# Patient Record
Sex: Female | Born: 1940 | Race: White | Hispanic: No | Smoking: Former smoker
Health system: Southern US, Community
[De-identification: ages and names within clinical notes are randomized; demographics above are authoritative.]

## PROBLEM LIST (undated history)

## (undated) DIAGNOSIS — T7840XA Allergy, unspecified, initial encounter: Secondary | ICD-10-CM

## (undated) DIAGNOSIS — C801 Malignant (primary) neoplasm, unspecified: Secondary | ICD-10-CM

## (undated) DIAGNOSIS — IMO0001 Reserved for inherently not codable concepts without codable children: Secondary | ICD-10-CM

## (undated) DIAGNOSIS — R51 Headache: Secondary | ICD-10-CM

## (undated) DIAGNOSIS — T8859XA Other complications of anesthesia, initial encounter: Secondary | ICD-10-CM

## (undated) DIAGNOSIS — D369 Benign neoplasm, unspecified site: Secondary | ICD-10-CM

## (undated) DIAGNOSIS — D649 Anemia, unspecified: Secondary | ICD-10-CM

## (undated) DIAGNOSIS — F329 Major depressive disorder, single episode, unspecified: Secondary | ICD-10-CM

## (undated) DIAGNOSIS — K219 Gastro-esophageal reflux disease without esophagitis: Secondary | ICD-10-CM

## (undated) DIAGNOSIS — F419 Anxiety disorder, unspecified: Secondary | ICD-10-CM

## (undated) DIAGNOSIS — M48061 Spinal stenosis, lumbar region without neurogenic claudication: Secondary | ICD-10-CM

## (undated) DIAGNOSIS — I209 Angina pectoris, unspecified: Secondary | ICD-10-CM

## (undated) DIAGNOSIS — K279 Peptic ulcer, site unspecified, unspecified as acute or chronic, without hemorrhage or perforation: Secondary | ICD-10-CM

## (undated) DIAGNOSIS — E669 Obesity, unspecified: Secondary | ICD-10-CM

## (undated) DIAGNOSIS — B019 Varicella without complication: Secondary | ICD-10-CM

## (undated) DIAGNOSIS — M1990 Unspecified osteoarthritis, unspecified site: Secondary | ICD-10-CM

## (undated) DIAGNOSIS — I2510 Atherosclerotic heart disease of native coronary artery without angina pectoris: Secondary | ICD-10-CM

## (undated) DIAGNOSIS — E785 Hyperlipidemia, unspecified: Secondary | ICD-10-CM

## (undated) DIAGNOSIS — M797 Fibromyalgia: Secondary | ICD-10-CM

## (undated) DIAGNOSIS — G4730 Sleep apnea, unspecified: Secondary | ICD-10-CM

## (undated) DIAGNOSIS — R519 Headache, unspecified: Secondary | ICD-10-CM

## (undated) DIAGNOSIS — I10 Essential (primary) hypertension: Secondary | ICD-10-CM

## (undated) DIAGNOSIS — S2239XA Fracture of one rib, unspecified side, initial encounter for closed fracture: Secondary | ICD-10-CM

## (undated) DIAGNOSIS — K649 Unspecified hemorrhoids: Secondary | ICD-10-CM

## (undated) DIAGNOSIS — T4145XA Adverse effect of unspecified anesthetic, initial encounter: Secondary | ICD-10-CM

## (undated) DIAGNOSIS — F32A Depression, unspecified: Secondary | ICD-10-CM

## (undated) DIAGNOSIS — I509 Heart failure, unspecified: Secondary | ICD-10-CM

## (undated) DIAGNOSIS — L309 Dermatitis, unspecified: Secondary | ICD-10-CM

## (undated) DIAGNOSIS — R42 Dizziness and giddiness: Secondary | ICD-10-CM

## (undated) DIAGNOSIS — E559 Vitamin D deficiency, unspecified: Secondary | ICD-10-CM

## (undated) DIAGNOSIS — E039 Hypothyroidism, unspecified: Secondary | ICD-10-CM

## (undated) DIAGNOSIS — Z8719 Personal history of other diseases of the digestive system: Secondary | ICD-10-CM

## (undated) HISTORY — PX: CHOLECYSTECTOMY: SHX55

## (undated) HISTORY — PX: EYE SURGERY: SHX253

## (undated) HISTORY — PX: ABDOMINAL HYSTERECTOMY: SHX81

## (undated) HISTORY — DX: Fracture of one rib, unspecified side, initial encounter for closed fracture: S22.39XA

## (undated) HISTORY — PX: RECTOCELE REPAIR: SHX761

## (undated) NOTE — *Deleted (*Deleted)
[place] MEDICAID FL2 LEVEL OF CARE SCREENING TOOL     IDENTIFICATION  Patient Name: Stacy Allen Birthdate: Dec 24, 1940 Sex: female Admission Date (Current Location): 05/06/2020  [place] and [place] Number:  Chiropodist and Address:  [facility], [address], [place], [place] [address]      Provider Number: [id number]  Attending Physician Name and Address:  Lynn Ito, MD  Relative Name and Phone Number:  Victorino Sparrow (Niece) [phone]    Current Level of Care: Hospital Recommended Level of Care: Skilled Nursing Facility Prior Approval Number:    Date Approved/Denied:   PASRR Number:    Discharge Plan: SNF    Current Diagnoses: Patient Active Problem List   Diagnosis Date Noted  . Closed right hip fracture (HCC) 05/06/2020  . Essential hypertension 05/06/2020  . Hypothyroidism 05/06/2020  . Lumbar spondylosis 06/01/2017  . Lumbar degenerative disc disease 06/01/2017  . History of lumbar fusion 06/01/2017  . 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 Chronic pain syndrome 06/01/2017  . SI joint arthritis 06/01/2017    Orientation RESPIRATION BLADDER Height & Weight     Self, Time, Situation, Place  Normal Continent, External catheter Weight: 104.3 kg Height:  5\' 3"  (160 cm)  BEHAVIORAL SYMPTOMS/MOOD NEUROLOGICAL BOWEL NUTRITION STATUS      Continent Diet  AMBULATORY STATUS COMMUNICATION OF NEEDS Skin   Extensive Assist Verbally Normal, Surgical wounds                       Personal Care Assistance Level of Assistance  Bathing, Dressing, Feeding Bathing Assistance: Maximum assistance Feeding assistance: Limited assistance (set up while in bed.) Dressing Assistance: Maximum assistance     Functional Limitations Info  Sight (Glasses) Sight Info: Impaired        SPECIAL CARE FACTORS FREQUENCY  PT (By licensed PT), OT (By licensed OT)     PT Frequency: 5x/week OT Frequency: 5x/week             Contractures Contractures Info: Not present    Additional Factors Info  Code Status, Allergies Code Status Info: Full Code Allergies Info: LATEX and Multiple See specifics: Zithromax, Adhesive tape, Codiene, Lactose intol, morphine related products, red dye, shellfish, Latex, Lodine, Penicillins, Pravastatin           Current Medications (05/09/2020):  This is the current hospital active medication list Current Facility-Administered Medications  Medication Dose Route Frequency Provider Last Rate Last Admin  . 0.9 %  sodium chloride infusion   Intravenous Continuous Lynn Ito, MD 50 mL/hr at 05/09/20 1351 New Bag at 05/09/20 1351  . acetaminophen (TYLENOL) tablet 325-650 mg  325-650 mg Oral Q6H PRN Juanell Fairly, MD      . albuterol (VENTOLIN HFA) 108 (90 Base) MCG/ACT inhaler 2 puff  2 puff Inhalation Q6H PRN Juanell Fairly, MD      . alum & mag hydroxide-simeth (MAALOX/MYLANTA) 200-200-20 MG/5ML suspension 30 mL  30 mL Oral Q4H PRN Juanell Fairly, MD      . bisacodyl (DULCOLAX) suppository 10 mg  10 mg Rectal Daily PRN Juanell Fairly, MD      . Chlorhexidine Gluconate Cloth 2 % PADS 6 each  6 each Topical Daily Juanell Fairly, MD   6 each at 05/09/20 [phone]  . citalopram (CELEXA) tablet 40 mg  40 mg Oral Daily Juanell Fairly, MD   40 mg at 05/09/20 0817  . clonazePAM (KLONOPIN) tablet 1 mg  1 mg Oral BID PRN  Lynn Ito, MD      . docusate sodium (COLACE) capsule 100 mg  100 mg Oral BID Juanell Fairly, MD   100 mg at 05/09/20 0817  . [START ON 05/10/2020] enalapril (VASOTEC) tablet 20 mg  20 mg Oral Daily Amery, Sahar, MD      . enoxaparin (LOVENOX) injection 52.5 mg  0.5 mg/kg Subcutaneous Q24H Juanell Fairly, MD   52.5 mg at 05/09/20 0819  . ferrous sulfate tablet 325 mg  325 mg Oral BID Juanell Fairly, MD   325 mg at 05/09/20 0816  . fluticasone (FLONASE) 50 MCG/ACT nasal spray 2 spray  2 spray Each Nare Daily Juanell Fairly, MD   2 spray at 05/09/20 0818  .  gabapentin (NEURONTIN) capsule 600 mg  600 mg Oral TID Juanell Fairly, MD   600 mg at 05/09/20 1524  . HYDROmorphone (DILAUDID) injection 0.5-1 mg  0.5-1 mg Intravenous Q4H PRN Juanell Fairly, MD      . hydrOXYzine (ATARAX/VISTARIL) tablet 10 mg  10 mg Oral TID PRN Juanell Fairly, MD      . lactated ringers infusion   Intravenous Continuous Juanell Fairly, MD 10 mL/hr at 05/07/20 1338 Restarted at 05/07/20 1638  . levothyroxine (SYNTHROID) tablet 137 mcg  137 mcg Oral QAC breakfast Juanell Fairly, MD   137 mcg at 05/09/20 0556  . lidocaine (LIDODERM) 5 % 1 patch  1 patch Transdermal Q24H Lynn Ito, MD   1 patch at 05/09/20 1200  . loratadine (CLARITIN) tablet 10 mg  10 mg Oral Daily Juanell Fairly, MD   10 mg at 05/09/20 0817  . meclizine (ANTIVERT) tablet 25 mg  25 mg Oral TID PRN Juanell Fairly, MD      . metoprolol tartrate (LOPRESSOR) tablet 12.5 mg  12.5 mg Oral BID Juanell Fairly, MD   12.5 mg at 05/09/20 0817  . mirtazapine (REMERON) tablet 15 mg  15 mg Oral QHS Juanell Fairly, MD   15 mg at 05/08/20 2147  . nitroGLYCERIN (NITROSTAT) SL tablet 0.4 mg  0.4 mg Sublingual Q5 min PRN Juanell Fairly, MD      . ondansetron [facility]) tablet 4 mg  4 mg Oral Q6H PRN Juanell Fairly, MD       Or  . ondansetron [facility]) injection 4 mg  4 mg Intravenous Q6H PRN Juanell Fairly, MD      . oxyCODONE (Oxy IR/ROXICODONE) immediate release tablet 10-15 mg  10-15 mg Oral Q4H PRN Juanell Fairly, MD   10 mg at 05/09/20 0129  . oxyCODONE (Oxy IR/ROXICODONE) immediate release tablet 5-10 mg  5-10 mg Oral Q4H PRN Juanell Fairly, MD   10 mg at 05/09/20 1350  . pantoprazole (PROTONIX) EC tablet 40 mg  40 mg Oral Daily Juanell Fairly, MD   40 mg at 05/09/20 0816  . polyethylene glycol (MIRALAX / GLYCOLAX) packet 17 g  17 g Oral Daily Lynn Ito, MD   17 g at 05/09/20 0818  . polyvinyl alcohol (LIQUIFILM TEARS) 1.4 % ophthalmic solution   Both Eyes Daily Juanell Fairly, MD   Given at  05/08/20 1228  . protein supplement (ENSURE MAX) liquid  11 oz Oral Daily Juanell Fairly, MD   11 oz at 05/09/20 0819  . senna (SENOKOT) tablet 8.6 mg  1 tablet Oral BID Juanell Fairly, MD   8.6 mg at 05/09/20 0817  . sodium phosphate (FLEET) 7-19 GM/118ML enema 1 enema  1 enema Rectal Daily PRN Lynn Ito, MD      . tiZANidine (ZANAFLEX)  tablet 4 mg  4 mg Oral Q6H PRN Juanell Fairly, MD      . Melene Muller ON 05/26/2020] Vitamin D (Ergocalciferol) (DRISDOL) capsule 50,000 Units  50,000 Units Oral Q30 days Juanell Fairly, MD      . zolpidem Remus Loffler) tablet 5 mg  5 mg Oral QHS PRN Juanell Fairly, MD         Discharge Medications: Please see discharge summary for a list of discharge medications.  Relevant Imaging Results:  Relevant Lab Results:   Additional Information    Bing Quarry, RN

---
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
## 1989-06-27 HISTORY — PX: FRACTURE SURGERY: SHX138

## 2004-04-08 ENCOUNTER — Encounter: Payer: Self-pay | Admitting: Pain Medicine

## 2004-04-13 ENCOUNTER — Ambulatory Visit: Payer: Self-pay | Admitting: Pain Medicine

## 2004-04-23 ENCOUNTER — Ambulatory Visit: Payer: Self-pay | Admitting: Internal Medicine

## 2004-04-27 ENCOUNTER — Encounter: Payer: Self-pay | Admitting: Pain Medicine

## 2004-04-27 ENCOUNTER — Ambulatory Visit: Payer: Self-pay | Admitting: Pain Medicine

## 2004-05-12 ENCOUNTER — Ambulatory Visit: Payer: Self-pay | Admitting: Physician Assistant

## 2004-05-27 ENCOUNTER — Ambulatory Visit: Payer: Self-pay | Admitting: Pain Medicine

## 2004-06-03 ENCOUNTER — Ambulatory Visit: Payer: Self-pay | Admitting: Family Medicine

## 2004-06-09 ENCOUNTER — Ambulatory Visit: Payer: Self-pay | Admitting: Physician Assistant

## 2004-06-16 ENCOUNTER — Encounter: Payer: Self-pay | Admitting: Unknown Physician Specialty

## 2004-06-22 ENCOUNTER — Ambulatory Visit: Payer: Self-pay | Admitting: Pain Medicine

## 2004-06-27 ENCOUNTER — Encounter: Payer: Self-pay | Admitting: Unknown Physician Specialty

## 2004-07-06 ENCOUNTER — Ambulatory Visit: Payer: Self-pay | Admitting: Physician Assistant

## 2004-07-28 ENCOUNTER — Encounter: Payer: Self-pay | Admitting: Unknown Physician Specialty

## 2004-08-05 ENCOUNTER — Ambulatory Visit: Payer: Self-pay | Admitting: Physician Assistant

## 2004-08-25 ENCOUNTER — Encounter: Payer: Self-pay | Admitting: Unknown Physician Specialty

## 2004-08-27 ENCOUNTER — Other Ambulatory Visit: Payer: Self-pay

## 2004-08-27 ENCOUNTER — Emergency Department: Payer: Self-pay | Admitting: Emergency Medicine

## 2004-10-11 ENCOUNTER — Ambulatory Visit: Payer: Self-pay | Admitting: Pain Medicine

## 2004-10-14 ENCOUNTER — Ambulatory Visit: Payer: Self-pay | Admitting: Pain Medicine

## 2004-10-28 ENCOUNTER — Ambulatory Visit: Payer: Self-pay | Admitting: Pain Medicine

## 2004-11-11 ENCOUNTER — Ambulatory Visit: Payer: Self-pay | Admitting: Physician Assistant

## 2004-12-13 ENCOUNTER — Ambulatory Visit: Payer: Self-pay | Admitting: Physician Assistant

## 2004-12-16 ENCOUNTER — Ambulatory Visit: Payer: Self-pay | Admitting: Pain Medicine

## 2004-12-29 ENCOUNTER — Ambulatory Visit: Payer: Self-pay | Admitting: Physician Assistant

## 2005-01-26 ENCOUNTER — Ambulatory Visit: Payer: Self-pay | Admitting: Physician Assistant

## 2005-02-24 ENCOUNTER — Ambulatory Visit: Payer: Self-pay | Admitting: Physician Assistant

## 2005-03-01 ENCOUNTER — Ambulatory Visit: Payer: Self-pay | Admitting: Pain Medicine

## 2005-03-03 ENCOUNTER — Ambulatory Visit: Payer: Self-pay | Admitting: Pain Medicine

## 2005-03-09 ENCOUNTER — Ambulatory Visit: Payer: Self-pay | Admitting: Pain Medicine

## 2005-03-21 ENCOUNTER — Ambulatory Visit: Payer: Self-pay | Admitting: Pain Medicine

## 2005-05-06 ENCOUNTER — Emergency Department: Payer: Self-pay | Admitting: Emergency Medicine

## 2005-05-07 ENCOUNTER — Ambulatory Visit: Payer: Self-pay | Admitting: Emergency Medicine

## 2005-05-11 ENCOUNTER — Ambulatory Visit: Payer: Self-pay | Admitting: Pain Medicine

## 2005-05-17 ENCOUNTER — Ambulatory Visit: Payer: Self-pay | Admitting: *Deleted

## 2005-06-06 ENCOUNTER — Ambulatory Visit: Payer: Self-pay | Admitting: Pain Medicine

## 2005-06-06 ENCOUNTER — Inpatient Hospital Stay: Payer: Self-pay | Admitting: Pain Medicine

## 2005-06-20 ENCOUNTER — Emergency Department: Payer: Self-pay | Admitting: Emergency Medicine

## 2005-06-27 HISTORY — PX: BACK SURGERY: SHX140

## 2005-06-28 ENCOUNTER — Ambulatory Visit: Payer: Self-pay | Admitting: Physician Assistant

## 2005-06-28 ENCOUNTER — Ambulatory Visit: Payer: Self-pay | Admitting: *Deleted

## 2005-07-20 ENCOUNTER — Ambulatory Visit: Payer: Self-pay | Admitting: Unknown Physician Specialty

## 2005-07-26 ENCOUNTER — Ambulatory Visit: Payer: Self-pay | Admitting: Surgery

## 2005-08-02 ENCOUNTER — Ambulatory Visit: Payer: Self-pay | Admitting: Surgery

## 2005-08-07 ENCOUNTER — Ambulatory Visit: Payer: Self-pay | Admitting: Psychiatry

## 2005-09-05 ENCOUNTER — Other Ambulatory Visit: Payer: Self-pay

## 2005-09-07 ENCOUNTER — Ambulatory Visit: Payer: Self-pay | Admitting: Psychiatry

## 2005-09-12 ENCOUNTER — Inpatient Hospital Stay: Payer: Self-pay | Admitting: Unknown Physician Specialty

## 2005-10-04 ENCOUNTER — Encounter: Payer: Self-pay | Admitting: Unknown Physician Specialty

## 2005-10-25 ENCOUNTER — Encounter: Payer: Self-pay | Admitting: Unknown Physician Specialty

## 2005-11-25 ENCOUNTER — Encounter: Payer: Self-pay | Admitting: Unknown Physician Specialty

## 2005-12-19 ENCOUNTER — Emergency Department: Payer: Self-pay | Admitting: Emergency Medicine

## 2005-12-19 ENCOUNTER — Other Ambulatory Visit: Payer: Self-pay

## 2005-12-25 ENCOUNTER — Encounter: Payer: Self-pay | Admitting: Unknown Physician Specialty

## 2006-01-25 ENCOUNTER — Encounter: Payer: Self-pay | Admitting: Unknown Physician Specialty

## 2006-01-31 ENCOUNTER — Emergency Department: Payer: Self-pay | Admitting: Emergency Medicine

## 2006-02-25 ENCOUNTER — Encounter: Payer: Self-pay | Admitting: Unknown Physician Specialty

## 2006-09-07 ENCOUNTER — Ambulatory Visit: Payer: Self-pay | Admitting: *Deleted

## 2006-09-19 ENCOUNTER — Emergency Department: Payer: Self-pay | Admitting: Emergency Medicine

## 2006-10-25 ENCOUNTER — Ambulatory Visit: Payer: Self-pay | Admitting: Unknown Physician Specialty

## 2006-12-19 ENCOUNTER — Encounter: Payer: Self-pay | Admitting: Unknown Physician Specialty

## 2006-12-26 ENCOUNTER — Encounter: Payer: Self-pay | Admitting: Unknown Physician Specialty

## 2007-01-09 ENCOUNTER — Ambulatory Visit: Payer: Self-pay | Admitting: Obstetrics and Gynecology

## 2007-01-09 ENCOUNTER — Other Ambulatory Visit: Payer: Self-pay

## 2007-01-15 ENCOUNTER — Other Ambulatory Visit: Payer: Self-pay

## 2007-01-15 ENCOUNTER — Ambulatory Visit: Payer: Self-pay | Admitting: Obstetrics and Gynecology

## 2007-01-26 ENCOUNTER — Encounter: Payer: Self-pay | Admitting: Unknown Physician Specialty

## 2007-03-05 ENCOUNTER — Encounter: Payer: Self-pay | Admitting: Unknown Physician Specialty

## 2007-03-28 ENCOUNTER — Encounter: Payer: Self-pay | Admitting: Unknown Physician Specialty

## 2007-04-07 ENCOUNTER — Ambulatory Visit: Payer: Self-pay | Admitting: Emergency Medicine

## 2007-04-28 ENCOUNTER — Encounter: Payer: Self-pay | Admitting: Unknown Physician Specialty

## 2007-08-14 ENCOUNTER — Ambulatory Visit: Payer: Self-pay | Admitting: Internal Medicine

## 2007-08-14 ENCOUNTER — Other Ambulatory Visit: Payer: Self-pay

## 2007-10-16 ENCOUNTER — Ambulatory Visit: Payer: Self-pay | Admitting: *Deleted

## 2007-11-01 ENCOUNTER — Emergency Department: Payer: Self-pay | Admitting: Emergency Medicine

## 2008-01-18 ENCOUNTER — Ambulatory Visit: Payer: Self-pay | Admitting: Family Medicine

## 2008-01-24 ENCOUNTER — Ambulatory Visit: Payer: Self-pay | Admitting: Unknown Physician Specialty

## 2008-03-04 ENCOUNTER — Ambulatory Visit: Payer: Self-pay | Admitting: Family Medicine

## 2008-08-02 ENCOUNTER — Ambulatory Visit: Payer: Self-pay | Admitting: Family Medicine

## 2008-08-21 ENCOUNTER — Ambulatory Visit: Payer: Self-pay | Admitting: Family Medicine

## 2008-10-16 ENCOUNTER — Ambulatory Visit: Payer: Self-pay | Admitting: Family Medicine

## 2009-01-08 ENCOUNTER — Ambulatory Visit: Payer: Self-pay | Admitting: Unknown Physician Specialty

## 2009-01-15 ENCOUNTER — Emergency Department: Payer: Self-pay | Admitting: Emergency Medicine

## 2009-04-03 ENCOUNTER — Ambulatory Visit: Payer: Self-pay | Admitting: Unknown Physician Specialty

## 2009-07-03 ENCOUNTER — Inpatient Hospital Stay: Payer: Self-pay | Admitting: Internal Medicine

## 2009-08-13 ENCOUNTER — Encounter: Payer: Self-pay | Admitting: Cardiology

## 2009-08-25 ENCOUNTER — Encounter: Payer: Self-pay | Admitting: Cardiology

## 2009-09-24 ENCOUNTER — Inpatient Hospital Stay: Payer: Self-pay | Admitting: Internal Medicine

## 2009-10-28 ENCOUNTER — Ambulatory Visit: Payer: Self-pay | Admitting: Internal Medicine

## 2009-10-28 ENCOUNTER — Ambulatory Visit: Payer: Self-pay | Admitting: Family Medicine

## 2010-01-11 ENCOUNTER — Ambulatory Visit: Payer: Self-pay

## 2010-05-04 ENCOUNTER — Ambulatory Visit: Payer: Self-pay | Admitting: Cardiology

## 2010-09-24 ENCOUNTER — Ambulatory Visit: Payer: Self-pay | Admitting: Unknown Physician Specialty

## 2010-09-29 LAB — PATHOLOGY REPORT

## 2010-11-02 ENCOUNTER — Ambulatory Visit: Payer: Self-pay | Admitting: Family Medicine

## 2011-01-26 ENCOUNTER — Ambulatory Visit: Payer: Self-pay | Admitting: Family Medicine

## 2011-06-07 ENCOUNTER — Ambulatory Visit: Payer: Self-pay | Admitting: Internal Medicine

## 2011-07-20 ENCOUNTER — Encounter: Payer: Self-pay | Admitting: Rheumatology

## 2011-07-29 ENCOUNTER — Encounter: Payer: Self-pay | Admitting: Rheumatology

## 2011-08-13 ENCOUNTER — Ambulatory Visit: Payer: Self-pay | Admitting: Family Medicine

## 2011-11-20 ENCOUNTER — Observation Stay: Payer: Self-pay | Admitting: Specialist

## 2011-11-20 LAB — CK-MB: CK-MB: 0.9 ng/mL (ref 0.5–3.6)

## 2011-11-20 LAB — BASIC METABOLIC PANEL
Calcium, Total: 8.1 mg/dL — ABNORMAL LOW (ref 8.5–10.1)
Chloride: 107 mmol/L (ref 98–107)
Co2: 24 mmol/L (ref 21–32)
Creatinine: 0.62 mg/dL (ref 0.60–1.30)
EGFR (Non-African Amer.): >60
Potassium: 4 mmol/L (ref 3.5–5.1)
Sodium: 140 mmol/L (ref 136–145)

## 2011-11-20 LAB — CBC
HCT: 37 % (ref 35.0–47.0)
MCH: 30.7 pg (ref 26.0–34.0)
MCV: 92 fL (ref 80–100)
Platelet: 222 10*3/uL (ref 150–440)
RDW: 12.9 % (ref 11.5–14.5)
WBC: 6.5 10*3/uL (ref 3.6–11.0)

## 2011-11-20 LAB — CK TOTAL AND CKMB (NOT AT ARMC)
CK, Total: 54 U/L (ref 21–215)
CK-MB: 0.9 ng/mL (ref 0.5–3.6)

## 2011-11-20 LAB — TROPONIN I: Troponin-I: <0.02 ng/mL

## 2011-11-21 LAB — TROPONIN I: Troponin-I: 0.03 ng/mL

## 2011-11-27 ENCOUNTER — Ambulatory Visit: Payer: Self-pay

## 2012-01-11 ENCOUNTER — Ambulatory Visit: Payer: Self-pay | Admitting: Family Medicine

## 2012-04-06 ENCOUNTER — Emergency Department: Payer: Self-pay | Admitting: Emergency Medicine

## 2012-04-11 ENCOUNTER — Ambulatory Visit: Payer: Self-pay | Admitting: Family Medicine

## 2012-05-01 ENCOUNTER — Encounter: Payer: Self-pay | Admitting: Family Medicine

## 2012-05-27 ENCOUNTER — Encounter: Payer: Self-pay | Admitting: Family Medicine

## 2012-05-29 ENCOUNTER — Ambulatory Visit: Payer: Self-pay | Admitting: Family Medicine

## 2012-06-27 ENCOUNTER — Encounter: Payer: Self-pay | Admitting: Family Medicine

## 2012-09-13 ENCOUNTER — Emergency Department: Payer: Self-pay | Admitting: Emergency Medicine

## 2012-09-13 ENCOUNTER — Ambulatory Visit: Payer: Self-pay | Admitting: Nurse Practitioner

## 2012-09-13 LAB — COMPREHENSIVE METABOLIC PANEL
Anion Gap: 9 (ref 7–16)
Bilirubin,Total: 0.6 mg/dL (ref 0.2–1.0)
Calcium, Total: 8.6 mg/dL (ref 8.5–10.1)
Co2: 24 mmol/L (ref 21–32)
Glucose: 106 mg/dL — ABNORMAL HIGH (ref 65–99)
Osmolality: 271 (ref 275–301)
Potassium: 3.3 mmol/L — ABNORMAL LOW (ref 3.5–5.1)
SGOT(AST): 15 U/L (ref 15–37)
SGPT (ALT): 19 U/L (ref 12–78)
Sodium: 136 mmol/L (ref 136–145)

## 2012-09-13 LAB — URINALYSIS, COMPLETE
Glucose,UR: NEGATIVE mg/dL (ref 0–75)
Ketone: NEGATIVE
Leukocyte Esterase: NEGATIVE
Ph: 6 (ref 4.5–8.0)
RBC,UR: 1 /HPF (ref 0–5)
Specific Gravity: 1.003 (ref 1.003–1.030)
Squamous Epithelial: 1

## 2012-09-13 LAB — CK TOTAL AND CKMB (NOT AT ARMC): CK, Total: 54 U/L (ref 21–215)

## 2012-09-13 LAB — CBC
MCV: 90 fL (ref 80–100)
RBC: 4.16 10*6/uL (ref 3.80–5.20)
RDW: 14 % (ref 11.5–14.5)

## 2012-11-19 ENCOUNTER — Ambulatory Visit: Payer: Self-pay | Admitting: Family Medicine

## 2012-12-04 ENCOUNTER — Ambulatory Visit: Payer: Self-pay | Admitting: Neurology

## 2013-04-26 ENCOUNTER — Ambulatory Visit: Payer: Self-pay | Admitting: Physician Assistant

## 2013-06-27 HISTORY — PX: CORONARY ANGIOPLASTY: SHX604

## 2013-07-09 ENCOUNTER — Ambulatory Visit: Payer: Self-pay | Admitting: Family Medicine

## 2013-07-15 ENCOUNTER — Ambulatory Visit: Payer: Self-pay | Admitting: Unknown Physician Specialty

## 2013-08-01 ENCOUNTER — Ambulatory Visit: Payer: Self-pay

## 2014-07-10 ENCOUNTER — Ambulatory Visit: Payer: Self-pay | Admitting: Nurse Practitioner

## 2014-07-23 ENCOUNTER — Ambulatory Visit: Payer: Self-pay

## 2014-07-23 LAB — URINALYSIS, COMPLETE
BACTERIA: NEGATIVE
BILIRUBIN, UR: NEGATIVE
Blood: NEGATIVE
Glucose,UR: NEGATIVE
Ketone: NEGATIVE
Leukocyte Esterase: NEGATIVE
Nitrite: NEGATIVE
PROTEIN: NEGATIVE
Ph: 6.5 (ref 5.0–8.0)
Specific Gravity: 1.005 (ref 1.000–1.030)
WBC UR: NONE SEEN /HPF (ref 0–5)

## 2014-08-25 ENCOUNTER — Ambulatory Visit: Payer: Self-pay | Admitting: Family Medicine

## 2014-10-19 NOTE — Discharge Summary (Signed)
PATIENT NAME:  Stacy Allen, CLONINGER MR#:  [id number] DATE OF BIRTH:  11-Jun-1941  DATE OF ADMISSION:  11/20/2011 DATE OF DISCHARGE:  11/21/2011  HISTORY: For a detailed note, please see the history and physical done by Dr. Fulton Reek on admission.   DISCHARGE DIAGNOSES:  1. Chest pain, likely related to anxiety and also musculoskeletal in nature.  2. History of coronary artery disease, status post stent placement. 3. Hypertension. 4. Hyperlipidemia. 5. Hypothyroidism. 6. Fibromyalgia. 7. Gastroesophageal reflux disease.   DIET: The patient is being discharged to home on a low-sodium, low-fat diet.   ACTIVITY: As tolerated.   FOLLOW-UP: Follow-up in the next one to two weeks with Dr. Bartholome Bill.   DISCHARGE MEDICATIONS:  1. Amlodipine 5 mg daily.  2. Aspirin 325 milligrams daily.  3. Celexa 60 mg daily.  4. Klonopin 1/2 tab to 1 tab 1 mg tab b.i.d.  5. Enalapril 20 mg daily.  6. Gabapentin 300 mg t.i.d.  7. Synthroid 125 mcg daily.  8. Metoprolol succinate 12.5 mg b.i.d.  9. Sublingual nitroglycerin as needed.  10. Omeprazole 20 mg b.i.d.  11. Pravachol 40 mg daily.   CONSULTANTS DURING THE HOSPITAL COURSE: Dr. Bartholome Bill from cardiology.   PERTINENT STUDIES DONE DURING THE HOSPITAL COURSE: Chest x-ray done on admission showing no acute cardiopulmonary disease. Nuclear medicine stress test done on 05/27 showing negative exercise treadmill stress test. LV function normal. No evidence of infarction or ischemia.   HOSPITAL COURSE: This is a 74 year old female with medical problems as mentioned above who presented to the hospital with chest pain.  1. Chest pain: Given the patient's history of previous coronary disease and stent placement, she was observed overnight on telemetry, had three sets of cardiac markers checked which were negative. The patient underwent a stress test which was essentially normal. She currently is chest pain free and hemodynamically stable, therefore, is  being discharged home. She was seen by cardiology who agrees with this management and will follow her up as an outpatient.  2. Hypertension: The patient will resume her enalapril and metoprolol upon discharge. She was maintained on that.  3. Hyperlipidemia: The patient was maintained on Pravachol. She will resume that.  4. Hypothyroidism. The patient was maintained on her Synthroid. She will resume that upon discharge.  5. Gastroesophageal reflux disease: The patient was maintained on her omeprazole. She will resume that upon discharge too.  6. Fibromyalgia. The patient will resume her gabapentin upon discharge.  7. Depression/anxiety: The patient was maintained on Celexa and Klonopin. She will resume that upon discharge.  8. The patient was also complaining of some sinus congestion and headache. The headache was likely related to nitroglycerin. I did recommend that she needs to follow up with her ENT physician for her chronic sinus congestion.   CODE STATUS: The patient is a FULL CODE.   TIME SPENT: 35 minutes.   ____________________________ Belia Heman. Verdell Carmine, MD vjs:ap D: 11/21/2011 14:12:11 ET T: 11/22/2011 10:16:22 ET JOB#: [id number] cc: Belia Heman. Verdell Carmine, MD, <Dictator> Javier Docker. Ubaldo Glassing, MD Henreitta Leber MD ELECTRONICALLY SIGNED 11/22/2011 15:29

## 2014-10-19 NOTE — H&P (Signed)
PATIENT NAME:  Stacy Allen, Stacy Allen MR#:  [id number] DATE OF BIRTH:  Dec 19, 1940  DATE OF ADMISSION:  11/20/2011  REFERRING PHYSICIAN: Dr. Ulice Brilliant    FAMILY PHYSICIAN: [place]: Chest pain worrisome for unstable angina.   HISTORY OF PRESENT ILLNESS: The patient is a 74 year old female with a history of chronic pain, fibromyalgia, as well as anxiety and depression. She does have a history of coronary artery disease status post PTCA with stent placement of the right coronary artery in January of 2011. Two subsequent cardiac catheterizations have shown patency of the stent with no further progression of her underlying coronary artery disease. She presents to the Emergency Room today with chest pain radiating through to the upper left back with some aching in her left arm associated with shortness of breath. The pain was improved with sublingual nitroglycerin. In the Emergency Room, the patient was noted to be mildly bradycardic but in no acute distress. EKG showed no acute ischemic changes. Initial enzymes were negative. She is now admitted for further evaluation.   PAST MEDICAL HISTORY:  1. Atherosclerotic cardiovascular disease status post PTCA with stent placement of the right coronary artery in January of 2011.  2. Fibromyalgia.  3. Anxiety/depression.  4. Chronic pain syndrome.  5. Osteoarthritis.  6. Gastroesophageal reflux disease.  7. Benign hypertension.  8. Hyperlipidemia.  9. Irritable bowel syndrome.  10. Hypothyroidism.  11. Interstitial cystitis.  12. Status post hysterectomy.  13. Status post appendectomy.  14. Status post cholecystectomy.  15. Status post lumbar diskectomy.   MEDICATIONS:  1. Pravastatin 40 mg p.o. at bedtime.  2. Prilosec 20 mg p.o. b.i.d.  3. Nitrostat 0.4 mg sublingually p.r.n. chest pain.  4. Toprol-XL 12.5 mg p.o. b.i.d.  5. Synthroid 0.137 mg p.o. daily.  6. Neurontin 300 mg p.o. t.i.d.  7. Vasotec 20 mg p.o. daily.   8. Klonopin 0.5 mg p.o. b.i.d.  9. Celexa 60 mg p.o. at bedtime.  10. Norvasc 5 mg p.o. daily.  11. Aspirin 325 mg p.o. daily.  12. Ambien 10 mg p.o. at bedtime.   ALLERGIES: Zithromax, red food dye, codeine, morphine, iodine, tape, latex, and tramadol.   SOCIAL HISTORY: The patient has a remote history of tobacco abuse. No history of alcohol abuse.   FAMILY HISTORY: Positive for diabetes, coronary artery disease, congestive heart failure, and muscular dystrophy. Also positive for anal cancer.   REVIEW OF SYSTEMS: CONSTITUTIONAL: No fever or change in weight. EYES: No blurred or double vision. No glaucoma. ENT: No tinnitus or hearing loss. No nasal discharge or bleeding. No difficulty swallowing. RESPIRATORY: No cough or wheezing. Denies hemoptysis. No painful respiration. CARDIOVASCULAR: No orthopnea or palpitations. No syncope. GI: No nausea, vomiting, or diarrhea. No abdominal pain. No change in bowel habits. GU: No dysuria or hematuria. No incontinence. ENDOCRINE: No polyuria or polydipsia. No heat or cold intolerance. HEMATOLOGIC: The patient denies anemia, easy bruising, or bleeding. LYMPHATIC: No swollen glands. MUSCULOSKELETAL: The patient denies pain in her neck, knees, or hips. Does have back and shoulder pain. No gout. NEUROLOGIC: No numbness. Has generalized weakness. Denies migraines, stroke, or seizures. PSYCH: The patient denies anxiety, insomnia, or depression.     PHYSICAL EXAMINATION:   GENERAL: The patient is in no acute distress.   VITAL SIGNS: Vital signs are currently remarkable for a blood pressure of 138/78 with a heart rate of 59 and a respiratory rate of 18. She is afebrile.   HEENT: Normocephalic, atraumatic. Pupils equally round and  reactive to light and accommodation. Extraocular movements are intact. Sclerae are nonicteric. Conjunctivae are clear. Oropharynx is clear.   NECK: Supple without JVD or bruits. No adenopathy or thyromegaly is noted.   LUNGS: Clear  to auscultation and percussion without wheezes, rales, or rhonchi. No dullness.   CARDIAC: Regular rate and rhythm. Normal S1, S2. No significant rubs, murmurs, or gallops. PMI is nondisplaced. Chest wall is nontender.   ABDOMEN: Soft, nontender with normoactive bowel sounds. No organomegaly or mass were appreciated. No hernias or bruits were noted.   EXTREMITIES: Without clubbing, cyanosis, edema. Pulses were 2+ bilaterally.   SKIN: Warm and dry without rash or lesions.   NEUROLOGIC: Cranial nerves II through XII grossly intact. Deep tendon reflexes were symmetric. Motor and sensory exams nonfocal.   PSYCH: The patient was alert and oriented to person, place, and time. She was cooperative and used good judgment.   LABORATORY, DIAGNOSTIC, AND RADIOLOGICAL DATA: CBC was within normal limits. Glucose was 93 with a BUN of 15 and a creatinine of 0.62 with a sodium of 140 and a potassium of 4.0. Total CK 54 with an MB of 0.9 and a troponin of less than 0.02.   Chest x-ray was unremarkable.   EKG revealed sinus bradycardia at 57 beats per minute with no acute ischemic changes.   ASSESSMENT:  1. Chest pain worrisome for unstable angina.  2. Bradycardia.  3. Atherosclerotic cardiovascular disease status post previous PTCA and stent placement of the right coronary artery.  4. Hyperlipidemia.  5. Hypothyroidism.  6. Fibromyalgia.  7. Chronic pain.   PLAN:  1. The patient will be observed on telemetry with Lovenox and aspirin.  2. Will begin topical nitrates and continue her outpatient regimen.  3. Will follow serial cardiac enzymes.  4. Will obtain a Cardiology consult for further evaluation of her chest pain syndrome.  5. Further treatment and evaluation will depend upon the patient's progress.   TOTAL TIME SPENT ON THIS PATIENT: 45 minutes.   ____________________________ Leonie Douglas Doy Hutching, MD jds:drc D: 11/20/2011 14:37:32 ET T: 11/20/2011 14:55:35 ET JOB#: [id number]  cc: Leonie Douglas.  Doy Hutching, MD, <Dictator> [facility] Anglia Blakley Lennice Sites MD ELECTRONICALLY SIGNED 11/20/2011 16:50

## 2014-10-23 ENCOUNTER — Ambulatory Visit
Admit: 2014-10-23 | Disposition: A | Discharge status: Home / Self Care | Payer: Self-pay | Attending: Nurse Practitioner | Admitting: Nurse Practitioner

## 2015-03-30 ENCOUNTER — Other Ambulatory Visit: Payer: Self-pay | Admitting: Cardiology

## 2015-04-02 ENCOUNTER — Encounter: Payer: Self-pay | Admitting: *Deleted

## 2015-04-03 ENCOUNTER — Encounter: Payer: Self-pay | Admitting: *Deleted

## 2015-04-03 ENCOUNTER — Ambulatory Visit
Admission: RE | Admit: 2015-04-03 | Discharge: 2015-04-03 | Disposition: A | Discharge status: Home / Self Care | Payer: Medicare HMO | Source: Ambulatory Visit | Attending: Cardiology | Admitting: Cardiology

## 2015-04-03 ENCOUNTER — Encounter
Admission: RE | Disposition: A | Discharge status: Home / Self Care | Payer: Self-pay | Source: Ambulatory Visit | Attending: Cardiology

## 2015-04-03 DIAGNOSIS — Z955 Presence of coronary angioplasty implant and graft: Secondary | ICD-10-CM | POA: Insufficient documentation

## 2015-04-03 DIAGNOSIS — I119 Hypertensive heart disease without heart failure: Secondary | ICD-10-CM | POA: Insufficient documentation

## 2015-04-03 DIAGNOSIS — Z91048 Other nonmedicinal substance allergy status: Secondary | ICD-10-CM | POA: Insufficient documentation

## 2015-04-03 DIAGNOSIS — G4730 Sleep apnea, unspecified: Secondary | ICD-10-CM | POA: Diagnosis not present

## 2015-04-03 DIAGNOSIS — Z9049 Acquired absence of other specified parts of digestive tract: Secondary | ICD-10-CM | POA: Diagnosis not present

## 2015-04-03 DIAGNOSIS — Z8261 Family history of arthritis: Secondary | ICD-10-CM | POA: Insufficient documentation

## 2015-04-03 DIAGNOSIS — Z87891 Personal history of nicotine dependence: Secondary | ICD-10-CM | POA: Insufficient documentation

## 2015-04-03 DIAGNOSIS — K219 Gastro-esophageal reflux disease without esophagitis: Secondary | ICD-10-CM | POA: Diagnosis not present

## 2015-04-03 DIAGNOSIS — Z881 Allergy status to other antibiotic agents status: Secondary | ICD-10-CM | POA: Insufficient documentation

## 2015-04-03 DIAGNOSIS — Z79891 Long term (current) use of opiate analgesic: Secondary | ICD-10-CM | POA: Diagnosis not present

## 2015-04-03 DIAGNOSIS — K449 Diaphragmatic hernia without obstruction or gangrene: Secondary | ICD-10-CM | POA: Diagnosis not present

## 2015-04-03 DIAGNOSIS — R0602 Shortness of breath: Secondary | ICD-10-CM | POA: Diagnosis present

## 2015-04-03 DIAGNOSIS — M797 Fibromyalgia: Secondary | ICD-10-CM | POA: Diagnosis not present

## 2015-04-03 DIAGNOSIS — Z6836 Body mass index (BMI) 36.0-36.9, adult: Secondary | ICD-10-CM | POA: Insufficient documentation

## 2015-04-03 DIAGNOSIS — E669 Obesity, unspecified: Secondary | ICD-10-CM | POA: Insufficient documentation

## 2015-04-03 DIAGNOSIS — F419 Anxiety disorder, unspecified: Secondary | ICD-10-CM | POA: Insufficient documentation

## 2015-04-03 DIAGNOSIS — R42 Dizziness and giddiness: Secondary | ICD-10-CM | POA: Insufficient documentation

## 2015-04-03 DIAGNOSIS — Z91013 Allergy to seafood: Secondary | ICD-10-CM | POA: Insufficient documentation

## 2015-04-03 DIAGNOSIS — Z885 Allergy status to narcotic agent status: Secondary | ICD-10-CM | POA: Insufficient documentation

## 2015-04-03 DIAGNOSIS — Z90710 Acquired absence of both cervix and uterus: Secondary | ICD-10-CM | POA: Diagnosis not present

## 2015-04-03 DIAGNOSIS — F329 Major depressive disorder, single episode, unspecified: Secondary | ICD-10-CM | POA: Insufficient documentation

## 2015-04-03 DIAGNOSIS — Z9889 Other specified postprocedural states: Secondary | ICD-10-CM | POA: Diagnosis not present

## 2015-04-03 DIAGNOSIS — Z8489 Family history of other specified conditions: Secondary | ICD-10-CM | POA: Diagnosis not present

## 2015-04-03 DIAGNOSIS — R079 Chest pain, unspecified: Secondary | ICD-10-CM | POA: Insufficient documentation

## 2015-04-03 DIAGNOSIS — E785 Hyperlipidemia, unspecified: Secondary | ICD-10-CM | POA: Diagnosis not present

## 2015-04-03 DIAGNOSIS — Z833 Family history of diabetes mellitus: Secondary | ICD-10-CM | POA: Insufficient documentation

## 2015-04-03 DIAGNOSIS — Z85828 Personal history of other malignant neoplasm of skin: Secondary | ICD-10-CM | POA: Insufficient documentation

## 2015-04-03 DIAGNOSIS — I2510 Atherosclerotic heart disease of native coronary artery without angina pectoris: Secondary | ICD-10-CM | POA: Insufficient documentation

## 2015-04-03 DIAGNOSIS — Z91011 Allergy to milk products: Secondary | ICD-10-CM | POA: Diagnosis not present

## 2015-04-03 DIAGNOSIS — Z800 Family history of malignant neoplasm of digestive organs: Secondary | ICD-10-CM | POA: Insufficient documentation

## 2015-04-03 DIAGNOSIS — Z7951 Long term (current) use of inhaled steroids: Secondary | ICD-10-CM | POA: Insufficient documentation

## 2015-04-03 DIAGNOSIS — Z808 Family history of malignant neoplasm of other organs or systems: Secondary | ICD-10-CM | POA: Insufficient documentation

## 2015-04-03 DIAGNOSIS — E039 Hypothyroidism, unspecified: Secondary | ICD-10-CM | POA: Insufficient documentation

## 2015-04-03 DIAGNOSIS — Z8249 Family history of ischemic heart disease and other diseases of the circulatory system: Secondary | ICD-10-CM | POA: Diagnosis not present

## 2015-04-03 DIAGNOSIS — M1990 Unspecified osteoarthritis, unspecified site: Secondary | ICD-10-CM | POA: Insufficient documentation

## 2015-04-03 HISTORY — DX: Depression, unspecified: F32.A

## 2015-04-03 HISTORY — DX: Dizziness and giddiness: R42

## 2015-04-03 HISTORY — DX: Varicella without complication: B01.9

## 2015-04-03 HISTORY — DX: Headache, unspecified: R51.9

## 2015-04-03 HISTORY — DX: Headache: R51

## 2015-04-03 HISTORY — DX: Dermatitis, unspecified: L30.9

## 2015-04-03 HISTORY — DX: Reserved for inherently not codable concepts without codable children: IMO0001

## 2015-04-03 HISTORY — DX: Hypercalcemia: E83.52

## 2015-04-03 HISTORY — DX: Personal history of other diseases of the digestive system: Z87.19

## 2015-04-03 HISTORY — DX: Spinal stenosis, lumbar region without neurogenic claudication: M48.061

## 2015-04-03 HISTORY — DX: Benign neoplasm, unspecified site: D36.9

## 2015-04-03 HISTORY — PX: CARDIAC CATHETERIZATION: SHX172

## 2015-04-03 HISTORY — DX: Allergy, unspecified, initial encounter: T78.40XA

## 2015-04-03 HISTORY — DX: Angina pectoris, unspecified: I20.9

## 2015-04-03 HISTORY — DX: Major depressive disorder, single episode, unspecified: F32.9

## 2015-04-03 HISTORY — DX: Essential (primary) hypertension: I10

## 2015-04-03 HISTORY — DX: Fibromyalgia: M79.7

## 2015-04-03 HISTORY — DX: Atherosclerotic heart disease of native coronary artery without angina pectoris: I25.10

## 2015-04-03 HISTORY — DX: Hypothyroidism, unspecified: E03.9

## 2015-04-03 HISTORY — DX: Anxiety disorder, unspecified: F41.9

## 2015-04-03 HISTORY — DX: Obesity, unspecified: E66.9

## 2015-04-03 HISTORY — DX: Unspecified hemorrhoids: K64.9

## 2015-04-03 HISTORY — DX: Hyperlipidemia, unspecified: E78.5

## 2015-04-03 HISTORY — DX: Malignant (primary) neoplasm, unspecified: C80.1

## 2015-04-03 HISTORY — DX: Gastro-esophageal reflux disease without esophagitis: K21.9

## 2015-04-03 HISTORY — DX: Peptic ulcer, site unspecified, unspecified as acute or chronic, without hemorrhage or perforation: K27.9

## 2015-04-03 HISTORY — DX: Unspecified osteoarthritis, unspecified site: M19.90

## 2015-04-03 HISTORY — DX: Sleep apnea, unspecified: G47.30

## 2015-04-03 SURGERY — LEFT HEART CATH AND CORONARY ANGIOGRAPHY
Anesthesia: Moderate Sedation

## 2015-04-03 SURGERY — LEFT HEART CATH
Anesthesia: Moderate Sedation

## 2015-04-03 MED ORDER — FENTANYL CITRATE (PF) 100 MCG/2ML IJ SOLN
INTRAMUSCULAR | Status: AC
  Filled 2015-04-03: qty 2

## 2015-04-03 MED ORDER — SODIUM CHLORIDE 0.9 % IJ SOLN: 3.0000 mL | INTRAMUSCULAR | Status: DC | PRN

## 2015-04-03 MED ORDER — ASPIRIN 81 MG PO CHEW: 81.0000 mg | CHEWABLE_TABLET | ORAL | Status: DC

## 2015-04-03 MED ORDER — MIDAZOLAM HCL 2 MG/2ML IJ SOLN
INTRAMUSCULAR | Status: AC
Start: 2015-04-03 — End: 2015-04-03
  Filled 2015-04-03: qty 2

## 2015-04-03 MED ORDER — ACETAMINOPHEN 325 MG PO TABS
ORAL_TABLET | ORAL | Status: AC
  Filled 2015-04-03: qty 2

## 2015-04-03 MED ORDER — HEPARIN (PORCINE) IN NACL 2-0.9 UNIT/ML-% IJ SOLN
INTRAMUSCULAR | Status: AC
  Filled 2015-04-03: qty 1000

## 2015-04-03 MED ORDER — SODIUM CHLORIDE 0.9 % IJ SOLN: 3.0000 mL | Freq: Two times a day (BID) | INTRAMUSCULAR | Status: DC

## 2015-04-03 MED ORDER — SODIUM CHLORIDE 0.9 % IV SOLN: 250.0000 mL | INTRAVENOUS | Status: DC | PRN

## 2015-04-03 MED ORDER — SODIUM CHLORIDE 0.9 % WEIGHT BASED INFUSION: 3.0000 mL/kg/h | INTRAVENOUS | Status: DC

## 2015-04-03 MED ORDER — SODIUM CHLORIDE 0.9 % WEIGHT BASED INFUSION: 1.0000 mL/kg/h | INTRAVENOUS | Status: DC

## 2015-04-03 MED ORDER — SODIUM CHLORIDE 0.9 % IV SOLN: INTRAVENOUS | Status: DC

## 2015-04-03 MED ORDER — MIDAZOLAM HCL 2 MG/2ML IJ SOLN
INTRAMUSCULAR | Status: DC | PRN
  Administered 2015-04-03: 0.5 mg via INTRAVENOUS

## 2015-04-03 MED ORDER — SODIUM CHLORIDE 0.9 % IV SOLN
INTRAVENOUS | Status: DC
  Administered 2015-04-03: 10:00:00 via INTRAVENOUS

## 2015-04-03 MED ORDER — ACETAMINOPHEN 325 MG PO TABS
650.0000 mg | ORAL_TABLET | Freq: Once | ORAL | Status: AC
  Administered 2015-04-03: 650 mg via ORAL

## 2015-04-03 MED ORDER — FENTANYL CITRATE (PF) 100 MCG/2ML IJ SOLN
INTRAMUSCULAR | Status: DC | PRN
  Administered 2015-04-03: 25 ug via INTRAVENOUS

## 2015-04-03 SURGICAL SUPPLY — 9 items
CATH INFINITI 5FR ANG PIGTAIL (CATHETERS) ×2 IMPLANT
CATH INFINITI 5FR JL4 (CATHETERS) ×2 IMPLANT
CATH INFINITI JR4 5F (CATHETERS) ×2 IMPLANT
DEVICE CLOSURE MYNXGRIP 5F (Vascular Products) ×2 IMPLANT
KIT MANI 3VAL PERCEP (MISCELLANEOUS) ×2 IMPLANT
NEEDLE PERC 18GX7CM (NEEDLE) ×2 IMPLANT
PACK CARDIAC CATH (CUSTOM PROCEDURE TRAY) ×2 IMPLANT
SHEATH AVANTI 5FR X 11CM (SHEATH) ×2 IMPLANT
WIRE EMERALD 3MM-J .035X150CM (WIRE) ×2 IMPLANT

## 2015-04-03 NOTE — H&P (Signed)
Chief Complaint: Chief Complaint  Patient presents with  . Follow-up  testing(x ray and carotid)  Date of Service: 03/30/2015 Date of Birth: Jun 24, 1941 PCP: Thyra Breed, MD (Inactive)  History of Present Illness: Stacy Allen is a 74 y.o.female patient who returns for follow-up visit. Has a history of shortness of breath and chest discomfort. Cardiac catheterization done in 2011 revealed patent stent in the RCA with no significant progression of LAD disease. Patient has multiple complaints of pain weakness fatigue shortness of breath, diaphoresis lately dizziness. EKG reveals sinus rhythm at a rate of 84. PR interval 172 milliseconds with a QRS duration of 96 milliseconds QTC of 463 milliseconds with a QRS axis of 27. There is no ischemia. Patient continues to have exertional chest pain with radiation to her neck and jaw with some diaphoresis. She underwent carotid evaluation which revealed insignificant carotid disease. She continues to have symptoms which are limiting her activity. Her functional study was unremarkable. She does have a history of coronary artery disease with a stent in her RCA had a stent in her LAD. Past Medical and Surgical History  Past Medical History Past Medical History  Diagnosis Date  . Adenomatous polyps  Colonoscopy 09/24/2010  . Allergic state  . Anxiety  . Arthritis  . Chest pain  a) EKG 12/29/2005 shows normal sinus rhythm, normal EKG. b) ETT myoview 12/20/2005 shows LV function normal, EF 65%, low probability for ischemia, good exercise tolerance for age. c) Cardiac catheterization on January mid RCA stenosis, with insignificant disease in the LAD. d) PCI of RCA. e) Carodiac catheterization on 09/26/2009 revealed stent with no progression of LAD disease  . Chicken pox  . Depression  . Eczema  . Fibromyalgia  . GERD (gastroesophageal reflux disease)  . Hemorrhoids  . Hiatal hernia  . Hypercalcemia  . Hyperlipidemia  . Hypertension  . Hypothyroid  . 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 Migraines  . Obesity  . Osteoarthritis  a) lumbar spine. b) rotator cuff tear  . Osteoporosis, post-menopausal  . Peptic ulcer disease  . Skin cancer  . Sleep apnea  . Vertigo   Past Surgical History She has a past surgical history that includes Hysterectomy; Femur fracture surgery; Cholecystectomy; Back surgery (2007); Repair rectocele; Coronary angioplasty; Stent placed ; Knee arthroscopy; Right oophorectomy; Colonoscopy x2, the last 09/24/2010; Upper endoscopy x2, the last 09/24/2010; and Cardiac cath.   Medications and Allergies  Current Medications  Current Outpatient Prescriptions  Medication Sig Dispense Refill  . acetaminophen (TYLENOL) 500 MG tablet Take 500 mg by mouth as needed.  [name] albuterol 90 mcg/actuation inhaler Inhale 2 inhalations into the lungs every 6 (six) hours as needed for Wheezing.  [name] amLODIPine (NORVASC) 5 MG tablet Take 5 mg by mouth 2 (two) times daily.  . celecoxib (CELEBREX) 200 MG capsule Take 200 mg by mouth as needed.  . clonazePAM (KLONOPIN) 1 MG tablet Take 1 mg by mouth 2 (two) times daily as needed for Anxiety.  . enalapril (VASOTEC) 20 MG tablet Take 20 mg by mouth daily.  . ergocalciferol, vitamin D2, 50,000 unit capsule Take 50,000 Units by mouth monthly.  . fexofenadine (ALLEGRA) 180 MG tablet Take 180 mg by mouth once daily.  . fluticasone (FLONASE) 50 mcg/actuation nasal spray Place 2 sprays into both nostrils once daily.  [name] gabapentin (NEURONTIN) 300 MG capsule Take 600 mg by mouth 3 (three) times daily.  [name] HYDROcodone-acetaminophen (NORCO) 5-325 mg tablet Take 1 tablet by mouth every 4 (  four) hours as needed for Pain for up to 40 doses. May take 1-2 tablets every 4-6 hours, if more pain control is needed. 40 tablet 0  . levothyroxine (SYNTHROID, LEVOTHROID) 125 MCG tablet Take 125 mcg by mouth once daily. Take on an empty stomach with a glass of water at least 30-60 minutes before breakfast.   . meclizine (ANTIVERT) 25 mg tablet Take 25 mg by mouth 3 (three) times daily as needed for Dizziness.  . metoprolol tartrate (LOPRESSOR) 25 MG tablet 25 mg.  . nitroglycerin (NITROSTAT) 0.4 MG SL tablet Place 0.4 mg under the tongue as needed. May take up to 3 doses.  [name] omeprazole (PRILOSEC) 20 MG DR capsule Take 20 mg by mouth 2 (two) times daily.  . phenazopyridine (AZO) 95 MG tablet Take 95 mg by mouth 3 (three) times daily as needed for Pain.  . pravastatin (PRAVACHOL) 40 MG tablet Take 40 mg by mouth nightly.  . citalopram (CELEXA) 40 MG tablet Take 1 tablet (40 mg total) by mouth daily. 90 tablet 3  . metoprolol tartrate (LOPRESSOR) 25 MG tablet Take 0.5 tablets (12.5 mg total) by mouth 2 (two) times daily. 90 tablet 3   No current facility-administered medications for this visit.   Allergies: Codeine; Lactose; Lodine [etodolac]; Morphine; Red food color (bulk); Shellfish containing products; Tape 1"x5yd Dorma Russell tape]; and Zithromax [azithromycin]  Social and Family History  Social History reports that she quit smoking about 13 years ago. She has a 22.50 pack-year smoking history. She has quit using smokeless tobacco. She reports that she drinks alcohol. She reports that she does not use illicit drugs.  Family History Family History  Problem Relation Age of Onset  . Hypertension Mother  . Osteoarthritis Mother  . Osteoporosis Mother  . Colon cancer Mother  . Depression Mother  . Skin cancer Mother  . Obesity Mother  . Allergies Mother  . Diabetes type II Father  . Hypertension Father  . Obesity Father  . Muscular dystrophy Father  . Heart failure Father  . Allergies Father  . Heart attack Father  . Coronary artery disease Sister  . Depression Sister  . Hypertension Sister  . Heart attack Sister  . Coronary artery disease Brother  . Prostate cancer Brother  . Depression Brother  . Anxiety disorder Brother  . Hypertension Daughter  . Allergies Other  siblings  .  Heart attack Other  sibling  . Mental illness Other  sibling   Review of Systems  Review of Systems  Constitutional: Positive for diaphoresis and malaise/fatigue. Negative for chills, fever and weight loss.  HENT: Negative for congestion, ear discharge, hearing loss and tinnitus.  Eyes: Negative for blurred vision.  Respiratory: Positive for shortness of breath. Negative for hemoptysis and sputum production.  Cardiovascular: Positive for chest pain. Negative for palpitations, orthopnea, claudication, leg swelling and PND.  Gastrointestinal: Negative for abdominal pain, blood in stool, constipation, diarrhea, heartburn, melena, nausea and vomiting.  Genitourinary: Negative for dysuria, frequency, hematuria and urgency.  Musculoskeletal: Negative for back pain, falls, joint pain and myalgias.  Skin: Negative for itching and rash.  Neurological: Positive for weakness. Negative for dizziness, tingling, focal weakness, loss of consciousness and headaches.  Endo/Heme/Allergies: Negative for polydipsia. Does not bruise/bleed easily.  Psychiatric/Behavioral: Negative for depression, memory loss and substance abuse. The patient is not nervous/anxious.    Physical Examination   Vitals: Visit Vitals  . BP 128/84 (BP Location: Left upper arm, Patient Position: Sitting, BP Cuff Size: Adult)  .  Pulse 76  . Resp 12  . Ht 162.6 cm (5\' 4" )  . Wt 98.4 kg (217 lb)  . BMI 37.25 kg/m2   Ht:162.6 cm (5\' 4" ) Wt:98.4 kg (217 lb) KGY:JEHU surface area is 2.11 meters squared. Body mass index is 37.25 kg/(m^2).  Wt Readings from Last 3 Encounters:  03/30/15 98.4 kg (217 lb)  03/13/15 (!) 101.2 kg (223 lb 3.2 oz)  12/24/14 100.2 kg (221 lb)   BP Readings from Last 3 Encounters:  03/30/15 128/84  03/13/15 124/72  12/24/14 (!) 142/98   General: Caucasian female complaining of chest pain weakness and fatigue LUNGS Breath Sounds: expiratory rhonchi diffuse Percussion: hyperresonance  diffuse  CARDIOVASCULAR JVP CV wave: no HJR: no Elevation at 90 degrees: None Carotid Pulse: normal pulsation bilaterally Bruit: Soft bilateral carotid bruits Apex: apical impulse normal  Auscultation Rhythm: sinus bradycardia, rate=57 S1: normal S2: normal Clicks: no Rub: no Murmurs: no murmurs  Gallop: None ABDOMEN Liver enlargement: no Pulsatile aorta: no Ascites: no Bruits: no  EXTREMITIES Clubbing: no Edema: trace to 1+ bilateral pedal edema Pulses: peripheral pulses symmetrical Femoral Bruits: no Amputation: no SKIN Rash: no Cyanosis: no Embolic phemonenon: no Bruising: no NEURO Alert and Oriented to person, place and time: yes Non focal: yes LABS Last 3 CBC results: Lab Results  Component Value Date  WBC 4.2 05/16/2014  WBC 6.6 05/03/2011   Lab Results  Component Value Date  HGB 12.2 (L) 05/16/2014  HGB 12.9 05/03/2011   Lab Results  Component Value Date  HCT 37.1 05/16/2014  HCT 0.39 05/03/2011   Lab Results  Component Value Date  PLT 243 05/16/2014  PLT 281 05/03/2011   Lab Results  Component Value Date  CREATININE 0.8 06/30/2011  BUN 11 06/30/2011  NA 132 (L) 06/30/2011  K 4.2 06/30/2011  CL 96 (L) 06/30/2011  CO2 27 06/30/2011   Lab Results  Component Value Date  HGBA1C 5.6 05/03/2011   Lab Results  Component Value Date  HDL 63 05/03/2011   Lab Results  Component Value Date  LDLCALC 82 05/03/2011   Lab Results  Component Value Date  TRIG 63 05/03/2011   Lab Results  Component Value Date  ALT 15 05/03/2011  AST 19 05/03/2011  ALKPHOS 72 05/03/2011   Lab Results  Component Value Date  TSH 0.80 05/03/2011   EKG: normal sinus rhythm, nonspecific ST and T waves changes   Assessment and Plan   74 y.o. female with  ICD-10-CM ICD-9-CM  1. Acute chest pain-patient has chest pain. Has a history of PCI in the past with mild disease in her LAD with a stent and RCA. Patient had negative functional study and  echocardiogram in February. Patient continues to have exertional chest pain with radiation to her neck and jaw. Will need to proceed with left cardiac catheterization evaluate coronary anatomy to determine whether not there is progression of her Coronary disease. Risks and benefits a left cardiac catheterization were explained to the patient she agrees to proceed. R07.9 786.50    2. SOB (shortness of breath)-shortness of breath is unclear etiology. No significant structural abnormalities noted. Etiology is unclear. Concern over possible ischemia. Will evaluate with a cardiac catheterization to determine. R06.02 786.05  3. Cardiovascular disease-as per above. I25.10 429.2  4. Essential hypertension-blood pressure is controlled with amlodipine, enalapril and metoprolol. Will continue with this and follow including a dash diet I10 401.9  5. Anxiety F41.9 300.00   Return in about 6 months (around 09/28/2015).  These notes  generated with voice recognition software. I apologize for typographical errors.  Sydnee Levans, MD

## 2015-04-03 NOTE — Discharge Instructions (Signed)

## 2015-06-04 ENCOUNTER — Other Ambulatory Visit: Payer: Self-pay | Admitting: Nurse Practitioner

## 2015-06-04 DIAGNOSIS — Z1231 Encounter for screening mammogram for malignant neoplasm of breast: Secondary | ICD-10-CM

## 2015-09-18 ENCOUNTER — Other Ambulatory Visit: Payer: Medicare HMO

## 2015-09-22 ENCOUNTER — Encounter
Admission: RE | Admit: 2015-09-22 | Discharge: 2015-09-22 | Disposition: A | Discharge status: Home / Self Care | Payer: Medicare HMO | Source: Ambulatory Visit | Attending: Otolaryngology | Admitting: Otolaryngology

## 2015-09-22 DIAGNOSIS — J342 Deviated nasal septum: Secondary | ICD-10-CM | POA: Insufficient documentation

## 2015-09-22 DIAGNOSIS — Z01812 Encounter for preprocedural laboratory examination: Secondary | ICD-10-CM | POA: Diagnosis present

## 2015-09-22 HISTORY — DX: Anemia, unspecified: D64.9

## 2015-09-22 LAB — DIFFERENTIAL
BASOS PCT: 1 %
Basophils Absolute: 0.1 10*3/uL (ref 0–0.1)
EOS ABS: 0.1 10*3/uL (ref 0–0.7)
EOS PCT: 3 %
LYMPHS PCT: 24 %
Lymphs Abs: 1.3 10*3/uL (ref 1.0–3.6)
MONO ABS: 0.7 10*3/uL (ref 0.2–0.9)
Monocytes Relative: 12 %
NEUTROS PCT: 60 %
Neutro Abs: 3.4 10*3/uL (ref 1.4–6.5)

## 2015-09-22 LAB — CBC
HCT: 35.4 % (ref 35.0–47.0)
Hemoglobin: 11.6 g/dL — ABNORMAL LOW (ref 12.0–16.0)
MCH: 27.7 pg (ref 26.0–34.0)
MCHC: 32.7 g/dL (ref 32.0–36.0)
MCV: 84.5 fL (ref 80.0–100.0)
PLATELETS: 248 10*3/uL (ref 150–440)
RBC: 4.19 MIL/uL (ref 3.80–5.20)
RDW: 16 % — AB (ref 11.5–14.5)
WBC: 5.5 10*3/uL (ref 3.6–11.0)

## 2015-09-22 NOTE — Patient Instructions (Addendum)
  Your procedure is scheduled on: September 28, 2015 (Monday) Report to [facility]  FLOOR To find out your arrival time please call [phone] between 1PM - 3PM on September 25, 2015 (Friday).  Remember: Instructions that are not followed completely may result in serious medical risk, up to and including death, or upon the discretion of your surgeon and anesthesiologist your surgery may need to be rescheduled.    __x__ 1. Do not eat food or drink liquids after midnight. No gum chewing or hard candies.     __x_ 2. No Alcohol for 24 hours before or after surgery.   ____ 3. Bring all medications with you on the day of surgery if instructed.    __x__ 4. Notify your doctor if there is any change in your medical condition     (cold, fever, infections).     Do not wear jewelry, make-up, hairpins, clips or nail polish.  Do not wear lotions, powders, or perfumes. You may wear deodorant.  Do not shave 48 hours prior to surgery. Men may shave face and neck.  Do not bring valuables to the hospital.    [facility] is not responsible for any belongings or valuables.               Contacts, dentures or bridgework may not be worn into surgery.  Leave your suitcase in the car. After surgery it may be brought to your room.  For patients admitted to the hospital, discharge time is determined by your                treatment team.   Patients discharged the day of surgery will not be allowed to drive home.   Please read over the following fact sheets that you were given:   Surgical Site Infection Prevention   __X_ Take these medicines the morning of surgery with A SIP OF WATER:    1. Omeprazole (Omeprazole at bedtime on April 2)  2. Enalapril  3. Amlodipine  4.Metoprolol  5.Gabapentin  6.  ____ Fleet Enema (as directed)   ____ Use CHG Soap as directed  __x_ Use inhalers on the day of surgery (Use Albuterol inhaler the morning of surgery and bring to hospital)  ____ Stop  metformin 2 days prior to surgery    ____ Take 1/2 of usual insulin dose the night before surgery and none on the morning of surgery.   __x__ Stop Coumadin/Plavix/aspirin on (N/A)  __x__ Stop Anti-inflammatories on (NO NSAIDS)   __x__ Stop supplements until after surgery.  (STOP ALL SUPPLEMENTS NOW)  ____ Bring C-Pap to the hospital.

## 2015-09-22 NOTE — Pre-Procedure Instructions (Signed)
Cardiac clearance received from Dr. Ubaldo Glassing, patient at low risk per clearance.

## 2015-09-23 NOTE — Pre-Procedure Instructions (Signed)
Medical clearance received from Dr. Benny Lennert.

## 2015-09-28 ENCOUNTER — Ambulatory Visit: Payer: Medicare HMO | Admitting: Anesthesiology

## 2015-09-28 ENCOUNTER — Encounter
Admission: RE | Disposition: A | Discharge status: Home / Self Care | Payer: Self-pay | Source: Ambulatory Visit | Attending: Otolaryngology

## 2015-09-28 ENCOUNTER — Encounter: Payer: Self-pay | Admitting: *Deleted

## 2015-09-28 ENCOUNTER — Ambulatory Visit
Admission: RE | Admit: 2015-09-28 | Discharge: 2015-09-28 | Disposition: A | Discharge status: Home / Self Care | Payer: Medicare HMO | Source: Ambulatory Visit | Attending: Otolaryngology | Admitting: Otolaryngology

## 2015-09-28 DIAGNOSIS — Z85828 Personal history of other malignant neoplasm of skin: Secondary | ICD-10-CM | POA: Insufficient documentation

## 2015-09-28 DIAGNOSIS — Z955 Presence of coronary angioplasty implant and graft: Secondary | ICD-10-CM | POA: Insufficient documentation

## 2015-09-28 DIAGNOSIS — Z79899 Other long term (current) drug therapy: Secondary | ICD-10-CM | POA: Diagnosis not present

## 2015-09-28 DIAGNOSIS — I10 Essential (primary) hypertension: Secondary | ICD-10-CM | POA: Insufficient documentation

## 2015-09-28 DIAGNOSIS — G4730 Sleep apnea, unspecified: Secondary | ICD-10-CM | POA: Diagnosis not present

## 2015-09-28 DIAGNOSIS — M1990 Unspecified osteoarthritis, unspecified site: Secondary | ICD-10-CM | POA: Diagnosis not present

## 2015-09-28 DIAGNOSIS — J449 Chronic obstructive pulmonary disease, unspecified: Secondary | ICD-10-CM | POA: Diagnosis not present

## 2015-09-28 DIAGNOSIS — Z7951 Long term (current) use of inhaled steroids: Secondary | ICD-10-CM | POA: Diagnosis not present

## 2015-09-28 DIAGNOSIS — M797 Fibromyalgia: Secondary | ICD-10-CM | POA: Insufficient documentation

## 2015-09-28 DIAGNOSIS — I2510 Atherosclerotic heart disease of native coronary artery without angina pectoris: Secondary | ICD-10-CM | POA: Diagnosis not present

## 2015-09-28 DIAGNOSIS — J324 Chronic pansinusitis: Secondary | ICD-10-CM | POA: Diagnosis not present

## 2015-09-28 DIAGNOSIS — J342 Deviated nasal septum: Secondary | ICD-10-CM | POA: Insufficient documentation

## 2015-09-28 DIAGNOSIS — J4520 Mild intermittent asthma, uncomplicated: Secondary | ICD-10-CM | POA: Insufficient documentation

## 2015-09-28 DIAGNOSIS — I252 Old myocardial infarction: Secondary | ICD-10-CM | POA: Insufficient documentation

## 2015-09-28 DIAGNOSIS — J343 Hypertrophy of nasal turbinates: Secondary | ICD-10-CM | POA: Diagnosis not present

## 2015-09-28 HISTORY — PX: IMAGE GUIDED SINUS SURGERY: SHX6570

## 2015-09-28 SURGERY — SINUS SURGERY, WITH IMAGING GUIDANCE
Anesthesia: General | Laterality: Bilateral | Wound class: Clean Contaminated

## 2015-09-28 MED ORDER — ONDANSETRON HCL 4 MG/2ML IJ SOLN: 4.0000 mg | Freq: Once | INTRAMUSCULAR | Status: DC | PRN

## 2015-09-28 MED ORDER — ONDANSETRON HCL 4 MG/2ML IJ SOLN
INTRAMUSCULAR | Status: DC | PRN
  Administered 2015-09-28: 4 mg via INTRAVENOUS

## 2015-09-28 MED ORDER — LIDOCAINE-EPINEPHRINE (PF) 1 %-1:200000 IJ SOLN
INTRAMUSCULAR | Status: DC | PRN
Start: 2015-09-28 — End: 2015-09-28
  Administered 2015-09-28: 6.5 mL

## 2015-09-28 MED ORDER — PROPOFOL 10 MG/ML IV BOLUS
INTRAVENOUS | Status: DC | PRN
  Administered 2015-09-28: 150 mg via INTRAVENOUS

## 2015-09-28 MED ORDER — FENTANYL CITRATE (PF) 100 MCG/2ML IJ SOLN
25.0000 ug | INTRAMUSCULAR | Status: DC | PRN
  Administered 2015-09-28: 25 ug via INTRAVENOUS

## 2015-09-28 MED ORDER — LIDOCAINE HCL (CARDIAC) 20 MG/ML IV SOLN
INTRAVENOUS | Status: DC | PRN
  Administered 2015-09-28: 80 mg via INTRAVENOUS

## 2015-09-28 MED ORDER — LACTATED RINGERS IV SOLN
INTRAVENOUS | Status: DC
Start: 2015-09-28 — End: 2015-09-28
  Administered 2015-09-28 (×2): via INTRAVENOUS

## 2015-09-28 MED ORDER — PHENYLEPHRINE HCL 10 % OP SOLN
OPHTHALMIC | Status: DC | PRN
  Administered 2015-09-28: 20 mL via NASAL

## 2015-09-28 MED ORDER — MIDAZOLAM HCL 2 MG/2ML IJ SOLN
INTRAMUSCULAR | Status: DC | PRN
  Administered 2015-09-28 (×2): 1 mg via INTRAVENOUS

## 2015-09-28 MED ORDER — PHENYLEPHRINE HCL 10 % OP SOLN
Freq: Once | OPHTHALMIC | Status: DC
  Filled 2015-09-28: qty 10

## 2015-09-28 MED ORDER — BACITRACIN ZINC 500 UNIT/GM EX OINT
TOPICAL_OINTMENT | CUTANEOUS | Status: AC
  Filled 2015-09-28: qty 28.35

## 2015-09-28 MED ORDER — FENTANYL CITRATE (PF) 100 MCG/2ML IJ SOLN
INTRAMUSCULAR | Status: DC | PRN
  Administered 2015-09-28 (×4): 50 ug via INTRAVENOUS

## 2015-09-28 MED ORDER — PHENYLEPHRINE HCL 10 MG/ML IJ SOLN
INTRAMUSCULAR | Status: DC | PRN
  Administered 2015-09-28 (×3): 100 ug via INTRAVENOUS

## 2015-09-28 MED ORDER — ROCURONIUM BROMIDE 100 MG/10ML IV SOLN
INTRAVENOUS | Status: DC | PRN
  Administered 2015-09-28 (×2): 10 mg via INTRAVENOUS
  Administered 2015-09-28: 40 mg via INTRAVENOUS
  Administered 2015-09-28: 10 mg via INTRAVENOUS
  Administered 2015-09-28: 5 mg via INTRAVENOUS

## 2015-09-28 MED ORDER — CEFAZOLIN SODIUM 1-5 GM-% IV SOLN
1.0000 g | Freq: Once | INTRAVENOUS | Status: AC
  Administered 2015-09-28: 1 g via INTRAVENOUS

## 2015-09-28 MED ORDER — OXYMETAZOLINE HCL 0.05 % NA SOLN
NASAL | Status: AC
  Filled 2015-09-28: qty 15

## 2015-09-28 MED ORDER — DEXAMETHASONE SODIUM PHOSPHATE 10 MG/ML IJ SOLN
INTRAMUSCULAR | Status: AC
Start: 2015-09-28 — End: 2015-09-28
  Administered 2015-09-28: 10 mg via INTRAVENOUS
  Filled 2015-09-28: qty 1

## 2015-09-28 MED ORDER — CEFAZOLIN SODIUM 1-5 GM-% IV SOLN
INTRAVENOUS | Status: AC
  Filled 2015-09-28: qty 50

## 2015-09-28 MED ORDER — FENTANYL CITRATE (PF) 100 MCG/2ML IJ SOLN
INTRAMUSCULAR | Status: AC
  Filled 2015-09-28: qty 2

## 2015-09-28 MED ORDER — SUGAMMADEX SODIUM 200 MG/2ML IV SOLN
INTRAVENOUS | Status: DC | PRN
  Administered 2015-09-28: 196.8 mg via INTRAVENOUS

## 2015-09-28 MED ORDER — LIDOCAINE-EPINEPHRINE (PF) 1 %-1:200000 IJ SOLN
INTRAMUSCULAR | Status: AC
  Filled 2015-09-28: qty 30

## 2015-09-28 MED ORDER — ONDANSETRON HCL 4 MG/2ML IJ SOLN: INTRAMUSCULAR | Status: DC | PRN

## 2015-09-28 MED ORDER — OXYMETAZOLINE HCL 0.05 % NA SOLN
2.0000 | Freq: Once | NASAL | Status: AC
  Administered 2015-09-28: 2 via NASAL

## 2015-09-28 MED ORDER — GLYCOPYRROLATE 0.2 MG/ML IJ SOLN
INTRAMUSCULAR | Status: DC | PRN
  Administered 2015-09-28: 0.2 mg via INTRAVENOUS

## 2015-09-28 MED ORDER — DEXAMETHASONE SODIUM PHOSPHATE 10 MG/ML IJ SOLN
10.0000 mg | Freq: Once | INTRAMUSCULAR | Status: AC
  Administered 2015-09-28: 10 mg via INTRAVENOUS

## 2015-09-28 SURGICAL SUPPLY — 42 items
BATTERY INSTRU NAVIGATION (MISCELLANEOUS) ×8 IMPLANT
BLADE SURG 15 STRL LF DISP TIS (BLADE) ×1 IMPLANT
BLADE SURG 15 STRL SS (BLADE) ×1
CANISTER SUCT 1200ML W/VALVE (MISCELLANEOUS) ×2 IMPLANT
CANISTER SUCT 3000ML (MISCELLANEOUS) ×2 IMPLANT
COAG SUCT 10F 3.5MM HAND CTRL (MISCELLANEOUS) ×2 IMPLANT
CUP MEDICINE 2OZ PLAST GRAD ST (MISCELLANEOUS) ×2 IMPLANT
DEVICE INFLATION 20/61 (MISCELLANEOUS) ×2 IMPLANT
DRESSING NASL FOAM PST OP SINU (MISCELLANEOUS) IMPLANT
DRSG NASAL 4CM NASOPORE (MISCELLANEOUS) ×2 IMPLANT
DRSG NASAL FOAM POST OP SINU (MISCELLANEOUS)
ELECT REM PT RETURN 9FT ADLT (ELECTROSURGICAL) ×2
ELECTRODE REM PT RTRN 9FT ADLT (ELECTROSURGICAL) ×1 IMPLANT
GLOVE EXAM LX STRL 7.5 (GLOVE) ×4 IMPLANT
GOWN STRL REUS W/ TWL LRG LVL3 (GOWN DISPOSABLE) ×2 IMPLANT
GOWN STRL REUS W/TWL LRG LVL3 (GOWN DISPOSABLE) ×2
IRRIGATOR 4MM STR (IRRIGATION / IRRIGATOR) ×2 IMPLANT
IV NS 500ML (IV SOLUTION) ×1
IV NS 500ML BAXH (IV SOLUTION) ×1 IMPLANT
LABEL OR SOLS (LABEL) IMPLANT
NAVIGATION MASK REG  ST (MISCELLANEOUS) ×2 IMPLANT
NEEDLE SPNL 25GX3.5 QUINCKE BL (NEEDLE) ×2 IMPLANT
NS IRRIG 500ML POUR BTL (IV SOLUTION) ×2 IMPLANT
PACK HEAD/NECK (MISCELLANEOUS) ×2 IMPLANT
PACKING NASAL EPIS 4X2.4 XEROG (MISCELLANEOUS) ×4 IMPLANT
PATTIES SURGICAL .5 X3 (DISPOSABLE) ×2 IMPLANT
SET HANDPIECE IRR DIEGO (MISCELLANEOUS) ×2 IMPLANT
SOL ANTI-FOG 6CC FOG-OUT (MISCELLANEOUS) ×1 IMPLANT
SOL FOG-OUT ANTI-FOG 6CC (MISCELLANEOUS) ×1
SPLINT NASAL REUTER (MISCELLANEOUS) ×2 IMPLANT
SPOGE SURGIFLO 8M (HEMOSTASIS)
SPONGE SURGIFLO 8M (HEMOSTASIS) IMPLANT
SUT CHROMIC 3-0 (SUTURE) ×1
SUT CHROMIC 3-0 KS 27XMFL CR (SUTURE) ×1
SUT ETHILON 3-0 KS 30 BLK (SUTURE) ×2 IMPLANT
SUT PLAIN GUT 4-0 (SUTURE) ×2 IMPLANT
SUTURE CHRMC 3-0 KS 27XMFL CR (SUTURE) ×1 IMPLANT
SWAB CULTURE AMIES ANAERIB BLU (MISCELLANEOUS) IMPLANT
SYR 20CC LL (SYRINGE) ×2 IMPLANT
SYR 3ML LL SCALE MARK (SYRINGE) ×2 IMPLANT
SYSTEM BALLN SINUPLASTY 6X16 (BALLOONS) ×2 IMPLANT
WATER STERILE IRR 1000ML POUR (IV SOLUTION) IMPLANT

## 2015-09-28 NOTE — H&P (Signed)
  H&P has been reviewed and no changes necessary. To be downloaded later. 

## 2015-09-28 NOTE — Discharge Instructions (Signed)

## 2015-09-28 NOTE — Anesthesia Procedure Notes (Signed)
Procedure Name: Intubation Performed by: Demetrius Charity Pre-anesthesia Checklist: Patient identified, Patient being monitored, Timeout performed, Emergency Drugs available and Suction available Patient Re-evaluated:Patient Re-evaluated prior to inductionOxygen Delivery Method: Circle system utilized Preoxygenation: Pre-oxygenation with 100% oxygen Intubation Type: IV induction Ventilation: Mask ventilation without difficulty Laryngoscope Size: Mac and 3 Grade View: Grade II Tube type: Oral Rae Tube size: 7.0 mm Number of attempts: 1 Airway Equipment and Method: Stylet Placement Confirmation: ETT inserted through vocal cords under direct vision,  positive ETCO2 and breath sounds checked- equal and bilateral Secured at: 21 cm Tube secured with: Tape Dental Injury: Teeth and Oropharynx as per pre-operative assessment

## 2015-09-28 NOTE — Progress Notes (Signed)
Fake cocaine sent to OR with patient

## 2015-09-28 NOTE — Op Note (Signed)
09/28/2015  10:03 AM    Stacy Allen  [id number]   Pre-Op Dx:  Chronic pansinusitis, septal deviation, turbinate hypertrophy  Post-op Dx: Same  Proc: Bilateral endoscopic frontal sinusotomy, bilateral endoscopic total ethmoidectomy, bilateral endoscopic sphenoid sinusotomy, bilateral endoscopic maxillary antrostomy, septoplasty, outfracture inferior turbinates, use of image guided system   Surg:  Arjun Hard H  Anes:  GOT  EBL:  100 mL  Comp:  None  Findings:  Very deviated septum to the left side, thickened mucous membranes in most of the sinuses with clear mucus draining from the frontal sinuses  Procedure: The patient was brought to the operating room placed in a supine position. She was given general anesthesia by oral endotracheal intubation. The nose was prepped using 6-1/2 mL of 1%: 200,000 for infiltration of the septum and lateral nasal walls. Cottonoid pledgets soaked in phenylephrine and Xylocaine were then placed into the nasal passages on both sides. The image guided system was then brought in and the CT scan was downloaded from the disc. The template was then applied to the face and registered to the system. The suction isthmus were then registered and checked to the CT scan. There was 0.8 mm of variance but the system was off. It had to be recalibrated 2 more times until the system aligned properly. Patient was prepped and draped in a sterile fashion.  The 0 scope was used to visualize the nose and nasal passages on both sides. There was a very large bony septal spur posteriorly on the left side. A left Killian incision was created with elevation of mucoperichondrium on the left side of the quadrangular plate. The bony cartilaginous junction was split and the mucoperiosteum was elevated on both sides the vomer and ethmoid plate. The very crooked bone and vomer was removed. There was small tear on the left side posteriorly and inferiorly where the spur was sticking out. The  mucosal flaps were placed back into their anatomic position. A small portion of the quadrangular plate was trimmed inferiorly to allow swing back towards the midline. The mucosal flaps were then sutured to the septum in the midline using a 4 plain gut suture in a through and through whip stitch fashion. This was used to close the Windsor incision as well.  The 0 scope was then used to visualize the nasal passages. The left side was much more open. The left middle turbinate had been lateralized was sitting against the lateral wall. It was fractured medially to visualize the middle meatus better and using a side biting forceps the uncinate process was incised and removed. The Diego microdebrider was used to widen the opening to make sure the natural ostium was included in the opening. The 30 scope was used to visualize this area. The 0 scope was then used again for opening up the middle and posterior ethmoid air cells using the [place]. The 30 scope was then used to open up the anterior ethmoid air cells as well. The Acclarent balloon system was then used to cannulize the frontal sinus duct and dilate this. The frontal sinus opening could be seen in the party wall between the frontal sinus auger nasi cell was taken down with the frontal sinus through biting forceps. This left a wider opening into the frontal sinus. The 0 scope was then used to visualize medial to the middle turbinate near the frontal sinus duct. I could find a small opening here and this was widened using a small sphenoid punch. This was opened inferiorly  and medially. The 0 scope could then see the sinus and this was cleaned of debris and old blood. The image guided system was used to evaluate the depth of dissection for all of the sinuses make sure all the air cells were opened. A cottonoid pledget was then placed the left side once all the sinuses were opened to help vasoconstrict while the right side was addressed.  The  procedure was repeated on the right side using the 0 scope for infracturing middle turbinate area and this turbinate was significantly larger. The uncinate process was incised and the middle meatus was widened using the backbiters and Diego microdebrider. There is a very large Haller cell here that was opened and partially removed using 90 forceps. The most medial portion of this could not be reached through the maxillary antrum. The 0 scope was was then used for opening up the posterior middle ethmoid air cells with the Dixon. Image guided system was used to evaluate the depth of dissection. Once these air cells were completely opened the 30 scope was used for opening up the anterior ethmoid air cells. The Acclarent balloon system was then used to cannulize the frontal sinus duct and dilate the opening. The through biting frontal sinus instruments were then used to remove the party wall between the auger nasi cell and frontal sinus duct to widen this opening. With the 30 and 70 scope mucus easily up into the frontal sinus. The 0 scope was then used to find the sphenoid sinus opening medial to the middle turbinate. This was widened again inferiorly and medially using the sphenoid punch to make sure the sinus was open and clear. Suction of all debris. The image guided system was used to verify the depth of dissection of this side.  The left side was visualized again with the 0 and 30 scopes in all sinuses were clear. The inferior turbinates were outfractured. Xerogel was then placed into the anterior ethmoid air cell and then the posterior ethmoid air cells to help hold the turbinate medialized and lubricate the sinuses. This was then repeated on the right side. All the air cells were opened and the inferior turbinate was outfractured. Xerogel was placed in the anterior and posterior ethmoid air cells to hold the middle turbinate medialized. 0.5 mm regular sized splints were then trimmed and  placed on both sides of the septum and held in place with a 3-0 nylon suture. The patient tolerated procedure well. She was awakened taken to the recovery room in satisfactory condition. There were no operative complications.  Dispo:   To PACU to be discharged home  Plan:  To follow-up in the office in 1 week for splint removal and cleaning of sinuses. To call the office if there are any challenges. She will continue to use hydrocodone for pain since she has been on that and tolerates it well. She'll be on a small prednisone taper and antibiotics postoperatively. She is to start nasal flushes tomorrow  Traci Gafford H  09/28/2015 10:03 AM

## 2015-09-28 NOTE — Transfer of Care (Signed)
Immediate Anesthesia Transfer of Care Note  Patient: Stacy Allen  Procedure(s) Performed: Procedure(s): IMAGE GUIDED SINUS SURGERY, SEPTOPLASTY, BILATERAL FRONTAL SINUSOTOMIES, BILATERAL MAXILLARY ANTROSTOMIES, BILATERAL TOTAL ETHMOIDECTOMY, BILATERAL SPHENOIDECTOMY, BILATERAL INFERIOR TURBINATE REDUCTION (Bilateral)  Patient Location: PACU  Anesthesia Type:General  Level of Consciousness: awake, alert  and oriented  Airway & Oxygen Therapy: Patient Spontanous Breathing and Patient connected to face mask oxygen  Post-op Assessment: Report given to RN and Post -op Vital signs reviewed and stable  Post vital signs: Reviewed and stable  Last Vitals:  Filed Vitals:   09/28/15 0624  BP: 140/82  Pulse: 67  Temp: 36.9 C  Resp: 12    Complications: No apparent anesthesia complications

## 2015-09-28 NOTE — Anesthesia Postprocedure Evaluation (Signed)
Anesthesia Post Note  Patient: Stacy Allen  Procedure(s) Performed: Procedure(s) (LRB): IMAGE GUIDED SINUS SURGERY, SEPTOPLASTY, BILATERAL FRONTAL SINUSOTOMIES, BILATERAL MAXILLARY ANTROSTOMIES, BILATERAL TOTAL ETHMOIDECTOMY, BILATERAL SPHENOIDECTOMY, BILATERAL INFERIOR TURBINATE REDUCTION (Bilateral)  Patient location during evaluation: PACU Anesthesia Type: General Level of consciousness: awake and alert Pain management: pain level controlled Vital Signs Assessment: post-procedure vital signs reviewed and stable Respiratory status: spontaneous breathing and respiratory function stable Cardiovascular status: blood pressure returned to baseline and stable Anesthetic complications: no    Last Vitals:  Filed Vitals:   09/28/15 1128 09/28/15 1158  BP:  156/89  Pulse: 73 75  Temp: 35.8 C   Resp: 16     Last Pain:  Filed Vitals:   09/28/15 1200  PainSc: 1                  Lakaya Tolen K

## 2015-09-28 NOTE — Anesthesia Preprocedure Evaluation (Signed)
Anesthesia Evaluation  Patient identified by MRN, date of birth, ID band Patient awake    Reviewed: Allergy & Precautions, NPO status , [name] Chart, lab work & pertinent test results  History of Anesthesia Complications Negative for: history of anesthetic complications  Airway Mallampati: III       Dental   Pulmonary shortness of breath, sleep apnea , COPD,  COPD inhaler,           Cardiovascular hypertension, Pt. on medications and Pt. on home beta blockers + angina + CAD, + Past MI and + Cardiac Stents       Neuro/Psych Anxiety Depression negative neurological ROS     GI/Hepatic Neg liver ROS, hiatal hernia, PUD, GERD  Medicated and Controlled,  Endo/Other  Hypothyroidism   Renal/GU negative Renal ROS     Musculoskeletal  (+) Arthritis , Osteoarthritis,  Fibromyalgia -  Abdominal   Peds  Hematology  (+) anemia ,   Anesthesia Other Findings   Reproductive/Obstetrics                             Anesthesia Physical Anesthesia Plan  ASA: III  Anesthesia Plan:    Post-op Pain Management:    Induction: Intravenous  Airway Management Planned: Oral ETT  Additional Equipment:   Intra-op Plan:   Post-operative Plan:   Informed Consent: I have reviewed the patients History and Physical, chart, labs and discussed the procedure including the risks, benefits and alternatives for the proposed anesthesia with the patient or authorized representative who has indicated his/her understanding and acceptance.     Plan Discussed with:   Anesthesia Plan Comments:         Anesthesia Quick Evaluation

## 2015-09-29 LAB — SURGICAL PATHOLOGY

## 2015-10-21 ENCOUNTER — Ambulatory Visit
Admission: RE | Admit: 2015-10-21 | Discharge: 2015-10-21 | Disposition: A | Discharge status: Home / Self Care | Payer: Medicare HMO | Source: Ambulatory Visit | Attending: Nurse Practitioner | Admitting: Nurse Practitioner

## 2015-10-21 DIAGNOSIS — Z1231 Encounter for screening mammogram for malignant neoplasm of breast: Secondary | ICD-10-CM | POA: Insufficient documentation

## 2016-01-22 ENCOUNTER — Ambulatory Visit
Admission: EM | Admit: 2016-01-22 | Discharge: 2016-01-22 | Disposition: A | Discharge status: Home / Self Care | Payer: Medicare HMO | Attending: Family Medicine | Admitting: Family Medicine

## 2016-01-22 DIAGNOSIS — L299 Pruritus, unspecified: Secondary | ICD-10-CM | POA: Diagnosis not present

## 2016-01-22 NOTE — ED Provider Notes (Signed)
[facility]    CSN: [id number] Arrival date & time: 01/22/16  [id number]  First Provider Contact:  None       History   Chief Complaint Chief Complaint  Patient presents with  . Pruritis    HPI Stacy Allen is a 75 y.o. female.   The history is provided by the patient.  Patient complains of itching and saw her PCP and they stated they don't have any injection strong enough to help. She has been itching for about a week, denies any new soaps. She is taking prednisone from her PCP and it isn't helping. She says her mother use to itch all the time also, and that there are small blisters that will pop up. Itching is all over her body. Currently does not have a rash. Has been taking prednisone and benadryl. States her PCP (NP at [facility]) instructed her to come here to get a "shot for allergy". Patient denies any chest pains, shortness of breath, fevers, chills, wheezing.      Past Medical History:  Diagnosis Date  . Adenomatous polyps   . Allergic state   . Anemia   . Anginal pain ([place])   . Anxiety   . Arthritis    osteoarthritis  . Cancer ([place])    skin  . Chicken pox   . Coronary artery disease   . Depression   . Eczema   . Fibromyalgia   . GERD (gastroesophageal reflux disease)   . Headache    migraines  . Hemorrhoids   . History of hiatal hernia   . Hypercalcemia   . Hyperlipidemia   . Hypertension   . Hypothyroidism   . Lumbar stenosis   . Obesity   . Peptic ulcer disease   . Shortness of breath dyspnea   . Sleep apnea   . Vertigo     There are no active problems to display for this patient.   Past Surgical History:  Procedure Laterality Date  . ABDOMINAL HYSTERECTOMY    . BACK SURGERY  2007   Dr. Mauri Pole, [facility], Spinal Fusion  . CARDIAC CATHETERIZATION N/A 04/03/2015   Procedure: Left Heart Cath and Coronary Angiography;  Surgeon: Teodoro Spray, MD;  Location: [place] [facility];  Service: Cardiovascular;  Laterality: N/A;  .  CHOLECYSTECTOMY    . CORONARY ANGIOPLASTY  2015   Dr. Ubaldo Glassing, [facility] Lab  . FRACTURE SURGERY Left 1991   Fractures Femur, [place] SINUS SURGERY Bilateral 09/28/2015   Procedure: IMAGE GUIDED SINUS SURGERY, SEPTOPLASTY, BILATERAL FRONTAL SINUSOTOMIES, BILATERAL MAXILLARY ANTROSTOMIES, BILATERAL TOTAL ETHMOIDECTOMY, BILATERAL SPHENOIDECTOMY, BILATERAL INFERIOR TURBINATE REDUCTION;  Surgeon: Margaretha Sheffield, MD;  Location: [facility] ORS;  Service: ENT;  Laterality: Bilateral;    OB History    No data available       Home Medications    Prior to Admission medications   Medication Sig Start Date End Date Taking? Authorizing Provider  acetaminophen (TYLENOL) 500 MG tablet Take 500 mg by mouth every 6 (six) hours as needed.   Yes Historical Provider, MD  albuterol (PROVENTIL HFA;VENTOLIN HFA) 108 (90 BASE) MCG/ACT inhaler Inhale 2 puffs into the lungs.   Yes Historical Provider, MD  amLODipine (NORVASC) 5 MG tablet Take 5 mg by mouth 2 (two) times daily.   Yes Historical Provider, MD  celecoxib (CELEBREX) 200 MG capsule Take 200 mg by mouth 3 (three) times a week. prn   Yes Historical Provider, MD  citalopram (CELEXA)  40 MG tablet Take 40 mg by mouth daily.   Yes Historical Provider, MD  clonazePAM (KLONOPIN) 1 MG tablet Take 1 mg by mouth 2 (two) times daily. As needed for anxiety   Yes Historical Provider, MD  enalapril (VASOTEC) 20 MG tablet Take 20 mg by mouth daily.   Yes Historical Provider, MD  ergocalciferol (VITAMIN D2) 50000 UNITS capsule Take 50,000 Units by mouth every 30 (thirty) days.   Yes Historical Provider, MD  fluticasone (VERAMYST) 27.5 MCG/SPRAY nasal spray Place 2 sprays into the nose daily.   Yes Historical Provider, MD  gabapentin (NEURONTIN) 300 MG capsule Take 600 mg by mouth 3 (three) times daily. Two tabs to total 600 mgm by mouth 3 times daily   Yes Historical Provider, MD  HYDROcodone-acetaminophen (NORCO/VICODIN) 5-325 MG tablet Take 1 tablet by  mouth every 4 (four) hours as needed for moderate pain.   Yes Historical Provider, MD  levothyroxine (SYNTHROID, LEVOTHROID) 112 MCG tablet Take 112 mcg by mouth daily before breakfast.   Yes Historical Provider, MD  meclizine (ANTIVERT) 25 MG tablet Take 25 mg by mouth 3 (three) times daily as needed for dizziness.   Yes Historical Provider, MD  metoprolol tartrate (LOPRESSOR) 25 MG tablet Take 12.5 mg by mouth 2 (two) times daily.    Yes Historical Provider, MD  nitroGLYCERIN (NITROSTAT) 0.4 MG SL tablet Place 0.4 mg under the tongue every 5 (five) minutes as needed for chest pain.   Yes Historical Provider, MD  omeprazole (PRILOSEC) 20 MG capsule Take 40 mg by mouth 2 (two) times daily before a meal.    Yes Historical Provider, MD  phenazopyridine (PYRIDIUM) 95 MG tablet Take 95 mg by mouth 3 (three) times daily as needed for pain.   Yes Historical Provider, MD  pravastatin (PRAVACHOL) 40 MG tablet Take 40 mg by mouth at bedtime.   Yes Historical Provider, MD  predniSONE (DELTASONE) 10 MG tablet Take 10 mg by mouth daily with breakfast.   Yes Historical Provider, MD  risperiDONE (RISPERDAL) 0.25 MG tablet Take 0.25 mg by mouth at bedtime.   Yes Historical Provider, MD    Family History History reviewed. No pertinent family history.  Social History Social History  Substance Use Topics  . Smoking status: Never Smoker  . Smokeless tobacco: Never Used  . Alcohol use No     Allergies   Zithromax [azithromycin]; Codeine; Lactose intolerance (gi); Lodine [etodolac]; Morphine and related; Red dye; Shellfish-derived products; and Latex   Review of Systems Review of Systems   Physical Exam Triage Vital Signs ED Triage Vitals [01/22/16 0910]  Enc Vitals Group     BP (!) 145/90     Pulse Rate 66     Resp 18     Temp 97.7 F (36.5 C)     Temp Source Oral     SpO2 97 %     Weight 220 lb (99.8 kg)     Height 5\' 5"  (1.651 m)     Head Circumference      Peak Flow      Pain Score 0      Pain Loc      Pain Edu?      Excl. in [place]?    No data found.   Updated Vital Signs BP (!) 145/90 (BP Location: Right Arm)   Pulse 66   Temp 97.7 F (36.5 C) (Oral)   Resp 18   Ht 5\' 5"  (1.651 m)   Wt 220 lb (99.8  kg)   SpO2 97%   BMI 36.61 kg/m   Visual Acuity Right Eye Distance:   Left Eye Distance:   Bilateral Distance:    Right Eye Near:   Left Eye Near:    Bilateral Near:     Physical Exam  Constitutional: She appears well-developed and well-nourished. No distress.  Cardiovascular: Normal rate and regular rhythm.   Pulmonary/Chest: Effort normal and breath sounds normal. No respiratory distress.  Skin: No rash noted. She is not diaphoretic. No erythema.  Nursing note and vitals reviewed.    UC Treatments / Results  Labs (all labs ordered are listed, but only abnormal results are displayed) Labs Reviewed - No data to display  EKG  EKG Interpretation None       Radiology No results found.  Procedures Procedures (including critical care time)  Medications Ordered in UC Medications - No data to display   Initial Impression / Assessment and Plan / UC Course  I have reviewed the triage vital signs and the nursing notes.  Pertinent labs & imaging results that were available during my care of the patient were reviewed by me and considered in my medical decision making (see chart for details).  Clinical Course      Final Clinical Impressions(s) / UC Diagnoses   Final diagnoses:  Pruritus  (unknown etiology)   New Prescriptions Discharge Medication List as of 01/22/2016 10:33 AM     Discharge Medication List as of 01/22/2016 10:33 AM     1. diagnosis reviewed with patient (possible etiologies reviewed) 2. Recommend continue current medications and adding H2 blocker; recommend patient request referral to dermatologist and/or allergist from PCP 3. Patient upset because she thought she was going to be getting an "allergy shot" 4. F/u prn  Norval Gable, MD 02/09/16 1141

## 2016-01-22 NOTE — ED Triage Notes (Signed)
Patient complains of itching and saw her PCP and they stated they don't have any injection strong enough to help. She has been itching for about a week, denies any new soaps. She is taking prednisone from her PCP and it isn't helping. She says her mother use to itch all the time also, and that there are small blisters that will pop up. Itching is all over her body.

## 2016-07-02 ENCOUNTER — Ambulatory Visit
Admission: EM | Admit: 2016-07-02 | Discharge: 2016-07-02 | Disposition: A | Discharge status: Home / Self Care | Payer: Medicare HMO | Attending: Emergency Medicine | Admitting: Emergency Medicine

## 2016-07-02 DIAGNOSIS — R300 Dysuria: Secondary | ICD-10-CM | POA: Diagnosis not present

## 2016-07-02 LAB — URINALYSIS, COMPLETE (UACMP) WITH MICROSCOPIC

## 2016-07-02 MED ORDER — CEPHALEXIN 500 MG PO CAPS: 500.0000 mg | ORAL_CAPSULE | Freq: Two times a day (BID) | ORAL | 0 refills | Status: DC

## 2016-07-02 MED ORDER — CEPHALEXIN 500 MG PO TABS: 500.0000 mg | ORAL_TABLET | Freq: Two times a day (BID) | ORAL | 0 refills | Status: AC

## 2016-07-02 NOTE — ED Triage Notes (Signed)
Patient complains of UTI symptoms that started this morning. Patient states that she has had frequency and urgency and painful urination. Patient states that she took AZO before she came today to help with the pain.

## 2016-07-02 NOTE — ED Provider Notes (Signed)
[facility] ____________________________________________  Time seen: Approximately 12:09 PM  I have reviewed the triage vital signs and the nursing notes.   HISTORY  Chief Complaint Dysuria   HPI Stacy Allen is a 76 y.o. female presenting for the complaints of urinary frequency and burning with urination for the last 2-3 days. Patient reports increased burning with urination this morning and she took an over-the-counter Azo tablet which helped. Patient reports that she does have a history of interstitial cystitis and does sometimes have burning with urination, patient reports urinary frequency is never present for her interstitial cystitis but present as the first symptom for her urinary tract infections. Patient reports last UTI was approximately 1 year ago. Denies recent antibiotic use. Reports continues to eat and drink well. Denies fevers. Denies changes in chronic low back pain. Denies side pain or abdominal pain. Denies vaginal complaints. Patient reports feels well otherwise. Denies any history of renal insufficiency. Denies history of antibiotic resistant UTIs.  Frazier Richards, MD: PCP   Past Medical History:  Diagnosis Date  . Adenomatous polyps   . Allergic state   . Anemia   . Anginal pain ([place])   . Anxiety   . Arthritis    osteoarthritis  . Cancer ([place])    skin  . Chicken pox   . Coronary artery disease   . Depression   . Eczema   . Fibromyalgia   . GERD (gastroesophageal reflux disease)   . Headache    migraines  . Hemorrhoids   . History of hiatal hernia   . Hypercalcemia   . Hyperlipidemia   . Hypertension   . Hypothyroidism   . Lumbar stenosis   . Obesity   . Peptic ulcer disease   . Shortness of breath dyspnea   . Sleep apnea   . Vertigo     There are no active problems to display for this patient.   Past Surgical History:  Procedure Laterality Date  . ABDOMINAL HYSTERECTOMY    . BACK SURGERY  2007   Dr. Mauri Pole, [facility], Spinal  Fusion  . CARDIAC CATHETERIZATION N/A 04/03/2015   Procedure: Left Heart Cath and Coronary Angiography;  Surgeon: Teodoro Spray, MD;  Location: [place] [facility];  Service: Cardiovascular;  Laterality: N/A;  . CHOLECYSTECTOMY    . CORONARY ANGIOPLASTY  2015   Dr. Ubaldo Glassing, [facility] Cath Lab  . FRACTURE SURGERY Left 1991   Fractures Femur, [place] SINUS SURGERY Bilateral 09/28/2015   Procedure: IMAGE GUIDED SINUS SURGERY, SEPTOPLASTY, BILATERAL FRONTAL SINUSOTOMIES, BILATERAL MAXILLARY ANTROSTOMIES, BILATERAL TOTAL ETHMOIDECTOMY, BILATERAL SPHENOIDECTOMY, BILATERAL INFERIOR TURBINATE REDUCTION;  Surgeon: Margaretha Sheffield, MD;  Location: [facility] ORS;  Service: ENT;  Laterality: Bilateral;    Current Outpatient Rx  . Order #: [id number] Class: Historical Med  . Order #: [id number] Class: Historical Med  . Order #: [id number] Class: Historical Med  . Order #: [id number] Class: Historical Med  . Order #: [id number] Class: Historical Med  . Order #: [id number] Class: Historical Med  . Order #: [id number] Class: Historical Med  . Order #: [id number] Class: Historical Med  . Order #: [id number] Class: Historical Med  . Order #: [id number] Class: Historical Med  . Order #: [id number] Class: Historical Med  . Order #: [id number] Class: Historical Med  . Order #: [id number] Class: Historical Med  . Order #: [id number] Class: Historical Med  . Order #: [id number] Class: Historical Med  . Order #: [id number] Class: Historical Med  . Order #: [id number] Class: Historical Med  . Order #: [id number] Class: Normal  .  Order #: [id number] Class: Historical Med  . Order #: [id number] Class: Historical Med  . Order #: [id number] Class: Historical Med    No current facility-administered medications for this encounter.   Current Outpatient Prescriptions:  .  acetaminophen (TYLENOL) 500 MG tablet, Take 500 mg by mouth every 6 (six) hours as needed., Disp: , Rfl:  .  albuterol (PROVENTIL HFA;VENTOLIN HFA) 108 (90 BASE) MCG/ACT  inhaler, Inhale 2 puffs into the lungs., Disp: , Rfl:  .  amLODipine (NORVASC) 5 MG tablet, Take 5 mg by mouth 2 (two) times daily., Disp: , Rfl:  .  celecoxib (CELEBREX) 200 MG capsule, Take 200 mg by mouth 3 (three) times a week. prn, Disp: , Rfl:  .  citalopram (CELEXA) 40 MG tablet, Take 40 mg by mouth daily., Disp: , Rfl:  .  clonazePAM (KLONOPIN) 1 MG tablet, Take 1 mg by mouth 2 (two) times daily. As needed for anxiety, Disp: , Rfl:  .  enalapril (VASOTEC) 20 MG tablet, Take 20 mg by mouth daily., Disp: , Rfl:  .  ergocalciferol (VITAMIN D2) 50000 UNITS capsule, Take 50,000 Units by mouth every 30 (thirty) days., Disp: , Rfl:  .  fluticasone (VERAMYST) 27.5 MCG/SPRAY nasal spray, Place 2 sprays into the nose daily., Disp: , Rfl:  .  gabapentin (NEURONTIN) 300 MG capsule, Take 600 mg by mouth 3 (three) times daily. Two tabs to total 600 mgm by mouth 3 times daily, Disp: , Rfl:  .  HYDROcodone-acetaminophen (NORCO/VICODIN) 5-325 MG tablet, Take 1 tablet by mouth every 4 (four) hours as needed for moderate pain., Disp: , Rfl:  .  levothyroxine (SYNTHROID, LEVOTHROID) 112 MCG tablet, Take 112 mcg by mouth daily before breakfast., Disp: , Rfl:  .  meclizine (ANTIVERT) 25 MG tablet, Take 25 mg by mouth 3 (three) times daily as needed for dizziness., Disp: , Rfl:  .  metoprolol tartrate (LOPRESSOR) 25 MG tablet, Take 12.5 mg by mouth 2 (two) times daily. , Disp: , Rfl:  .  nitroGLYCERIN (NITROSTAT) 0.4 MG SL tablet, Place 0.4 mg under the tongue every 5 (five) minutes as needed for chest pain., Disp: , Rfl:  .  omeprazole (PRILOSEC) 20 MG capsule, Take 40 mg by mouth 2 (two) times daily before a meal. , Disp: , Rfl:  .  phenazopyridine (PYRIDIUM) 95 MG tablet, Take 95 mg by mouth 3 (three) times daily as needed for pain., Disp: , Rfl:  .  Cephalexin 500 MG tablet, Take 1 tablet (500 mg total) by mouth 2 (two) times daily., Disp: 14 tablet, Rfl: 0 .  pravastatin (PRAVACHOL) 40 MG tablet, Take 40 mg  by mouth at bedtime., Disp: , Rfl:  .  predniSONE (DELTASONE) 10 MG tablet, Take 10 mg by mouth daily with breakfast., Disp: , Rfl:  .  risperiDONE (RISPERDAL) 0.25 MG tablet, Take 0.25 mg by mouth at bedtime., Disp: , Rfl:   Allergies Zithromax [azithromycin]; Codeine; Lactose intolerance (gi); Lodine [etodolac]; Morphine and related; Red dye; Shellfish-derived products; and Latex  History reviewed. No pertinent family history.  Social History Social History  Substance Use Topics  . Smoking status: Never Smoker  . Smokeless tobacco: Never Used  . Alcohol use No    Review of Systems Constitutional: No fever/chills Eyes: No visual changes. ENT: No sore throat. Cardiovascular: Denies Current chest pain. Reports currently following with cardiology due to intermittent chest pain. Denies any current chest pain or recent. Respiratory: Denies shortness of breath. Gastrointestinal:   No nausea, no vomiting.  No  diarrhea.  No constipation. Genitourinary: Positive for dysuria. Musculoskeletal: Denies changes of chronic back pain. Skin: Negative for rash. Neurological: Negative for focal weakness or numbness.  10-point ROS otherwise negative.  ____________________________________________   PHYSICAL EXAM:  VITAL SIGNS: ED Triage Vitals  Enc Vitals Group     BP 07/02/16 1105 (!) 156/90     Pulse Rate 07/02/16 1105 65     Resp 07/02/16 1105 17     Temp 07/02/16 1105 97.7 F (36.5 C)     Temp Source 07/02/16 1105 Oral     SpO2 07/02/16 1105 98 %     Weight 07/02/16 1102 221 lb (100.2 kg)     Height 07/02/16 1102 5\' 4"  (1.626 m)     Head Circumference --      Peak Flow --      Pain Score 07/02/16 1105 9     Pain Loc --      Pain Edu? --      Excl. in [place]? --     Constitutional: Alert and oriented. Well appearing and in no acute distress. Eyes: Conjunctivae are normal. PERRL. EOMI. ENT      Head: Normocephalic and atraumatic.      Nose: No congestion/rhinnorhea.       Mouth/Throat: Mucous membranes are moist.Oropharynx non-erythematous. Neck: No stridor. Supple without meningismus.  Hematological/Lymphatic/Immunilogical: No cervical lymphadenopathy. Cardiovascular: Normal rate, regular rhythm. Grossly normal heart sounds.  Good peripheral circulation. Respiratory: Normal respiratory effort without tachypnea nor retractions. Breath sounds are clear and equal bilaterally. No wheezes/rales/rhonchi.. Gastrointestinal: Soft and nontender. No distention. Normal Bowel sounds. No CVA tenderness. Musculoskeletal:  Nontender with normal range of motion in all extremities. No midline cervical, thoracic or lumbar tenderness to palpation. Bilateral pedal pulses equal and easily palpated.      Right lower leg:  No tenderness or edema.      Left lower leg:  No tenderness or edema.  Neurologic:  Normal speech and language. No gross focal neurologic deficits are appreciated. Speech is normal. No gait instability.  Skin:  Skin is warm, dry and intact. No rash noted. Psychiatric: Mood and affect are normal. Speech and behavior are normal. Patient exhibits appropriate insight and judgment   ___________________________________________   LABS (all labs ordered are listed, but only abnormal results are displayed)  Labs Reviewed  URINALYSIS, COMPLETE (UACMP) WITH MICROSCOPIC - Abnormal; Notable for the following:       Result Value   Color, Urine ORANGE (*)    APPearance CLOUDY (*)    Glucose, UA   (*)    Value: TEST NOT REPORTED DUE TO COLOR INTERFERENCE OF URINE PIGMENT   Hgb urine dipstick   (*)    Value: TEST NOT REPORTED DUE TO COLOR INTERFERENCE OF URINE PIGMENT   Bilirubin Urine   (*)    Value: TEST NOT REPORTED DUE TO COLOR INTERFERENCE OF URINE PIGMENT   Ketones, ur   (*)    Value: TEST NOT REPORTED DUE TO COLOR INTERFERENCE OF URINE PIGMENT   Protein, ur   (*)    Value: TEST NOT REPORTED DUE TO COLOR INTERFERENCE OF URINE PIGMENT   Nitrite   (*)    Value: TEST  NOT REPORTED DUE TO COLOR INTERFERENCE OF URINE PIGMENT   Leukocytes, UA   (*)    Value: TEST NOT REPORTED DUE TO COLOR INTERFERENCE OF URINE PIGMENT   Squamous Epithelial / LPF 0-5 (*)    Bacteria, UA FEW (*)    All other  components within normal limits  URINE CULTURE     PROCEDURES Procedures    INITIAL IMPRESSION / ASSESSMENT AND PLAN / ED COURSE  Pertinent labs & imaging results that were available during my care of the patient were reviewed by me and considered in my medical decision making (see chart for details).  Well-appearing patient. No acute distress. Urinary urgency and burning with urination for the last 2 days. Patient does report history of interstitial cystitis, but reports symptoms feel more consistent with urinary tract infection. Patient had taken Azo prior to arrival interfering with urinalysis examination. Urine is noted to be cloudy with few bacteria. Concern for urinary tract infection. Will culture urine and await culture results. Will empirically start patient on oral cephalexin. Encourage rest, fluids and close PCP follow-up.  Discussed follow up with Primary care physician this week. Discussed follow up and return parameters including no resolution or any worsening concerns. Patient verbalized understanding and agreed to plan.   ____________________________________________   FINAL CLINICAL IMPRESSION(S) / ED DIAGNOSES  Final diagnoses:  Dysuria     New Prescriptions   CEPHALEXIN 500 MG TABLET    Take 1 tablet (500 mg total) by mouth 2 (two) times daily.    Note: This dictation was prepared with Dragon dictation along with smaller phrase technology. Any transcriptional errors that result from this process are unintentional.    Clinical Course      Marylene Land, NP 07/02/16 1216

## 2016-07-02 NOTE — Discharge Instructions (Signed)
Take medication as prescribed. Rest. Drink plenty of fluids.  ° °Follow up with your primary care physician this week. Return to Urgent care for new or worsening concerns.  ° °

## 2016-07-05 LAB — URINE CULTURE: Culture: 80000 — AB

## 2016-07-08 ENCOUNTER — Telehealth: Payer: Self-pay | Admitting: *Deleted

## 2016-07-08 NOTE — Telephone Encounter (Signed)
Patient returning earlier phone call. Reported positive urine culture. Patient stated that she was feeling better, but still having burning on urination. Advised patient to follow up with PCP or [place] for further treatment of symptoms.

## 2016-09-21 ENCOUNTER — Other Ambulatory Visit: Payer: Self-pay | Admitting: Family Medicine

## 2016-09-21 DIAGNOSIS — Z1231 Encounter for screening mammogram for malignant neoplasm of breast: Secondary | ICD-10-CM

## 2016-10-24 ENCOUNTER — Ambulatory Visit
Admission: RE | Admit: 2016-10-24 | Discharge: 2016-10-24 | Disposition: A | Discharge status: Home / Self Care | Payer: Medicare HMO | Source: Ambulatory Visit | Attending: Family Medicine | Admitting: Family Medicine

## 2016-10-24 DIAGNOSIS — Z1231 Encounter for screening mammogram for malignant neoplasm of breast: Secondary | ICD-10-CM | POA: Diagnosis not present

## 2016-11-01 ENCOUNTER — Other Ambulatory Visit: Payer: Self-pay | Admitting: Orthopedic Surgery

## 2016-11-01 DIAGNOSIS — M545 Low back pain, unspecified: Secondary | ICD-10-CM

## 2016-11-01 DIAGNOSIS — M48062 Spinal stenosis, lumbar region with neurogenic claudication: Secondary | ICD-10-CM

## 2016-11-01 DIAGNOSIS — M5440 Lumbago with sciatica, unspecified side: Secondary | ICD-10-CM

## 2016-11-01 DIAGNOSIS — M5416 Radiculopathy, lumbar region: Secondary | ICD-10-CM

## 2016-11-23 ENCOUNTER — Ambulatory Visit
Admission: RE | Admit: 2016-11-23 | Discharge: 2016-11-23 | Disposition: A | Discharge status: Home / Self Care | Payer: Medicare HMO | Source: Ambulatory Visit | Attending: Orthopedic Surgery | Admitting: Orthopedic Surgery

## 2016-11-23 DIAGNOSIS — M4316 Spondylolisthesis, lumbar region: Secondary | ICD-10-CM | POA: Diagnosis not present

## 2016-11-23 DIAGNOSIS — M5440 Lumbago with sciatica, unspecified side: Secondary | ICD-10-CM | POA: Insufficient documentation

## 2016-11-23 DIAGNOSIS — M48062 Spinal stenosis, lumbar region with neurogenic claudication: Secondary | ICD-10-CM | POA: Insufficient documentation

## 2016-11-23 DIAGNOSIS — M5416 Radiculopathy, lumbar region: Secondary | ICD-10-CM | POA: Insufficient documentation

## 2016-11-23 DIAGNOSIS — M48061 Spinal stenosis, lumbar region without neurogenic claudication: Secondary | ICD-10-CM | POA: Insufficient documentation

## 2016-11-23 DIAGNOSIS — M4804 Spinal stenosis, thoracic region: Secondary | ICD-10-CM | POA: Diagnosis not present

## 2016-11-23 DIAGNOSIS — M5136 Other intervertebral disc degeneration, lumbar region: Secondary | ICD-10-CM | POA: Insufficient documentation

## 2016-11-23 DIAGNOSIS — M545 Low back pain, unspecified: Secondary | ICD-10-CM

## 2016-12-21 ENCOUNTER — Ambulatory Visit
Admission: RE | Admit: 2016-12-21 | Discharge: 2016-12-21 | Disposition: A | Discharge status: Home / Self Care | Payer: Medicare HMO | Source: Ambulatory Visit | Attending: Unknown Physician Specialty | Admitting: Unknown Physician Specialty

## 2016-12-21 ENCOUNTER — Ambulatory Visit: Payer: Medicare HMO | Admitting: Anesthesiology

## 2016-12-21 ENCOUNTER — Encounter: Payer: Self-pay | Admitting: Anesthesiology

## 2016-12-21 ENCOUNTER — Encounter
Admission: RE | Disposition: A | Discharge status: Home / Self Care | Payer: Self-pay | Source: Ambulatory Visit | Attending: Unknown Physician Specialty

## 2016-12-21 DIAGNOSIS — I10 Essential (primary) hypertension: Secondary | ICD-10-CM | POA: Insufficient documentation

## 2016-12-21 DIAGNOSIS — Z91040 Latex allergy status: Secondary | ICD-10-CM | POA: Insufficient documentation

## 2016-12-21 DIAGNOSIS — Z885 Allergy status to narcotic agent status: Secondary | ICD-10-CM | POA: Diagnosis not present

## 2016-12-21 DIAGNOSIS — F329 Major depressive disorder, single episode, unspecified: Secondary | ICD-10-CM | POA: Diagnosis not present

## 2016-12-21 DIAGNOSIS — E785 Hyperlipidemia, unspecified: Secondary | ICD-10-CM | POA: Insufficient documentation

## 2016-12-21 DIAGNOSIS — M1990 Unspecified osteoarthritis, unspecified site: Secondary | ICD-10-CM | POA: Insufficient documentation

## 2016-12-21 DIAGNOSIS — E039 Hypothyroidism, unspecified: Secondary | ICD-10-CM | POA: Diagnosis not present

## 2016-12-21 DIAGNOSIS — K2950 Unspecified chronic gastritis without bleeding: Secondary | ICD-10-CM | POA: Insufficient documentation

## 2016-12-21 DIAGNOSIS — Z85828 Personal history of other malignant neoplasm of skin: Secondary | ICD-10-CM | POA: Insufficient documentation

## 2016-12-21 DIAGNOSIS — K219 Gastro-esophageal reflux disease without esophagitis: Secondary | ICD-10-CM | POA: Insufficient documentation

## 2016-12-21 DIAGNOSIS — I2510 Atherosclerotic heart disease of native coronary artery without angina pectoris: Secondary | ICD-10-CM | POA: Insufficient documentation

## 2016-12-21 DIAGNOSIS — K449 Diaphragmatic hernia without obstruction or gangrene: Secondary | ICD-10-CM | POA: Insufficient documentation

## 2016-12-21 DIAGNOSIS — M797 Fibromyalgia: Secondary | ICD-10-CM | POA: Diagnosis not present

## 2016-12-21 DIAGNOSIS — R1013 Epigastric pain: Secondary | ICD-10-CM | POA: Diagnosis not present

## 2016-12-21 DIAGNOSIS — Z91013 Allergy to seafood: Secondary | ICD-10-CM | POA: Insufficient documentation

## 2016-12-21 DIAGNOSIS — F419 Anxiety disorder, unspecified: Secondary | ICD-10-CM | POA: Insufficient documentation

## 2016-12-21 DIAGNOSIS — Z9102 Food additives allergy status: Secondary | ICD-10-CM | POA: Diagnosis not present

## 2016-12-21 HISTORY — DX: Vitamin D deficiency, unspecified: E55.9

## 2016-12-21 HISTORY — PX: ESOPHAGOGASTRODUODENOSCOPY (EGD) WITH PROPOFOL: SHX5813

## 2016-12-21 SURGERY — ESOPHAGOGASTRODUODENOSCOPY (EGD) WITH PROPOFOL
Anesthesia: General

## 2016-12-21 MED ORDER — SODIUM CHLORIDE 0.9 % IV SOLN: INTRAVENOUS | Status: DC

## 2016-12-21 MED ORDER — MIDAZOLAM HCL 2 MG/2ML IJ SOLN
INTRAMUSCULAR | Status: DC | PRN
  Administered 2016-12-21: 2 mg via INTRAVENOUS

## 2016-12-21 MED ORDER — LIDOCAINE HCL (CARDIAC) 20 MG/ML IV SOLN: INTRAVENOUS | Status: DC | PRN

## 2016-12-21 MED ORDER — FENTANYL CITRATE (PF) 100 MCG/2ML IJ SOLN
INTRAMUSCULAR | Status: AC
  Filled 2016-12-21: qty 2

## 2016-12-21 MED ORDER — SODIUM CHLORIDE 0.9 % IV SOLN
INTRAVENOUS | Status: DC
  Administered 2016-12-21: 1000 mL via INTRAVENOUS

## 2016-12-21 MED ORDER — MIDAZOLAM HCL 2 MG/2ML IJ SOLN
INTRAMUSCULAR | Status: AC
  Filled 2016-12-21: qty 2

## 2016-12-21 MED ORDER — PROPOFOL 500 MG/50ML IV EMUL
INTRAVENOUS | Status: AC
  Filled 2016-12-21: qty 50

## 2016-12-21 MED ORDER — GLYCOPYRROLATE 0.2 MG/ML IJ SOLN
INTRAMUSCULAR | Status: DC | PRN
  Administered 2016-12-21: 0.1 mg via INTRAVENOUS

## 2016-12-21 MED ORDER — LIDOCAINE HCL (CARDIAC) 20 MG/ML IV SOLN
INTRAVENOUS | Status: DC | PRN
  Administered 2016-12-21: 30 mg via INTRAVENOUS

## 2016-12-21 MED ORDER — MIDAZOLAM HCL 2 MG/2ML IJ SOLN: INTRAMUSCULAR | Status: DC | PRN

## 2016-12-21 MED ORDER — PROPOFOL 500 MG/50ML IV EMUL
INTRAVENOUS | Status: DC | PRN
  Administered 2016-12-21: 100 ug/kg/min via INTRAVENOUS

## 2016-12-21 MED ORDER — FENTANYL CITRATE (PF) 100 MCG/2ML IJ SOLN
INTRAMUSCULAR | Status: DC | PRN
  Administered 2016-12-21: 50 ug via INTRAVENOUS

## 2016-12-21 NOTE — Anesthesia Post-op Follow-up Note (Cosign Needed)
Anesthesia QCDR form completed.        

## 2016-12-21 NOTE — Transfer of Care (Signed)
Immediate Anesthesia Transfer of Care Note  Patient: Stacy Allen  Procedure(s) Performed: Procedure(s): ESOPHAGOGASTRODUODENOSCOPY (EGD) WITH PROPOFOL (N/A)  Patient Location: PACU  Anesthesia Type:General  Level of Consciousness: awake and sedated  Airway & Oxygen Therapy: Patient Spontanous Breathing and Patient connected to nasal cannula oxygen  Post-op Assessment: Report given to RN and Post -op Vital signs reviewed and stable  Post vital signs: Reviewed and stable  Last Vitals:  Vitals:   12/21/16 1252  BP: (!) 181/93  Pulse: 63  Resp: 18  Temp: 36.3 C    Last Pain:  Vitals:   12/21/16 1252  TempSrc: Tympanic  PainSc: 5       Patients Stated Pain Goal: 0 (17/71/16 5790)  Complications: No apparent anesthesia complications

## 2016-12-21 NOTE — Anesthesia Preprocedure Evaluation (Signed)
Anesthesia Evaluation  Patient identified by MRN, date of birth, ID band Patient awake    Reviewed: Allergy & Precautions, NPO status , [name] Chart, lab work & pertinent test results  History of Anesthesia Complications Negative for: history of anesthetic complications  Airway Mallampati: II  TM Distance: >3 FB Neck ROM: Full    Dental no notable dental hx.    Pulmonary sleep apnea and Continuous Positive Airway Pressure Ventilation , neg COPD,    breath sounds clear to auscultation- rhonchi (-) wheezing      Cardiovascular hypertension, Pt. on medications (-) angina+ CAD and + Cardiac Stents   Rhythm:Regular Rate:Normal - Systolic murmurs and - Diastolic murmurs    Neuro/Psych  Headaches, PSYCHIATRIC DISORDERS Anxiety Depression    GI/Hepatic Neg liver ROS, hiatal hernia, PUD, GERD  Medicated,  Endo/Other  neg diabetesHypothyroidism   Renal/GU negative Renal ROS     Musculoskeletal  (+) Arthritis , Fibromyalgia -  Abdominal (+) + obese,   Peds  Hematology  (+) anemia ,   Anesthesia Other Findings Past Medical History: No date: Adenomatous polyps No date: Allergic state No date: Anemia No date: Anginal pain ([place]) No date: Anxiety No date: Arthritis     Comment: osteoarthritis No date: Cancer ([place])     Comment: skin No date: Chicken pox No date: Coronary artery disease No date: Depression No date: Eczema No date: Fibromyalgia No date: GERD (gastroesophageal reflux disease) No date: Headache     Comment: migraines No date: Hemorrhoids No date: History of hiatal hernia No date: Hypercalcemia No date: Hyperlipidemia No date: Hypertension No date: Hypothyroidism No date: Lumbar stenosis No date: Obesity No date: Peptic ulcer disease No date: Shortness of breath dyspnea No date: Sleep apnea No date: Vertigo No date: Vitamin D deficiency   Reproductive/Obstetrics                             Anesthesia Physical Anesthesia Plan  ASA: III  Anesthesia Plan: General   Post-op Pain Management:    Induction: Intravenous  PONV Risk Score and Plan: 2 and Propofol  Airway Management Planned: Natural Airway  Additional Equipment:   Intra-op Plan:   Post-operative Plan:   Informed Consent: I have reviewed the patients History and Physical, chart, labs and discussed the procedure including the risks, benefits and alternatives for the proposed anesthesia with the patient or authorized representative who has indicated his/her understanding and acceptance.   Dental advisory given  Plan Discussed with: CRNA and Anesthesiologist  Anesthesia Plan Comments:         Anesthesia Quick Evaluation

## 2016-12-21 NOTE — Op Note (Signed)
[facility] Gastroenterology Patient Name: Stacy Allen Procedure Date: 12/21/2016 12:55 PM MRN: [id number] Account #: [id number] Date of Birth: 07-21-1940 Admit Type: Outpatient Age: 76 Room: [facility] ENDO ROOM 3 Gender: Female Note Status: Finalized Procedure:            Upper GI endoscopy Indications:          Epigastric abdominal pain Providers:            Manya Silvas, MD Referring MD:         Hattie Perch. [place]o (Referring MD) Medicines:            Propofol per Anesthesia Complications:        No immediate complications. Procedure:            Pre-Anesthesia Assessment:                       - After reviewing the risks and benefits, the patient                        was deemed in satisfactory condition to undergo the                        procedure.                       After obtaining informed consent, the endoscope was                        passed under direct vision. Throughout the procedure,                        the patient's blood pressure, pulse, and oxygen                        saturations were monitored continuously. The Endoscope                        was introduced through the mouth, and advanced to the                        second part of duodenum. The upper GI endoscopy was                        accomplished without difficulty. The patient tolerated                        the procedure well. Findings:      The examined esophagus was normal. GEJ 34cm.      Patchy moderate inflammation characterized by erythema and granularity       was found in the gastric body. Biopsies were taken with a cold forceps       for histology. Biopsies were taken with a cold forceps for Helicobacter       pylori testing.      The examined duodenum was normal.      A large hiatal hernia was present. Most of the erythema was in the       proximal hiatal hernia. Impression:           - Normal esophagus.                       -  Gastritis. Biopsied.          - Normal examined duodenum.                       - Large hiatal hernia. Recommendation:       - Await pathology results. Manya Silvas, MD 12/21/2016 1:30:43 PM This report has been signed electronically. Number of Addenda: 0 Note Initiated On: 12/21/2016 12:55 PM      [facility]

## 2016-12-21 NOTE — H&P (Signed)
Primary Care Physician:  Frazier Richards, MD Primary Gastroenterologist:  Dr. Vira Agar  Pre-Procedure History & Physical: HPI:  Stacy Allen is a 76 y.o. female is here for an endoscopy.   Past Medical History:  Diagnosis Date  . Adenomatous polyps   . Allergic state   . Anemia   . Anginal pain ([place])   . Anxiety   . Arthritis    osteoarthritis  . Cancer ([place])    skin  . Chicken pox   . Coronary artery disease   . Depression   . Eczema   . Fibromyalgia   . GERD (gastroesophageal reflux disease)   . Headache    migraines  . Hemorrhoids   . History of hiatal hernia   . Hypercalcemia   . Hyperlipidemia   . Hypertension   . Hypothyroidism   . Lumbar stenosis   . Obesity   . Peptic ulcer disease   . Shortness of breath dyspnea   . Sleep apnea   . Vertigo   . Vitamin D deficiency     Past Surgical History:  Procedure Laterality Date  . ABDOMINAL HYSTERECTOMY    . BACK SURGERY  2007   Dr. Mauri Pole, [facility], Spinal Fusion  . CARDIAC CATHETERIZATION N/A 04/03/2015   Procedure: Left Heart Cath and Coronary Angiography;  Surgeon: Teodoro Spray, MD;  Location: [place] [facility];  Service: Cardiovascular;  Laterality: N/A;  . CHOLECYSTECTOMY    . CORONARY ANGIOPLASTY  2015   Dr. Ubaldo Glassing, [facility] Cath Lab  . FRACTURE SURGERY Left 1991   Fractures Femur, [place] SINUS SURGERY Bilateral 09/28/2015   Procedure: IMAGE GUIDED SINUS SURGERY, SEPTOPLASTY, BILATERAL FRONTAL SINUSOTOMIES, BILATERAL MAXILLARY ANTROSTOMIES, BILATERAL TOTAL ETHMOIDECTOMY, BILATERAL SPHENOIDECTOMY, BILATERAL INFERIOR TURBINATE REDUCTION;  Surgeon: Margaretha Sheffield, MD;  Location: [facility] ORS;  Service: ENT;  Laterality: Bilateral;    Prior to Admission medications   Medication Sig Start Date End Date Taking? Authorizing Provider  acetaminophen (TYLENOL) 500 MG tablet Take 500 mg by mouth every 6 (six) hours as needed.   Yes [provider]  albuterol (PROVENTIL HFA;VENTOLIN HFA) 108  (90 BASE) MCG/ACT inhaler Inhale 2 puffs into the lungs.   Yes [provider]  amLODipine (NORVASC) 5 MG tablet Take 5 mg by mouth 2 (two) times daily.   Yes [provider]  clonazePAM (KLONOPIN) 1 MG tablet Take 1 mg by mouth 2 (two) times daily. As needed for anxiety   Yes [provider]  enalapril (VASOTEC) 20 MG tablet Take 20 mg by mouth daily.   Yes [provider]  ergocalciferol (VITAMIN D2) 50000 UNITS capsule Take 50,000 Units by mouth every 30 (thirty) days.   Yes [provider]  fluticasone (VERAMYST) 27.5 MCG/SPRAY nasal spray Place 2 sprays into the nose daily.   Yes [provider]  gabapentin (NEURONTIN) 300 MG capsule Take 600 mg by mouth 3 (three) times daily. Two tabs to total 600 mgm by mouth 3 times daily   Yes [provider]  HYDROcodone-acetaminophen (NORCO/VICODIN) 5-325 MG tablet Take 1 tablet by mouth every 4 (four) hours as needed for moderate pain.   Yes [provider]  levothyroxine (SYNTHROID, LEVOTHROID) 112 MCG tablet Take 112 mcg by mouth daily before breakfast.   Yes [provider]  meclizine (ANTIVERT) 25 MG tablet Take 25 mg by mouth 3 (three) times daily as needed for dizziness.   Yes [provider]  metoprolol tartrate (LOPRESSOR) 25 MG  tablet Take 12.5 mg by mouth 2 (two) times daily.    Yes [provider]  omeprazole (PRILOSEC) 20 MG capsule Take 40 mg by mouth 2 (two) times daily before a meal.    Yes [provider]  phenazopyridine (PYRIDIUM) 95 MG tablet Take 95 mg by mouth 3 (three) times daily as needed for pain.   Yes [provider]  predniSONE (DELTASONE) 10 MG tablet Take 10 mg by mouth daily with breakfast.   Yes [provider]  celecoxib (CELEBREX) 200 MG capsule Take 200 mg by mouth 3 (three) times a week. prn    [provider]  citalopram (CELEXA) 40 MG tablet Take 40 mg by mouth daily.    [provider]  nitroGLYCERIN (NITROSTAT) 0.4 MG SL tablet Place 0.4 mg under the tongue every 5 (five) minutes as needed for chest pain.    [provider]  pravastatin (PRAVACHOL) 40 MG tablet Take 40 mg by mouth at bedtime.    [provider]  risperiDONE (RISPERDAL) 0.25 MG tablet Take 0.25 mg by mouth at bedtime.    [provider]    Allergies as of 10/10/2016 - Review Complete 07/02/2016  Allergen Reaction Noted  . Zithromax [azithromycin] Diarrhea 04/02/2015  . Codeine Nausea And Vomiting 04/02/2015  . Lactose intolerance (gi) Other (See Comments) 04/02/2015  . Lodine [etodolac]  04/02/2015  . Morphine and related Hives 04/02/2015  . Red dye Itching 09/22/2015  . Shellfish-derived products Itching and Swelling 04/02/2015  . Latex Rash 09/23/2015    Family History  Problem Relation Age of Onset  . Breast cancer Neg Hx     Social History   Social History  . Marital status: Widowed    Spouse name: N/A  . Number of children: N/A  . Years of education: N/A   Occupational History  . Not on file.   Social History Main Topics  . Smoking status: Never Smoker  . Smokeless tobacco: Never Used  . Alcohol use No  . Drug use: No  . Sexual activity: Not on file   Other Topics Concern  . Not on file   Social History Narrative  . No narrative on file    Review of Systems: See HPI, otherwise negative ROS  Physical Exam: BP (!) 181/93   Pulse 63   Temp 97.3 F (36.3 C) (Tympanic)   Resp 18   Ht 5\' 4"  (1.626 m)   Wt 98.4 kg (217 lb)   SpO2 98%   BMI 37.25 kg/m  General:   Alert,  pleasant and cooperative in NAD Head:  Normocephalic and atraumatic. Neck:  Supple; no masses or thyromegaly. Lungs:  Clear throughout to auscultation.    Heart:  Regular rate and rhythm. Abdomen:  Soft, nontender and nondistended. Normal bowel sounds, without guarding, and without rebound.   Neurologic:  Alert and  oriented x4;  grossly normal  neurologically.  Impression/Plan: Stacy Allen is here for an endoscopy to be performed for epigastric abdominal pain.  Risks, benefits, limitations, and alternatives regarding  endoscopy have been reviewed with the patient.  Questions have been answered.  All parties agreeable.   Gaylyn Cheers, MD  12/21/2016, 1:11 PM

## 2016-12-21 NOTE — Anesthesia Procedure Notes (Signed)
Performed by: COOK-MARTIN, Kylian Loh Pre-anesthesia Checklist: Patient identified, Emergency Drugs available, Suction available, Patient being monitored and Timeout performed Patient Re-evaluated:Patient Re-evaluated prior to inductionOxygen Delivery Method: Nasal cannula Preoxygenation: Pre-oxygenation with 100% oxygen Intubation Type: IV induction Airway Equipment and Method: Bite block Placement Confirmation: CO2 detector and positive ETCO2     

## 2016-12-21 NOTE — Anesthesia Postprocedure Evaluation (Signed)
Anesthesia Post Note  Patient: Stacy Allen  Procedure(s) Performed: Procedure(s) (LRB): ESOPHAGOGASTRODUODENOSCOPY (EGD) WITH PROPOFOL (N/A)  Patient location during evaluation: Endoscopy Anesthesia Type: General Level of consciousness: awake and alert and oriented Pain management: pain level controlled Vital Signs Assessment: post-procedure vital signs reviewed and stable Respiratory status: spontaneous breathing, nonlabored ventilation and respiratory function stable Cardiovascular status: blood pressure returned to baseline and stable Postop Assessment: no signs of nausea or vomiting Anesthetic complications: no     Last Vitals:  Vitals:   12/21/16 1354 12/21/16 1404  BP: (!) 141/90 (!) 157/99  Pulse: 62 (!) 57  Resp: 12 17  Temp:      Last Pain:  Vitals:   12/21/16 1334  TempSrc: Tympanic  PainSc:                  Ikeisha Blumberg

## 2016-12-22 ENCOUNTER — Encounter: Payer: Self-pay | Admitting: Unknown Physician Specialty

## 2016-12-22 LAB — SURGICAL PATHOLOGY

## 2016-12-24 ENCOUNTER — Encounter: Payer: Self-pay | Admitting: Emergency Medicine

## 2016-12-24 ENCOUNTER — Ambulatory Visit
Admission: EM | Admit: 2016-12-24 | Discharge: 2016-12-24 | Disposition: A | Discharge status: Home / Self Care | Payer: Medicare HMO | Attending: Family Medicine | Admitting: Family Medicine

## 2016-12-24 DIAGNOSIS — R300 Dysuria: Secondary | ICD-10-CM | POA: Diagnosis not present

## 2016-12-24 DIAGNOSIS — N3001 Acute cystitis with hematuria: Secondary | ICD-10-CM | POA: Diagnosis not present

## 2016-12-24 LAB — URINALYSIS, COMPLETE (UACMP) WITH MICROSCOPIC
Bilirubin Urine: NEGATIVE
Glucose, UA: NEGATIVE mg/dL
Ketones, ur: NEGATIVE mg/dL
Nitrite: POSITIVE — AB
pH: 6 (ref 5.0–8.0)

## 2016-12-24 MED ORDER — CIPROFLOXACIN HCL 500 MG PO TABS: 500.0000 mg | ORAL_TABLET | Freq: Two times a day (BID) | ORAL | 0 refills | Status: DC

## 2016-12-24 NOTE — ED Triage Notes (Signed)
Patient c/o burning when urinating and increase in urinary urgency since Thursday night.

## 2016-12-24 NOTE — ED Provider Notes (Signed)
CSN: [id number]     Arrival date & time 12/24/16  1017 History   First MD Initiated Contact with Patient 12/24/16 1130     Chief Complaint  Patient presents with  . Dysuria   (Consider location/radiation/quality/duration/timing/severity/associated sxs/prior Treatment) HPI  76 yo female with dysuria. Patient complains of painful urination for 2 days. Patient states she is going more frequently. She denies any back pain or fevers. Has a history of urinary tract infection back in January 2018 that responded well to cephalexin. Patient denies any nausea or vomiting. No difficulty with urination.   Past Medical History:  Diagnosis Date  . Adenomatous polyps   . Allergic state   . Anemia   . Anginal pain ([place])   . Anxiety   . Arthritis    osteoarthritis  . Cancer ([place])    skin  . Chicken pox   . Coronary artery disease   . Depression   . Eczema   . Fibromyalgia   . GERD (gastroesophageal reflux disease)   . Headache    migraines  . Hemorrhoids   . History of hiatal hernia   . Hypercalcemia   . Hyperlipidemia   . Hypertension   . Hypothyroidism   . Lumbar stenosis   . Obesity   . Peptic ulcer disease   . Shortness of breath dyspnea   . Sleep apnea   . Vertigo   . Vitamin D deficiency    Past Surgical History:  Procedure Laterality Date  . ABDOMINAL HYSTERECTOMY    . BACK SURGERY  2007   Dr. Mauri Pole, [facility] Spinal Fusion  . CARDIAC CATHETERIZATION N/A 04/03/2015   Procedure: Left Heart Cath and Coronary Angiography;  Surgeon: Teodoro Spray, MD;  Location: [place] [facility];  Service: Cardiovascular;  Laterality: N/A;  . CHOLECYSTECTOMY    . CORONARY ANGIOPLASTY  2015   Dr. Ubaldo Glassing, [facility] Cath Lab  . ESOPHAGOGASTRODUODENOSCOPY (EGD) WITH PROPOFOL N/A 12/21/2016   Procedure: ESOPHAGOGASTRODUODENOSCOPY (EGD) WITH PROPOFOL;  Surgeon: Manya Silvas, MD;  Location: [facility] ENDOSCOPY;  Service: Endoscopy;  Laterality: N/A;  . FRACTURE SURGERY Left 1991   Fractures Femur,  [place] GUIDED SINUS SURGERY Bilateral 09/28/2015   Procedure: IMAGE GUIDED SINUS SURGERY, SEPTOPLASTY, BILATERAL FRONTAL SINUSOTOMIES, BILATERAL MAXILLARY ANTROSTOMIES, BILATERAL TOTAL ETHMOIDECTOMY, BILATERAL SPHENOIDECTOMY, BILATERAL INFERIOR TURBINATE REDUCTION;  Surgeon: Margaretha Sheffield, MD;  Location: [facility] ORS;  Service: ENT;  Laterality: Bilateral;   Family History  Problem Relation Age of Onset  . Breast cancer Neg Hx    Social History  Substance Use Topics  . Smoking status: Never Smoker  . Smokeless tobacco: Never Used  . Alcohol use No   OB History    No data available     Review of Systems  Constitutional: Negative for activity change, chills, fatigue and fever.  HENT: Negative for congestion, sinus pressure and sore throat.   Eyes: Negative for visual disturbance.  Respiratory: Negative for cough, chest tightness and shortness of breath.   Cardiovascular: Negative for chest pain and leg swelling.  Gastrointestinal: Negative for abdominal pain, diarrhea, nausea and vomiting.  Genitourinary: Positive for dysuria. Negative for difficulty urinating, flank pain and hematuria.  Musculoskeletal: Positive for back pain. Negative for arthralgias and gait problem.  Skin: Negative for rash.  Neurological: Negative for weakness, numbness and headaches.  Hematological: Negative for adenopathy.  Psychiatric/Behavioral: Negative for agitation, behavioral problems and confusion.    Allergies  Zithromax [azithromycin]; Adhesive [tape]; Codeine; Lactose intolerance (gi); Lodine [etodolac]; Morphine  and related; Red dye; Shellfish-derived products; and Latex  Home Medications   Prior to Admission medications   Medication Sig Start Date End Date Taking? Authorizing Provider  acetaminophen (TYLENOL) 500 MG tablet Take 500 mg by mouth every 6 (six) hours as needed.    [provider]  albuterol (PROVENTIL HFA;VENTOLIN HFA) 108 (90 BASE) MCG/ACT inhaler Inhale 2  puffs into the lungs.    [provider]  amLODipine (NORVASC) 5 MG tablet Take 5 mg by mouth 2 (two) times daily.    [provider]  celecoxib (CELEBREX) 200 MG capsule Take 200 mg by mouth 3 (three) times a week. prn    [provider]  ciprofloxacin (CIPRO) 500 MG tablet Take 1 tablet (500 mg total) by mouth every 12 (twelve) hours. 12/24/16   Duanne Guess, PA-C  citalopram (CELEXA) 40 MG tablet Take 40 mg by mouth daily.    [provider]  clonazePAM (KLONOPIN) 1 MG tablet Take 1 mg by mouth 2 (two) times daily. As needed for anxiety    [provider]  enalapril (VASOTEC) 20 MG tablet Take 20 mg by mouth daily.    [provider]  ergocalciferol (VITAMIN D2) 50000 UNITS capsule Take 50,000 Units by mouth every 30 (thirty) days.    [provider]  fluticasone (VERAMYST) 27.5 MCG/SPRAY nasal spray Place 2 sprays into the nose daily.    [provider]  gabapentin (NEURONTIN) 300 MG capsule Take 600 mg by mouth 3 (three) times daily. Two tabs to total 600 mgm by mouth 3 times daily    [provider]  HYDROcodone-acetaminophen (NORCO/VICODIN) 5-325 MG tablet Take 1 tablet by mouth every 4 (four) hours as needed for moderate pain.    [provider]  levothyroxine (SYNTHROID, LEVOTHROID) 112 MCG tablet Take 112 mcg by mouth daily before breakfast.    [provider]  meclizine (ANTIVERT) 25 MG tablet Take 25 mg by mouth 3 (three) times daily as needed for dizziness.    [provider]  metoprolol tartrate (LOPRESSOR) 25 MG tablet Take 12.5 mg by mouth 2 (two) times daily.     [provider]  nitroGLYCERIN (NITROSTAT) 0.4 MG SL tablet Place 0.4 mg under the tongue every 5 (five) minutes as needed for chest pain.    [provider]  omeprazole (PRILOSEC) 20 MG capsule Take 40 mg by mouth 2 (two) times daily before a meal.     [provider]  phenazopyridine  (PYRIDIUM) 95 MG tablet Take 95 mg by mouth 3 (three) times daily as needed for pain.    [provider]  pravastatin (PRAVACHOL) 40 MG tablet Take 40 mg by mouth at bedtime.    [provider]  predniSONE (DELTASONE) 10 MG tablet Take 10 mg by mouth daily with breakfast.    [provider]  risperiDONE (RISPERDAL) 0.25 MG tablet Take 0.25 mg by mouth at bedtime.    [provider]   Meds Ordered and Administered this Visit  Medications - No data to display  BP (!) 144/73 (BP Location: Left Arm)   Pulse 69   Temp 97.8 F (36.6 C) (Oral)   Resp 16   Ht 5\' 3"  (1.6 m)   Wt 217 lb (98.4 kg)   SpO2 100%   BMI 38.44 kg/m  No data found.   Physical Exam  Constitutional: She appears well-developed and well-nourished.  HENT:  Head: Normocephalic and atraumatic.  Eyes: Pupils are equal, round,  and reactive to light.  Neck: Normal range of motion. Neck supple.  Cardiovascular: Normal rate.   Pulmonary/Chest: Effort normal. No respiratory distress.  Abdominal: There is no tenderness.  Musculoskeletal:  No CVA tenderness.  Skin: No erythema.  Psychiatric: She has a normal mood and affect. Her behavior is normal.    Urgent Care Course     Procedures (including critical care time)  Labs Review Labs Reviewed  URINALYSIS, COMPLETE (UACMP) WITH MICROSCOPIC - Abnormal; Notable for the following:       Result Value   APPearance CLOUDY (*)    Specific Gravity, Urine >1.030 (*)    Hgb urine dipstick LARGE (*)    Protein, ur >300 (*)    Nitrite POSITIVE (*)    Leukocytes, UA SMALL (*)    Squamous Epithelial / LPF 0-5 (*)    Non Squamous Epithelial PRESENT (*)    Bacteria, UA MANY (*)    All other components within normal limits  URINE CULTURE    Imaging Review No results found.   Visual Acuity Review  Right Eye Distance:   Left Eye Distance:   Bilateral Distance:    Right Eye Near:   Left Eye Near:    Bilateral Near:         MDM    1. Acute cystitis with hematuria    76 year old female urinary tract infection. She is placed on Cipro. Having some mild lower back pain but no fevers nausea or vomiting. Tolerating by mouth well. We'll place on 7 day course of Cipro.    Duanne Guess, PA-C 12/24/16 1144

## 2016-12-27 LAB — URINE CULTURE: Culture: >=100000 — AB

## 2017-02-07 ENCOUNTER — Ambulatory Visit
Admission: EM | Admit: 2017-02-07 | Discharge: 2017-02-07 | Disposition: A | Discharge status: Home / Self Care | Payer: Medicare HMO | Attending: Family Medicine | Admitting: Family Medicine

## 2017-02-07 DIAGNOSIS — M5432 Sciatica, left side: Secondary | ICD-10-CM | POA: Diagnosis not present

## 2017-02-07 MED ORDER — METHYLPREDNISOLONE 4 MG PO TBPK: ORAL_TABLET | ORAL | 0 refills | Status: DC

## 2017-02-07 NOTE — ED Provider Notes (Signed)
[facility]    CSN: [id number] Arrival date & time: 02/07/17  0858     History   Chief Complaint Chief Complaint  Patient presents with  . Hip Pain    HPI Stacy Allen is a 76 y.o. female.   HPI  76 year old female who presents with sciatica on the left that radiates from the sciatic notch into her groin and also posterior thigh to her ankle. Most of the pain and burning that she feels today is on the lateral calf near the knee. She has been using Voltaren gel and ice. Currently her pain is 7 out of 10. She's had this pain for months and has been waxing and waning. Recently it has seemed to reach a apex not allowing her to sleep well and unremitting. He said no fever or chills. She has a previous history of lumbar stenosis has had lumbar surgery fusion as well as a history of fibromyalgia. She states that she is unable to take the nonsteroidal anti-inflammatory drugs but is taking Voltaren gel. In the past prednisone appears to be the only medication that gives her any relief. She states she has an appointment with her primary tomorrow.        Past Medical History:  Diagnosis Date  . Adenomatous polyps   . Allergic state   . Anemia   . Anginal pain ([place])   . Anxiety   . Arthritis    osteoarthritis  . Cancer ([place])    skin  . Chicken pox   . Coronary artery disease   . Depression   . Eczema   . Fibromyalgia   . GERD (gastroesophageal reflux disease)   . Headache    migraines  . Hemorrhoids   . History of hiatal hernia   . Hypercalcemia   . Hyperlipidemia   . Hypertension   . Hypothyroidism   . Lumbar stenosis   . Obesity   . Peptic ulcer disease   . Shortness of breath dyspnea   . Sleep apnea   . Vertigo   . Vitamin D deficiency     There are no active problems to display for this patient.   Past Surgical History:  Procedure Laterality Date  . ABDOMINAL HYSTERECTOMY    . BACK SURGERY  2007   Dr. Mauri Pole, [facility], Spinal Fusion  . CARDIAC  CATHETERIZATION N/A 04/03/2015   Procedure: Left Heart Cath and Coronary Angiography;  Surgeon: Teodoro Spray, MD;  Location: [facility];  Service: Cardiovascular;  Laterality: N/A;  . CHOLECYSTECTOMY    . CORONARY ANGIOPLASTY  2015   Dr. Ubaldo Glassing, [facility] Cath Lab  . ESOPHAGOGASTRODUODENOSCOPY (EGD) WITH PROPOFOL N/A 12/21/2016   Procedure: ESOPHAGOGASTRODUODENOSCOPY (EGD) WITH PROPOFOL;  Surgeon: Manya Silvas, MD;  Location: [facility] ENDOSCOPY;  Service: Endoscopy;  Laterality: N/A;  . FRACTURE SURGERY Left 1991   Fractures Femur, [place] GUIDED SINUS SURGERY Bilateral 09/28/2015   Procedure: IMAGE GUIDED SINUS SURGERY, SEPTOPLASTY, BILATERAL FRONTAL SINUSOTOMIES, BILATERAL MAXILLARY ANTROSTOMIES, BILATERAL TOTAL ETHMOIDECTOMY, BILATERAL SPHENOIDECTOMY, BILATERAL INFERIOR TURBINATE REDUCTION;  Surgeon: Margaretha Sheffield, MD;  Location: [facility] ORS;  Service: ENT;  Laterality: Bilateral;    OB History    No data available       Home Medications    Prior to Admission medications   Medication Sig Start Date End Date Taking? Authorizing Provider  acetaminophen (TYLENOL) 500 MG tablet Take 500 mg by mouth every 6 (six) hours as needed.    [provider]  albuterol (PROVENTIL HFA;VENTOLIN HFA) 108 (90 BASE) MCG/ACT inhaler Inhale 2 puffs into the lungs.    [provider]  amLODipine (NORVASC) 5 MG tablet Take 5 mg by mouth 2 (two) times daily.    [provider]  celecoxib (CELEBREX) 200 MG capsule Take 200 mg by mouth 3 (three) times a week. prn    [provider]  ciprofloxacin (CIPRO) 500 MG tablet Take 1 tablet (500 mg total) by mouth every 12 (twelve) hours. 12/24/16   Duanne Guess, PA-C  citalopram (CELEXA) 40 MG tablet Take 40 mg by mouth daily.    [provider]  clonazePAM (KLONOPIN) 1 MG tablet Take 1 mg by mouth 2 (two) times daily. As needed for anxiety    [provider]  enalapril (VASOTEC) 20 MG tablet Take  20 mg by mouth daily.    [provider]  ergocalciferol (VITAMIN D2) 50000 UNITS capsule Take 50,000 Units by mouth every 30 (thirty) days.    [provider]  fluticasone (VERAMYST) 27.5 MCG/SPRAY nasal spray Place 2 sprays into the nose daily.    [provider]  gabapentin (NEURONTIN) 300 MG capsule Take 600 mg by mouth 3 (three) times daily. Two tabs to total 600 mgm by mouth 3 times daily    [provider]  HYDROcodone-acetaminophen (NORCO/VICODIN) 5-325 MG tablet Take 1 tablet by mouth every 4 (four) hours as needed for moderate pain.    [provider]  levothyroxine (SYNTHROID, LEVOTHROID) 112 MCG tablet Take 137 mcg by mouth daily before breakfast.     [provider]  meclizine (ANTIVERT) 25 MG tablet Take 25 mg by mouth 3 (three) times daily as needed for dizziness.    [provider]  methylPREDNISolone (MEDROL DOSEPAK) 4 MG TBPK tablet Take per package instructions 02/07/17   Crecencio Mc P, PA-C  metoprolol tartrate (LOPRESSOR) 25 MG tablet Take 12.5 mg by mouth 2 (two) times daily.     [provider]  nitroGLYCERIN (NITROSTAT) 0.4 MG SL tablet Place 0.4 mg under the tongue every 5 (five) minutes as needed for chest pain.    [provider]  omeprazole (PRILOSEC) 20 MG capsule Take 40 mg by mouth 2 (two) times daily before a meal.     [provider]  phenazopyridine (PYRIDIUM) 95 MG tablet Take 95 mg by mouth 3 (three) times daily as needed for pain.    [provider]  pravastatin (PRAVACHOL) 40 MG tablet Take 40 mg by mouth at bedtime.    [provider]  predniSONE (DELTASONE) 10 MG tablet Take 10 mg by mouth daily with breakfast.    [provider]  risperiDONE (RISPERDAL) 0.25 MG tablet Take 0.25 mg by mouth at bedtime.    [provider]    Family History Family History  Problem Relation Age of Onset  . Breast cancer Neg Hx     Social  History Social History  Substance Use Topics  . Smoking status: Former Research scientist (life sciences)  . Smokeless tobacco: Never Used  . Alcohol use No     Allergies   Zithromax [azithromycin]; Adhesive [tape]; Codeine; Lactose intolerance (gi); Lodine [etodolac]; Morphine and related; Red dye; Shellfish-derived products; and Latex   Review of Systems Review of Systems  Constitutional: Positive for activity change. Negative for chills, fatigue and fever.  Musculoskeletal: Positive for back pain.  All other systems reviewed and are negative.    Physical Exam Triage Vital Signs ED Triage Vitals  Enc Vitals  Group     BP 02/07/17 0921 (!) 143/73     Pulse Rate 02/07/17 0921 60     Resp 02/07/17 0921 18     Temp 02/07/17 0921 97.7 F (36.5 C)     Temp Source 02/07/17 0921 Oral     SpO2 02/07/17 0921 99 %     Weight 02/07/17 0916 214 lb (97.1 kg)     Height 02/07/17 0916 5\' 3"  (1.6 m)     Head Circumference --      Peak Flow --      Pain Score 02/07/17 0916 7     Pain Loc --      Pain Edu? --      Excl. in [place]? --    No data found.   Updated Vital Signs BP (!) 143/73 (BP Location: Right Arm)   Pulse 60   Temp 97.7 F (36.5 C) (Oral)   Resp 18   Ht 5\' 3"  (1.6 m)   Wt 214 lb (97.1 kg)   SpO2 99%   BMI 37.91 kg/m   Visual Acuity Right Eye Distance:   Left Eye Distance:   Bilateral Distance:    Right Eye Near:   Left Eye Near:    Bilateral Near:     Physical Exam  Constitutional: She is oriented to person, place, and time. She appears well-developed and well-nourished. No distress.  HENT:  Head: Normocephalic.  Eyes: Pupils are equal, round, and reactive to light. Right eye exhibits no discharge. Left eye exhibits no discharge.  Neck: Normal range of motion.  Musculoskeletal: She exhibits tenderness.  Examination of the patient shows her to ambulate with a cane. She has very limited range of motion due to pain. Tenderness is maximal at the left sciatic notch. Patient does not  have any hip range of motion tenderness or loss.  Neurological: She is alert and oriented to person, place, and time.  Skin: Skin is warm and dry. She is not diaphoretic.  Psychiatric: She has a normal mood and affect. Her behavior is normal. Thought content normal.  Nursing note and vitals reviewed.    UC Treatments / Results  Labs (all labs ordered are listed, but only abnormal results are displayed) Labs Reviewed - No data to display  EKG  EKG Interpretation None       Radiology No results found.  Procedures Procedures (including critical care time)  Medications Ordered in UC Medications - No data to display   Initial Impression / Assessment and Plan / UC Course  I have reviewed the triage vital signs and the nursing notes.  Pertinent labs & imaging results that were available during my care of the patient were reviewed by me and considered in my medical decision making (see chart for details). Plan: 1. Test/x-ray results and diagnosis reviewed with patient 2. rx as per orders; risks, benefits, potential side effects reviewed with patient 3. Recommend supportive treatment with ice and avoidance of symptoms as much as feasible. She can continue using her Voltaren gel but I have discussed with her that she has contraindication to nonsteroidals and the Voltaren is a first generation Cox 1. She'll need to discuss this with her primary. I must recommend she follow-up with her primary tomorrow for further evaluation and decision on how to help long-term with her pain. 4. F/u prn if symptoms worsen or don't improve       Final Clinical Impressions(s) / UC Diagnoses   Final diagnoses:  Sciatica of left  side    New Prescriptions Discharge Medication List as of 02/07/2017  9:53 AM    START taking these medications   Details  methylPREDNISolone (MEDROL DOSEPAK) 4 MG TBPK tablet Take per package instructions, Normal         Controlled Substance Prescriptions [place]  Controlled Substance Registry consulted? Not Applicable   Lorin Picket, PA-C 02/07/17 1125

## 2017-02-07 NOTE — ED Triage Notes (Signed)
Pt reports "sciatica" from left hip all the way down her left leg to ankle. Pain has been x months. Sees chiropractor for same. Has been using Voltaren gel and ice. Current pain 7/10

## 2017-02-14 ENCOUNTER — Ambulatory Visit
Admission: EM | Admit: 2017-02-14 | Discharge: 2017-02-14 | Disposition: A | Discharge status: Home / Self Care | Payer: Medicare HMO | Attending: Family Medicine | Admitting: Family Medicine

## 2017-02-14 DIAGNOSIS — T63481A Toxic effect of venom of other arthropod, accidental (unintentional), initial encounter: Secondary | ICD-10-CM | POA: Diagnosis not present

## 2017-02-14 DIAGNOSIS — W57XXXA Bitten or stung by nonvenomous insect and other nonvenomous arthropods, initial encounter: Secondary | ICD-10-CM

## 2017-02-14 DIAGNOSIS — S90861A Insect bite (nonvenomous), right foot, initial encounter: Secondary | ICD-10-CM

## 2017-02-14 MED ORDER — LORATADINE 10 MG PO TABS: 10.0000 mg | ORAL_TABLET | Freq: Every day | ORAL | 0 refills | Status: DC

## 2017-02-14 MED ORDER — CETIRIZINE HCL 10 MG PO TABS: 10.0000 mg | ORAL_TABLET | Freq: Every day | ORAL | 0 refills | Status: DC

## 2017-02-14 MED ORDER — FAMOTIDINE 20 MG PO TABS
20.0000 mg | ORAL_TABLET | Freq: Once | ORAL | Status: AC
  Administered 2017-02-14: 20 mg via ORAL

## 2017-02-14 MED ORDER — RANITIDINE HCL 150 MG PO CAPS: 150.0000 mg | ORAL_CAPSULE | Freq: Two times a day (BID) | ORAL | 0 refills | Status: DC

## 2017-02-14 MED ORDER — EPINEPHRINE 0.3 MG/0.3ML IJ SOAJ: 0.3000 mg | Freq: Once | INTRAMUSCULAR | 0 refills | Status: AC

## 2017-02-14 MED ORDER — SULFAMETHOXAZOLE-TRIMETHOPRIM 800-160 MG PO TABS: 1.0000 | ORAL_TABLET | Freq: Two times a day (BID) | ORAL | 0 refills | Status: DC

## 2017-02-14 MED ORDER — METHYLPREDNISOLONE SODIUM SUCC 125 MG IJ SOLR
125.0000 mg | Freq: Once | INTRAMUSCULAR | Status: AC
  Administered 2017-02-14: 125 mg via INTRAMUSCULAR

## 2017-02-14 MED ORDER — EPINEPHRINE PF 1 MG/ML IJ SOLN
0.3000 mg | Freq: Once | INTRAMUSCULAR | Status: AC
  Administered 2017-02-14: 0.3 mg via SUBCUTANEOUS

## 2017-02-14 NOTE — ED Triage Notes (Signed)
Pt said she was stung on her right foot today by a wasp and now has welps on her stomach and Now has a swollen foot and hurts to walk. Said she did does have a sore throat now, does have cat bite on her right arm which is now aggravated and swollen. Said she did take benadryl and paste on her foot. Still having itching on her back and stomach. Unsure of her throat is closing up but said she did use throat spray before she left home. Michela Pitcher this happened about 1:30 today. Pt is talking and breathing normally. NO SOB or wheezing.

## 2017-02-14 NOTE — ED Provider Notes (Signed)
[facility]    CSN: [id number] Arrival date & time: 02/14/17  1734  Patient is a 76 year old white female who's had a history of allergic reactions to wasp stings. She reports coming from [employer] this morning when a wasp stung her on the bottom of her right foot on the outer portion of the right foot. She reports she did not have anymore for EpiPen left or another EpiPen she went home with some medicinal over-the-counter medication on it and try to treat it herself. This afternoon she woke up the foot was swollen red she also feels a sore throat she feels swollen all over her arms and in her chest and neck. She has had a cat bite on her right arm which is now she states since the while staying is gotten swollen as well. She feels miserable and she is itching all over. She is definitely allergic to Zithromax and she does tolerate penicillin well she is a former smoker family history is positive for breast cancer she's had multiple medical problems such as colonic polyps anemia arthritis skin cancer fibromyalgia hypertension hypothyroidism obesity peptic ulcer disease dyspnea and sleep apnea. She's had abdominal hysterectomy cardiac catheterization fracture surgery and sinus surgery before in the past    History   Chief Complaint Chief Complaint  Patient presents with  . Insect Bite    HPI Stacy Allen is a 76 y.o. female.   The history is provided by the patient. No language interpreter was used.    Past Medical History:  Diagnosis Date  . Adenomatous polyps   . Allergic state   . Anemia   . Anginal pain ([place])   . Anxiety   . Arthritis    osteoarthritis  . Cancer ([place])    skin  . Chicken pox   . Coronary artery disease   . Depression   . Eczema   . Fibromyalgia   . GERD (gastroesophageal reflux disease)   . Headache    migraines  . Hemorrhoids   . History of hiatal hernia   . Hypercalcemia   . Hyperlipidemia   . Hypertension   . Hypothyroidism   .  Lumbar stenosis   . Obesity   . Peptic ulcer disease   . Shortness of breath dyspnea   . Sleep apnea   . Vertigo   . Vitamin D deficiency     There are no active problems to display for this patient.   Past Surgical History:  Procedure Laterality Date  . ABDOMINAL HYSTERECTOMY    . BACK SURGERY  2007   Dr. Mauri Pole, [facility], Spinal Fusion  . CARDIAC CATHETERIZATION N/A 04/03/2015   Procedure: Left Heart Cath and Coronary Angiography;  Surgeon: Teodoro Spray, MD;  Location: [facility];  Service: Cardiovascular;  Laterality: N/A;  . CHOLECYSTECTOMY    . CORONARY ANGIOPLASTY  2015   Dr. Ubaldo Glassing, [facility] Cath Lab  . ESOPHAGOGASTRODUODENOSCOPY (EGD) WITH PROPOFOL N/A 12/21/2016   Procedure: ESOPHAGOGASTRODUODENOSCOPY (EGD) WITH PROPOFOL;  Surgeon: Manya Silvas, MD;  Location: [facility] ENDOSCOPY;  Service: Endoscopy;  Laterality: N/A;  . FRACTURE SURGERY Left 1991   Fractures Femur, [place] GUIDED SINUS SURGERY Bilateral 09/28/2015   Procedure: IMAGE GUIDED SINUS SURGERY, SEPTOPLASTY, BILATERAL FRONTAL SINUSOTOMIES, BILATERAL MAXILLARY ANTROSTOMIES, BILATERAL TOTAL ETHMOIDECTOMY, BILATERAL SPHENOIDECTOMY, BILATERAL INFERIOR TURBINATE REDUCTION;  Surgeon: Margaretha Sheffield, MD;  Location: [facility] ORS;  Service: ENT;  Laterality: Bilateral;    OB History    No data available  Home Medications    Prior to Admission medications   Medication Sig Start Date End Date Taking? Authorizing Provider  acetaminophen (TYLENOL) 500 MG tablet Take 500 mg by mouth every 6 (six) hours as needed.   Yes [provider]  albuterol (PROVENTIL HFA;VENTOLIN HFA) 108 (90 BASE) MCG/ACT inhaler Inhale 2 puffs into the lungs.   Yes [provider]  amLODipine (NORVASC) 5 MG tablet Take 5 mg by mouth 2 (two) times daily.   Yes [provider]  celecoxib (CELEBREX) 200 MG capsule Take 200 mg by mouth 3 (three) times a week. prn   Yes [provider]    ciprofloxacin (CIPRO) 500 MG tablet Take 1 tablet (500 mg total) by mouth every 12 (twelve) hours. 12/24/16  Yes Duanne Guess, PA-C  citalopram (CELEXA) 40 MG tablet Take 40 mg by mouth daily.   Yes [provider]  clonazePAM (KLONOPIN) 1 MG tablet Take 1 mg by mouth 2 (two) times daily. As needed for anxiety   Yes [provider]  enalapril (VASOTEC) 20 MG tablet Take 20 mg by mouth daily.   Yes [provider]  ergocalciferol (VITAMIN D2) 50000 UNITS capsule Take 50,000 Units by mouth every 30 (thirty) days.   Yes [provider]  fluticasone (VERAMYST) 27.5 MCG/SPRAY nasal spray Place 2 sprays into the nose daily.   Yes [provider]  gabapentin (NEURONTIN) 300 MG capsule Take 600 mg by mouth 3 (three) times daily. Two tabs to total 600 mgm by mouth 3 times daily   Yes [provider]  HYDROcodone-acetaminophen (NORCO/VICODIN) 5-325 MG tablet Take 1 tablet by mouth every 4 (four) hours as needed for moderate pain.   Yes [provider]  levothyroxine (SYNTHROID, LEVOTHROID) 112 MCG tablet Take 137 mcg by mouth daily before breakfast.    Yes [provider]  meclizine (ANTIVERT) 25 MG tablet Take 25 mg by mouth 3 (three) times daily as needed for dizziness.   Yes [provider]  methylPREDNISolone (MEDROL DOSEPAK) 4 MG TBPK tablet Take per package instructions 02/07/17  Yes Crecencio Mc P, PA-C  metoprolol tartrate (LOPRESSOR) 25 MG tablet Take 12.5 mg by mouth 2 (two) times daily.    Yes [provider]  nitroGLYCERIN (NITROSTAT) 0.4 MG SL tablet Place 0.4 mg under the tongue every 5 (five) minutes as needed for chest pain.   Yes [provider]  omeprazole (PRILOSEC) 20 MG capsule Take 40 mg by mouth 2 (two) times daily before a meal.    Yes [provider]  phenazopyridine (PYRIDIUM) 95 MG tablet Take 95 mg by mouth 3 (three) times daily as needed for pain.   Yes [provider]  pravastatin (PRAVACHOL) 40 MG tablet Take 40 mg by mouth at bedtime.   Yes [provider]  predniSONE (DELTASONE) 10 MG tablet Take 10 mg by mouth daily with breakfast.   Yes [provider]  risperiDONE (RISPERDAL) 0.25 MG tablet Take 0.25 mg by mouth at bedtime.   Yes [provider]  cetirizine (ZYRTEC) 10 MG tablet Take 1 tablet (10 mg total) by mouth daily. If needed at night for itching not relieved by Claritin in the morning. 02/14/17   Frederich Cha, MD  EPINEPHrine 0.3 mg/0.3 mL IJ SOAJ injection Inject 0.3 mLs (0.3 mg total) into the muscle once. 02/14/17 02/14/17  Frederich Cha, MD  loratadine (CLARITIN) 10 MG tablet Take 1 tablet (10 mg total) by mouth daily. Take 1 tablet in  the morning. As needed for itching. 02/14/17   Frederich Cha, MD  ranitidine (ZANTAC) 150 MG capsule Take 1 capsule (150 mg total) by mouth 2 (two) times daily. 02/14/17   Frederich Cha, MD  sulfamethoxazole-trimethoprim (BACTRIM DS,SEPTRA DS) 800-160 MG tablet Take 1 tablet by mouth 2 (two) times daily. 02/14/17   Frederich Cha, MD    Family History Family History  Problem Relation Age of Onset  . Breast cancer Neg Hx     Social History Social History  Substance Use Topics  . Smoking status: Former Research scientist (life sciences)  . Smokeless tobacco: Never Used  . Alcohol use No     Allergies   Zithromax [azithromycin]; Adhesive [tape]; Codeine; Lactose intolerance (gi); Lodine [etodolac]; Morphine and related; Red dye; Shellfish-derived products; and Latex   Review of Systems Review of Systems  Constitutional: Positive for activity change.  HENT: Positive for facial swelling, sore throat and trouble swallowing.   Respiratory: Positive for choking and stridor.   Skin: Positive for color change.     Physical Exam Triage Vital Signs ED Triage Vitals  Enc Vitals Group     BP 02/14/17 1815 (!) 150/95     Pulse Rate 02/14/17 1814 87     Resp 02/14/17 1814 18     Temp 02/14/17 1814  98.8 F (37.1 C)     Temp Source 02/14/17 1814 Oral     SpO2 02/14/17 1814 98 %     Weight --      Height --      Head Circumference --      Peak Flow --      Pain Score 02/14/17 1818 6     Pain Loc --      Pain Edu? --      Excl. in [place]? --    No data found.   Updated Vital Signs BP (!) 150/95   Pulse 87   Temp 98.8 F (37.1 C) (Oral)   Resp 18   SpO2 98%   Visual Acuity Right Eye Distance:   Left Eye Distance:   Bilateral Distance:    Right Eye Near:   Left Eye Near:    Bilateral Near:     Physical Exam  Constitutional: She is oriented to person, place, and time. She appears well-developed and well-nourished. She appears distressed.  HENT:  Head: Normocephalic and atraumatic.  Right Ear: Hearing, tympanic membrane, external ear and ear canal normal.  Left Ear: Hearing, tympanic membrane, external ear and ear canal normal.  Nose: Mucosal edema present.  Mouth/Throat: Uvula is midline, oropharynx is clear and moist and mucous membranes are normal. No oral lesions. No uvula swelling.  Eyes: Pupils are equal, round, and reactive to light. Conjunctivae are normal.  Neck: Normal range of motion. Neck supple.  Cardiovascular: Normal rate, regular rhythm and normal heart sounds.   Pulmonary/Chest: Effort normal and breath sounds normal.  Musculoskeletal: Normal range of motion. She exhibits edema. She exhibits no deformity.       Right ankle: She exhibits swelling and ecchymosis.       Right foot: There is tenderness and swelling.       Feet:  Neurological: She is alert and oriented to person, place, and time.  Skin: Rash noted. There is erythema.  Psychiatric: Her mood appears anxious.  Vitals reviewed.    UC Treatments / Results  Labs (all labs ordered are listed, but only abnormal results are displayed) Labs Reviewed - No data to display  EKG  EKG  Interpretation None       Radiology No results found.  Procedures Procedures (including critical care  time)  Medications Ordered in UC Medications  EPINEPHrine (ADRENALIN) 0.3 mg (not administered)  methylPREDNISolone sodium succinate (SOLU-MEDROL) 125 mg/2 mL injection 125 mg (not administered)  famotidine (PEPCID) tablet 20 mg (not administered)     Initial Impression / Assessment and Plan / UC Course  I have reviewed the triage vital signs and the nursing notes.  Pertinent labs & imaging results that were available during my care of the patient were reviewed by me and considered in my medical decision making (see chart for details).     Patient will be given a new prescription for EpiPen Claritin 10 mg in a.m. instead of Benadryl Zyrtec at night as needed and Zantac twice a day for at least 1-2 weeks. While she's here she'll be given injection of epinephrine Solu-Medrol for the itching. And for the reaction to wasp and. Since she's also had wrist weeks upper right lower leg as well as swelling this could be early cellulitis from the wasp sting also place on Septra DS as well. Since she's really taken Benadryl were also place on Pepcid 1 dose while she's here for follow-up PCP as needed.  Final Clinical Impressions(s) / UC Diagnoses   Final diagnoses:  Bug bite with infection, initial encounter  Allergic reaction to insect sting, accidental or unintentional, initial encounter    New Prescriptions New Prescriptions   CETIRIZINE (ZYRTEC) 10 MG TABLET    Take 1 tablet (10 mg total) by mouth daily. If needed at night for itching not relieved by Claritin in the morning.   EPINEPHRINE 0.3 MG/0.3 ML IJ SOAJ INJECTION    Inject 0.3 mLs (0.3 mg total) into the muscle once.   LORATADINE (CLARITIN) 10 MG TABLET    Take 1 tablet (10 mg total) by mouth daily. Take 1 tablet in the morning. As needed for itching.   RANITIDINE (ZANTAC) 150 MG CAPSULE    Take 1 capsule (150 mg total) by mouth 2 (two) times daily.   SULFAMETHOXAZOLE-TRIMETHOPRIM (BACTRIM DS,SEPTRA DS) 800-160 MG TABLET    Take 1  tablet by mouth 2 (two) times daily.     Controlled Substance Prescriptions [place] Controlled Substance Registry consulted? Not Applicable   Frederich Cha, MD 02/14/17 Lurena Nida

## 2017-03-24 ENCOUNTER — Other Ambulatory Visit: Payer: Self-pay | Admitting: Family Medicine

## 2017-03-24 DIAGNOSIS — Z13820 Encounter for screening for osteoporosis: Secondary | ICD-10-CM

## 2017-04-03 ENCOUNTER — Other Ambulatory Visit: Payer: Self-pay | Admitting: Neurosurgery

## 2017-04-03 DIAGNOSIS — Z981 Arthrodesis status: Secondary | ICD-10-CM

## 2017-04-06 ENCOUNTER — Ambulatory Visit: Payer: Medicare HMO

## 2017-04-07 ENCOUNTER — Ambulatory Visit
Admission: RE | Admit: 2017-04-07 | Discharge: 2017-04-07 | Disposition: A | Discharge status: Home / Self Care | Payer: Medicare HMO | Source: Ambulatory Visit | Attending: Neurosurgery | Admitting: Neurosurgery

## 2017-04-07 DIAGNOSIS — M5136 Other intervertebral disc degeneration, lumbar region: Secondary | ICD-10-CM | POA: Insufficient documentation

## 2017-04-07 DIAGNOSIS — M5416 Radiculopathy, lumbar region: Secondary | ICD-10-CM | POA: Diagnosis present

## 2017-04-07 DIAGNOSIS — M4316 Spondylolisthesis, lumbar region: Secondary | ICD-10-CM | POA: Insufficient documentation

## 2017-04-07 DIAGNOSIS — Z981 Arthrodesis status: Secondary | ICD-10-CM

## 2017-04-07 DIAGNOSIS — M48061 Spinal stenosis, lumbar region without neurogenic claudication: Secondary | ICD-10-CM | POA: Insufficient documentation

## 2017-04-25 ENCOUNTER — Ambulatory Visit
Admission: RE | Admit: 2017-04-25 | Discharge: 2017-04-25 | Disposition: A | Discharge status: Home / Self Care | Payer: Medicare HMO | Source: Ambulatory Visit | Attending: Family Medicine | Admitting: Family Medicine

## 2017-04-25 DIAGNOSIS — Z780 Asymptomatic menopausal state: Secondary | ICD-10-CM | POA: Insufficient documentation

## 2017-04-25 DIAGNOSIS — Z13820 Encounter for screening for osteoporosis: Secondary | ICD-10-CM

## 2017-04-25 DIAGNOSIS — M810 Age-related osteoporosis without current pathological fracture: Secondary | ICD-10-CM | POA: Diagnosis not present

## 2017-06-01 ENCOUNTER — Other Ambulatory Visit: Payer: Self-pay

## 2017-06-01 ENCOUNTER — Encounter: Payer: Self-pay | Admitting: Student in an Organized Health Care Education/Training Program

## 2017-06-01 ENCOUNTER — Ambulatory Visit
Payer: Medicare HMO | Attending: Student in an Organized Health Care Education/Training Program | Admitting: Student in an Organized Health Care Education/Training Program

## 2017-06-01 VITALS — BP 154/85 | HR 72 | Temp 97.7°F | Resp 18 | Ht 63.0 in | Wt 225.0 lb

## 2017-06-01 DIAGNOSIS — Z859 Personal history of malignant neoplasm, unspecified: Secondary | ICD-10-CM | POA: Diagnosis not present

## 2017-06-01 DIAGNOSIS — I10 Essential (primary) hypertension: Secondary | ICD-10-CM | POA: Diagnosis not present

## 2017-06-01 DIAGNOSIS — M5136 Other intervertebral disc degeneration, lumbar region: Secondary | ICD-10-CM | POA: Insufficient documentation

## 2017-06-01 DIAGNOSIS — Z8711 Personal history of peptic ulcer disease: Secondary | ICD-10-CM | POA: Insufficient documentation

## 2017-06-01 DIAGNOSIS — Z6839 Body mass index (BMI) 39.0-39.9, adult: Secondary | ICD-10-CM | POA: Insufficient documentation

## 2017-06-01 DIAGNOSIS — I2510 Atherosclerotic heart disease of native coronary artery without angina pectoris: Secondary | ICD-10-CM | POA: Insufficient documentation

## 2017-06-01 DIAGNOSIS — F419 Anxiety disorder, unspecified: Secondary | ICD-10-CM | POA: Diagnosis not present

## 2017-06-01 DIAGNOSIS — M47816 Spondylosis without myelopathy or radiculopathy, lumbar region: Secondary | ICD-10-CM

## 2017-06-01 DIAGNOSIS — M51369 Other intervertebral disc degeneration, lumbar region without mention of lumbar back pain or lower extremity pain: Secondary | ICD-10-CM | POA: Insufficient documentation

## 2017-06-01 DIAGNOSIS — M461 Sacroiliitis, not elsewhere classified: Secondary | ICD-10-CM | POA: Insufficient documentation

## 2017-06-01 DIAGNOSIS — Z87891 Personal history of nicotine dependence: Secondary | ICD-10-CM | POA: Diagnosis not present

## 2017-06-01 DIAGNOSIS — E039 Hypothyroidism, unspecified: Secondary | ICD-10-CM | POA: Insufficient documentation

## 2017-06-01 DIAGNOSIS — Z91048 Other nonmedicinal substance allergy status: Secondary | ICD-10-CM | POA: Insufficient documentation

## 2017-06-01 DIAGNOSIS — G4730 Sleep apnea, unspecified: Secondary | ICD-10-CM | POA: Insufficient documentation

## 2017-06-01 DIAGNOSIS — Z91040 Latex allergy status: Secondary | ICD-10-CM | POA: Insufficient documentation

## 2017-06-01 DIAGNOSIS — Z888 Allergy status to other drugs, medicaments and biological substances status: Secondary | ICD-10-CM | POA: Diagnosis not present

## 2017-06-01 DIAGNOSIS — M47818 Spondylosis without myelopathy or radiculopathy, sacral and sacrococcygeal region: Secondary | ICD-10-CM | POA: Insufficient documentation

## 2017-06-01 DIAGNOSIS — M810 Age-related osteoporosis without current pathological fracture: Secondary | ICD-10-CM | POA: Insufficient documentation

## 2017-06-01 DIAGNOSIS — Z955 Presence of coronary angioplasty implant and graft: Secondary | ICD-10-CM | POA: Insufficient documentation

## 2017-06-01 DIAGNOSIS — Z90710 Acquired absence of both cervix and uterus: Secondary | ICD-10-CM | POA: Insufficient documentation

## 2017-06-01 DIAGNOSIS — Z881 Allergy status to other antibiotic agents status: Secondary | ICD-10-CM | POA: Diagnosis not present

## 2017-06-01 DIAGNOSIS — E559 Vitamin D deficiency, unspecified: Secondary | ICD-10-CM | POA: Diagnosis not present

## 2017-06-01 DIAGNOSIS — M1990 Unspecified osteoarthritis, unspecified site: Secondary | ICD-10-CM | POA: Diagnosis not present

## 2017-06-01 DIAGNOSIS — Z9049 Acquired absence of other specified parts of digestive tract: Secondary | ICD-10-CM | POA: Insufficient documentation

## 2017-06-01 DIAGNOSIS — Z885 Allergy status to narcotic agent status: Secondary | ICD-10-CM | POA: Diagnosis not present

## 2017-06-01 DIAGNOSIS — M4698 Unspecified inflammatory spondylopathy, sacral and sacrococcygeal region: Secondary | ICD-10-CM

## 2017-06-01 DIAGNOSIS — G894 Chronic pain syndrome: Secondary | ICD-10-CM | POA: Insufficient documentation

## 2017-06-01 DIAGNOSIS — E669 Obesity, unspecified: Secondary | ICD-10-CM | POA: Diagnosis not present

## 2017-06-01 DIAGNOSIS — Z981 Arthrodesis status: Secondary | ICD-10-CM | POA: Insufficient documentation

## 2017-06-01 DIAGNOSIS — Z91013 Allergy to seafood: Secondary | ICD-10-CM | POA: Diagnosis not present

## 2017-06-01 DIAGNOSIS — Z9889 Other specified postprocedural states: Secondary | ICD-10-CM | POA: Insufficient documentation

## 2017-06-01 DIAGNOSIS — E785 Hyperlipidemia, unspecified: Secondary | ICD-10-CM | POA: Insufficient documentation

## 2017-06-01 DIAGNOSIS — M797 Fibromyalgia: Secondary | ICD-10-CM | POA: Insufficient documentation

## 2017-06-01 DIAGNOSIS — Z8619 Personal history of other infectious and parasitic diseases: Secondary | ICD-10-CM | POA: Insufficient documentation

## 2017-06-01 DIAGNOSIS — F329 Major depressive disorder, single episode, unspecified: Secondary | ICD-10-CM | POA: Insufficient documentation

## 2017-06-01 HISTORY — DX: Sacroiliitis, not elsewhere classified: M46.1

## 2017-06-01 HISTORY — DX: Spondylosis without myelopathy or radiculopathy, lumbar region: M47.816

## 2017-06-01 NOTE — Progress Notes (Signed)
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 IMAGE GUIDED SINUS SURGERY, SEPTOPLASTY, BILATERAL FRONTAL SINUSOTOMIES, BILATERAL MAXILLARY ANTROSTOMIES, BILATERAL TOTAL ETHMOIDECTOMY, BILATERAL SPHENOIDECTOMY, BILATERAL INFERIOR TURBINATE REDUCTION;  Surgeon: Margaretha Sheffield, MD;  Location: [facility] OR[facility];  Service: ENT;  Laterality: Bilateral;   Active Ambulatory Problems    Diagnosis Date Noted  . Lumbar spondylosis 06/01/2017  . Lumbar degenerative disc disease 06/01/2017  . History of lumbar fusion 06/01/2017  . 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 Chronic pain syndrome 06/01/2017  . 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 No Resolved Ambulatory Problems   Past Medical History:  Diagnosis Date  . Adenomatous polyps   . Allergic state   . Anemia   . Anginal pain ([place])   . Anxiety   . Arthritis   . Cancer ([place])   . Chicken pox   . Coronary artery disease   . Depression   . Eczema   .  Fibromyalgia   . GERD (gastroesophageal reflux disease)   . Headache   . Hemorrhoids     . History of hiatal hernia   . Hypercalcemia   . Hyperlipidemia   . Hypertension   . Hypothyroidism   . Lumbar stenosis   . Obesity   . Peptic ulcer disease   . Shortness of breath dyspnea   . Sleep apnea   . Vertigo   . Vitamin D deficiency    Constitutional Exam  General appearance: morbidly obese and Well nourished, well developed, and well hydrated. In no apparent acute distress Vitals:   06/01/17 1120  BP: (!) 154/85  Pulse: 72  Resp: 18  Temp: 97.7 F (36.5 C)  TempSrc: Oral  SpO2: 99%  Weight: 225 lb (102.1 kg)  Height: 5' 3"  (1.6 m)   BMI Assessment: Estimated body mass index is 39.86 kg/m as calculated from the following:   Height as of this encounter: 5' 3"  (1.6 m).   Weight as of this encounter: 225 lb (102.1 kg).  BMI interpretation table: BMI level Category Range association with higher incidence of chronic pain  <18 kg/m2 Underweight   18.5-24.9 kg/m2 Ideal body weight   25-29.9 kg/m2 Overweight Increased incidence by 20%  30-34.9 kg/m2 Obese (Class I) Increased incidence by 68%  35-39.9 kg/m2 Severe obesity (Class II) Increased incidence by 136%  >40 kg/m2 Extreme obesity (Class III) Increased incidence by 254%   BMI Readings from Last 4 Encounters:  06/01/17 39.86 kg/m  02/07/17 37.91 kg/m  12/24/16 38.44 kg/m  12/21/16 37.25 kg/m   Wt Readings from Last 4 Encounters:  06/01/17 225 lb (102.1 kg)  02/07/17 214 lb (97.1 kg)  12/24/16 217 lb (98.4 kg)  12/21/16 217 lb (98.4 kg)  Psych/Mental status: Alert, oriented x 3 (person, place, & time)       Eyes: PERLA Respiratory: No evidence of acute respiratory distress  Cervical Spine Area Exam  Skin & Axial Inspection: No masses, redness, edema, swelling, or associated skin lesions Alignment: Symmetrical Functional ROM: Unrestricted ROM      Stability: No instability detected Muscle Tone/Strength: Functionally intact. No obvious neuro-muscular anomalies detected. Sensory (Neurological):  Unimpaired Palpation: No palpable anomalies              Upper Extremity (UE) Exam    Side: Right upper extremity  Side: Left upper extremity  Skin & Extremity Inspection: Skin color, temperature, and hair growth are WNL. No peripheral edema or cyanosis. No masses, redness, swelling, asymmetry, or associated skin lesions. No contractures.  Skin & Extremity Inspection: Skin color, temperature, and hair growth are WNL. No peripheral edema or cyanosis. No masses, redness, swelling, asymmetry, or associated skin lesions. No contractures.  Functional ROM: Unrestricted ROM          Functional ROM: Unrestricted ROM          Muscle Tone/Strength: Functionally intact. No obvious neuro-muscular anomalies detected.  Muscle Tone/Strength: Functionally intact. No obvious neuro-muscular anomalies detected.  Sensory (Neurological): Unimpaired          Sensory (Neurological): Unimpaired          Palpation: No palpable anomalies              Palpation: No palpable anomalies              Specialized Test(s): Deferred         Specialized Test(s): Deferred          Thoracic Spine Area Exam  Skin &  Axial Inspection: No masses, redness, or swelling Alignment: Symmetrical Functional ROM: Unrestricted ROM Stability: No instability detected Muscle Tone/Strength: Functionally intact. No obvious neuro-muscular anomalies detected. Sensory (Neurological): Unimpaired Muscle strength & Tone: No palpable anomalies  Lumbar Spine Area Exam  Skin & Axial Inspection: Well healed scar from previous spine surgery detected Alignment: Symmetrical Functional ROM: Decreased ROM, bilaterally with lumbar extension Stability: No instability detected Muscle Tone/Strength: Functionally intact. No obvious neuro-muscular anomalies detected. Sensory (Neurological): Articular pain pattern Palpation: Complains of area being tender to palpation       Provocative Tests: Lumbar Hyperextension and rotation test: evaluation deferred today  bilaterally for facet joint pain. Lumbar Lateral bending test: Positive due to fusion restriction. Patrick's Maneuver: Positive for bilateral S-I arthralgia              Gait & Posture Assessment  Ambulation: Limited Gait: Antalgic Posture: WNL   Lower Extremity Exam    Side: Right lower extremity  Side: Left lower extremity  Skin & Extremity Inspection: Skin color, temperature, and hair growth are WNL. No peripheral edema or cyanosis. No masses, redness, swelling, asymmetry, or associated skin lesions. No contractures.  Skin & Extremity Inspection: Skin color, temperature, and hair growth are WNL. No peripheral edema or cyanosis. No masses, redness, swelling, asymmetry, or associated skin lesions. No contractures.  Functional ROM: Unrestricted ROM          Functional ROM: Unrestricted ROM          Muscle Tone/Strength: Functionally intact. No obvious neuro-muscular anomalies detected.  Muscle Tone/Strength: Functionally intact. No obvious neuro-muscular anomalies detected.  Sensory (Neurological): Unimpaired  Sensory (Neurological): Unimpaired  Palpation: No palpable anomalies  Palpation: No palpable anomalies   Assessment  Primary Diagnosis & Pertinent Problem List: The primary encounter diagnosis was Lumbar spondylosis. 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  Visit Diagnosis (New problems to examiner): 1. Lumbar spondylosis   2. Lumbar degenerative disc disease   3. History of lumbar fusion   4. Age related osteoporosis, unspecified pathological fracture presence   5. Chronic pain syndrome   6. 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  Patient has a history of a transforaminal lumbar  interbody fusion from L2-L4 in 2007.  Patient also has moderate stenosis and degeneration at L1-L2 along with L5-S1.  This is to be expected given that these are the adjacent levels next to her fusion.  Patient does have severe limitation with lumbar extension and lateral rotation.  Patient also has pain overlying her PSIS and intermittent groin pain which could be secondary to SI joint arthralgia.  Patient does have positive Patrick's bilaterally.  We discussed performing facet blocks with steroid at L1, L5 along with bilateral SI joint injections for her low back and buttock/hip pain.  In regards to medication management, I will complete a urine drug screen.  Pending UDS results, I will take the patient on for medication management and have her complete an opioid contract with our clinic.  Patient was honest and informed me that her urine drug screen may be positive for marijuana as she is smoked earlier this week.  She does not use marijuana regularly.  She states that she has used it on occasion when her pain is very severe.  I have instructed her that she cannot utilize any additional THC while she is on opioid therapy.  If future urine drug screens show THC, this could be grounds to null her opioid contract with [place].  Patient endorses understanding.    Patient takes Percocet 5 mg 3 times daily as needed.  She states that 1 tablet is not effective.  I think it is reasonable to increase her dose to 7.5 mg 3 times daily as needed to assist with her arthritic and radicular symptoms.  Patient has been on Klonopin for over 20 years and this is managed by her psychiatrist.  They have explored alternatives for her anxiety management but this is the only medication that seems to have benefit.  She has tried weaning herself off this medication in the past which resulted in significant withdrawal symptoms.  Although I informed the patient that concomitant opioid and benzodiazepine intake can result in respiratory  depression, because she is seeing a psychiatrist and has been on this medication for such a long time, it is reasonable to continue.  I did inform her that her risk of respiratory depression and cardiovascular events is increased with concomitant opioid and benzodiazepine intake.  Plan: -Urine drug screen today -Pending UDS, will have patient complete opiate agreement and take patient on for management.  Okay to increase hydrocodone to 7.5 mg 3 times daily as needed since patient takes 1.5 tablets for symptomatic relief. -Patient instructed to continue gabapentin 600 mg 3 times a day, Voltaren gel as needed.  Okay to continue Celebrex 200 mg daily as needed.  Agree with not taking this medication regularly.  Okay to take for 5-7 days span when pain flare. -Scheduled for bilateral L1, L5 facet blocks with steroid, (above and below the level of her hardware) -Scheduled for bilateral SI joint injections for SI joint arthralgia  Ordered Lab-work, Procedure(s), Referral(s), & Consult(s): Orders Placed This Encounter  Procedures  . LUMBAR FACET(MEDIAL BRANCH NERVE BLOCK) MBNB  . SACROILIAC JOINT INJECTION  . Compliance Drug Analysis, Ur    Pharmacological management options:  Opioid Analgesics: The patient was informed that there is no guarantee that she would be a candidate for opioid analgesics. The decision will be made following CDC guidelines. This decision will be based on the results of diagnostic studies, as well as Ms. Walkup risk profile.   Membrane stabilizer: Currently on gabapentin 600 mg 3 times daily.  Can consider Lyrica, Cymbalta  Muscle relaxant: Can consider tizanidine, Flexeril.  NSAID: Currently utilizes Celebrex as needed.  Can consider diclofenac, Mobic.  Other analgesic(s): To be determined at a later time   Interventional management options: Ms. Dileo was informed that there is no guarantee that she would be a candidate for interventional therapies. The decision will be  based on the results of diagnostic studies, as well as Ms. [place] risk profile.  Procedure(s) under consideration:  -Bilateral L1, L5 facets with steroid  -bilateral SI joint injection   Provider-requested follow-up: Return in about 6 days (around 06/07/2017) for Procedure.  Future Appointments  Date Time Provider [place]  06/07/2017  9:30 AM Gillis Santa, MD [facility] None    Primary Care Physician: Frazier Richards, MD Location: [facility] Note by: Gillis Santa, M.D, Date: 06/01/2017; Time: 2:47 PM  Patient Instructions   1. Urine drug screen today 2. Schedule for L1, L5 facets and SI joint injectionPain Management Discharge Instructions  General Discharge Instructions :  If you need to reach your doctor  call: Monday-Friday 8:00 am - 4:00 pm at [phone] or toll free [phone].  After clinic hours [phone] to have operator reach doctor.  Bring all of your medication bottles to all your appointments in the pain clinic.  To cancel or reschedule your appointment with Pain Management please remember to call 24 hours in advance to avoid a fee.  Refer to the educational materials which you have been given on: General Risks, I had my Procedure. Discharge Instructions, Post Sedation.  Post Procedure Instructions:  The drugs you were given will stay in your system until tomorrow, so for the next 24 hours you should not drive, make any legal decisions or drink any alcoholic beverages.  You may eat anything you prefer, but it is better to start with liquids then soups and crackers, and gradually work up to solid foods.  Please notify your doctor immediately if you have any unusual bleeding, trouble breathing or pain that is not related to your normal pain.  Depending on the type of procedure that was done, some parts of your body may feel week and/or numb.  This usually clears up by tonight or the next day.  Walk with the use of an  assistive device or accompanied by an adult for the 24 hours.  You may use ice on the affected area for the first 24 hours.  Put ice in a Ziploc bag and cover with a towel and place against area 15 minutes on 15 minutes off.  You may switch to heat after 24 hours.GENERAL RISKS AND COMPLICATIONS  What are the risk, side effects and possible complications? Generally speaking, most procedures are safe.  However, with any procedure there are risks, side effects, and the possibility of complications.  The risks and complications are dependent upon the sites that are lesioned, or the type of nerve block to be performed.  The closer the procedure is to the spine, the more serious the risks are.  Great care is taken when placing the radio frequency needles, block needles or lesioning probes, but sometimes complications can occur. 1. Infection: Any time there is an injection through the skin, there is a risk of infection.  This is why sterile conditions are used for these blocks.  There are four possible types of infection. 1. Localized skin infection. 2. Central Nervous System Infection-This can be in the form of Meningitis, which can be deadly. 3. Epidural Infections-This can be in the form of an epidural abscess, which can cause pressure inside of the spine, causing compression of the spinal cord with subsequent paralysis. This would require an emergency surgery to decompress, and there are no guarantees that the patient would recover from the paralysis. 4. Discitis-This is an infection of the intervertebral discs.  It occurs in about 1% of discography procedures.  It is difficult to treat and it may lead to surgery.        2. Pain: the needles have to go through skin and soft tissues, will cause soreness.       3. Damage to internal structures:  The nerves to be lesioned may be near blood vessels or    other nerves which can be potentially damaged.       4. Bleeding: Bleeding is more common if the patient  is taking blood thinners such as  aspirin, Coumadin, Ticiid, Plavix, etc., or if he/she have some genetic predisposition  such as hemophilia. Bleeding into the spinal canal can cause compression of the spinal  cord with subsequent  paralysis.  This would require an emergency surgery to  decompress and there are no guarantees that the patient would recover from the  paralysis.       5. Pneumothorax:  Puncturing of a lung is a possibility, every time a needle is introduced in  the area of the chest or upper back.  Pneumothorax refers to free air around the  collapsed lung(s), inside of the thoracic cavity (chest cavity).  Another two possible  complications related to a similar event would include: Hemothorax and Chylothorax.   These are variations of the Pneumothorax, where instead of air around the collapsed  lung(s), you may have blood or chyle, respectively.       6. Spinal headaches: They may occur with any procedures in the area of the spine.       7. Persistent CSF (Cerebro-Spinal Fluid) leakage: This is a rare problem, but may occur  with prolonged intrathecal or epidural catheters either due to the formation of a fistulous  track or a dural tear.       8. Nerve damage: By working so close to the spinal cord, there is always a possibility of  nerve damage, which could be as serious as a permanent spinal cord injury with  paralysis.       9. Death:  Although rare, severe deadly allergic reactions known as "Anaphylactic  reaction" can occur to any of the medications used.      10. Worsening of the symptoms:  We can always make thing worse.  What are the chances of something like this happening? Chances of any of this occuring are extremely low.  By statistics, you have more of a chance of getting killed in a motor vehicle accident: while driving to the hospital than any of the above occurring .  Nevertheless, you should be aware that they are possibilities.  In general, it is similar to taking a shower.   Everybody knows that you can slip, hit your head and get killed.  Does that mean that you should not shower again?  Nevertheless always keep in mind that statistics do not mean anything if you happen to be on the wrong side of them.  Even if a procedure has a 1 (one) in a 1,000,000 (million) chance of going wrong, it you happen to be that one..Also, keep in mind that by statistics, you have more of a chance of having something go wrong when taking medications.  Who should not have this procedure? If you are on a blood thinning medication (e.g. Coumadin, Plavix, see list of "Blood Thinners"), or if you have an active infection going on, you should not have the procedure.  If you are taking any blood thinners, please inform your physician.  How should I prepare for this procedure?  Do not eat or drink anything at least six hours prior to the procedure.  Bring a driver with you .  It cannot be a taxi.  Come accompanied by an adult that can drive you back, and that is strong enough to help you if your legs get weak or numb from the local anesthetic.  Take all of your medicines the morning of the procedure with just enough water to swallow them.  If you have diabetes, make sure that you are scheduled to have your procedure done first thing in the morning, whenever possible.  If you have diabetes, take only half of your insulin dose and notify our nurse that you have done so as  soon as you arrive at the clinic.  If you are diabetic, but only take blood sugar pills (oral hypoglycemic), then do not take them on the morning of your procedure.  You may take them after you have had the procedure.  Do not take aspirin or any aspirin-containing medications, at least eleven (11) days prior to the procedure.  They may prolong bleeding.  Wear loose fitting clothing that may be easy to take off and that you would not mind if it got stained with Betadine or blood.  Do not wear any jewelry or perfume  Remove  any nail coloring.  It will interfere with some of our monitoring equipment.  NOTE: Remember that this is not meant to be interpreted as a complete list of all possible complications.  Unforeseen problems may occur.  BLOOD THINNERS The following drugs contain aspirin or other products, which can cause increased bleeding during surgery and should not be taken for 2 weeks prior to and 1 week after surgery.  If you should need take something for relief of minor pain, you may take acetaminophen which is found in Tylenol,m Datril, Anacin-3 and Panadol. It is not blood thinner. The products listed below are.  Do not take any of the products listed below in addition to any listed on your instruction sheet.  A.P.C or A.P.C with Codeine Codeine Phosphate Capsules #3 Ibuprofen Ridaura  ABC compound Congesprin Imuran rimadil  Advil Cope Indocin Robaxisal  Alka-Seltzer Effervescent Pain Reliever and Antacid Coricidin or Coricidin-D  Indomethacin Rufen  Alka-Seltzer plus Cold Medicine Cosprin Ketoprofen S-A-C Tablets  Anacin Analgesic Tablets or Capsules Coumadin Korlgesic Salflex  Anacin Extra Strength Analgesic tablets or capsules CP-2 Tablets Lanoril Salicylate  Anaprox Cuprimine Capsules Levenox Salocol  Anexsia-D Dalteparin Magan Salsalate  Anodynos Darvon compound Magnesium Salicylate Sine-off  Ansaid Dasin Capsules Magsal Sodium Salicylate  Anturane Depen Capsules Marnal Soma  APF Arthritis pain formula Dewitt's Pills Measurin Stanback  Argesic Dia-Gesic Meclofenamic Sulfinpyrazone  Arthritis Bayer Timed Release Aspirin Diclofenac Meclomen Sulindac  Arthritis pain formula Anacin Dicumarol Medipren Supac  Analgesic (Safety coated) Arthralgen Diffunasal Mefanamic Suprofen  Arthritis Strength Bufferin Dihydrocodeine Mepro Compound Suprol  Arthropan liquid Dopirydamole Methcarbomol with Aspirin Synalgos  ASA tablets/Enseals Disalcid Micrainin Tagament  Ascriptin Doan's Midol Talwin  Ascriptin A/D  Dolene Mobidin Tanderil  Ascriptin Extra Strength Dolobid Moblgesic Ticlid  Ascriptin with Codeine Doloprin or Doloprin with Codeine Momentum Tolectin  Asperbuf Duoprin Mono-gesic Trendar  Aspergum Duradyne Motrin or Motrin IB Triminicin  Aspirin plain, buffered or enteric coated Durasal Myochrisine Trigesic  Aspirin Suppositories Easprin Nalfon Trillsate  Aspirin with Codeine Ecotrin Regular or Extra Strength Naprosyn Uracel  Atromid-S Efficin Naproxen Ursinus  Auranofin Capsules Elmiron Neocylate Vanquish  Axotal Emagrin Norgesic Verin  Azathioprine Empirin or Empirin with Codeine Normiflo Vitamin E  Azolid Emprazil Nuprin Voltaren  Bayer Aspirin plain, buffered or children's or timed BC Tablets or powders Encaprin Orgaran Warfarin Sodium  Buff-a-Comp Enoxaparin Orudis Zorpin  Buff-a-Comp with Codeine Equegesic Os-Cal-Gesic   Buffaprin Excedrin plain, buffered or Extra Strength Oxalid   Bufferin Arthritis Strength Feldene Oxphenbutazone   Bufferin plain or Extra Strength Feldene Capsules Oxycodone with Aspirin   Bufferin with Codeine Fenoprofen Fenoprofen Pabalate or Pabalate-SF   Buffets II Flogesic Panagesic   Buffinol plain or Extra Strength Florinal or Florinal with Codeine Panwarfarin   Buf-Tabs Flurbiprofen Penicillamine   Butalbital Compound Four-way cold tablets Penicillin   Butazolidin Fragmin Pepto-Bismol   Carbenicillin Geminisyn Percodan   Carna Arthritis Reliever [place]  Persantine   Carprofen Gold's salt Persistin   Chloramphenicol Goody's Phenylbutazone   Chloromycetin Haltrain Piroxlcam   Clmetidine heparin Plaquenil   Cllnoril Hyco-pap Ponstel   Clofibrate Hydroxy chloroquine Propoxyphen         Before stopping any of these medications, be sure to consult the physician who ordered them.  Some, such as Coumadin (Warfarin) are ordered to prevent or treat serious conditions such as "deep thrombosis", "pumonary embolisms", and other heart problems.  The amount of time  that you may need off of the medication may also vary with the medication and the reason for which you were taking it.  If you are taking any of these medications, please make sure you notify your pain physician before you undergo any procedures.         Sacroiliac (SI) Joint Injection Patient Information  Description: The sacroiliac joint connects the scrum (very low back and tailbone) to the ilium (a pelvic bone which also forms half of the hip joint).  Normally this joint experiences very little motion.  When this joint becomes inflamed or unstable low back and or hip and pelvis pain may result.  Injection of this joint with local anesthetics (numbing medicines) and steroids can provide diagnostic information and reduce pain.  This injection is performed with the aid of x-ray guidance into the tailbone area while you are lying on your stomach.   You may experience an electrical sensation down the leg while this is being done.  You may also experience numbness.  We also may ask if we are reproducing your normal pain during the injection.  Conditions which may be treated SI injection:   Low back, buttock, hip or leg pain  Preparation for the Injection:  1. Do not eat any solid food or dairy products within 8 hours of your appointment.  2. You may drink clear liquids up to 3 hours before appointment.  Clear liquids include water, black coffee, juice or soda.  No milk or cream please. 3. You may take your regular medications, including pain medications with a sip of water before your appointment.  Diabetics should hold regular insulin (if take separately) and take 1/2 normal NPH dose the morning of the procedure.  Carry some sugar containing items with you to your appointment. 4. A driver must accompany you and be prepared to drive you home after your procedure. 5. Bring all of your current medications with you. 6. An IV may be inserted and sedation may be given at the discretion of the  physician. 7. A blood pressure cuff, EKG and other monitors will often be applied during the procedure.  Some patients may need to have extra oxygen administered for a short period.  8. You will be asked to provide medical information, including your allergies, prior to the procedure.  We must know immediately if you are taking blood thinners (like Coumadin/Warfarin) or if you are allergic to IV iodine contrast (dye).  We must know if you could possible be pregnant.  Possible side effects:   Bleeding from needle site  Infection (rare, may require surgery)  Nerve injury (rare)  Numbness & tingling (temporary)  A brief convulsion or seizure  Light-headedness (temporary)  Pain at injection site (several days)  Decreased blood pressure (temporary)  Weakness in the leg (temporary)   Call if you experience:   New onset weakness or numbness of an extremity below the injection site that last more than 8 hours.  Hives or difficulty breathing (  go to the emergency room)  Inflammation or drainage at the injection site  Any new symptoms which are concerning to you  Please note:  Although the local anesthetic injected can often make your back/ hip/ buttock/ leg feel good for several hours after the injections, the pain will likely return.  It takes 3-7 days for steroids to work in the sacroiliac area.  You may not notice any pain relief for at least that one week.  If effective, we will often do a series of three injections spaced 3-6 weeks apart to maximally decrease your pain.  After the initial series, we generally will wait some months before a repeat injection of the same type.  If you have any questions, please call [phone] [facility]   Facet Joint Block The facet joints connect the bones of the spine (vertebrae). They make it possible for you to bend, twist, and make other movements with your spine. They also keep you from bending too  far, twisting too far, and making other excessive movements. A facet joint block is a procedure where a numbing medicine (anesthetic) is injected into a facet joint. Often, a type of anti-inflammatory medicine called a steroid is also injected. A facet joint block may be done to diagnose neck or back pain. If the pain gets better after a facet joint block, it means the pain is probably coming from the facet joint. If the pain does not get better, it means the pain is probably not coming from the facet joint. A facet joint block may also be done to relieve neck or back pain caused by an inflamed facet joint. A facet joint block is only done to relieve pain if the pain does not improve with other methods, such as medicine, exercise programs, and physical therapy. Tell a health care provider about:  Any allergies you have.  All medicines you are taking, including vitamins, herbs, eye drops, creams, and over-the-counter medicines.  Any problems you or family members have had with anesthetic medicines.  Any blood disorders you have.  Any surgeries you have had.  Any medical conditions you have.  Whether you are pregnant or may be pregnant. What are the risks? Generally, this is a safe procedure. However, problems may occur, including:  Bleeding.  Injury to a nerve near the injection site.  Pain at the injection site.  Weakness or numbness in areas controlled by nerves near the injection site.  Infection.  Temporary fluid retention.  Allergic reactions to medicines or dyes.  Injury to other structures or organs near the injection site.  What happens before the procedure?  Follow instructions from your health care provider about eating or drinking restrictions.  Ask your health care provider about: ? Changing or stopping your regular medicines. This is especially important if you are taking diabetes medicines or blood thinners. ? Taking medicines such as aspirin and ibuprofen.  These medicines can thin your blood. Do not take these medicines before your procedure if your health care provider instructs you not to.  Do not take any new dietary supplements or medicines without asking your health care provider first.  Plan to have someone take you home after the procedure. What happens during the procedure?  You may need to remove your clothing and dress in an open-back gown.  The procedure will be done while you are lying on an X-ray table. You will most likely be asked to lie on your stomach, but you may  be asked to lie in a different position if an injection will be made in your neck.  Machines will be used to monitor your oxygen levels, heart rate, and blood pressure.  If an injection will be made in your neck, an IV tube will be inserted into one of your veins. Fluids and medicine will flow directly into your body through the IV tube.  The area over the facet joint where the injection will be made will be cleaned with soap. The surrounding skin will be covered with clean drapes.  A numbing medicine (local anesthetic) will be applied to your skin. Your skin may sting or burn for a moment.  A video X-ray machine (fluoroscopy) will be used to locate the joint. In some cases, a CT scan may be used.  A contrast dye may be injected into the facet joint area to help locate the joint.  When the joint is located, an anesthetic will be injected into the joint through the needle.  Your health care provider will ask you whether you feel pain relief. If you do feel relief, a steroid may be injected to provide pain relief for a longer period of time. If you do not feel relief or feel only partial relief, additional injections of an anesthetic may be made in other facet joints.  The needle will be removed.  Your skin will be cleaned.  A bandage (dressing) will be applied over each injection site. The procedure may vary among health care providers and hospitals. What  happens after the procedure?  You will be observed for 15-30 minutes before being allowed to go home. This information is not intended to replace advice given to you by your health care provider. Make sure you discuss any questions you have with your health care provider. Document Released: 11/02/2006 Document Revised: 07/15/2015 Document Reviewed: 03/09/2015 [employer] Interactive Patient Education  [employer].

## 2017-06-01 NOTE — Patient Instructions (Addendum)
1. Urine drug screen today 2. Schedule for L1, L5 facets and SI joint injectionPain Management Discharge Instructions  General Discharge Instructions :  If you need to reach your doctor call: Monday-Friday 8:00 am - 4:00 pm at [phone] or toll free [phone].  After clinic hours [phone] to have operator reach doctor.  Bring all of your medication bottles to all your appointments in the pain clinic.  To cancel or reschedule your appointment with Pain Management please remember to call 24 hours in advance to avoid a fee.  Refer to the educational materials which you have been given on: General Risks, I had my Procedure. Discharge Instructions, Post Sedation.  Post Procedure Instructions:  The drugs you were given will stay in your system until tomorrow, so for the next 24 hours you should not drive, make any legal decisions or drink any alcoholic beverages.  You may eat anything you prefer, but it is better to start with liquids then soups and crackers, and gradually work up to solid foods.  Please notify your doctor immediately if you have any unusual bleeding, trouble breathing or pain that is not related to your normal pain.  Depending on the type of procedure that was done, some parts of your body may feel week and/or numb.  This usually clears up by tonight or the next day.  Walk with the use of an assistive device or accompanied by an adult for the 24 hours.  You may use ice on the affected area for the first 24 hours.  Put ice in a Ziploc bag and cover with a towel and place against area 15 minutes on 15 minutes off.  You may switch to heat after 24 hours.GENERAL RISKS AND COMPLICATIONS  What are the risk, side effects and possible complications? Generally speaking, most procedures are safe.  However, with any procedure there are risks, side effects, and the possibility of complications.  The risks and complications are dependent upon the sites that are lesioned, or the  type of nerve block to be performed.  The closer the procedure is to the spine, the more serious the risks are.  Great care is taken when placing the radio frequency needles, block needles or lesioning probes, but sometimes complications can occur. 1. Infection: Any time there is an injection through the skin, there is a risk of infection.  This is why sterile conditions are used for these blocks.  There are four possible types of infection. 1. Localized skin infection. 2. Central Nervous System Infection-This can be in the form of Meningitis, which can be deadly. 3. Epidural Infections-This can be in the form of an epidural abscess, which can cause pressure inside of the spine, causing compression of the spinal cord with subsequent paralysis. This would require an emergency surgery to decompress, and there are no guarantees that the patient would recover from the paralysis. 4. Discitis-This is an infection of the intervertebral discs.  It occurs in about 1% of discography procedures.  It is difficult to treat and it may lead to surgery.        2. Pain: the needles have to go through skin and soft tissues, will cause soreness.       3. Damage to internal structures:  The nerves to be lesioned may be near blood vessels or    other nerves which can be potentially damaged.       4. Bleeding: Bleeding is more common if the patient is taking blood thinners such as  aspirin,  Coumadin, Ticiid, Plavix, etc., or if he/she have some genetic predisposition  such as hemophilia. Bleeding into the spinal canal can cause compression of the spinal  cord with subsequent paralysis.  This would require an emergency surgery to  decompress and there are no guarantees that the patient would recover from the  paralysis.       5. Pneumothorax:  Puncturing of a lung is a possibility, every time a needle is introduced in  the area of the chest or upper back.  Pneumothorax refers to free air around the  collapsed lung(s), inside of  the thoracic cavity (chest cavity).  Another two possible  complications related to a similar event would include: Hemothorax and Chylothorax.   These are variations of the Pneumothorax, where instead of air around the collapsed  lung(s), you may have blood or chyle, respectively.       6. Spinal headaches: They may occur with any procedures in the area of the spine.       7. Persistent CSF (Cerebro-Spinal Fluid) leakage: This is a rare problem, but may occur  with prolonged intrathecal or epidural catheters either due to the formation of a fistulous  track or a dural tear.       8. Nerve damage: By working so close to the spinal cord, there is always a possibility of  nerve damage, which could be as serious as a permanent spinal cord injury with  paralysis.       9. Death:  Although rare, severe deadly allergic reactions known as "Anaphylactic  reaction" can occur to any of the medications used.      10. Worsening of the symptoms:  We can always make thing worse.  What are the chances of something like this happening? Chances of any of this occuring are extremely low.  By statistics, you have more of a chance of getting killed in a motor vehicle accident: while driving to the hospital than any of the above occurring .  Nevertheless, you should be aware that they are possibilities.  In general, it is similar to taking a shower.  Everybody knows that you can slip, hit your head and get killed.  Does that mean that you should not shower again?  Nevertheless always keep in mind that statistics do not mean anything if you happen to be on the wrong side of them.  Even if a procedure has a 1 (one) in a 1,000,000 (million) chance of going wrong, it you happen to be that one..Also, keep in mind that by statistics, you have more of a chance of having something go wrong when taking medications.  Who should not have this procedure? If you are on a blood thinning medication (e.g. Coumadin, Plavix, see list of "Blood  Thinners"), or if you have an active infection going on, you should not have the procedure.  If you are taking any blood thinners, please inform your physician.  How should I prepare for this procedure?  Do not eat or drink anything at least six hours prior to the procedure.  Bring a driver with you .  It cannot be a taxi.  Come accompanied by an adult that can drive you back, and that is strong enough to help you if your legs get weak or numb from the local anesthetic.  Take all of your medicines the morning of the procedure with just enough water to swallow them.  If you have diabetes, make sure that you are scheduled to have your  procedure done first thing in the morning, whenever possible.  If you have diabetes, take only half of your insulin dose and notify our nurse that you have done so as soon as you arrive at the clinic.  If you are diabetic, but only take blood sugar pills (oral hypoglycemic), then do not take them on the morning of your procedure.  You may take them after you have had the procedure.  Do not take aspirin or any aspirin-containing medications, at least eleven (11) days prior to the procedure.  They may prolong bleeding.  Wear loose fitting clothing that may be easy to take off and that you would not mind if it got stained with Betadine or blood.  Do not wear any jewelry or perfume  Remove any nail coloring.  It will interfere with some of our monitoring equipment.  NOTE: Remember that this is not meant to be interpreted as a complete list of all possible complications.  Unforeseen problems may occur.  BLOOD THINNERS The following drugs contain aspirin or other products, which can cause increased bleeding during surgery and should not be taken for 2 weeks prior to and 1 week after surgery.  If you should need take something for relief of minor pain, you may take acetaminophen which is found in Tylenol,m Datril, Anacin-3 and Panadol. It is not blood thinner. The  products listed below are.  Do not take any of the products listed below in addition to any listed on your instruction sheet.  A.P.C or A.P.C with Codeine Codeine Phosphate Capsules #3 Ibuprofen Ridaura  ABC compound Congesprin Imuran rimadil  Advil Cope Indocin Robaxisal  Alka-Seltzer Effervescent Pain Reliever and Antacid Coricidin or Coricidin-D  Indomethacin Rufen  Alka-Seltzer plus Cold Medicine Cosprin Ketoprofen S-A-C Tablets  Anacin Analgesic Tablets or Capsules Coumadin Korlgesic Salflex  Anacin Extra Strength Analgesic tablets or capsules CP-2 Tablets Lanoril Salicylate  Anaprox Cuprimine Capsules Levenox Salocol  Anexsia-D Dalteparin Magan Salsalate  Anodynos Darvon compound Magnesium Salicylate Sine-off  Ansaid Dasin Capsules Magsal Sodium Salicylate  Anturane Depen Capsules Marnal Soma  APF Arthritis pain formula Dewitt's Pills Measurin Stanback  Argesic Dia-Gesic Meclofenamic Sulfinpyrazone  Arthritis Bayer Timed Release Aspirin Diclofenac Meclomen Sulindac  Arthritis pain formula Anacin Dicumarol Medipren Supac  Analgesic (Safety coated) Arthralgen Diffunasal Mefanamic Suprofen  Arthritis Strength Bufferin Dihydrocodeine Mepro Compound Suprol  Arthropan liquid Dopirydamole Methcarbomol with Aspirin Synalgos  ASA tablets/Enseals Disalcid Micrainin Tagament  Ascriptin Doan's Midol Talwin  Ascriptin A/D Dolene Mobidin Tanderil  Ascriptin Extra Strength Dolobid Moblgesic Ticlid  Ascriptin with Codeine Doloprin or Doloprin with Codeine Momentum Tolectin  Asperbuf Duoprin Mono-gesic Trendar  Aspergum Duradyne Motrin or Motrin IB Triminicin  Aspirin plain, buffered or enteric coated Durasal Myochrisine Trigesic  Aspirin Suppositories Easprin Nalfon Trillsate  Aspirin with Codeine Ecotrin Regular or Extra Strength Naprosyn Uracel  Atromid-S Efficin Naproxen Ursinus  Auranofin Capsules Elmiron Neocylate Vanquish  Axotal Emagrin Norgesic Verin  Azathioprine Empirin or Empirin  with Codeine Normiflo Vitamin E  Azolid Emprazil Nuprin Voltaren  Bayer Aspirin plain, buffered or children's or timed BC Tablets or powders Encaprin Orgaran Warfarin Sodium  Buff-a-Comp Enoxaparin Orudis Zorpin  Buff-a-Comp with Codeine Equegesic Os-Cal-Gesic   Buffaprin Excedrin plain, buffered or Extra Strength Oxalid   Bufferin Arthritis Strength Feldene Oxphenbutazone   Bufferin plain or Extra Strength Feldene Capsules Oxycodone with Aspirin   Bufferin with Codeine Fenoprofen Fenoprofen Pabalate or Pabalate-SF   Buffets II Flogesic Panagesic   Buffinol plain or Extra Strength Florinal or Florinal with  Codeine Panwarfarin   Buf-Tabs Flurbiprofen Penicillamine   Butalbital Compound Four-way cold tablets Penicillin   Butazolidin Fragmin Pepto-Bismol   Carbenicillin Geminisyn Percodan   Carna Arthritis Reliever Geopen Persantine   Carprofen Gold's salt Persistin   Chloramphenicol Goody's Phenylbutazone   Chloromycetin Haltrain Piroxlcam   Clmetidine heparin Plaquenil   Cllnoril Hyco-pap Ponstel   Clofibrate Hydroxy chloroquine Propoxyphen         Before stopping any of these medications, be sure to consult the physician who ordered them.  Some, such as Coumadin (Warfarin) are ordered to prevent or treat serious conditions such as "deep thrombosis", "pumonary embolisms", and other heart problems.  The amount of time that you may need off of the medication may also vary with the medication and the reason for which you were taking it.  If you are taking any of these medications, please make sure you notify your pain physician before you undergo any procedures.         Sacroiliac (SI) Joint Injection Patient Information  Description: The sacroiliac joint connects the scrum (very low back and tailbone) to the ilium (a pelvic bone which also forms half of the hip joint).  Normally this joint experiences very little motion.  When this joint becomes inflamed or unstable low back and or  hip and pelvis pain may result.  Injection of this joint with local anesthetics (numbing medicines) and steroids can provide diagnostic information and reduce pain.  This injection is performed with the aid of x-ray guidance into the tailbone area while you are lying on your stomach.   You may experience an electrical sensation down the leg while this is being done.  You may also experience numbness.  We also may ask if we are reproducing your normal pain during the injection.  Conditions which may be treated SI injection:   Low back, buttock, hip or leg pain  Preparation for the Injection:  1. Do not eat any solid food or dairy products within 8 hours of your appointment.  2. You may drink clear liquids up to 3 hours before appointment.  Clear liquids include water, black coffee, juice or soda.  No milk or cream please. 3. You may take your regular medications, including pain medications with a sip of water before your appointment.  Diabetics should hold regular insulin (if take separately) and take 1/2 normal NPH dose the morning of the procedure.  Carry some sugar containing items with you to your appointment. 4. A driver must accompany you and be prepared to drive you home after your procedure. 5. Bring all of your current medications with you. 6. An IV may be inserted and sedation may be given at the discretion of the physician. 7. A blood pressure cuff, EKG and other monitors will often be applied during the procedure.  Some patients may need to have extra oxygen administered for a short period.  8. You will be asked to provide medical information, including your allergies, prior to the procedure.  We must know immediately if you are taking blood thinners (like Coumadin/Warfarin) or if you are allergic to IV iodine contrast (dye).  We must know if you could possible be pregnant.  Possible side effects:   Bleeding from needle site  Infection (rare, may require surgery)  Nerve injury  (rare)  Numbness & tingling (temporary)  A brief convulsion or seizure  Light-headedness (temporary)  Pain at injection site (several days)  Decreased blood pressure (temporary)  Weakness in the leg (temporary)  Call if you experience:   New onset weakness or numbness of an extremity below the injection site that last more than 8 hours.  Hives or difficulty breathing ( go to the emergency room)  Inflammation or drainage at the injection site  Any new symptoms which are concerning to you  Please note:  Although the local anesthetic injected can often make your back/ hip/ buttock/ leg feel good for several hours after the injections, the pain will likely return.  It takes 3-7 days for steroids to work in the sacroiliac area.  You may not notice any pain relief for at least that one week.  If effective, we will often do a series of three injections spaced 3-6 weeks apart to maximally decrease your pain.  After the initial series, we generally will wait some months before a repeat injection of the same type.  If you have any questions, please call [phone] [facility]   Facet Joint Block The facet joints connect the bones of the spine (vertebrae). They make it possible for you to bend, twist, and make other movements with your spine. They also keep you from bending too far, twisting too far, and making other excessive movements. A facet joint block is a procedure where a numbing medicine (anesthetic) is injected into a facet joint. Often, a type of anti-inflammatory medicine called a steroid is also injected. A facet joint block may be done to diagnose neck or back pain. If the pain gets better after a facet joint block, it means the pain is probably coming from the facet joint. If the pain does not get better, it means the pain is probably not coming from the facet joint. A facet joint block may also be done to relieve neck or back pain  caused by an inflamed facet joint. A facet joint block is only done to relieve pain if the pain does not improve with other methods, such as medicine, exercise programs, and physical therapy. Tell a health care provider about:  Any allergies you have.  All medicines you are taking, including vitamins, herbs, eye drops, creams, and over-the-counter medicines.  Any problems you or family members have had with anesthetic medicines.  Any blood disorders you have.  Any surgeries you have had.  Any medical conditions you have.  Whether you are pregnant or may be pregnant. What are the risks? Generally, this is a safe procedure. However, problems may occur, including:  Bleeding.  Injury to a nerve near the injection site.  Pain at the injection site.  Weakness or numbness in areas controlled by nerves near the injection site.  Infection.  Temporary fluid retention.  Allergic reactions to medicines or dyes.  Injury to other structures or organs near the injection site.  What happens before the procedure?  Follow instructions from your health care provider about eating or drinking restrictions.  Ask your health care provider about: ? Changing or stopping your regular medicines. This is especially important if you are taking diabetes medicines or blood thinners. ? Taking medicines such as aspirin and ibuprofen. These medicines can thin your blood. Do not take these medicines before your procedure if your health care provider instructs you not to.  Do not take any new dietary supplements or medicines without asking your health care provider first.  Plan to have someone take you home after the procedure. What happens during the procedure?  You may need to remove your clothing and dress in an open-back  gown.  The procedure will be done while you are lying on an X-ray table. You will most likely be asked to lie on your stomach, but you may be asked to lie in a different position  if an injection will be made in your neck.  Machines will be used to monitor your oxygen levels, heart rate, and blood pressure.  If an injection will be made in your neck, an IV tube will be inserted into one of your veins. Fluids and medicine will flow directly into your body through the IV tube.  The area over the facet joint where the injection will be made will be cleaned with soap. The surrounding skin will be covered with clean drapes.  A numbing medicine (local anesthetic) will be applied to your skin. Your skin may sting or burn for a moment.  A video X-ray machine (fluoroscopy) will be used to locate the joint. In some cases, a CT scan may be used.  A contrast dye may be injected into the facet joint area to help locate the joint.  When the joint is located, an anesthetic will be injected into the joint through the needle.  Your health care provider will ask you whether you feel pain relief. If you do feel relief, a steroid may be injected to provide pain relief for a longer period of time. If you do not feel relief or feel only partial relief, additional injections of an anesthetic may be made in other facet joints.  The needle will be removed.  Your skin will be cleaned.  A bandage (dressing) will be applied over each injection site. The procedure may vary among health care providers and hospitals. What happens after the procedure?  You will be observed for 15-30 minutes before being allowed to go home. This information is not intended to replace advice given to you by your health care provider. Make sure you discuss any questions you have with your health care provider. Document Released: 11/02/2006 Document Revised: 07/15/2015 Document Reviewed: 03/09/2015 Elsevier Interactive Patient Education  [employer].

## 2017-06-01 NOTE — Progress Notes (Signed)
Safety precautions to be maintained throughout the outpatient stay will include: orient to surroundings, keep bed in low position, maintain call bell within reach at all times, provide assistance with transfer out of bed and ambulation.  

## 2017-06-07 ENCOUNTER — Ambulatory Visit: Payer: Medicare HMO | Admitting: Student in an Organized Health Care Education/Training Program

## 2017-06-07 LAB — COMPLIANCE DRUG ANALYSIS, UR

## 2017-06-21 ENCOUNTER — Ambulatory Visit (HOSPITAL_BASED_OUTPATIENT_CLINIC_OR_DEPARTMENT_OTHER): Payer: Medicare HMO | Admitting: Student in an Organized Health Care Education/Training Program

## 2017-06-21 ENCOUNTER — Ambulatory Visit
Admission: RE | Admit: 2017-06-21 | Discharge: 2017-06-21 | Disposition: A | Discharge status: Home / Self Care | Payer: Medicare HMO | Source: Ambulatory Visit | Attending: Student in an Organized Health Care Education/Training Program | Admitting: Student in an Organized Health Care Education/Training Program

## 2017-06-21 ENCOUNTER — Encounter: Payer: Self-pay | Admitting: Student in an Organized Health Care Education/Training Program

## 2017-06-21 ENCOUNTER — Other Ambulatory Visit: Payer: Self-pay

## 2017-06-21 VITALS — BP 173/100 | HR 55 | Temp 97.5°F | Resp 17 | Ht 63.0 in | Wt 223.0 lb

## 2017-06-21 DIAGNOSIS — M461 Sacroiliitis, not elsewhere classified: Secondary | ICD-10-CM

## 2017-06-21 DIAGNOSIS — Z9861 Coronary angioplasty status: Secondary | ICD-10-CM | POA: Insufficient documentation

## 2017-06-21 DIAGNOSIS — M25552 Pain in left hip: Secondary | ICD-10-CM | POA: Insufficient documentation

## 2017-06-21 DIAGNOSIS — Z79890 Hormone replacement therapy (postmenopausal): Secondary | ICD-10-CM | POA: Insufficient documentation

## 2017-06-21 DIAGNOSIS — Z79891 Long term (current) use of opiate analgesic: Secondary | ICD-10-CM | POA: Insufficient documentation

## 2017-06-21 DIAGNOSIS — Z885 Allergy status to narcotic agent status: Secondary | ICD-10-CM | POA: Diagnosis not present

## 2017-06-21 DIAGNOSIS — Z90710 Acquired absence of both cervix and uterus: Secondary | ICD-10-CM | POA: Diagnosis not present

## 2017-06-21 DIAGNOSIS — M47816 Spondylosis without myelopathy or radiculopathy, lumbar region: Secondary | ICD-10-CM

## 2017-06-21 DIAGNOSIS — Z79899 Other long term (current) drug therapy: Secondary | ICD-10-CM | POA: Diagnosis not present

## 2017-06-21 DIAGNOSIS — E739 Lactose intolerance, unspecified: Secondary | ICD-10-CM | POA: Insufficient documentation

## 2017-06-21 DIAGNOSIS — Z881 Allergy status to other antibiotic agents status: Secondary | ICD-10-CM | POA: Insufficient documentation

## 2017-06-21 DIAGNOSIS — Z7951 Long term (current) use of inhaled steroids: Secondary | ICD-10-CM | POA: Insufficient documentation

## 2017-06-21 DIAGNOSIS — M47818 Spondylosis without myelopathy or radiculopathy, sacral and sacrococcygeal region: Secondary | ICD-10-CM | POA: Diagnosis not present

## 2017-06-21 DIAGNOSIS — F419 Anxiety disorder, unspecified: Secondary | ICD-10-CM | POA: Diagnosis not present

## 2017-06-21 DIAGNOSIS — Z9049 Acquired absence of other specified parts of digestive tract: Secondary | ICD-10-CM | POA: Insufficient documentation

## 2017-06-21 DIAGNOSIS — Z91013 Allergy to seafood: Secondary | ICD-10-CM | POA: Insufficient documentation

## 2017-06-21 DIAGNOSIS — M4698 Unspecified inflammatory spondylopathy, sacral and sacrococcygeal region: Secondary | ICD-10-CM

## 2017-06-21 DIAGNOSIS — Z91040 Latex allergy status: Secondary | ICD-10-CM | POA: Diagnosis not present

## 2017-06-21 MED ORDER — DEXAMETHASONE SODIUM PHOSPHATE 10 MG/ML IJ SOLN
10.0000 mg | Freq: Once | INTRAMUSCULAR | Status: AC
  Administered 2017-06-21: 10 mg
  Filled 2017-06-21: qty 1

## 2017-06-21 MED ORDER — FENTANYL CITRATE (PF) 100 MCG/2ML IJ SOLN: 25.0000 ug | INTRAMUSCULAR | Status: DC | PRN

## 2017-06-21 MED ORDER — ROPIVACAINE HCL 2 MG/ML IJ SOLN
10.0000 mL | Freq: Once | INTRAMUSCULAR | Status: AC
  Administered 2017-06-21: 10 mL
  Filled 2017-06-21: qty 10

## 2017-06-21 MED ORDER — LIDOCAINE HCL 1 % IJ SOLN
10.0000 mL | Freq: Once | INTRAMUSCULAR | Status: AC
  Administered 2017-06-21: 5 mL
  Filled 2017-06-21: qty 10

## 2017-06-21 MED ORDER — ROPIVACAINE HCL 2 MG/ML IJ SOLN
INTRAMUSCULAR | Status: AC
  Filled 2017-06-21: qty 10

## 2017-06-21 MED ORDER — DEXAMETHASONE SODIUM PHOSPHATE 10 MG/ML IJ SOLN
INTRAMUSCULAR | Status: AC
  Filled 2017-06-21: qty 1

## 2017-06-21 NOTE — Progress Notes (Signed)
[name] Name: Stacy Allen  MRN: [id number]  Referring Provider: Gillis Santa, MD  DOB: 1940-09-16  PCP: Frazier Richards, MD  DOS: 06/21/2017  Note by: Gillis Santa, MD  Service setting: Ambulatory outpatient  Specialty: Interventional Pain Management  Patient type: Established  Location: [facility] (AMB) Pain Management Facility  Visit type: Interventional Procedure   Primary Reason for Visit: Interventional Pain Management Treatment. CC: Hip Pain (left) And back pain Procedure:  Anesthesia, Analgesia, Anxiolysis:  Type: Diagnostic Medial Branch Facet Block Region: Lumbar Level: bilateral L1, L5 facet blocks with steroid Laterality: Bilateral  Type: Local Anesthesia Local Anesthetic: Lidocaine 1% Route: Infiltration ([place]/IM) IV Access: Declined Sedation: Meaningful verbal contact was maintained at all times during the procedure  Indication(s): Analgesia and Anxiety   Indications: Lumbar spondylosis  Pain Score: Pre-procedure: 9 /10 Post-procedure: 9 /10  Pre-op Assessment:  Stacy Allen is a 76 y.o. (year old), female patient, seen today for interventional treatment. She  has a past surgical history that includes Abdominal hysterectomy; Cardiac catheterization (N/A, 04/03/2015); Back surgery (2007); Cholecystectomy; Fracture surgery (Left, 1991); Coronary angioplasty (2015); Image guided sinus surgery (Bilateral, 09/28/2015); and Esophagogastroduodenoscopy (egd) with propofol (N/A, 12/21/2016). Stacy Allen has a current medication list which includes the following prescription(s): acetaminophen, albuterol, amlodipine, celecoxib, cetirizine, citalopram, clonazepam, diclofenac sodium, enalapril, fluticasone, gabapentin, hydrocodone-acetaminophen, levothyroxine, loratadine, meclizine, metoprolol tartrate, nitroglycerin, omeprazole, ranitidine, ciprofloxacin, ergocalciferol, methylprednisolone, phenazopyridine, pravastatin, prednisone, risperidone, and sulfamethoxazole-trimethoprim, and the following  Facility-Administered Medications: fentanyl. Her primarily concern today is the Hip Pain (left)  Initial Vital Signs: There were no vitals taken for this visit. BMI: Estimated body mass index is 39.5 kg/m as calculated from the following:   Height as of this encounter: 5\' 3"  (1.6 m).   Weight as of this encounter: 223 lb (101.2 kg).  Risk Assessment: Allergies: Reviewed. She is allergic to zithromax [azithromycin]; adhesive [tape]; codeine; lactose intolerance (gi); lodine [etodolac]; morphine and related; red dye; shellfish-derived products; and latex.  Allergy Precautions: None required Coagulopathies: Reviewed. None identified.  Blood-thinner therapy: None at this time Active Infection(s): Reviewed. None identified. Stacy Allen is afebrile  Site Confirmation: Stacy Allen was asked to confirm the procedure and laterality before marking the site Procedure checklist: Completed Consent: Before the procedure and under the influence of no sedative(s), amnesic(s), or anxiolytics, the patient was informed of the treatment options, risks and possible complications. To fulfill our ethical and legal obligations, as recommended by the American Medical Association's Code of Ethics, I have informed the patient of my clinical impression; the nature and purpose of the treatment or procedure; the risks, benefits, and possible complications of the intervention; the alternatives, including doing nothing; the risk(s) and benefit(s) of the alternative treatment(s) or procedure(s); and the risk(s) and benefit(s) of doing nothing. The patient was provided information about the general risks and possible complications associated with the procedure. These may include, but are not limited to: failure to achieve desired goals, infection, bleeding, organ or nerve damage, allergic reactions, paralysis, and death. In addition, the patient was informed of those risks and complications associated to Spine-related procedures,  such as failure to decrease pain; infection (i.e.: Meningitis, epidural or intraspinal abscess); bleeding (i.e.: epidural hematoma, subarachnoid hemorrhage, or any other type of intraspinal or peri-dural bleeding); organ or nerve damage (i.e.: Any type of peripheral nerve, nerve root, or spinal cord injury) with subsequent damage to sensory, motor, and/or autonomic systems, resulting in permanent pain, numbness, and/or weakness of one or several areas of the body; allergic reactions; (i.e.:  anaphylactic reaction); and/or death. Furthermore, the patient was informed of those risks and complications associated with the medications. These include, but are not limited to: allergic reactions (i.e.: anaphylactic or anaphylactoid reaction(s)); adrenal axis suppression; blood sugar elevation that in diabetics may result in ketoacidosis or comma; water retention that in patients with history of congestive heart failure may result in shortness of breath, pulmonary edema, and decompensation with resultant heart failure; weight gain; swelling or edema; medication-induced neural toxicity; particulate matter embolism and blood vessel occlusion with resultant organ, and/or nervous system infarction; and/or aseptic necrosis of one or more joints. Finally, the patient was informed that Medicine is not an exact science; therefore, there is also the possibility of unforeseen or unpredictable risks and/or possible complications that may result in a catastrophic outcome. The patient indicated having understood very clearly. We have given the patient no guarantees and we have made no promises. Enough time was given to the patient to ask questions, all of which were answered to the patient's satisfaction. [name] has indicated that she wanted to continue with the procedure. Attestation: I, the ordering provider, attest that I have discussed with the patient the benefits, risks, side-effects, alternatives, likelihood of achieving  goals, and potential problems during recovery for the procedure that I have provided informed consent. Date: 06/21/2017; Time: 10:20 AM  Pre-Procedure Preparation:  Monitoring: As per clinic protocol. Respiration, ETCO2, SpO2, BP, heart rate and rhythm monitor placed and checked for adequate function Safety Precautions: Patient was assessed for positional comfort and pressure points before starting the procedure. Time-out: I initiated and conducted the "Time-out" before starting the procedure, as per protocol. The patient was asked to participate by confirming the accuracy of the "Time Out" information. Verification of the correct person, site, and procedure were performed and confirmed by me, the nursing staff, and the patient. "Time-out" conducted as per Joint Commission's Universal Protocol (UP.01.01.01). "Time-out" Date & Time: 06/21/2017; 1120 hrs.  Description of Procedure Process:   Position: Prone Target Area: For Lumbar Facet blocks, the target is the groove formed by the junction of the transverse process and superior articular process. For the L5 dorsal ramus, the target is the notch between superior articular process and sacral ala.  Approach: Paramedial approach. Area Prepped: Entire Posterior Lumbosacral Region Prepping solution: ChloraPrep (2% chlorhexidine gluconate and 70% isopropyl alcohol) Safety Precautions: Aspiration looking for blood return was conducted prior to all injections. At no point did we inject any substances, as a needle was being advanced. No attempts were made at seeking any paresthesias. Safe injection practices and needle disposal techniques used. Medications properly checked for expiration dates. SDV (single dose vial) medications used. Description of the Procedure: Protocol guidelines were followed. The patient was placed in position over the fluoroscopy table. The target area was identified and the area prepped in the usual manner. Skin desensitized using  vapocoolant spray. Skin & deeper tissues infiltrated with local anesthetic. Appropriate amount of time allowed to pass for local anesthetics to take effect. The procedure needle was introduced through the skin, ipsilateral to the reported pain, and advanced to the target area. Employing the "Medial Branch Technique", the needles were advanced to the angle made by the superior and medial portion of the transverse process, and the lateral and inferior portion of the superior articulating process of the targeted vertebral bodies. This area is known as "Burton's Eye" or the "Eye of the [place] Dog". A procedure needle was introduced through the skin, and this time advanced to the  angle made by the superior and medial border of the sacral ala. Negative aspiration confirmed. Solution injected in intermittent fashion, asking for systemic symptoms every 0.5cc of injectate. The needles were then removed and the area cleansed, making sure to leave some of the prepping solution back to take advantage of its long term bactericidal properties.   Illustration of the posterior view of the lumbar spine and the posterior neural structures. Laminae of L2 through S1 are labeled. DPRL5, dorsal primary ramus of L5; DPRS1, dorsal primary ramus of S1; DPR3, dorsal primary ramus of L3; FJ, facet (zygapophyseal) joint L3-L4; I, inferior articular process of L4; LB1, lateral branch of dorsal primary ramus of L1; IAB, inferior articular branches from L3 medial branch (supplies L4-L5 facet joint); IBP, intermediate branch plexus; MB3, medial branch of dorsal primary ramus of L3; NR3, third lumbar nerve root; S, superior articular process of L5; SAB, superior articular branches from L4 (supplies L4-5 facet joint also); TP3, transverse process of L3.  Vitals:   06/21/17 1133 06/21/17 1138 06/21/17 1141 06/21/17 1146  BP: (!) 175/102 (!) 176/98 (!) 169/102 (!) 173/100  Pulse:      Resp: 15 12 17    Temp:      SpO2: 98% 98% 98%     Weight:      Height:        Start Time: 1120 hrs. End Time: 1139 hrs. Materials:  Needle(s) Type: Regular needle Gauge: 22G Length: 3.5-in Medication(s): We administered lidocaine, ropivacaine (PF) 2 mg/mL (0.2%), and dexamethasone. Please see chart orders for dosing details. 5 cc solution made of 4 cc of 0.2% ropivacaine, 1 cc of Decadron 10 mg/cc.  1-1.5 cc injected at each level bilaterally.  Imaging Guidance (Spinal):  Type of Imaging Technique: Fluoroscopy Guidance (Spinal) Indication(s): Assistance in needle guidance and placement for procedures requiring needle placement in or near specific anatomical locations not easily accessible without such assistance. Exposure Time: Please see nurses notes. Contrast: None used. Fluoroscopic Guidance: I was personally present during the use of fluoroscopy. "Tunnel Vision Technique" used to obtain the best possible view of the target area. Parallax error corrected before commencing the procedure. "Direction-depth-direction" technique used to introduce the needle under continuous pulsed fluoroscopy. Once target was reached, antero-posterior, oblique, and lateral fluoroscopic projection used confirm needle placement in all planes. Images permanently stored in EMR. Interpretation: No contrast injected. I personally interpreted the imaging intraoperatively. Adequate needle placement confirmed in multiple planes. Permanent images saved into the patient's record.  Antibiotic Prophylaxis:  Indication(s): None identified Antibiotic given: None  Post-operative Assessment:  EBL: None Complications: No immediate post-treatment complications observed by team, or reported by patient. Note: The patient tolerated the entire procedure well. A repeat set of vitals were taken after the procedure and the patient was kept under observation following institutional policy, for this type of procedure. Post-procedural neurological assessment was performed, showing  return to baseline, prior to discharge. The patient was provided with post-procedure discharge instructions, including a section on how to identify potential problems. Should any problems arise concerning this procedure, the patient was given instructions to immediately contact us, at any time, without hesitation. In any case, we plan to contact the patient by telephone for a follow-up status report regarding this interventional procedure. Comments:  No additional relevant information. 5 out of 5 strength bilateral lower extremity: Plantar flexion, dorsiflexion, knee flexion, knee extension.  Plan of Care  Follow-up in 3-4 weeks for postprocedural evaluation and medication management.  Okay to take over opioid  medication at that time.  Imaging Orders     DG C-Arm 1-60 Min-No Report Procedure Orders    No procedure(s) ordered today    Medications ordered for procedure: Meds ordered this encounter  Medications  . fentaNYL (SUBLIMAZE) injection 25-100 mcg    Make sure Narcan is available in the pyxis when using this medication. In the event of respiratory depression (RR< 8/min): Titrate NARCAN (naloxone) in increments of 0.1 to 0.2 mg IV at 2-3 minute intervals, until desired degree of reversal.  . lidocaine (XYLOCAINE) 1 % (with pres) injection 10 mL  . ropivacaine (PF) 2 mg/mL (0.2%) (NAROPIN) injection 10 mL  . dexamethasone (DECADRON) injection 10 mg   Medications administered: We administered lidocaine, ropivacaine (PF) 2 mg/mL (0.2%), and dexamethasone.  See the medical record for exact dosing, route, and time of administration.  This SmartLink is deprecated. Use AVSMEDLIST instead to display the medication list for a patient. Disposition: Discharge home  Discharge Date & Time: 06/21/2017; (Dr Holley Raring notified of BP. ) hrs.   Physician-requested Follow-up: Return in about 4 weeks (around 07/19/2017) for Medication Management, Post Procedure Evaluation. Future Appointments  Date Time  Provider [place]  07/18/2017 10:00 AM Gillis Santa, MD [facility] None   Primary Care Physician: Frazier Richards, MD Location: [facility] Note by: Gillis Santa, MD Date: 06/21/2017; Time: 2:10 PM  Disclaimer:  Medicine is not an exact science. The only guarantee in medicine is that nothing is guaranteed. It is important to note that the decision to proceed with this intervention was based on the information collected from the patient. The Data and conclusions were drawn from the patient's questionnaire, the interview, and the physical examination. Because the information was provided in large part by the patient, it cannot be guaranteed that it has not been purposely or unconsciously manipulated. Every effort has been made to obtain as much relevant data as possible for this evaluation. It is important to note that the conclusions that lead to this procedure are derived in large part from the available data. Always take into account that the treatment will also be dependent on availability of resources and existing treatment guidelines, considered by other Pain Management Practitioners as being common knowledge and practice, at the time of the intervention. For Medico-Legal purposes, it is also important to point out that variation in procedural techniques and pharmacological choices are the acceptable norm. The indications, contraindications, technique, and results of the above procedure should only be interpreted and judged by a Board-Certified Interventional Pain Specialist with extensive familiarity and expertise in the same exact procedure and technique.

## 2017-06-21 NOTE — Patient Instructions (Addendum)
Post-procedure Information What to expect: Most procedures involve the use of a local anesthetic (numbing medicine), and a steroid (anti-inflammatory medicine).  The local anesthetics may cause temporary numbness and weakness of the legs or arms, depending on the location of the block. This numbness/weakness may last 4-6 hours, depending on the local anesthetic used. In rare instances, it can last up to 24 hours. While numb, you must be very careful not to injure the extremity.  After any procedure, you could expect the pain to get better within 15-20 minutes. This relief is temporary and may last 4-6 hours. Once the local anesthetics wears off, you could experience discomfort, possibly more than usual, for up to 10 (ten) days. In the case of radiofrequencies, it may last up to 6 weeks. Surgeries may take up to 8 weeks for the healing process. The discomfort is due to the irritation caused by needles going through skin and muscle. To minimize the discomfort, we recommend using ice the first day, and heat from then on. The ice should be applied for 15 minutes on, and 15 minutes off. Keep repeating this cycle until bedtime. Avoid applying the ice directly to the skin, to prevent frostbite. Heat should be used daily, until the pain improves (4-10 days). Be careful not to burn yourself.  Occasionally you may experience muscle spasms or cramps. These occur as a consequence of the irritation caused by the needle sticks to the muscle and the blood that will inevitably be lost into the surrounding muscle tissue. Blood tends to be very irritating to tissues, which tend to react by going into spasm. These spasms may start the same day of your procedure, but they may also take days to develop. This late onset type of spasm or cramp is usually caused by electrolyte imbalances triggered by the steroids, at the level of the kidney. Cramps and spasms tend to respond well to muscle relaxants, multivitamins (some are  triggered by the procedure, but may have their origins in vitamin deficiencies), and "Gatorade", or any sports drinks that can replenish any electrolyte imbalances. (If you are a diabetic, ask your pharmacist to get you a sugar-free brand.) Warm showers or baths may also be helpful. Stretching exercises are highly recommended. General Instructions:  Be alert for signs of possible infection: redness, swelling, heat, red streaks, elevated temperature, and/or fever. These typically appear 4 to 6 days after the procedure. Immediately notify your doctor if you experience unusual bleeding, difficulty breathing, or loss of bowel or bladder control. If you experience increased pain, do not increase your pain medicine intake, unless instructed by your pain physician. Post-Procedure Care:  Be careful in moving about. Muscle spasms in the area of the injection may occur. Applying ice or heat to the area is often helpful. The incidence of spinal headaches after epidural injections ranges between 1.4% and 6%. If you develop a headache that does not seem to respond to conservative therapy, please let your physician know. This can be treated with an epidural blood patch.   Post-procedure numbness or redness is to be expected, however it should average 4 to 6 hours. If numbness and weakness of your extremities begins to develop 4 to 6 hours after your procedure, and is felt to be progressing and worsening, immediately contact your physician.   Diet:  If you experience nausea, do not eat until this sensation goes away. If you had a "Stellate Ganglion Block" for upper extremity "Reflex Sympathetic Dystrophy", do not eat or drink until your   hoarseness goes away. In any case, always start with liquids first and if you tolerate them well, then slowly progress to more solid foods. Activity:  For the first 4 to 6 hours after the procedure, use caution in moving about as you may experience numbness and/or weakness. Use caution in  cooking, using household electrical appliances, and climbing steps. If you need to reach your Doctor call our office: [phone] Monday-Thursday 8:00 am - 4:00 PM    Fridays: Closed     In case of an emergency: In case of emergency, call 911 or go to the nearest emergency room and have the physician there call us.  Interpretation of Procedure Every nerve block has two components: a diagnostic component, and a treatment component. Unrealistic expectations are the most common causes of "perceived failure".  In a perfect world, a single nerve block should be able to completely and permanently eliminate the pain. Sadly, the world is not perfect.  Most pain management nerve blocks are performed using local anesthetics and steroids. Steroids are responsible for any long-term benefit that you may experience. Their purpose is to decrease any chronic swelling that may exist in the area. Steroids begin to work immediately after being injected. However, most patients will not experience any benefits until 5 to 10 days after the injection, when the swelling has come down to the point where they can tell a difference. Steroids will only help if there is swelling to be treated. As such, they can assist with the diagnosis. If effective, they suggest an inflammatory component to the pain, and if ineffective, they rule out inflammation as the main cause or component of the problem. If the problem is one of mechanical compression, you will get no benefit from those steroids.   In the case of local anesthetics, they have a crucial role in the diagnosis of your condition. Most will begin to work within15 to 20 minutes after injection. The duration will depend on the type used (short- vs. Long-acting). It is of outmost importance that patients keep tract of their pain, after the procedure. To assist with this matter, a "Post-procedure Pain Diary" is provided. Make sure to complete it and to bring it back to your  follow-up appointment.  As long as the patient keeps accurate, detailed records of their symptoms after every procedure, and returns to have those interpreted, every procedure will provide us with invaluable information. Even a block that does not provide the patient with any relief, will always provide us with information about the mechanism and the origin of the pain. The only time a nerve block can be considered a waste of time is when patients do not keep track of the results, or do not keep their post-procedure appointment.  Reporting the results back to your physician The Pain Score  Pain is a subjective complaint. It cannot be seen, touched, or measured. We depend entirely on the patient's report of the pain in order to assess your condition and treatment. To evaluate the pain, we use a pain scale, where "0" means "No Pain", and a "10" is "the worst possible pain that you can even imagine" (i.e. something like been eaten alive by a shark or being torn apart by a lion).   You will frequently be asked to rate your pain. Please be as accurate, remember that medical decisions will be based on your responses. Please do not rate your pain above a 10. Doing so is actually interpreted as "symptom magnification" (exaggeration), as   well as lack of understanding with regards to the scale. To put this into perspective, when you tell us that your pain is at a 10 (ten), what you are saying is that there is nothing we can do to make this pain any worse. (Carefully think about that.)Lysis of Epidural Adhesions Utilizing the Epidural Approach (RACZ Epidural Adhesiolysis)  Introduction Bleeding into the epidural space following surgery or leakage of disc material following breakage or a tear of a disc most commonly causes epidural scarring.  Presumably, inflammation and compression of nerve roots by epidural scar (Adhesions) are the mechanism of persistent pain following back surgery, ruptured or herniated discs,  or vertebral body fracture.  Epidural scar may also contribute to the pain of spinal column metastatic carcinoma, failed facet joint syndrome, and unexplained neck or low back pain.   Diagnosis Conventional studies such as myelograms, computerized tomography (CT Scans), and magnetic resonance imaging (MRI), are usually inadequate to make the diagnosis.  Injection of contrast material (dye) into the epidural space yields an "epidurogram", which is diagnostic for the presence or absence of epidural scar tissue.   Procedure Since further surgery would only produce more scar tissue, a different approach to the problem needs to be taken.  "Lysis of Epidural Adhesions" (Breakage of scar tissue) is an alternative to this problem.  The procedure consists of introducing an epidural catheter (thin plastic tube) into the epidural space (space between your spinal cord and the walls of the spinal canal, within your backbone).  Once in place, medications are injected through this tubing in order to break the scar tissue.  Although the catheter is placed within fifteen to twenty minutes, it is kept in the epidural space for a couple of hours.  During this time, the injection of medications through the tubing is performed.  At the end of the procedure the catheter is removed and the patient discharged to home.   Conditions Usually Treated with This Modality Conditions include, but are not limited to: [name] Failed Back Surgery Syndrome . Post-Laminectomy Syndrome . Epidural Adhesions . Back and leg pain . Sciatica . Radiculopathy . Neck and arm pain . Leg pain . Disc disruption . Traumatic vertebral body compression fracture . Degenerative arthritis of the spine . Facet pain . Epidural scarring following infection . Occipital neuralgia . Others  Side Effects and Possible Complications Everything in medicine is subject to side effects and possible complications.  No two patients are alike.  Side effects and  complications are not the same or equivalent to malpractice.  Malpractice refers to an injury sustained by a patient, which occurs as a consequence of negligence in the practice of medicine.  Side effects and complications, on the other hand, are untoward events, which can occur and may injure a patient, in certain percentages of the population.  These events can, and do occur even if everything goes according to plan, and in the absence of negligence or malpractice.  Possible side effects and complications of this procedure include, but are not limited to. . Pain or worsening of symptoms . Infection (local = abscess; or generalized = sepsis), including meningitis and death. Infection due to a steroid-induced immune system suppression. . Bleeding, including hematomas, which might compress the spinal cord, therefore causing paralysis. . Nerve damage, including sensory or motor weakness, and/or paralysis. Nerve damage, ranging from minor nerve irritation with pain, to major nerve damage with paralysis, impotence, urinary incontinence, and/or fecal incontinence. . Allergic reactions ranging from a minor rush  to an anaphylactic reaction and death . Failure to relieve pain . Spinal cord compression leading to paralysis . Breakage of catheter (tubing) . Unforeseen events:   Guarantees That It Will Help None. There are no guarantees in medicine.   Results of Treatment 93.9% of patients will experience some pain relief, which will be variable.  6.1% will experience no relief.     Duration of Results Only 12.3% will experience persistent pain relief beyond 12 months.  57.9% of female patients and 64.4% of female patients will experience between 0-3 months of variable degrees of pain relief.   Frequently Asked Questions  What is the incidence of complications? It is unknown.  Most complications occur sporadically and are reported as isolated case reports.  Can the procedure be repeated when and if my  pain returns? Yes.  The effectiveness of subsequent repeat procedures is also variable.  Some patients obtain longer duration of pain relief while others obtain shorter duration.  Does this require that I be hospitalized? No.  On the average the procedure takes anywhere from 1 to 3 hours to complete.  What should I expect to feel after the procedure? If the procedure is successful, you should experience pain relief of variable degrees.  Will I obtain complete (100%) pain relief? Although possible, it is our experience that patients suffering from chronic pain, the cause of the pain is multifactorial, and therefore, highly unlikely to completely address the problem with only one type of therapy.  What is an Epidurolysis (RACZ) Procedure? Epidurolysis (RACZ) Procedure is used to dissolve some of the scar tissue from around entrapped nerves in the Epidural space of spine, so that medications such as cortisone can reach the affected areas. Dr. Kari Baars pioneered this procedure.   What causes scarring (adhesions)? Scarring is most commonly caused from bleeding into the Epidural space following back surgery and the subsequent healing process. It is a natural occurrence following surgical intervention. Sometimes scarring can also occur when a disk ruptures and its contents leak out.   What is the purpose of it? To allow medications to reach affected nerves so that pain and other symptoms may be diminished.   How long does the procedure take?  First, a small catheter (surgical tube) is inserted in the Epidural space up to the area where the scar is located. This is done under sterile conditions using x-ray guidance. This epidural catheter is secured to the skin using tape and the patient is transported to the recovery room. This part may take 45 minutes. While in the recovery area, series of medications will be injected via the catheter. This part may take 1.0 to 2.0 hours. The patient is kept in the  recovery room for the duration of the procedure. Once all medications are injected, then the catheter is removed. The actual injections only take a few minutes, however, the procedure protocol calls for certain periods of time to elapse between the administration of medications.   What is actually injected? The injection consists of a mixture of a local anesthetic (numbing medicine), a steroid (anti-inflammatory medication), radiological contrast dye (to visualize the scar tissue), hyaluronidase (medicine to soften the scar), and a hyper-concentrated solution sterile salt water (to remove fluid from the swollen nerves via osmosis).   Will the injection hurt? Patients receive intravenous sedation and local infiltration of numbing medicine to minimize the discomfort from the procedure. Because the procedure involves the insertion of a needle, some discomfort is to be expected. In addition,  because there is scar tissue and swelling inside of the epidural space, as the medications are injected, the sensation of pressure should be expected.   Will I be "put out" for this procedure? No. This procedure is done under local anesthesia and sedation, which makes the procedure easy to tolerate. The amount of sedation given generally depends upon the patient's tolerance. Communication with the patients during the procedure is essential to avoid complications.   How is the procedure performed? Catheter placement is done with the patient lying on their stomach. During the procedure, the patient is monitored with a heart monitor, blood pressure monitor, and a blood oxygen monitoring device. The skin in the back is cleaned with antiseptic solution and then the procedure is carried out. After the procedure, you are placed on your back or on your side. X-rays are used to assist in the placement of the catheter and to perform the epidurogram (injection of radiological contrast to confirm adequate placement).   What should  I expect after the injection? Immediately after the injection, you may feel your legs slightly heavy and numb. Also, you may notice that your pain may improve or disappear. This may only last for a few hours and it is due to the local anesthetics (numbing medicine).    When can I return to work? Unless there are any complications, you should be able to return to work the next day. However, it may take a couple of days for you to get the full benefit of the procedure.   How long the effects of the medication last? The immediate effect is usually from the local anesthetic injected. This wears off in a few hours. The cortisone starts working in about 5 to 10 days and its effect can last for several days to a few months. The benefits from the procedure may last as long as 6 month.   How many times do I need to have this procedure performed? If the first procedure does not relieve your symptoms within one to two weeks, you may be recommended to have one more procedure. After this, we recommend 6 months between [facility] procedures.   Will the Epidurolysis (RACZ) Procedure help me? It is very difficult to predict if the procedure will indeed help you or not. Generally speaking, the patients who have recent scarring (e.g. following back surgery) respond better.    What are the risks and side effects? Generally speaking, this procedure is relatively safe. However, with any procedure there are risks and possible complications. These include, but are not limited to: spinal headaches; central nervous system infections such as meningitis, or epidural abscesses; epidural hematomas with possible spinal cord compression and subsequent permanent paralysis; worsening of symptoms; failure to provide relief; nerve damage; urinary or fecal incontinence; blood vessel damage, etc. Side effects related to the steroids, such as: weight gain; increase in blood sugar (mainly in diabetics); water retention; suppression of body's  immune system; mood disturbances, etc.. Some of patients may have anaphylactic allergic reactions to hyaluronidase. Fortunately, the serious side effects and complications are uncommon.   Who should not have this procedure? If you are allergic to any of the medications to be injected, if you are on a blood thinning medication (e.i. Coumadin), or if you have an active infection going on, you should not have the injection. The following are absolute contraindications for performing epidural adhesiolysis: 1. Sepsis 2. Chronic infection 3. Coagulopathy (Bleeding disorder) 4. Local infection at the procedure site 5. Patient refusal 6.  Syrinx formation 7. A relative contraindication is the presence of arachnoiditis.  How do I prepare for the procedure? 1. Do not eat or drink for 6 hours before the procedure. 2. If you take blood pressure medication in the morning, take it as usual, with just a sip of water. 3. Take a shower the morning of the procedure using an antibacterial soap such as "Right Guard". 4. Take a 1000 mg Vitamin C every day, before and after the procedure to build up your immune system and decrease the risk of infections. 5. You may drink water up to 2 hours before the procedure. Empty your bladder before getting your IV started.Facet Blocks Patient Information  Description: The facets are joints in the spine between the vertebrae.  Like any joints in the body, facets can become irritated and painful.  Arthritis can also effect the facets.  By injecting steroids and local anesthetic in and around these joints, we can temporarily block the nerve supply to them.  Steroids act directly on irritated nerves and tissues to reduce selling and inflammation which often leads to decreased pain.  Facet blocks may be done anywhere along the spine from the neck to the low back depending upon the location of your pain.   After numbing the skin with local anesthetic (like Novocaine), a small needle  is passed onto the facet joints under x-ray guidance.  You may experience a sensation of pressure while this is being done.  The entire block usually lasts about 15-25 minutes.   Conditions which may be treated by facet blocks:   Low back/buttock pain  Neck/shoulder pain  Certain types of headaches  Preparation for the injection:  1. Do not eat any solid food or dairy products within 8 hours of your appointment. 2. You may drink clear liquid up to 3 hours before appointment.  Clear liquids include water, black coffee, juice or soda.  No milk or cream please. 3. You may take your regular medication, including pain medications, with a sip of water before your appointment.  Diabetics should hold regular insulin (if taken separately) and take 1/2 normal NPH dose the morning of the procedure.  Carry some sugar containing items with you to your appointment. 4. A driver must accompany you and be prepared to drive you home after your procedure. 5. Bring all your current medications with you. 6. An IV may be inserted and sedation may be given at the discretion of the physician. 7. A blood pressure cuff, EKG and other monitors will often be applied during the procedure.  Some patients may need to have extra oxygen administered for a short period. 8. You will be asked to provide medical information, including your allergies and medications, prior to the procedure.  We must know immediately if you are taking blood thinners (like Coumadin/Warfarin) or if you are allergic to IV iodine contrast (dye).  We must know if you could possible be pregnant.  Possible side-effects:   Bleeding from needle site  Infection (rare, may require surgery)  Nerve injury (rare)  Numbness & tingling (temporary)  Difficulty urinating (rare, temporary)  Spinal headache (a headache worse with upright posture)  Light-headedness (temporary)  Pain at injection site (serveral days)  Decreased blood pressure (rare,  temporary)  Weakness in arm/leg (temporary)  Pressure sensation in back/neck (temporary)   Call if you experience:   Fever/chills associated with headache or increased back/neck pain  Headache worsened by an upright position  New onset, weakness or numbness of  an extremity below the injection site  Hives or difficulty breathing (go to the emergency room)  Inflammation or drainage at the injection site(s)  Severe back/neck pain greater than usual  New symptoms which are concerning to you  Please note:  Although the local anesthetic injected can often make your back or neck feel good for several hours after the injection, the pain will likely return. It takes 3-7 days for steroids to work.  You may not notice any pain relief for at least one week.  If effective, we will often do a series of 2-3 injections spaced 3-6 weeks apart to maximally decrease your pain.  After the initial series, you may be a candidate for a more permanent nerve block of the facets.  If you have any questions, please call #[phone] [facility]

## 2017-06-21 NOTE — Progress Notes (Signed)
[name] Name: Stacy Allen  MRN: [id number]  Referring Provider: Gillis Santa, MD  DOB: [birth year]  PCP: Frazier Richards, MD  DOS: 06/21/2017  Note by: Gillis Santa, MD  Service setting: Ambulatory outpatient  Specialty: Interventional Pain Management  Patient type: Established  Location: [facility] (AMB) Pain Management Facility  Visit type: Interventional Procedure   Primary Reason for Visit: Interventional Pain Management Treatment. 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 Lumbar spondylosis    Pain Score: Pre-procedure: 9 /10 Post-procedure: 9 /10  Pre-op Assessment:  Stacy Allen is a 76 y.o. (year old), female patient, seen today for interventional treatment. She  has a past surgical history that includes Abdominal hysterectomy; Cardiac catheterization (N/A, 04/03/2015); Back surgery (2007); Cholecystectomy; Fracture surgery (Left, 1991); Coronary angioplasty (2015); Image guided sinus surgery (Bilateral, 09/28/2015); and Esophagogastroduodenoscopy (egd) with propofol (N/A, 12/21/2016). Stacy Allen has a current medication list which includes the following prescription(s): acetaminophen, albuterol, amlodipine, celecoxib, cetirizine, citalopram, clonazepam, diclofenac sodium, enalapril, fluticasone, gabapentin, hydrocodone-acetaminophen, levothyroxine, loratadine, meclizine, metoprolol tartrate, nitroglycerin, omeprazole, ranitidine, ciprofloxacin, ergocalciferol, methylprednisolone, phenazopyridine, pravastatin, prednisone, risperidone, and  sulfamethoxazole-trimethoprim, and the following Facility-Administered Medications: fentanyl. Her primarily concern today is the Hip Pain (left)  Initial Vital Signs: Blood pressure (!) 175/93, pulse (!) 55, temperature (!) 97.5 F (36.4 C), height 5\' 3"  (1.6 m), weight 223 lb (101.2 kg), SpO2 100 %. BMI: Estimated body mass index is 39.5 kg/m as calculated from the following:   Height as of this encounter: 5\' 3"  (1.6 m).   Weight as of this encounter: 223 lb (101.2 kg).  Risk Assessment: Allergies: Reviewed. She is allergic to zithromax [azithromycin]; adhesive [tape]; codeine; lactose intolerance (gi); lodine [etodolac]; morphine and related; red dye; shellfish-derived products; and latex.  Allergy Precautions: None required Coagulopathies: Reviewed. None identified.  Blood-thinner therapy: None at this time Active Infection(s): Reviewed. None identified. Stacy Allen is afebrile  Site Confirmation: Ms. Roswell was asked to confirm the procedure and laterality before marking the site Procedure checklist: Completed Consent: Before the procedure and under the influence of no sedative(s), amnesic(s), or anxiolytics, the patient was informed of the treatment options, risks and possible complications. To fulfill our ethical and legal obligations, as recommended by the American Medical Association's Code of Ethics, I have informed the patient of my clinical impression; the nature and purpose of the treatment or procedure; the risks, benefits, and possible complications of the intervention; the alternatives, including doing nothing; the risk(s) and benefit(s) of the alternative treatment(s) or procedure(s); and the risk(s) and benefit(s) of doing nothing. The patient was provided information about the general risks and possible complications associated with the procedure. These may include, but are not limited to: failure to achieve desired goals, infection, bleeding, organ or nerve damage, allergic  reactions, paralysis, and death. In addition, the patient was informed of those risks and complications associated to the procedure, such as failure to decrease pain; infection; bleeding; organ or nerve damage with subsequent damage to sensory, motor, and/or autonomic systems, resulting in permanent pain, numbness, and/or weakness of one or several areas of the body; allergic reactions; (i.e.: anaphylactic reaction); and/or death. Furthermore, the  patient was informed of those risks and complications associated with the medications. These include, but are not limited to: allergic reactions (i.e.: anaphylactic or anaphylactoid reaction(s)); adrenal axis suppression; blood sugar elevation that in diabetics may result in ketoacidosis or comma; water retention that in patients with history of congestive heart failure may result in shortness of breath, pulmonary edema, and decompensation with resultant heart failure; weight gain; swelling or edema; medication-induced neural toxicity; particulate matter embolism and blood vessel occlusion with resultant organ, and/or nervous system infarction; and/or aseptic necrosis of one or more joints. Finally, the patient was informed that Medicine is not an exact science; therefore, there is also the possibility of unforeseen or unpredictable risks and/or possible complications that may result in a catastrophic outcome. The patient indicated having understood very clearly. We have given the patient no guarantees and we have made no promises. Enough time was given to the patient to ask questions, all of which were answered to the patient's satisfaction. Ms. Houchin has indicated that she wanted to continue with the procedure. Attestation: I, the ordering provider, attest that I have discussed with the patient the benefits, risks, side-effects, alternatives, likelihood of achieving goals, and potential problems during recovery for the procedure that I have provided informed  consent. Date: 06/21/2017; Time: 10:24 AM  Pre-Procedure Preparation:  Monitoring: As per clinic protocol. Respiration, ETCO2, SpO2, BP, heart rate and rhythm monitor placed and checked for adequate function Safety Precautions: Patient was assessed for positional comfort and pressure points before starting the procedure. Time-out: I initiated and conducted the "Time-out" before starting the procedure, as per protocol. The patient was asked to participate by confirming the accuracy of the "Time Out" information. Verification of the correct person, site, and procedure were performed and confirmed by me, the nursing staff, and the patient. "Time-out" conducted as per Joint Commission's Universal Protocol (UP.01.01.01). "Time-out" Date & Time: 06/21/2017; 1120 hrs.  Description of Procedure Process:  Position: Prone Target Area: Inferior, posterior, aspect of the sacroiliac fissure Approach: Posterior, paraspinal, ipsilateral approach. Area Prepped: Entire Lower Lumbosacral Region Prepping solution: ChloraPrep (2% chlorhexidine gluconate and 70% isopropyl alcohol) Safety Precautions: Aspiration looking for blood return was conducted prior to all injections. At no point did we inject any substances, as a needle was being advanced. No attempts were made at seeking any paresthesias. Safe injection practices and needle disposal techniques used. Medications properly checked for expiration dates. SDV (single dose vial) medications used. Description of the Procedure: Protocol guidelines were followed. The patient was placed in position over the procedure table. The target area was identified and the area prepped in the usual manner. Skin & deeper tissues infiltrated with local anesthetic. Appropriate amount of time allowed to pass for local anesthetics to take effect. The procedure needle was advanced under fluoroscopic guidance into the sacroiliac joint until a firm endpoint was obtained. Proper needle  placement secured. Negative aspiration confirmed. Solution injected in intermittent fashion, asking for systemic symptoms every 0.5cc of injectate. The needles were then removed and the area cleansed, making sure to leave some of the prepping solution back to take advantage of its long term bactericidal properties. Vitals:   06/21/17 1133 06/21/17 1138 06/21/17 1141 06/21/17 1146  BP: (!) 175/102 (!) 176/98 (!) 169/102 (!) 173/100  Pulse:      Resp: 15 12 17    Temp:      SpO2: 98% 98% 98%   Weight:      Height:        Start Time:  1120 hrs. End Time: 1139 hrs. Materials:  Needle(s) Type: Regular needle Gauge: 22G Length: 3.5-in Medication(s): We administered lidocaine, ropivacaine (PF) 2 mg/mL (0.2%), and dexamethasone. Please see chart orders for dosing details. 5 cc solution made of 4 cc of 0.2% ropivacaine, 1 cc of Decadron 10 mg/cc.  2.5 cc injected on each side, 1.5 cc intra-articular, 1 cc periarticular Imaging Guidance (Non-Spinal):  Type of Imaging Technique: Fluoroscopy Guidance (Non-Spinal) Indication(s): Assistance in needle guidance and placement for procedures requiring needle placement in or near specific anatomical locations not easily accessible without such assistance. Exposure Time: Please see nurses notes. Contrast: Before injecting any contrast, we confirmed that the patient did not have an allergy to iodine, shellfish, or radiological contrast. Once satisfactory needle placement was completed at the desired level, radiological contrast was injected. Contrast injected under live fluoroscopy. No contrast complications. See chart for type and volume of contrast used. Fluoroscopic Guidance: I was personally present during the use of fluoroscopy. "Tunnel Vision Technique" used to obtain the best possible view of the target area. Parallax error corrected before commencing the procedure. "Direction-depth-direction" technique used to introduce the needle under continuous pulsed  fluoroscopy. Once target was reached, antero-posterior, oblique, and lateral fluoroscopic projection used confirm needle placement in all planes. Images permanently stored in EMR. Interpretation: I personally interpreted the imaging intraoperatively. Adequate needle placement confirmed in multiple planes. Appropriate spread of contrast into desired area was observed. No evidence of afferent or efferent intravascular uptake. Permanent images saved into the patient's record.  Antibiotic Prophylaxis:  Indication(s): None identified Antibiotic given: None  Post-operative Assessment:  EBL: None Complications: No immediate post-treatment complications observed by team, or reported by patient. Note: The patient tolerated the entire procedure well. A repeat set of vitals were taken after the procedure and the patient was kept under observation following institutional policy, for this type of procedure. Post-procedural neurological assessment was performed, showing return to baseline, prior to discharge. The patient was provided with post-procedure discharge instructions, including a section on how to identify potential problems. Should any problems arise concerning this procedure, the patient was given instructions to immediately contact us, at any time, without hesitation. In any case, we plan to contact the patient by telephone for a follow-up status report regarding this interventional procedure. Comments:  No additional relevant information. 5 out of 5 strength bilateral lower extremity: Plantar flexion, dorsiflexion, knee flexion, knee extension.  Plan of Care    Imaging Orders     DG C-Arm 1-60 Min-No Report Procedure Orders    No procedure(s) ordered today    Medications ordered for procedure: Meds ordered this encounter  Medications  . fentaNYL (SUBLIMAZE) injection 25-100 mcg    Make sure Narcan is available in the pyxis when using this medication. In the event of respiratory depression  (RR< 8/min): Titrate NARCAN (naloxone) in increments of 0.1 to 0.2 mg IV at 2-3 minute intervals, until desired degree of reversal.  . lidocaine (XYLOCAINE) 1 % (with pres) injection 10 mL  . ropivacaine (PF) 2 mg/mL (0.2%) (NAROPIN) injection 10 mL  . dexamethasone (DECADRON) injection 10 mg   Medications administered: We administered lidocaine, ropivacaine (PF) 2 mg/mL (0.2%), and dexamethasone.  See the medical record for exact dosing, route, and time of administration.  This SmartLink is deprecated. Use AVSMEDLIST instead to display the medication list for a patient. Disposition: Discharge home  Discharge Date & Time: 06/21/2017; (Dr Holley Raring notified of BP. ) hrs.   Physician-requested Follow-up: Return in about 4 weeks (  around 07/19/2017) for Medication Management, Post Procedure Evaluation. Future Appointments  Date Time Provider Reserve  07/18/2017 10:00 AM Gillis Santa, MD [facility] None   Primary Care Physician: Frazier Richards, MD Location: [facility] Facility Note by: Gillis Santa, MD Date: 06/21/2017; Time: 2:08 PM  Disclaimer:  Medicine is not an exact science. The only guarantee in medicine is that nothing is guaranteed. It is important to note that the decision to proceed with this intervention was based on the information collected from the patient. The Data and conclusions were drawn from the patient's questionnaire, the interview, and the physical examination. Because the information was provided in large part by the patient, it cannot be guaranteed that it has not been purposely or unconsciously manipulated. Every effort has been made to obtain as much relevant data as possible for this evaluation. It is important to note that the conclusions that lead to this procedure are derived in large part from the available data. Always take into account that the treatment will also be dependent on availability of resources and existing treatment  guidelines, considered by other Pain Management Practitioners as being common knowledge and practice, at the time of the intervention. For Medico-Legal purposes, it is also important to point out that variation in procedural techniques and pharmacological choices are the acceptable norm. The indications, contraindications, technique, and results of the above procedure should only be interpreted and judged by a Board-Certified Interventional Pain Specialist with extensive familiarity and expertise in the same exact procedure and technique.

## 2017-06-22 ENCOUNTER — Telehealth: Payer: Self-pay

## 2017-06-23 ENCOUNTER — Ambulatory Visit
Admission: RE | Admit: 2017-06-23 | Discharge: 2017-06-23 | Disposition: A | Discharge status: Home / Self Care | Payer: Medicare HMO | Source: Ambulatory Visit | Attending: Family Medicine | Admitting: Family Medicine

## 2017-06-23 ENCOUNTER — Other Ambulatory Visit: Payer: Self-pay | Admitting: Family Medicine

## 2017-06-23 DIAGNOSIS — S299XXA Unspecified injury of thorax, initial encounter: Secondary | ICD-10-CM

## 2017-06-23 DIAGNOSIS — R937 Abnormal findings on diagnostic imaging of other parts of musculoskeletal system: Secondary | ICD-10-CM | POA: Diagnosis not present

## 2017-07-18 ENCOUNTER — Ambulatory Visit
Payer: Medicare HMO | Attending: Student in an Organized Health Care Education/Training Program | Admitting: Student in an Organized Health Care Education/Training Program

## 2017-07-18 ENCOUNTER — Other Ambulatory Visit: Payer: Self-pay

## 2017-07-18 ENCOUNTER — Encounter: Payer: Self-pay | Admitting: Student in an Organized Health Care Education/Training Program

## 2017-07-18 VITALS — BP 160/85 | HR 56 | Temp 97.6°F | Resp 16 | Ht 63.0 in | Wt 225.0 lb

## 2017-07-18 DIAGNOSIS — E785 Hyperlipidemia, unspecified: Secondary | ICD-10-CM | POA: Insufficient documentation

## 2017-07-18 DIAGNOSIS — M48061 Spinal stenosis, lumbar region without neurogenic claudication: Secondary | ICD-10-CM | POA: Insufficient documentation

## 2017-07-18 DIAGNOSIS — E039 Hypothyroidism, unspecified: Secondary | ICD-10-CM | POA: Diagnosis not present

## 2017-07-18 DIAGNOSIS — I2510 Atherosclerotic heart disease of native coronary artery without angina pectoris: Secondary | ICD-10-CM | POA: Diagnosis not present

## 2017-07-18 DIAGNOSIS — I10 Essential (primary) hypertension: Secondary | ICD-10-CM | POA: Diagnosis not present

## 2017-07-18 DIAGNOSIS — Z79899 Other long term (current) drug therapy: Secondary | ICD-10-CM | POA: Insufficient documentation

## 2017-07-18 DIAGNOSIS — Z6839 Body mass index (BMI) 39.0-39.9, adult: Secondary | ICD-10-CM | POA: Insufficient documentation

## 2017-07-18 DIAGNOSIS — D649 Anemia, unspecified: Secondary | ICD-10-CM | POA: Insufficient documentation

## 2017-07-18 DIAGNOSIS — G894 Chronic pain syndrome: Secondary | ICD-10-CM | POA: Diagnosis not present

## 2017-07-18 DIAGNOSIS — M810 Age-related osteoporosis without current pathological fracture: Secondary | ICD-10-CM | POA: Insufficient documentation

## 2017-07-18 DIAGNOSIS — K449 Diaphragmatic hernia without obstruction or gangrene: Secondary | ICD-10-CM | POA: Diagnosis not present

## 2017-07-18 DIAGNOSIS — F419 Anxiety disorder, unspecified: Secondary | ICD-10-CM | POA: Diagnosis not present

## 2017-07-18 DIAGNOSIS — G4730 Sleep apnea, unspecified: Secondary | ICD-10-CM | POA: Diagnosis not present

## 2017-07-18 DIAGNOSIS — M5136 Other intervertebral disc degeneration, lumbar region: Secondary | ICD-10-CM | POA: Insufficient documentation

## 2017-07-18 DIAGNOSIS — M47816 Spondylosis without myelopathy or radiculopathy, lumbar region: Secondary | ICD-10-CM | POA: Insufficient documentation

## 2017-07-18 DIAGNOSIS — E559 Vitamin D deficiency, unspecified: Secondary | ICD-10-CM | POA: Insufficient documentation

## 2017-07-18 DIAGNOSIS — M47818 Spondylosis without myelopathy or radiculopathy, sacral and sacrococcygeal region: Secondary | ICD-10-CM

## 2017-07-18 DIAGNOSIS — Z885 Allergy status to narcotic agent status: Secondary | ICD-10-CM | POA: Diagnosis not present

## 2017-07-18 DIAGNOSIS — K219 Gastro-esophageal reflux disease without esophagitis: Secondary | ICD-10-CM | POA: Diagnosis not present

## 2017-07-18 DIAGNOSIS — E669 Obesity, unspecified: Secondary | ICD-10-CM | POA: Diagnosis not present

## 2017-07-18 DIAGNOSIS — M4698 Unspecified inflammatory spondylopathy, sacral and sacrococcygeal region: Secondary | ICD-10-CM

## 2017-07-18 DIAGNOSIS — Z87891 Personal history of nicotine dependence: Secondary | ICD-10-CM | POA: Diagnosis not present

## 2017-07-18 DIAGNOSIS — F329 Major depressive disorder, single episode, unspecified: Secondary | ICD-10-CM | POA: Insufficient documentation

## 2017-07-18 NOTE — Progress Notes (Signed)
Safety precautions to be maintained throughout the outpatient stay will include: orient to surroundings, keep bed in low position, maintain call bell within reach at all times, provide assistance with transfer out of bed and ambulation.  

## 2017-07-18 NOTE — Progress Notes (Signed)
[name] Name: Stacy Allen  MRN: [id number]  Referring Provider: Frazier Richards, MD  DOB: 1940-09-01  PCP: Frazier Richards, MD  DOS: 07/18/2017  Note by: Gillis Santa, MD  Service setting: Ambulatory outpatient  Specialty: Interventional Pain Management  Location: [facility] (AMB) Pain Management Facility    Patient type: Established   Primary Reason(s) for Visit: Encounter for post-procedure evaluation of chronic illness with mild to moderate exacerbation CC: Back Pain (lower)  HPI  Stacy Allen is a 77 y.o. year old, female patient, who comes today for a post-procedure evaluation. She has Lumbar spondylosis; Lumbar degenerative disc disease; History of lumbar fusion; Age related osteoporosis; Chronic pain syndrome; and SI joint arthritis (HCC) on their problem list. Her primarily concern today is the Back Pain (lower)  Pain Assessment: Location: Left, Right, Lower Back Radiating: radiates to left buttocks/hip down to mid thigh, right side radiates around to groin Onset: More than a month ago Duration: Chronic pain Quality: Constant, Aching, Dull, Discomfort, Grimacing, Burning, Numbness Severity: 4 /10 (self-reported pain score)  Note: Reported level is compatible with observation.                         When using our objective Pain Scale, levels between 6 and 10/10 are said to belong in an emergency room, as it progressively worsens from a 6/10, described as severely limiting, requiring emergency care not usually available at an outpatient pain management facility. At a 6/10 level, communication becomes difficult and requires great effort. Assistance to reach the emergency department may be required. Facial flushing and profuse sweating along with potentially dangerous increases in heart rate and blood pressure will be evident. Effect on ADL: prolonged shopping, prolonged sitting, standing Timing: Constant Modifying factors: medications,bio freeze, heat, ice, rest  Stacy Allen comes in today  for post-procedure evaluation after the treatment done on 06/21/2017.  Patient returns today for follow-up status post bilateral L1/2, L5/S1 bilateral facet steroid injections along with bilateral SI joint injections.  Patient notes approximately 70% pain relief after the procedure but states that in the last week she has had a return of her right groin pain which is very severe.  She is still endorsing benefit in regards to her axial low back pain.  The day after the procedure, patient did have a mechanical fall which resulted in a rib fracture as she tripped and hit her side mirror.  Otherwise the patient has had any injections since her last visit.  She is pleased with the relief that she got from her injections and is interested in repeating.  Further details on both, my assessment(s), as well as the proposed treatment plan, please see below.  Post-Procedure Assessment  06/21/2017 Procedure: Bilateral SI joint injection, bilateral L1/2, L5/S1 facet steroid injection Pre-procedure pain score:  9/10 Post-procedure pain score: 9/10         Influential Factors: BMI: 39.86 kg/m Intra-procedural challenges: None observed.         Assessment challenges: None detected.              Reported side-effects: None.        Post-procedural adverse reactions or complications: None reported         Sedation: Please see nurses note. When no sedatives are used, the analgesic levels obtained are directly associated to the effectiveness of the local anesthetics. However, when sedation is provided, the level of analgesia obtained during the initial 1 hour following the intervention, is  believed to be the result of a combination of factors. These factors may include, but are not limited to: 1. The effectiveness of the local anesthetics used. 2. The effects of the analgesic(s) and/or anxiolytic(s) used. 3. The degree of discomfort experienced by the patient at the time of the procedure. 4. The patients ability and  reliability in recalling and recording the events. 5. The presence and influence of possible secondary gains and/or psychosocial factors. Reported result: Relief experienced during the 1st hour after the procedure: 100 % (Ultra-Short Term Relief)            Interpretative annotation: Clinically appropriate result. Analgesia during this period is likely to be Local Anesthetic and/or IV Sedative (Analgesic/Anxiolytic) related.          Effects of local anesthetic: The analgesic effects attained during this period are directly associated to the localized infiltration of local anesthetics and therefore cary significant diagnostic value as to the etiological location, or anatomical origin, of the pain. Expected duration of relief is directly dependent on the pharmacodynamics of the local anesthetic used. Long-acting (4-6 hours) anesthetics used.  Reported result: Relief during the next 4 to 6 hour after the procedure: 90 % (Short-Term Relief)            Interpretative annotation: Clinically appropriate result. Analgesia during this period is likely to be Local Anesthetic-related.          Long-term benefit: Defined as the period of time past the expected duration of local anesthetics (1 hour for short-acting and 4-6 hours for long-acting). With the possible exception of prolonged sympathetic blockade from the local anesthetics, benefits during this period are typically attributed to, or associated with, other factors such as analgesic sensory neuropraxia, antiinflammatory effects, or beneficial biochemical changes provided by agents other than the local anesthetics.  Reported result: Extended relief following procedure: 50 % (Long-Term Relief)            Interpretative annotation: Clinically appropriate result. Good relief. No permanent benefit expected. Inflammation plays a part in the etiology to the pain.          Current benefits: Defined as reported results that persistent at this point in time.    Analgesia: 50 %            Function: Somewhat improved ROM: Somewhat improved Interpretative annotation: Partial relief. Limited therapeutic benefit. Effective diagnostic intervention.          Interpretation: Results would suggest a successful diagnostic intervention.                  Plan:  Repeat treatment or therapy and compare extent and duration of benefits.        Laboratory Chemistry  Inflammation Markers (CRP: Acute Phase) (ESR: Chronic Phase) No results found for: CRP, ESRSEDRATE, LATICACIDVEN               Rheumatology Markers No results found for: RF, ANA, Therisa D[place], [facility]              Renal Function Markers Lab Results  Component Value Date   BUN 10 09/13/2012   CREATININE 0.85 09/13/2012   GFRAA >60 11/20/2011   GFRNONAA >60 11/20/2011                 Hepatic Function Markers Lab Results  Component Value Date   AST 15 09/13/2012   ALT 19 09/13/2012   ALBUMIN 3.5 09/13/2012   ALKPHOS 100 09/13/2012  Electrolytes Lab Results  Component Value Date   NA 136 09/13/2012   K 3.3 (L) 09/13/2012   CL 103 09/13/2012   CALCIUM 8.6 09/13/2012                 Neuropathy Markers No results found for: VITAMINB12, FOLATE, HGBA1C, HIV               Bone Pathology Markers No results found for: VD25OH, [id number], EG3151VO1, YW7371GG2, 25OHVITD1, 25OHVITD2, 25OHVITD3, TESTOFREE, TESTOSTERONE               Coagulation Parameters Lab Results  Component Value Date   PLT 248 09/22/2015                 Cardiovascular Markers Lab Results  Component Value Date   CKTOTAL 54 09/13/2012   CKMB 1.0 09/13/2012   TROPONINI < 0.02 09/13/2012   HGB 11.6 (L) 09/22/2015   HCT 35.4 09/22/2015                 CA Markers No results found for: CEA, CA125, LABCA2               Note: Lab results reviewed.  Recent Diagnostic Imaging Results  DG Ribs Unilateral Left CLINICAL DATA:  Injury to left anterior lower ribs,  painful inspiration  EXAM: LEFT RIBS - 2 VIEW  COMPARISON:  04/11/2012  FINDINGS: Sub acute to possible old fracture left fourth rib. Probable acute left tenth rib fracture. Negative for pneumothorax or pleural effusion. Scoliosis of the spine with postsurgical changes of the lumbar spine  IMPRESSION: 1. Probable acute left tenth rib fracture 2. Possible old left fourth rib fracture  Electronically Signed   By: Donavan Foil M.D.   On: 06/23/2017 14:17  Complexity Note: Imaging results reviewed. Results shared with Stacy Allen, using Layman's terms.                         Meds   Current Outpatient Medications:  .  acetaminophen (TYLENOL) 500 MG tablet, Take 500 mg by mouth every 6 (six) hours as needed., Disp: , Rfl:  .  albuterol (PROVENTIL HFA;VENTOLIN HFA) 108 (90 BASE) MCG/ACT inhaler, Inhale 2 puffs into the lungs., Disp: , Rfl:  .  amLODipine (NORVASC) 5 MG tablet, Take 5 mg by mouth 2 (two) times daily., Disp: , Rfl:  .  celecoxib (CELEBREX) 200 MG capsule, Take 200 mg by mouth 3 (three) times a week. prn, Disp: , Rfl:  .  cetirizine (ZYRTEC) 10 MG tablet, Take 1 tablet (10 mg total) by mouth daily. If needed at night for itching not relieved by Claritin in the morning., Disp: 30 tablet, Rfl: 0 .  citalopram (CELEXA) 40 MG tablet, Take 40 mg by mouth daily., Disp: , Rfl:  .  clonazePAM (KLONOPIN) 1 MG tablet, Take 1 mg by mouth 2 (two) times daily. As needed for anxiety, Disp: , Rfl:  .  diclofenac sodium (VOLTAREN) 1 % GEL, Apply 2 g topically 4 (four) times daily., Disp: , Rfl:  .  enalapril (VASOTEC) 20 MG tablet, Take 20 mg by mouth daily., Disp: , Rfl:  .  ergocalciferol (VITAMIN D2) 50000 UNITS capsule, Take 50,000 Units by mouth every 30 (thirty) days., Disp: , Rfl:  .  fluticasone (VERAMYST) 27.5 MCG/SPRAY nasal spray, Place 2 sprays into the nose daily., Disp: , Rfl:  .  gabapentin (NEURONTIN) 300 MG capsule, Take 600 mg by mouth 3 (three) times  daily. Two tabs  to total 600 mgm by mouth 3 times daily, Disp: , Rfl:  .  HYDROcodone-acetaminophen (NORCO/VICODIN) 5-325 MG tablet, Take 1 tablet by mouth every 4 (four) hours as needed for moderate pain., Disp: , Rfl:  .  levothyroxine (SYNTHROID, LEVOTHROID) 112 MCG tablet, Take 137 mcg by mouth daily before breakfast. , Disp: , Rfl:  .  loratadine (CLARITIN) 10 MG tablet, Take 1 tablet (10 mg total) by mouth daily. Take 1 tablet in the morning. As needed for itching., Disp: 30 tablet, Rfl: 0 .  meclizine (ANTIVERT) 25 MG tablet, Take 25 mg by mouth 3 (three) times daily as needed for dizziness., Disp: , Rfl:  .  metoprolol tartrate (LOPRESSOR) 25 MG tablet, Take 12.5 mg by mouth 2 (two) times daily. , Disp: , Rfl:  .  nitroGLYCERIN (NITROSTAT) 0.4 MG SL tablet, Place 0.4 mg under the tongue every 5 (five) minutes as needed for chest pain., Disp: , Rfl:  .  omeprazole (PRILOSEC) 20 MG capsule, Take 40 mg by mouth 2 (two) times daily before a meal. , Disp: , Rfl:  .  phenazopyridine (PYRIDIUM) 95 MG tablet, Take 95 mg by mouth 3 (three) times daily as needed for pain., Disp: , Rfl:  .  pravastatin (PRAVACHOL) 40 MG tablet, Take 40 mg by mouth at bedtime., Disp: , Rfl:  .  predniSONE (DELTASONE) 10 MG tablet, Take 10 mg by mouth daily with breakfast., Disp: , Rfl:  .  ranitidine (ZANTAC) 150 MG capsule, Take 1 capsule (150 mg total) by mouth 2 (two) times daily., Disp: 30 capsule, Rfl: 0 .  risperiDONE (RISPERDAL) 0.25 MG tablet, Take 0.25 mg by mouth at bedtime., Disp: , Rfl:  .  sulfamethoxazole-trimethoprim (BACTRIM DS,SEPTRA DS) 800-160 MG tablet, Take 1 tablet by mouth 2 (two) times daily. (Patient not taking: Reported on 06/01/2017), Disp: 20 tablet, Rfl: 0  ROS  Constitutional: Denies any fever or chills Gastrointestinal: No reported hemesis, hematochezia, vomiting, or acute GI distress Musculoskeletal: Denies any acute onset joint swelling, redness, loss of ROM, or weakness Neurological: No reported  episodes of acute onset apraxia, aphasia, dysarthria, agnosia, amnesia, paralysis, loss of coordination, or loss of consciousness  Allergies  Stacy Allen is allergic to zithromax [azithromycin]; adhesive [tape]; codeine; lactose intolerance (gi); lodine [etodolac]; morphine and related; red dye; shellfish-derived products; and latex.  [place]  Drug: Stacy Allen  reports that she does not use drugs. Alcohol:  reports that she does not drink alcohol. Tobacco:  reports that she has quit smoking. she has never used smokeless tobacco. Medical:  has a past medical history of Adenomatous polyps, Allergic state, Anemia, Anginal pain ([place]), Anxiety, Arthritis, Broken rib, Cancer ([place]), Chicken pox, Coronary artery disease, Depression, Eczema, Fibromyalgia, GERD (gastroesophageal reflux disease), Headache, Hemorrhoids, History of hiatal hernia, Hypercalcemia, Hyperlipidemia, Hypertension, Hypothyroidism, Lumbar stenosis, Obesity, Peptic ulcer disease, Shortness of breath dyspnea, Sleep apnea, Vertigo, and Vitamin D deficiency. Surgical: Stacy Allen  has a past surgical history that includes Abdominal hysterectomy; Cardiac catheterization (N/A, 04/03/2015); Back surgery (2007); Cholecystectomy; Fracture surgery (Left, 1991); Coronary angioplasty (2015); Image guided sinus surgery (Bilateral, 09/28/2015); and Esophagogastroduodenoscopy (egd) with propofol (N/A, 12/21/2016). Family: family history is not on file.  Constitutional Exam  General appearance: Well nourished, well developed, and well hydrated. In no apparent acute distress Vitals:   07/18/17 1034  BP: (!) 160/85  Pulse: (!) 56  Resp: 16  Temp: 97.6 F (36.4 C)  SpO2: 97%  Weight: 225 lb (102.1 kg)  Height:  _0  (1.6 m)   BMI Assessment: Estimated body mass index is 39.86 kg/m as calculated from the following:   Height as of this encounter: _1  (1.6 m).   Weight as of this encounter: 225 lb (102.1 kg).  BMI interpretation table: BMI level  Category Range association with higher incidence of chronic pain  <18 kg/m2 Underweight   18.5-24.9 kg/m2 Ideal body weight   25-29.9 kg/m2 Overweight Increased incidence by 20%  30-34.9 kg/m2 Obese (Class I) Increased incidence by 68%  35-39.9 kg/m2 Severe obesity (Class II) Increased incidence by 136%  >40 kg/m2 Extreme obesity (Class III) Increased incidence by 254%   BMI Readings from Last 4 Encounters:  07/18/17 39.86 kg/m  06/21/17 39.50 kg/m  06/01/17 39.86 kg/m  02/07/17 37.91 kg/m   Wt Readings from Last 4 Encounters:  07/18/17 225 lb (102.1 kg)  06/21/17 223 lb (101.2 kg)  06/01/17 225 lb (102.1 kg)  02/07/17 214 lb (97.1 kg)  Psych/Mental status: Alert, oriented x 3 (person, place, & time)       Eyes: PERLA Respiratory: No evidence of acute respiratory distress  Cervical Spine Area Exam  Skin & Axial Inspection: No masses, redness, edema, swelling, or associated skin lesions Alignment: Symmetrical Functional ROM: Unrestricted ROM      Stability: No instability detected Muscle Tone/Strength: Functionally intact. No obvious neuro-muscular anomalies detected. Sensory (Neurological): Unimpaired Palpation: No palpable anomalies              Upper Extremity (UE) Exam    Side: Right upper extremity  Side: Left upper extremity  Skin & Extremity Inspection: Skin color, temperature, and hair growth are WNL. No peripheral edema or cyanosis. No masses, redness, swelling, asymmetry, or associated skin lesions. No contractures.  Skin & Extremity Inspection: Skin color, temperature, and hair growth are WNL. No peripheral edema or cyanosis. No masses, redness, swelling, asymmetry, or associated skin lesions. No contractures.  Functional ROM: Unrestricted ROM          Functional ROM: Unrestricted ROM          Muscle Tone/Strength: Functionally intact. No obvious neuro-muscular anomalies detected.  Muscle Tone/Strength: Functionally intact. No obvious neuro-muscular anomalies  detected.  Sensory (Neurological): Unimpaired          Sensory (Neurological): Unimpaired          Palpation: No palpable anomalies              Palpation: No palpable anomalies              Specialized Test(s): Deferred         Specialized Test(s): Deferred          Thoracic Spine Area Exam  Skin & Axial Inspection: No masses, redness, or swelling Alignment: Symmetrical Functional ROM: Unrestricted ROM Stability: No instability detected Muscle Tone/Strength: Functionally intact. No obvious neuro-muscular anomalies detected. Sensory (Neurological): Unimpaired Muscle strength & Tone: No palpable anomalies  Lumbar Spine Area Exam  Skin & Axial Inspection: No masses, redness, or swelling Alignment: Symmetrical Functional ROM: Unrestricted ROM      Stability: No instability detected Muscle Tone/Strength: Functionally intact. No obvious neuro-muscular anomalies detected. Sensory (Neurological): Unimpaired Palpation: No palpable anomalies       Provocative Tests: Lumbar Hyperextension and rotation test: Positive bilaterally for facet joint pain.however significantly improved after treatment Lumbar Lateral bending test: evaluation deferred today       Patrick's Maneuver: Positive for bilateral S-I arthralgia improved after treatment  Gait & Posture Assessment  Ambulation: Unassisted Gait: Relatively normal for age and body habitus Posture: WNL   Lower Extremity Exam    Side: Right lower extremity  Side: Left lower extremity  Skin & Extremity Inspection: Skin color, temperature, and hair growth are WNL. No peripheral edema or cyanosis. No masses, redness, swelling, asymmetry, or associated skin lesions. No contractures.  Skin & Extremity Inspection: Skin color, temperature, and hair growth are WNL. No peripheral edema or cyanosis. No masses, redness, swelling, asymmetry, or associated skin lesions. No contractures.  Functional ROM: Unrestricted ROM          Functional ROM:  Unrestricted ROM          Muscle Tone/Strength: Functionally intact. No obvious neuro-muscular anomalies detected.  Muscle Tone/Strength: Functionally intact. No obvious neuro-muscular anomalies detected.  Sensory (Neurological): Unimpaired  Sensory (Neurological): Unimpaired  Palpation: No palpable anomalies  Palpation: No palpable anomalies   Assessment  Primary Diagnosis & Pertinent Problem List: The primary encounter diagnosis was SI joint arthritis ([place]). Diagnoses of Lumbar spondylosis, Lumbar degenerative disc disease, and Chronic pain syndrome were also pertinent to this visit.  Status Diagnosis  Responding Responding Responding 1. SI joint arthritis ([place])   2. Lumbar spondylosis   3. Lumbar degenerative disc disease   4. Chronic pain syndrome      77 year old female who presents with axial low back pain that radiates into bilateral buttocks and occasionally bilateral feet.  Patient has a history of a transforaminal lumbar interbody fusion from L2-L4 in 2007.  Patient also has moderate stenosis and degeneration at L1-L2 along with L5-S1.  This is to be expected given that these are the adjacent levels next to her fusion.  Patient does have severe limitation with lumbar extension and lateral rotation.  Patient also has pain overlying her PSIS and intermittent groin pain which could be secondary to SI joint arthralgia.  Patient does have positive Patrick's bilaterally.  Patient returns for follow-up status post bilateral L1/2, L5/S1 facet steroid injection along with bilateral SI joint injections.   Patient notes approximately 70% pain relief after the procedure but states that in the last week she has had a return of her right groin pain which is very severe.  She is still endorsing benefit in regards to her axial low back pain.  The day after the procedure, patient did have a mechanical fall which resulted in a rib fracture as she tripped and hit her side mirror.  Otherwise the patient  has had any injections since her last visit.  She is pleased with the relief that she got from her injections and is interested in repeating.  We also discussed potential radiofrequency ablation at these levels if she continues to get significant albeit short-term relief.   Plan: -Repeat bilateral L5-S1 facet steroid injection for lumbar spondylosis, below level of hardware and fusion -Repeat bilateral SI joint injection, for SI joint arthritis  Lab-work, procedure(s), and/or referral(s): Orders Placed This Encounter  Procedures  . SACROILIAC JOINT INJECTION  . LUMBAR FACET(MEDIAL BRANCH NERVE BLOCK) MBNB   Time Note: Greater than 50% of the 25 minute(s) of face-to-face time spent with Stacy Allen, was spent in counseling/coordination of care regarding: the treatment plan, treatment alternatives, the risks and possible complications of proposed treatment, the results, interpretation and significance of  her recent diagnostic interventional treatment(s) and the need to bring and keep the BMI below 30.  Provider-requested follow-up: Return in about 2 weeks (around 07/31/2017) for Procedure.  No future appointments.  Primary Care Physician: Frazier Richards, MD Location: [facility] Note by: Gillis Santa, M.D Date: 07/18/2017; Time: 11:10 AM  There are no Patient Instructions on file for this visit.

## 2017-07-18 NOTE — Patient Instructions (Signed)
Sacroiliac (SI) Joint Injection Patient Information  Description: The sacroiliac joint connects the scrum (very low back and tailbone) to the ilium (a pelvic bone which also forms half of the hip joint).  Normally this joint experiences very little motion.  When this joint becomes inflamed or unstable low back and or hip and pelvis pain may result.  Injection of this joint with local anesthetics (numbing medicines) and steroids can provide diagnostic information and reduce pain.  This injection is performed with the aid of x-ray guidance into the tailbone area while you are lying on your stomach.   You may experience an electrical sensation down the leg while this is being done.  You may also experience numbness.  We also may ask if we are reproducing your normal pain during the injection.  Conditions which may be treated SI injection:   Low back, buttock, hip or leg pain  Preparation for the Injection:  1. Do not eat any solid food or dairy products within 8 hours of your appointment.  2. You may drink clear liquids up to 3 hours before appointment.  Clear liquids include water, black coffee, juice or soda.  No milk or cream please. 3. You may take your regular medications, including pain medications with a sip of water before your appointment.  Diabetics should hold regular insulin (if take separately) and take 1/2 normal NPH dose the morning of the procedure.  Carry some sugar containing items with you to your appointment. 4. A driver must accompany you and be prepared to drive you home after your procedure. 5. Bring all of your current medications with you. 6. An IV may be inserted and sedation may be given at the discretion of the physician. 7. A blood pressure cuff, EKG and other monitors will often be applied during the procedure.  Some patients may need to have extra oxygen administered for a short period.  8. You will be asked to provide medical information, including your allergies,  prior to the procedure.  We must know immediately if you are taking blood thinners (like Coumadin/Warfarin) or if you are allergic to IV iodine contrast (dye).  We must know if you could possible be pregnant.  Possible side effects:   Bleeding from needle site  Infection (rare, may require surgery)  Nerve injury (rare)  Numbness & tingling (temporary)  A brief convulsion or seizure  Light-headedness (temporary)  Pain at injection site (several days)  Decreased blood pressure (temporary)  Weakness in the leg (temporary)   Call if you experience:   New onset weakness or numbness of an extremity below the injection site that last more than 8 hours.  Hives or difficulty breathing ( go to the emergency room)  Inflammation or drainage at the injection site  Any new symptoms which are concerning to you  Please note:  Although the local anesthetic injected can often make your back/ hip/ buttock/ leg feel good for several hours after the injections, the pain will likely return.  It takes 3-7 days for steroids to work in the sacroiliac area.  You may not notice any pain relief for at least that one week.  If effective, we will often do a series of three injections spaced 3-6 weeks apart to maximally decrease your pain.  After the initial series, we generally will wait some months before a repeat injection of the same type.  If you have any questions, please call [phone] [place]  The facet joints connect the bones of the spine (vertebrae). They make it possible for you to bend, twist, and make other movements with your spine. They also keep you from bending too far, twisting too far, and making other excessive movements. A facet joint block is a procedure where a numbing medicine (anesthetic) is injected into a facet joint. Often, a type of anti-inflammatory medicine called a steroid is also injected. A facet joint block  may be done to diagnose neck or back pain. If the pain gets better after a facet joint block, it means the pain is probably coming from the facet joint. If the pain does not get better, it means the pain is probably not coming from the facet joint. A facet joint block may also be done to relieve neck or back pain caused by an inflamed facet joint. A facet joint block is only done to relieve pain if the pain does not improve with other methods, such as medicine, exercise programs, and physical therapy. Tell a health care provider about:  Any allergies you have.  All medicines you are taking, including vitamins, herbs, eye drops, creams, and over-the-counter medicines.  Any problems you or family members have had with anesthetic medicines.  Any blood disorders you have.  Any surgeries you have had.  Any medical conditions you have.  Whether you are pregnant or may be pregnant. What are the risks? Generally, this is a safe procedure. However, problems may occur, including:  Bleeding.  Injury to a nerve near the injection site.  Pain at the injection site.  Weakness or numbness in areas controlled by nerves near the injection site.  Infection.  Temporary fluid retention.  Allergic reactions to medicines or dyes.  Injury to other structures or organs near the injection site.  What happens before the procedure?  Follow instructions from your health care provider about eating or drinking restrictions.  Ask your health care provider about: ? Changing or stopping your regular medicines. This is especially important if you are taking diabetes medicines or blood thinners. ? Taking medicines such as aspirin and ibuprofen. These medicines can thin your blood. Do not take these medicines before your procedure if your health care provider instructs you not to.  Do not take any new dietary supplements or medicines without asking your health care provider first.  Plan to have someone take  you home after the procedure. What happens during the procedure?  You may need to remove your clothing and dress in an open-back gown.  The procedure will be done while you are lying on an X-ray table. You will most likely be asked to lie on your stomach, but you may be asked to lie in a different position if an injection will be made in your neck.  Machines will be used to monitor your oxygen levels, heart rate, and blood pressure.  If an injection will be made in your neck, an IV tube will be inserted into one of your veins. Fluids and medicine will flow directly into your body through the IV tube.  The area over the facet joint where the injection will be made will be cleaned with soap. The surrounding skin will be covered with clean drapes.  A numbing medicine (local anesthetic) will be applied to your skin. Your skin may sting or burn for a moment.  A video X-ray machine (fluoroscopy) will be used to locate the joint. In some cases, a CT scan may be used.  A contrast dye  may be injected into the facet joint area to help locate the joint.  When the joint is located, an anesthetic will be injected into the joint through the needle.  Your health care provider will ask you whether you feel pain relief. If you do feel relief, a steroid may be injected to provide pain relief for a longer period of time. If you do not feel relief or feel only partial relief, additional injections of an anesthetic may be made in other facet joints.  The needle will be removed.  Your skin will be cleaned.  A bandage (dressing) will be applied over each injection site. The procedure may vary among health care providers and hospitals. What happens after the procedure?  You will be observed for 15-30 minutes before being allowed to go home. This information is not intended to replace advice given to you by your health care provider. Make sure you discuss any questions you have with your health care  provider. Document Released: 11/02/2006 Document Revised: 07/15/2015 Document Reviewed: 03/09/2015 [employer]vier Interactive Patient Education  [employer].

## 2017-08-02 ENCOUNTER — Ambulatory Visit (HOSPITAL_BASED_OUTPATIENT_CLINIC_OR_DEPARTMENT_OTHER): Payer: Medicare HMO | Admitting: Student in an Organized Health Care Education/Training Program

## 2017-08-02 ENCOUNTER — Ambulatory Visit
Admission: RE | Admit: 2017-08-02 | Discharge: 2017-08-02 | Disposition: A | Discharge status: Home / Self Care | Payer: Medicare HMO | Source: Ambulatory Visit | Attending: Student in an Organized Health Care Education/Training Program | Admitting: Student in an Organized Health Care Education/Training Program

## 2017-08-02 ENCOUNTER — Encounter: Payer: Self-pay | Admitting: Student in an Organized Health Care Education/Training Program

## 2017-08-02 VITALS — BP 153/101 | HR 62 | Temp 98.3°F | Resp 20 | Ht 63.0 in | Wt 225.0 lb

## 2017-08-02 DIAGNOSIS — Z885 Allergy status to narcotic agent status: Secondary | ICD-10-CM | POA: Insufficient documentation

## 2017-08-02 DIAGNOSIS — M545 Low back pain: Secondary | ICD-10-CM | POA: Diagnosis present

## 2017-08-02 DIAGNOSIS — G894 Chronic pain syndrome: Secondary | ICD-10-CM | POA: Diagnosis not present

## 2017-08-02 DIAGNOSIS — M47816 Spondylosis without myelopathy or radiculopathy, lumbar region: Secondary | ICD-10-CM | POA: Insufficient documentation

## 2017-08-02 DIAGNOSIS — Z9049 Acquired absence of other specified parts of digestive tract: Secondary | ICD-10-CM | POA: Insufficient documentation

## 2017-08-02 DIAGNOSIS — M533 Sacrococcygeal disorders, not elsewhere classified: Secondary | ICD-10-CM | POA: Diagnosis not present

## 2017-08-02 DIAGNOSIS — Z888 Allergy status to other drugs, medicaments and biological substances status: Secondary | ICD-10-CM | POA: Insufficient documentation

## 2017-08-02 DIAGNOSIS — Z881 Allergy status to other antibiotic agents status: Secondary | ICD-10-CM | POA: Diagnosis not present

## 2017-08-02 DIAGNOSIS — M461 Sacroiliitis, not elsewhere classified: Secondary | ICD-10-CM

## 2017-08-02 DIAGNOSIS — M4698 Unspecified inflammatory spondylopathy, sacral and sacrococcygeal region: Secondary | ICD-10-CM | POA: Diagnosis not present

## 2017-08-02 DIAGNOSIS — R1030 Lower abdominal pain, unspecified: Secondary | ICD-10-CM | POA: Insufficient documentation

## 2017-08-02 DIAGNOSIS — M47818 Spondylosis without myelopathy or radiculopathy, sacral and sacrococcygeal region: Secondary | ICD-10-CM

## 2017-08-02 DIAGNOSIS — M25551 Pain in right hip: Secondary | ICD-10-CM | POA: Diagnosis present

## 2017-08-02 MED ORDER — ROPIVACAINE HCL 2 MG/ML IJ SOLN
10.0000 mL | Freq: Once | INTRAMUSCULAR | Status: AC
  Administered 2017-08-02: 10 mL
  Filled 2017-08-02: qty 10

## 2017-08-02 MED ORDER — DEXAMETHASONE SODIUM PHOSPHATE 10 MG/ML IJ SOLN
INTRAMUSCULAR | Status: AC
  Filled 2017-08-02: qty 1

## 2017-08-02 MED ORDER — LIDOCAINE HCL (PF) 1 % IJ SOLN
10.0000 mL | Freq: Once | INTRAMUSCULAR | Status: AC
  Administered 2017-08-02: 5 mL
  Filled 2017-08-02: qty 10

## 2017-08-02 MED ORDER — DEXAMETHASONE SODIUM PHOSPHATE 10 MG/ML IJ SOLN
10.0000 mg | Freq: Once | INTRAMUSCULAR | Status: AC
  Administered 2017-08-02: 10 mg

## 2017-08-02 NOTE — Patient Instructions (Signed)
____________________________________________________________________________________________  Post-Procedure instructions Instructions:  Apply ice: Fill a plastic sandwich bag with crushed ice. Cover it with a small towel and apply to injection site. Apply for 15 minutes then remove x 15 minutes. Repeat sequence on day of procedure, until you go to bed. The purpose is to minimize swelling and discomfort after procedure.  Apply heat: Apply heat to procedure site starting the day following the procedure. The purpose is to treat any soreness and discomfort from the procedure.  Food intake: Start with clear liquids (like water) and advance to regular food, as tolerated.   Physical activities: Keep activities to a minimum for the first 8 hours after the procedure.   Driving: If you have received any sedation, you are not allowed to drive for 24 hours after your procedure.  Blood thinner: Restart your blood thinner 6 hours after your procedure. (Only for those taking blood thinners)  Insulin: As soon as you can eat, you may resume your normal dosing schedule. (Only for those taking insulin)  Infection prevention: Keep procedure site clean and dry.  Post-procedure Pain Diary: Extremely important that this be done correctly and accurately. Recorded information will be used to determine the next step in treatment.  Pain evaluated is that of treated area only. Do not include pain from an untreated area.  Complete every hour, on the hour, for the initial 8 hours. Set an alarm to help you do this part accurately.  Do not go to sleep and have it completed later. It will not be accurate.  Follow-up appointment: Keep your follow-up appointment after the procedure. Usually 2 weeks for most procedures. (6 weeks in the case of radiofrequency.) Bring you pain diary.  Expect:  From numbing medicine (AKA: Local Anesthetics): Numbness or decrease in pain.  Onset: Full effect within 15 minutes of  injected.  Duration: It will depend on the type of local anesthetic used. On the average, 1 to 8 hours.   From steroids: Decrease in swelling or inflammation. Once inflammation is improved, relief of the pain will follow.  Onset of benefits: Depends on the amount of swelling present. The more swelling, the longer it will take for the benefits to be seen. In some cases, up to 10 days.  Duration: Steroids will stay in the system x 2 weeks. Duration of benefits will depend on multiple posibilities including persistent irritating factors.  From procedure: Some discomfort is to be expected once the numbing medicine wears off. This should be minimal if ice and heat are applied as instructed. Call if:  You experience numbness and weakness that gets worse with time, as opposed to wearing off.  New onset bowel or bladder incontinence. (Spinal procedures only)  Emergency Numbers:  Durning business hours (Monday - Thursday, 8:00 AM - 4:00 PM) (Friday, 9:00 AM - 12:00 Noon): [phone]  After hours: [phone] ____________________________________________________________________________________________   Selective Nerve Root Block Patient Information  Description: Specific nerve roots exit the spinal canal and these nerves can be compressed and inflamed by a bulging disc and bone spurs.  By injecting steroids on the nerve root, we can potentially decrease the inflammation surrounding these nerves, which often leads to decreased pain.  Also, by injecting local anesthesia on the nerve root, this can provide us helpful information to give to your referring doctor if it decreases your pain.  Selective nerve root blocks can be done along the spine from the neck to the low back depending on the location of your pain.     After numbing the skin with local anesthesia, a small needle is passed to the nerve root and the position of the needle is verified using x-ray pictures.  After the needle is in  correct position, we then deposit the medication.  You may experience a pressure sensation while this is being done.  The entire block usually lasts less than 15 minutes.  Conditions that may be treated with selective nerve root blocks:  Low back and leg pain  Spinal stenosis  Diagnostic block prior to potential surgery  Neck and arm pain  Post laminectomy syndrome  Preparation for the injection:  1. Do not eat any solid food or dairy products within 8 hours of your appointment. 2. You may drink clear liquids up to 3 hours before an appointment.  Clear liquids include water, black coffee, juice or soda.  No milk or cream please. 3. You may take your regular medications, including pain medications, with a sip of water before your appointment.  Diabetics should hold regular insulin (if taken separately) and take 1/2 normal NPH dose the morning of the procedure.  Carry some sugar containing items with you to your appointment. 4. A driver must accompany you and be prepared to drive you home after your procedure. 5. Bring all your current medications with you. 6. An IV may be inserted and sedation may be given at the discretion of the physician. 7. A blood pressure cuff, EKG, and other monitors will often be applied during the procedure.  Some patients may need to have extra oxygen administered for a short period. 8. You will be asked to provide medical information, including allergies, prior to the procedure.  We must know immediately if you are taking blood  Thinners (like Coumadin) or if you are allergic to IV iodine contrast (dye).  Possible side-effects: All are usually temporary  Bleeding from needle site  Light headedness  Numbness and tingling  Decreased blood pressure  Weakness in arms/legs  Pressure sensation in back/neck  Pain at injection site (several days)  Possible complications: All are extremely rare  Infection  Nerve injury  Spinal headache (a headache  wore with upright position)  Call if you experience:  Fever/chills associated with headache or increased back/neck pain  Headache worsened by an upright position  New onset weakness or numbness of an extremity below the injection site  Hives or difficulty breathing (go to the emergency room)  Inflammation or drainage at the injection site(s)  Severe back/neck pain greater than usual  New symptoms which are concerning to you  Please note:  Although the local anesthetic injected can often make your back or neck feel good for several hours after the injection the pain will likely return.  It takes 3-5 days for steroids to work on the nerve root. You may not notice any pain relief for at least one week.  If effective, we will often do a series of 3 injections spaced 3-6 weeks apart to maximally decrease your pain.    If you have any questions, please call [phone] [facility] 

## 2017-08-02 NOTE — Progress Notes (Signed)
Safety precautions to be maintained throughout the outpatient stay will include: orient to surroundings, keep bed in low position, maintain call bell within reach at all times, provide assistance with transfer out of bed and ambulation.  

## 2017-08-02 NOTE — Progress Notes (Signed)
[name] Name: Stacy Allen  MRN: [id number]  Referring Provider: Gillis Santa, MD  DOB: 1941-02-06  PCP: Frazier Richards, MD  DOS: 08/02/2017  Note by: Gillis Santa, MD  Service setting: Ambulatory outpatient  Specialty: Interventional Pain Management  Patient type: Established  Location: [facility] (AMB) Pain Management Facility  Visit type: Interventional Procedure   Primary Reason for Visit: Interventional Pain Management Treatment. CC: Back Pain (lower bilateral); Hip Pain (right outer aspect); and Groin Pain (right)  Procedure:  Anesthesia, Analgesia, Anxiolysis:  Procedure #1: Type: Therapeutic Medial Branch Facet Block #2  Region: Lumbar Level:  L5 Medial Branch Level Laterality: Bilateral  Procedure #2: Type: Therapeutic Sacroiliac Joint Block #2  Region: Posterior Lumbosacral Level: PIIS (Posterior Inferior Iliac Spine) Sacroiliac Joint Laterality: Bilateral  Type: Local Anesthesia Local Anesthetic: Lidocaine 1% Route: Infiltration ([place]/IM) IV Access: Declined Sedation: Meaningful verbal contact was maintained at all times during the procedure  Indication(s): Analgesia and Anxiety   Indications: 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 Lumbar spondylosis   3. Chronic pain syndrome    Pain Score: Pre-procedure: 2 /10 Post-procedure: 0-No pain/10  Pre-op Assessment:  Stacy Allen is a 77 y.o. (year old), female patient, seen today for interventional treatment. She  has a past surgical history that includes Abdominal hysterectomy; Cardiac catheterization (N/A, 04/03/2015); Back surgery (2007); Cholecystectomy; Fracture surgery (Left, 1991); Coronary angioplasty (2015); Image guided sinus surgery (Bilateral, 09/28/2015); and Esophagogastroduodenoscopy (egd) with propofol (N/A, 12/21/2016). Stacy Allen has a current medication list which includes the following prescription(s): acetaminophen, albuterol, amlodipine, amoxicillin, celecoxib, cetirizine, citalopram, clonazepam, diclofenac sodium,  enalapril, ergocalciferol, fluticasone, gabapentin, hydrocodone-acetaminophen, levothyroxine, loratadine, meclizine, metoprolol tartrate, nitroglycerin, omeprazole, phenazopyridine, pseudoephedrine, pravastatin, prednisone, ranitidine, risperidone, and sulfamethoxazole-trimethoprim. Her primarily concern today is the Back Pain (lower bilateral); Hip Pain (right outer aspect); and Groin Pain (right)  Initial Vital Signs:  Pulse Rate: 62 Temp: 98.3 F (36.8 C) Resp: 16 BP: (!) 158/84 SpO2: 98 %  BMI: Estimated body mass index is 39.86 kg/m as calculated from the following:   Height as of this encounter: 5\' 3"  (1.6 m).   Weight as of this encounter: 225 lb (102.1 kg).  Risk Assessment: Allergies: Reviewed. She is allergic to zithromax [azithromycin]; adhesive [tape]; codeine; lactose intolerance (gi); lodine [etodolac]; morphine and related; red dye; shellfish-derived products; and latex.  Allergy Precautions: None required Coagulopathies: Reviewed. None identified.  Blood-thinner therapy: None at this time Active Infection(s): Reviewed. None identified. Stacy Allen is afebrile  Site Confirmation: Stacy Allen was asked to confirm the procedure and laterality before marking the site Procedure checklist: Completed Consent: Before the procedure and under the influence of no sedative(s), amnesic(s), or anxiolytics, the patient was informed of the treatment options, risks and possible complications. To fulfill our ethical and legal obligations, as recommended by the American Medical Association's Code of Ethics, I have informed the patient of my clinical impression; the nature and purpose of the treatment or procedure; the risks, benefits, and possible complications of the intervention; the alternatives, including doing nothing; the risk(s) and benefit(s) of the alternative treatment(s) or procedure(s); and the risk(s) and benefit(s) of doing nothing. The patient was provided information about the  general risks and possible complications associated with the procedure. These may include, but are not limited to: failure to achieve desired goals, infection, bleeding, organ or nerve damage, allergic reactions, paralysis, and death. In addition, the patient was informed of those risks and complications associated to Spine-related procedures, such as failure to decrease pain; infection (i.e.:  Meningitis, epidural or intraspinal abscess); bleeding (i.e.: epidural hematoma, subarachnoid hemorrhage, or any other type of intraspinal or peri-dural bleeding); organ or nerve damage (i.e.: Any type of peripheral nerve, nerve root, or spinal cord injury) with subsequent damage to sensory, motor, and/or autonomic systems, resulting in permanent pain, numbness, and/or weakness of one or several areas of the body; allergic reactions; (i.e.: anaphylactic reaction); and/or death. Furthermore, the patient was informed of those risks and complications associated with the medications. These include, but are not limited to: allergic reactions (i.e.: anaphylactic or anaphylactoid reaction(s)); adrenal axis suppression; blood sugar elevation that in diabetics may result in ketoacidosis or comma; water retention that in patients with history of congestive heart failure may result in shortness of breath, pulmonary edema, and decompensation with resultant heart failure; weight gain; swelling or edema; medication-induced neural toxicity; particulate matter embolism and blood vessel occlusion with resultant organ, and/or nervous system infarction; and/or aseptic necrosis of one or more joints. Finally, the patient was informed that Medicine is not an exact science; therefore, there is also the possibility of unforeseen or unpredictable risks and/or possible complications that may result in a catastrophic outcome. The patient indicated having understood very clearly. We have given the patient no guarantees and we have made no promises.  Enough time was given to the patient to ask questions, all of which were answered to the patient's satisfaction. Stacy Allen has indicated that she wanted to continue with the procedure. Attestation: I, the ordering provider, attest that I have discussed with the patient the benefits, risks, side-effects, alternatives, likelihood of achieving goals, and potential problems during recovery for the procedure that I have provided informed consent. Date: 08/02/2017  Time: N/A  Pre-Procedure Preparation:  Monitoring: As per clinic protocol. Respiration, ETCO2, SpO2, BP, heart rate and rhythm monitor placed and checked for adequate function Safety Precautions: Patient was assessed for positional comfort and pressure points before starting the procedure. Time-out: I initiated and conducted the "Time-out" before starting the procedure, as per protocol. The patient was asked to participate by confirming the accuracy of the "Time Out" information. Verification of the correct person, site, and procedure were performed and confirmed by me, the nursing staff, and the patient. "Time-out" conducted as per Joint Commission's Universal Protocol (UP.01.01.01). "Time-out" Date & Time: 08/02/2017; 0937 hrs.  Description of Procedure #1 Process:   Time-out: "Time-out" completed before starting procedure, as per protocol. Position: Prone Target Area: For Lumbar Facet blocks, the target is the groove formed by the junction of the transverse process and superior articular process. For the L5 dorsal ramus, the target is the notch between superior articular process and sacral ala. Approach: Paramedial approach. Area Prepped: Entire Posterior Lumbosacral Region Prepping solution: ChloraPrep (2% chlorhexidine gluconate and 70% isopropyl alcohol) Safety Precautions: Aspiration looking for blood return was conducted prior to all injections. At no point did we inject any substances, as a needle was being advanced. No attempts were  made at seeking any paresthesias. Safe injection practices and needle disposal techniques used. Medications properly checked for expiration dates. SDV (single dose vial) medications used.  Description of the Procedure: Protocol guidelines were followed. The patient was placed in position over the fluoroscopy table. The target area was identified and the area prepped in the usual manner. Skin desensitized using vapocoolant spray. Skin & deeper tissues infiltrated with local anesthetic. Appropriate amount of time allowed to pass for local anesthetics to take effect. The procedure needle was introduced through the skin, ipsilateral to the reported pain,  and advanced to the target area. Employing the "Medial Branch Technique", the needles were advanced to the angle made by the superior and medial portion of the transverse process, and the lateral and inferior portion of the superior articulating process of the targeted vertebral bodies. This area is known as "[place]'s Eye" or the "Eye of the [place] Dog". A procedure needle was introduced through the skin, and this time advanced to the angle made by the superior and medial border of the sacral ala, and the lateral border of the S1 vertebral body. This last needle was later repositioned at the superior and lateral border of the posterior S1 foramen. Negative aspiration confirmed. Solution injected in intermittent fashion, asking for systemic symptoms every 0.5cc of injectate. The needles were then removed and the area cleansed, making sure to leave some of the prepping solution back to take advantage of its long term bactericidal properties. Start Time: 0937 hrs. Materials:  Needle(s) Type: Regular needle Gauge: 22G Length: 3.5-in Medication(s): Please see orders for medications and dosing details. 10 cc solution made of 9 cc of 0.2% ropivacaine, 1 cc of Decadron 10 mg/cc.  1 cc injected at each level. Description of Procedure # 2 Process:   Position:  Prone Target Area: For lower sacroiliac joint block(s), the target is the inferior and posterior margin of the sacroiliac joint. Approach: Ipsilateral approach. Area Prepped: Entire Posterior Lumbosacral Region Prepping solution: ChloraPrep (2% chlorhexidine gluconate and 70% isopropyl alcohol) Safety Precautions: Aspiration looking for blood return was conducted prior to all injections. At no point did we inject any substances, as a needle was being advanced. No attempts were made at seeking any paresthesias. Safe injection practices and needle disposal techniques used. Medications properly checked for expiration dates. SDV (single dose vial) medications used. Description of the Procedure: Protocol guidelines were followed. The patient was placed in position over the fluoroscopy table. The target area was identified and the area prepped in the usual manner. Skin desensitized using vapocoolant spray. Skin & deeper tissues infiltrated with local anesthetic. Appropriate amount of time allowed to pass for local anesthetics to take effect. The procedure needle was advanced under fluoroscopic guidance into the sacroiliac joint until a firm endpoint was obtained. Proper needle placement secured. Negative aspiration confirmed. Solution injected in intermittent fashion, asking for systemic symptoms every 0.5cc of injectate. The needles were then removed and the area cleansed, making sure to leave some of the prepping solution back to take advantage of its long term bactericidal properties. Vitals:   08/02/17 0936 08/02/17 0941 08/02/17 0947 08/02/17 0952  BP: (!) 152/90 (!) 153/90 (!) 154/78 (!) 153/101  Pulse:      Resp: 16 16 16 20   Temp:      TempSrc:      SpO2: 97% 96% 95% 99%  Weight:      Height:        End Time: 0952 hrs. Materials:  Needle(s) Type: Regular needle Gauge: 22G Length: 3.5-in Medication(s): Please see orders for medications and dosing details. 10 cc solution made of 9 cc 0.2%  ropivacaine, 1 cc of Decadron 10 mg/cc.  2 cc injected intra-articular, 2 cc injected periarticular in each SI joint.   Imaging Guidance (Spinal):  Type of Imaging Technique: Fluoroscopy Guidance (Spinal) Indication(s): Assistance in needle guidance and placement for procedures requiring needle placement in or near specific anatomical locations not easily accessible without such assistance. Exposure Time: Please see nurses notes. Contrast: None used. Fluoroscopic Guidance: I was personally present during the  use of fluoroscopy. "Tunnel Vision Technique" used to obtain the best possible view of the target area. Parallax error corrected before commencing the procedure. "Direction-depth-direction" technique used to introduce the needle under continuous pulsed fluoroscopy. Once target was reached, antero-posterior, oblique, and lateral fluoroscopic projection used confirm needle placement in all planes. Images permanently stored in EMR. Interpretation: No contrast injected. I personally interpreted the imaging intraoperatively. Adequate needle placement confirmed in multiple planes. Permanent images saved into the patient's record.  Antibiotic Prophylaxis:   Antibiotics Given (last 72 hours)    None     Indication(s): None identified  Post-operative Assessment:  Post-procedure Vital Signs:  Pulse Rate: 62 Temp: 98.3 F (36.8 C) Resp: 20 BP: (!) 153/101 SpO2: 99 %  EBL: None  Complications: No immediate post-treatment complications observed by team, or reported by patient.  Note: The patient tolerated the entire procedure well. A repeat set of vitals were taken after the procedure and the patient was kept under observation following institutional policy, for this type of procedure. Post-procedural neurological assessment was performed, showing return to baseline, prior to discharge. The patient was provided with post-procedure discharge instructions, including a section on how to identify  potential problems. Should any problems arise concerning this procedure, the patient was given instructions to immediately contact us, at any time, without hesitation. In any case, we plan to contact the patient by telephone for a follow-up status report regarding this interventional procedure.  Comments:  No additional relevant information. 5 out of 5 strength bilateral lower extremity: Plantar flexion, dorsiflexion, knee flexion, knee extension.  Plan of Care    Imaging Orders     DG C-Arm 1-60 Min-No Report Procedure Orders    No procedure(s) ordered today    Medications ordered for procedure: Meds ordered this encounter  Medications  . lidocaine (PF) (XYLOCAINE) 1 % injection 10 mL  . ropivacaine (PF) 2 mg/mL (0.2%) (NAROPIN) injection 10 mL  . dexamethasone (DECADRON) injection 10 mg   Medications administered: We administered lidocaine (PF), ropivacaine (PF) 2 mg/mL (0.2%), and dexamethasone.  See the medical record for exact dosing, route, and time of administration.  New Prescriptions   No medications on file   Disposition: Discharge home  Discharge Date & Time: 08/02/2017; 0956 hrs.   Physician-requested Follow-up: Return in about 4 weeks (around 08/30/2017) for Post Procedure Evaluation.  Future Appointments  Date Time Provider [place]  08/30/2017  9:15 AM Gillis Santa, MD [facility] None   Primary Care Physician: Frazier Richards, MD Location: [facility] Note by: Gillis Santa, MD Date: 08/02/2017; Time: 11:41 AM  Disclaimer:  Medicine is not an exact science. The only guarantee in medicine is that nothing is guaranteed. It is important to note that the decision to proceed with this intervention was based on the information collected from the patient. The Data and conclusions were drawn from the patient's questionnaire, the interview, and the physical examination. Because the information was provided in large part by the patient, it  cannot be guaranteed that it has not been purposely or unconsciously manipulated. Every effort has been made to obtain as much relevant data as possible for this evaluation. It is important to note that the conclusions that lead to this procedure are derived in large part from the available data. Always take into account that the treatment will also be dependent on availability of resources and existing treatment guidelines, considered by other Pain Management Practitioners as being common knowledge and practice, at the time  of the intervention. For Medico-Legal purposes, it is also important to point out that variation in procedural techniques and pharmacological choices are the acceptable norm. The indications, contraindications, technique, and results of the above procedure should only be interpreted and judged by a Board-Certified Interventional Pain Specialist with extensive familiarity and expertise in the same exact procedure and technique.

## 2017-08-03 ENCOUNTER — Telehealth: Payer: Self-pay

## 2017-08-03 NOTE — Telephone Encounter (Signed)
Post procedure phone call.  Left message.  

## 2017-08-11 ENCOUNTER — Telehealth: Payer: Self-pay | Admitting: Student in an Organized Health Care Education/Training Program

## 2017-08-11 NOTE — Telephone Encounter (Signed)
Patient having groin pain and would like to speak with Nurse about this. Please call.

## 2017-08-15 NOTE — Telephone Encounter (Signed)
Left voicemail with patient that I will need additional medication.

## 2017-08-18 ENCOUNTER — Telehealth: Payer: Self-pay

## 2017-08-18 NOTE — Telephone Encounter (Signed)
Left message for patient to call.

## 2017-08-21 ENCOUNTER — Telehealth: Payer: Self-pay | Admitting: *Deleted

## 2017-08-30 ENCOUNTER — Encounter: Payer: Self-pay | Admitting: Student in an Organized Health Care Education/Training Program

## 2017-08-30 ENCOUNTER — Other Ambulatory Visit: Payer: Self-pay

## 2017-08-30 ENCOUNTER — Ambulatory Visit
Payer: Medicare HMO | Attending: Student in an Organized Health Care Education/Training Program | Admitting: Student in an Organized Health Care Education/Training Program

## 2017-08-30 VITALS — BP 163/77 | HR 64 | Temp 97.7°F | Ht 63.0 in | Wt 226.0 lb

## 2017-08-30 DIAGNOSIS — E559 Vitamin D deficiency, unspecified: Secondary | ICD-10-CM | POA: Diagnosis not present

## 2017-08-30 DIAGNOSIS — Z79891 Long term (current) use of opiate analgesic: Secondary | ICD-10-CM | POA: Diagnosis not present

## 2017-08-30 DIAGNOSIS — Z881 Allergy status to other antibiotic agents status: Secondary | ICD-10-CM | POA: Insufficient documentation

## 2017-08-30 DIAGNOSIS — Z87891 Personal history of nicotine dependence: Secondary | ICD-10-CM | POA: Diagnosis not present

## 2017-08-30 DIAGNOSIS — G894 Chronic pain syndrome: Secondary | ICD-10-CM

## 2017-08-30 DIAGNOSIS — M461 Sacroiliitis, not elsewhere classified: Secondary | ICD-10-CM

## 2017-08-30 DIAGNOSIS — K219 Gastro-esophageal reflux disease without esophagitis: Secondary | ICD-10-CM | POA: Insufficient documentation

## 2017-08-30 DIAGNOSIS — E669 Obesity, unspecified: Secondary | ICD-10-CM | POA: Insufficient documentation

## 2017-08-30 DIAGNOSIS — M47816 Spondylosis without myelopathy or radiculopathy, lumbar region: Secondary | ICD-10-CM

## 2017-08-30 DIAGNOSIS — E785 Hyperlipidemia, unspecified: Secondary | ICD-10-CM | POA: Insufficient documentation

## 2017-08-30 DIAGNOSIS — M48061 Spinal stenosis, lumbar region without neurogenic claudication: Secondary | ICD-10-CM | POA: Diagnosis not present

## 2017-08-30 DIAGNOSIS — M797 Fibromyalgia: Secondary | ICD-10-CM | POA: Diagnosis not present

## 2017-08-30 DIAGNOSIS — Z79899 Other long term (current) drug therapy: Secondary | ICD-10-CM | POA: Insufficient documentation

## 2017-08-30 DIAGNOSIS — G4730 Sleep apnea, unspecified: Secondary | ICD-10-CM | POA: Insufficient documentation

## 2017-08-30 DIAGNOSIS — I10 Essential (primary) hypertension: Secondary | ICD-10-CM | POA: Diagnosis not present

## 2017-08-30 DIAGNOSIS — M7061 Trochanteric bursitis, right hip: Secondary | ICD-10-CM

## 2017-08-30 DIAGNOSIS — Z885 Allergy status to narcotic agent status: Secondary | ICD-10-CM | POA: Insufficient documentation

## 2017-08-30 DIAGNOSIS — K449 Diaphragmatic hernia without obstruction or gangrene: Secondary | ICD-10-CM | POA: Insufficient documentation

## 2017-08-30 DIAGNOSIS — Z7952 Long term (current) use of systemic steroids: Secondary | ICD-10-CM | POA: Insufficient documentation

## 2017-08-30 DIAGNOSIS — M4698 Unspecified inflammatory spondylopathy, sacral and sacrococcygeal region: Secondary | ICD-10-CM | POA: Diagnosis not present

## 2017-08-30 DIAGNOSIS — M7062 Trochanteric bursitis, left hip: Secondary | ICD-10-CM | POA: Diagnosis not present

## 2017-08-30 DIAGNOSIS — D649 Anemia, unspecified: Secondary | ICD-10-CM | POA: Diagnosis not present

## 2017-08-30 DIAGNOSIS — M1990 Unspecified osteoarthritis, unspecified site: Secondary | ICD-10-CM | POA: Diagnosis not present

## 2017-08-30 DIAGNOSIS — I2510 Atherosclerotic heart disease of native coronary artery without angina pectoris: Secondary | ICD-10-CM | POA: Diagnosis not present

## 2017-08-30 DIAGNOSIS — F329 Major depressive disorder, single episode, unspecified: Secondary | ICD-10-CM | POA: Diagnosis not present

## 2017-08-30 DIAGNOSIS — Z888 Allergy status to other drugs, medicaments and biological substances status: Secondary | ICD-10-CM | POA: Insufficient documentation

## 2017-08-30 DIAGNOSIS — F419 Anxiety disorder, unspecified: Secondary | ICD-10-CM | POA: Insufficient documentation

## 2017-08-30 DIAGNOSIS — Z9049 Acquired absence of other specified parts of digestive tract: Secondary | ICD-10-CM | POA: Insufficient documentation

## 2017-08-30 DIAGNOSIS — E039 Hypothyroidism, unspecified: Secondary | ICD-10-CM | POA: Diagnosis not present

## 2017-08-30 DIAGNOSIS — M47818 Spondylosis without myelopathy or radiculopathy, sacral and sacrococcygeal region: Secondary | ICD-10-CM

## 2017-08-30 DIAGNOSIS — Z9889 Other specified postprocedural states: Secondary | ICD-10-CM | POA: Insufficient documentation

## 2017-08-30 NOTE — Progress Notes (Signed)
[name] Name: Stacy Allen  MRN: [id number]  Referring Provider: Frazier Richards, MD  DOB: [birth year]  PCP: Frazier Richards, MD  DOS: 08/30/2017  Note by: Gillis Santa, MD  Service setting: Ambulatory outpatient  Specialty: Interventional Pain Management  Location: [facility] (AMB) Pain Management Facility    Patient type: Established   Primary Reason(s) for Visit: Encounter for post-procedure evaluation of chronic illness with mild to moderate exacerbation CC: Other (Groin)  HPI  [name] is a 77 y.o. year old, female patient, who comes today for a post-procedure evaluation. 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 Her primarily concern today is the Other (Groin)  Pain Assessment: Location: Right Groin Radiating: Denies Onset: More than a month ago Duration: Chronic pain Quality: Stabbing, Discomfort Severity: 3 /10 (self-reported pain score)  Note: Reported level is compatible with observation.                         When using our objective Pain Scale, levels between 6 and 10/10 are said to belong in an emergency room, as it progressively worsens from a 6/10, described as severely limiting, requiring emergency care not usually available at an outpatient pain management facility. At a 6/10 level, communication becomes difficult and requires great effort. Assistance to reach the emergency department may be required. Facial flushing and profuse sweating along with potentially dangerous increases in heart rate and blood pressure will be evident. Effect on ADL: walking distance Timing: Intermittent Modifying factors: Biofreeze, meds, heating  [name] comes in today for post-procedure evaluation after the treatment done on 08/11/2017.  Further details on both, my assessment(s), as well as the proposed treatment plan, please see  below.  Post-Procedure Assessment  08/11/2017 Procedure: Bilateral therapeutic medial branch facet block at L5, bilateral SI joint injection Pre-procedure pain score:  2/10 Post-procedure pain score: 0/10         Influential Factors: BMI: 40.03 kg/m Intra-procedural challenges: None observed.         Assessment challenges: None detected.              Reported side-effects: None.        Post-procedural adverse reactions or complications: None reported         Sedation: Please see nurses note. When no sedatives are used, the analgesic levels obtained are directly associated to the effectiveness of the local anesthetics. However, when sedation is provided, the level of analgesia obtained during the initial 1 hour following the intervention, is believed to be the result of a combination of factors. These factors may include, but are not limited to: 1. The effectiveness of the local anesthetics used. 2. The effects of the analgesic(s) and/or anxiolytic(s) used. 3. The degree of discomfort experienced by the patient at the time of the procedure. 4. The patients ability and reliability in recalling and recording the events. 5. The presence and influence of possible secondary gains and/or psychosocial factors. Reported result: Relief experienced during the 1st hour after the procedure: 100 % (Ultra-Short Term Relief)            Interpretative annotation: Clinically appropriate result. Analgesia during this period is likely to be Local Anesthetic and/or IV Sedative (Analgesic/Anxiolytic) related.          Effects of local anesthetic: The analgesic effects attained during this period are directly associated to the  localized infiltration of local anesthetics and therefore cary significant diagnostic value as to the etiological location, or anatomical origin, of the pain. Expected duration of relief is directly dependent on the pharmacodynamics of the local anesthetic used. Long-acting (4-6 hours)  anesthetics used.  Reported result: Relief during the next 4 to 6 hour after the procedure: 100 % (Short-Term Relief)            Interpretative annotation: Clinically appropriate result. Analgesia during this period is likely to be Local Anesthetic-related.          Long-term benefit: Defined as the period of time past the expected duration of local anesthetics (1 hour for short-acting and 4-6 hours for long-acting). With the possible exception of prolonged sympathetic blockade from the local anesthetics, benefits during this period are typically attributed to, or associated with, other factors such as analgesic sensory neuropraxia, antiinflammatory effects, or beneficial biochemical changes provided by agents other than the local anesthetics.  Reported result: Extended relief following procedure: 0 % (Long-Term Relief)            Interpretative annotation: Clinically appropriate result. No long-term benefit. No permanent benefit expected. Inflammation plays a part in the etiology to the pain.          Current benefits: Defined as reported results that persistent at this point in time.   Analgesia: 0 %            Function: No benefit ROM: No benefit Interpretative annotation: Recurrence of symptoms. No permanent benefit expected. Results would suggest persistent aggravating factors.          Interpretation: Results would suggest failure of therapy in achieving desired goal(s).                  Plan:  Please see "Plan of Care" for details.                Laboratory Chemistry  Inflammation Markers (CRP: Acute Phase) (ESR: Chronic Phase) No results found for: CRP, ESRSEDRATE, LATICACIDVEN                       Rheumatology Markers No results found for: RF, ANA, [facility], LYMEIGGIGMAB, [facility]              Renal Function Markers Lab Results  Component Value Date   BUN 10 09/13/2012   CREATININE 0.85 09/13/2012   GFRAA >60 11/20/2011   GFRNONAA >60 11/20/2011                  Hepatic Function Markers Lab Results  Component Value Date   AST 15 09/13/2012   ALT 19 09/13/2012   ALBUMIN 3.5 09/13/2012   ALKPHOS 100 09/13/2012                 Electrolytes Lab Results  Component Value Date   NA 136 09/13/2012   K 3.3 (L) 09/13/2012   CL 103 09/13/2012   CALCIUM 8.6 09/13/2012                        Neuropathy Markers No results found for: VITAMINB12, FOLATE, HGBA1C, HIV               Bone Pathology Markers No results found for: [place], [id number], [id number], [id number], 25OHVITD1, 25OHVITD2, 25OHVITD3, TESTOFREE, TESTOSTERONE                       Coagulation Parameters Lab  Results  Component Value Date   PLT 248 09/22/2015                 Cardiovascular Markers Lab Results  Component Value Date   CKTOTAL 54 09/13/2012   CKMB 1.0 09/13/2012   TROPONINI < 0.02 09/13/2012   HGB 11.6 (L) 09/22/2015   HCT 35.4 09/22/2015                 CA Markers No results found for: CEA, CA125, LABCA2               Note: Lab results reviewed.  Recent Diagnostic Imaging Results  DG C-Arm 1-60 Min-No Report Fluoroscopy was utilized by the requesting physician.  No radiographic  interpretation.   Complexity Note: Imaging results reviewed. Results shared with Ms. Dekoning, using Layman's terms.                         Meds   Current Outpatient Medications:  .  acetaminophen (TYLENOL) 500 MG tablet, Take 500 mg by mouth every 6 (six) hours as needed., Disp: , Rfl:  .  albuterol (PROVENTIL HFA;VENTOLIN HFA) 108 (90 BASE) MCG/ACT inhaler, Inhale 2 puffs into the lungs., Disp: , Rfl:  .  amLODipine (NORVASC) 5 MG tablet, Take 5 mg by mouth 2 (two) times daily., Disp: , Rfl:  .  celecoxib (CELEBREX) 200 MG capsule, Take 200 mg by mouth 3 (three) times a week. prn, Disp: , Rfl:  .  citalopram (CELEXA) 40 MG tablet, Take 40 mg by mouth daily., Disp: , Rfl:  .  clonazePAM (KLONOPIN) 1 MG tablet, Take 1 mg by mouth 2 (two) times daily. As needed for anxiety,  Disp: , Rfl:  .  enalapril (VASOTEC) 20 MG tablet, Take 20 mg by mouth daily., Disp: , Rfl:  .  ergocalciferol (VITAMIN D2) 50000 UNITS capsule, Take 50,000 Units by mouth every 30 (thirty) days., Disp: , Rfl:  .  gabapentin (NEURONTIN) 300 MG capsule, Take 600 mg by mouth 3 (three) times daily. Two tabs to total 600 mgm by mouth 3 times daily, Disp: , Rfl:  .  levothyroxine (SYNTHROID, LEVOTHROID) 112 MCG tablet, Take 137 mcg by mouth daily before breakfast. , Disp: , Rfl:  .  metoprolol tartrate (LOPRESSOR) 25 MG tablet, Take 12.5 mg by mouth 2 (two) times daily. , Disp: , Rfl:  .  nitroGLYCERIN (NITROSTAT) 0.4 MG SL tablet, Place 0.4 mg under the tongue every 5 (five) minutes as needed for chest pain., Disp: , Rfl:  .  omeprazole (PRILOSEC) 20 MG capsule, Take 40 mg by mouth 2 (two) times daily before a meal. , Disp: , Rfl:  .  tiZANidine (ZANAFLEX) 2 MG tablet, Take by mouth every 6 (six) hours as needed for muscle spasms., Disp: , Rfl:  .  amoxicillin (AMOXIL) 500 MG capsule, Take 500 mg by mouth 2 (two) times daily., Disp: , Rfl:  .  cetirizine (ZYRTEC) 10 MG tablet, Take 1 tablet (10 mg total) by mouth daily. If needed at night for itching not relieved by Claritin in the morning. (Patient not taking: Reported on 08/30/2017), Disp: 30 tablet, Rfl: 0 .  diclofenac sodium (VOLTAREN) 1 % GEL, Apply 2 g topically 4 (four) times daily., Disp: , Rfl:  .  fluticasone (VERAMYST) 27.5 MCG/SPRAY nasal spray, Place 2 sprays into the nose daily., Disp: , Rfl:  .  HYDROcodone-acetaminophen (NORCO/VICODIN) 5-325 MG tablet, Take 1 tablet by mouth every 4 (four)  hours as needed for moderate pain., Disp: , Rfl:  .  loratadine (CLARITIN) 10 MG tablet, Take 1 tablet (10 mg total) by mouth daily. Take 1 tablet in the morning. As needed for itching. (Patient not taking: Reported on 08/30/2017), Disp: 30 tablet, Rfl: 0 .  meclizine (ANTIVERT) 25 MG tablet, Take 25 mg by mouth 3 (three) times daily as needed for  dizziness., Disp: , Rfl:  .  phenazopyridine (PYRIDIUM) 95 MG tablet, Take 95 mg by mouth 3 (three) times daily as needed for pain., Disp: , Rfl:  .  pravastatin (PRAVACHOL) 40 MG tablet, Take 40 mg by mouth at bedtime., Disp: , Rfl:  .  predniSONE (DELTASONE) 10 MG tablet, Take 10 mg by mouth daily with breakfast., Disp: , Rfl:  .  pseudoephedrine (SUDAFED) 30 MG tablet, Take 30 mg by mouth every 8 (eight) hours as needed for congestion., Disp: , Rfl:  .  ranitidine (ZANTAC) 150 MG capsule, Take 1 capsule (150 mg total) by mouth 2 (two) times daily. (Patient not taking: Reported on 08/02/2017), Disp: 30 capsule, Rfl: 0 .  risperiDONE (RISPERDAL) 0.25 MG tablet, Take 0.25 mg by mouth at bedtime., Disp: , Rfl:  .  sulfamethoxazole-trimethoprim (BACTRIM DS,SEPTRA DS) 800-160 MG tablet, Take 1 tablet by mouth 2 (two) times daily. (Patient not taking: Reported on 06/01/2017), Disp: 20 tablet, Rfl: 0  ROS  Constitutional: Denies any fever or chills Gastrointestinal: No reported hemesis, hematochezia, vomiting, or acute GI distress Musculoskeletal: Denies any acute onset joint swelling, redness, loss of ROM, or weakness Neurological: No reported episodes of acute onset apraxia, aphasia, dysarthria, agnosia, amnesia, paralysis, loss of coordination, or loss of consciousness  Allergies  Ms. Martenson is allergic to zithromax [azithromycin]; adhesive [tape]; codeine; lactose intolerance (gi); lodine [etodolac]; morphine and related; red dye; shellfish-derived products; and latex.  [place]  Drug: Ms. Giannelli  reports that she does not use drugs. Alcohol:  reports that she does not drink alcohol. Tobacco:  reports that she has quit smoking. she has never used smokeless tobacco. Medical:  has a past medical history of Adenomatous polyps, Allergic state, Anemia, Anginal pain ([place]), Anxiety, Arthritis, Broken rib, Cancer ([place]), Chicken pox, Coronary artery disease, Depression, Eczema, Fibromyalgia, GERD  (gastroesophageal reflux disease), Headache, Hemorrhoids, History of hiatal hernia, Hypercalcemia, Hyperlipidemia, Hypertension, Hypothyroidism, Lumbar stenosis, Obesity, Peptic ulcer disease, Shortness of breath dyspnea, Sleep apnea, Vertigo, and Vitamin D deficiency. Surgical: Ms. Fromer  has a past surgical history that includes Abdominal hysterectomy; Cardiac catheterization (N/A, 04/03/2015); Back surgery (2007); Cholecystectomy; Fracture surgery (Left, 1991); Coronary angioplasty (2015); Image guided sinus surgery (Bilateral, 09/28/2015); and Esophagogastroduodenoscopy (egd) with propofol (N/A, 12/21/2016). Family: family history is not on file.  Constitutional Exam  General appearance: Well nourished, well developed, and well hydrated. In no apparent acute distress Vitals:   08/30/17 1005  BP: (!) 163/77  Pulse: 64  Temp: 97.7 F (36.5 C)  SpO2: 98%  Weight: 226 lb (102.5 kg)  Height: 5' 3"  (1.6 m)   BMI Assessment: Estimated body mass index is 40.03 kg/m as calculated from the following:   Height as of this encounter: 5' 3"  (1.6 m).   Weight as of this encounter: 226 lb (102.5 kg).  BMI interpretation table: BMI level Category Range association with higher incidence of chronic pain  <18 kg/m2 Underweight   18.5-24.9 kg/m2 Ideal body weight   25-29.9 kg/m2 Overweight Increased incidence by 20%  30-34.9 kg/m2 Obese (Class I) Increased incidence by 68%  35-39.9 kg/m2 Severe obesity (Class  II) Increased incidence by 136%  >40 kg/m2 Extreme obesity (Class III) Increased incidence by 254%   BMI Readings from Last 4 Encounters:  08/30/17 40.03 kg/m  08/02/17 39.86 kg/m  07/18/17 39.86 kg/m  06/21/17 39.50 kg/m   Wt Readings from Last 4 Encounters:  08/30/17 226 lb (102.5 kg)  08/02/17 225 lb (102.1 kg)  07/18/17 225 lb (102.1 kg)  06/21/17 223 lb (101.2 kg)  Psych/Mental status: Alert, oriented x 3 (person, place, & time)       Eyes: PERLA Respiratory: No evidence of acute  respiratory distress  Cervical Spine Area Exam  Skin & Axial Inspection: No masses, redness, edema, swelling, or associated skin lesions Alignment: Symmetrical Functional ROM: Unrestricted ROM      Stability: No instability detected Muscle Tone/Strength: Functionally intact. No obvious neuro-muscular anomalies detected. Sensory (Neurological): Unimpaired Palpation: No palpable anomalies              Upper Extremity (UE) Exam    Side: Right upper extremity  Side: Left upper extremity  Skin & Extremity Inspection: Skin color, temperature, and hair growth are WNL. No peripheral edema or cyanosis. No masses, redness, swelling, asymmetry, or associated skin lesions. No contractures.  Skin & Extremity Inspection: Skin color, temperature, and hair growth are WNL. No peripheral edema or cyanosis. No masses, redness, swelling, asymmetry, or associated skin lesions. No contractures.  Functional ROM: Unrestricted ROM          Functional ROM: Unrestricted ROM          Muscle Tone/Strength: Functionally intact. No obvious neuro-muscular anomalies detected.  Muscle Tone/Strength: Functionally intact. No obvious neuro-muscular anomalies detected.  Sensory (Neurological): Unimpaired          Sensory (Neurological): Unimpaired          Palpation: No palpable anomalies              Palpation: No palpable anomalies              Specialized Test(s): Deferred         Specialized Test(s): Deferred          Thoracic Spine Area Exam  Skin & Axial Inspection: No masses, redness, or swelling Alignment: Symmetrical Functional ROM: Unrestricted ROM Stability: No instability detected Muscle Tone/Strength: Functionally intact. No obvious neuro-muscular anomalies detected. Sensory (Neurological): Unimpaired Muscle strength & Tone: No palpable anomalies  Lumbar Spine Area Exam  Skin & Axial Inspection: Well healed scar from previous spine surgery detected Alignment: Symmetrical Functional ROM: Decreased ROM,  bilaterally Stability: No instability detected Muscle Tone/Strength: Functionally intact. No obvious neuro-muscular anomalies detected. Sensory (Neurological): Movement-associated discomfort Palpation: Complains of area being tender to palpation Bilateral Fist Percussion Test Provocative Tests: Lumbar Hyperextension and rotation test: Positive bilaterally for facet joint pain. Lumbar Lateral bending test: Positive due to fusion restriction. Patrick's Maneuver: Positive for bilateral S-I arthralgia and for bilateral hip arthralgia  Gait & Posture Assessment  Ambulation: Unassisted Gait: Relatively normal for age and body habitus Posture: WNL   Lower Extremity Exam    Side: Right lower extremity  Side: Left lower extremity  Skin & Extremity Inspection: Skin color, temperature, and hair growth are WNL. No peripheral edema or cyanosis. No masses, redness, swelling, asymmetry, or associated skin lesions. No contractures.  Skin & Extremity Inspection: Skin color, temperature, and hair growth are WNL. No peripheral edema or cyanosis. No masses, redness, swelling, asymmetry, or associated skin lesions. No contractures.  Functional ROM: Decreased ROM for hip joint  Functional ROM:  Decreased ROM for hip joint  Muscle Tone/Strength: Functionally intact. No obvious neuro-muscular anomalies detected.  Muscle Tone/Strength: Functionally intact. No obvious neuro-muscular anomalies detected.  Sensory (Neurological): Arthropathic arthralgia  Sensory (Neurological): Arthropathic arthralgia  Palpation: Complains of area being tender to palpation  Palpation: Complains of area being tender to palpation  Tender to palpation overlying bilateral trochanteric bursa with reproduction of pain that radiates into her groin   Assessment  Primary Diagnosis & Pertinent Problem List: The primary encounter diagnosis was Trochanteric bursitis of both hips. 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  Status Diagnosis  Having a Flare-up Persistent Persistent 1. Trochanteric bursitis of both hips   2. Lumbar spondylosis   3. 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 Chronic pain syndrome     General Recommendations: The pain condition that the patient suffers from is best treated with a multidisciplinary approach that involves an increase in physical activity to prevent de-conditioning and worsening of the pain cycle, as well as psychological counseling (formal and/or informal) to address the co-morbid psychological affects of pain. Treatment will often involve judicious use of pain medications and interventional procedures to decrease the pain, allowing the patient to participate in the physical activity that will ultimately produce long-lasting pain reductions. The goal of the multidisciplinary approach is to return the patient to a higher level of overall function and to restore their ability to perform activities of daily living.  77 year old female who presents for postprocedural follow-up status post bilateral L5 medial branch nerve block injections #2 along with bilateral SI joint block #2. Patient has a history of a transforaminal lumbar interbody fusion from L2-L4 in 2007. Patient also has moderate stenosis and degeneration at L1-L2 along with L5-S1. This is to be expected given that these are the adjacent levels next to her fusion. Patient does have severe limitation with lumbar extension and lateral rotation.  Patient states that the injections were only beneficial for approximately the first 2 days but then she has had return of her pain since then.  Patient states that she obtained greater benefit after her first set of diagnostic blocks.  Patient states that she has been ambulating more and feels that could could be contributing to her hip pain.  She is describing pain that is most pronounced in the lateral aspect of her hip that radiates  into her groin.  This pain is reproducible upon palpation of bilateral bursa was suggesting bilateral trochanteric bursitis.  Patient has had bursa injections in the past and found these helpful for her lateral hip pain.  We have discussed repeating the procedure.  Risks and benefits of bilateral trochanteric bursa injections were discussed.  Patient would like to proceed.   Plan of Care  Lab-work, procedure(s), and/or referral(s): Orders Placed This Encounter  Procedures  . HIP INJECTION   Time Note: Greater than 50% of the 25 minute(s) of face-to-face time spent with Ms. Kamath, was spent in counseling/coordination of care regarding: Ms. Tapper primary cause of pain, the treatment plan, treatment alternatives, the risks and possible complications of proposed treatment, going over the informed consent, the results, interpretation and significance of  her recent diagnostic interventional treatment(s) and realistic expectations.  Provider-requested follow-up: Return in about 2 weeks (around 09/13/2017) for Procedure.  No future appointments.  Primary Care Physician: Frazier Richards, MD Location: [facility] Note by: Gillis Santa, M.D Date: 08/30/2017; Time: 1:52 PM  Patient  Instructions  Do not eat or drink 3 hours before your appointment if you do not get sedation.  ____________________________________________________________________________________________  Preparing for Procedure with Sedation  Instructions: . Oral Intake: Do not eat or drink anything for at least 8 hours prior to your procedure. . Transportation: Public transportation is not allowed. Bring an adult driver. The driver must be physically present in our waiting room before any procedure can be started. [name] Physical Assistance: Bring an adult physically capable of assisting you, in the event you need help. This adult should keep you company at home for at least 6 hours after the  procedure. . Blood Pressure Medicine: Take your blood pressure medicine with a sip of water the morning of the procedure. . Blood thinners:  . Diabetics on insulin: Notify the staff so that you can be scheduled 1st case in the morning. If your diabetes requires high dose insulin, take only  of your normal insulin dose the morning of the procedure and notify the staff that you have done so. . Preventing infections: Shower with an antibacterial soap the morning of your procedure. . Build-up your immune system: Take 1000 mg of Vitamin C with every meal (3 times a day) the day prior to your procedure. [name] Antibiotics: Inform the staff if you have a condition or reason that requires you to take antibiotics before dental procedures. . Pregnancy: If you are pregnant, call and cancel the procedure. . Sickness: If you have a cold, fever, or any active infections, call and cancel the procedure. . Arrival: You must be in the facility at least 30 minutes prior to your scheduled procedure. . Children: Do not bring children with you. . Dress appropriately: Bring dark clothing that you would not mind if they get stained. . Valuables: Do not bring any jewelry or valuables.  Procedure appointments are reserved for interventional treatments only. [name] No Prescription Refills. . No medication changes will be discussed during procedure appointments. . No disability issues will be discussed.  Remember:  Regular Business hours are:  Monday to Thursday 8:00 AM to 4:00 PM  Provider's Schedule: Milinda Pointer, MD:  Procedure days: Tuesday and Thursday 7:30 AM to 4:00 PM  Gillis Santa, MD:  Procedure days: Monday and Wednesday 7:30 AM to 4:00 PM ____________________________________________________________________________________________

## 2017-08-30 NOTE — Patient Instructions (Signed)
Do not eat or drink 3 hours before your appointment if you do not get sedation.  ____________________________________________________________________________________________  Preparing for Procedure with Sedation  Instructions: . Oral Intake: Do not eat or drink anything for at least 8 hours prior to your procedure. . Transportation: Public transportation is not allowed. Bring an adult driver. The driver must be physically present in our waiting room before any procedure can be started. [name] Physical Assistance: Bring an adult physically capable of assisting you, in the event you need help. This adult should keep you company at home for at least 6 hours after the procedure. . Blood Pressure Medicine: Take your blood pressure medicine with a sip of water the morning of the procedure. . Blood thinners:  . Diabetics on insulin: Notify the staff so that you can be scheduled 1st case in the morning. If your diabetes requires high dose insulin, take only  of your normal insulin dose the morning of the procedure and notify the staff that you have done so. . Preventing infections: Shower with an antibacterial soap the morning of your procedure. . Build-up your immune system: Take 1000 mg of Vitamin C with every meal (3 times a day) the day prior to your procedure. [name] Antibiotics: Inform the staff if you have a condition or reason that requires you to take antibiotics before dental procedures. . Pregnancy: If you are pregnant, call and cancel the procedure. . Sickness: If you have a cold, fever, or any active infections, call and cancel the procedure. . Arrival: You must be in the facility at least 30 minutes prior to your scheduled procedure. . Children: Do not bring children with you. . Dress appropriately: Bring dark clothing that you would not mind if they get stained. . Valuables: Do not bring any jewelry or valuables.  Procedure appointments are reserved for interventional treatments only. [name] No  Prescription Refills. . No medication changes will be discussed during procedure appointments. . No disability issues will be discussed.  Remember:  Regular Business hours are:  Monday to Thursday 8:00 AM to 4:00 PM  Provider's Schedule: Milinda Pointer, MD:  Procedure days: Tuesday and Thursday 7:30 AM to 4:00 PM  Gillis Santa, MD:  Procedure days: Monday and Wednesday 7:30 AM to 4:00 PM ____________________________________________________________________________________________

## 2017-09-11 ENCOUNTER — Encounter: Payer: Self-pay | Admitting: Student in an Organized Health Care Education/Training Program

## 2017-09-11 ENCOUNTER — Other Ambulatory Visit: Payer: Self-pay

## 2017-09-11 ENCOUNTER — Ambulatory Visit (HOSPITAL_BASED_OUTPATIENT_CLINIC_OR_DEPARTMENT_OTHER): Payer: Medicare HMO | Admitting: Student in an Organized Health Care Education/Training Program

## 2017-09-11 ENCOUNTER — Ambulatory Visit
Admission: RE | Admit: 2017-09-11 | Discharge: 2017-09-11 | Disposition: A | Discharge status: Home / Self Care | Payer: Medicare HMO | Source: Ambulatory Visit | Attending: Student in an Organized Health Care Education/Training Program | Admitting: Student in an Organized Health Care Education/Training Program

## 2017-09-11 VITALS — BP 145/82 | HR 58 | Temp 98.2°F | Resp 14 | Ht 63.0 in | Wt 222.0 lb

## 2017-09-11 DIAGNOSIS — Z881 Allergy status to other antibiotic agents status: Secondary | ICD-10-CM | POA: Insufficient documentation

## 2017-09-11 DIAGNOSIS — Z91011 Allergy to milk products: Secondary | ICD-10-CM | POA: Diagnosis not present

## 2017-09-11 DIAGNOSIS — Z888 Allergy status to other drugs, medicaments and biological substances status: Secondary | ICD-10-CM | POA: Diagnosis not present

## 2017-09-11 DIAGNOSIS — M7062 Trochanteric bursitis, left hip: Secondary | ICD-10-CM | POA: Diagnosis not present

## 2017-09-11 DIAGNOSIS — Z91040 Latex allergy status: Secondary | ICD-10-CM | POA: Diagnosis not present

## 2017-09-11 DIAGNOSIS — M7061 Trochanteric bursitis, right hip: Secondary | ICD-10-CM | POA: Insufficient documentation

## 2017-09-11 DIAGNOSIS — Z7952 Long term (current) use of systemic steroids: Secondary | ICD-10-CM | POA: Diagnosis not present

## 2017-09-11 DIAGNOSIS — Z79899 Other long term (current) drug therapy: Secondary | ICD-10-CM | POA: Insufficient documentation

## 2017-09-11 DIAGNOSIS — Z9049 Acquired absence of other specified parts of digestive tract: Secondary | ICD-10-CM | POA: Diagnosis not present

## 2017-09-11 DIAGNOSIS — Z9889 Other specified postprocedural states: Secondary | ICD-10-CM | POA: Insufficient documentation

## 2017-09-11 DIAGNOSIS — Z91048 Other nonmedicinal substance allergy status: Secondary | ICD-10-CM | POA: Insufficient documentation

## 2017-09-11 DIAGNOSIS — Z885 Allergy status to narcotic agent status: Secondary | ICD-10-CM | POA: Diagnosis not present

## 2017-09-11 MED ORDER — LACTATED RINGERS IV SOLN: 1000.0000 mL | Freq: Once | INTRAVENOUS | Status: DC

## 2017-09-11 MED ORDER — DEXAMETHASONE SODIUM PHOSPHATE 10 MG/ML IJ SOLN
10.0000 mg | Freq: Once | INTRAMUSCULAR | Status: AC
  Administered 2017-09-11: 10 mg
  Filled 2017-09-11: qty 1

## 2017-09-11 MED ORDER — ROPIVACAINE HCL 2 MG/ML IJ SOLN
10.0000 mL | Freq: Once | INTRAMUSCULAR | Status: AC
  Administered 2017-09-11: 10 mL
  Filled 2017-09-11: qty 10

## 2017-09-11 MED ORDER — LIDOCAINE HCL 1 % IJ SOLN
10.0000 mL | Freq: Once | INTRAMUSCULAR | Status: AC
  Administered 2017-09-11: 5 mL
  Filled 2017-09-11: qty 10

## 2017-09-11 MED ORDER — FENTANYL CITRATE (PF) 100 MCG/2ML IJ SOLN: 25.0000 ug | INTRAMUSCULAR | Status: DC | PRN

## 2017-09-11 NOTE — Patient Instructions (Signed)
Pain Management Discharge Instructions  General Discharge Instructions :  If you need to reach your doctor call: Monday-Friday 8:00 am - 4:00 pm at [phone] or toll free [phone].  After clinic hours [phone] to have operator reach doctor.  Bring all of your medication bottles to all your appointments in the pain clinic.  To cancel or reschedule your appointment with Pain Management please remember to call 24 hours in advance to avoid a fee.  Refer to the educational materials which you have been given on: General Risks, I had my Procedure. Discharge Instructions, Post Sedation.  Post Procedure Instructions:  Please notify your doctor immediately if you have any unusual bleeding, trouble breathing or pain that is not related to your normal pain.  Depending on the type of procedure that was done, some parts of your body may feel week and/or numb.  This usually clears up by tonight or the next day.  Walk with the use of an assistive device or accompanied by an adult for the 24 hours.  You may use ice on the affected area for the first 24 hours.  Put ice in a Ziploc bag and cover with a towel and place against area 15 minutes on 15 minutes off.  You may switch to heat after 24 hours. 

## 2017-09-11 NOTE — Progress Notes (Signed)
Safety precautions to be maintained throughout the outpatient stay will include: orient to surroundings, keep bed in low position, maintain call bell within reach at all times, provide assistance with transfer out of bed and ambulation.  

## 2017-09-11 NOTE — Progress Notes (Signed)
[name] Name: Stacy Allen  MRN: [id number]  Referring Provider: Frazier Richards, MD  DOB: [birth year]  PCP: Frazier Richards, MD  DOS: 09/11/2017  Note by: Gillis Santa, MD  Service setting: Ambulatory outpatient  Specialty: Interventional Pain Management  Patient type: Established  Location: [facility] (AMB) Pain Management Facility  Visit type: Interventional Procedure   Primary Reason for Visit: Interventional Pain Management Treatment. CC: Hip Pain (bilateral)  Procedure:       Anesthesia, Analgesia, Anxiolysis:  Type: Trochanteric Bursa Injection #1  Primary Purpose: Diagnostic Region: Upper (proximal) Femoral Region Level: Hip Joint Target Area: Superior aspect of the hip joint cavity, going thru the superior portion of the capsular ligament. Approach: Posterolateral approach Laterality: Bilateral Position: Prone  Type: Local Anesthesia Indication(s): Analgesia         Local Anesthetic: Lidocaine 1-2% Route: Infiltration ([place]/IM) IV Access: Declined Sedation: Declined    Indications: 1. Trochanteric bursitis of both hips    Pain Score: Pre-procedure: 3 /10 Post-procedure: 4 /10  Pre-op Assessment:  Stacy Allen is a 77 y.o. (year old), female patient, seen today for interventional treatment. She  has a past surgical history that includes Abdominal hysterectomy; Cardiac catheterization (N/A, 04/03/2015); Back surgery (2007); Cholecystectomy; Fracture surgery (Left, 1991); Coronary angioplasty (2015); Image guided sinus surgery (Bilateral, 09/28/2015); and Esophagogastroduodenoscopy (egd) with propofol (N/A, 12/21/2016). Stacy Allen has a current medication list which includes the following prescription(s): acetaminophen, albuterol, amlodipine, amoxicillin, celecoxib, citalopram, clonazepam, diclofenac sodium, enalapril, ergocalciferol, fluticasone, gabapentin, hydrocodone-acetaminophen, levothyroxine, loratadine, meclizine, metoprolol tartrate, nitroglycerin, omeprazole, phenazopyridine,  pravastatin, prednisone, pseudoephedrine, risperidone, tizanidine, cetirizine, ranitidine, and sulfamethoxazole-trimethoprim, and the following Facility-Administered Medications: fentanyl and lactated ringers. Her primarily concern today is the Hip Pain (bilateral)  Initial Vital Signs:  Pulse Rate: (!) 58 Temp: 98.2 F (36.8 C) Resp: 14 BP: (!) 155/95 SpO2: 99 %  BMI: Estimated body mass index is 39.33 kg/m as calculated from the following:   Height as of this encounter: 5\' 3"  (1.6 m).   Weight as of this encounter: 222 lb (100.7 kg).  Risk Assessment: Allergies: Reviewed. She is allergic to zithromax [azithromycin]; adhesive [tape]; codeine; lactose intolerance (gi); lodine [etodolac]; morphine and related; red dye; shellfish-derived products; and latex.  Allergy Precautions: None required Coagulopathies: Reviewed. None identified.  Blood-thinner therapy: None at this time Active Infection(s): Reviewed. None identified. Stacy Allen is afebrile  Site Confirmation: Stacy Allen was asked to confirm the procedure and laterality before marking the site Procedure checklist: Completed Consent: Before the procedure and under the influence of no sedative(s), amnesic(s), or anxiolytics, the patient was informed of the treatment options, risks and possible complications. To fulfill our ethical and legal obligations, as recommended by the American Medical Association's Code of Ethics, I have informed the patient of my clinical impression; the nature and purpose of the treatment or procedure; the risks, benefits, and possible complications of the intervention; the alternatives, including doing nothing; the risk(s) and benefit(s) of the alternative treatment(s) or procedure(s); and the risk(s) and benefit(s) of doing nothing. The patient was provided information about the general risks and possible complications associated with the procedure. These may include, but are not limited to: failure to achieve  desired goals, infection, bleeding, organ or nerve damage, allergic reactions, paralysis, and death. In addition, the patient was informed of those risks and complications associated to the procedure, such as failure to decrease pain; infection; bleeding; organ or nerve damage with subsequent damage to sensory, motor, and/or autonomic systems, resulting in permanent pain, numbness,  and/or weakness of one or several areas of the body; allergic reactions; (i.e.: anaphylactic reaction); and/or death. Furthermore, the patient was informed of those risks and complications associated with the medications. These include, but are not limited to: allergic reactions (i.e.: anaphylactic or anaphylactoid reaction(s)); adrenal axis suppression; blood sugar elevation that in diabetics may result in ketoacidosis or comma; water retention that in patients with history of congestive heart failure may result in shortness of breath, pulmonary edema, and decompensation with resultant heart failure; weight gain; swelling or edema; medication-induced neural toxicity; particulate matter embolism and blood vessel occlusion with resultant organ, and/or nervous system infarction; and/or aseptic necrosis of one or more joints. Finally, the patient was informed that Medicine is not an exact science; therefore, there is also the possibility of unforeseen or unpredictable risks and/or possible complications that may result in a catastrophic outcome. The patient indicated having understood very clearly. We have given the patient no guarantees and we have made no promises. Enough time was given to the patient to ask questions, all of which were answered to the patient's satisfaction. Stacy Allen has indicated that she wanted to continue with the procedure. Attestation: I, the ordering provider, attest that I have discussed with the patient the benefits, risks, side-effects, alternatives, likelihood of achieving goals, and potential problems  during recovery for the procedure that I have provided informed consent. Date  Time: 09/11/2017  9:40 AM  Pre-Procedure Preparation:  Monitoring: As per clinic protocol. Respiration, ETCO2, SpO2, BP, heart rate and rhythm monitor placed and checked for adequate function Safety Precautions: Patient was assessed for positional comfort and pressure points before starting the procedure. Time-out: I initiated and conducted the "Time-out" before starting the procedure, as per protocol. The patient was asked to participate by confirming the accuracy of the "Time Out" information. Verification of the correct person, site, and procedure were performed and confirmed by me, the nursing staff, and the patient. "Time-out" conducted as per Joint Commission's Universal Protocol (UP.01.01.01). Time: 1013  Description of Procedure Process:  Area Prepped: Entire Posterolateral hip area. Prepping solution: ChloraPrep (2% chlorhexidine gluconate and 70% isopropyl alcohol) Safety Precautions: Aspiration looking for blood return was conducted prior to all injections. At no point did we inject any substances, as a needle was being advanced. No attempts were made at seeking any paresthesias. Safe injection practices and needle disposal techniques used. Medications properly checked for expiration dates. SDV (single dose vial) medications used. Description of the Procedure: Protocol guidelines were followed. The patient was placed in position over the procedure table. The target area was identified and the area prepped in the usual manner. Skin desensitized using vapocoolant spray. Skin & deeper tissues infiltrated with local anesthetic. Appropriate amount of time allowed to pass for local anesthetics to take effect. The procedure needles were then advanced to the target area. Proper needle placement secured. Negative aspiration confirmed. Solution injected in intermittent fashion, asking for systemic symptoms every 0.5cc of  injectate. The needles were then removed and the area cleansed, making sure to leave some of the prepping solution back to take advantage of its long term bactericidal properties. Vitals:   09/11/17 0952 09/11/17 1017 09/11/17 1022 09/11/17 1027  BP: (!) 155/95 (!) 159/99 (!) 162/97 (!) 145/82  Pulse: (!) 58     Resp: 14 17 13 14   Temp: 98.2 F (36.8 C)     TempSrc: Oral     SpO2: 99% 98% 99% 99%  Weight: 222 lb (100.7 kg)  Height: 5\' 3"  (1.6 m)       Start Time: 1013 hrs. End Time: 1024 hrs.  Materials:  Needle(s) Type: Regular needle Gauge: 22G Length: 3.5-in Medication(s): Please see orders for medications and dosing details. 6 cc solution made of 5 cc of 0.2% ropivacaine, 1 cc of Decadron 10 mg/cc.  3 cc injected in and around each trochanteric bursa. Imaging Guidance (Non-Spinal):  Type of Imaging Technique: Fluoroscopy Guidance (Non-Spinal) Indication(s): Assistance in needle guidance and placement for procedures requiring needle placement in or near specific anatomical locations not easily accessible without such assistance. Exposure Time: Please see nurses notes. Contrast: None used. Fluoroscopic Guidance: I was personally present during the use of fluoroscopy. "Tunnel Vision Technique" used to obtain the best possible view of the target area. Parallax error corrected before commencing the procedure. "Direction-depth-direction" technique used to introduce the needle under continuous pulsed fluoroscopy. Once target was reached, antero-posterior, oblique, and lateral fluoroscopic projection used confirm needle placement in all planes. Images permanently stored in EMR. Interpretation: I personally interpreted the imaging intraoperatively. Adequate needle placement confirmed in multiple planes. Appropriate spread of contrast into desired area was observed. No evidence of afferent or efferent intravascular uptake. Permanent images saved into the patient's record.  Antibiotic  Prophylaxis:   Anti-infectives (From admission, onward)   None     Indication(s): None identified  Post-operative Assessment:  Post-procedure Vital Signs:  Pulse Rate: (!) 58 Temp: 98.2 F (36.8 C) Resp: 14 BP: (!) 145/82 SpO2: 99 %  EBL: None  Complications: No immediate post-treatment complications observed by team, or reported by patient.  Note: The patient tolerated the entire procedure well. A repeat set of vitals were taken after the procedure and the patient was kept under observation following institutional policy, for this type of procedure. Post-procedural neurological assessment was performed, showing return to baseline, prior to discharge. The patient was provided with post-procedure discharge instructions, including a section on how to identify potential problems. Should any problems arise concerning this procedure, the patient was given instructions to immediately contact us, at any time, without hesitation. In any case, we plan to contact the patient by telephone for a follow-up status report regarding this interventional procedure.  Comments:  No additional relevant information. 5 out of 5 strength bilateral lower extremity: Plantar flexion, dorsiflexion, knee flexion, knee extension.  Plan of Care   Imaging Orders     DG C-Arm 1-60 Min-No Report Procedure Orders    No procedure(s) ordered today    Medications ordered for procedure: Meds ordered this encounter  Medications  . lactated ringers infusion 1,000 mL  . fentaNYL (SUBLIMAZE) injection 25-100 mcg    Make sure Narcan is available in the pyxis when using this medication. In the event of respiratory depression (RR< 8/min): Titrate NARCAN (naloxone) in increments of 0.1 to 0.2 mg IV at 2-3 minute intervals, until desired degree of reversal.  . lidocaine (XYLOCAINE) 1 % (with pres) injection 10 mL  . ropivacaine (PF) 2 mg/mL (0.2%) (NAROPIN) injection 10 mL  . dexamethasone (DECADRON) injection 10 mg    Medications administered: We administered lidocaine, ropivacaine (PF) 2 mg/mL (0.2%), and dexamethasone.  See the medical record for exact dosing, route, and time of administration.  New Prescriptions   No medications on file   Disposition: Discharge home  Discharge Date & Time: 09/11/2017; 1035 hrs.   Physician-requested Follow-up: Return in about 4 weeks (around 10/09/2017) for Post Procedure Evaluation.  Future Appointments  Date Time Provider [place]  10/09/2017  1:30 PM Gillis Santa, MD [facility] None   Primary Care Physician: Frazier Richards, MD Location: [facility] Facility Note by: Gillis Santa, MD Date: 09/11/2017; Time: 12:09 PM  Disclaimer:  Medicine is not an exact science. The only guarantee in medicine is that nothing is guaranteed. It is important to note that the decision to proceed with this intervention was based on the information collected from the patient. The Data and conclusions were drawn from the patient's questionnaire, the interview, and the physical examination. Because the information was provided in large part by the patient, it cannot be guaranteed that it has not been purposely or unconsciously manipulated. Every effort has been made to obtain as much relevant data as possible for this evaluation. It is important to note that the conclusions that lead to this procedure are derived in large part from the available data. Always take into account that the treatment will also be dependent on availability of resources and existing treatment guidelines, considered by other Pain Management Practitioners as being common knowledge and practice, at the time of the intervention. For Medico-Legal purposes, it is also important to point out that variation in procedural techniques and pharmacological choices are the acceptable norm. The indications, contraindications, technique, and results of the above procedure should only be interpreted and  judged by a Board-Certified Interventional Pain Specialist with extensive familiarity and expertise in the same exact procedure and technique.

## 2017-09-12 ENCOUNTER — Telehealth: Payer: Self-pay | Admitting: *Deleted

## 2017-09-12 NOTE — Telephone Encounter (Signed)
Attempted to call for post procedure follow-up. Message left. 

## 2017-10-09 ENCOUNTER — Ambulatory Visit: Payer: Medicare HMO | Admitting: Student in an Organized Health Care Education/Training Program

## 2017-10-18 ENCOUNTER — Encounter: Payer: Self-pay | Admitting: Student in an Organized Health Care Education/Training Program

## 2017-10-18 ENCOUNTER — Other Ambulatory Visit: Payer: Self-pay

## 2017-10-18 ENCOUNTER — Encounter (INDEPENDENT_AMBULATORY_CARE_PROVIDER_SITE_OTHER): Payer: Self-pay

## 2017-10-18 ENCOUNTER — Ambulatory Visit
Payer: Medicare HMO | Attending: Student in an Organized Health Care Education/Training Program | Admitting: Student in an Organized Health Care Education/Training Program

## 2017-10-18 VITALS — BP 158/93 | HR 67 | Temp 97.7°F | Resp 18 | Ht 63.0 in | Wt 228.2 lb

## 2017-10-18 DIAGNOSIS — M1990 Unspecified osteoarthritis, unspecified site: Secondary | ICD-10-CM | POA: Diagnosis not present

## 2017-10-18 DIAGNOSIS — Z981 Arthrodesis status: Secondary | ICD-10-CM

## 2017-10-18 DIAGNOSIS — Z885 Allergy status to narcotic agent status: Secondary | ICD-10-CM | POA: Insufficient documentation

## 2017-10-18 DIAGNOSIS — I10 Essential (primary) hypertension: Secondary | ICD-10-CM | POA: Insufficient documentation

## 2017-10-18 DIAGNOSIS — M5136 Other intervertebral disc degeneration, lumbar region: Secondary | ICD-10-CM | POA: Insufficient documentation

## 2017-10-18 DIAGNOSIS — M7918 Myalgia, other site: Secondary | ICD-10-CM

## 2017-10-18 DIAGNOSIS — F419 Anxiety disorder, unspecified: Secondary | ICD-10-CM | POA: Insufficient documentation

## 2017-10-18 DIAGNOSIS — F329 Major depressive disorder, single episode, unspecified: Secondary | ICD-10-CM | POA: Diagnosis not present

## 2017-10-18 DIAGNOSIS — K449 Diaphragmatic hernia without obstruction or gangrene: Secondary | ICD-10-CM | POA: Insufficient documentation

## 2017-10-18 DIAGNOSIS — M7062 Trochanteric bursitis, left hip: Secondary | ICD-10-CM | POA: Diagnosis not present

## 2017-10-18 DIAGNOSIS — Z881 Allergy status to other antibiotic agents status: Secondary | ICD-10-CM | POA: Diagnosis not present

## 2017-10-18 DIAGNOSIS — Z888 Allergy status to other drugs, medicaments and biological substances status: Secondary | ICD-10-CM | POA: Diagnosis not present

## 2017-10-18 DIAGNOSIS — D649 Anemia, unspecified: Secondary | ICD-10-CM | POA: Insufficient documentation

## 2017-10-18 DIAGNOSIS — M47816 Spondylosis without myelopathy or radiculopathy, lumbar region: Secondary | ICD-10-CM | POA: Insufficient documentation

## 2017-10-18 DIAGNOSIS — M533 Sacrococcygeal disorders, not elsewhere classified: Secondary | ICD-10-CM | POA: Diagnosis not present

## 2017-10-18 DIAGNOSIS — M25552 Pain in left hip: Secondary | ICD-10-CM | POA: Diagnosis present

## 2017-10-18 DIAGNOSIS — M25551 Pain in right hip: Secondary | ICD-10-CM | POA: Diagnosis present

## 2017-10-18 DIAGNOSIS — R1031 Right lower quadrant pain: Secondary | ICD-10-CM | POA: Insufficient documentation

## 2017-10-18 DIAGNOSIS — Z79899 Other long term (current) drug therapy: Secondary | ICD-10-CM | POA: Insufficient documentation

## 2017-10-18 DIAGNOSIS — K279 Peptic ulcer, site unspecified, unspecified as acute or chronic, without hemorrhage or perforation: Secondary | ICD-10-CM | POA: Insufficient documentation

## 2017-10-18 DIAGNOSIS — I2510 Atherosclerotic heart disease of native coronary artery without angina pectoris: Secondary | ICD-10-CM | POA: Insufficient documentation

## 2017-10-18 DIAGNOSIS — G4730 Sleep apnea, unspecified: Secondary | ICD-10-CM | POA: Insufficient documentation

## 2017-10-18 DIAGNOSIS — E669 Obesity, unspecified: Secondary | ICD-10-CM | POA: Diagnosis not present

## 2017-10-18 DIAGNOSIS — M51369 Other intervertebral disc degeneration, lumbar region without mention of lumbar back pain or lower extremity pain: Secondary | ICD-10-CM

## 2017-10-18 DIAGNOSIS — K219 Gastro-esophageal reflux disease without esophagitis: Secondary | ICD-10-CM | POA: Insufficient documentation

## 2017-10-18 DIAGNOSIS — M48061 Spinal stenosis, lumbar region without neurogenic claudication: Secondary | ICD-10-CM | POA: Diagnosis not present

## 2017-10-18 DIAGNOSIS — M461 Sacroiliitis, not elsewhere classified: Secondary | ICD-10-CM

## 2017-10-18 DIAGNOSIS — M47818 Spondylosis without myelopathy or radiculopathy, sacral and sacrococcygeal region: Secondary | ICD-10-CM

## 2017-10-18 DIAGNOSIS — G894 Chronic pain syndrome: Secondary | ICD-10-CM | POA: Diagnosis not present

## 2017-10-18 DIAGNOSIS — M797 Fibromyalgia: Secondary | ICD-10-CM | POA: Insufficient documentation

## 2017-10-18 DIAGNOSIS — M810 Age-related osteoporosis without current pathological fracture: Secondary | ICD-10-CM

## 2017-10-18 DIAGNOSIS — E039 Hypothyroidism, unspecified: Secondary | ICD-10-CM | POA: Diagnosis not present

## 2017-10-18 DIAGNOSIS — M7061 Trochanteric bursitis, right hip: Secondary | ICD-10-CM | POA: Diagnosis not present

## 2017-10-18 DIAGNOSIS — E785 Hyperlipidemia, unspecified: Secondary | ICD-10-CM | POA: Insufficient documentation

## 2017-10-18 DIAGNOSIS — E559 Vitamin D deficiency, unspecified: Secondary | ICD-10-CM | POA: Insufficient documentation

## 2017-10-18 NOTE — Progress Notes (Signed)
Safety precautions to be maintained throughout the outpatient stay will include: orient to surroundings, keep bed in low position, maintain call bell within reach at all times, provide assistance with transfer out of bed and ambulation.  

## 2017-10-18 NOTE — Progress Notes (Signed)
[name] Name: Stacy Allen  MRN: [id number]  Referring Provider: Frazier Richards, MD  DOB: 07-15-1940  PCP: Frazier Richards, MD  DOS: 10/18/2017  Note by: Gillis Santa, MD  Service setting: Ambulatory outpatient  Specialty: Interventional Pain Management  Location: [facility] (AMB) Pain Management Facility    Patient type: Established   Primary Reason(s) for Visit: Encounter for post-procedure evaluation of chronic illness with mild to moderate exacerbation CC: Groin Pain (right) and Hip Pain (bilateral)  HPI  [name] is a 77 y.o. year old, female patient, who comes today for a post-procedure evaluation. She has Lumbar spondylosis; Lumbar degenerative disc disease; History of lumbar fusion; Age related osteoporosis; Chronic pain syndrome; and SI joint arthritis on their problem list. Her primarily concern today is the Groin Pain (right) and Hip Pain (bilateral)  Pain Assessment: Location: Left, Right Hip Radiating: groin and bursa Onset: More than a month ago Duration: Chronic pain Quality: Aching Severity: 4 /10 (self-reported pain score)  Note: Reported level is compatible with observation.                         When using our objective Pain Scale, levels between 6 and 10/10 are said to belong in an emergency room, as it progressively worsens from a 6/10, described as severely limiting, requiring emergency care not usually available at an outpatient pain management facility. At a 6/10 level, communication becomes difficult and requires great effort. Assistance to reach the emergency department may be required. Facial flushing and profuse sweating along with potentially dangerous increases in heart rate and blood pressure will be evident. Effect on ADL:   Timing: Constant Modifying factors: heat, ice, CBD salve, aspercreme, biofreeze  [name] comes in today for post-procedure evaluation after the treatment done on 09/11/2017.  Further details on both, my assessment(s), as well as the  proposed treatment plan, please see below.  Post-Procedure Assessment  09/11/2017 Procedure: Bilateral trochanteric bursa injections Pre-procedure pain score:  3/10 Post-procedure pain score: 4/10         Influential Factors: BMI: 40.42 kg/m Intra-procedural challenges: None observed.         Assessment challenges: None detected.              Reported side-effects: None.        Post-procedural adverse reactions or complications: None reported         Sedation: Please see nurses note. When no sedatives are used, the analgesic levels obtained are directly associated to the effectiveness of the local anesthetics. However, when sedation is provided, the level of analgesia obtained during the initial 1 hour following the intervention, is believed to be the result of a combination of factors. These factors may include, but are not limited to: 1. The effectiveness of the local anesthetics used. 2. The effects of the analgesic(s) and/or anxiolytic(s) used. 3. The degree of discomfort experienced by the patient at the time of the procedure. 4. The patients ability and reliability in recalling and recording the events. 5. The presence and influence of possible secondary gains and/or psychosocial factors. Reported result: Relief experienced during the 1st hour after the procedure: 0 % (Ultra-Short Term Relief)            Interpretative annotation: Unexpected result. Analgesia during this period is likely to be Local Anesthetic and/or IV Sedative (Analgesic/Anxiolytic) related.          Effects of local anesthetic: The analgesic effects attained during this period  are directly associated to the localized infiltration of local anesthetics and therefore cary significant diagnostic value as to the etiological location, or anatomical origin, of the pain. Expected duration of relief is directly dependent on the pharmacodynamics of the local anesthetic used. Long-acting (4-6 hours) anesthetics used.  Reported  result: Relief during the next 4 to 6 hour after the procedure: 0 % (Short-Term Relief)            Interpretative annotation: Unexpected result. Analgesia during this period is likely to be Local Anesthetic-related.          Long-term benefit: Defined as the period of time past the expected duration of local anesthetics (1 hour for short-acting and 4-6 hours for long-acting). With the possible exception of prolonged sympathetic blockade from the local anesthetics, benefits during this period are typically attributed to, or associated with, other factors such as analgesic sensory neuropraxia, antiinflammatory effects, or beneficial biochemical changes provided by agents other than the local anesthetics.  Reported result: Extended relief following procedure: 70 % (Long-Term Relief)            Interpretative annotation: Clinically appropriate result. Good relief. No permanent benefit expected. Inflammation plays a part in the etiology to the pain.          Current benefits: Defined as reported results that persistent at this point in time.   Analgesia: 50-75 %            Function: Somewhat improved ROM: Somewhat improved Interpretative annotation: Recurrence of symptoms. No permanent benefit expected. Effective diagnostic intervention.          Interpretation: Results would suggest a successful diagnostic intervention.                  Plan:  Repeat treatment or therapy and compare extent and duration of benefits.                Laboratory Chemistry  Inflammation Markers (CRP: Acute Phase) (ESR: Chronic Phase) No results found for: CRP, ESRSEDRATE, LATICACIDVEN                       Rheumatology Markers No results found for: RF, ANA, Rush Barer, LYMEIGGIGMAB, [facility]                      Renal Function Markers Lab Results  Component Value Date   BUN 10 09/13/2012   CREATININE 0.85 09/13/2012   GFRAA >60 11/20/2011   GFRNONAA >60 11/20/2011                              Hepatic  Function Markers Lab Results  Component Value Date   AST 15 09/13/2012   ALT 19 09/13/2012   ALBUMIN 3.5 09/13/2012   ALKPHOS 100 09/13/2012                        Electrolytes Lab Results  Component Value Date   NA 136 09/13/2012   K 3.3 (L) 09/13/2012   CL 103 09/13/2012   CALCIUM 8.6 09/13/2012                        Neuropathy Markers No results found for: VITAMINB12, FOLATE, HGBA1C, HIV                      Bone Pathology Markers No results  found for: Marveen Reeks, [id number], [id number], 25OHVITD1, 25OHVITD2, 25OHVITD3, TESTOFREE, TESTOSTERONE                       Coagulation Parameters Lab Results  Component Value Date   PLT 248 09/22/2015                        Cardiovascular Markers Lab Results  Component Value Date   CKTOTAL 54 09/13/2012   CKMB 1.0 09/13/2012   TROPONINI < 0.02 09/13/2012   HGB 11.6 (L) 09/22/2015   HCT 35.4 09/22/2015                         CA Markers No results found for: CEA, CA125, LABCA2                      Note: Lab results reviewed.  Recent Diagnostic Imaging Results  DG C-Arm 1-60 Min-No Report Fluoroscopy was utilized by the requesting physician.  No radiographic  interpretation.   Complexity Note: Imaging results reviewed. Results shared with Ms. Zumbro, using Layman's terms.                         Meds   Current Outpatient Medications:  .  acetaminophen (TYLENOL) 500 MG tablet, Take 500 mg by mouth every 6 (six) hours as needed., Disp: , Rfl:  .  albuterol (PROVENTIL HFA;VENTOLIN HFA) 108 (90 BASE) MCG/ACT inhaler, Inhale 2 puffs into the lungs., Disp: , Rfl:  .  amLODipine (NORVASC) 5 MG tablet, Take 5 mg by mouth 2 (two) times daily., Disp: , Rfl:  .  celecoxib (CELEBREX) 200 MG capsule, Take 200 mg by mouth 3 (three) times a week. prn, Disp: , Rfl:  .  citalopram (CELEXA) 40 MG tablet, Take 40 mg by mouth daily., Disp: , Rfl:  .  clonazePAM (KLONOPIN) 1 MG tablet, Take 1 mg by mouth 2 (two) times daily. As  needed for anxiety, Disp: , Rfl:  .  diclofenac sodium (VOLTAREN) 1 % GEL, Apply 2 g topically 4 (four) times daily., Disp: , Rfl:  .  enalapril (VASOTEC) 20 MG tablet, Take 20 mg by mouth daily., Disp: , Rfl:  .  ergocalciferol (VITAMIN D2) 50000 UNITS capsule, Take 50,000 Units by mouth every 30 (thirty) days., Disp: , Rfl:  .  fluticasone (VERAMYST) 27.5 MCG/SPRAY nasal spray, Place 2 sprays into the nose daily., Disp: , Rfl:  .  gabapentin (NEURONTIN) 300 MG capsule, Take 600 mg by mouth 3 (three) times daily. Two tabs to total 600 mgm by mouth 3 times daily, Disp: , Rfl:  .  levothyroxine (SYNTHROID, LEVOTHROID) 112 MCG tablet, Take 137 mcg by mouth daily before breakfast. , Disp: , Rfl:  .  loratadine (CLARITIN) 10 MG tablet, Take 1 tablet (10 mg total) by mouth daily. Take 1 tablet in the morning. As needed for itching., Disp: 30 tablet, Rfl: 0 .  meclizine (ANTIVERT) 25 MG tablet, Take 25 mg by mouth 3 (three) times daily as needed for dizziness., Disp: , Rfl:  .  metoprolol tartrate (LOPRESSOR) 25 MG tablet, Take 12.5 mg by mouth 2 (two) times daily. , Disp: , Rfl:  .  nitroGLYCERIN (NITROSTAT) 0.4 MG SL tablet, Place 0.4 mg under the tongue every 5 (five) minutes as needed for chest pain., Disp: , Rfl:  .  omeprazole (PRILOSEC) 20 MG capsule, Take 40  mg by mouth 2 (two) times daily before a meal. , Disp: , Rfl:  .  phenazopyridine (PYRIDIUM) 95 MG tablet, Take 95 mg by mouth 3 (three) times daily as needed for pain., Disp: , Rfl:  .  pravastatin (PRAVACHOL) 40 MG tablet, Take 40 mg by mouth at bedtime., Disp: , Rfl:  .  pseudoephedrine (SUDAFED) 30 MG tablet, Take 30 mg by mouth every 8 (eight) hours as needed for congestion., Disp: , Rfl:  .  risperiDONE (RISPERDAL) 0.25 MG tablet, Take 0.25 mg by mouth at bedtime., Disp: , Rfl:  .  tiZANidine (ZANAFLEX) 2 MG tablet, Take by mouth every 6 (six) hours as needed for muscle spasms., Disp: , Rfl:  .  amoxicillin (AMOXIL) 500 MG capsule, Take  500 mg by mouth 2 (two) times daily., Disp: , Rfl:  .  cetirizine (ZYRTEC) 10 MG tablet, Take 1 tablet (10 mg total) by mouth daily. If needed at night for itching not relieved by Claritin in the morning. (Patient not taking: Reported on 08/30/2017), Disp: 30 tablet, Rfl: 0 .  HYDROcodone-acetaminophen (NORCO/VICODIN) 5-325 MG tablet, Take 1 tablet by mouth every 4 (four) hours as needed for moderate pain., Disp: , Rfl:  .  predniSONE (DELTASONE) 10 MG tablet, Take 10 mg by mouth daily with breakfast., Disp: , Rfl:  .  ranitidine (ZANTAC) 150 MG capsule, Take 1 capsule (150 mg total) by mouth 2 (two) times daily. (Patient not taking: Reported on 08/02/2017), Disp: 30 capsule, Rfl: 0 .  sulfamethoxazole-trimethoprim (BACTRIM DS,SEPTRA DS) 800-160 MG tablet, Take 1 tablet by mouth 2 (two) times daily. (Patient not taking: Reported on 06/01/2017), Disp: 20 tablet, Rfl: 0  ROS  Constitutional: Denies any fever or chills Gastrointestinal: No reported hemesis, hematochezia, vomiting, or acute GI distress Musculoskeletal: Denies any acute onset joint swelling, redness, loss of ROM, or weakness Neurological: No reported episodes of acute onset apraxia, aphasia, dysarthria, agnosia, amnesia, paralysis, loss of coordination, or loss of consciousness  Allergies  Ms. Winfree is allergic to zithromax [azithromycin]; adhesive [tape]; codeine; lactose intolerance (gi); lodine [etodolac]; morphine and related; red dye; shellfish-derived products; and latex.  [place]  Drug: Ms. Un  reports that she does not use drugs. Alcohol:  reports that she does not drink alcohol. Tobacco:  reports that she has quit smoking. She has never used smokeless tobacco. Medical:  has a past medical history of Adenomatous polyps, Allergic state, Anemia, Anginal pain ([place]), Anxiety, Arthritis, Broken rib, Cancer ([place]), Chicken pox, Coronary artery disease, Depression, Eczema, Fibromyalgia, GERD (gastroesophageal reflux disease), Headache,  Hemorrhoids, History of hiatal hernia, Hypercalcemia, Hyperlipidemia, Hypertension, Hypothyroidism, Lumbar stenosis, Obesity, Peptic ulcer disease, Shortness of breath dyspnea, Sleep apnea, Vertigo, and Vitamin D deficiency. Surgical: Ms. Cothran  has a past surgical history that includes Abdominal hysterectomy; Cardiac catheterization (N/A, 04/03/2015); Back surgery (2007); Cholecystectomy; Fracture surgery (Left, 1991); Coronary angioplasty (2015); Image guided sinus surgery (Bilateral, 09/28/2015); and Esophagogastroduodenoscopy (egd) with propofol (N/A, 12/21/2016). Family: family history is not on file.  Constitutional Exam  General appearance: Well nourished, well developed, and well hydrated. In no apparent acute distress Vitals:   10/18/17 1349  BP: (!) 158/93  Pulse: 67  Resp: 18  Temp: 97.7 F (36.5 C)  TempSrc: Oral  SpO2: 100%  Weight: 228 lb 3.2 oz (103.5 kg)  Height: _0  (1.6 m)   BMI Assessment: Estimated body mass index is 40.42 kg/m as calculated from the following:   Height as of this encounter: _1  (1.6 m).  Weight as of this encounter: 228 lb 3.2 oz (103.5 kg).  BMI interpretation table: BMI level Category Range association with higher incidence of chronic pain  <18 kg/m2 Underweight   18.5-24.9 kg/m2 Ideal body weight   25-29.9 kg/m2 Overweight Increased incidence by 20%  30-34.9 kg/m2 Obese (Class I) Increased incidence by 68%  35-39.9 kg/m2 Severe obesity (Class II) Increased incidence by 136%  >40 kg/m2 Extreme obesity (Class III) Increased incidence by 254%   [name] current BMI Ideal Body weight  Body mass index is 40.42 kg/m. Ideal body weight: 52.4 kg (115 lb 8.3 oz) Adjusted ideal body weight: 72.8 kg (160 lb 9.5 oz)   BMI Readings from Last 4 Encounters:  10/18/17 40.42 kg/m  09/11/17 39.33 kg/m  08/30/17 40.03 kg/m  08/02/17 39.86 kg/m   Wt Readings from Last 4 Encounters:  10/18/17 228 lb 3.2 oz (103.5 kg)  09/11/17 222 lb (100.7 kg)   08/30/17 226 lb (102.5 kg)  08/02/17 225 lb (102.1 kg)  Psych/Mental status: Alert, oriented x 3 (person, place, & time)       Eyes: PERLA Respiratory: No evidence of acute respiratory distress  Cervical Spine Area Exam  Skin & Axial Inspection: No masses, redness, edema, swelling, or associated skin lesions Alignment: Symmetrical Functional ROM: Unrestricted ROM      Stability: No instability detected Muscle Tone/Strength: Functionally intact. No obvious neuro-muscular anomalies detected. Sensory (Neurological): Unimpaired Palpation: No palpable anomalies              Upper Extremity (UE) Exam    Side: Right upper extremity  Side: Left upper extremity  Skin & Extremity Inspection: Skin color, temperature, and hair growth are WNL. No peripheral edema or cyanosis. No masses, redness, swelling, asymmetry, or associated skin lesions. No contractures.  Skin & Extremity Inspection: Skin color, temperature, and hair growth are WNL. No peripheral edema or cyanosis. No masses, redness, swelling, asymmetry, or associated skin lesions. No contractures.  Functional ROM: Unrestricted ROM          Functional ROM: Unrestricted ROM          Muscle Tone/Strength: Functionally intact. No obvious neuro-muscular anomalies detected.  Muscle Tone/Strength: Functionally intact. No obvious neuro-muscular anomalies detected.  Sensory (Neurological): Unimpaired          Sensory (Neurological): Unimpaired          Palpation: No palpable anomalies              Palpation: No palpable anomalies              Specialized Test(s): Deferred         Specialized Test(s): Deferred          Thoracic Spine Area Exam  Skin & Axial Inspection: No masses, redness, or swelling Alignment: Symmetrical Functional ROM: Unrestricted ROM Stability: No instability detected Muscle Tone/Strength: Functionally intact. No obvious neuro-muscular anomalies detected. Sensory (Neurological): Unimpaired Muscle strength & Tone: No palpable  anomalies  Lumbar Spine Area Exam  Skin & Axial Inspection: No masses, redness, or swelling Alignment: Symmetrical Functional ROM: Unrestricted ROM       Stability: No instability detected Muscle Tone/Strength: Functionally intact. No obvious neuro-muscular anomalies detected. Sensory (Neurological): Unimpaired Palpation: No palpable anomalies       Provocative Tests: Lumbar Hyperextension and rotation test: evaluation deferred today       Lumbar Lateral bending test: evaluation deferred today       Patrick's Maneuver: Positive for bilateral S-I arthralgia  TTP overlying bilateral hip bursas Gait & Posture Assessment  Ambulation: Unassisted Gait: Relatively normal for age and body habitus Posture: WNL   Lower Extremity Exam    Side: Right lower extremity  Side: Left lower extremity  Skin & Extremity Inspection: Skin color, temperature, and hair growth are WNL. No peripheral edema or cyanosis. No masses, redness, swelling, asymmetry, or associated skin lesions. No contractures.  Skin & Extremity Inspection: Skin color, temperature, and hair growth are WNL. No peripheral edema or cyanosis. No masses, redness, swelling, asymmetry, or associated skin lesions. No contractures.  Functional ROM: Unrestricted ROM          Functional ROM: Unrestricted ROM          Muscle Tone/Strength: Functionally intact. No obvious neuro-muscular anomalies detected.  Muscle Tone/Strength: Functionally intact. No obvious neuro-muscular anomalies detected.  Sensory (Neurological): Unimpaired  Sensory (Neurological): Unimpaired  Palpation: No palpable anomalies  Palpation: No palpable anomalies   Assessment  Primary Diagnosis & Pertinent Problem List: The primary encounter diagnosis was Trochanteric bursitis of both hips. Diagnoses of SI joint arthritis, Myofascial muscle pain, Lumbar degenerative disc disease, Chronic pain syndrome, History of lumbar fusion, and Age related osteoporosis, unspecified  pathological fracture presence were also pertinent to this visit.  Status Diagnosis  Responding Responding Persistent 1. Trochanteric bursitis of both hips   2. SI joint arthritis   3. Myofascial muscle pain   4. Lumbar degenerative disc disease   5. Chronic pain syndrome   6. History of lumbar fusion   7. Age related osteoporosis, unspecified pathological fracture presence      77 year old female with a history of chronic hip, groin, axial low back pain who presents for follow-up status post bilateral trochanteric bursa hip injections.  These were done on 09/11/2017.  Patient notes improvement in her lateral hip pain and is not as tender to touch as it was before.  She does note return of her pain symptoms along her right hip bursa and is interested in another injection.  Patient is also complaining of persistent, recurring groin pain which could be related to SI joint arthritis.  [name] previous SI joint injection was on 08/11/2017.  Hip bursa injections as well as bilateral we discussed repeating bilateral SI joint injections to help out with her trochanteric bursitis as well as SI joint arthritis resulting in hip and groin pain.  In regards to medication management, would like to consider the patient for a short trial of NSAID therapy.  [name] previous metabolic panel was over 3 years ago so we will repeat complete metabolic panel and I would also like to look at patient's vitamin D, B12, magnesium as deficiencies in these vitamins and minerals can result in chronic pain and chronic fatigue.  Plan: -Bilateral SI joint injection for SI joint arthritis, #2 -Bilateral hip bursa injection for trochanteric bursitis, #2 -Complete metabolic panel, magnesium level, vitamin D level, vitamin B12 evaluation and management of her chronic pain syndrome.   Plan of Care   Lab-work, procedure(s), and/or referral(s): Orders Placed This Encounter  Procedures  . SACROILIAC JOINT INJECTION  . HIP  INJECTION  . Comprehensive metabolic panel  . Magnesium  . 25-Hydroxyvitamin D Lcms D2+D3  . Vitamin B12   Interventional history: 09/11/2017: Bilateral hip trochanteric bursa injections #1 08/11/2017: Bilateral L5 facet medial branch nerve block, bilateral SI joint injection  Provider-requested follow-up: Return in about 1 week (around 10/25/2017) for Procedure. Time Note: Greater than 50% of the 25 minute(s) of face-to-face time spent  with Ms. Whisnant, was spent in counseling/coordination of care regarding: Ms. Sacco primary cause of pain, the results of her recent test(s), the significance of each one oth the test(s) anomalies and it's corresponding characteristic pain pattern(s), the treatment plan, treatment alternatives, the risks and possible complications of proposed treatment, going over the informed consent, the results, interpretation and significance of  her recent diagnostic interventional treatment(s), realistic expectations and the goals of pain management (increased in functionality).  No future appointments.  Primary Care Physician: Frazier Richards, MD Location: [facility] Note by: Gillis Santa, M.D Date: 10/18/2017; Time: 2:51 PM  There are no Patient Instructions on file for this visit.

## 2017-10-23 LAB — COMPREHENSIVE METABOLIC PANEL
A/G RATIO: 1.3 (ref 1.2–2.2)
ALT: 16 IU/L (ref 0–32)
AST: 17 IU/L (ref 0–40)
Albumin: 4.1 g/dL (ref 3.5–4.8)
Alkaline Phosphatase: 90 IU/L (ref 39–117)
BILIRUBIN TOTAL: 0.6 mg/dL (ref 0.0–1.2)
BUN / CREAT RATIO: 23 (ref 12–28)
BUN: 18 mg/dL (ref 8–27)
CALCIUM: 9.1 mg/dL (ref 8.7–10.3)
CO2: 23 mmol/L (ref 20–29)
Chloride: 93 mmol/L — ABNORMAL LOW (ref 96–106)
Creatinine, Ser: 0.78 mg/dL (ref 0.57–1.00)
GFR calc non Af Amer: 75 mL/min/{1.73_m2} (ref >59)
GFR, EST AFRICAN AMERICAN: 86 mL/min/{1.73_m2} (ref >59)
GLOBULIN, TOTAL: 3.1 g/dL (ref 1.5–4.5)
Glucose: 80 mg/dL (ref 65–99)
POTASSIUM: 4.4 mmol/L (ref 3.5–5.2)
SODIUM: 131 mmol/L — AB (ref 134–144)
TOTAL PROTEIN: 7.2 g/dL (ref 6.0–8.5)

## 2017-10-23 LAB — 25-HYDROXY VITAMIN D LCMS D2+D3
25-Hydroxy, Vitamin D-2: 2.1 ng/mL
25-Hydroxy, Vitamin D-3: 53 ng/mL
25-Hydroxy, Vitamin D: 55 ng/mL

## 2017-10-23 LAB — MAGNESIUM: Magnesium: 2.1 mg/dL (ref 1.6–2.3)

## 2017-10-23 LAB — VITAMIN B12: Vitamin B-12: 464 pg/mL (ref 232–1245)

## 2017-11-01 ENCOUNTER — Ambulatory Visit (HOSPITAL_BASED_OUTPATIENT_CLINIC_OR_DEPARTMENT_OTHER): Payer: Medicare HMO | Admitting: Student in an Organized Health Care Education/Training Program

## 2017-11-01 ENCOUNTER — Encounter: Payer: Self-pay | Admitting: Student in an Organized Health Care Education/Training Program

## 2017-11-01 ENCOUNTER — Other Ambulatory Visit: Payer: Self-pay

## 2017-11-01 ENCOUNTER — Ambulatory Visit
Admission: RE | Admit: 2017-11-01 | Discharge: 2017-11-01 | Disposition: A | Discharge status: Home / Self Care | Payer: Medicare HMO | Source: Ambulatory Visit | Attending: Student in an Organized Health Care Education/Training Program | Admitting: Student in an Organized Health Care Education/Training Program

## 2017-11-01 VITALS — BP 170/88 | HR 60 | Temp 97.7°F | Resp 16 | Ht 65.0 in | Wt 225.0 lb

## 2017-11-01 DIAGNOSIS — M47898 Other spondylosis, sacral and sacrococcygeal region: Secondary | ICD-10-CM | POA: Insufficient documentation

## 2017-11-01 DIAGNOSIS — M7062 Trochanteric bursitis, left hip: Secondary | ICD-10-CM

## 2017-11-01 DIAGNOSIS — M47818 Spondylosis without myelopathy or radiculopathy, sacral and sacrococcygeal region: Secondary | ICD-10-CM | POA: Diagnosis not present

## 2017-11-01 DIAGNOSIS — M7061 Trochanteric bursitis, right hip: Secondary | ICD-10-CM | POA: Diagnosis not present

## 2017-11-01 DIAGNOSIS — Z79890 Hormone replacement therapy (postmenopausal): Secondary | ICD-10-CM | POA: Diagnosis not present

## 2017-11-01 MED ORDER — ROPIVACAINE HCL 2 MG/ML IJ SOLN
INTRAMUSCULAR | Status: AC
  Filled 2017-11-01: qty 20

## 2017-11-01 MED ORDER — DEXAMETHASONE SODIUM PHOSPHATE 10 MG/ML IJ SOLN: 10.0000 mg | Freq: Once | INTRAMUSCULAR | Status: DC

## 2017-11-01 MED ORDER — LIDOCAINE HCL (PF) 1 % IJ SOLN
INTRAMUSCULAR | Status: AC
  Filled 2017-11-01: qty 10

## 2017-11-01 MED ORDER — LIDOCAINE HCL (PF) 1 % IJ SOLN: 10.0000 mL | Freq: Once | INTRAMUSCULAR | Status: DC

## 2017-11-01 MED ORDER — ROPIVACAINE HCL 2 MG/ML IJ SOLN: 10.0000 mL | Freq: Once | INTRAMUSCULAR | Status: DC

## 2017-11-01 MED ORDER — DEXAMETHASONE SODIUM PHOSPHATE 10 MG/ML IJ SOLN
INTRAMUSCULAR | Status: AC
  Filled 2017-11-01: qty 2

## 2017-11-01 NOTE — Progress Notes (Signed)
[name] Name: Stacy Allen  MRN: [id number]  Referring Provider: Gillis Santa, MD  DOB: [birth year]  PCP: Frazier Richards, MD  DOS: 11/01/2017  Note by: Gillis Santa, MD  Service setting: Ambulatory outpatient  Specialty: Interventional Pain Management  Patient type: Established  Location: [facility] (AMB) Pain Management Facility  Visit type: Interventional Procedure   Primary Reason for Visit: Interventional Pain Management Treatment. CC: Procedure ( SI joint injection and hip injections ); Back Pain; and Hip Pain  Procedure #1:  Anesthesia, Analgesia, Anxiolysis:  Type: Trochanteric Bursa Injection #2  Primary Purpose: Diagnostic/Therapeutric Region: Upper (proximal) Femoral Region Level: Hip Joint Target Area: Superior aspect of the hip joint cavity, going thru the superior portion of the capsular ligament. Approach: Posterolateral approach Laterality: Bilateral Position: Prone  Type: Local Anesthesia Indication(s): Analgesia         Local Anesthetic: Lidocaine 1-2% Route: Infiltration ([place]/IM) IV Access: Declined Sedation: Declined    Indications: Bilateral trochanteric bursitis  Pain Score: Pre-procedure: 4 /10 Post-procedure: 4 /10  Pre-op Assessment:  Stacy Allen is a 77 y.o. (year old), female patient, seen today for interventional treatment. She  has a past surgical history that includes Abdominal hysterectomy; Cardiac catheterization (N/A, 04/03/2015); Back surgery (2007); Cholecystectomy; Fracture surgery (Left, 1991); Coronary angioplasty (2015); Image guided sinus surgery (Bilateral, 09/28/2015); and Esophagogastroduodenoscopy (egd) with propofol (N/A, 12/21/2016). Stacy Allen has a current medication list which includes the following prescription(s): acetaminophen, albuterol, amlodipine, celecoxib, cetirizine, citalopram, clonazepam, diclofenac sodium, enalapril, ergocalciferol, fluticasone, gabapentin, levothyroxine, loratadine, meclizine, metoprolol tartrate, nitroglycerin,  omeprazole, phenazopyridine, ranitidine, tizanidine, amoxicillin, hydrocodone-acetaminophen, pravastatin, prednisone, pseudoephedrine, risperidone, and sulfamethoxazole-trimethoprim, and the following Facility-Administered Medications: dexamethasone, dexamethasone, lidocaine (pf), and ropivacaine (pf) 2 mg/ml (0.2%). Her primarily concern today is the Procedure ( SI joint injection and hip injections ); Back Pain; and Hip Pain  Initial Vital Signs:  Pulse Rate: 60 Temp: 97.7 F (36.5 C) Resp: 18 BP: (!) 162/95 SpO2: 100 %  BMI: Estimated body mass index is 37.44 kg/m as calculated from the following:   Height as of this encounter: 5\' 5"  (1.651 m).   Weight as of this encounter: 225 lb (102.1 kg).  Risk Assessment: Allergies: Reviewed. She is allergic to zithromax [azithromycin]; adhesive [tape]; codeine; lactose intolerance (gi); lodine [etodolac]; morphine and related; red dye; shellfish-derived products; and latex.  Allergy Precautions: None required Coagulopathies: Reviewed. None identified.  Blood-thinner therapy: None at this time Active Infection(s): Reviewed. None identified. Stacy Allen is afebrile  Site Confirmation: Stacy Allen was asked to confirm the procedure and laterality before marking the site Procedure checklist: Completed Consent: Before the procedure and under the influence of no sedative(s), amnesic(s), or anxiolytics, the patient was informed of the treatment options, risks and possible complications. To fulfill our ethical and legal obligations, as recommended by the American Medical Association's Code of Ethics, I have informed the patient of my clinical impression; the nature and purpose of the treatment or procedure; the risks, benefits, and possible complications of the intervention; the alternatives, including doing nothing; the risk(s) and benefit(s) of the alternative treatment(s) or procedure(s); and the risk(s) and benefit(s) of doing nothing. The patient was  provided information about the general risks and possible complications associated with the procedure. These may include, but are not limited to: failure to achieve desired goals, infection, bleeding, organ or nerve damage, allergic reactions, paralysis, and death. In addition, the patient was informed of those risks and complications associated to the procedure, such as failure to decrease pain; infection; bleeding; organ  or nerve damage with subsequent damage to sensory, motor, and/or autonomic systems, resulting in permanent pain, numbness, and/or weakness of one or several areas of the body; allergic reactions; (i.e.: anaphylactic reaction); and/or death. Furthermore, the patient was informed of those risks and complications associated with the medications. These include, but are not limited to: allergic reactions (i.e.: anaphylactic or anaphylactoid reaction(s)); adrenal axis suppression; blood sugar elevation that in diabetics may result in ketoacidosis or comma; water retention that in patients with history of congestive heart failure may result in shortness of breath, pulmonary edema, and decompensation with resultant heart failure; weight gain; swelling or edema; medication-induced neural toxicity; particulate matter embolism and blood vessel occlusion with resultant organ, and/or nervous system infarction; and/or aseptic necrosis of one or more joints. Finally, the patient was informed that Medicine is not an exact science; therefore, there is also the possibility of unforeseen or unpredictable risks and/or possible complications that may result in a catastrophic outcome. The patient indicated having understood very clearly. We have given the patient no guarantees and we have made no promises. Enough time was given to the patient to ask questions, all of which were answered to the patient's satisfaction. Stacy Allen has indicated that she wanted to continue with the procedure. Attestation: I, the  ordering provider, attest that I have discussed with the patient the benefits, risks, side-effects, alternatives, likelihood of achieving goals, and potential problems during recovery for the procedure that I have provided informed consent. Date  Time: 11/01/2017 10:37 AM  Pre-Procedure Preparation:  Monitoring: As per clinic protocol. Respiration, ETCO2, SpO2, BP, heart rate and rhythm monitor placed and checked for adequate function Safety Precautions: Patient was assessed for positional comfort and pressure points before starting the procedure. Time-out: I initiated and conducted the "Time-out" before starting the procedure, as per protocol. The patient was asked to participate by confirming the accuracy of the "Time Out" information. Verification of the correct person, site, and procedure were performed and confirmed by me, the nursing staff, and the patient. "Time-out" conducted as per Joint Commission's Universal Protocol (UP.01.01.01). Time: 1121  Description of Procedure Process:  Area Prepped: Entire Posterolateral hip area. Prepping solution: ChloraPrep (2% chlorhexidine gluconate and 70% isopropyl alcohol) Safety Precautions: Aspiration looking for blood return was conducted prior to all injections. At no point did we inject any substances, as a needle was being advanced. No attempts were made at seeking any paresthesias. Safe injection practices and needle disposal techniques used. Medications properly checked for expiration dates. SDV (single dose vial) medications used. Description of the Procedure: Protocol guidelines were followed. The patient was placed in position over the procedure table. The target area was identified and the area prepped in the usual manner. Skin desensitized using vapocoolant spray. Skin & deeper tissues infiltrated with local anesthetic. Appropriate amount of time allowed to pass for local anesthetics to take effect. The procedure needles were then advanced to the  target area. Proper needle placement secured. Negative aspiration confirmed. Solution injected in intermittent fashion, asking for systemic symptoms every 0.5cc of injectate. The needles were then removed and the area cleansed, making sure to leave some of the prepping solution back to take advantage of its long term bactericidal properties. Vitals:   11/01/17 1125 11/01/17 1130 11/01/17 1135 11/01/17 1141  BP: (!) 157/91 (!) 170/99 (!) 171/89 (!) 170/88  Pulse:      Resp: 17 16 16 16   Temp:      SpO2: 97% 97% 98% 98%  Weight:  Height:        Start Time: 1121 hrs. End Time: 1140 hrs.  Materials:  Needle(s) Type: Regular needle Gauge: 22G Length: 3.5-in Medication(s): Please see orders for medications and dosing details. 6 cc solution made of 5 cc of 0.2% ropivacaine, 1 cc of Decadron 10 mg/cc.  3 cc injected in and around each trochanteric bursa. Imaging Guidance (Non-Spinal):  Type of Imaging Technique: Fluoroscopy Guidance (Non-Spinal) Indication(s): Assistance in needle guidance and placement for procedures requiring needle placement in or near specific anatomical locations not easily accessible without such assistance. Exposure Time: Please see nurses notes. Contrast: None used. Fluoroscopic Guidance: I was personally present during the use of fluoroscopy. "Tunnel Vision Technique" used to obtain the best possible view of the target area. Parallax error corrected before commencing the procedure. "Direction-depth-direction" technique used to introduce the needle under continuous pulsed fluoroscopy. Once target was reached, antero-posterior, oblique, and lateral fluoroscopic projection used confirm needle placement in all planes. Images permanently stored in EMR. Interpretation: I personally interpreted the imaging intraoperatively. Adequate needle placement confirmed in multiple planes. Appropriate spread of contrast into desired area was observed. No evidence of afferent or efferent  intravascular uptake. Permanent images saved into the patient's record.  Antibiotic Prophylaxis:   Anti-infectives (From admission, onward)   None     Indication(s): None identified  Post-operative Assessment:  Post-procedure Vital Signs:  Pulse Rate: 60 Temp: 97.7 F (36.5 C) Resp: 16 BP: (!) 170/88 SpO2: 98 %  EBL: None  Complications: No immediate post-treatment complications observed by team, or reported by patient.  Note: The patient tolerated the entire procedure well. A repeat set of vitals were taken after the procedure and the patient was kept under observation following institutional policy, for this type of procedure. Post-procedural neurological assessment was performed, showing return to baseline, prior to discharge. The patient was provided with post-procedure discharge instructions, including a section on how to identify potential problems. Should any problems arise concerning this procedure, the patient was given instructions to immediately contact us, at any time, without hesitation. In any case, we plan to contact the patient by telephone for a follow-up status report regarding this interventional procedure.  Comments:  No additional relevant information. 5 out of 5 strength bilateral lower extremity: Plantar flexion, dorsiflexion, knee flexion, knee extension.  Plan of Care   Imaging Orders     DG C-Arm 1-60 Min-No Report Procedure Orders    No procedure(s) ordered today    Medications ordered for procedure: Meds ordered this encounter  Medications  . lidocaine (PF) (XYLOCAINE) 1 % injection 10 mL  . ropivacaine (PF) 2 mg/mL (0.2%) (NAROPIN) injection 10 mL  . dexamethasone (DECADRON) injection 10 mg  . dexamethasone (DECADRON) injection 10 mg   Medications administered: Dwana Curd had no medications administered during this visit.  See the medical record for exact dosing, route, and time of administration.  New Prescriptions   No medications  on file   Disposition: Discharge home  Discharge Date & Time: 11/01/2017; 1150 hrs.   Physician-requested Follow-up: Return in about 1 month (around 11/29/2017) for Post Procedure Evaluation.  No future appointments. Primary Care Physician: Frazier Richards, MD Location: [facility] Note by: Gillis Santa, MD Date: 11/01/2017; Time: 2:48 PM  Disclaimer:  Medicine is not an exact science. The only guarantee in medicine is that nothing is guaranteed. It is important to note that the decision to proceed with this intervention was based on the information collected from  the patient. The Data and conclusions were drawn from the patient's questionnaire, the interview, and the physical examination. Because the information was provided in large part by the patient, it cannot be guaranteed that it has not been purposely or unconsciously manipulated. Every effort has been made to obtain as much relevant data as possible for this evaluation. It is important to note that the conclusions that lead to this procedure are derived in large part from the available data. Always take into account that the treatment will also be dependent on availability of resources and existing treatment guidelines, considered by other Pain Management Practitioners as being common knowledge and practice, at the time of the intervention. For Medico-Legal purposes, it is also important to point out that variation in procedural techniques and pharmacological choices are the acceptable norm. The indications, contraindications, technique, and results of the above procedure should only be interpreted and judged by a Board-Certified Interventional Pain Specialist with extensive familiarity and expertise in the same exact procedure and technique.

## 2017-11-01 NOTE — Patient Instructions (Signed)
Sacroiliac (SI) Joint Injection Patient Information  Description: The sacroiliac joint connects the scrum (very low back and tailbone) to the ilium (a pelvic bone which also forms half of the hip joint).  Normally this joint experiences very little motion.  When this joint becomes inflamed or unstable low back and or hip and pelvis pain may result.  Injection of this joint with local anesthetics (numbing medicines) and steroids can provide diagnostic information and reduce pain.  This injection is performed with the aid of x-ray guidance into the tailbone area while you are lying on your stomach.   You may experience an electrical sensation down the leg while this is being done.  You may also experience numbness.  We also may ask if we are reproducing your normal pain during the injection.  Conditions which may be treated SI injection:   Low back, buttock, hip or leg pain  Preparation for the Injection:  1. Do not eat any solid food or dairy products within 8 hours of your appointment.  2. You may drink clear liquids up to 3 hours before appointment.  Clear liquids include water, black coffee, juice or soda.  No milk or cream please. 3. You may take your regular medications, including pain medications with a sip of water before your appointment.  Diabetics should hold regular insulin (if take separately) and take 1/2 normal NPH dose the morning of the procedure.  Carry some sugar containing items with you to your appointment. 4. A driver must accompany you and be prepared to drive you home after your procedure. 5. Bring all of your current medications with you. 6. An IV may be inserted and sedation may be given at the discretion of the physician. 7. A blood pressure cuff, EKG and other monitors will often be applied during the procedure.  Some patients may need to have extra oxygen administered for a short period.  8. You will be asked to provide medical information, including your allergies,  prior to the procedure.  We must know immediately if you are taking blood thinners (like Coumadin/Warfarin) or if you are allergic to IV iodine contrast (dye).  We must know if you could possible be pregnant.  Possible side effects:   Bleeding from needle site  Infection (rare, may require surgery)  Nerve injury (rare)  Numbness & tingling (temporary)  A brief convulsion or seizure  Light-headedness (temporary)  Pain at injection site (several days)  Decreased blood pressure (temporary)  Weakness in the leg (temporary)   Call if you experience:   New onset weakness or numbness of an extremity below the injection site that last more than 8 hours.  Hives or difficulty breathing ( go to the emergency room)  Inflammation or drainage at the injection site  Any new symptoms which are concerning to you  Please note:  Although the local anesthetic injected can often make your back/ hip/ buttock/ leg feel good for several hours after the injections, the pain will likely return.  It takes 3-7 days for steroids to work in the sacroiliac area.  You may not notice any pain relief for at least that one week.  If effective, we will often do a series of three injections spaced 3-6 weeks apart to maximally decrease your pain.  After the initial series, we generally will wait some months before a repeat injection of the same type.  If you have any questions, please call [phone] [facility]  Pain Management Discharge  Instructions  General Discharge Instructions :  If you need to reach your doctor call: Monday-Friday 8:00 am - 4:00 pm at [phone] or toll free [phone].  After clinic hours [phone] to have operator reach doctor.  Bring all of your medication bottles to all your appointments in the pain clinic.  To cancel or reschedule your appointment with Pain Management please remember to call 24 hours in advance to avoid a  fee.  Refer to the educational materials which you have been given on: General Risks, I had my Procedure. Discharge Instructions, Post Sedation.  Post Procedure Instructions:  The drugs you were given will stay in your system until tomorrow, so for the next 24 hours you should not drive, make any legal decisions or drink any alcoholic beverages.  You may eat anything you prefer, but it is better to start with liquids then soups and crackers, and gradually work up to solid foods.  Please notify your doctor immediately if you have any unusual bleeding, trouble breathing or pain that is not related to your normal pain.  Depending on the type of procedure that was done, some parts of your body may feel week and/or numb.  This usually clears up by tonight or the next day.  Walk with the use of an assistive device or accompanied by an adult for the 24 hours.  You may use ice on the affected area for the first 24 hours.  Put ice in a Ziploc bag and cover with a towel and place against area 15 minutes on 15 minutes off.  You may switch to heat after 24 hours. 

## 2017-11-01 NOTE — Progress Notes (Signed)
Safety precautions to be maintained throughout the outpatient stay will include: orient to surroundings, keep bed in low position, maintain call bell within reach at all times, provide assistance with transfer out of bed and ambulation.  

## 2017-11-01 NOTE — Progress Notes (Signed)
[name] Name: Stacy Allen  MRN: [id number]  Referring Provider: Gillis Santa, MD  DOB: 1941-04-20  PCP: Frazier Richards, MD  DOS: 11/01/2017  Note by: Gillis Santa, MD  Service setting: Ambulatory outpatient  Specialty: Interventional Pain Management  Patient type: Established  Location: [facility] (AMB) Pain Management Facility  Visit type: Interventional Procedure   Primary Reason for Visit: Interventional Pain Management Treatment. CC: Procedure ( SI joint injection and hip injections ); Back Pain; and Hip Pain  Procedure:  Anesthesia, Analgesia, Anxiolysis:   Procedure #2: Type: Therapeutic Sacroiliac Joint Block #3  Region: Posterior Lumbosacral Level: PIIS (Posterior Inferior Iliac Spine) Sacroiliac Joint Laterality: Bilateral  Type: Local Anesthesia Local Anesthetic: Lidocaine 1% Route: Infiltration ([place]/IM) IV Access: Declined Sedation: Meaningful verbal contact was maintained at all times during the procedure  Indication(s): Analgesia and Anxiety   Indications: 1. SI joint arthritis    Pain Score: Pre-procedure: 4 /10 Post-procedure: 4 /10  Pre-op Assessment:  Stacy Allen is a 77 y.o. (year old), female patient, seen today for interventional treatment. She  has a past surgical history that includes Abdominal hysterectomy; Cardiac catheterization (N/A, 04/03/2015); Back surgery (2007); Cholecystectomy; Fracture surgery (Left, 1991); Coronary angioplasty (2015); Image guided sinus surgery (Bilateral, 09/28/2015); and Esophagogastroduodenoscopy (egd) with propofol (N/A, 12/21/2016). Stacy Allen has a current medication list which includes the following prescription(s): acetaminophen, albuterol, amlodipine, celecoxib, cetirizine, citalopram, clonazepam, diclofenac sodium, enalapril, ergocalciferol, fluticasone, gabapentin, levothyroxine, loratadine, meclizine, metoprolol tartrate, nitroglycerin, omeprazole, phenazopyridine, ranitidine, tizanidine, amoxicillin, hydrocodone-acetaminophen,  pravastatin, prednisone, pseudoephedrine, risperidone, and sulfamethoxazole-trimethoprim, and the following Facility-Administered Medications: dexamethasone, dexamethasone, lidocaine (pf), and ropivacaine (pf) 2 mg/ml (0.2%). Her primarily concern today is the Procedure ( SI joint injection and hip injections ); Back Pain; and Hip Pain  Initial Vital Signs:  Pulse Rate: 60 Temp: 97.7 F (36.5 C) Resp: 18 BP: (!) 162/95 SpO2: 100 %  BMI: Estimated body mass index is 37.44 kg/m as calculated from the following:   Height as of this encounter: 5\' 5"  (1.651 m).   Weight as of this encounter: 225 lb (102.1 kg).  Risk Assessment: Allergies: Reviewed. She is allergic to zithromax [azithromycin]; adhesive [tape]; codeine; lactose intolerance (gi); lodine [etodolac]; morphine and related; red dye; shellfish-derived products; and latex.  Allergy Precautions: None required Coagulopathies: Reviewed. None identified.  Blood-thinner therapy: None at this time Active Infection(s): Reviewed. None identified. Stacy Allen is afebrile  Site Confirmation: Stacy Allen was asked to confirm the procedure and laterality before marking the site Procedure checklist: Completed Consent: Before the procedure and under the influence of no sedative(s), amnesic(s), or anxiolytics, the patient was informed of the treatment options, risks and possible complications. To fulfill our ethical and legal obligations, as recommended by the American Medical Association's Code of Ethics, I have informed the patient of my clinical impression; the nature and purpose of the treatment or procedure; the risks, benefits, and possible complications of the intervention; the alternatives, including doing nothing; the risk(s) and benefit(s) of the alternative treatment(s) or procedure(s); and the risk(s) and benefit(s) of doing nothing. The patient was provided information about the general risks and possible complications associated with the  procedure. These may include, but are not limited to: failure to achieve desired goals, infection, bleeding, organ or nerve damage, allergic reactions, paralysis, and death. In addition, the patient was informed of those risks and complications associated to Spine-related procedures, such as failure to decrease pain; infection (i.e.: Meningitis, epidural or intraspinal abscess); bleeding (i.e.: epidural hematoma, subarachnoid hemorrhage, or any other  type of intraspinal or peri-dural bleeding); organ or nerve damage (i.e.: Any type of peripheral nerve, nerve root, or spinal cord injury) with subsequent damage to sensory, motor, and/or autonomic systems, resulting in permanent pain, numbness, and/or weakness of one or several areas of the body; allergic reactions; (i.e.: anaphylactic reaction); and/or death. Furthermore, the patient was informed of those risks and complications associated with the medications. These include, but are not limited to: allergic reactions (i.e.: anaphylactic or anaphylactoid reaction(s)); adrenal axis suppression; blood sugar elevation that in diabetics may result in ketoacidosis or comma; water retention that in patients with history of congestive heart failure may result in shortness of breath, pulmonary edema, and decompensation with resultant heart failure; weight gain; swelling or edema; medication-induced neural toxicity; particulate matter embolism and blood vessel occlusion with resultant organ, and/or nervous system infarction; and/or aseptic necrosis of one or more joints. Finally, the patient was informed that Medicine is not an exact science; therefore, there is also the possibility of unforeseen or unpredictable risks and/or possible complications that may result in a catastrophic outcome. The patient indicated having understood very clearly. We have given the patient no guarantees and we have made no promises. Enough time was given to the patient to ask questions, all of  which were answered to the patient's satisfaction. Stacy Allen has indicated that she wanted to continue with the procedure. Attestation: I, the ordering provider, attest that I have discussed with the patient the benefits, risks, side-effects, alternatives, likelihood of achieving goals, and potential problems during recovery for the procedure that I have provided informed consent. Date: 11/01/2017  Time: N/A  Pre-Procedure Preparation:  Monitoring: As per clinic protocol. Respiration, ETCO2, SpO2, BP, heart rate and rhythm monitor placed and checked for adequate function Safety Precautions: Patient was assessed for positional comfort and pressure points before starting the procedure. Time-out: I initiated and conducted the "Time-out" before starting the procedure, as per protocol. The patient was asked to participate by confirming the accuracy of the "Time Out" information. Verification of the correct person, site, and procedure were performed and confirmed by me, the nursing staff, and the patient. "Time-out" conducted as per Joint Commission's Universal Protocol (UP.01.01.01). "Time-out" Date & Time: 11/01/2017; 1121 hrs.  Description of Procedure # 2 Process:   Position: Prone Target Area: For lower sacroiliac joint block(s), the target is the inferior and posterior margin of the sacroiliac joint. Approach: Ipsilateral approach. Area Prepped: Entire Posterior Lumbosacral Region Prepping solution: ChloraPrep (2% chlorhexidine gluconate and 70% isopropyl alcohol) Safety Precautions: Aspiration looking for blood return was conducted prior to all injections. At no point did we inject any substances, as a needle was being advanced. No attempts were made at seeking any paresthesias. Safe injection practices and needle disposal techniques used. Medications properly checked for expiration dates. SDV (single dose vial) medications used. Description of the Procedure: Protocol guidelines were followed. The  patient was placed in position over the fluoroscopy table. The target area was identified and the area prepped in the usual manner. Skin desensitized using vapocoolant spray. Skin & deeper tissues infiltrated with local anesthetic. Appropriate amount of time allowed to pass for local anesthetics to take effect. The procedure needle was advanced under fluoroscopic guidance into the sacroiliac joint until a firm endpoint was obtained. Proper needle placement secured. Negative aspiration confirmed. Solution injected in intermittent fashion, asking for systemic symptoms every 0.5cc of injectate. The needles were then removed and the area cleansed, making sure to leave some of the prepping solution back  to take advantage of its long term bactericidal properties. Vitals:   11/01/17 1125 11/01/17 1130 11/01/17 1135 11/01/17 1141  BP: (!) 157/91 (!) 170/99 (!) 171/89 (!) 170/88  Pulse:      Resp: 17 16 16 16   Temp:      SpO2: 97% 97% 98% 98%  Weight:      Height:        End Time: 1140 hrs. Materials:  Needle(s) Type: Regular needle Gauge: 22G Length: 3.5-in Medication(s): Please see orders for medications and dosing details. 10 cc solution made of 9 cc 0.2% ropivacaine, 1 cc of Decadron 10 mg/cc.  2.5 cc injected intra-articular, 2.5 cc injected periarticular in each SI joint.   Imaging Guidance (Spinal):  Type of Imaging Technique: Fluoroscopy Guidance (Spinal) Indication(s): Assistance in needle guidance and placement for procedures requiring needle placement in or near specific anatomical locations not easily accessible without such assistance. Exposure Time: Please see nurses notes. Contrast: None used. Fluoroscopic Guidance: I was personally present during the use of fluoroscopy. "Tunnel Vision Technique" used to obtain the best possible view of the target area. Parallax error corrected before commencing the procedure. "Direction-depth-direction" technique used to introduce the needle under  continuous pulsed fluoroscopy. Once target was reached, antero-posterior, oblique, and lateral fluoroscopic projection used confirm needle placement in all planes. Images permanently stored in EMR. Interpretation: No contrast injected. I personally interpreted the imaging intraoperatively. Adequate needle placement confirmed in multiple planes. Permanent images saved into the patient's record.  Antibiotic Prophylaxis:   Antibiotics Given (last 72 hours)    None     Indication(s): None identified  Post-operative Assessment:  Post-procedure Vital Signs:  Pulse Rate: 60 Temp: 97.7 F (36.5 C) Resp: 16 BP: (!) 170/88 SpO2: 98 %  EBL: None  Complications: No immediate post-treatment complications observed by team, or reported by patient.  Note: The patient tolerated the entire procedure well. A repeat set of vitals were taken after the procedure and the patient was kept under observation following institutional policy, for this type of procedure. Post-procedural neurological assessment was performed, showing return to baseline, prior to discharge. The patient was provided with post-procedure discharge instructions, including a section on how to identify potential problems. Should any problems arise concerning this procedure, the patient was given instructions to immediately contact us, at any time, without hesitation. In any case, we plan to contact the patient by telephone for a follow-up status report regarding this interventional procedure.  Comments:  No additional relevant information. 5 out of 5 strength bilateral lower extremity: Plantar flexion, dorsiflexion, knee flexion, knee extension.  Plan of Care    Imaging Orders     DG C-Arm 1-60 Min-No Report Procedure Orders    No procedure(s) ordered today    Medications ordered for procedure: Meds ordered this encounter  Medications  . lidocaine (PF) (XYLOCAINE) 1 % injection 10 mL  . ropivacaine (PF) 2 mg/mL (0.2%) (NAROPIN)  injection 10 mL  . dexamethasone (DECADRON) injection 10 mg  . dexamethasone (DECADRON) injection 10 mg   Medications administered: Dwana Curd had no medications administered during this visit.  See the medical record for exact dosing, route, and time of administration.  New Prescriptions   No medications on file   Disposition: Discharge home  Discharge Date & Time: 11/01/2017; 1150 hrs.   Physician-requested Follow-up: Return in about 1 month (around 11/29/2017) for Post Procedure Evaluation.  No future appointments. Primary Care Physician: Frazier Richards, MD Location: [facility]  Note by: Gillis Santa, MD Date: 11/01/2017; Time: 2:54 PM  Disclaimer:  Medicine is not an Chief Strategy Officer. The only guarantee in medicine is that nothing is guaranteed. It is important to note that the decision to proceed with this intervention was based on the information collected from the patient. The Data and conclusions were drawn from the patient's questionnaire, the interview, and the physical examination. Because the information was provided in large part by the patient, it cannot be guaranteed that it has not been purposely or unconsciously manipulated. Every effort has been made to obtain as much relevant data as possible for this evaluation. It is important to note that the conclusions that lead to this procedure are derived in large part from the available data. Always take into account that the treatment will also be dependent on availability of resources and existing treatment guidelines, considered by other Pain Management Practitioners as being common knowledge and practice, at the time of the intervention. For Medico-Legal purposes, it is also important to point out that variation in procedural techniques and pharmacological choices are the acceptable norm. The indications, contraindications, technique, and results of the above procedure should only be interpreted and  judged by a Board-Certified Interventional Pain Specialist with extensive familiarity and expertise in the same exact procedure and technique.

## 2017-11-02 ENCOUNTER — Encounter: Payer: Self-pay | Admitting: Emergency Medicine

## 2017-11-02 ENCOUNTER — Ambulatory Visit
Admission: EM | Admit: 2017-11-02 | Discharge: 2017-11-02 | Disposition: A | Discharge status: Home / Self Care | Payer: Medicare HMO | Attending: Family Medicine | Admitting: Family Medicine

## 2017-11-02 ENCOUNTER — Other Ambulatory Visit: Payer: Self-pay

## 2017-11-02 DIAGNOSIS — G44201 Tension-type headache, unspecified, intractable: Secondary | ICD-10-CM

## 2017-11-02 MED ORDER — NAPROXEN 500 MG PO TABS: 500.0000 mg | ORAL_TABLET | Freq: Two times a day (BID) | ORAL | 0 refills | Status: DC | PRN

## 2017-11-02 MED ORDER — KETOROLAC TROMETHAMINE 60 MG/2ML IM SOLN
30.0000 mg | Freq: Once | INTRAMUSCULAR | Status: AC
  Administered 2017-11-02: 30 mg via INTRAMUSCULAR

## 2017-11-02 NOTE — Discharge Instructions (Signed)
Medication as prescribed.  If no improvement or worsening, go to the ER.  Take care  Dr. Lacinda Axon

## 2017-11-02 NOTE — ED Triage Notes (Signed)
Patient states she has a headache since yesterday.

## 2017-11-02 NOTE — ED Provider Notes (Signed)
[facility]    CSN: [id number] Arrival date & time: 11/02/17  1340  History   Chief Complaint Chief Complaint  Patient presents with  . Headache   HPI  77 year old female with extensive past medical history presents with headache.  Patient reports she has had a headache for the past week.  Located in the frontal region as well as in the posterior neck.  Constant.  Moderate in severity, 7/10 currently.  She is taken over-the-counter sinus medication as well as Excedrin without improvement.  She states that she has photophobia but this is chronic and she has had it for years.  No reports of nausea.  She states that she has a history of migraines which she refers to as cluster headaches.  No known inciting factor.  She states that she has had some postnasal drip.  No relieving factors.  No other associated symptoms.  No other complaints.  Past Medical History:  Diagnosis Date  . Adenomatous polyps   . Allergic state   . Anemia   . Anginal pain ([place])   . Anxiety   . Arthritis    osteoarthritis  . Broken rib    left  . Cancer ([place])    skin  . Chicken pox   . Coronary artery disease   . Depression   . Eczema   . Fibromyalgia   . GERD (gastroesophageal reflux disease)   . Headache    migraines  . Hemorrhoids   . History of hiatal hernia   . Hypercalcemia   . Hyperlipidemia   . Hypertension   . Hypothyroidism   . Lumbar stenosis   . Obesity   . Peptic ulcer disease   . Shortness of breath dyspnea   . Sleep apnea   . Vertigo   . Vitamin D deficiency     Patient Active Problem List   Diagnosis Date Noted  . Lumbar spondylosis 06/01/2017  . Lumbar degenerative disc disease 06/01/2017  . History of lumbar fusion 06/01/2017  . 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 Chronic pain syndrome 06/01/2017  . 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 ABDOMINAL HYSTERECTOMY    . BACK SURGERY  2007   Dr. Mauri Pole, [facility],  Spinal Fusion  . CARDIAC CATHETERIZATION N/A 04/03/2015   Procedure: Left Heart Cath and Coronary Angiography;  Surgeon: Teodoro Spray, MD;  Location: [place] [facility];  Service: Cardiovascular;  Laterality: N/A;  . CHOLECYSTECTOMY    . CORONARY ANGIOPLASTY  2015   Dr. Ubaldo Glassing, [facility] In[facility]  . ESOPHAGOGASTRODUODENOSCOPY (EGD) WITH PROPOFOL N/A 12/21/2016   Procedure: ESOPHAGOGASTRODUODENOSCOPY (EGD) WITH PROPOFOL;  Surgeon: Manya Silvas, MD;  Location: [facility] ENDOSCOPY;  Service: Endoscopy;  Laterality: N/A;  . FRACTURE SURGERY Left 1991   Fractures Femur, [place] GUIDED SINUS SURGERY Bilateral 09/28/2015   Procedure: IMAGE GUIDED SINUS SURGERY, SEPTOPLASTY, BILATERAL FRONTAL SINUSOTOMIES, BILATERAL MAXILLARY ANTROSTOMIES, BILATERAL TOTAL ETHMOIDECTOMY, BILATERAL SPHENOIDECTOMY, BILATERAL INFERIOR TURBINATE REDUCTION;  Surgeon: Margaretha Sheffield, MD;  Location: [facility] ORS;  Service: ENT;  Laterality: Bilateral;   OB History   None    Home Medications    Prior to Admission medications   Medication Sig Start Date End Date Taking? Authorizing Provider  acetaminophen (TYLENOL) 500 MG tablet Take 500 mg by mouth every 6 (six) hours as needed.   Yes [provider]  albuterol (PROVENTIL HFA;VENTOLIN HFA) 108 (90 BASE) MCG/ACT inhaler Inhale 2 puffs into the  lungs.   Yes [provider]  amLODipine (NORVASC) 5 MG tablet Take 5 mg by mouth 2 (two) times daily.   Yes [provider]  citalopram (CELEXA) 40 MG tablet Take 40 mg by mouth daily.   Yes [provider]  clonazePAM (KLONOPIN) 1 MG tablet Take 1 mg by mouth 2 (two) times daily. As needed for anxiety   Yes [provider]  enalapril (VASOTEC) 20 MG tablet Take 20 mg by mouth daily.   Yes [provider]  loratadine (CLARITIN) 10 MG tablet Take 1 tablet (10 mg total) by mouth daily. Take 1 tablet in the morning. As needed for itching. 02/14/17  Yes Frederich Cha, MD  meclizine  (ANTIVERT) 25 MG tablet Take 25 mg by mouth 3 (three) times daily as needed for dizziness.   Yes [provider]  metoprolol tartrate (LOPRESSOR) 25 MG tablet Take 12.5 mg by mouth 2 (two) times daily.    Yes [provider]  cetirizine (ZYRTEC) 10 MG tablet Take 1 tablet (10 mg total) by mouth daily. If needed at night for itching not relieved by Claritin in the morning. 02/14/17   Frederich Cha, MD  diclofenac sodium (VOLTAREN) 1 % GEL Apply 2 g topically 4 (four) times daily.    [provider]  ergocalciferol (VITAMIN D2) 50000 UNITS capsule Take 50,000 Units by mouth every 30 (thirty) days.    [provider]  fluticasone (VERAMYST) 27.5 MCG/SPRAY nasal spray Place 2 sprays into the nose daily.    [provider]  gabapentin (NEURONTIN) 300 MG capsule Take 600 mg by mouth 3 (three) times daily. Two tabs to total 600 mgm by mouth 3 times daily    [provider]  levothyroxine (SYNTHROID, LEVOTHROID) 112 MCG tablet Take 137 mcg by mouth daily before breakfast.     [provider]  naproxen (NAPROSYN) 500 MG tablet Take 1 tablet (500 mg total) by mouth 2 (two) times daily as needed. 11/02/17   Coral Spikes, DO  nitroGLYCERIN (NITROSTAT) 0.4 MG SL tablet Place 0.4 mg under the tongue every 5 (five) minutes as needed for chest pain.    [provider]  omeprazole (PRILOSEC) 20 MG capsule Take 40 mg by mouth 2 (two) times daily before a meal.     [provider]  ranitidine (ZANTAC) 150 MG capsule Take 1 capsule (150 mg total) by mouth 2 (two) times daily. 02/14/17   Frederich Cha, MD   Family History Family History  Problem Relation Age of Onset  . Hypertension Mother   . Cancer Mother   . Diabetes Father   . Hypertension Father   . Heart failure Father   . Breast cancer Neg Hx    Social History Social History   Tobacco Use  . Smoking status: Former Research scientist (life sciences)  . Smokeless tobacco: Never Used  Substance Use Topics    . Alcohol use: No  . Drug use: No   Allergies   Zithromax [azithromycin]; Adhesive [tape]; Codeine; Lactose intolerance (gi); Lodine [etodolac]; Morphine and related; Red dye; Shellfish-derived products; and Latex  Review of Systems Review of Systems  Constitutional: Positive for fatigue.  Neurological:       Headache.   Physical Exam Triage Vital Signs ED Triage Vitals  Enc Vitals Group     BP 11/02/17 1405 (!) 157/91     Pulse Rate 11/02/17 1405 64     Resp 11/02/17 1405 18     Temp 11/02/17 1405 97.8  F (36.6 C)     Temp Source 11/02/17 1405 Oral     SpO2 11/02/17 1405 100 %     Weight 11/02/17 1358 225 lb (102.1 kg)     Height 11/02/17 1358 5\' 5"  (1.651 m)     Head Circumference --      Peak Flow --      Pain Score 11/02/17 1358 7     Pain Loc --      Pain Edu? --      Excl. in [place]? --    Updated Vital Signs BP (!) 157/91 (BP Location: Left Arm)   Pulse 64   Temp 97.8 F (36.6 C) (Oral)   Resp 18   Ht 5\' 5"  (1.651 m)   Wt 225 lb (102.1 kg)   SpO2 100%   BMI 37.44 kg/m    Physical Exam  Constitutional: She is oriented to person, place, and time. She appears well-developed. No distress.  HENT:  Head: Normocephalic and atraumatic.  Mouth/Throat: Oropharynx is clear and moist.  Eyes: Pupils are equal, round, and reactive to light.  Cardiovascular: Normal rate and regular rhythm.  Pulmonary/Chest: Effort normal and breath sounds normal. She has no wheezes. She has no rales.  Neurological: She is alert and oriented to person, place, and time. No cranial nerve deficit. She exhibits normal muscle tone.  Normal muscle strength.  Psychiatric: She has a normal mood and affect. Her behavior is normal.  Nursing note and vitals reviewed.  UC Treatments / Results  Labs (all labs ordered are listed, but only abnormal results are displayed) Labs Reviewed - No data to display  EKG None  Radiology Dg C-arm 1-60 Min-no Report  Result Date: 11/01/2017 Fluoroscopy  was utilized by the requesting physician.  No radiographic interpretation.   Procedures Procedures (including critical care time)  Medications Ordered in UC Medications  ketorolac (TORADOL) injection 30 mg (30 mg Intramuscular Given 11/02/17 1429)   Initial Impression / Assessment and Plan / UC Course  I have reviewed the triage vital signs and the nursing notes.  Pertinent labs & imaging results that were available during my care of the patient were reviewed by me and considered in my medical decision making (see chart for details).    77 year old female presents with headache. Likely tension. Normal neurological exam.  Toradol given here (patient has had recent labs with normal renal function).  Brief supply of naproxen given to be used as needed.  If worsens, needs to go to the ER for imaging.  If persists and she is stable, she can follow-up with primary care physician.  Final Clinical Impressions(s) / UC Diagnoses   Final diagnoses:  Acute intractable tension-type headache     Discharge Instructions     Medication as prescribed.  If no improvement or worsening, go to the ER.  Take care  Dr. Lacinda Axon    ED Prescriptions    Medication Sig Dispense Auth. Provider   naproxen (NAPROSYN) 500 MG tablet Take 1 tablet (500 mg total) by mouth 2 (two) times daily as needed. 10 tablet Coral Spikes, DO     Controlled Substance Prescriptions [place] Controlled Substance Registry consulted? Not Applicable  Coral Spikes, DO 11/02/17 1519

## 2017-12-02 ENCOUNTER — Ambulatory Visit
Admission: EM | Admit: 2017-12-02 | Discharge: 2017-12-02 | Disposition: A | Discharge status: Home / Self Care | Payer: Medicare HMO | Attending: Family Medicine | Admitting: Family Medicine

## 2017-12-02 ENCOUNTER — Encounter: Payer: Self-pay | Admitting: Gynecology

## 2017-12-02 ENCOUNTER — Other Ambulatory Visit: Payer: Self-pay

## 2017-12-02 DIAGNOSIS — R399 Unspecified symptoms and signs involving the genitourinary system: Secondary | ICD-10-CM | POA: Diagnosis not present

## 2017-12-02 DIAGNOSIS — N390 Urinary tract infection, site not specified: Secondary | ICD-10-CM

## 2017-12-02 LAB — URINALYSIS, COMPLETE (UACMP) WITH MICROSCOPIC
BILIRUBIN URINE: NEGATIVE
Bacteria, UA: NONE SEEN
GLUCOSE, UA: NEGATIVE mg/dL
Ketones, ur: NEGATIVE mg/dL
NITRITE: POSITIVE — AB
PH: 6.5 (ref 5.0–8.0)
Protein, ur: NEGATIVE mg/dL
RBC / HPF: NONE SEEN RBC/hpf (ref 0–5)
SPECIFIC GRAVITY, URINE: 1.01 (ref 1.005–1.030)

## 2017-12-02 NOTE — ED Triage Notes (Signed)
Patient c/o recurrent UTI's. Per patient dizziness at times.

## 2017-12-02 NOTE — Discharge Instructions (Signed)
I do not feel that you have a UTI.  We will call with the culture results.  Please follow up with your primary care doctor. There are lots of causes for your symptoms. I would recommend that you see a urologist as well.   Take care  Dr. Lacinda Axon

## 2017-12-02 NOTE — ED Provider Notes (Signed)
[facility]    CSN: [id number] Arrival date & time: 12/02/17  1230  History   Chief Complaint Chief Complaint  Patient presents with  . Urinary Tract Infection   HPI  77 year old female presents with concerns for UTI.  Additionally, patient has several other complaints at this time.  Patient reports that she has had urinary symptoms for 8 months.  She states that most recently her symptoms have been going on for 3 weeks.  She reports frequency, urgency, burning, urinary pressure.  No reports of fever.  No known exacerbating or relieving factors.  She reports some associated incontinence.  Additionally, patient reports that she has been experiencing ongoing dizziness.  It is very difficult for her to describe the dizziness.  No reports of vertiginous symptoms.  She does state that it seems to be worse when she gets up.  Of note, patient ambulated down the hallway without difficulty other than slowing down for what appeared to be shortness of breath.  Ongoing shortness of breath as well.  Patient also reports ongoing chronic pain.  Patient states that she is overwhelmed.  Past Medical History:  Diagnosis Date  . Adenomatous polyps   . Allergic state   . Anemia   . Anginal pain ([place])   . Anxiety   . Arthritis    osteoarthritis  . Broken rib    left  . Cancer ([place])    skin  . Chicken pox   . Coronary artery disease   . Depression   . Eczema   . Fibromyalgia   . GERD (gastroesophageal reflux disease)   . Headache    migraines  . Hemorrhoids   . History of hiatal hernia   . Hypercalcemia   . Hyperlipidemia   . Hypertension   . Hypothyroidism   . Lumbar stenosis   . Obesity   . Peptic ulcer disease   . Shortness of breath dyspnea   . Sleep apnea   . Vertigo   . Vitamin D deficiency     Patient Active Problem List   Diagnosis Date Noted  . Lumbar spondylosis 06/01/2017  . Lumbar degenerative disc disease 06/01/2017  . History of lumbar fusion 06/01/2017    . 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 Chronic pain syndrome 06/01/2017  . 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 ABDOMINAL HYSTERECTOMY    . BACK SURGERY  2007   Dr. Mauri Pole, [facility], Spinal Fusion  . CARDIAC CATHETERIZATION N/A 04/03/2015   Procedure: Left Heart Cath and Coronary Angiography;  Surgeon: Teodoro Spray, MD;  Location: [place] [facility];  Service: Cardiovascular;  Laterality: N/A;  . CHOLECYSTECTOMY    . CORONARY ANGIOPLASTY  2015   Dr. Ubaldo Glassing, [facility] Cath Lab  . ESOPHAGOGASTRODUODENOSCOPY (EGD) WITH PROPOFOL N/A 12/21/2016   Procedure: ESOPHAGOGASTRODUODENOSCOPY (EGD) WITH PROPOFOL;  Surgeon: Manya Silvas, MD;  Location: [facility] ENDOSCOPY;  Service: Endoscopy;  Laterality: N/A;  . FRACTURE SURGERY Left 1991   Fractures Femur, [place] GUIDED SINUS SURGERY Bilateral 09/28/2015   Procedure: IMAGE GUIDED SINUS SURGERY, SEPTOPLASTY, BILATERAL FRONTAL SINUSOTOMIES, BILATERAL MAXILLARY ANTROSTOMIES, BILATERAL TOTAL ETHMOIDECTOMY, BILATERAL SPHENOIDECTOMY, BILATERAL INFERIOR TURBINATE REDUCTION;  Surgeon: Margaretha Sheffield, MD;  Location: [facility] ORS;  Service: ENT;  Laterality: Bilateral;    OB History   None      Home Medications    Prior to Admission medications   Medication Sig Start Date End Date Taking? Authorizing Provider  acetaminophen (TYLENOL) 500 MG tablet Take 500 mg by mouth every 6 (six) hours as needed.   Yes [provider]  albuterol (PROVENTIL HFA;VENTOLIN HFA) 108 (90 BASE) MCG/ACT inhaler Inhale 2 puffs into the lungs.   Yes [provider]  amLODipine (NORVASC) 5 MG tablet Take 5 mg by mouth 2 (two) times daily.   Yes [provider]  cetirizine (ZYRTEC) 10 MG tablet Take 1 tablet (10 mg total) by mouth daily. If needed at night for itching not relieved by Claritin in the morning. 02/14/17  Yes Frederich Cha, MD  citalopram (CELEXA) 40 MG tablet Take 40 mg by mouth daily.    Yes [provider]  clonazePAM (KLONOPIN) 1 MG tablet Take 1 mg by mouth 2 (two) times daily. As needed for anxiety   Yes [provider]  diclofenac sodium (VOLTAREN) 1 % GEL Apply 2 g topically 4 (four) times daily.   Yes [provider]  enalapril (VASOTEC) 20 MG tablet Take 20 mg by mouth daily.   Yes [provider]  ergocalciferol (VITAMIN D2) 50000 UNITS capsule Take 50,000 Units by mouth every 30 (thirty) days.   Yes [provider]  fluticasone (VERAMYST) 27.5 MCG/SPRAY nasal spray Place 2 sprays into the nose daily.   Yes [provider]  gabapentin (NEURONTIN) 300 MG capsule Take 600 mg by mouth 3 (three) times daily. Two tabs to total 600 mgm by mouth 3 times daily   Yes [provider]  levothyroxine (SYNTHROID, LEVOTHROID) 112 MCG tablet Take 137 mcg by mouth daily before breakfast.    Yes [provider]  loratadine (CLARITIN) 10 MG tablet Take 1 tablet (10 mg total) by mouth daily. Take 1 tablet in the morning. As needed for itching. 02/14/17  Yes Frederich Cha, MD  meclizine (ANTIVERT) 25 MG tablet Take 25 mg by mouth 3 (three) times daily as needed for dizziness.   Yes [provider]  metoprolol tartrate (LOPRESSOR) 25 MG tablet Take 12.5 mg by mouth 2 (two) times daily.    Yes [provider]  nitroGLYCERIN (NITROSTAT) 0.4 MG SL tablet Place 0.4 mg under the tongue every 5 (five) minutes as needed for chest pain.   Yes [provider]  omeprazole (PRILOSEC) 20 MG capsule Take 40 mg by mouth 2 (two) times daily before a meal.    Yes [provider]  ranitidine (ZANTAC) 150 MG capsule Take 1 capsule (150 mg total) by mouth 2 (two) times daily. 02/14/17  Yes Frederich Cha, MD    Family History Family History  Problem Relation Age of Onset  . Hypertension Mother   . Cancer Mother   . Diabetes Father   . Hypertension Father   . Heart failure Father   . Breast cancer Neg Hx      Social History Social History   Tobacco Use  . Smoking status: Former Research scientist (life sciences)  . Smokeless tobacco: Never Used  Substance Use Topics  . Alcohol use: No  . Drug use: No     Allergies   Zithromax [azithromycin]; Adhesive [tape]; Codeine; Lactose intolerance (gi); Lodine [etodolac]; Morphine and related; Red dye; Shellfish-derived products; and Latex   Review of Systems Review of Systems  Constitutional: Negative for fever.  Respiratory: Positive for shortness of breath.   Genitourinary: Positive for dysuria, frequency and urgency.  Neurological: Positive for dizziness.   Physical Exam Triage Vital Signs ED Triage Vitals  Enc Vitals Group     BP 12/02/17 1243 [name])  143/76     Pulse Rate 12/02/17 1243 66     Resp 12/02/17 1243 16     Temp 12/02/17 1243 97.6 F (36.4 C)     Temp Source 12/02/17 1243 Oral     SpO2 12/02/17 1243 98 %     Weight 12/02/17 1246 232 lb (105.2 kg)     Height --      Head Circumference --      Peak Flow --      Pain Score 12/02/17 1246 6     Pain Loc --      Pain Edu? --      Excl. in [place]? --    Updated Vital Signs BP (!) 143/76 (BP Location: Left Arm)   Pulse 66   Temp 97.6 F (36.4 C) (Oral)   Resp 16   Wt 232 lb (105.2 kg)   SpO2 98%   BMI 38.61 kg/m   Physical Exam  Constitutional: She is oriented to person, place, and time. She appears well-developed. No distress.  HENT:  Head: Normocephalic and atraumatic.  Nose: Nose normal.  Eyes: Conjunctivae are normal. No scleral icterus.  Cardiovascular: Normal rate and regular rhythm.  Pulmonary/Chest: Effort normal and breath sounds normal. She has no wheezes. She has no rales.  Abdominal: Soft. She exhibits no distension. There is no tenderness.  Neurological: She is alert and oriented to person, place, and time.  Psychiatric: Her behavior is normal.  Flat affect.  Nursing note and vitals reviewed.  UC Treatments / Results  Labs (all labs ordered are listed, but only abnormal  results are displayed) Labs Reviewed  URINALYSIS, COMPLETE (UACMP) WITH MICROSCOPIC - Abnormal; Notable for the following components:      Result Value   APPearance HAZY (*)    Hgb urine dipstick TRACE (*)    Nitrite POSITIVE (*)    Leukocytes, UA SMALL (*)    All other components within normal limits  URINE CULTURE   EKG None  Radiology No results found.  Procedures Procedures (including critical care time)  Medications Ordered in UC Medications - No data to display  Initial Impression / Assessment and Plan / UC Course  I have reviewed the triage vital signs and the nursing notes.  Pertinent labs & imaging results that were available during my care of the patient were reviewed by me and considered in my medical decision making (see chart for details).    77 year old female presents with primary complaint of urinary tract symptoms.  She also has multiple other complaints.  Her urinalysis revealed small leukocytes, trace hemoglobin, positive nitrite.  Her microscopy revealed no bacteria, no red blood cells, 11-20 white blood cells but also a contaminated sample with 11-20 squamous epithelials.  We will send culture.  I am not treating her empirically for UTI given her clinical picture and results above.  I advised her to follow-up with her primary care doctor.  She seemed displeased regarding her ongoing care.  She states that she will be going to [facility] soon.  Final Clinical Impressions(s) / UC Diagnoses   Final diagnoses:  Lower urinary tract symptoms     Discharge Instructions     I do not feel that you have a UTI.  We will call with the culture results.  Please follow up with your primary care doctor. There are lots of causes for your symptoms. I would recommend that you see a urologist as well.   Take care  Dr. Lacinda Axon  ED Prescriptions    None     Controlled Substance Prescriptions [place] Controlled Substance Registry consulted? Not Applicable   Coral Spikes,  DO 12/02/17 1329

## 2017-12-04 LAB — URINE CULTURE

## 2018-03-08 ENCOUNTER — Ambulatory Visit
Admission: EM | Admit: 2018-03-08 | Discharge: 2018-03-08 | Disposition: A | Discharge status: Home / Self Care | Payer: Medicare HMO | Attending: Family Medicine | Admitting: Family Medicine

## 2018-03-08 ENCOUNTER — Other Ambulatory Visit: Payer: Self-pay

## 2018-03-08 ENCOUNTER — Encounter: Payer: Self-pay | Admitting: Emergency Medicine

## 2018-03-08 DIAGNOSIS — B9789 Other viral agents as the cause of diseases classified elsewhere: Secondary | ICD-10-CM | POA: Diagnosis not present

## 2018-03-08 DIAGNOSIS — J069 Acute upper respiratory infection, unspecified: Secondary | ICD-10-CM

## 2018-03-08 DIAGNOSIS — R05 Cough: Secondary | ICD-10-CM | POA: Diagnosis not present

## 2018-03-08 MED ORDER — BENZONATATE 100 MG PO CAPS: 100.0000 mg | ORAL_CAPSULE | Freq: Three times a day (TID) | ORAL | 0 refills | Status: DC | PRN

## 2018-03-08 NOTE — Discharge Instructions (Addendum)
Take medication as prescribed. Over the counter mucinex as needed. Rest. Drink plenty of fluids.   Follow up with your primary care physician this week as needed. Return to Urgent care for new or worsening concerns.

## 2018-03-08 NOTE — ED Provider Notes (Signed)
[facility] ____________________________________________  Time seen: Approximately 10:00 AM  I have reviewed the triage vital signs and the nursing notes.   HISTORY  Chief Complaint Sinus Problem and Cough   HPI Stacy Allen is a 77 y.o. female presenting for evaluation of 4 days of runny nose, and cough symptoms.  Patient reports Sunday symptoms started with some sneezing.  Reports Monday after being outside she had continued sneezing with increased nasal congestion and some coughing.  States initially she thought it was just "hayfever".  Patient reports congestion has continued for the last 2 days.  States currently feels better than she did last night.  States her cough is a tickly hacking cough.  Denies any known fevers.  States occasional sore throat only with coughing.  No sore throat or pain with swallowing.  Continues to eat and drink well.  States did take some over-the-counter Mucinex and some over-the-counter cough syrup she had at home which did help some.  Also using cough drops.  Denies other aggravating alleviating factors.  Reports otherwise feels well.  Patient reports that she has chronic lasting shortness of breath, denies any acute on chronic shortness of breath, and no changes of her baseline.  Denies chest pain, abdominal pain, extremity pain, extremity swelling.   Past Medical History:  Diagnosis Date  . Adenomatous polyps   . Allergic state   . Anemia   . Anginal pain ([place])   . Anxiety   . Arthritis    osteoarthritis  . Broken rib    left  . Cancer ([place])    skin  . Chicken pox   . Coronary artery disease   . Depression   . Eczema   . Fibromyalgia   . GERD (gastroesophageal reflux disease)   . Headache    migraines  . Hemorrhoids   . History of hiatal hernia   . Hypercalcemia   . Hyperlipidemia   . Hypertension   . Hypothyroidism   . Lumbar stenosis   . Obesity   . Peptic ulcer disease   . Shortness of breath dyspnea   . Sleep  apnea   . Vertigo   . Vitamin D deficiency     Patient Active Problem List   Diagnosis Date Noted  . Lumbar spondylosis 06/01/2017  . Lumbar degenerative disc disease 06/01/2017  . History of lumbar fusion 06/01/2017  . 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 Chronic pain syndrome 06/01/2017  . 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 ABDOMINAL HYSTERECTOMY    . BACK SURGERY  2007   Dr. Mauri Pole, [facility], Spinal Fusion  . CARDIAC CATHETERIZATION N/A 04/03/2015   Procedure: Left Heart Cath and Coronary Angiography;  Surgeon: Teodoro Spray, MD;  Location: [place] [facility];  Service: Cardiovascular;  Laterality: N/A;  . CHOLECYSTECTOMY    . CORONARY ANGIOPLASTY  2015   Dr. Ubaldo Glassing, [facility] Cath Lab  . ESOPHAGOGASTRODUODENOSCOPY (EGD) WITH PROPOFOL N/A 12/21/2016   Procedure: ESOPHAGOGASTRODUODENOSCOPY (EGD) WITH PROPOFOL;  Surgeon: Manya Silvas, MD;  Location: [facility] ENDOSCOPY;  Service: Endoscopy;  Laterality: N/A;  . FRACTURE SURGERY Left 1991   Fractures Femur, [place] GUIDED SINUS SURGERY Bilateral 09/28/2015   Procedure: IMAGE GUIDED SINUS SURGERY, SEPTOPLASTY, BILATERAL FRONTAL SINUSOTOMIES, BILATERAL MAXILLARY ANTROSTOMIES, BILATERAL TOTAL ETHMOIDECTOMY, BILATERAL SPHENOIDECTOMY, BILATERAL INFERIOR TURBINATE REDUCTION;  Surgeon: Margaretha Sheffield, MD;  Location: [facility] ORS;  Service: ENT;  Laterality: Bilateral;     No current  facility-administered medications for this encounter.   Current Outpatient Medications:  .  albuterol (PROVENTIL HFA;VENTOLIN HFA) 108 (90 BASE) MCG/ACT inhaler, Inhale 2 puffs into the lungs., Disp: , Rfl:  .  amLODipine (NORVASC) 5 MG tablet, Take 5 mg by mouth 2 (two) times daily., Disp: , Rfl:  .  cetirizine (ZYRTEC) 10 MG tablet, Take 1 tablet (10 mg total) by mouth daily. If needed at night for itching not relieved by Claritin in the morning., Disp: 30 tablet, Rfl: 0 .  citalopram (CELEXA) 40 MG  tablet, Take 40 mg by mouth daily., Disp: , Rfl:  .  clonazePAM (KLONOPIN) 1 MG tablet, Take 1 mg by mouth 2 (two) times daily. As needed for anxiety, Disp: , Rfl:  .  enalapril (VASOTEC) 20 MG tablet, Take 20 mg by mouth daily., Disp: , Rfl:  .  ergocalciferol (VITAMIN D2) 50000 UNITS capsule, Take 50,000 Units by mouth every 30 (thirty) days., Disp: , Rfl:  .  fluticasone (VERAMYST) 27.5 MCG/SPRAY nasal spray, Place 2 sprays into the nose daily., Disp: , Rfl:  .  gabapentin (NEURONTIN) 300 MG capsule, Take 600 mg by mouth 3 (three) times daily. Two tabs to total 600 mgm by mouth 3 times daily, Disp: , Rfl:  .  levothyroxine (SYNTHROID, LEVOTHROID) 112 MCG tablet, Take 137 mcg by mouth daily before breakfast. , Disp: , Rfl:  .  omeprazole (PRILOSEC) 20 MG capsule, Take 40 mg by mouth 2 (two) times daily before a meal. , Disp: , Rfl:  .  acetaminophen (TYLENOL) 500 MG tablet, Take 500 mg by mouth every 6 (six) hours as needed., Disp: , Rfl:  .  benzonatate (TESSALON PERLES) 100 MG capsule, Take 1 capsule (100 mg total) by mouth 3 (three) times daily as needed for cough., Disp: 20 capsule, Rfl: 0 .  diclofenac sodium (VOLTAREN) 1 % GEL, Apply 2 g topically 4 (four) times daily., Disp: , Rfl:  .  loratadine (CLARITIN) 10 MG tablet, Take 1 tablet (10 mg total) by mouth daily. Take 1 tablet in the morning. As needed for itching., Disp: 30 tablet, Rfl: 0 .  meclizine (ANTIVERT) 25 MG tablet, Take 25 mg by mouth 3 (three) times daily as needed for dizziness., Disp: , Rfl:  .  metoprolol tartrate (LOPRESSOR) 25 MG tablet, Take 12.5 mg by mouth 2 (two) times daily. , Disp: , Rfl:  .  nitroGLYCERIN (NITROSTAT) 0.4 MG SL tablet, Place 0.4 mg under the tongue every 5 (five) minutes as needed for chest pain., Disp: , Rfl:  .  ranitidine (ZANTAC) 150 MG capsule, Take 1 capsule (150 mg total) by mouth 2 (two) times daily., Disp: 30 capsule, Rfl: 0  Allergies Zithromax [azithromycin]; Adhesive [tape]; Codeine;  Lactose intolerance (gi); Lodine [etodolac]; Morphine and related; Red dye; Shellfish-derived products; and Latex  Family History  Problem Relation Age of Onset  . Hypertension Mother   . Cancer Mother   . Diabetes Father   . Hypertension Father   . Heart failure Father   . Breast cancer Neg Hx     Social History Social History   Tobacco Use  . Smoking status: Former Research scientist (life sciences)  . Smokeless tobacco: Never Used  Substance Use Topics  . Alcohol use: No  . Drug use: No    Review of Systems Constitutional: No fever/chills Eyes: No visual changes. ENT: As above Cardiovascular: Denies chest pain. Respiratory: As above.  Gastrointestinal: No abdominal pain.   Musculoskeletal: Negative for back pain. Skin: Negative for rash.  ____________________________________________   PHYSICAL EXAM:  VITAL SIGNS: ED Triage Vitals  Enc Vitals Group     BP 03/08/18 0921 (!) 154/91     Pulse Rate 03/08/18 0921 63     Resp 03/08/18 0921 16     Temp 03/08/18 0921 98.5 F (36.9 C)     Temp Source 03/08/18 0921 Oral     SpO2 03/08/18 0921 99 %     Weight 03/08/18 0917 232 lb (105.2 kg)     Height 03/08/18 0917 5\' 4"  (1.626 m)     Head Circumference --      Peak Flow --      Pain Score 03/08/18 0917 0     Pain Loc --      Pain Edu? --      Excl. in [place]? --     Constitutional: Alert and oriented. Well appearing and in no acute distress. Eyes: Conjunctivae are normal.  Head: Atraumatic. No sinus tenderness to palpation. No swelling. No erythema.  Ears: no erythema, normal TMs bilaterally.   Nose:Nasal congestion with clear rhinorrhea  Mouth/Throat: Mucous membranes are moist. No pharyngeal erythema. No tonsillar swelling or exudate.  Neck: No stridor.  No cervical spine tenderness to palpation. Hematological/Lymphatic/Immunilogical: No cervical lymphadenopathy. Cardiovascular: Normal rate, regular rhythm. Grossly normal heart sounds.  Good peripheral circulation. Respiratory: Normal  respiratory effort.  No retractions. No wheezes, rales or rhonchi. Good air movement.  Musculoskeletal: Ambulatory with steady gait. No cervical, thoracic or lumbar tenderness to palpation.  No lower extremity edema noted. Neurologic:  Normal speech and language. No gait instability. Skin:  Skin appears warm, dry and intact. No rash noted. Psychiatric: Mood and affect are normal. Speech and behavior are normal.  ___________________________________________   LABS (all labs ordered are listed, but only abnormal results are displayed)  Labs Reviewed - No data to display ____________________________________________   PROCEDURES Procedures    INITIAL IMPRESSION / ASSESSMENT AND PLAN / ED COURSE  Pertinent labs & imaging results that were available during my care of the patient were reviewed by me and considered in my medical decision making (see chart for details).  Very well-appearing patient.  No acute distress.  Suspect viral upper respiratory infection.  Encourage patient to continue Mucinex, Rx [employer]  Encourage supportive care.  Discussed strict follow-up and return parameters.Discussed indication, risks and benefits of medications with patient.  Discussed follow up with Primary care physician this week. Discussed follow up and return parameters including no resolution or any worsening concerns. Patient verbalized understanding and agreed to plan.   ____________________________________________   FINAL CLINICAL IMPRESSION(S) / ED DIAGNOSES  Final diagnoses:  Viral URI with cough     ED Discharge Orders         Ordered    benzonatate (TESSALON PERLES) 100 MG capsule  3 times daily PRN     03/08/18 0951           Note: This dictation was prepared with Dragon dictation along with smaller phrase technology. Any transcriptional errors that result from this process are unintentional.         Marylene Land, NP 03/08/18 1032

## 2018-03-08 NOTE — ED Triage Notes (Signed)
Patient c/o sinus congestion, runny nose, and cough since Sunday.  Patient has not checked her temperature.

## 2018-04-03 ENCOUNTER — Other Ambulatory Visit: Payer: Self-pay | Admitting: Obstetrics and Gynecology

## 2018-04-03 DIAGNOSIS — Z1231 Encounter for screening mammogram for malignant neoplasm of breast: Secondary | ICD-10-CM

## 2018-06-04 ENCOUNTER — Other Ambulatory Visit: Payer: Self-pay | Admitting: Obstetrics and Gynecology

## 2018-06-04 DIAGNOSIS — Z1231 Encounter for screening mammogram for malignant neoplasm of breast: Secondary | ICD-10-CM

## 2018-06-29 ENCOUNTER — Other Ambulatory Visit: Payer: Self-pay

## 2018-06-29 ENCOUNTER — Ambulatory Visit
Admission: EM | Admit: 2018-06-29 | Discharge: 2018-06-29 | Disposition: A | Discharge status: Other Acute Inpatient Hospital | Payer: Medicare HMO

## 2018-06-29 DIAGNOSIS — I160 Hypertensive urgency: Secondary | ICD-10-CM

## 2018-06-29 DIAGNOSIS — R42 Dizziness and giddiness: Secondary | ICD-10-CM | POA: Diagnosis not present

## 2018-06-29 MED ORDER — CLONIDINE HCL 0.1 MG PO TABS
0.1000 mg | ORAL_TABLET | Freq: Once | ORAL | Status: AC
  Administered 2018-06-29: 0.1 mg via ORAL

## 2018-06-29 NOTE — ED Provider Notes (Addendum)
[facility]    CSN: [id number] Arrival date & time: 06/29/18  0954     History   Chief Complaint Chief Complaint  Patient presents with  . Dizziness  . Headache    HPI Stacy Allen is a 78 y.o. female.   Patient is a 78 year old female who presents with complaint of high blood pressure, dizziness, and sinus congestion.  Patient states her dizziness is been ongoing for about 1 week and states the headache started on Monday (today is Friday).  Patient also reports feeling fatigued as well.  Patient denies any chest pain but does report some intermittent shortness of breath with exertion short walks.  Patient does have a history of heart disease with stent placement in the past.  Patient denies any chest pain at this time.  She does report a little tingling middle of her face on both sides.  Patient states she took her home amlodipine, enalapril, and metoprolol about 945 this morning, about 1 hour prior to interview time.     Past Medical History:  Diagnosis Date  . Adenomatous polyps   . Allergic state   . Anemia   . Anginal pain ([place])   . Anxiety   . Arthritis    osteoarthritis  . Broken rib    left  . Cancer ([place])    skin  . Chicken pox   . Coronary artery disease   . Depression   . Eczema   . Fibromyalgia   . GERD (gastroesophageal reflux disease)   . Headache    migraines  . Hemorrhoids   . History of hiatal hernia   . Hypercalcemia   . Hyperlipidemia   . Hypertension   . Hypothyroidism   . Lumbar stenosis   . Obesity   . Peptic ulcer disease   . Shortness of breath dyspnea   . Sleep apnea   . Vertigo   . Vitamin D deficiency     Patient Active Problem List   Diagnosis Date Noted  . Lumbar spondylosis 06/01/2017  . Lumbar degenerative disc disease 06/01/2017  . History of lumbar fusion 06/01/2017  . 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 Chronic pain syndrome 06/01/2017  . SI joint arthritis 06/01/2017    Past Surgical History:    Procedure Laterality Date  . ABDOMINAL HYSTERECTOMY    . BACK SURGERY  2007   Dr. Mauri Pole, [facility], Spinal Fusion  . CARDIAC CATHETERIZATION N/A 04/03/2015   Procedure: Left Heart Cath and Coronary Angiography;  Surgeon: Teodoro Spray, MD;  Location: [place] CV [facility];  Service: Cardiovascular;  Laterality: N/A;  . CHOLECYSTECTOMY    . CORONARY ANGIOPLASTY  2015   Dr. Ubaldo Glassing, [facility] Cath Lab  . ESOPHAGOGASTRODUODENOSCOPY (EGD) WITH PROPOFOL N/A 12/21/2016   Procedure: ESOPHAGOGASTRODUODENOSCOPY (EGD) WITH PROPOFOL;  Surgeon: Manya Silvas, MD;  Location: [facility] ENDOSCOPY;  Service: Endoscopy;  Laterality: N/A;  . FRACTURE SURGERY Left 1991   Fractures Femur, [place] GUIDED SINUS SURGERY Bilateral 09/28/2015   Procedure: IMAGE GUIDED SINUS SURGERY, SEPTOPLASTY, BILATERAL FRONTAL SINUSOTOMIES, BILATERAL MAXILLARY ANTROSTOMIES, BILATERAL TOTAL ETHMOIDECTOMY, BILATERAL SPHENOIDECTOMY, BILATERAL INFERIOR TURBINATE REDUCTION;  Surgeon: Margaretha Sheffield, MD;  Location: [facility] ORS;  Service: ENT;  Laterality: Bilateral;    OB History   No obstetric history on file.      Home Medications    Prior to Admission medications   Medication Sig Start Date End Date Taking? Authorizing Provider  acetaminophen (TYLENOL) 500 MG tablet Take 500 mg by  mouth every 6 (six) hours as needed.   Yes [provider]  albuterol (PROVENTIL HFA;VENTOLIN HFA) 108 (90 BASE) MCG/ACT inhaler Inhale 2 puffs into the lungs.   Yes [provider]  chlorthalidone (HYGROTON) 25 MG tablet Take 25 mg by mouth daily.   Yes [provider]  citalopram (CELEXA) 40 MG tablet Take 40 mg by mouth daily.   Yes [provider]  clonazePAM (KLONOPIN) 1 MG tablet Take 1 mg by mouth 2 (two) times daily. As needed for anxiety   Yes [provider]  enalapril (VASOTEC) 20 MG tablet Take 20 mg by mouth daily.   Yes [provider]  ergocalciferol (VITAMIN D2) 50000 UNITS capsule  Take 50,000 Units by mouth every 30 (thirty) days.   Yes [provider]  fluticasone (VERAMYST) 27.5 MCG/SPRAY nasal spray Place 2 sprays into the nose daily.   Yes [provider]  gabapentin (NEURONTIN) 300 MG capsule Take 600 mg by mouth 3 (three) times daily. Two tabs to total 600 mgm by mouth 3 times daily   Yes [provider]  guaiFENesin (MUCINEX) 600 MG 12 hr tablet Take by mouth 2 (two) times daily.   Yes [provider]  levothyroxine (SYNTHROID, LEVOTHROID) 112 MCG tablet Take 137 mcg by mouth daily before breakfast.    Yes [provider]  loratadine (CLARITIN) 10 MG tablet Take 1 tablet (10 mg total) by mouth daily. Take 1 tablet in the morning. As needed for itching. 02/14/17  Yes Frederich Cha, MD  meclizine (ANTIVERT) 25 MG tablet Take 25 mg by mouth 3 (three) times daily as needed for dizziness.   Yes [provider]  metoprolol tartrate (LOPRESSOR) 25 MG tablet Take 12.5 mg by mouth 2 (two) times daily.    Yes [provider]  naproxen sodium (ALEVE) 220 MG tablet Take 220 mg by mouth.   Yes [provider]  omeprazole (PRILOSEC) 20 MG capsule Take 40 mg by mouth 2 (two) times daily before a meal.    Yes [provider]  amLODipine (NORVASC) 5 MG tablet Take 5 mg by mouth 2 (two) times daily.    [provider]  benzonatate (TESSALON PERLES) 100 MG capsule Take 1 capsule (100 mg total) by mouth 3 (three) times daily as needed for cough. 03/08/18   Marylene Land, NP  cetirizine (ZYRTEC) 10 MG tablet Take 1 tablet (10 mg total) by mouth daily. If needed at night for itching not relieved by Claritin in the morning. 02/14/17   Frederich Cha, MD  diclofenac sodium (VOLTAREN) 1 % GEL Apply 2 g topically 4 (four) times daily.    [provider]  nitroGLYCERIN (NITROSTAT) 0.4 MG SL tablet Place 0.4 mg under the tongue every 5 (five) minutes as needed for chest pain.    [provider]    ranitidine (ZANTAC) 150 MG capsule Take 1 capsule (150 mg total) by mouth 2 (two) times daily. 02/14/17   Frederich Cha, MD    Family History Family History  Problem Relation Age of Onset  . Hypertension Mother   . Cancer Mother   . Diabetes Father   . Hypertension Father   . Heart failure Father   . Breast cancer Neg Hx     Social History Social History   Tobacco Use  . Smoking status: Former Research scientist (life sciences)  . Smokeless tobacco: Never Used  Substance Use Topics  . Alcohol use: No  . Drug use: No  Allergies   Zithromax [azithromycin]; Adhesive [tape]; Codeine; Lactose intolerance (gi); Lodine [etodolac]; Morphine and related; Red dye; Shellfish-derived products; and Latex   Review of Systems Review of Systems as noted above in HPI.  Other systems reviewed and found to be negative.   Physical Exam Triage Vital Signs ED Triage Vitals  Enc Vitals Group     BP 06/29/18 1017 (!) 212/106     Pulse Rate 06/29/18 1017 64     Resp --      Temp 06/29/18 1017 97.8 F (36.6 C)     Temp Source 06/29/18 1017 Temporal     SpO2 06/29/18 1017 99 %     Weight 06/29/18 1019 230 lb (104.3 kg)     Height 06/29/18 1019 5\' 3"  (1.6 m)     Head Circumference --      Peak Flow --      Pain Score 06/29/18 1018 9     Pain Loc --      Pain Edu? --      Excl. in [place]? --    No data found.  Updated Vital Signs BP (!) 189/105   Pulse 64   Temp 97.8 F (36.6 C) (Temporal)   Ht 5\' 3"  (1.6 m)   Wt 230 lb (104.3 kg)   SpO2 99%   BMI 40.74 kg/m    Physical Exam Vitals signs and nursing note reviewed.  Constitutional:      Appearance: She is well-developed. She is not ill-appearing.  HENT:     Head: Normocephalic and atraumatic.  Eyes:     Extraocular Movements: Extraocular movements intact.     Pupils: Pupils are equal, round, and reactive to light.  Cardiovascular:     Rate and Rhythm: Normal rate and regular rhythm.  Pulmonary:     Effort: Pulmonary effort is normal.     Breath  sounds: No wheezing, rhonchi or rales.  Abdominal:     General: Bowel sounds are normal.  Musculoskeletal: Normal range of motion.  Skin:    General: Skin is warm and dry.  Neurological:     General: No focal deficit present.     Mental Status: She is alert and oriented to person, place, and time.     GCS: GCS eye subscore is 4. GCS verbal subscore is 5. GCS motor subscore is 6.     Cranial Nerves: Cranial nerves are intact. No cranial nerve deficit, dysarthria or facial asymmetry.     Sensory: Sensation is intact.     Motor: Motor function is intact.     Comments: Patient walks with walker.  Tongue protrusion midline.  Sensation intact and equal.  Equal grip strength.  Psychiatric:        Mood and Affect: Mood normal.        Behavior: Behavior normal.     UC Treatments / Results  Labs (all labs ordered are listed, but only abnormal results are displayed) Labs Reviewed - No data to display  EKG EKG with ventricular rate of 59, QTc 429.  Automatic read out showing sinus bradycardia otherwise normal EKG.  Radiology No results found.  Procedures Procedures (including critical care time)  Medications Ordered in UC Medications  cloNIDine (CATAPRES) tablet 0.1 mg (0.1 mg Oral Given 06/29/18 1033)    Initial Impression / Assessment and Plan / UC Course  I have reviewed the triage vital signs and the nursing notes.  Pertinent labs & imaging results that were available during my care of the  patient were reviewed by me and considered in my medical decision making (see chart for details).     Patient presents with blood pressure 212/106.  Patient reports she had taken her 3 blood pressure medications about an hour prior to arrival.  Patient reports dizziness ongoing for about a week but does report some tingling sensation to the mid face.  Her neuro exam was benign.  However given her concern for hypertensive urgency and dizziness, 911 was contacted for transport of the patient to  the ER.  Patient was given clonidine x1.  She did not have improvement upon her blood pressure the time EMS arrival.  Her blood pressure was still with systolics greater than 354 upon their arrival.  Final Clinical Impressions(s) / UC Diagnoses   Final diagnoses:  Hypertensive urgency  Dizziness     Discharge Instructions     -Patient to be transferred to the ER for further evaluation and management.    ED Prescriptions    None     Controlled Substance Prescriptions [place] Controlled Substance Registry consulted? Not Applicable   Critical care time outside of procedures of 30 minutes in setting of patient with hypertension, dizziness, and facial tingling.  Luvenia Redden, PA-C 06/29/18 1558    Luvenia Redden, PA-C 06/29/18 1601

## 2018-06-29 NOTE — Discharge Instructions (Addendum)
-  Patient to be transferred to the ER for further evaluation and management.

## 2018-06-29 NOTE — ED Triage Notes (Signed)
Pt states that this started Tuesday. C/o sinus pressure which is causing headache and dizziness

## 2018-07-09 ENCOUNTER — Other Ambulatory Visit: Payer: Self-pay | Admitting: Otolaryngology

## 2018-07-09 DIAGNOSIS — J0191 Acute recurrent sinusitis, unspecified: Secondary | ICD-10-CM

## 2018-07-16 ENCOUNTER — Ambulatory Visit: Payer: Medicare HMO

## 2018-07-23 ENCOUNTER — Ambulatory Visit
Admission: RE | Admit: 2018-07-23 | Discharge: 2018-07-23 | Disposition: A | Discharge status: Home / Self Care | Payer: Medicare HMO | Source: Ambulatory Visit | Attending: Otolaryngology | Admitting: Otolaryngology

## 2018-07-23 DIAGNOSIS — J0191 Acute recurrent sinusitis, unspecified: Secondary | ICD-10-CM | POA: Insufficient documentation

## 2019-02-17 ENCOUNTER — Encounter: Payer: Self-pay | Admitting: Emergency Medicine

## 2019-02-17 ENCOUNTER — Ambulatory Visit
Admission: EM | Admit: 2019-02-17 | Discharge: 2019-02-17 | Disposition: A | Discharge status: Home / Self Care | Payer: Medicare HMO | Attending: Emergency Medicine | Admitting: Emergency Medicine

## 2019-02-17 ENCOUNTER — Ambulatory Visit (INDEPENDENT_AMBULATORY_CARE_PROVIDER_SITE_OTHER): Payer: Medicare HMO

## 2019-02-17 ENCOUNTER — Other Ambulatory Visit: Payer: Self-pay

## 2019-02-17 DIAGNOSIS — R0789 Other chest pain: Secondary | ICD-10-CM

## 2019-02-17 DIAGNOSIS — W01198A Fall on same level from slipping, tripping and stumbling with subsequent striking against other object, initial encounter: Secondary | ICD-10-CM | POA: Diagnosis not present

## 2019-02-17 DIAGNOSIS — J069 Acute upper respiratory infection, unspecified: Secondary | ICD-10-CM

## 2019-02-17 DIAGNOSIS — S20212A Contusion of left front wall of thorax, initial encounter: Secondary | ICD-10-CM

## 2019-02-17 DIAGNOSIS — J029 Acute pharyngitis, unspecified: Secondary | ICD-10-CM | POA: Diagnosis not present

## 2019-02-17 LAB — RAPID STREP SCREEN (MED CTR MEBANE ONLY): Streptococcus, Group A Screen (Direct): NEGATIVE

## 2019-02-17 NOTE — ED Provider Notes (Signed)
[facility]    CSN: [id number] Arrival date & time: 02/17/19  [id number]      History   Chief Complaint Chief Complaint  Patient presents with  . Rib Pain  . Cough  . Sore Throat    HPI Stacy Allen is a 78 y.o. female.   HPI  78 year old female with multiple comorbidities particularly osteoarthritis presents with a 3-day history of scratchy throat chills body aches and nonproductive cough that she has had since Friday.  She feels as if her glands are swollen in her neck.  She denies any fever.  He is afebrile today.  She has had no known exposure to COVID.  He has no loss of smell or taste.  She denies any diarrhea or nausea or vomiting.  States she lives by herself but does go outside to the drive-through's and grocery stores on occasion.  That she is not always "as good as I should be".  She also states that 3 days ago she fell and hit the left side of her rib cage on the door knob and has been since having left-sided rib pain over the area of contact.  Is just inferior to the axilla in the mid axillary line.  There is a contusion in the area.  There is no crepitus.  She denies any shortness of breath.           Past Medical History:  Diagnosis Date  . Adenomatous polyps   . Allergic state   . Anemia   . Anginal pain ([place])   . Anxiety   . Arthritis    osteoarthritis  . Broken rib    left  . Cancer ([place])    skin  . Chicken pox   . Coronary artery disease   . Depression   . Eczema   . Fibromyalgia   . GERD (gastroesophageal reflux disease)   . Headache    migraines  . Hemorrhoids   . History of hiatal hernia   . Hypercalcemia   . Hyperlipidemia   . Hypertension   . Hypothyroidism   . Lumbar stenosis   . Obesity   . Peptic ulcer disease   . Shortness of breath dyspnea   . Sleep apnea   . Vertigo   . Vitamin D deficiency     Patient Active Problem List   Diagnosis Date Noted  . Lumbar spondylosis 06/01/2017  . Lumbar degenerative disc  disease 06/01/2017  . History of lumbar fusion 06/01/2017  . 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 Chronic pain syndrome 06/01/2017  . 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 ABDOMINAL HYSTERECTOMY    . BACK SURGERY  2007   Dr. Mauri Pole, [facility], Spinal Fusion  . CARDIAC CATHETERIZATION N/A 04/03/2015   Procedure: Left Heart Cath and Coronary Angiography;  Surgeon: Teodoro Spray, MD;  Location: [place] CV [facility];  Service: Cardiovascular;  Laterality: N/A;  . CHOLECYSTECTOMY    . CORONARY ANGIOPLASTY  2015   Dr. Ubaldo Glassing, [facility] Cath Lab  . ESOPHAGOGASTRODUODENOSCOPY (EGD) WITH PROPOFOL N/A 12/21/2016   Procedure: ESOPHAGOGASTRODUODENOSCOPY (EGD) WITH PROPOFOL;  Surgeon: Manya Silvas, MD;  Location: [facility] ENDOSCOPY;  Service: Endoscopy;  Laterality: N/A;  . FRACTURE SURGERY Left 1991   Fractures Femur, [place] Bilateral 09/28/2015   Procedure: IMAGE GUIDED SINUS SURGERY, SEPTOPLASTY, BILATERAL FRONTAL SINUSOTOMIES, BILATERAL MAXILLARY ANTROSTOMIES, BILATERAL TOTAL ETHMOIDECTOMY, BILATERAL SPHENOIDECTOMY, BILATERAL INFERIOR TURBINATE REDUCTION;  Surgeon: Margaretha Sheffield, MD;  Location: [facility] ORS;  Service: ENT;  Laterality: Bilateral;    OB History   No obstetric history on file.      Home Medications    Prior to Admission medications   Medication Sig Start Date End Date Taking? Authorizing Provider  albuterol (PROVENTIL HFA;VENTOLIN HFA) 108 (90 BASE) MCG/ACT inhaler Inhale 2 puffs into the lungs.   Yes [provider]  cetirizine (ZYRTEC) 10 MG tablet Take 1 tablet (10 mg total) by mouth daily. If needed at night for itching not relieved by Claritin in the morning. 02/14/17  Yes Frederich Cha, MD  chlorthalidone (HYGROTON) 25 MG tablet Take 25 mg by mouth daily.   Yes [provider]  citalopram (CELEXA) 40 MG tablet Take 40 mg by mouth daily.   Yes [provider]  clonazePAM  (KLONOPIN) 1 MG tablet Take 1 mg by mouth 2 (two) times daily. As needed for anxiety   Yes [provider]  enalapril (VASOTEC) 20 MG tablet Take 20 mg by mouth daily.   Yes [provider]  gabapentin (NEURONTIN) 300 MG capsule Take 600 mg by mouth 3 (three) times daily. Two tabs to total 600 mgm by mouth 3 times daily   Yes [provider]  levothyroxine (SYNTHROID, LEVOTHROID) 112 MCG tablet Take 137 mcg by mouth daily before breakfast.    Yes [provider]  metoprolol tartrate (LOPRESSOR) 25 MG tablet Take 12.5 mg by mouth 2 (two) times daily.    Yes [provider]  naproxen sodium (ALEVE) 220 MG tablet Take 220 mg by mouth.   Yes [provider]  omeprazole (PRILOSEC) 20 MG capsule Take 40 mg by mouth 2 (two) times daily before a meal.    Yes [provider]  amLODipine (NORVASC) 5 MG tablet Take 5 mg by mouth 2 (two) times daily.  02/17/19 Yes [provider]  acetaminophen (TYLENOL) 500 MG tablet Take 500 mg by mouth every 6 (six) hours as needed.    [provider]  diclofenac sodium (VOLTAREN) 1 % GEL Apply 2 g topically 4 (four) times daily.    [provider]  ergocalciferol (VITAMIN D2) 50000 UNITS capsule Take 50,000 Units by mouth every 30 (thirty) days.    [provider]  fluticasone (VERAMYST) 27.5 MCG/SPRAY nasal spray Place 2 sprays into the nose daily.    [provider]  meclizine (ANTIVERT) 25 MG tablet Take 25 mg by mouth 3 (three) times daily as needed for dizziness.    [provider]  nitroGLYCERIN (NITROSTAT) 0.4 MG SL tablet Place 0.4 mg under the tongue every 5 (five) minutes as needed for chest pain.    [provider]  loratadine (CLARITIN) 10 MG tablet Take 1 tablet (10 mg total) by mouth daily. Take 1 tablet in the morning. As needed for itching. 02/14/17 02/17/19  Frederich Cha, MD  ranitidine (ZANTAC) 150 MG capsule Take 1 capsule (150 mg  total) by mouth 2 (two) times daily. 02/14/17 02/17/19  Frederich Cha, MD    Family History Family History  Problem Relation Age of Onset  . Hypertension Mother   . Cancer Mother   . Diabetes Father   . Hypertension Father   . Heart failure Father   . Breast cancer Neg Hx     Social History Social History   Tobacco Use  . Smoking status: Former Research scientist (life sciences)  . Smokeless tobacco: Never Used  Substance Use Topics  . Alcohol use:  No  . Drug use: No     Allergies   Zithromax [azithromycin], Adhesive [tape], Codeine, Lactose intolerance (gi), Lodine [etodolac], Morphine and related, Red dye, Shellfish-derived products, and Latex   Review of Systems Review of Systems  Constitutional: Positive for activity change, appetite change, chills and fatigue. Negative for diaphoresis and fever.  HENT: Positive for sore throat. Negative for trouble swallowing and voice change.   Respiratory: Positive for cough.   All other systems reviewed and are negative.    Physical Exam Triage Vital Signs ED Triage Vitals  Enc Vitals Group     BP 02/17/19 0859 (!) 159/95     Pulse Rate 02/17/19 0859 64     Resp 02/17/19 0859 16     Temp 02/17/19 0859 98.3 F (36.8 C)     Temp Source 02/17/19 0859 Oral     SpO2 02/17/19 0859 100 %     Weight 02/17/19 0854 227 lb (103 kg)     Height 02/17/19 0854 5\' 3"  (1.6 m)     Head Circumference --      Peak Flow --      Pain Score --      Pain Loc --      Pain Edu? --      Excl. in [place]? --    No data found.  Updated Vital Signs BP (!) 148/98   Pulse 64   Temp 98.3 F (36.8 C) (Oral)   Resp 16   Ht 5\' 3"  (1.6 m)   Wt 227 lb (103 kg)   SpO2 100%   BMI 40.21 kg/m   Visual Acuity Right Eye Distance:   Left Eye Distance:   Bilateral Distance:    Right Eye Near:   Left Eye Near:    Bilateral Near:     Physical Exam Vitals signs and nursing note reviewed.  Constitutional:      General: She is not in acute distress.    Appearance: She is  well-developed. She is obese. She is not ill-appearing, toxic-appearing or diaphoretic.  HENT:     Head: Normocephalic and atraumatic.     Right Ear: Tympanic membrane and ear canal normal.     Left Ear: Tympanic membrane and ear canal normal.     Nose: No congestion or rhinorrhea.     Mouth/Throat:     Mouth: Mucous membranes are moist. No oral lesions.     Pharynx: Oropharynx is clear. No pharyngeal swelling, oropharyngeal exudate, posterior oropharyngeal erythema or uvula swelling.     Tonsils: No tonsillar exudate or tonsillar abscesses.  Eyes:     Conjunctiva/sclera: Conjunctivae normal.     Pupils: Pupils are equal, round, and reactive to light.  Neck:     Musculoskeletal: Normal range of motion and neck supple.  Pulmonary:     Effort: Pulmonary effort is normal. No respiratory distress.     Breath sounds: Normal breath sounds. No stridor. No wheezing, rhonchi or rales.  Chest:     Chest wall: Tenderness present.  Abdominal:     General: Bowel sounds are normal.     Palpations: Abdomen is soft.  Skin:    General: Skin is warm and dry.     Comments: Examination of the left lateral chest reveals a contused area just inferior to the axilla in the mid axillary to anterior axillary line.  This is tender to the touch.  There is no crepitus present.  Neurological:     General: No focal deficit present.  Mental Status: She is alert and oriented to person, place, and time.  Psychiatric:        Mood and Affect: Mood normal.        Behavior: Behavior normal.      UC Treatments / Results  Labs (all labs ordered are listed, but only abnormal results are displayed) Labs Reviewed  RAPID STREP SCREEN ([facility] ONLY)  NOVEL CORONAVIRUS, NAA (HOSPITAL ORDER, SEND-OUT TO REF LAB)  CULTURE, GROUP A STREP [facility])    EKG   Radiology Dg Ribs Unilateral W/chest Left  Result Date: 02/17/2019 CLINICAL DATA:  Sore throat with chills and body aches. EXAM: LEFT RIBS AND CHEST - 3+  VIEW COMPARISON:  Rib detail 06/23/2017.  Chest x-ray 04/06/2012 FINDINGS: The lungs are clear without focal pneumonia, edema, pneumothorax or pleural effusion. Cardiopericardial silhouette is at upper limits of normal for size. Moderate to large hiatal hernia again noted. Radio-opaque marker has been placed on the skin at the site of patient concern. No evidence for an acute displaced left rib fracture. IMPRESSION: Negative. Electronically Signed   By: Misty Stanley M.D.   On: 02/17/2019 10:32    Procedures Procedures (including critical care time)  Medications Ordered in UC Medications - No data to display  Initial Impression / Assessment and Plan / UC Course  I have reviewed the triage vital signs and the nursing notes.  Pertinent labs & imaging results that were available during my care of the patient were reviewed by me and considered in my medical decision making (see chart for details).   Asked with the patient my findings today and have told her that she likely has a contusion of the left chest wall or fall against the doorknob.  She will need to cough and deep breathe frequently to prevent a pneumonitis or pneumonia.  Tested for COVID as well as drip.  The results and cultures will be available in 2 to 5 days.  She will continue using her Aleve as necessary for the chest pain.  She will apply ice or heat for comfort.   Final Clinical Impressions(s) / UC Diagnoses   Final diagnoses:  Viral upper respiratory tract infection  Rib contusion, left, initial encounter     Discharge Instructions     Apply ice 20 minutes out of every 2 hours 4-5 times daily for comfort.  Cough and deep breathe frequently to prevent lung infection or irritation.  Continue to use your Aleve as necessary for body aches as well as a rib pain.  Follow-up with your primary care physician if you are not improving or worsen.  The results of your strep culture will be available in 2 days.  COVID testing will  sometimes take up to 5 to 7days.    ED Prescriptions    None     Controlled Substance Prescriptions [place] Controlled Substance Registry consulted? Not Applicable   Lorin Picket, PA-C 02/17/19 1707

## 2019-02-17 NOTE — Discharge Instructions (Addendum)
Apply ice 20 minutes out of every 2 hours 4-5 times daily for comfort.  Cough and deep breathe frequently to prevent lung infection or irritation.  Continue to use your Aleve as necessary for body aches as well as a rib pain.  Follow-up with your primary care physician if you are not improving or worsen.  The results of your strep culture will be available in 2 days.  COVID testing will sometimes take up to 5 to 7days.

## 2019-02-17 NOTE — ED Triage Notes (Signed)
Patient c/o sore throat, chills, bodyaches, and cough since Friday.  Patient denies fevers.  Patient sates that she fell and hit the left side of her ribcage on the door knob on Friday and c/o left sided rib pain.

## 2019-02-19 LAB — NOVEL CORONAVIRUS, NAA (HOSP ORDER, SEND-OUT TO REF LAB; TAT 18-24 HRS): SARS-CoV-2, NAA: NOT DETECTED

## 2019-02-20 LAB — CULTURE, GROUP A STREP (THRC)

## 2019-04-01 ENCOUNTER — Other Ambulatory Visit: Payer: Self-pay | Admitting: Orthopedic Surgery

## 2019-04-01 DIAGNOSIS — M1732 Unilateral post-traumatic osteoarthritis, left knee: Secondary | ICD-10-CM

## 2019-04-08 ENCOUNTER — Encounter (INDEPENDENT_AMBULATORY_CARE_PROVIDER_SITE_OTHER): Payer: Self-pay

## 2019-04-08 ENCOUNTER — Ambulatory Visit
Admission: RE | Admit: 2019-04-08 | Discharge: 2019-04-08 | Disposition: A | Discharge status: Home / Self Care | Payer: Medicare HMO | Source: Ambulatory Visit | Attending: Orthopedic Surgery | Admitting: Orthopedic Surgery

## 2019-04-08 ENCOUNTER — Other Ambulatory Visit: Payer: Self-pay

## 2019-04-08 DIAGNOSIS — M1732 Unilateral post-traumatic osteoarthritis, left knee: Secondary | ICD-10-CM | POA: Insufficient documentation

## 2019-04-15 ENCOUNTER — Encounter: Payer: Self-pay | Admitting: *Deleted

## 2019-04-15 ENCOUNTER — Other Ambulatory Visit: Payer: Self-pay

## 2019-04-18 ENCOUNTER — Other Ambulatory Visit: Payer: Self-pay

## 2019-04-18 ENCOUNTER — Other Ambulatory Visit
Admission: RE | Admit: 2019-04-18 | Discharge: 2019-04-18 | Disposition: A | Discharge status: Home / Self Care | Payer: Medicare HMO | Source: Ambulatory Visit | Attending: Ophthalmology | Admitting: Ophthalmology

## 2019-04-18 DIAGNOSIS — Z01812 Encounter for preprocedural laboratory examination: Secondary | ICD-10-CM | POA: Insufficient documentation

## 2019-04-18 DIAGNOSIS — Z20828 Contact with and (suspected) exposure to other viral communicable diseases: Secondary | ICD-10-CM | POA: Insufficient documentation

## 2019-04-18 LAB — SARS CORONAVIRUS 2 (TAT 6-24 HRS): SARS Coronavirus 2: NEGATIVE

## 2019-04-18 NOTE — Discharge Instructions (Signed)

## 2019-04-22 ENCOUNTER — Other Ambulatory Visit: Payer: Self-pay

## 2019-04-22 ENCOUNTER — Ambulatory Visit: Payer: Medicare HMO | Admitting: Anesthesiology

## 2019-04-22 ENCOUNTER — Ambulatory Visit
Admission: RE | Admit: 2019-04-22 | Discharge: 2019-04-22 | Disposition: A | Discharge status: Home / Self Care | Payer: Medicare HMO | Attending: Ophthalmology | Admitting: Ophthalmology

## 2019-04-22 ENCOUNTER — Encounter
Admission: RE | Disposition: A | Discharge status: Home / Self Care | Payer: Self-pay | Source: Home / Self Care | Attending: Ophthalmology

## 2019-04-22 DIAGNOSIS — Z981 Arthrodesis status: Secondary | ICD-10-CM | POA: Diagnosis not present

## 2019-04-22 DIAGNOSIS — Z885 Allergy status to narcotic agent status: Secondary | ICD-10-CM | POA: Diagnosis not present

## 2019-04-22 DIAGNOSIS — Z87891 Personal history of nicotine dependence: Secondary | ICD-10-CM | POA: Insufficient documentation

## 2019-04-22 DIAGNOSIS — R0601 Orthopnea: Secondary | ICD-10-CM | POA: Insufficient documentation

## 2019-04-22 DIAGNOSIS — K219 Gastro-esophageal reflux disease without esophagitis: Secondary | ICD-10-CM | POA: Diagnosis not present

## 2019-04-22 DIAGNOSIS — G709 Myoneural disorder, unspecified: Secondary | ICD-10-CM | POA: Diagnosis not present

## 2019-04-22 DIAGNOSIS — F329 Major depressive disorder, single episode, unspecified: Secondary | ICD-10-CM | POA: Diagnosis not present

## 2019-04-22 DIAGNOSIS — G4730 Sleep apnea, unspecified: Secondary | ICD-10-CM | POA: Diagnosis not present

## 2019-04-22 DIAGNOSIS — Z90710 Acquired absence of both cervix and uterus: Secondary | ICD-10-CM | POA: Insufficient documentation

## 2019-04-22 DIAGNOSIS — G2581 Restless legs syndrome: Secondary | ICD-10-CM | POA: Diagnosis not present

## 2019-04-22 DIAGNOSIS — E039 Hypothyroidism, unspecified: Secondary | ICD-10-CM | POA: Diagnosis not present

## 2019-04-22 DIAGNOSIS — G629 Polyneuropathy, unspecified: Secondary | ICD-10-CM | POA: Insufficient documentation

## 2019-04-22 DIAGNOSIS — F419 Anxiety disorder, unspecified: Secondary | ICD-10-CM | POA: Diagnosis not present

## 2019-04-22 DIAGNOSIS — Z6841 Body Mass Index (BMI) 40.0 and over, adult: Secondary | ICD-10-CM | POA: Diagnosis not present

## 2019-04-22 DIAGNOSIS — I10 Essential (primary) hypertension: Secondary | ICD-10-CM | POA: Insufficient documentation

## 2019-04-22 DIAGNOSIS — K449 Diaphragmatic hernia without obstruction or gangrene: Secondary | ICD-10-CM | POA: Insufficient documentation

## 2019-04-22 DIAGNOSIS — Z888 Allergy status to other drugs, medicaments and biological substances status: Secondary | ICD-10-CM | POA: Diagnosis not present

## 2019-04-22 DIAGNOSIS — Z9049 Acquired absence of other specified parts of digestive tract: Secondary | ICD-10-CM | POA: Insufficient documentation

## 2019-04-22 DIAGNOSIS — I25119 Atherosclerotic heart disease of native coronary artery with unspecified angina pectoris: Secondary | ICD-10-CM | POA: Insufficient documentation

## 2019-04-22 DIAGNOSIS — Z91048 Other nonmedicinal substance allergy status: Secondary | ICD-10-CM | POA: Insufficient documentation

## 2019-04-22 DIAGNOSIS — H2512 Age-related nuclear cataract, left eye: Secondary | ICD-10-CM | POA: Insufficient documentation

## 2019-04-22 DIAGNOSIS — Z955 Presence of coronary angioplasty implant and graft: Secondary | ICD-10-CM | POA: Diagnosis not present

## 2019-04-22 DIAGNOSIS — Z881 Allergy status to other antibiotic agents status: Secondary | ICD-10-CM | POA: Diagnosis not present

## 2019-04-22 HISTORY — PX: CATARACT EXTRACTION W/PHACO: SHX586

## 2019-04-22 HISTORY — DX: Other complications of anesthesia, initial encounter: T88.59XA

## 2019-04-22 SURGERY — PHACOEMULSIFICATION, CATARACT, WITH IOL INSERTION
Anesthesia: Monitor Anesthesia Care | Site: Eye | Laterality: Left

## 2019-04-22 MED ORDER — ACETAMINOPHEN 160 MG/5ML PO SOLN: 325.0000 mg | Freq: Once | ORAL | Status: DC

## 2019-04-22 MED ORDER — MIDAZOLAM HCL 2 MG/2ML IJ SOLN
INTRAMUSCULAR | Status: DC | PRN
  Administered 2019-04-22: 1 mg via INTRAVENOUS

## 2019-04-22 MED ORDER — ACETAMINOPHEN 325 MG PO TABS: 325.0000 mg | ORAL_TABLET | Freq: Once | ORAL | Status: DC

## 2019-04-22 MED ORDER — ARMC OPHTHALMIC DILATING DROPS
1.0000 "application " | Freq: Once | OPHTHALMIC | Status: AC
  Administered 2019-04-22: 1 via OPHTHALMIC

## 2019-04-22 MED ORDER — TETRACAINE HCL 0.5 % OP SOLN
1.0000 [drp] | OPHTHALMIC | Status: DC | PRN
  Administered 2019-04-22 (×3): 1 [drp] via OPHTHALMIC

## 2019-04-22 MED ORDER — ARMC OPHTHALMIC DILATING DROPS
1.0000 "application " | OPHTHALMIC | Status: DC | PRN
  Administered 2019-04-22 (×2): 1 via OPHTHALMIC

## 2019-04-22 MED ORDER — LACTATED RINGERS IV SOLN: INTRAVENOUS | Status: DC

## 2019-04-22 MED ORDER — FENTANYL CITRATE (PF) 100 MCG/2ML IJ SOLN
INTRAMUSCULAR | Status: DC | PRN
  Administered 2019-04-22: 50 ug via INTRAVENOUS

## 2019-04-22 MED ORDER — LIDOCAINE HCL (PF) 2 % IJ SOLN
INTRAOCULAR | Status: DC | PRN
  Administered 2019-04-22: 1 mL via INTRAOCULAR

## 2019-04-22 MED ORDER — EPINEPHRINE PF 1 MG/ML IJ SOLN
INTRAOCULAR | Status: DC | PRN
  Administered 2019-04-22: 78 mL via OPHTHALMIC

## 2019-04-22 SURGICAL SUPPLY — 19 items
CANNULA ANT/CHMB 27G (MISCELLANEOUS) ×2 IMPLANT
CANNULA ANT/CHMB 27GA (MISCELLANEOUS) ×4 IMPLANT
DISSECTOR HYDRO NUCLEUS 50X22 (MISCELLANEOUS) ×2 IMPLANT
GLOVE SURG LX 7.5 STRW (GLOVE) ×1
GLOVE SURG LX STRL 7.5 STRW (GLOVE) ×1 IMPLANT
GLOVE SURG SYN 8.5  E (GLOVE) ×1
GLOVE SURG SYN 8.5 E (GLOVE) ×1 IMPLANT
GLOVE SURG SYN 8.5 PF PI (GLOVE) ×1 IMPLANT
GOWN STRL REUS W/ TWL LRG LVL3 (GOWN DISPOSABLE) ×2 IMPLANT
GOWN STRL REUS W/TWL LRG LVL3 (GOWN DISPOSABLE) ×2
LENS IOL TECNIS ITEC 18.5 (Intraocular Lens) ×1 IMPLANT
MARKER SKIN DUAL TIP RULER LAB (MISCELLANEOUS) ×2 IMPLANT
PACK DR. KING ARMS (PACKS) ×2 IMPLANT
PACK EYE AFTER SURG (MISCELLANEOUS) ×2 IMPLANT
PACK OPTHALMIC (MISCELLANEOUS) ×2 IMPLANT
SYR 3ML LL SCALE MARK (SYRINGE) ×2 IMPLANT
SYR TB 1ML LUER SLIP (SYRINGE) ×2 IMPLANT
WATER STERILE IRR 250ML POUR (IV SOLUTION) ×2 IMPLANT
WIPE NON LINTING 3.25X3.25 (MISCELLANEOUS) ×2 IMPLANT

## 2019-04-22 NOTE — Transfer of Care (Signed)
Immediate Anesthesia Transfer of Care Note  Patient: Stacy Allen  Procedure(s) Performed: CATARACT EXTRACTION PHACO AND INTRAOCULAR LENS PLACEMENT (IOC) LEFT  00:45.4  12.9%  6.03 (Left Eye)  Patient Location: PACU  Anesthesia Type: MAC  Level of Consciousness: awake, alert  and patient cooperative  Airway and Oxygen Therapy: Patient Spontanous Breathing and Patient connected to supplemental oxygen  Post-op Assessment: Post-op Vital signs reviewed, [name] Cardiovascular Status Stable, Respiratory Function Stable, Patent Airway and No signs of Nausea or vomiting  Post-op Vital Signs: Reviewed and stable  Complications: No apparent anesthesia complications

## 2019-04-22 NOTE — H&P (Signed)

## 2019-04-22 NOTE — Op Note (Signed)
OPERATIVE NOTE  Stacy Allen [id number] 04/22/2019   PREOPERATIVE DIAGNOSIS:  Nuclear sclerotic cataract left eye.  H25.12   POSTOPERATIVE DIAGNOSIS:    Nuclear sclerotic cataract left eye.     PROCEDURE:  Phacoemusification with posterior chamber intraocular lens placement of the left eye   LENS:   Implant Name Type Inv. Item Serial No. Manufacturer Lot No. LRB No. Used Action  LENS IOL DIOP 18.5 - [id number] Intraocular Lens LENS IOL DIOP 18.5 [id number]  Left 1 Implanted      Procedure(s) with comments: CATARACT EXTRACTION PHACO AND INTRAOCULAR LENS PLACEMENT (IOC) LEFT  00:45.4  12.9%  6.03 (Left) - Latex sleep apnea  PCB00 +18.5   ULTRASOUND TIME: 0 minutes 45.4 seconds.  CDE 6.03   SURGEON:  Benay Pillow, MD, MPH   ANESTHESIA:  Topical with tetracaine drops augmented with 1% preservative-free intracameral lidocaine.  ESTIMATED BLOOD LOSS: <1 mL   COMPLICATIONS:  None.   DESCRIPTION OF PROCEDURE:  The patient was identified in the holding room and transported to the operating room and placed in the supine position under the operating microscope.  The left eye was identified as the operative eye and it was prepped and draped in the usual sterile ophthalmic fashion.   A 1.0 millimeter clear-corneal paracentesis was made at the 5:00 position. 0.5 ml of preservative-free 1% lidocaine with epinephrine was injected into the anterior chamber.  The anterior chamber was filled with Healon 5 viscoelastic.  A 2.4 millimeter keratome was used to make a near-clear corneal incision at the 2:00 position.  A curvilinear capsulorrhexis was made with a cystotome and capsulorrhexis forceps.  Balanced salt solution was used to hydrodissect and hydrodelineate the nucleus.   Phacoemulsification was then used in stop and chop fashion to remove the lens nucleus and epinucleus.  The remaining cortex was then removed using the irrigation and aspiration handpiece. Healon was then placed into  the capsular bag to distend it for lens placement.  A lens was then injected into the capsular bag.  The remaining viscoelastic was aspirated.   Wounds were hydrated with balanced salt solution.  The anterior chamber was inflated to a physiologic pressure with balanced salt solution.  Intracameral vigamox 0.1 mL undiltued was injected into the eye and a drop placed onto the ocular surface.  No wound leaks were noted.  The patient was taken to the recovery room in stable condition without complications of anesthesia or surgery  Benay Pillow 04/22/2019, 10:05 AM

## 2019-04-22 NOTE — Anesthesia Postprocedure Evaluation (Signed)
Anesthesia Post Note  Patient: Stacy Allen  Procedure(s) Performed: CATARACT EXTRACTION PHACO AND INTRAOCULAR LENS PLACEMENT (IOC) LEFT  00:45.4  12.9%  6.03 (Left Eye)  Patient location during evaluation: PACU Anesthesia Type: MAC Level of consciousness: awake and alert and oriented Pain management: satisfactory to patient Vital Signs Assessment: post-procedure vital signs reviewed and stable Respiratory status: spontaneous breathing, nonlabored ventilation and respiratory function stable Cardiovascular status: blood pressure returned to baseline and stable Postop Assessment: Adequate PO intake and No signs of nausea or vomiting Anesthetic complications: no    Raliegh Ip

## 2019-04-22 NOTE — Anesthesia Preprocedure Evaluation (Signed)
Anesthesia Evaluation  Patient identified by MRN, date of birth, ID band Patient awake    Reviewed: Allergy & Precautions, H&P , NPO status , [name] Chart, lab work & pertinent test results  Airway Mallampati: II  TM Distance: >3 FB Neck ROM: full    Dental no notable dental hx.    Pulmonary sleep apnea , former smoker,    Pulmonary exam normal breath sounds clear to auscultation       Cardiovascular hypertension, + angina + CAD  Normal cardiovascular exam Rhythm:regular Rate:Normal     Neuro/Psych  Neuromuscular disease    GI/Hepatic GERD  ,  Endo/Other  Hypothyroidism Morbid obesity  Renal/GU      Musculoskeletal   Abdominal   Peds  Hematology   Anesthesia Other Findings   Reproductive/Obstetrics                             Anesthesia Physical Anesthesia Plan  ASA: III  Anesthesia Plan: MAC   Post-op Pain Management:    Induction:   PONV Risk Score and Plan: 2 and Midazolam, TIVA and Treatment may vary due to age or medical condition  Airway Management Planned:   Additional Equipment:   Intra-op Plan:   Post-operative Plan:   Informed Consent: I have reviewed the patients History and Physical, chart, labs and discussed the procedure including the risks, benefits and alternatives for the proposed anesthesia with the patient or authorized representative who has indicated his/her understanding and acceptance.       Plan Discussed with: CRNA  Anesthesia Plan Comments:         Anesthesia Quick Evaluation

## 2019-04-23 ENCOUNTER — Encounter: Payer: Self-pay | Admitting: Ophthalmology

## 2019-06-18 ENCOUNTER — Other Ambulatory Visit: Payer: Self-pay

## 2019-06-18 ENCOUNTER — Encounter: Payer: Self-pay | Admitting: Ophthalmology

## 2019-06-25 NOTE — Discharge Instructions (Signed)

## 2019-06-27 ENCOUNTER — Other Ambulatory Visit
Admission: RE | Admit: 2019-06-27 | Discharge: 2019-06-27 | Disposition: A | Discharge status: Home / Self Care | Payer: Medicare HMO | Source: Ambulatory Visit | Attending: Ophthalmology | Admitting: Ophthalmology

## 2019-06-27 DIAGNOSIS — Z20828 Contact with and (suspected) exposure to other viral communicable diseases: Secondary | ICD-10-CM | POA: Diagnosis not present

## 2019-06-27 DIAGNOSIS — Z01812 Encounter for preprocedural laboratory examination: Secondary | ICD-10-CM | POA: Insufficient documentation

## 2019-06-27 LAB — SARS CORONAVIRUS 2 (TAT 6-24 HRS): SARS Coronavirus 2: NEGATIVE

## 2019-07-01 ENCOUNTER — Ambulatory Visit: Payer: Medicare HMO | Admitting: Anesthesiology

## 2019-07-01 ENCOUNTER — Encounter
Admission: RE | Disposition: A | Discharge status: Home / Self Care | Payer: Self-pay | Source: Home / Self Care | Attending: Ophthalmology

## 2019-07-01 ENCOUNTER — Ambulatory Visit
Admission: RE | Admit: 2019-07-01 | Discharge: 2019-07-01 | Disposition: A | Discharge status: Home / Self Care | Payer: Medicare HMO | Attending: Ophthalmology | Admitting: Ophthalmology

## 2019-07-01 ENCOUNTER — Other Ambulatory Visit: Payer: Self-pay

## 2019-07-01 ENCOUNTER — Encounter: Payer: Self-pay | Admitting: Ophthalmology

## 2019-07-01 DIAGNOSIS — Z6841 Body Mass Index (BMI) 40.0 and over, adult: Secondary | ICD-10-CM | POA: Insufficient documentation

## 2019-07-01 DIAGNOSIS — Z955 Presence of coronary angioplasty implant and graft: Secondary | ICD-10-CM | POA: Diagnosis not present

## 2019-07-01 DIAGNOSIS — I10 Essential (primary) hypertension: Secondary | ICD-10-CM | POA: Insufficient documentation

## 2019-07-01 DIAGNOSIS — Z79899 Other long term (current) drug therapy: Secondary | ICD-10-CM | POA: Diagnosis not present

## 2019-07-01 DIAGNOSIS — H2511 Age-related nuclear cataract, right eye: Secondary | ICD-10-CM | POA: Insufficient documentation

## 2019-07-01 DIAGNOSIS — G4730 Sleep apnea, unspecified: Secondary | ICD-10-CM | POA: Insufficient documentation

## 2019-07-01 DIAGNOSIS — I2510 Atherosclerotic heart disease of native coronary artery without angina pectoris: Secondary | ICD-10-CM | POA: Diagnosis not present

## 2019-07-01 DIAGNOSIS — J449 Chronic obstructive pulmonary disease, unspecified: Secondary | ICD-10-CM | POA: Insufficient documentation

## 2019-07-01 DIAGNOSIS — E039 Hypothyroidism, unspecified: Secondary | ICD-10-CM | POA: Diagnosis not present

## 2019-07-01 DIAGNOSIS — Z87891 Personal history of nicotine dependence: Secondary | ICD-10-CM | POA: Insufficient documentation

## 2019-07-01 DIAGNOSIS — Z79890 Hormone replacement therapy (postmenopausal): Secondary | ICD-10-CM | POA: Insufficient documentation

## 2019-07-01 DIAGNOSIS — G629 Polyneuropathy, unspecified: Secondary | ICD-10-CM | POA: Diagnosis not present

## 2019-07-01 DIAGNOSIS — K219 Gastro-esophageal reflux disease without esophagitis: Secondary | ICD-10-CM | POA: Diagnosis not present

## 2019-07-01 HISTORY — PX: CATARACT EXTRACTION W/PHACO: SHX586

## 2019-07-01 SURGERY — PHACOEMULSIFICATION, CATARACT, WITH IOL INSERTION
Anesthesia: Monitor Anesthesia Care | Site: Eye | Laterality: Right

## 2019-07-01 MED ORDER — MIDAZOLAM HCL 2 MG/2ML IJ SOLN
INTRAMUSCULAR | Status: DC | PRN
  Administered 2019-07-01: 2 mg via INTRAVENOUS

## 2019-07-01 MED ORDER — EPINEPHRINE PF 1 MG/ML IJ SOLN
INTRAOCULAR | Status: DC | PRN
  Administered 2019-07-01: 73 mL via OPHTHALMIC

## 2019-07-01 MED ORDER — TETRACAINE HCL 0.5 % OP SOLN
1.0000 [drp] | OPHTHALMIC | Status: DC | PRN
  Administered 2019-07-01 (×3): 1 [drp] via OPHTHALMIC

## 2019-07-01 MED ORDER — MOXIFLOXACIN HCL 0.5 % OP SOLN
OPHTHALMIC | Status: DC | PRN
  Administered 2019-07-01: 0.2 mL via OPHTHALMIC

## 2019-07-01 MED ORDER — ARMC OPHTHALMIC DILATING DROPS
1.0000 "application " | OPHTHALMIC | Status: DC | PRN
  Administered 2019-07-01 (×3): 1 via OPHTHALMIC

## 2019-07-01 MED ORDER — FENTANYL CITRATE (PF) 100 MCG/2ML IJ SOLN
INTRAMUSCULAR | Status: DC | PRN
  Administered 2019-07-01: 50 ug via INTRAVENOUS

## 2019-07-01 MED ORDER — SODIUM HYALURONATE 10 MG/ML IO SOLN
INTRAOCULAR | Status: DC | PRN
  Administered 2019-07-01: 0.55 mL via INTRAOCULAR

## 2019-07-01 MED ORDER — SODIUM HYALURONATE 23 MG/ML IO SOLN
INTRAOCULAR | Status: DC | PRN
  Administered 2019-07-01: 0.6 mL via INTRAOCULAR

## 2019-07-01 MED ORDER — LIDOCAINE HCL (PF) 2 % IJ SOLN
INTRAOCULAR | Status: DC | PRN
  Administered 2019-07-01: 1 mL via INTRAOCULAR

## 2019-07-01 SURGICAL SUPPLY — 19 items
CANNULA ANT/CHMB 27G (MISCELLANEOUS) ×2 IMPLANT
CANNULA ANT/CHMB 27GA (MISCELLANEOUS) ×4 IMPLANT
DISSECTOR HYDRO NUCLEUS 50X22 (MISCELLANEOUS) ×2 IMPLANT
GLOVE SURG LX 7.5 STRW (GLOVE) ×1
GLOVE SURG LX STRL 7.5 STRW (GLOVE) ×1 IMPLANT
GLOVE SURG SYN 8.5  E (GLOVE) ×1
GLOVE SURG SYN 8.5 E (GLOVE) ×1 IMPLANT
GLOVE SURG SYN 8.5 PF PI (GLOVE) ×1 IMPLANT
GOWN STRL REUS W/ TWL LRG LVL3 (GOWN DISPOSABLE) ×2 IMPLANT
GOWN STRL REUS W/TWL LRG LVL3 (GOWN DISPOSABLE) ×2
LENS IOL TECNIS ITEC 19.0 (Intraocular Lens) ×1 IMPLANT
MARKER SKIN DUAL TIP RULER LAB (MISCELLANEOUS) ×2 IMPLANT
PACK DR. KING ARMS (PACKS) ×2 IMPLANT
PACK EYE AFTER SURG (MISCELLANEOUS) ×2 IMPLANT
PACK OPTHALMIC (MISCELLANEOUS) ×2 IMPLANT
SYR 3ML LL SCALE MARK (SYRINGE) ×2 IMPLANT
SYR TB 1ML LUER SLIP (SYRINGE) ×2 IMPLANT
WATER STERILE IRR 250ML POUR (IV SOLUTION) ×2 IMPLANT
WIPE NON LINTING 3.25X3.25 (MISCELLANEOUS) ×2 IMPLANT

## 2019-07-01 NOTE — Transfer of Care (Signed)
Immediate Anesthesia Transfer of Care Note  Patient: Stacy Allen  Procedure(s) Performed: CATARACT EXTRACTION PHACO AND INTRAOCULAR LENS PLACEMENT (IOC) RIGHT 2.38  00:24.4 (Right Eye)  Patient Location: PACU  Anesthesia Type: MAC  Level of Consciousness: awake, alert  and patient cooperative  Airway and Oxygen Therapy: Patient Spontanous Breathing and Patient connected to supplemental oxygen  Post-op Assessment: Post-op Vital signs reviewed, [name] Cardiovascular Status Stable, Respiratory Function Stable, Patent Airway and No signs of Nausea or vomiting  Post-op Vital Signs: Reviewed and stable  Complications: No apparent anesthesia complications

## 2019-07-01 NOTE — H&P (Signed)

## 2019-07-01 NOTE — Op Note (Signed)
OPERATIVE NOTE  Stacy Allen [id number] 07/01/2019   PREOPERATIVE DIAGNOSIS:  Nuclear sclerotic cataract right eye.  H25.11   POSTOPERATIVE DIAGNOSIS:    Nuclear sclerotic cataract right eye.     PROCEDURE:  Phacoemusification with posterior chamber intraocular lens placement of the right eye   LENS:   Implant Name Type Inv. Item Serial No. Manufacturer Lot No. LRB No. Used Action  LENS IOL DIOP 19.0 - [id number] Intraocular Lens LENS IOL DIOP 19.0 [id number]  Right 1 Implanted       Procedure(s) with comments: CATARACT EXTRACTION PHACO AND INTRAOCULAR LENS PLACEMENT (IOC) RIGHT 2.38  00:24.4 (Right) - Latex Sleep apnea  PCB00 +19.0   ULTRASOUND TIME: 0 minutes 24 seconds.  CDE 2.38   SURGEON:  Benay Pillow, MD, MPH  ANESTHESIOLOGIST: Anesthesiologist: Page, Adele Barthel, MD CRNA: Mayme Genta, CRNA   ANESTHESIA:  Topical with tetracaine drops augmented with 1% preservative-free intracameral lidocaine.  ESTIMATED BLOOD LOSS: less than 1 mL.   COMPLICATIONS:  None.   DESCRIPTION OF PROCEDURE:  The patient was identified in the holding room and transported to the operating room and placed in the supine position under the operating microscope.  The right eye was identified as the operative eye and it was prepped and draped in the usual sterile ophthalmic fashion.   A 1.0 millimeter clear-corneal paracentesis was made at the 10:30 position. 0.5 ml of preservative-free 1% lidocaine with epinephrine was injected into the anterior chamber.  The anterior chamber was filled with Healon 5 viscoelastic.  A 2.4 millimeter keratome was used to make a near-clear corneal incision at the 8:00 position.  A curvilinear capsulorrhexis was made with a cystotome and capsulorrhexis forceps.  Balanced salt solution was used to hydrodissect and hydrodelineate the nucleus.   Phacoemulsification was then used in stop and chop fashion to remove the lens nucleus and epinucleus.  The remaining cortex  was then removed using the irrigation and aspiration handpiece. Healon was then placed into the capsular bag to distend it for lens placement.  A lens was then injected into the capsular bag.  The remaining viscoelastic was aspirated.   Wounds were hydrated with balanced salt solution.  The anterior chamber was inflated to a physiologic pressure with balanced salt solution.   Intracameral vigamox 0.1 mL undiluted was injected into the eye and a drop placed onto the ocular surface.  No wound leaks were noted.  The patient was taken to the recovery room in stable condition without complications of anesthesia or surgery  Benay Pillow 07/01/2019, 11:20 AM

## 2019-07-01 NOTE — Anesthesia Postprocedure Evaluation (Signed)
Anesthesia Post Note  Patient: Stacy Allen  Procedure(s) Performed: CATARACT EXTRACTION PHACO AND INTRAOCULAR LENS PLACEMENT (IOC) RIGHT 2.38  00:24.4 (Right Eye)     Patient location during evaluation: PACU Anesthesia Type: MAC Level of consciousness: awake and alert Pain management: pain level controlled Vital Signs Assessment: post-procedure vital signs reviewed and stable Respiratory status: spontaneous breathing, nonlabored ventilation, respiratory function stable and patient connected to nasal cannula oxygen Cardiovascular status: stable and blood pressure returned to baseline Postop Assessment: no apparent nausea or vomiting Anesthetic complications: no    Adele Barthel Shenique Childers

## 2019-07-01 NOTE — Anesthesia Procedure Notes (Signed)
Procedure Name: MAC Performed by: Dorsey Authement, CRNA Pre-anesthesia Checklist: Patient identified, Emergency Drugs available, Suction available, Timeout performed and Patient being monitored Patient Re-evaluated:Patient Re-evaluated prior to induction Oxygen Delivery Method: Nasal cannula Placement Confirmation: positive ETCO2       

## 2019-07-01 NOTE — Anesthesia Preprocedure Evaluation (Signed)
Anesthesia Evaluation  Patient identified by MRN, date of birth, ID band Patient awake    History of Anesthesia Complications Negative for: history of anesthetic complications  Airway Mallampati: II  TM Distance: >3 FB Neck ROM: Full    Dental no notable dental hx.    Pulmonary sleep apnea , COPD, former smoker,    Pulmonary exam normal        Cardiovascular Exercise Tolerance: Poor hypertension, + CAD and + Cardiac Stents  Normal cardiovascular exam  Deconditioned, knee pain with activity   Neuro/Psych negative psych ROS   GI/Hepatic Neg liver ROS, PUD, GERD  Medicated,  Endo/Other  Hypothyroidism Morbid obesity  Renal/GU negative Renal ROS     Musculoskeletal   Abdominal   Peds  Hematology   Anesthesia Other Findings   Reproductive/Obstetrics                             Anesthesia Physical Anesthesia Plan  ASA: III  Anesthesia Plan: MAC   Post-op Pain Management:    Induction: Intravenous  PONV Risk Score and Plan: 2 and Midazolam and Treatment may vary due to age or medical condition  Airway Management Planned: Nasal Cannula and Natural Airway  Additional Equipment: None  Intra-op Plan:   Post-operative Plan:   Informed Consent: I have reviewed the patients History and Physical, chart, labs and discussed the procedure including the risks, benefits and alternatives for the proposed anesthesia with the patient or authorized representative who has indicated his/her understanding and acceptance.       Plan Discussed with: CRNA  Anesthesia Plan Comments:         Anesthesia Quick Evaluation

## 2019-07-02 ENCOUNTER — Encounter: Payer: Self-pay | Admitting: *Deleted

## 2019-10-16 ENCOUNTER — Other Ambulatory Visit: Payer: Self-pay | Admitting: Orthopedic Surgery

## 2019-10-16 DIAGNOSIS — M48062 Spinal stenosis, lumbar region with neurogenic claudication: Secondary | ICD-10-CM

## 2019-10-16 DIAGNOSIS — M4807 Spinal stenosis, lumbosacral region: Secondary | ICD-10-CM

## 2019-10-16 DIAGNOSIS — M545 Low back pain, unspecified: Secondary | ICD-10-CM

## 2019-10-16 DIAGNOSIS — G8929 Other chronic pain: Secondary | ICD-10-CM

## 2019-10-31 ENCOUNTER — Ambulatory Visit
Admission: RE | Admit: 2019-10-31 | Discharge: 2019-10-31 | Disposition: A | Discharge status: Home / Self Care | Payer: Medicare HMO | Source: Ambulatory Visit | Attending: Orthopedic Surgery | Admitting: Orthopedic Surgery

## 2019-10-31 ENCOUNTER — Other Ambulatory Visit: Payer: Self-pay

## 2019-10-31 DIAGNOSIS — M48062 Spinal stenosis, lumbar region with neurogenic claudication: Secondary | ICD-10-CM | POA: Diagnosis present

## 2019-10-31 DIAGNOSIS — M25551 Pain in right hip: Secondary | ICD-10-CM | POA: Insufficient documentation

## 2019-10-31 DIAGNOSIS — M545 Low back pain, unspecified: Secondary | ICD-10-CM

## 2019-10-31 DIAGNOSIS — M4807 Spinal stenosis, lumbosacral region: Secondary | ICD-10-CM

## 2019-10-31 DIAGNOSIS — G8929 Other chronic pain: Secondary | ICD-10-CM

## 2019-12-26 ENCOUNTER — Ambulatory Visit
Admission: EM | Admit: 2019-12-26 | Discharge: 2019-12-26 | Disposition: A | Discharge status: Home / Self Care | Payer: Medicare HMO | Attending: Family Medicine | Admitting: Family Medicine

## 2019-12-26 ENCOUNTER — Other Ambulatory Visit: Payer: Self-pay

## 2019-12-26 ENCOUNTER — Encounter: Payer: Self-pay | Admitting: Emergency Medicine

## 2019-12-26 DIAGNOSIS — J320 Chronic maxillary sinusitis: Secondary | ICD-10-CM

## 2019-12-26 DIAGNOSIS — R0981 Nasal congestion: Secondary | ICD-10-CM | POA: Insufficient documentation

## 2019-12-26 DIAGNOSIS — R42 Dizziness and giddiness: Secondary | ICD-10-CM | POA: Diagnosis present

## 2019-12-26 DIAGNOSIS — Z20822 Contact with and (suspected) exposure to covid-19: Secondary | ICD-10-CM | POA: Insufficient documentation

## 2019-12-26 LAB — SARS CORONAVIRUS 2 (TAT 6-24 HRS): SARS Coronavirus 2: NEGATIVE

## 2019-12-26 MED ORDER — AMOXICILLIN-POT CLAVULANATE 875-125 MG PO TABS: 1.0000 | ORAL_TABLET | Freq: Two times a day (BID) | ORAL | 0 refills | Status: AC

## 2019-12-26 NOTE — Discharge Instructions (Addendum)
Recommend start Augmentin 875mg  twice a day as directed. Continue to use OTC topical medication to help open up sinuses. Continue to push fluids to help loosen up mucus in sinuses. Follow-up with your PCP in 4 to 5 days if not improving.

## 2019-12-26 NOTE — ED Triage Notes (Signed)
Pt c/o sinus congestion, dizziness, burning in her sinuses. Started about 4 days ago. She states she has been using the Netti pot and getting a lot of bloody, greenish mucus. Denies fever, or increased shortness of breath. Pt states she has had her covid vaccines.

## 2019-12-26 NOTE — ED Provider Notes (Signed)
[facility]    CSN: [id number] Arrival date & time: 12/26/19  1001      History   Chief Complaint Chief Complaint  Patient presents with  . Sinus Problem    HPI Stacy Allen is a 79 y.o. female.   79 year old female presents with nasal congestion, sinus pressure and burning sensation in her sinuses for the past 4 to 5 days. Experiencing bloody thick discolored mucus. Ears feel more clogged and started getting dizzy today. Also some nausea. Denies any fever or vomiting. No known exposure to COVID 19. Has tried OTC topical Vicks nose spray with some relief. Has received the complete COVID 19 vaccine series. Other chronic health issues include HTN, CAD, hyperlipidemia, GERD, thyroid disorder, sleep apnea, chronic back pain, fibromyalgia, migraine headaches, environmental allergies and mood disorder. Also had sinus surgery in 2017. Current medications include Chlorthalidone, Metoprolol, Vasotec, aspirin, Synthroid, Citalopram, Gabapentin, Hydrocortisone (oral), Naproxen, Prilosec, Tylenol, Voltaren gel, Vit D, Zyrtec and Fluticasone daily and Klonopin, Vistaril, Antivert, Zanaflex, Nitroglycerin, and Albuterol prn.    The history is provided by the patient.    Past Medical History:  Diagnosis Date  . Adenomatous polyps   . Allergic state   . Anemia   . Anginal pain ([place])   . Anxiety   . Arthritis    osteoarthritis  . Broken rib    left  . Cancer ([place])    skin  . Chicken pox   . Complication of anesthesia    respiratory distress after rectocele surgery  . Coronary artery disease   . Depression   . Eczema   . Fibromyalgia   . GERD (gastroesophageal reflux disease)   . Headache    migraines  . Hemorrhoids   . History of hiatal hernia   . Hypercalcemia   . Hyperlipidemia   . Hypertension   . Hypothyroidism   . Lumbar stenosis   . Obesity   . Peptic ulcer disease   . Shortness of breath dyspnea   . Sleep apnea    No CPAP  . Vertigo   . Vitamin D  deficiency     Patient Active Problem List   Diagnosis Date Noted  . Lumbar spondylosis 06/01/2017  . Lumbar degenerative disc disease 06/01/2017  . History of lumbar fusion 06/01/2017  . 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 Chronic pain syndrome 06/01/2017  . 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 ABDOMINAL HYSTERECTOMY    . BACK SURGERY  2007   Dr. Mauri Pole, [facility], Spinal Fusion  . CARDIAC CATHETERIZATION N/A 04/03/2015   Procedure: Left Heart Cath and Coronary Angiography;  Surgeon: Teodoro Spray, MD;  Location: [facility];  Service: Cardiovascular;  Laterality: N/A;  . CATARACT EXTRACTION W/PHACO Left 04/22/2019   Procedure: CATARACT EXTRACTION PHACO AND INTRAOCULAR LENS PLACEMENT (IOC) LEFT  00:45.4  12.9%  6.03;  Surgeon: Eulogio Bear, MD;  Location: [place];  Service: Ophthalmology;  Laterality: Left;  Latex sleep apnea  . CATARACT EXTRACTION W/PHACO Right 07/01/2019   Procedure: CATARACT EXTRACTION PHACO AND INTRAOCULAR LENS PLACEMENT (IOC) RIGHT 2.38  00:24.4;  Surgeon: Eulogio Bear, MD;  Location: [place] [facility];  Service: Ophthalmology;  Laterality: Right;  Latex Sleep apnea  . CHOLECYSTECTOMY    . CORONARY ANGIOPLASTY  2015   Dr. Ubaldo Glassing, [facility]  . ESOPHAGOGASTRODUODENOSCOPY (EGD) WITH PROPOFOL N/A 12/21/2016   Procedure: ESOPHAGOGASTRODUODENOSCOPY (EGD) WITH PROPOFOL;  Surgeon:  Manya Silvas, MD;  Location: [facility];  Service: Endoscopy;  Laterality: N/A;  . FRACTURE SURGERY Left 1991   Fractures Femur, [place] GUIDED SINUS SURGERY Bilateral 09/28/2015   Procedure: IMAGE GUIDED SINUS SURGERY, SEPTOPLASTY, BILATERAL FRONTAL SINUSOTOMIES, BILATERAL MAXILLARY ANTROSTOMIES, BILATERAL TOTAL ETHMOIDECTOMY, BILATERAL SPHENOIDECTOMY, BILATERAL INFERIOR TURBINATE REDUCTION;  Surgeon: Margaretha Sheffield, MD;  Location: [facility];  Service: ENT;  Laterality: Bilateral;  .  RECTOCELE REPAIR      OB History   No obstetric history on file.      Home Medications    Prior to Admission medications   Medication Sig Start Date End Date Taking? Authorizing Provider  acetaminophen (TYLENOL) 500 MG tablet Take 500 mg by mouth every 6 (six) hours as needed.   Yes [provider]  albuterol (PROVENTIL HFA;VENTOLIN HFA) 108 (90 BASE) MCG/ACT inhaler Inhale 2 puffs into the lungs.   Yes [provider]  cetirizine (ZYRTEC) 10 MG tablet Take 1 tablet (10 mg total) by mouth daily. If needed at night for itching not relieved by Claritin in the morning. 02/14/17  Yes Frederich Cha, MD  chlorthalidone (HYGROTON) 25 MG tablet Take 25 mg by mouth daily.   Yes [provider]  citalopram (CELEXA) 40 MG tablet Take 40 mg by mouth daily.   Yes [provider]  clonazePAM (KLONOPIN) 1 MG tablet Take 1 mg by mouth 2 (two) times daily. As needed for anxiety   Yes [provider]  diclofenac sodium (VOLTAREN) 1 % GEL Apply 2 g topically 4 (four) times daily.   Yes [provider]  enalapril (VASOTEC) 20 MG tablet Take 40 mg by mouth daily.    Yes [provider]  ergocalciferol (VITAMIN D2) 50000 UNITS capsule Take 50,000 Units by mouth every 30 (thirty) days.   Yes [provider]  fluticasone (VERAMYST) 27.5 MCG/SPRAY nasal spray Place 2 sprays into the nose daily.   Yes [provider]  gabapentin (NEURONTIN) 300 MG capsule Take 600 mg by mouth 3 (three) times daily. Two tabs to total 600 mgm by mouth 3 times daily   Yes [provider]  hydrocortisone (CORTEF) 10 MG tablet Take by mouth. 11/21/19  Yes [provider]  hydrocortisone (CORTEF) 5 MG tablet Take by mouth. 11/21/19  Yes [provider]  hydrOXYzine (ATARAX/VISTARIL) 10 MG tablet Take 10 mg by mouth 3 (three) times daily as needed.   Yes [provider]  Iron, Ferrous Sulfate, 325 (65 Fe) MG TABS Take by mouth  2 (two) times daily.   Yes [provider]  levothyroxine (SYNTHROID, LEVOTHROID) 112 MCG tablet Take 137 mcg by mouth daily before breakfast.    Yes [provider]  meclizine (ANTIVERT) 25 MG tablet Take 25 mg by mouth 3 (three) times daily as needed for dizziness.   Yes [provider]  metoprolol tartrate (LOPRESSOR) 25 MG tablet Take 12.5 mg by mouth 2 (two) times daily.    Yes [provider]  naproxen sodium (ALEVE) 220 MG tablet Take 220 mg by mouth.   Yes [provider]  nitroGLYCERIN (NITROSTAT) 0.4 MG SL tablet Place 0.4 mg under the tongue every 5 (five) minutes as needed for chest pain.   Yes [provider]  omeprazole (PRILOSEC) 20 MG capsule Take 40 mg by mouth 2 (two) times daily before a meal.    Yes [provider]  Propylene Glycol (SYSTANE COMPLETE OP) Apply to eye daily.   Yes  [provider]  tiZANidine (ZANAFLEX) 4 MG tablet Take 4 mg by mouth every 6 (six) hours as needed for muscle spasms.   Yes [provider]  amoxicillin-clavulanate (AUGMENTIN) 875-125 MG tablet Take 1 tablet by mouth every 12 (twelve) hours for 7 days. 12/26/19 01/02/20  Katy Apo, NP  aspirin 81 MG chewable tablet Chew by mouth daily.    [provider]  amLODipine (NORVASC) 5 MG tablet Take 5 mg by mouth 2 (two) times daily.  02/17/19  [provider]  loratadine (CLARITIN) 10 MG tablet Take 1 tablet (10 mg total) by mouth daily. Take 1 tablet in the morning. As needed for itching. 02/14/17 02/17/19  Frederich Cha, MD  ranitidine (ZANTAC) 150 MG capsule Take 1 capsule (150 mg total) by mouth 2 (two) times daily. 02/14/17 02/17/19  Frederich Cha, MD    Family History Family History  Problem Relation Age of Onset  . Hypertension Mother   . Cancer Mother   . Diabetes Father   . Hypertension Father   . Heart failure Father   . Breast cancer Neg Hx     Social History Social History   Tobacco Use  .  Smoking status: Former Smoker    Types: Cigarettes    Quit date: 2000    Years since quitting: 21.5  . Smokeless tobacco: Never Used  Vaping Use  . Vaping Use: Never used  Substance Use Topics  . Alcohol use: No  . Drug use: No     Allergies   Zithromax [azithromycin], Adhesive [tape], Codeine, Lactose intolerance (gi), Morphine and related, Red dye, Shellfish-derived products, Latex, and Lodine [etodolac]   Review of Systems Review of Systems  Constitutional: Positive for appetite change and fatigue. Negative for activity change, chills and fever.  HENT: Positive for congestion, ear pain (feels more full), postnasal drip, rhinorrhea, sinus pressure and sinus pain. Negative for ear discharge, facial swelling, mouth sores, sneezing and trouble swallowing.   Eyes: Negative for pain, discharge, redness and itching.  Respiratory: Negative for cough and wheezing.   Gastrointestinal: Positive for nausea. Negative for vomiting.  Musculoskeletal: Positive for arthralgias and myalgias. Negative for neck pain and neck stiffness.  Skin: Negative for color change, rash and wound.  Allergic/Immunologic: Positive for environmental allergies and food allergies.  Neurological: Positive for dizziness, light-headedness and headaches. Negative for seizures, syncope, weakness and numbness.  Hematological: Negative for adenopathy.  Psychiatric/Behavioral: Positive for sleep disturbance.     Physical Exam Triage Vital Signs ED Triage Vitals  Enc Vitals Group     BP 12/26/19 1028 127/77     Pulse Rate 12/26/19 1028 98     Resp 12/26/19 1028 18     Temp 12/26/19 1028 98 F (36.7 C)     Temp Source 12/26/19 1028 Oral     SpO2 12/26/19 1028 98 %     Weight 12/26/19 1023 229 lb 0.9 oz (103.9 kg)     Height 12/26/19 1023 5\' 3"  (1.6 m)     Head Circumference --      Peak Flow --      Pain Score 12/26/19 1023 0     Pain Loc --      Pain Edu? --      Excl. in [place]? --    No data found.  Updated  Vital Signs BP 127/77 (BP Location: Left Arm)   Pulse 98   Temp 98 F (36.7 C) (Oral)   Resp 18   Ht 5'  3" (1.6 m)   Wt 229 lb 0.9 oz (103.9 kg)   SpO2 98%   BMI 40.58 kg/m   Visual Acuity Right Eye Distance:   Left Eye Distance:   Bilateral Distance:    Right Eye Near:   Left Eye Near:    Bilateral Near:     Physical Exam Vitals and nursing note reviewed.  Constitutional:      General: She is awake. She is not in acute distress.    Appearance: She is well-developed, well-groomed and overweight. She is ill-appearing.     Comments: She is sitting in the exam chair in no acute distress but appears ill.   HENT:     Head: Normocephalic and atraumatic.     Right Ear: Hearing, ear canal and external ear normal. A middle ear effusion is present. Tympanic membrane is bulging. Tympanic membrane is not injected or erythematous.     Left Ear: Hearing, ear canal and external ear normal. A middle ear effusion is present. Tympanic membrane is bulging. Tympanic membrane is not injected or erythematous.     Nose: Congestion present.     Right Turbinates: Swollen.     Left Turbinates: Swollen.     Right Sinus: No maxillary sinus tenderness or frontal sinus tenderness.     Left Sinus: Maxillary sinus tenderness present. No frontal sinus tenderness.     Mouth/Throat:     Lips: Pink.     Mouth: Mucous membranes are moist.     Pharynx: Uvula midline. Oropharyngeal exudate (yellow post nasal drainage present) and posterior oropharyngeal erythema present. No pharyngeal swelling or uvula swelling.  Cardiovascular:     Rate and Rhythm: Normal rate and regular rhythm.     Heart sounds: Normal heart sounds. No murmur heard.   Pulmonary:     Effort: Pulmonary effort is normal. No respiratory distress.     Breath sounds: Normal breath sounds and air entry. No decreased air movement. No decreased breath sounds, wheezing or rhonchi.  Musculoskeletal:     Cervical back: Normal range of motion and  neck supple. No rigidity.  Lymphadenopathy:     Cervical: No cervical adenopathy.  Skin:    General: Skin is warm and dry.     Capillary Refill: Capillary refill takes less than 2 seconds.     Findings: No rash.  Neurological:     General: No focal deficit present.     Mental Status: She is alert and oriented to person, place, and time.  Psychiatric:        Mood and Affect: Mood normal.        Behavior: Behavior normal. Behavior is cooperative.        Thought Content: Thought content normal.        Judgment: Judgment normal.      UC Treatments / Results  Labs (all labs ordered are listed, but only abnormal results are displayed) Labs Reviewed  SARS CORONAVIRUS 2 (TAT 6-24 HRS)    EKG   Radiology No results found.  Procedures Procedures (including critical care time)  Medications Ordered in UC Medications - No data to display  Initial Impression / Assessment and Plan / UC Course  I have reviewed the triage vital signs and the nursing notes.  Pertinent labs & imaging results that were available during my care of the patient were reviewed by me and considered in my medical decision making (see chart for details).    Reviewed with patient that she probably has a early  maxillary sinus infection. Also having some dizziness which may be a result of middle ear effusion. Since past medical history significant for sinus surgery and has multiple chronic health issues, will treat for possible bacterial infection. Recommend start Augmentin 875mg  twice a day as directed. Continue to use OTC topical Vicks Vapor Rub nose spray to help open up sinuses- avoid decongestants. Continue to push fluids to help loosen up mucus. Follow-up pending COVID 19 test result and with her PCP in 4 to 5 days if not improving.   Final Clinical Impressions(s) / UC Diagnoses   Final diagnoses:  Left maxillary sinusitis  Nasal sinus congestion  Dizziness     Discharge Instructions     Recommend  start Augmentin 875mg  twice a day as directed. Continue to use OTC topical medication to help open up sinuses. Continue to push fluids to help loosen up mucus in sinuses. Follow-up with your PCP in 4 to 5 days if not improving.     ED Prescriptions    Medication Sig Dispense Auth. Provider   amoxicillin-clavulanate (AUGMENTIN) 875-125 MG tablet Take 1 tablet by mouth every 12 (twelve) hours for 7 days. 14 tablet Wilson Dusenbery, Nicholes Stairs, NP     PDMP not reviewed this encounter.   Katy Apo, NP 12/27/19 2232

## 2020-02-10 ENCOUNTER — Emergency Department (HOSPITAL_COMMUNITY): Payer: Medicare HMO

## 2020-02-10 ENCOUNTER — Emergency Department (HOSPITAL_COMMUNITY)
Admission: EM | Admit: 2020-02-10 | Discharge: 2020-02-10 | Disposition: A | Discharge status: Home / Self Care | Payer: Medicare HMO | Attending: Emergency Medicine | Admitting: Emergency Medicine

## 2020-02-10 ENCOUNTER — Other Ambulatory Visit: Payer: Self-pay

## 2020-02-10 ENCOUNTER — Encounter (HOSPITAL_COMMUNITY): Payer: Self-pay

## 2020-02-10 DIAGNOSIS — R000 Tachycardia, unspecified: Secondary | ICD-10-CM | POA: Diagnosis not present

## 2020-02-10 DIAGNOSIS — Z79899 Other long term (current) drug therapy: Secondary | ICD-10-CM | POA: Insufficient documentation

## 2020-02-10 DIAGNOSIS — Z7982 Long term (current) use of aspirin: Secondary | ICD-10-CM | POA: Insufficient documentation

## 2020-02-10 DIAGNOSIS — Z91040 Latex allergy status: Secondary | ICD-10-CM | POA: Diagnosis not present

## 2020-02-10 DIAGNOSIS — E039 Hypothyroidism, unspecified: Secondary | ICD-10-CM | POA: Diagnosis not present

## 2020-02-10 DIAGNOSIS — Z85828 Personal history of other malignant neoplasm of skin: Secondary | ICD-10-CM | POA: Diagnosis not present

## 2020-02-10 DIAGNOSIS — I10 Essential (primary) hypertension: Secondary | ICD-10-CM | POA: Insufficient documentation

## 2020-02-10 DIAGNOSIS — Z79890 Hormone replacement therapy (postmenopausal): Secondary | ICD-10-CM | POA: Insufficient documentation

## 2020-02-10 DIAGNOSIS — Z87891 Personal history of nicotine dependence: Secondary | ICD-10-CM | POA: Diagnosis not present

## 2020-02-10 DIAGNOSIS — I2510 Atherosclerotic heart disease of native coronary artery without angina pectoris: Secondary | ICD-10-CM | POA: Insufficient documentation

## 2020-02-10 DIAGNOSIS — M545 Low back pain: Secondary | ICD-10-CM | POA: Diagnosis present

## 2020-02-10 DIAGNOSIS — Z951 Presence of aortocoronary bypass graft: Secondary | ICD-10-CM | POA: Diagnosis not present

## 2020-02-10 DIAGNOSIS — M5431 Sciatica, right side: Secondary | ICD-10-CM | POA: Insufficient documentation

## 2020-02-10 LAB — CBC WITH DIFFERENTIAL/PLATELET
Abs Immature Granulocytes: 0.02 10*3/uL (ref 0.00–0.07)
Basophils Absolute: 0.1 10*3/uL (ref 0.0–0.1)
Basophils Relative: 1 %
Eosinophils Absolute: 0.1 10*3/uL (ref 0.0–0.5)
Eosinophils Relative: 2 %
HCT: 36.1 % (ref 36.0–46.0)
Hemoglobin: 11 g/dL — ABNORMAL LOW (ref 12.0–15.0)
Immature Granulocytes: 0 %
Lymphocytes Relative: 18 %
Lymphs Abs: 1.2 10*3/uL (ref 0.7–4.0)
MCH: 28.8 pg (ref 26.0–34.0)
MCHC: 30.5 g/dL (ref 30.0–36.0)
MCV: 94.5 fL (ref 80.0–100.0)
Monocytes Absolute: 0.6 10*3/uL (ref 0.1–1.0)
Monocytes Relative: 8 %
Neutro Abs: 4.8 10*3/uL (ref 1.7–7.7)
Neutrophils Relative %: 71 %
Platelets: 378 10*3/uL (ref 150–400)
RBC: 3.82 MIL/uL — ABNORMAL LOW (ref 3.87–5.11)
RDW: 15.1 % (ref 11.5–15.5)
WBC: 6.8 10*3/uL (ref 4.0–10.5)
nRBC: 0 % (ref 0.0–0.2)

## 2020-02-10 LAB — COMPREHENSIVE METABOLIC PANEL
ALT: 16 U/L (ref 0–44)
AST: 21 U/L (ref 15–41)
Albumin: 3.3 g/dL — ABNORMAL LOW (ref 3.5–5.0)
Alkaline Phosphatase: 81 U/L (ref 38–126)
Anion gap: 10 (ref 5–15)
BUN: 12 mg/dL (ref 8–23)
CO2: 26 mmol/L (ref 22–32)
Calcium: 8.9 mg/dL (ref 8.9–10.3)
Chloride: 100 mmol/L (ref 98–111)
Creatinine, Ser: 0.75 mg/dL (ref 0.44–1.00)
GFR calc Af Amer: >60 mL/min (ref >60)
GFR calc non Af Amer: >60 mL/min (ref >60)
Glucose, Bld: 100 mg/dL — ABNORMAL HIGH (ref 70–99)
Potassium: 3.7 mmol/L (ref 3.5–5.1)
Sodium: 136 mmol/L (ref 135–145)
Total Bilirubin: 0.8 mg/dL (ref 0.3–1.2)
Total Protein: 6.8 g/dL (ref 6.5–8.1)

## 2020-02-10 MED ORDER — FENTANYL CITRATE (PF) 100 MCG/2ML IJ SOLN
25.0000 ug | Freq: Once | INTRAMUSCULAR | Status: AC
  Administered 2020-02-10: 25 ug via INTRAVENOUS
  Filled 2020-02-10: qty 2

## 2020-02-10 MED ORDER — DEXAMETHASONE SODIUM PHOSPHATE 10 MG/ML IJ SOLN
10.0000 mg | Freq: Once | INTRAMUSCULAR | Status: AC
  Administered 2020-02-10: 10 mg via INTRAVENOUS
  Filled 2020-02-10: qty 1

## 2020-02-10 MED ORDER — PREDNISONE 20 MG PO TABS: 40.0000 mg | ORAL_TABLET | Freq: Every day | ORAL | 0 refills | Status: DC

## 2020-02-10 MED ORDER — DICLOFENAC EPOLAMINE 1.3 % EX PTCH: 1.0000 | MEDICATED_PATCH | Freq: Two times a day (BID) | CUTANEOUS | 1 refills | Status: DC

## 2020-02-10 MED ORDER — HYDROCODONE-ACETAMINOPHEN 5-325 MG PO TABS: 1.0000 | ORAL_TABLET | Freq: Four times a day (QID) | ORAL | 0 refills | Status: DC | PRN

## 2020-02-10 NOTE — ED Notes (Signed)
Pt returned from xray

## 2020-02-10 NOTE — ED Provider Notes (Signed)
[facility] EMERGENCY DEPARTMENT Provider Note   CSN: [id number] Arrival date & time: 02/10/20  1113     History Chief Complaint  Patient presents with  . Sciatica    Stacy Allen is a 79 y.o. female.  HPI    Patient presents with concern of right low back pain radiating to right leg. She notes a history of prior back surgery, prior episodes of pain.  She notes that after an episode about 1 month ago following which she was advised to consider nursing home placement. She has been using Tylenol at home for pain relief without substantial benefit. No fever, no chills, no vomiting, no abdominal pain, no incontinence. Today she notes that pain became more severe than prior and with that and concern of general discomfort, she presents for evaluation.  Past Medical History:  Diagnosis Date  . Adenomatous polyps   . Allergic state   . Anemia   . Anginal pain ([place])   . Anxiety   . Arthritis    osteoarthritis  . Broken rib    left  . Cancer ([place])    skin  . Chicken pox   . Complication of anesthesia    respiratory distress after rectocele surgery  . Coronary artery disease   . Depression   . Eczema   . Fibromyalgia   . GERD (gastroesophageal reflux disease)   . Headache    migraines  . Hemorrhoids   . History of hiatal hernia   . Hypercalcemia   . Hyperlipidemia   . Hypertension   . Hypothyroidism   . Lumbar stenosis   . Obesity   . Peptic ulcer disease   . Shortness of breath dyspnea   . Sleep apnea    No CPAP  . Vertigo   . Vitamin D deficiency     Patient Active Problem List   Diagnosis Date Noted  . Lumbar spondylosis 06/01/2017  . Lumbar degenerative disc disease 06/01/2017  . History of lumbar fusion 06/01/2017  . 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 Chronic pain syndrome 06/01/2017  . 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 ABDOMINAL HYSTERECTOMY    . BACK SURGERY  2007    Dr. Mauri Pole, [facility], Spinal Fusion  . CARDIAC CATHETERIZATION N/A 04/03/2015   Procedure: Left Heart Cath and Coronary Angiography;  Surgeon: Teodoro Spray, MD;  Location: [facility];  Service: Cardiovascular;  Laterality: N/A;  . CATARACT EXTRACTION W/PHACO Left 04/22/2019   Procedure: CATARACT EXTRACTION PHACO AND INTRAOCULAR LENS PLACEMENT (IOC) LEFT  00:45.4  12.9%  6.03;  Surgeon: Eulogio Bear, MD;  Location: [facility];  Service: Ophthalmology;  Laterality: Left;  Latex sleep apnea  . CATARACT EXTRACTION W/PHACO Right 07/01/2019   Procedure: CATARACT EXTRACTION PHACO AND INTRAOCULAR LENS PLACEMENT (IOC) RIGHT 2.38  00:24.4;  Surgeon: Eulogio Bear, MD;  Location: [facility];  Service: Ophthalmology;  Laterality: Right;  Latex Sleep apnea  . CHOLECYSTECTOMY    . CORONARY ANGIOPLASTY  2015   Dr. Ubaldo Glassing, [facility] Lab  . ESOPHAGOGASTRODUODENOSCOPY (EGD) WITH PROPOFOL N/A 12/21/2016   Procedure: ESOPHAGOGASTRODUODENOSCOPY (EGD) WITH PROPOFOL;  Surgeon: Manya Silvas, MD;  Location: [facility] ENDOSCOPY;  Service: Endoscopy;  Laterality: N/A;  . FRACTURE SURGERY Left 1991   Fractures Femur, [place] GUIDED SINUS SURGERY Bilateral 09/28/2015   Procedure: IMAGE GUIDED SINUS SURGERY, SEPTOPLASTY, BILATERAL FRONTAL SINUSOTOMIES, BILATERAL MAXILLARY ANTROSTOMIES, BILATERAL TOTAL ETHMOIDECTOMY,  BILATERAL SPHENOIDECTOMY, BILATERAL INFERIOR TURBINATE REDUCTION;  Surgeon: Margaretha Sheffield, MD;  Location: [facility];  Service: ENT;  Laterality: Bilateral;  . RECTOCELE REPAIR       OB History   No obstetric history on file.     Family History  Problem Relation Age of Onset  . Hypertension Mother   . Cancer Mother   . Diabetes Father   . Hypertension Father   . Heart failure Father   . Breast cancer Neg Hx     Social History   Tobacco Use  . Smoking status: Former Smoker    Types: Cigarettes    Quit date: 2000    Years since quitting: 21.6  .  Smokeless tobacco: Never Used  Vaping Use  . Vaping Use: Never used  Substance Use Topics  . Alcohol use: No  . Drug use: No    Home Medications Prior to Admission medications   Medication Sig Start Date End Date Taking? Authorizing Provider  acetaminophen (TYLENOL) 500 MG tablet Take 500 mg by mouth every 6 (six) hours as needed.    [provider]  albuterol (PROVENTIL HFA;VENTOLIN HFA) 108 (90 BASE) MCG/ACT inhaler Inhale 2 puffs into the lungs.    [provider]  aspirin 81 MG chewable tablet Chew by mouth daily.    [provider]  cetirizine (ZYRTEC) 10 MG tablet Take 1 tablet (10 mg total) by mouth daily. If needed at night for itching not relieved by Claritin in the morning. 02/14/17   Frederich Cha, MD  chlorthalidone (HYGROTON) 25 MG tablet Take 25 mg by mouth daily.    [provider]  citalopram (CELEXA) 40 MG tablet Take 40 mg by mouth daily.    [provider]  clonazePAM (KLONOPIN) 1 MG tablet Take 1 mg by mouth 2 (two) times daily. As needed for anxiety    [provider]  diclofenac sodium (VOLTAREN) 1 % GEL Apply 2 g topically 4 (four) times daily.    [provider]  enalapril (VASOTEC) 20 MG tablet Take 40 mg by mouth daily.     [provider]  ergocalciferol (VITAMIN D2) 50000 UNITS capsule Take 50,000 Units by mouth every 30 (thirty) days.    [provider]  fluticasone (VERAMYST) 27.5 MCG/SPRAY nasal spray Place 2 sprays into the nose daily.    [provider]  gabapentin (NEURONTIN) 300 MG capsule Take 600 mg by mouth 3 (three) times daily. Two tabs to total 600 mgm by mouth 3 times daily    [provider]  hydrocortisone (CORTEF) 10 MG tablet Take by mouth. 11/21/19   [provider]  hydrocortisone (CORTEF) 5 MG tablet Take by mouth. 11/21/19   [provider]  hydrOXYzine (ATARAX/VISTARIL) 10 MG tablet Take 10 mg by mouth 3 (three) times daily as  needed.    [provider]  Iron, Ferrous Sulfate, 325 (65 Fe) MG TABS Take by mouth 2 (two) times daily.    [provider]  levothyroxine (SYNTHROID, LEVOTHROID) 112 MCG tablet Take 137 mcg by mouth daily before breakfast.     [provider]  meclizine (ANTIVERT) 25 MG tablet Take 25 mg by mouth 3 (three) times daily as needed for dizziness.    [provider]  metoprolol tartrate (LOPRESSOR) 25 MG tablet Take 12.5 mg by mouth 2 (two) times daily.     [provider]  naproxen sodium (ALEVE) 220 MG tablet Take 220 mg by mouth.    [provider]  nitroGLYCERIN (NITROSTAT) 0.4 MG SL tablet Place 0.4 mg under the tongue every 5 (five) minutes as needed for chest pain.    [provider]  omeprazole (PRILOSEC) 20 MG capsule Take 40 mg by mouth 2 (two) times daily before a meal.     [provider]  Propylene Glycol (SYSTANE COMPLETE OP) Apply to eye daily.    [provider]  tiZANidine (ZANAFLEX) 4 MG tablet Take 4 mg by mouth every 6 (six) hours as needed for muscle spasms.    [provider]  amLODipine (NORVASC) 5 MG tablet Take 5 mg by mouth 2 (two) times daily.  02/17/19  [provider]  loratadine (CLARITIN) 10 MG tablet Take 1 tablet (10 mg total) by mouth daily. Take 1 tablet in the morning. As needed for itching. 02/14/17 02/17/19  Frederich Cha, MD  ranitidine (ZANTAC) 150 MG capsule Take 1 capsule (150 mg total) by mouth 2 (two) times daily. 02/14/17 02/17/19  Frederich Cha, MD    Allergies    Zithromax [azithromycin], Adhesive [tape], Codeine, Lactose intolerance (gi), Morphine and related, Red dye, Shellfish-derived products, Latex, and Lodine [etodolac]  Review of Systems   Review of Systems  Constitutional:       Per HPI, otherwise negative  HENT:       Per HPI, otherwise negative  Respiratory:       Per HPI, otherwise negative  Cardiovascular:       Per HPI, otherwise negative    Gastrointestinal: Negative for vomiting.  Endocrine:       Negative aside from HPI  Genitourinary:       Neg aside from HPI   Musculoskeletal:       Per HPI, otherwise negative  Skin: Negative.   Neurological: Negative for syncope.    Physical Exam Updated Vital Signs BP (!) 162/108 (BP Location: Left Arm)   Pulse (!) 119   Temp 98.2 F (36.8 C) (Oral)   Resp 20   Ht 5\' 2"  (1.575 m)   Wt 102.1 kg   SpO2 95%   BMI 41.15 kg/m   Physical Exam Vitals and nursing note reviewed.  Constitutional:      Appearance: She is well-developed. She is obese. She is diaphoretic. She is not ill-appearing.  HENT:     Head: Normocephalic and atraumatic.  Eyes:     Conjunctiva/sclera: Conjunctivae normal.  Cardiovascular:     Rate and Rhythm: Regular rhythm. Tachycardia present.     Pulses: Normal pulses.  Pulmonary:     Effort: Pulmonary effort is normal. No respiratory distress.     Breath sounds: Normal breath sounds. No stridor.  Abdominal:     General: There is no distension.  Musculoskeletal:       Back:       Legs:  Skin:    General: Skin is warm.  Neurological:     Mental Status: She is alert and oriented to person, place, and time.     Cranial Nerves: No cranial nerve deficit.     ED Results / Procedures / Treatments   Labs (all labs ordered are listed, but only abnormal results are displayed) Labs Reviewed  COMPREHENSIVE METABOLIC PANEL  CBC WITH DIFFERENTIAL/PLATELET    EKG None  Radiology DG Lumbar Spine 2-3 Views  Result Date: 02/10/2020 CLINICAL DATA:  Low back pain with right-sided radiculopathy EXAM: LUMBAR SPINE - 2-3 VIEW COMPARISON:  04/07/2017, 10/31/2019 FINDINGS: Prior L2-L4 posterior and interbody fusion. Alignment unchanged. Lumbar levocurvature.  Chronic deformity of L3. No evidence of acute vertebral body fracture. Similar degree of advanced degenerative disc disease at the L1-2 levels. Asymmetric height loss at L4-5 where there is chronic  grade 1 anterolisthesis. Bones appear demineralized. IMPRESSION: 1. Stable appearance of L2-L4 posterior and interbody fusion. 2. Chronic compression deformity of the L3 vertebral body. 3. Chronic multilevel degenerative changes, similar to prior study. Electronically Signed   By: Davina Poke D.O.   On: 02/10/2020 12:40   MRI May/2021  IMPRESSION: 1. Possible small focal soft disc protrusion into the left neural foramen at L5-S1 which could affect the left L5 nerve. 2. Chronic moderate right foraminal stenosis at L4-5 which could affect the right L4 nerve. 3. Interbody and posterior fusion at L2-3 and L3-4 with no residual neural impingement.   Procedures Procedures (including critical care time)  Medications Ordered in ED Medications  fentaNYL (SUBLIMAZE) injection 25 mcg (25 mcg Intravenous Given 02/10/20 1212)  dexamethasone (DECADRON) injection 10 mg (10 mg Intravenous Given 02/10/20 1212)    ED Course  I have reviewed the triage vital signs and the nursing notes.  Pertinent labs & imaging results that were available during my care of the patient were reviewed by me and considered in my medical decision making (see chart for details).    After the initial evaluation, patient received medications.  On repeat evaluation she states that she feels better.  3:18 PM I had a lengthy conversation with the patient about today's findings, prior MRI, today's x-ray, today's labs. No leukocytosis, no fever, and with preserved distal neurovascular status, low suspicion for infection. Some suspicion for lumbar sacral radicular processes given her description of pain, absence of other alarming findings. Patient amenable to trying some medication, following up as an outpatient In addition, the patient will have home health services initiated from the ED.  Final Clinical Impression(s) / ED Diagnoses Final diagnoses:  Sciatica of right side    Rx / DC Orders ED Discharge Orders          [place]  Reprint     02/10/20 1506    Face-to-face encounter (required for Medicare/Medicaid patients)     Discontinue  Reprint    Comments: I Carmin Muskrat certify that this patient is under my care and that I, or a nurse practitioner or physician's assistant working with me, had a face-to-face encounter that meets the physician face-to-face encounter requirements with this patient on 02/10/2020. The encounter with the patient was in whole, or in part for the following medical condition(s) which is the primary reason for home health care (List medical condition): additional orders per PMD   02/10/20 1506    HYDROcodone-acetaminophen (NORCO/VICODIN) 5-325 MG tablet  Every 6 hours PRN     Discontinue  Reprint     02/10/20 1506    predniSONE (DELTASONE) 20 MG tablet  Daily with breakfast     Discontinue  Reprint     02/10/20 1506    diclofenac (FLECTOR) 1.3 % PTCH  2 times daily     Discontinue  Reprint     02/10/20 1506           Carmin Muskrat, MD 02/10/20 1519

## 2020-02-10 NOTE — Discharge Instructions (Signed)
As discussed, it is important to take all medication as directed, and follow-up with our physicians for appropriate ongoing care.  If you do develop a reaction to any of the medications, please stop using that one immediately.  A referral has been made on your behalf for home health services. You can expect to be contacted in the coming days. Return here for concerning changes in your condition.

## 2020-02-10 NOTE — ED Notes (Signed)
Pt transported to xray 

## 2020-02-10 NOTE — ED Triage Notes (Signed)
Pt brought to ED from home via EMS with c/o right sided sciatic pain, cough, and nausea. Pt states she sits primarily in recliner due to recent difficulties with ambulating. 200 mcg Fentanyl given en route by EMS. Pain rated 10/10 in ED. Currently A&O x4, pulse 119, BP 162/108. EMS reports pt took daily BP meds prior to transport. Hx of sciatica, COPD, HTN.  EMS v/s: 99% RA 177/111 98 HR Lungs clear bilaterally

## 2020-03-19 ENCOUNTER — Ambulatory Visit: Payer: Medicare HMO | Admitting: Family Medicine

## 2020-05-05 ENCOUNTER — Ambulatory Visit
Admission: RE | Admit: 2020-05-05 | Discharge: 2020-05-05 | Disposition: A | Discharge status: Home / Self Care | Payer: Medicare HMO | Source: Home / Self Care | Attending: Family Medicine | Admitting: Family Medicine

## 2020-05-05 ENCOUNTER — Other Ambulatory Visit: Payer: Self-pay | Admitting: Family Medicine

## 2020-05-05 ENCOUNTER — Ambulatory Visit
Admission: RE | Admit: 2020-05-05 | Discharge: 2020-05-05 | Disposition: A | Discharge status: Home / Self Care | Payer: Medicare HMO | Source: Ambulatory Visit | Attending: Family Medicine | Admitting: Family Medicine

## 2020-05-05 ENCOUNTER — Other Ambulatory Visit: Payer: Self-pay

## 2020-05-05 DIAGNOSIS — M25551 Pain in right hip: Secondary | ICD-10-CM | POA: Insufficient documentation

## 2020-05-05 DIAGNOSIS — S72001A Fracture of unspecified part of neck of right femur, initial encounter for closed fracture: Secondary | ICD-10-CM | POA: Diagnosis not present

## 2020-05-06 ENCOUNTER — Other Ambulatory Visit: Payer: Self-pay

## 2020-05-06 ENCOUNTER — Encounter: Payer: Self-pay | Admitting: Emergency Medicine

## 2020-05-06 ENCOUNTER — Inpatient Hospital Stay
Admission: EM | Admit: 2020-05-06 | Discharge: 2020-05-14 | DRG: 522 | Disposition: A | Discharge status: Skilled Nursing Facility | Payer: Medicare HMO | Source: Ambulatory Visit | Attending: Internal Medicine | Admitting: Internal Medicine

## 2020-05-06 DIAGNOSIS — Z8249 Family history of ischemic heart disease and other diseases of the circulatory system: Secondary | ICD-10-CM

## 2020-05-06 DIAGNOSIS — F32A Depression, unspecified: Secondary | ICD-10-CM | POA: Diagnosis present

## 2020-05-06 DIAGNOSIS — D638 Anemia in other chronic diseases classified elsewhere: Secondary | ICD-10-CM | POA: Diagnosis present

## 2020-05-06 DIAGNOSIS — E871 Hypo-osmolality and hyponatremia: Secondary | ICD-10-CM | POA: Diagnosis not present

## 2020-05-06 DIAGNOSIS — Z881 Allergy status to other antibiotic agents status: Secondary | ICD-10-CM

## 2020-05-06 DIAGNOSIS — Z880 Allergy status to penicillin: Secondary | ICD-10-CM

## 2020-05-06 DIAGNOSIS — M797 Fibromyalgia: Secondary | ICD-10-CM | POA: Diagnosis present

## 2020-05-06 DIAGNOSIS — Z6841 Body Mass Index (BMI) 40.0 and over, adult: Secondary | ICD-10-CM

## 2020-05-06 DIAGNOSIS — Z955 Presence of coronary angioplasty implant and graft: Secondary | ICD-10-CM

## 2020-05-06 DIAGNOSIS — M25511 Pain in right shoulder: Secondary | ICD-10-CM | POA: Diagnosis present

## 2020-05-06 DIAGNOSIS — E039 Hypothyroidism, unspecified: Secondary | ICD-10-CM | POA: Diagnosis present

## 2020-05-06 DIAGNOSIS — Z87891 Personal history of nicotine dependence: Secondary | ICD-10-CM

## 2020-05-06 DIAGNOSIS — I2510 Atherosclerotic heart disease of native coronary artery without angina pectoris: Secondary | ICD-10-CM | POA: Diagnosis present

## 2020-05-06 DIAGNOSIS — I10 Essential (primary) hypertension: Secondary | ICD-10-CM | POA: Diagnosis present

## 2020-05-06 DIAGNOSIS — Z791 Long term (current) use of non-steroidal anti-inflammatories (NSAID): Secondary | ICD-10-CM

## 2020-05-06 DIAGNOSIS — Z20822 Contact with and (suspected) exposure to covid-19: Secondary | ICD-10-CM | POA: Diagnosis present

## 2020-05-06 DIAGNOSIS — Z833 Family history of diabetes mellitus: Secondary | ICD-10-CM

## 2020-05-06 DIAGNOSIS — Z888 Allergy status to other drugs, medicaments and biological substances status: Secondary | ICD-10-CM | POA: Diagnosis not present

## 2020-05-06 DIAGNOSIS — W19XXXA Unspecified fall, initial encounter: Secondary | ICD-10-CM | POA: Diagnosis present

## 2020-05-06 DIAGNOSIS — Z885 Allergy status to narcotic agent status: Secondary | ICD-10-CM | POA: Diagnosis not present

## 2020-05-06 DIAGNOSIS — G4733 Obstructive sleep apnea (adult) (pediatric): Secondary | ICD-10-CM | POA: Diagnosis present

## 2020-05-06 DIAGNOSIS — D72829 Elevated white blood cell count, unspecified: Secondary | ICD-10-CM | POA: Diagnosis not present

## 2020-05-06 DIAGNOSIS — S72001K Fracture of unspecified part of neck of right femur, subsequent encounter for closed fracture with nonunion: Secondary | ICD-10-CM

## 2020-05-06 DIAGNOSIS — K219 Gastro-esophageal reflux disease without esophagitis: Secondary | ICD-10-CM | POA: Diagnosis present

## 2020-05-06 DIAGNOSIS — E785 Hyperlipidemia, unspecified: Secondary | ICD-10-CM | POA: Diagnosis present

## 2020-05-06 DIAGNOSIS — Z91040 Latex allergy status: Secondary | ICD-10-CM | POA: Diagnosis not present

## 2020-05-06 DIAGNOSIS — Z85828 Personal history of other malignant neoplasm of skin: Secondary | ICD-10-CM

## 2020-05-06 DIAGNOSIS — Z79890 Hormone replacement therapy (postmenopausal): Secondary | ICD-10-CM

## 2020-05-06 DIAGNOSIS — D62 Acute posthemorrhagic anemia: Secondary | ICD-10-CM | POA: Diagnosis not present

## 2020-05-06 DIAGNOSIS — Z79899 Other long term (current) drug therapy: Secondary | ICD-10-CM

## 2020-05-06 DIAGNOSIS — F419 Anxiety disorder, unspecified: Secondary | ICD-10-CM | POA: Diagnosis present

## 2020-05-06 DIAGNOSIS — G894 Chronic pain syndrome: Secondary | ICD-10-CM | POA: Diagnosis present

## 2020-05-06 DIAGNOSIS — S72001A Fracture of unspecified part of neck of right femur, initial encounter for closed fracture: Secondary | ICD-10-CM | POA: Diagnosis present

## 2020-05-06 DIAGNOSIS — Z7982 Long term (current) use of aspirin: Secondary | ICD-10-CM

## 2020-05-06 LAB — CBC
HCT: 37.7 % (ref 36.0–46.0)
Hemoglobin: 12 g/dL (ref 12.0–15.0)
MCH: 29.6 pg (ref 26.0–34.0)
MCHC: 31.8 g/dL (ref 30.0–36.0)
MCV: 92.9 fL (ref 80.0–100.0)
Platelets: 333 10*3/uL (ref 150–400)
RBC: 4.06 MIL/uL (ref 3.87–5.11)
RDW: 15.5 % (ref 11.5–15.5)
WBC: 7.2 10*3/uL (ref 4.0–10.5)
nRBC: 0 % (ref 0.0–0.2)

## 2020-05-06 LAB — BASIC METABOLIC PANEL
Anion gap: 10 (ref 5–15)
BUN: 15 mg/dL (ref 8–23)
CO2: 27 mmol/L (ref 22–32)
Calcium: 8.6 mg/dL — ABNORMAL LOW (ref 8.9–10.3)
Chloride: 97 mmol/L — ABNORMAL LOW (ref 98–111)
Creatinine, Ser: 0.62 mg/dL (ref 0.44–1.00)
GFR, Estimated: >60 mL/min (ref >60)
Glucose, Bld: 141 mg/dL — ABNORMAL HIGH (ref 70–99)
Potassium: 4 mmol/L (ref 3.5–5.1)
Sodium: 134 mmol/L — ABNORMAL LOW (ref 135–145)

## 2020-05-06 LAB — SAMPLE TO BLOOD BANK

## 2020-05-06 LAB — PROTIME-INR
INR: 1 (ref 0.8–1.2)
Prothrombin Time: 12.5 seconds (ref 11.4–15.2)

## 2020-05-06 LAB — RESPIRATORY PANEL BY RT PCR (FLU A&B, COVID)
Influenza A by PCR: NEGATIVE
Influenza B by PCR: NEGATIVE
SARS Coronavirus 2 by RT PCR: NEGATIVE

## 2020-05-06 MED ORDER — CLONAZEPAM 0.5 MG PO TABS: 1.0000 mg | ORAL_TABLET | Freq: Two times a day (BID) | ORAL | Status: DC

## 2020-05-06 MED ORDER — VITAMIN D (ERGOCALCIFEROL) 1.25 MG (50000 UNIT) PO CAPS: 50000.0000 [IU] | ORAL_CAPSULE | ORAL | Status: DC

## 2020-05-06 MED ORDER — MECLIZINE HCL 25 MG PO TABS
25.0000 mg | ORAL_TABLET | Freq: Three times a day (TID) | ORAL | Status: DC | PRN
  Filled 2020-05-06: qty 1

## 2020-05-06 MED ORDER — LEVOTHYROXINE SODIUM 137 MCG PO TABS
137.0000 ug | ORAL_TABLET | Freq: Every day | ORAL | Status: DC
  Administered 2020-05-08 – 2020-05-14 (×7): 137 ug via ORAL
  Filled 2020-05-06 (×9): qty 1

## 2020-05-06 MED ORDER — GABAPENTIN 300 MG PO CAPS
600.0000 mg | ORAL_CAPSULE | Freq: Three times a day (TID) | ORAL | Status: DC
  Administered 2020-05-06 – 2020-05-14 (×20): 600 mg via ORAL
  Filled 2020-05-06 (×9): qty 2
  Filled 2020-05-06: qty 6
  Filled 2020-05-06 (×10): qty 2

## 2020-05-06 MED ORDER — CEFAZOLIN SODIUM-DEXTROSE 2-4 GM/100ML-% IV SOLN
2.0000 g | INTRAVENOUS | Status: AC
  Administered 2020-05-07: 3 g via INTRAVENOUS
  Filled 2020-05-06: qty 100

## 2020-05-06 MED ORDER — NITROGLYCERIN 0.4 MG SL SUBL: 0.4000 mg | SUBLINGUAL_TABLET | SUBLINGUAL | Status: DC | PRN

## 2020-05-06 MED ORDER — BISACODYL 10 MG RE SUPP
10.0000 mg | Freq: Every day | RECTAL | Status: DC | PRN
  Filled 2020-05-06: qty 1

## 2020-05-06 MED ORDER — METOPROLOL TARTRATE 25 MG PO TABS
12.5000 mg | ORAL_TABLET | Freq: Two times a day (BID) | ORAL | Status: DC
  Administered 2020-05-06 – 2020-05-14 (×15): 12.5 mg via ORAL
  Filled 2020-05-06 (×16): qty 1

## 2020-05-06 MED ORDER — ACETAMINOPHEN 500 MG PO TABS: 500.0000 mg | ORAL_TABLET | Freq: Four times a day (QID) | ORAL | Status: DC | PRN

## 2020-05-06 MED ORDER — MIRTAZAPINE 15 MG PO TABS
15.0000 mg | ORAL_TABLET | Freq: Every day | ORAL | Status: DC
  Administered 2020-05-06 – 2020-05-13 (×8): 15 mg via ORAL
  Filled 2020-05-06 (×9): qty 1

## 2020-05-06 MED ORDER — ENALAPRIL MALEATE 20 MG PO TABS
40.0000 mg | ORAL_TABLET | Freq: Every day | ORAL | Status: DC
  Administered 2020-05-08 – 2020-05-09 (×2): 40 mg via ORAL
  Filled 2020-05-06 (×3): qty 2

## 2020-05-06 MED ORDER — FERROUS SULFATE 325 (65 FE) MG PO TABS
325.0000 mg | ORAL_TABLET | Freq: Two times a day (BID) | ORAL | Status: DC
  Administered 2020-05-06 – 2020-05-14 (×15): 325 mg via ORAL
  Filled 2020-05-06 (×16): qty 1

## 2020-05-06 MED ORDER — HYDROXYZINE HCL 10 MG PO TABS
10.0000 mg | ORAL_TABLET | Freq: Three times a day (TID) | ORAL | Status: DC | PRN
  Filled 2020-05-06: qty 1

## 2020-05-06 MED ORDER — POLYVINYL ALCOHOL 1.4 % OP SOLN
Freq: Every day | OPHTHALMIC | Status: DC
  Filled 2020-05-06 (×2): qty 15

## 2020-05-06 MED ORDER — HYDROCODONE-ACETAMINOPHEN 5-325 MG PO TABS: 1.0000 | ORAL_TABLET | Freq: Four times a day (QID) | ORAL | Status: DC | PRN

## 2020-05-06 MED ORDER — CITALOPRAM HYDROBROMIDE 20 MG PO TABS
40.0000 mg | ORAL_TABLET | Freq: Every day | ORAL | Status: DC
  Administered 2020-05-08 – 2020-05-14 (×7): 40 mg via ORAL
  Filled 2020-05-06 (×8): qty 2

## 2020-05-06 MED ORDER — CHLORTHALIDONE 25 MG PO TABS: 25.0000 mg | ORAL_TABLET | Freq: Every day | ORAL | Status: DC

## 2020-05-06 MED ORDER — ALBUTEROL SULFATE HFA 108 (90 BASE) MCG/ACT IN AERS
2.0000 | INHALATION_SPRAY | Freq: Four times a day (QID) | RESPIRATORY_TRACT | Status: DC | PRN
  Administered 2020-05-09 – 2020-05-12 (×5): 2 via RESPIRATORY_TRACT
  Filled 2020-05-06 (×2): qty 6.7

## 2020-05-06 MED ORDER — MORPHINE SULFATE (PF) 2 MG/ML IV SOLN: 0.5000 mg | INTRAVENOUS | Status: DC | PRN

## 2020-05-06 MED ORDER — TIZANIDINE HCL 4 MG PO TABS
4.0000 mg | ORAL_TABLET | Freq: Four times a day (QID) | ORAL | Status: DC | PRN
  Filled 2020-05-06: qty 1

## 2020-05-06 MED ORDER — PANTOPRAZOLE SODIUM 40 MG PO TBEC
40.0000 mg | DELAYED_RELEASE_TABLET | Freq: Every day | ORAL | Status: DC
  Administered 2020-05-08 – 2020-05-14 (×7): 40 mg via ORAL
  Filled 2020-05-06 (×7): qty 1

## 2020-05-06 MED ORDER — FLUTICASONE PROPIONATE 50 MCG/ACT NA SUSP
2.0000 | Freq: Every day | NASAL | Status: DC
  Administered 2020-05-09 – 2020-05-14 (×5): 2 via NASAL
  Filled 2020-05-06: qty 16

## 2020-05-06 MED ORDER — LORATADINE 10 MG PO TABS
10.0000 mg | ORAL_TABLET | Freq: Every day | ORAL | Status: DC
  Administered 2020-05-08 – 2020-05-14 (×7): 10 mg via ORAL
  Filled 2020-05-06 (×7): qty 1

## 2020-05-06 MED ORDER — CLONAZEPAM 1 MG PO TABS
1.0000 mg | ORAL_TABLET | Freq: Two times a day (BID) | ORAL | Status: DC | PRN
  Administered 2020-05-06 – 2020-05-08 (×3): 1 mg via ORAL
  Filled 2020-05-06: qty 2
  Filled 2020-05-06 (×2): qty 1

## 2020-05-06 NOTE — ED Notes (Signed)
Dining contacted for tray

## 2020-05-06 NOTE — ED Triage Notes (Signed)
First Nurse Note:  Arrives from [place]-- sent to ED to be admitted for right hip fracture.  Dr. Nicola Police will admit patient.  Plan is to have surgery tomorrow.  Patient is AAOx3.  Skin warm and dry. NAD

## 2020-05-06 NOTE — TOC Initial Note (Signed)
Transition of Care [facility]) - Initial/Assessment Note    Patient Details  Name: Stacy Allen MRN: [id number] Date of Birth: 1940-12-18  [facility]) CM/SW Contact:    Ova Freshwater Phone Number: [phone] 05/06/2020, 4:07 PM  Clinical Narrative:                  CSW called and left voicemail for patient, [phone], requested a call back.        Patient Goals and CMS Choice        Expected Discharge Plan and Services                                                Prior Living Arrangements/Services                       Activities of Daily Living      Permission Sought/Granted                  Emotional Assessment              Admission diagnosis:  Closed right hip fracture ([place]) [S72.001A] Patient Active Problem List   Diagnosis Date Noted  . Closed right hip fracture ([place]) 05/06/2020  . Essential hypertension 05/06/2020  . Hypothyroidism 05/06/2020  . Lumbar spondylosis 06/01/2017  . Lumbar degenerative disc disease 06/01/2017  . History of lumbar fusion 06/01/2017  . 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 Chronic pain syndrome 06/01/2017  . SI joint arthritis 06/01/2017   PCP:  [place]:   [id number] [place], [address] [place] [address] Phone: [phone] Fax: [phone]     Social Determinants of Health (SDOH) Interventions    Readmission Risk Interventions No flowsheet data found.
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
## 2020-05-06 NOTE — ED Notes (Signed)
Pt alert and oriented, denies pain to right hip at this time, bed locked in lowest position and call bell within reach
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
## 2020-05-06 NOTE — Plan of Care (Signed)
  Problem: Education: Goal: Knowledge of [place] [facility] Education information/materials will improve Outcome: Progressing Goal: Emotional status will improve Outcome: Progressing Goal: Mental status will improve Outcome: Progressing Goal: Verbalization of understanding the information provided will improve Outcome: Progressing   Problem: Activity: Goal: Interest or engagement in activities will improve Outcome: Progressing Goal: Sleeping patterns will improve Outcome: Progressing   Problem: Coping: Goal: Ability to verbalize frustrations and anger appropriately will improve Outcome: Progressing Goal: Ability to demonstrate self-control will improve Outcome: Progressing   Problem: Health Behavior/Discharge Planning: Goal: Identification of resources available to assist in meeting health care needs will improve Outcome: Progressing Goal: Compliance with treatment plan for underlying cause of condition will improve Outcome: Progressing   Problem: Safety: Goal: Periods of time without injury will increase Outcome: Progressing   Problem: Education: Goal: Verbalization of understanding the information provided (i.e., activity precautions, restrictions, etc) will improve Outcome: Progressing Goal: Individualized Educational Video(s) Outcome: Progressing   Problem: Activity: Goal: Ability to ambulate and perform ADLs will improve Outcome: Progressing   Problem: Self-Concept: Goal: Ability to maintain and perform role responsibilities to the fullest extent possible will improve Outcome: Progressing   Problem: Pain Management: Goal: Pain level will decrease Outcome: Progressing

## 2020-05-06 NOTE — ED Notes (Signed)
Pharmacy contacted to verify medications.

## 2020-05-06 NOTE — Consult Note (Signed)
ORTHOPAEDIC CONSULTATION  REQUESTING PHYSICIAN: Collier Bullock, MD  Chief Complaint: Right hip pain  HPI: Stacy Allen is a 79 y.o. female who [facility] earlier today complaining of right hip pain.  Patient has had pain for several months and has had several falls over that span of time.  Approximately 10 days ago was her last fall and she had increasing pain after this injury.  Patient was diagnosed with a right femoral neck hip fracture by x-ray in the urgent care.  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  Patient was sent to the [facility] emergency department for admission and preop clearance.  Patient is scheduled for right hip hemiarthroplasty tomorrow.    Past Medical History:  Diagnosis Date  . Adenomatous polyps   . Allergic state   . Anemia   . Anginal pain ([place])   . Anxiety   . Arthritis    osteoarthritis  . Broken rib    left  . Cancer ([place])    skin  . Chicken pox   . Complication of anesthesia    respiratory distress after rectocele surgery  . Coronary artery disease   . Depression   . Eczema   . Fibromyalgia   . GERD (gastroesophageal reflux disease)   . Headache    migraines  . Hemorrhoids   . History of hiatal hernia   . Hypercalcemia   . Hyperlipidemia   . Hypertension   . Hypothyroidism   . Lumbar stenosis   . Obesity   . Peptic ulcer disease   . Shortness of breath dyspnea   . Sleep apnea    No CPAP  . Vertigo   . Vitamin D deficiency    Past Surgical History:  Procedure Laterality Date  . ABDOMINAL HYSTERECTOMY    . BACK SURGERY  2007   Dr. Mauri Pole, [facility], Spinal Fusion  . CARDIAC CATHETERIZATION N/A 04/03/2015   Procedure: Left Heart Cath and Coronary Angiography;  Surgeon: Teodoro Spray, MD;  Location: [place] CV LAB;  Service: Cardiovascular;  Laterality: N/A;  . CATARACT EXTRACTION W/PHACO Left 04/22/2019   Procedure: CATARACT EXTRACTION PHACO AND INTRAOCULAR  LENS PLACEMENT (IOC) LEFT  00:45.4  12.9%  6.03;  Surgeon: Eulogio Bear, MD;  Location: [place];  Service: Ophthalmology;  Laterality: Left;  Latex sleep apnea  . CATARACT EXTRACTION W/PHACO Right 07/01/2019   Procedure: CATARACT EXTRACTION PHACO AND INTRAOCULAR LENS PLACEMENT (IOC) RIGHT 2.38  00:24.4;  Surgeon: Eulogio Bear, MD;  Location: [place];  Service: Ophthalmology;  Laterality: Right;  Latex Sleep apnea  . CHOLECYSTECTOMY    . CORONARY ANGIOPLASTY  2015   Dr. Ubaldo Glassing, [facility] Cath Lab  . ESOPHAGOGASTRODUODENOSCOPY (EGD) WITH PROPOFOL N/A 12/21/2016   Procedure: ESOPHAGOGASTRODUODENOSCOPY (EGD) WITH PROPOFOL;  Surgeon: Manya Silvas, MD;  Location: [facility] ENDOSCOPY;  Service: Endoscopy;  Laterality: N/A;  . FRACTURE SURGERY Left 1991   Fractures Femur, [place] GUIDED SINUS SURGERY Bilateral 09/28/2015   Procedure: IMAGE GUIDED SINUS SURGERY, SEPTOPLASTY, BILATERAL FRONTAL SINUSOTOMIES, BILATERAL MAXILLARY ANTROSTOMIES, BILATERAL TOTAL ETHMOIDECTOMY, BILATERAL SPHENOIDECTOMY, BILATERAL INFERIOR TURBINATE REDUCTION;  Surgeon: Margaretha Sheffield, MD;  Location: [facility] ORS;  Service: ENT;  Laterality: Bilateral;  . RECTOCELE REPAIR     Social History   Socioeconomic History  . Marital status: Widowed    Spouse name: Not on file  . Number of children: Not on file  . Years of education: Not on  file  . Highest education level: Not on file  Occupational History  . Not on file  Tobacco Use  . Smoking status: Former Smoker    Types: Cigarettes    Quit date: 2000    Years since quitting: 21.8  . Smokeless tobacco: Never Used  Vaping Use  . Vaping Use: Never used  Substance and Sexual Activity  . Alcohol use: No  . Drug use: No  . Sexual activity: Not on file  Other Topics Concern  . Not on file  Social History Narrative  . Not on file   Social Determinants of Health   Financial Resource Strain:   . Difficulty of Paying Living Expenses:  Not on file  Food Insecurity:   . Worried About Charity fundraiser in the Last Year: Not on file  . Ran Out of Food in the Last Year: Not on file  Transportation Needs:   . Lack of Transportation (Medical): Not on file  . Lack of Transportation (Non-Medical): Not on file  Physical Activity:   . Days of Exercise per Week: Not on file  . Minutes of Exercise per Session: Not on file  Stress:   . Feeling of Stress : Not on file  Social Connections:   . Frequency of Communication with Friends and Family: Not on file  . Frequency of Social Gatherings with Friends and Family: Not on file  . Attends Religious Services: Not on file  . Active Member of Clubs or Organizations: Not on file  . Attends Archivist Meetings: Not on file  . Marital Status: Not on file   Family History  Problem Relation Age of Onset  . Hypertension Mother   . Cancer Mother   . Diabetes Father   . Hypertension Father   . Heart failure Father   . Breast cancer Neg Hx    Allergies  Allergen Reactions  . Zithromax [Azithromycin] Diarrhea    "fainting"  . Adhesive [Tape]     blisters  . Codeine Nausea And Vomiting  . Lactose Intolerance (Gi) Other (See Comments)    "stomach pain"  . Morphine And Related Hives  . Penicillins Other (See Comments)  . Red Dye Itching    "red food dye"  . Shellfish-Derived Products Itching and Swelling  . Latex Rash    With extended contact  . Lodine [Etodolac] Rash  . Pravastatin Rash   Prior to Admission medications   Medication Sig Start Date End Date Taking? Authorizing Provider  amLODipine (NORVASC) 10 MG tablet Take 10 mg by mouth daily. 05/05/20  Yes [provider]  citalopram (CELEXA) 40 MG tablet Take 40 mg by mouth daily.   Yes [provider]  clonazePAM (KLONOPIN) 1 MG tablet Take 1 mg by mouth 2 (two) times daily. As needed for anxiety   Yes [provider]  enalapril (VASOTEC) 20 MG tablet Take 20 mg by mouth daily.    Yes  [provider]  ergocalciferol (VITAMIN D2) 50000 UNITS capsule Take 50,000 Units by mouth every 30 (thirty) days.   Yes [provider]  gabapentin (NEURONTIN) 600 MG tablet Take 600 mg by mouth 3 (three) times daily. 04/24/20  Yes [provider]  Iron, Ferrous Sulfate, 325 (65 Fe) MG TABS Take by mouth 2 (two) times daily.   Yes [provider]  levothyroxine (SYNTHROID) 137 MCG tablet Take 137 mcg by mouth daily. 04/21/20  Yes [provider]  metoprolol tartrate (LOPRESSOR) 25 MG tablet  Take 37.5 mg by mouth 2 (two) times daily.    Yes [provider]  mirtazapine (REMERON) 15 MG tablet Take 15 mg by mouth at bedtime. 04/07/20  Yes [provider]  omeprazole (PRILOSEC) 20 MG capsule Take 40 mg by mouth daily.    Yes [provider]  Polyethyl Glycol-Propyl Glycol (SYSTANE) 0.4-0.3 % GEL ophthalmic gel Place 1 application into both eyes 2 (two) times daily as needed (dry eyes).   Yes [provider]  acetaminophen (TYLENOL) 500 MG tablet Take 500 mg by mouth every 6 (six) hours as needed.    [provider]  albuterol (PROVENTIL HFA;VENTOLIN HFA) 108 (90 BASE) MCG/ACT inhaler Inhale 2 puffs into the lungs.    [provider]  aspirin 81 MG chewable tablet Chew by mouth daily. Patient not taking: Reported on 05/06/2020    [provider]  chlorthalidone (HYGROTON) 25 MG tablet Take 25 mg by mouth daily. Patient not taking: Reported on 05/06/2020    [provider]  diclofenac (FLECTOR) 1.3 % PTCH Place 1 patch onto the skin 2 (two) times daily. 02/10/20   Carmin Muskrat, MD  diclofenac sodium (VOLTAREN) 1 % GEL Apply 2 g topically 4 (four) times daily.    [provider]  EPINEPHrine 0.3 mg/0.3 mL IJ SOAJ injection Inject 0.3 mg into the muscle once. 04/20/20   [provider]  meloxicam (MOBIC) 7.5 MG tablet Take 7.5 mg by mouth daily. Patient not taking:  Reported on 05/06/2020 03/19/20   [provider]  nitroGLYCERIN (NITROSTAT) 0.4 MG SL tablet Place 0.4 mg under the tongue every 5 (five) minutes as needed for chest pain.    [provider]  loratadine (CLARITIN) 10 MG tablet Take 1 tablet (10 mg total) by mouth daily. Take 1 tablet in the morning. As needed for itching. 02/14/17 02/17/19  Frederich Cha, MD  ranitidine (ZANTAC) 150 MG capsule Take 1 capsule (150 mg total) by mouth 2 (two) times daily. 02/14/17 02/17/19  Frederich Cha, MD   DG HIP UNILAT WITH PELVIS 2-3 VIEWS RIGHT  Result Date: 05/05/2020 CLINICAL DATA:  Right hip pain EXAM: DG HIP (WITH OR WITHOUT PELVIS) 2-3V RIGHT COMPARISON:  None. FINDINGS: Chronic fracture right femoral neck with superior displacement. There is deformity of the femoral head which is small with associated degenerative change in the right hip. There is persistent lucency through the fracture likely due to pseudoarthrosis. No other fracture or bone lesion.  Lumbar fusion hardware noted. 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 Electronically Signed   By: Franchot Gallo M.D.   On: 05/05/2020 10:08    Positive ROS: All other systems have been reviewed and were otherwise negative with the exception of those mentioned in the HPI and as above.  Physical Exam: General: Alert, no acute distress.  Patient examined in the emergency room.  MUSCULOSKELETAL: Right hip: Patient skin is intact.  There is no erythema ecchymosis or significant swelling.  Her thigh and leg compartments are soft and compressible.  She has palpable pedal pulses, intact/light touch and intact motor function distally.  She has shortening and external rotation of the right lower extremity.  Assessment: Right femoral neck hip fracture  Plan: I explained to the patient this evening the nature of her fracture.  I have recommended a right hip  hemiarthroplasty for treatment of her fracture.  I reviewed the details of the operation as well  as the postoperative course with the patient. I also discussed the risks and benefits of surgery. The risks include but are not limited to infection, bleeding requiring blood transfusion, nerve or blood vessel injury, joint stiffness or loss of motion, persistent pain, weakness or instability, fracture, dislocation, leg length discrepancy, change in lower extremity rotation and hardware failure and the need for further surgery. Medical risks include but are not limited to DVT and pulmonary embolism, myocardial infarction, stroke, pneumonia, respiratory failure and death. Patient understood these risks and wished to proceed.   I reviewed the patient's labs and radiographic studies in preparation for this case.  Patient will be n.p.o. after midnight.  Patient should not be given anticoagulation therapy in preparation for surgery tomorrow.   [place], MD    05/06/2020 7:02 PM

## 2020-05-06 NOTE — H&P (Signed)
History and Physical    Stacy Allen IWP:[id number] DOB: 12/20/1940 DOA: 05/06/2020  PCP: [place]   Patient coming from: Home  I have personally briefly reviewed patient's old medical records in [place]  Chief Complaint: Right hip pain  HPI: Stacy Allen is a 79 y.o. female with medical history significant for coronary artery disease, hypertension, dyslipidemia who presents to the ER at the request of her orthopedic surgeon for a right femoral neck fracture.  Patient states that for over 5 months she has had severe pain in her right hip for which she had seen different physicians. She has been treated for sciatica without any improvement in his symptoms. She has to ambulate with a rolling walker due to severe pain in her right hip.  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  Contracture of the right femoral head likely due to chronic AVN.  Patient was referred to the ER for planned surgical repair in a.m. She denies having any chest pain, no shortness of breath, no nausea, no vomiting, no fever, no chills, no cough, no abdominal pain or any changes in her bowel habits. 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 Twelve-lead EKG reviewed by me shows sinus rhythm with minimal ST depression in the inferior leads.   ED Course: Patient is a 79 year old female who was sent to the ER by orthopedic surgery for admission for planned surgery in a.m.  Patient is status post fall  and has a chronic right femoral neck fracture.  Review of Systems: As per HPI otherwise 10 point review of systems negative.    Past Medical History:  Diagnosis Date  . Adenomatous polyps   . Allergic state   . Anemia   . Anginal pain ([place])   . Anxiety   . Arthritis    osteoarthritis  . Broken rib    left  . Cancer ([place])    skin  . Chicken pox   . Complication of anesthesia    respiratory distress after rectocele surgery  . Coronary artery disease   . Depression   . Eczema   . Fibromyalgia   . GERD (gastroesophageal reflux disease)   . Headache    migraines  . Hemorrhoids   . History of hiatal hernia   . Hypercalcemia   . Hyperlipidemia   . Hypertension   . Hypothyroidism   . Lumbar stenosis   . Obesity   . Peptic ulcer disease   . Shortness of breath dyspnea   . Sleep apnea    No CPAP  . Vertigo   . Vitamin D deficiency     Past Surgical History:  Procedure Laterality Date  . ABDOMINAL HYSTERECTOMY    . BACK SURGERY  2007   Dr. Mauri Pole, [facility], Spinal Fusion  . CARDIAC CATHETERIZATION N/A 04/03/2015   Procedure: Left Heart Cath and Coronary Angiography;  Surgeon: Teodoro Spray, MD;  Location: [place] CV LAB;  Service: Cardiovascular;  Laterality: N/A;  . CATARACT EXTRACTION  W/PHACO Left 04/22/2019   Procedure: CATARACT EXTRACTION PHACO AND INTRAOCULAR LENS PLACEMENT (IOC) LEFT  00:45.4  12.9%  6.03;  Surgeon: Eulogio Bear, MD;  Location: [place];  Service: Ophthalmology;  Laterality: Left;  Latex sleep apnea  . CATARACT EXTRACTION W/PHACO Right 07/01/2019   Procedure: CATARACT EXTRACTION PHACO AND INTRAOCULAR LENS PLACEMENT (IOC) RIGHT 2.38  00:24.4;  Surgeon: Eulogio Bear, MD;  Location: [place];  Service: Ophthalmology;  Laterality: Right;  Latex Sleep apnea  . CHOLECYSTECTOMY    . CORONARY ANGIOPLASTY  2015   Dr. Ubaldo Glassing, [facility] Cath Lab  . ESOPHAGOGASTRODUODENOSCOPY (EGD) WITH PROPOFOL N/A 12/21/2016   Procedure:  ESOPHAGOGASTRODUODENOSCOPY (EGD) WITH PROPOFOL;  Surgeon: Manya Silvas, MD;  Location: [facility] ENDOSCOPY;  Service: Endoscopy;  Laterality: N/A;  . FRACTURE SURGERY Left 1991   Fractures Femur, [place] GUIDED SINUS SURGERY Bilateral 09/28/2015   Procedure: IMAGE GUIDED SINUS SURGERY, SEPTOPLASTY, BILATERAL FRONTAL SINUSOTOMIES, BILATERAL MAXILLARY ANTROSTOMIES, BILATERAL TOTAL ETHMOIDECTOMY, BILATERAL SPHENOIDECTOMY, BILATERAL INFERIOR TURBINATE REDUCTION;  Surgeon: Margaretha Sheffield, MD;  Location: [facility] ORS;  Service: ENT;  Laterality: Bilateral;  . RECTOCELE REPAIR       reports that she quit smoking about 21 years ago. Her smoking use included cigarettes. She has never used smokeless tobacco. She reports that she does not drink alcohol and does not use drugs.  Allergies  Allergen Reactions  . Zithromax [Azithromycin] Diarrhea    "fainting"  . Adhesive [Tape]     blisters  . Codeine Nausea And Vomiting  . Lactose Intolerance (Gi) Other (See Comments)    "stomach pain"  . Morphine And Related Hives  . Red Dye Itching    "red food dye"  . Shellfish-Derived Products Itching and Swelling  . Latex Rash    With extended contact  . Lodine [Etodolac] Rash    Family History  Problem Relation Age of Onset  . Hypertension Mother   . Cancer Mother   . Diabetes Father   . Hypertension Father   . Heart failure Father   . Breast cancer Neg Hx      Prior to Admission medications   Medication Sig Start Date End Date Taking? Authorizing Provider  acetaminophen (TYLENOL) 500 MG tablet Take 500 mg by mouth every 6 (six) hours as needed.    [provider]  albuterol (PROVENTIL HFA;VENTOLIN HFA) 108 (90 BASE) MCG/ACT inhaler Inhale 2 puffs into the lungs.    [provider]  aspirin 81 MG chewable tablet Chew by mouth daily.    [provider]  cetirizine (ZYRTEC) 10 MG tablet Take 1 tablet (10 mg total) by mouth daily. If needed at night for itching  not relieved by Claritin in the morning. 02/14/17   Frederich Cha, MD  chlorthalidone (HYGROTON) 25 MG tablet Take 25 mg by mouth daily.    [provider]  citalopram (CELEXA) 40 MG tablet Take 40 mg by mouth daily.    [provider]  clonazePAM (KLONOPIN) 1 MG tablet Take 1 mg by mouth 2 (two) times daily. As needed for anxiety    [provider]  diclofenac (FLECTOR) 1.3 % PTCH Place 1 patch onto the skin 2 (two) times daily. 02/10/20   Carmin Muskrat, MD  diclofenac sodium (VOLTAREN) 1 % GEL Apply 2 g topically 4 (four) times daily.    [provider]  enalapril (VASOTEC) 20 MG tablet Take 40 mg by mouth daily.     [provider]  ergocalciferol (VITAMIN D2) 50000 UNITS capsule Take 50,000 Units by mouth every 30 (thirty) days.    [provider]  fluticasone (VERAMYST) 27.5 MCG/SPRAY nasal spray Place 2 sprays into the nose daily.    [provider]  gabapentin (NEURONTIN) 300 MG capsule Take 600 mg by mouth 3 (three) times daily. Two tabs to total 600 mgm by mouth 3 times daily    [provider]  HYDROcodone-acetaminophen (NORCO/VICODIN) 5-325 MG tablet Take 1 tablet by mouth every 6 (six) hours as needed for severe pain. 02/10/20   Carmin Muskrat, MD  hydrocortisone (CORTEF) 10 MG tablet Take by mouth. 11/21/19   [provider]  hydrocortisone (CORTEF) 5 MG tablet Take by mouth. 11/21/19   [provider]  hydrOXYzine (ATARAX/VISTARIL) 10 MG tablet Take 10 mg by mouth 3 (three) times daily as needed.    [provider]  Iron, Ferrous Sulfate, 325 (65 Fe) MG TABS Take by mouth 2 (two) times daily.    [provider]  levothyroxine (SYNTHROID, LEVOTHROID) 112 MCG tablet Take 137 mcg by mouth daily before breakfast.     [provider]  meclizine (ANTIVERT) 25 MG tablet Take 25 mg by mouth 3 (three) times daily as needed for dizziness.    [provider]  metoprolol  tartrate (LOPRESSOR) 25 MG tablet Take 12.5 mg by mouth 2 (two) times daily.     [provider]  naproxen sodium (ALEVE) 220 MG tablet Take 220 mg by mouth.    [provider]  nitroGLYCERIN (NITROSTAT) 0.4 MG SL tablet Place 0.4 mg under the tongue every 5 (five) minutes as needed for chest pain.    [provider]  omeprazole (PRILOSEC) 20 MG capsule Take 40 mg by mouth 2 (two) times daily before a meal.     [provider]  predniSONE (DELTASONE) 20 MG tablet Take 2 tablets (40 mg total) by mouth daily with breakfast. For the next four days 02/10/20   Carmin Muskrat, MD  Propylene Glycol (SYSTANE COMPLETE OP) Apply to eye daily.    [provider]  tiZANidine (ZANAFLEX) 4 MG tablet Take 4 mg by mouth every 6 (six) hours as needed for muscle spasms.    [provider]  amLODipine (NORVASC) 5 MG tablet Take 5 mg by mouth 2 (two) times daily.  02/17/19  [provider]  loratadine (CLARITIN) 10 MG tablet Take 1 tablet (10 mg total) by mouth daily. Take 1 tablet in the morning. As needed for itching. 02/14/17 02/17/19  Frederich Cha, MD  ranitidine (ZANTAC) 150 MG capsule Take 1 capsule (150 mg total) by mouth 2 (two) times daily. 02/14/17 02/17/19  Frederich Cha, MD    Physical Exam: Vitals:   05/06/20 1129 05/06/20 1230  BP: (!) 169/100 128/85  Pulse: 66 67  Resp: 18 20  Temp: 98.8 F (37.1 C)   TempSrc: Oral   SpO2: 99% 100%  Weight: 102.1 kg   Height: 5\' 3"  (1.6 m)      Vitals:   05/06/20 1129 05/06/20 1230  BP: (!) 169/100 128/85  Pulse: 66 67  Resp: 18 20  Temp: 98.8 F (37.1 C)   TempSrc: Oral   SpO2: 99% 100%  Weight: 102.1 kg   Height: 5\' 3"  (1.6 m)     Constitutional: NAD, alert and oriented x 3. Appears comfortable and in no distress Eyes: PERRL, lids and conjunctivae normal ENMT: Mucous membranes are moist.  Neck: normal, supple, no masses, no thyromegaly  Respiratory: clear to auscultation bilaterally, no  wheezing, no crackles. Normal respiratory effort. No accessory muscle use.  Cardiovascular: Regular rate and rhythm, no murmurs / rubs / gallops. No extremity edema. 2+ pedal pulses. No carotid bruits.  Abdomen: no tenderness, no masses palpated. No hepatosplenomegaly. Bowel sounds positive.  Musculoskeletal: no clubbing / cyanosis. Decreased ROM in the right hip Skin: no rashes, lesions, ulcers.  Neurologic: No gross focal neurologic deficit. Psychiatric: Normal mood and affect.   Labs on Admission: I have personally reviewed following labs and imaging studies  CBC: Recent Labs  Lab 05/06/20 1131  WBC 7.2  HGB 12.0  HCT 37.7  MCV 92.9  PLT 846   Basic Metabolic Panel: Recent Labs  Lab 05/06/20 1131  NA 134*  K 4.0  CL 97*  CO2 27  GLUCOSE 141*  BUN 15  CREATININE 0.62  CALCIUM 8.6*   GFR: Estimated Creatinine Clearance: 67.2 mL/min (by C-G formula based on SCr of 0.62 mg/dL). Liver Function Tests: No results for input(s): AST, ALT, ALKPHOS, BILITOT, PROT, ALBUMIN in the last 168 hours. No results for input(s): LIPASE, AMYLASE in the last 168 hours. No results for input(s): AMMONIA in the last 168 hours. Coagulation Profile: Recent Labs  Lab 05/06/20 1131  INR 1.0   Cardiac Enzymes: No results for input(s): CKTOTAL, CKMB, CKMBINDEX, TROPONINI in the last 168 hours. BNP (last 3 results) No results for input(s): PROBNP in the last 8760 hours. HbA1C: No results for input(s): HGBA1C in the last 72 hours. CBG: No results for input(s): GLUCAP in the last 168 hours. Lipid Profile: No results for input(s): CHOL, HDL, LDLCALC, TRIG, CHOLHDL, LDLDIRECT in the last 72 hours. Thyroid Function Tests: No results for input(s): TSH, T4TOTAL, FREET4, T3FREE, THYROIDAB in the last 72 hours. Anemia Panel: No results for input(s): VITAMINB12, FOLATE, FERRITIN, TIBC, IRON, RETICCTPCT in the last 72 hours. Urine analysis:    Component Value Date/Time   COLORURINE YELLOW  12/02/2017 1249   APPEARANCEUR HAZY (A) 12/02/2017 1249   APPEARANCEUR CLEAR 07/23/2014 1444   LABSPEC 1.010 12/02/2017 1249   LABSPEC 1.005 07/23/2014 1444   PHURINE 6.5 12/02/2017 1249   GLUCOSEU NEGATIVE 12/02/2017 1249   GLUCOSEU NEGATIVE 07/23/2014 1444   HGBUR TRACE (A) 12/02/2017 1249   BILIRUBINUR NEGATIVE 12/02/2017 1249   BILIRUBINUR NEGATIVE 07/23/2014 1444   KETONESUR NEGATIVE 12/02/2017 1249   PROTEINUR NEGATIVE 12/02/2017 1249   NITRITE POSITIVE (A) 12/02/2017 1249   LEUKOCYTESUR SMALL (A) 12/02/2017 1249   LEUKOCYTESUR NEGATIVE 07/23/2014 1444    Radiological Exams on Admission: DG HIP UNILAT WITH PELVIS 2-3 VIEWS RIGHT  Result Date: 05/05/2020 CLINICAL DATA:  Right hip pain EXAM: DG HIP (WITH OR WITHOUT PELVIS) 2-3V RIGHT COMPARISON:  None. FINDINGS: Chronic fracture right femoral neck with superior displacement. There is deformity of the femoral head which is small with associated degenerative change in the right hip. There is persistent lucency through the fracture likely due to pseudoarthrosis. No other fracture or bone lesion.  Lumbar fusion hardware noted. 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 Electronically Signed   By: Franchot Gallo M.D.   On: 05/05/2020 10:08    EKG: Independently reviewed.  Sinus rhythm Minimal ST depression in the inferior leads  Assessment/Plan Principal Problem:   Closed right hip fracture [facility]) Active Problems:   Chronic pain syndrome   Essential hypertension   Hypothyroidism    Closed right hip fracture Status post  fall which appears to be mechanical Immobilize right lower extremity Pain control Muscle relaxants Orthopedic surgery consult    Hypertension Continue metoprolol and enalapril   Depression and anxiety Continue citalopram and clonazepam PRN   Hypothyroidism Continue Synthroid   GERD Continue  Protonix       DVT prophylaxis: SCD Code Status: Full code Family Communication: Greater than 50% of time was spent discussing patient's condition and plan of care with her at the bedside.  All questions and concerns have been addressed.  She verbalizes understanding and agrees with the plan. Disposition Plan: Rehab Consults called: Orthopedic surgery    Armonie Staten MD [facility]     05/06/2020, 1:53 PM

## 2020-05-07 ENCOUNTER — Encounter: Payer: Self-pay | Admitting: Internal Medicine

## 2020-05-07 ENCOUNTER — Inpatient Hospital Stay: Payer: Medicare HMO | Admitting: Anesthesiology

## 2020-05-07 ENCOUNTER — Inpatient Hospital Stay: Admission: RE | Admit: 2020-05-07 | Payer: Medicare HMO | Source: Ambulatory Visit | Admitting: Orthopedic Surgery

## 2020-05-07 ENCOUNTER — Inpatient Hospital Stay: Payer: Medicare HMO

## 2020-05-07 ENCOUNTER — Encounter
Admission: EM | Disposition: A | Discharge status: Skilled Nursing Facility | Payer: Self-pay | Source: Home / Self Care | Attending: Internal Medicine

## 2020-05-07 DIAGNOSIS — S72001K Fracture of unspecified part of neck of right femur, subsequent encounter for closed fracture with nonunion: Secondary | ICD-10-CM | POA: Diagnosis not present

## 2020-05-07 DIAGNOSIS — G894 Chronic pain syndrome: Secondary | ICD-10-CM | POA: Diagnosis not present

## 2020-05-07 DIAGNOSIS — E039 Hypothyroidism, unspecified: Secondary | ICD-10-CM | POA: Diagnosis not present

## 2020-05-07 DIAGNOSIS — I10 Essential (primary) hypertension: Secondary | ICD-10-CM | POA: Diagnosis not present

## 2020-05-07 HISTORY — PX: HIP ARTHROPLASTY: SHX981

## 2020-05-07 LAB — CBC
HCT: 35.7 % — ABNORMAL LOW (ref 36.0–46.0)
Hemoglobin: 11.5 g/dL — ABNORMAL LOW (ref 12.0–15.0)
MCH: 29.3 pg (ref 26.0–34.0)
MCHC: 32.2 g/dL (ref 30.0–36.0)
MCV: 91.1 fL (ref 80.0–100.0)
Platelets: 258 10*3/uL (ref 150–400)
RBC: 3.92 MIL/uL (ref 3.87–5.11)
RDW: 15.7 % — ABNORMAL HIGH (ref 11.5–15.5)
WBC: 5.7 10*3/uL (ref 4.0–10.5)
nRBC: 0 % (ref 0.0–0.2)

## 2020-05-07 LAB — BASIC METABOLIC PANEL
Anion gap: 8 (ref 5–15)
BUN: 14 mg/dL (ref 8–23)
CO2: 26 mmol/L (ref 22–32)
Calcium: 8.7 mg/dL — ABNORMAL LOW (ref 8.9–10.3)
Chloride: 102 mmol/L (ref 98–111)
Creatinine, Ser: 0.7 mg/dL (ref 0.44–1.00)
GFR, Estimated: >60 mL/min (ref >60)
Glucose, Bld: 96 mg/dL (ref 70–99)
Potassium: 4 mmol/L (ref 3.5–5.1)
Sodium: 136 mmol/L (ref 135–145)

## 2020-05-07 SURGERY — HEMIARTHROPLASTY, HIP, DIRECT ANTERIOR APPROACH, FOR FRACTURE
Anesthesia: Spinal | Site: Hip | Laterality: Right

## 2020-05-07 MED ORDER — ONDANSETRON HCL 4 MG/2ML IJ SOLN
INTRAMUSCULAR | Status: AC
  Filled 2020-05-07: qty 2

## 2020-05-07 MED ORDER — SODIUM CHLORIDE 0.9 % IV SOLN
INTRAVENOUS | Status: DC | PRN
  Administered 2020-05-07: 30 ug/min via INTRAVENOUS

## 2020-05-07 MED ORDER — ACETAMINOPHEN 325 MG PO TABS
325.0000 mg | ORAL_TABLET | Freq: Four times a day (QID) | ORAL | Status: DC | PRN
  Administered 2020-05-10 – 2020-05-13 (×2): 650 mg via ORAL
  Filled 2020-05-07 (×2): qty 2

## 2020-05-07 MED ORDER — ONDANSETRON HCL 4 MG PO TABS: 4.0000 mg | ORAL_TABLET | Freq: Four times a day (QID) | ORAL | Status: DC | PRN

## 2020-05-07 MED ORDER — SENNA 8.6 MG PO TABS
1.0000 | ORAL_TABLET | Freq: Two times a day (BID) | ORAL | Status: DC
  Administered 2020-05-07 – 2020-05-14 (×13): 8.6 mg via ORAL
  Filled 2020-05-07 (×13): qty 1

## 2020-05-07 MED ORDER — CHLORHEXIDINE GLUCONATE CLOTH 2 % EX PADS
6.0000 | MEDICATED_PAD | Freq: Every day | CUTANEOUS | Status: DC
  Administered 2020-05-07 – 2020-05-14 (×5): 6 via TOPICAL

## 2020-05-07 MED ORDER — HYDROMORPHONE HCL 1 MG/ML IJ SOLN: 0.5000 mg | INTRAMUSCULAR | Status: DC | PRN

## 2020-05-07 MED ORDER — FENTANYL CITRATE (PF) 100 MCG/2ML IJ SOLN
INTRAMUSCULAR | Status: DC | PRN
  Administered 2020-05-07 (×4): 25 ug via INTRAVENOUS

## 2020-05-07 MED ORDER — OXYCODONE HCL 5 MG PO TABS
5.0000 mg | ORAL_TABLET | ORAL | Status: DC | PRN
  Administered 2020-05-07 (×3): 5 mg via ORAL
  Administered 2020-05-09: 10 mg via ORAL
  Administered 2020-05-10: 5 mg via ORAL
  Administered 2020-05-10 – 2020-05-14 (×4): 10 mg via ORAL
  Filled 2020-05-07: qty 2
  Filled 2020-05-07: qty 1
  Filled 2020-05-07 (×3): qty 2
  Filled 2020-05-07: qty 1
  Filled 2020-05-07: qty 2

## 2020-05-07 MED ORDER — PHENYLEPHRINE HCL (PRESSORS) 10 MG/ML IV SOLN
INTRAVENOUS | Status: DC | PRN
  Administered 2020-05-07 (×2): 100 ug via INTRAVENOUS

## 2020-05-07 MED ORDER — PROPOFOL 10 MG/ML IV BOLUS
INTRAVENOUS | Status: AC
  Filled 2020-05-07: qty 20

## 2020-05-07 MED ORDER — IPRATROPIUM-ALBUTEROL 0.5-2.5 (3) MG/3ML IN SOLN
RESPIRATORY_TRACT | Status: AC
  Filled 2020-05-07: qty 3

## 2020-05-07 MED ORDER — FENTANYL CITRATE (PF) 100 MCG/2ML IJ SOLN
INTRAMUSCULAR | Status: AC
  Filled 2020-05-07: qty 2

## 2020-05-07 MED ORDER — FENTANYL CITRATE (PF) 100 MCG/2ML IJ SOLN
25.0000 ug | INTRAMUSCULAR | Status: DC | PRN
  Administered 2020-05-07 (×2): 25 ug via INTRAVENOUS

## 2020-05-07 MED ORDER — DEXMEDETOMIDINE (PRECEDEX) IN NS 20 MCG/5ML (4 MCG/ML) IV SYRINGE
PREFILLED_SYRINGE | INTRAVENOUS | Status: AC
  Filled 2020-05-07: qty 5

## 2020-05-07 MED ORDER — CEFAZOLIN SODIUM-DEXTROSE 2-4 GM/100ML-% IV SOLN
INTRAVENOUS | Status: AC
  Filled 2020-05-07: qty 100

## 2020-05-07 MED ORDER — SODIUM CHLORIDE 0.9 % IV SOLN
75.0000 mL/h | INTRAVENOUS | Status: DC
  Administered 2020-05-07 – 2020-05-08 (×2): 75 mL/h via INTRAVENOUS

## 2020-05-07 MED ORDER — BISACODYL 10 MG RE SUPP: 10.0000 mg | Freq: Every day | RECTAL | Status: DC | PRN

## 2020-05-07 MED ORDER — PROPOFOL 10 MG/ML IV BOLUS
INTRAVENOUS | Status: DC | PRN
  Administered 2020-05-07: 50 mg via INTRAVENOUS

## 2020-05-07 MED ORDER — ACETAMINOPHEN 10 MG/ML IV SOLN
INTRAVENOUS | Status: DC | PRN
  Administered 2020-05-07: 1000 mg via INTRAVENOUS

## 2020-05-07 MED ORDER — GLYCOPYRROLATE 0.2 MG/ML IJ SOLN
INTRAMUSCULAR | Status: AC
  Filled 2020-05-07: qty 1

## 2020-05-07 MED ORDER — FENTANYL CITRATE (PF) 100 MCG/2ML IJ SOLN
INTRAMUSCULAR | Status: AC
  Administered 2020-05-07: 25 ug via INTRAVENOUS
  Filled 2020-05-07: qty 2

## 2020-05-07 MED ORDER — PROPOFOL 500 MG/50ML IV EMUL
INTRAVENOUS | Status: DC | PRN
  Administered 2020-05-07: 75 ug/kg/min via INTRAVENOUS

## 2020-05-07 MED ORDER — ACETAMINOPHEN 500 MG PO TABS
1000.0000 mg | ORAL_TABLET | Freq: Four times a day (QID) | ORAL | Status: AC
  Administered 2020-05-07 – 2020-05-08 (×4): 1000 mg via ORAL
  Filled 2020-05-07 (×4): qty 2

## 2020-05-07 MED ORDER — ALUM & MAG HYDROXIDE-SIMETH 200-200-20 MG/5ML PO SUSP
30.0000 mL | ORAL | Status: DC | PRN
  Administered 2020-05-10 – 2020-05-11 (×2): 30 mL via ORAL
  Filled 2020-05-07 (×2): qty 30

## 2020-05-07 MED ORDER — ACETAMINOPHEN 10 MG/ML IV SOLN
INTRAVENOUS | Status: AC
  Filled 2020-05-07: qty 100

## 2020-05-07 MED ORDER — METOPROLOL TARTRATE 5 MG/5ML IV SOLN
INTRAVENOUS | Status: AC
  Filled 2020-05-07: qty 5

## 2020-05-07 MED ORDER — BUPIVACAINE HCL (PF) 0.5 % IJ SOLN
INTRAMUSCULAR | Status: DC | PRN
  Administered 2020-05-07: 2.6 mL via INTRATHECAL

## 2020-05-07 MED ORDER — OXYCODONE HCL 5 MG PO TABS
10.0000 mg | ORAL_TABLET | ORAL | Status: DC | PRN
  Administered 2020-05-08 – 2020-05-09 (×2): 10 mg via ORAL
  Administered 2020-05-12: 15 mg via ORAL
  Filled 2020-05-07 (×4): qty 2
  Filled 2020-05-07: qty 3

## 2020-05-07 MED ORDER — CEFAZOLIN SODIUM 1 G IJ SOLR
INTRAMUSCULAR | Status: AC
  Filled 2020-05-07: qty 10

## 2020-05-07 MED ORDER — NEOMYCIN-POLYMYXIN B GU 40-200000 IR SOLN
Status: DC | PRN
  Administered 2020-05-07: 12 mL

## 2020-05-07 MED ORDER — ENOXAPARIN SODIUM 60 MG/0.6ML ~~LOC~~ SOLN
0.5000 mg/kg | SUBCUTANEOUS | Status: DC
  Administered 2020-05-08 – 2020-05-14 (×6): 52.5 mg via SUBCUTANEOUS
  Filled 2020-05-07 (×6): qty 0.6

## 2020-05-07 MED ORDER — ONDANSETRON HCL 4 MG/2ML IJ SOLN: 4.0000 mg | Freq: Four times a day (QID) | INTRAMUSCULAR | Status: DC | PRN

## 2020-05-07 MED ORDER — PROPOFOL 500 MG/50ML IV EMUL
INTRAVENOUS | Status: AC
  Filled 2020-05-07: qty 50

## 2020-05-07 MED ORDER — DOCUSATE SODIUM 100 MG PO CAPS
100.0000 mg | ORAL_CAPSULE | Freq: Two times a day (BID) | ORAL | Status: DC
  Administered 2020-05-07 – 2020-05-14 (×14): 100 mg via ORAL
  Filled 2020-05-07 (×13): qty 1

## 2020-05-07 MED ORDER — DEXMEDETOMIDINE (PRECEDEX) IN NS 20 MCG/5ML (4 MCG/ML) IV SYRINGE
PREFILLED_SYRINGE | INTRAVENOUS | Status: DC | PRN
  Administered 2020-05-07: 4 ug via INTRAVENOUS

## 2020-05-07 MED ORDER — ONDANSETRON HCL 4 MG/2ML IJ SOLN
4.0000 mg | Freq: Once | INTRAMUSCULAR | Status: AC | PRN
  Administered 2020-05-07: 4 mg via INTRAVENOUS

## 2020-05-07 MED ORDER — ENSURE MAX PROTEIN PO LIQD
11.0000 [oz_av] | Freq: Every day | ORAL | Status: DC
  Administered 2020-05-08 – 2020-05-13 (×5): 11 [oz_av] via ORAL
  Filled 2020-05-07 (×2): qty 330

## 2020-05-07 MED ORDER — LACTATED RINGERS IV SOLN: INTRAVENOUS | Status: DC

## 2020-05-07 MED ORDER — POLYETHYLENE GLYCOL 3350 17 G PO PACK: 17.0000 g | PACK | Freq: Every day | ORAL | Status: DC | PRN

## 2020-05-07 MED ORDER — ZOLPIDEM TARTRATE 5 MG PO TABS: 5.0000 mg | ORAL_TABLET | Freq: Every evening | ORAL | Status: DC | PRN

## 2020-05-07 MED ORDER — METOPROLOL TARTRATE 5 MG/5ML IV SOLN
INTRAVENOUS | Status: DC | PRN
  Administered 2020-05-07 (×2): 2.5 mg via INTRAVENOUS

## 2020-05-07 MED ORDER — GLYCOPYRROLATE 0.2 MG/ML IJ SOLN
0.2000 mg | Freq: Once | INTRAMUSCULAR | Status: AC
  Administered 2020-05-07: 0.2 mg via INTRAVENOUS

## 2020-05-07 MED ORDER — CEFAZOLIN SODIUM-DEXTROSE 1-4 GM/50ML-% IV SOLN
1.0000 g | Freq: Four times a day (QID) | INTRAVENOUS | Status: AC
  Administered 2020-05-07 – 2020-05-08 (×2): 1 g via INTRAVENOUS
  Filled 2020-05-07 (×2): qty 50

## 2020-05-07 MED ORDER — IPRATROPIUM-ALBUTEROL 0.5-2.5 (3) MG/3ML IN SOLN: 3.0000 mL | Freq: Once | RESPIRATORY_TRACT | Status: DC

## 2020-05-07 SURGICAL SUPPLY — 63 items
BLADE SAGITTAL WIDE XTHICK NO (BLADE) ×2 IMPLANT
BLADE SURG SZ10 CARB STEEL (BLADE) ×2 IMPLANT
BNDG COHESIVE 4X5 TAN STRL (GAUZE/BANDAGES/DRESSINGS) ×2 IMPLANT
CANISTER SUCT 1200ML W/VALVE (MISCELLANEOUS) ×2 IMPLANT
CANISTER SUCT 3000ML PPV (MISCELLANEOUS) ×4 IMPLANT
COVER BACK TABLE REUSABLE LG (DRAPES) ×2 IMPLANT
COVER WAND RF STERILE (DRAPES) ×2 IMPLANT
DRAPE 3/4 80X56 (DRAPES) ×4 IMPLANT
DRAPE INCISE IOBAN 66X60 STRL (DRAPES) ×2 IMPLANT
DRAPE SPLIT 6X30 W/TAPE (DRAPES) ×4 IMPLANT
DRAPE SURG 17X11 SM STRL (DRAPES) ×2 IMPLANT
DRSG AQUACEL AG ADV 3.5X14 (GAUZE/BANDAGES/DRESSINGS) ×2 IMPLANT
DRSG OPSITE POSTOP 4X10 (GAUZE/BANDAGES/DRESSINGS) ×2 IMPLANT
DRSG OPSITE POSTOP 4X6 (GAUZE/BANDAGES/DRESSINGS) ×4 IMPLANT
DRSG OPSITE POSTOP 4X8 (GAUZE/BANDAGES/DRESSINGS) ×2 IMPLANT
DURAPREP 26ML APPLICATOR (WOUND CARE) ×8 IMPLANT
ELECT BLADE 6.5 EXT (BLADE) ×2 IMPLANT
ELECT CAUTERY BLADE 6.4 (BLADE) ×2 IMPLANT
ELECT REM PT RETURN 9FT ADLT (ELECTROSURGICAL) ×2
ELECTRODE REM PT RTRN 9FT ADLT (ELECTROSURGICAL) ×1 IMPLANT
GAUZE SPONGE 4X4 12PLY STRL (GAUZE/BANDAGES/DRESSINGS) ×2 IMPLANT
GAUZE XEROFORM 1X8 LF (GAUZE/BANDAGES/DRESSINGS) ×4 IMPLANT
GLOVE BIOGEL PI IND STRL 9 (GLOVE) ×1 IMPLANT
GLOVE BIOGEL PI INDICATOR 9 (GLOVE) ×1
GLOVE SURG 9.0 ORTHO LTXF (GLOVE) ×4 IMPLANT
GOWN STRL REUS TWL 2XL XL LVL4 (GOWN DISPOSABLE) ×2 IMPLANT
GOWN STRL REUS W/ TWL LRG LVL3 (GOWN DISPOSABLE) ×1 IMPLANT
GOWN STRL REUS W/TWL LRG LVL3 (GOWN DISPOSABLE) ×1
HEAD MODULAR ENDO (Orthopedic Implant) ×1 IMPLANT
HEAD UNPLR 51XMDLR STRL HIP (Orthopedic Implant) ×1 IMPLANT
HEMOVAC 400ML (MISCELLANEOUS) ×2
KIT DRAIN HEMOVAC JP 7FR 400ML (MISCELLANEOUS) ×1 IMPLANT
KIT PEG BOARD PINK (KITS) ×2 IMPLANT
KIT PREVENA INCISION MGT20CM45 (CANNISTER) ×2 IMPLANT
KIT TURNOVER KIT A (KITS) ×2 IMPLANT
MANIFOLD NEPTUNE II (INSTRUMENTS) ×2 IMPLANT
NDL SAFETY ECLIPSE 18X1.5 (NEEDLE) ×1 IMPLANT
NEEDLE FILTER BLUNT 18X 1/2SAF (NEEDLE) ×1
NEEDLE FILTER BLUNT 18X1 1/2 (NEEDLE) ×1 IMPLANT
NEEDLE HYPO 18GX1.5 SHARP (NEEDLE) ×1
NEEDLE MAYO CATGUT SZ4 (NEEDLE) ×2 IMPLANT
NS IRRIG 1000ML POUR BTL (IV SOLUTION) ×2 IMPLANT
PACK HIP PROSTHESIS (MISCELLANEOUS) ×2 IMPLANT
PILLOW ABDUCTION FOAM SM (MISCELLANEOUS) ×2 IMPLANT
PULSAVAC PLUS IRRIG FAN TIP (DISPOSABLE) ×2
RETRIEVER SUT HEWSON (MISCELLANEOUS) ×2 IMPLANT
SLEEVE UNITRAX V40 (Orthopedic Implant) ×1 IMPLANT
SLEEVE UNITRAX V40 +4 (Orthopedic Implant) ×1 IMPLANT
SOL .9 NS 3000ML IRR  AL (IV SOLUTION) ×1
SOL .9 NS 3000ML IRR UROMATIC (IV SOLUTION) ×1 IMPLANT
STAPLER SKIN PROX 35W (STAPLE) ×2 IMPLANT
STEM HIP 127 DEG (Stem) ×2 IMPLANT
SUT TICRON 2-0 30IN 311381 (SUTURE) ×8 IMPLANT
SUT VIC AB 0 CT1 36 (SUTURE) ×6 IMPLANT
SUT VIC AB 2-0 CT2 27 (SUTURE) ×4 IMPLANT
SUT VIC AB 4-0 PS2 18 (SUTURE) ×2 IMPLANT
SYR 10ML LL (SYRINGE) ×2 IMPLANT
TAPE MICROFOAM 4IN (TAPE) ×2 IMPLANT
TAPE TRANSPORE STRL 2 31045 (GAUZE/BANDAGES/DRESSINGS) ×2 IMPLANT
TIP BRUSH PULSAVAC PLUS 24.33 (MISCELLANEOUS) ×2 IMPLANT
TIP FAN IRRIG PULSAVAC PLUS (DISPOSABLE) ×1 IMPLANT
TUBE SUCT KAM VAC (TUBING) IMPLANT
WAND WEREWOLF FASTSEAL 6.0 (MISCELLANEOUS) ×2 IMPLANT

## 2020-05-07 NOTE — Progress Notes (Signed)
Initial Nutrition Assessment  DOCUMENTATION CODES:   Morbid obesity  INTERVENTION:  Provide Ensure Max Protein po once daily, each supplement provides 150 kcal and 30 grams of protein.  NUTRITION DIAGNOSIS:   Increased nutrient needs related to hip fracture as evidenced by estimated needs.  GOAL:   Patient will meet greater than or equal to 90% of their needs  MONITOR:   PO intake, Supplement acceptance, Labs, Weight trends, Skin, I & O's  REASON FOR ASSESSMENT:   Consult Hip fracture protocol  ASSESSMENT:   79 y.o. female with medical history significant for coronary artery disease, hypertension, dyslipidemia, OSA and GERD who presents to the ER at the request of her orthopedic surgeon for a right femoral neck fracture.   Met with patient at bedside. She reports she has a good appetite and intake at baseline. She typically eats 2 meals per day and an evening snack. Breakfast is usually [place] bacon, egg beaters, and English muffins. Her second meal of the day is around 2pm and she will have meat with cooked broccoli and white or brown rice. She has an evening snack of cereal in almond milk. Patient reports she is lactose-intolerant. Discussed increased nutrient needs for post-operative healing. She is amenable to drinking Ensure Max Protein to help meet increased needs.  Patient reports she is weight-stable at 220-230 lbs. Patient is currently 104.3 kg (229.9 lbs) according to chart.  Medications reviewed and include: ferrous sulfate 325 mg BID, gabapentin, levothyroxine, Remeron 15 mg QHS, Protonix, vitamin D 50000 units every 30 days.  Labs reviewed.  Patient does not meet criteria for malnutrition at this time.  NUTRITION - FOCUSED PHYSICAL EXAM:    Most Recent Value  Orbital Region No depletion  Upper Arm Region No depletion  Thoracic and Lumbar Region No depletion  Buccal Region No depletion  Temple Region No depletion  Clavicle Bone Region No depletion   Clavicle and Acromion Bone Region No depletion  Scapular Bone Region No depletion  Dorsal Hand No depletion  Patellar Region No depletion  Anterior Thigh Region No depletion  Posterior Calf Region No depletion  Edema (RD Assessment) None  Hair Reviewed  Eyes Reviewed  Mouth Reviewed  Skin Reviewed  Nails Reviewed     Diet Order:   Diet Order            Diet NPO time specified  Diet effective midnight                EDUCATION NEEDS:   Education needs have been addressed  Skin:  Skin Assessment: Reviewed RN Assessment  Last BM:  11/10  Height:   Ht Readings from Last 1 Encounters:  05/06/20 5' 3"  (1.6 m)   Weight:   Wt Readings from Last 1 Encounters:  05/06/20 104.3 kg   Ideal Body Weight:  52.3 kg  BMI:  Body mass index is 40.72 kg/m.  Estimated Nutritional Needs:   Kcal:  1900-2200kcal/day  Protein:  95-110g/day  Fluid:  1.5L/day  Jacklynn Barnacle, MS, RD, LDN Pager number available on Amion

## 2020-05-07 NOTE — H&P (Signed)
The patient has been re-examined, and the chart reviewed, and there have been no interval changes to the documented history and physical.    The risks, benefits, and alternatives have been discussed at length, and the patient is willing to proceed.    In further interviewing the patient, patient first injured her hip in April. She believes that her hip may have fractured in April. She has been limited in her ability to weight-bear since April.  I explained to the patient that if this fracture is that chronic, it may be difficult to regain full leg length of the right lower extremity. She may have shortening of the right lower extremity and may require a shoe lift postoperatively to accommodate any significant leg length discrepancy. Patient understood this and wished to proceed.

## 2020-05-07 NOTE — Progress Notes (Signed)
PHARMACIST - PHYSICIAN COMMUNICATION  CONCERNING:  Enoxaparin (Lovenox) for DVT Prophylaxis    RECOMMENDATION: Patient was prescribed enoxaprin 40mg  q24 hours for VTE prophylaxis.   Filed Weights   05/06/20 1129 05/06/20 2257  Weight: 102.1 kg (225 lb) 104.3 kg (229 lb 14.4 oz)    Body mass index is 40.72 kg/m.  Estimated Creatinine Clearance: 68.1 mL/min (by C-G formula based on SCr of 0.7 mg/dL).  Based on [place] patient is candidate for enoxaparin 0.5mg /kg TBW SQ every 24 hours based on BMI being >30.  DESCRIPTION: Pharmacy has adjusted enoxaparin dose per [facility] policy.  Patient is now receiving enoxaparin 52.5 mg every 24 hours   Benn Moulder, PharmD Pharmacy Resident  05/07/2020 6:44 PM

## 2020-05-07 NOTE — Anesthesia Procedure Notes (Signed)
Spinal  Patient location during procedure: OR Start time: 05/07/2020 1:55 PM End time: 05/07/2020 2:00 PM Staffing Performed: anesthesiologist  Anesthesiologist: Tera Mater, MD Preanesthetic Checklist Completed: patient identified, IV checked, site marked, risks and benefits discussed, surgical consent, monitors and equipment checked, pre-op evaluation and timeout performed Spinal Block Patient position: sitting Prep: ChloraPrep Patient monitoring: heart rate, cardiac monitor, continuous pulse ox and blood pressure Approach: midline Location: L4-5 Injection technique: single-shot Needle Needle type: Sprotte  Needle gauge: 24 G Needle length: 9 cm Assessment Sensory level: T4

## 2020-05-07 NOTE — Op Note (Signed)
05/07/2020  5:40 PM  PATIENT:  Stacy Allen   MRN: [id number]  PRE-OPERATIVE DIAGNOSIS:  Right Femoral Neck Hip Fracture  POST-OPERATIVE DIAGNOSIS:  Right Femoral Neck Hip Fracture  PROCEDURE:  Procedure(s): Right hip hemiarthroplasty  PREOPERATIVE INDICATIONS:    Stacy Allen is an 79 y.o. female who was admitted with a diagnosis of Right Femoral Neck Hip Fracture.  I have recommended surgical fixation with hemiarthroplasty for this injury. I have explained the surgery and the postoperative course to the patient who agreed with surgical management of this fracture.    The risks benefits and alternatives were discussed with the patient and their family including but not limited to the risks of  infection requiring removal of the prosthesis, bleeding requiring blood transfusion, nerve injury especially to the sciatic nerve leading to foot drop or lower extremity numbness, periprosthetic fracture, dislocation leg length discrepancy, change in lower extremity rotation persistent hip pain, loosening or failure of the components and the need for revision surgery. Medical risks include but are not limited to DVT and pulmonary embolism, myocardial infarction, stroke, pneumonia, respiratory failure and death.  OPERATIVE REPORT     SURGEON:  Thornton Park, MD    ASSISTANT:  Roland Rack, PA    ANESTHESIA:  spinal    COMPLICATIONS:  None.     COMPONENTS:  Stryker Accolade 2 femoral component size 5 with a 51 mm +4 neck adjustment sleeve.    PROCEDURE IN DETAIL:   The patient was met in the holding area and  identified.  The appropriate hip was identified and marked at the operative site after verbally confirming with the patient that this was the correct site of surgery.  The patient was then transported to the OR and underwent spinal anesthesia.  The patient was then placed in the lateral decubitus position with the operative side up and secured on the operating room table with a  pegboard and all bony prominences were adequately padded. This included an axillary roll and additional padding around the nonoperative leg to prevent compression to the common peroneal nerve.    The operative lower extremity was prepped and draped in a sterile fashion. A time out was performed prior to incision to verify patient's name, date of birth, medical record number, correct site of surgery correct procedure to be performed. The timeout was also used to verify the patient received antibiotics now appropriate instruments, implants and radiographic studies were available in the room. Once all in attendance were in agreement case began.    A posterolateral approach was utilized via sharp dissection  carried down to the subcutaneous tissue.  Bleeding vessels were coagulated using electrocautery.  The fascia lata was identified and incised along the length of the skin incision.  The gluteus maximus muscle was then split in line with its fibers. Self-retaining retractors were  inserted.  With the hip internally rotated, the short external rotators  were identified and removed from the posterior attachment from the greater trochanter. The piriformis was tagged for later repair. The capsule was identified and a T-shaped capsulotomy was performed. The capsule was tagged with #2 Tycron for later repair.  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  This was removed with a rongeur in multiple fragments.   Trial femoral heads were then placed into the acetabulum to determine best fit for the femoral head prosthesis.  The best fit was found to be a  size 51.  The attention was then turned to proximal femur preparation.  An oscillating saw was used to perform a proximal femoral osteotomy 1 fingerbreadth above the lesser trochanter.  A femoral skid and Cobra retractor were placed under the femoral neck to allow for adequate visualization. A box  osteotome was used to make the initial entry into the proximal femur. A single hand reamer was used to prepare the femoral canal. A T-shaped femoral canal sounder was then used to ensure no penetration femoral cortex had occurred during reaming. The proximal femur was then sequentially broached by hand. A size 5 femoral trial broach was found to have best medial to lateral canal fit. Once adequate mediolateral canal fill was achieved the trial femoral broach, neck, and head was assembled and the hip was reduced. It was found to have excellent stability, equivalent leg lengths with functional range of motion. The trial components were then removed.  I copiously irrigated the femoral canal and then impacted the real femoral prosthesis into place into the appropriate version, slightly anteverted to the normal anatomy, and I impacted the actual 51 mm Unitrax femoral component with a +4 neck adjustment sleeve into place. The hip was then reduced and taken through functional range of motion and found to have excellent stability. Leg lengths were restored. The hip joint was copiously irrigated.   A soft tissue repair of the capsule and external rotators was performed using #2 Tycron Excellent posterior capsular repair was achieved. The fascia lata was then closed with interrupted 0 Vicryl suture. The subcutaneous tissues were closed with 2-0 Vicryl and the skin approximated with staples.   The patient was then placed supine on the operative table. Leg lengths were checked clinically and found to be equivalent. An abduction pillow was placed between the lower extremities. The patient was then transferred to a hospital bed and brought to the PACU in stable condition. I was scrubbed and present the entire case and all sharp and instrument counts were correct at the conclusion of the case. I spoke with the patient's niece in the postop consultation room to let her know the case was completed without complication patient  was stable in recovery room.   Timoteo Gaul, MD Orthopedic Surgeon

## 2020-05-07 NOTE — Anesthesia Preprocedure Evaluation (Addendum)
Anesthesia Evaluation  Patient identified by MRN, date of birth, ID band Patient awake    Reviewed: Allergy & Precautions, H&P , NPO status , [name] Chart, lab work & pertinent test results  History of Anesthesia Complications Negative for: history of anesthetic complications  Airway Mallampati: II  TM Distance: >3 FB     Dental  (+) Chipped   Pulmonary neg pulmonary ROS, shortness of breath, sleep apnea , neg COPD, former smoker,     + decreased breath sounds      Cardiovascular hypertension, (-) angina+ CAD and + Cardiac Stents  (-) Past MI (-) dysrhythmias  Rhythm:regular Rate:Normal     Neuro/Psych  Headaches, PSYCHIATRIC DISORDERS Anxiety Depression    GI/Hepatic Neg liver ROS, hiatal hernia, PUD, GERD  ,  Endo/Other  Hypothyroidism Morbid obesity  Renal/GU      Musculoskeletal   Abdominal   Peds  Hematology  (+) Blood dyscrasia, anemia ,   Anesthesia Other Findings Past Medical History: No date: Adenomatous polyps No date: Allergic state No date: Anemia No date: Anginal pain ([place]) No date: Anxiety No date: Arthritis     Comment:  osteoarthritis No date: Broken rib     Comment:  left No date: Cancer ([place])     Comment:  skin No date: Chicken pox No date: Complication of anesthesia     Comment:  respiratory distress after rectocele surgery No date: Coronary artery disease No date: Depression No date: Eczema No date: Fibromyalgia No date: GERD (gastroesophageal reflux disease) No date: Headache     Comment:  migraines No date: Hemorrhoids No date: History of hiatal hernia No date: Hypercalcemia No date: Hyperlipidemia No date: Hypertension No date: Hypothyroidism No date: Lumbar stenosis No date: Obesity No date: Peptic ulcer disease No date: Shortness of breath dyspnea No date: Sleep apnea     Comment:  No CPAP No date: Vertigo No date: Vitamin D deficiency  Past Surgical  History: No date: ABDOMINAL HYSTERECTOMY 2007: BACK SURGERY     Comment:  Dr. Mauri Pole, [facility], Spinal Fusion 04/03/2015: CARDIAC CATHETERIZATION; N/A     Comment:  Procedure: Left Heart Cath and Coronary Angiography;                Surgeon: Teodoro Spray, MD;  Location: [place] CV               LAB;  Service: Cardiovascular;  Laterality: N/A; 04/22/2019: CATARACT EXTRACTION W/PHACO; Left     Comment:  Procedure: CATARACT EXTRACTION PHACO AND INTRAOCULAR               LENS PLACEMENT (IOC) LEFT  00:45.4  12.9%  6.03;                Surgeon: Eulogio Bear, MD;  Location: [place];  Service: Ophthalmology;  Laterality: Left;              Latex sleep apnea 07/01/2019: CATARACT EXTRACTION W/PHACO; Right     Comment:  Procedure: CATARACT EXTRACTION PHACO AND INTRAOCULAR               LENS PLACEMENT (IOC) RIGHT 2.38  00:24.4;  Surgeon: Eulogio Bear, MD;  Location: [place];                Service: Ophthalmology;  Laterality: Right;  [place] Sleep              apnea No date: CHOLECYSTECTOMY 2015: CORONARY ANGIOPLASTY     Comment:  Dr. Ubaldo Glassing, [facility] Cath Lab 12/21/2016: ESOPHAGOGASTRODUODENOSCOPY (EGD) WITH PROPOFOL; N/A     Comment:  Procedure: ESOPHAGOGASTRODUODENOSCOPY (EGD) WITH               PROPOFOL;  Surgeon: Manya Silvas, MD;  Location:               [facility] ENDOSCOPY;  Service: Endoscopy;  Laterality: N/A; 1991: FRACTURE SURGERY; Left     Comment:  Fractures Femur, [facility] 09/28/2015: IMAGE GUIDED SINUS SURGERY; Bilateral     Comment:  Procedure: IMAGE GUIDED SINUS SURGERY, SEPTOPLASTY,               BILATERAL FRONTAL SINUSOTOMIES, BILATERAL MAXILLARY               ANTROSTOMIES, BILATERAL TOTAL ETHMOIDECTOMY, BILATERAL               SPHENOIDECTOMY, BILATERAL INFERIOR TURBINATE REDUCTION;                Surgeon: Margaretha Sheffield, MD;  Location: [facility] ORS;  Service:              ENT;  Laterality: Bilateral; No date:  RECTOCELE REPAIR  BMI    Body Mass Index: 40.72 kg/m      Reproductive/Obstetrics negative OB ROS                            Anesthesia Physical Anesthesia Plan  ASA: III  Anesthesia Plan: Spinal   Post-op Pain Management:    Induction:   PONV Risk Score and Plan: Propofol infusion and Ondansetron  Airway Management Planned: Simple Face Mask  Additional Equipment:   Intra-op Plan:   Post-operative Plan:   Informed Consent: I have reviewed the patients History and Physical, chart, labs and discussed the procedure including the risks, benefits and alternatives for the proposed anesthesia with the patient or authorized representative who has indicated his/her understanding and acceptance.     Dental Advisory Given  Plan Discussed with: Anesthesiologist, CRNA and Surgeon  Anesthesia Plan Comments:         Anesthesia Quick Evaluation

## 2020-05-07 NOTE — Progress Notes (Signed)
PROGRESS NOTE    Stacy Allen  FMB:[id number] DOB: 09-05-1940 DOA: 05/06/2020 PCP: [place]    Brief Narrative:  Stacy Allen is a 79 y.o. female with medical history significant for coronary artery disease, hypertension, dyslipidemia who presents to the ER at the request of her orthopedic surgeon for a right femoral neck fracture    Consultants:   Orthopedics  Procedures:   Antimicrobials:       Subjective: No complaints this am.  Pain is controlled  Objective: Vitals:   05/06/20 2248 05/06/20 2257 05/07/20 0452 05/07/20 0744  BP: (!) 166/89  (!) 169/95 (!) 160/85  Pulse: 63  70 66  Resp:   16 16  Temp: 97.7 F (36.5 C)  97.8 F (36.6 C) 98.6 F (37 C)  TempSrc: Oral  Oral Oral  SpO2: 98%  98% 98%  Weight:  104.3 kg    Height:  5\' 3"  (1.6 m)      Intake/Output Summary (Last 24 hours) at 05/07/2020 0931 Last data filed at 05/07/2020 0504 Gross per 24 hour  Intake --  Output 700 ml  Net -700 ml   Filed Weights   05/06/20 1129 05/06/20 2257  Weight: 102.1 kg 104.3 kg    Examination:  General exam: Appears calm and comfortable  Respiratory system: Clear to auscultation. Respiratory effort normal. Cardiovascular system: S1 & S2 heard, RRR. No JVD, murmurs, rubs, gallops or clicks. No pedal edema. Gastrointestinal system: Abdomen is nondistended, soft and nontender. No organomegaly or masses felt. Normal bowel sounds heard. Central nervous system: Alert and oriented. No focal neurological deficits. Extremities: No edema Skin warm dry Psychiatry: Judgement and insight appear normal. Mood & affect appropriate.     Data Reviewed: I have personally reviewed following labs and imaging studies  CBC: Recent Labs  Lab 05/06/20 1131 05/07/20 0509  WBC 7.2 5.7  HGB 12.0 11.5*  HCT 37.7 35.7*  MCV 92.9 91.1  PLT 333 701   Basic Metabolic Panel: Recent Labs  Lab 05/06/20 1131 05/07/20 0509  NA 134* 136  K 4.0 4.0  CL 97*  102  CO2 27 26  GLUCOSE 141* 96  BUN 15 14  CREATININE 0.62 0.70  CALCIUM 8.6* 8.7*   GFR: Estimated Creatinine Clearance: 68.1 mL/min (by C-G formula based on SCr of 0.7 mg/dL). Liver Function Tests: No results for input(s): AST, ALT, ALKPHOS, BILITOT, PROT, ALBUMIN in the last 168 hours. No results for input(s): LIPASE, AMYLASE in the last 168 hours. No results for input(s): AMMONIA in the last 168 hours. Coagulation Profile: Recent Labs  Lab 05/06/20 1131  INR 1.0   Cardiac Enzymes: No results for input(s): CKTOTAL, CKMB, CKMBINDEX, TROPONINI in the last 168 hours. BNP (last 3 results) No results for input(s): PROBNP in the last 8760 hours. HbA1C: No results for input(s): HGBA1C in the last 72 hours. CBG: No results for input(s): GLUCAP in the last 168 hours. Lipid Profile: No results for input(s): CHOL, HDL, LDLCALC, TRIG, CHOLHDL, LDLDIRECT in the last 72 hours. Thyroid Function Tests: No results for input(s): TSH, T4TOTAL, FREET4, T3FREE, THYROIDAB in the last 72 hours. Anemia Panel: No results for input(s): VITAMINB12, FOLATE, FERRITIN, TIBC, IRON, RETICCTPCT in the last 72 hours. Sepsis Labs: No results for input(s): PROCALCITON, LATICACIDVEN in the last 168 hours.  Recent Results (from the past 240 hour(s))  Respiratory Panel by RT PCR (Flu A&B, Covid) - Nasopharyngeal Swab     Status: None   Collection Time: 05/06/20 12:43  PM   Specimen: Nasopharyngeal Swab  Result Value Ref Range Status   SARS Coronavirus 2 by RT PCR NEGATIVE NEGATIVE Final    Comment: (NOTE) SARS-CoV-2 target nucleic acids are NOT DETECTED.  The SARS-CoV-2 RNA is generally detectable in upper respiratoy specimens during the acute phase of infection. The lowest concentration of SARS-CoV-2 viral copies this assay can detect is 131 copies/mL. A negative result does not preclude SARS-Cov-2 infection and should not be used as the sole basis for treatment or other patient management decisions.  A negative result may occur with  improper specimen collection/handling, submission of specimen other than nasopharyngeal swab, presence of viral mutation(s) within the areas targeted by this assay, and inadequate number of viral copies (<131 copies/mL). A negative result must be combined with clinical observations, patient history, and epidemiological information. The expected result is Negative.  Fact Sheet for Patients:  PinkCheek.be  Fact Sheet for Healthcare Providers:  GravelBags.it  This test is no t yet approved or cleared by the [place] FDA and  has been authorized for detection and/or diagnosis of SARS-CoV-2 by FDA under an Emergency Use Authorization (EUA). This EUA will remain  in effect (meaning this test can be used) for the duration of the COVID-19 declaration under Section 564(b)(1) of the Act, 21 U.S.C. section 360bbb-3(b)(1), unless the authorization is terminated or revoked sooner.     Influenza A by PCR NEGATIVE NEGATIVE Final   Influenza B by PCR NEGATIVE NEGATIVE Final    Comment: (NOTE) The Xpert Xpress SARS-CoV-2/FLU/RSV assay is intended as an aid in  the diagnosis of influenza from Nasopharyngeal swab specimens and  should not be used as a sole basis for treatment. Nasal washings and  aspirates are unacceptable for Xpert Xpress SARS-CoV-2/FLU/RSV  testing.  Fact Sheet for Patients: PinkCheek.be  Fact Sheet for Healthcare Providers: GravelBags.it  This test is not yet approved or cleared by the [place] FDA and  has been authorized for detection and/or diagnosis of SARS-CoV-2 by  FDA under an Emergency Use Authorization (EUA). This EUA will remain  in effect (meaning this test can be used) for the duration of the  Covid-19 declaration under Section 564(b)(1) of the Act, 21  U.S.C. section 360bbb-3(b)(1), unless the  authorization is  terminated or revoked. Performed at [facility], [address] [place], [place] [address]          Radiology Studies: DG HIP UNILAT WITH PELVIS 2-3 VIEWS RIGHT  Result Date: 05/05/2020 CLINICAL DATA:  Right hip pain EXAM: DG HIP (WITH OR WITHOUT PELVIS) 2-3V RIGHT COMPARISON:  None. FINDINGS: Chronic fracture right femoral neck with superior displacement. There is deformity of the femoral head which is small with associated degenerative change in the right hip. There is persistent lucency through the fracture likely due to pseudoarthrosis. No other fracture or bone lesion.  Lumbar fusion hardware noted. 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 Electronically Signed   By: Franchot Gallo M.D.   On: 05/05/2020 10:08        Scheduled Meds: . citalopram  40 mg Oral Daily  . enalapril  40 mg Oral Daily  . ferrous sulfate  325 mg Oral BID  . fluticasone  2 spray Each Nare Daily  . gabapentin  600 mg Oral TID  . levothyroxine  137 mcg Oral QAC breakfast  . loratadine  10 mg Oral Daily  . metoprolol tartrate  12.5 mg  Oral BID  . mirtazapine  15 mg Oral QHS  . pantoprazole  40 mg Oral Daily  . polyvinyl alcohol   Both Eyes Daily  . [START ON 05/26/2020] Vitamin D (Ergocalciferol)  50,000 Units Oral Q30 days   Continuous Infusions: .  ceFAZolin (ANCEF) IV      Assessment & Plan:   Principal Problem:   Closed right hip fracture [facility]) Active Problems:   Chronic pain syndrome   Essential hypertension   Hypothyroidism   Closed right hip fracture Status post fall which appears to be mechanical 11/11-plan for surgery today  Ortho following  Pain control      Hypertension Stable.  Continue beta blk and acei   Depression and anxiety Continue with citalopram and clonazepam as needed     Hypothyroidism Continue  Synthroid   GERD Continue Protonix    DVT prophylaxis: SCD Code Status: Full Family Communication: None at bedside  Status is: Inpatient  Remains inpatient appropriate because:Ongoing diagnostic testing needed not appropriate for outpatient work up   Dispo: The patient is from: Home              Anticipated d/c is to: TBD              Anticipated d/c date is: 2 days              Patient currently is not medically stable to d/c.  Plan for surgery today            LOS: 1 day   Time spent: 35 minutes with more than 50% on [place], MD [facility] Pager [phone]  If 7PM-7AM, please contact night-coverage [email or web address] Password [facility] 05/07/2020, 9:31 AM

## 2020-05-07 NOTE — Plan of Care (Signed)

## 2020-05-07 NOTE — Transfer of Care (Signed)
Immediate Anesthesia Transfer of Care Note  Patient: Stacy Allen  Procedure(s) Performed: ARTHROPLASTY RIGHT HIP (HEMIARTHROPLASTY) (Right Hip)  Patient Location: PACU  Anesthesia Type:Spinal  Level of Consciousness: drowsy  Airway & Oxygen Therapy: Patient Spontanous Breathing and Patient connected to face mask oxygen  Post-op Assessment: Report given to RN  Post vital signs: stable  Last Vitals:  Vitals Value Taken Time  BP 109/80 05/07/20 1655  Temp 36.3 C 05/07/20 1655  Pulse 60 05/07/20 1655  Resp 19 05/07/20 1655  SpO2 100 % 05/07/20 1655    Last Pain:  Vitals:   05/07/20 1257  TempSrc: Tympanic  PainSc: 4          Complications: No complications documented.

## 2020-05-08 ENCOUNTER — Encounter: Payer: Self-pay | Admitting: Orthopedic Surgery

## 2020-05-08 DIAGNOSIS — I10 Essential (primary) hypertension: Secondary | ICD-10-CM | POA: Diagnosis not present

## 2020-05-08 DIAGNOSIS — S72001K Fracture of unspecified part of neck of right femur, subsequent encounter for closed fracture with nonunion: Secondary | ICD-10-CM | POA: Diagnosis not present

## 2020-05-08 DIAGNOSIS — G894 Chronic pain syndrome: Secondary | ICD-10-CM | POA: Diagnosis not present

## 2020-05-08 DIAGNOSIS — E039 Hypothyroidism, unspecified: Secondary | ICD-10-CM | POA: Diagnosis not present

## 2020-05-08 LAB — CBC
HCT: 28.8 % — ABNORMAL LOW (ref 36.0–46.0)
Hemoglobin: 9.3 g/dL — ABNORMAL LOW (ref 12.0–15.0)
MCH: 29.7 pg (ref 26.0–34.0)
MCHC: 32.3 g/dL (ref 30.0–36.0)
MCV: 92 fL (ref 80.0–100.0)
Platelets: 238 10*3/uL (ref 150–400)
RBC: 3.13 MIL/uL — ABNORMAL LOW (ref 3.87–5.11)
RDW: 15.6 % — ABNORMAL HIGH (ref 11.5–15.5)
WBC: 9.3 10*3/uL (ref 4.0–10.5)
nRBC: 0 % (ref 0.0–0.2)

## 2020-05-08 LAB — BASIC METABOLIC PANEL
Anion gap: 6 (ref 5–15)
BUN: 22 mg/dL (ref 8–23)
CO2: 27 mmol/L (ref 22–32)
Calcium: 8.5 mg/dL — ABNORMAL LOW (ref 8.9–10.3)
Chloride: 100 mmol/L (ref 98–111)
Creatinine, Ser: 0.74 mg/dL (ref 0.44–1.00)
GFR, Estimated: >60 mL/min (ref >60)
Glucose, Bld: 123 mg/dL — ABNORMAL HIGH (ref 70–99)
Potassium: 4.3 mmol/L (ref 3.5–5.1)
Sodium: 133 mmol/L — ABNORMAL LOW (ref 135–145)

## 2020-05-08 MED ORDER — LIDOCAINE 5 % EX PTCH
1.0000 | MEDICATED_PATCH | CUTANEOUS | Status: DC
  Administered 2020-05-08 – 2020-05-14 (×7): 1 via TRANSDERMAL
  Filled 2020-05-08 (×7): qty 1

## 2020-05-08 MED ORDER — POLYETHYLENE GLYCOL 3350 17 G PO PACK
17.0000 g | PACK | Freq: Every day | ORAL | Status: DC
  Administered 2020-05-08 – 2020-05-14 (×7): 17 g via ORAL
  Filled 2020-05-08 (×7): qty 1

## 2020-05-08 MED ORDER — CLONAZEPAM 1 MG PO TABS
1.0000 mg | ORAL_TABLET | Freq: Two times a day (BID) | ORAL | Status: DC | PRN
  Administered 2020-05-10 – 2020-05-14 (×6): 1 mg via ORAL
  Filled 2020-05-08 (×8): qty 1

## 2020-05-08 NOTE — Care Management Important Message (Signed)
Important Message  Patient Details  Name: Stacy Allen MRN: [id number] Date of Birth: 11-Jan-1941   Medicare Important Message Given:  Yes     Stacy Allen 05/08/2020, 11:02 AM

## 2020-05-08 NOTE — Progress Notes (Signed)
PT Cancellation Note  Patient Details Name: Stacy Allen MRN: [id number] DOB: 05-26-1941   Cancelled Treatment:    Reason Eval/Treat Not Completed: Other (comment) (Pt resting in bed, eyes closed. Declined PT due to fatigue, pain and continued nausea, also declined PT offers to alert RN for potential medication. PT to re-attempt as able.)   Lieutenant Diego PT, DPT 1:39 PM,05/08/20

## 2020-05-08 NOTE — Anesthesia Postprocedure Evaluation (Signed)
Anesthesia Post Note  Patient: Stacy Allen  Procedure(s) Performed: ARTHROPLASTY RIGHT HIP (HEMIARTHROPLASTY) (Right Hip)  Patient location during evaluation: Nursing Unit Anesthesia Type: Spinal Level of consciousness: oriented and awake and alert Pain management: pain level controlled Vital Signs Assessment: post-procedure vital signs reviewed and stable Respiratory status: spontaneous breathing and respiratory function stable Cardiovascular status: blood pressure returned to baseline and stable Postop Assessment: no headache, no backache, no apparent nausea or vomiting and patient able to bend at knees Anesthetic complications: no   No complications documented.   Last Vitals:  Vitals:   05/08/20 0003 05/08/20 0418  BP: 113/74 134/69  Pulse: 74 79  Resp: 17 16  Temp: 36.6 C 36.7 C  SpO2: 93% 97%    Last Pain:  Vitals:   05/08/20 0651  TempSrc:   PainSc: 8     LLE Motor Response: Purposeful movement (05/08/20 0734) LLE Sensation: Full sensation (05/08/20 0734) RLE Motor Response: Purposeful movement (05/08/20 0734) RLE Sensation: Full sensation;Pain (05/08/20 0734)      Caryl Asp

## 2020-05-08 NOTE — Progress Notes (Signed)
PROGRESS NOTE    MIOSHA BEHE  NAT:[id number] DOB: [birth year] DOA: 05/06/2020 PCP: [place]    Brief Narrative:  Stacy Allen is a 79 y.o. female with medical history significant for coronary artery disease, hypertension, dyslipidemia who presents to the ER at the request of her orthopedic surgeon for a right femoral neck fracture   11/12-c/o shoulder pain, xray negative for acute abn.   Consultants:   Orthopedics  Procedures:   Antimicrobials:       Subjective:.  C/o Rt hip pain, shoulder pain b/l.  Patient also complains that her Klonopin is given as needed but she takes it as twice daily standing dose  Objective: Vitals:   05/07/20 2209 05/08/20 0003 05/08/20 0418 05/08/20 0748  BP: 120/78 113/74 134/69 133/70  Pulse: 79 74 79 77  Resp: 16 17 16 15   Temp: 97.9 F (36.6 C) 97.9 F (36.6 C) 98 F (36.7 C) 98.6 F (37 C)  TempSrc:    Oral  SpO2: 97% 93% 97% 95%  Weight:      Height:        Intake/Output Summary (Last 24 hours) at 05/08/2020 0924 Last data filed at 05/08/2020 0600 Gross per 24 hour  Intake 1475 ml  Output 1600 ml  Net -125 ml   Filed Weights   05/06/20 1129 05/06/20 2257  Weight: 102.1 kg 104.3 kg    Examination:  NAD, calm  CTA, no wheeze rales rhonchi's Regular S1-S2 no murmurs Soft nontender nondistended positive bowel sounds No edema Alert oriented x3 Mood and affect appropriate in current setting    Data Reviewed: I have personally reviewed following labs and imaging studies  CBC: Recent Labs  Lab 05/06/20 1131 05/07/20 0509 05/08/20 0623  WBC 7.2 5.7 9.3  HGB 12.0 11.5* 9.3*  HCT 37.7 35.7* 28.8*  MCV 92.9 91.1 92.0  PLT 333 258 427   Basic Metabolic Panel: Recent Labs  Lab 05/06/20 1131 05/07/20 0509 05/08/20 0623  NA 134* 136 133*  K 4.0 4.0 4.3  CL 97* 102 100  CO2 27 26 27   GLUCOSE 141* 96 123*  BUN 15 14 22   CREATININE 0.62 0.70 0.74  CALCIUM 8.6* 8.7* 8.5*    GFR: Estimated Creatinine Clearance: 68.1 mL/min (by C-G formula based on SCr of 0.74 mg/dL). Liver Function Tests: No results for input(s): AST, ALT, ALKPHOS, BILITOT, PROT, ALBUMIN in the last 168 hours. No results for input(s): LIPASE, AMYLASE in the last 168 hours. No results for input(s): AMMONIA in the last 168 hours. Coagulation Profile: Recent Labs  Lab 05/06/20 1131  INR 1.0   Cardiac Enzymes: No results for input(s): CKTOTAL, CKMB, CKMBINDEX, TROPONINI in the last 168 hours. BNP (last 3 results) No results for input(s): PROBNP in the last 8760 hours. HbA1C: No results for input(s): HGBA1C in the last 72 hours. CBG: No results for input(s): GLUCAP in the last 168 hours. Lipid Profile: No results for input(s): CHOL, HDL, LDLCALC, TRIG, CHOLHDL, LDLDIRECT in the last 72 hours. Thyroid Function Tests: No results for input(s): TSH, T4TOTAL, FREET4, T3FREE, THYROIDAB in the last 72 hours. Anemia Panel: No results for input(s): VITAMINB12, FOLATE, FERRITIN, TIBC, IRON, RETICCTPCT in the last 72 hours. Sepsis Labs: No results for input(s): PROCALCITON, LATICACIDVEN in the last 168 hours.  Recent Results (from the past 240 hour(s))  Respiratory Panel by RT PCR (Flu A&B, Covid) - Nasopharyngeal Swab     Status: None   Collection Time: 05/06/20 12:43 PM  Specimen: Nasopharyngeal Swab  Result Value Ref Range Status   SARS Coronavirus 2 by RT PCR NEGATIVE NEGATIVE Final    Comment: (NOTE) SARS-CoV-2 target nucleic acids are NOT DETECTED.  The SARS-CoV-2 RNA is generally detectable in upper respiratoy specimens during the acute phase of infection. The lowest concentration of SARS-CoV-2 viral copies this assay can detect is 131 copies/mL. A negative result does not preclude SARS-Cov-2 infection and should not be used as the sole basis for treatment or other patient management decisions. A negative result may occur with  improper specimen collection/handling, submission of  specimen other than nasopharyngeal swab, presence of viral mutation(s) within the areas targeted by this assay, and inadequate number of viral copies (<131 copies/mL). A negative result must be combined with clinical observations, patient history, and epidemiological information. The expected result is Negative.  Fact Sheet for Patients:  PinkCheek.be  Fact Sheet for Healthcare Providers:  GravelBags.it  This test is no t yet approved or cleared by the [place] FDA and  has been authorized for detection and/or diagnosis of SARS-CoV-2 by FDA under an Emergency Use Authorization (EUA). This EUA will remain  in effect (meaning this test can be used) for the duration of the COVID-19 declaration under Section 564(b)(1) of the Act, 21 U.S.C. section 360bbb-3(b)(1), unless the authorization is terminated or revoked sooner.     Influenza A by PCR NEGATIVE NEGATIVE Final   Influenza B by PCR NEGATIVE NEGATIVE Final    Comment: (NOTE) The Xpert Xpress SARS-CoV-2/FLU/RSV assay is intended as an aid in  the diagnosis of influenza from Nasopharyngeal swab specimens and  should not be used as a sole basis for treatment. Nasal washings and  aspirates are unacceptable for Xpert Xpress SARS-CoV-2/FLU/RSV  testing.  Fact Sheet for Patients: PinkCheek.be  Fact Sheet for Healthcare Providers: GravelBags.it  This test is not yet approved or cleared by the [place] FDA and  has been authorized for detection and/or diagnosis of SARS-CoV-2 by  FDA under an Emergency Use Authorization (EUA). This EUA will remain  in effect (meaning this test can be used) for the duration of the  Covid-19 declaration under Section 564(b)(1) of the Act, 21  U.S.C. section 360bbb-3(b)(1), unless the authorization is  terminated or revoked. Performed at [facility], [address], [place], [place] [address]          Radiology Studies: DG Shoulder Right Port  Result Date: 05/07/2020 CLINICAL DATA:  79 year old female with right shoulder pain. EXAM: PORTABLE RIGHT SHOULDER COMPARISON:  Chest radiograph dated 02/17/2019. FINDINGS: There is no acute fracture or dislocation. No significant arthritic changes. There is mild degenerative changes of the [facility] joint with spurring. The soft tissues are unremarkable. A wire over the right axilla, likely superimposed on the patient. IMPRESSION: 1. No acute fracture or dislocation. 2. Mild degenerative changes of the [facility] joint. Electronically Signed   By: Anner Crete M.D.   On: 05/07/2020 18:34   DG HIP PORT UNILAT WITH PELVIS 1V RIGHT  Result Date: 05/07/2020 CLINICAL DATA:  Status post hip replacement EXAM: DG HIP (WITH OR WITHOUT PELVIS) 1V PORT RIGHT COMPARISON:  05/05/2020 FINDINGS: Status post right hip hemiarthroplasty with intact hardware and grossly normal alignment. Mild gas in the soft tissues consistent with recent surgery. IMPRESSION: Status post right hip hemiarthroplasty with expected postsurgical change Electronically Signed   By: Donavan Foil M.D.   On: 05/07/2020 18:33        Scheduled Meds: . acetaminophen  1,000  mg Oral Q6H  . Chlorhexidine Gluconate Cloth  6 each Topical Daily  . citalopram  40 mg Oral Daily  . docusate sodium  100 mg Oral BID  . enalapril  40 mg Oral Daily  . enoxaparin (LOVENOX) injection  0.5 mg/kg Subcutaneous Q24H  . ferrous sulfate  325 mg Oral BID  . fluticasone  2 spray Each Nare Daily  . gabapentin  600 mg Oral TID  . levothyroxine  137 mcg Oral QAC breakfast  . loratadine  10 mg Oral Daily  . metoprolol tartrate  12.5 mg Oral BID  . mirtazapine  15 mg Oral QHS  . pantoprazole  40 mg Oral Daily  . polyvinyl alcohol   Both Eyes Daily  . Ensure Max Protein  11 oz Oral Daily  . senna  1 tablet Oral BID  . [START ON 05/26/2020] Vitamin D (Ergocalciferol)  50,000 Units  Oral Q30 days   Continuous Infusions: . sodium chloride 75 mL/hr (05/08/20 0600)  . lactated ringers 10 mL/hr at 05/07/20 1338    Assessment & Plan:   Principal Problem:   Closed right hip fracture [facility]) Active Problems:   Chronic pain syndrome   Essential hypertension   Hypothyroidism   Closed right hip fracture-Status post fall which appears to be mechanical Post right hip hemiarthroplasty by Dr. Mack Guise on 11/11 Pain control Bowel regimen PT rec. SNF   Hypertension Controlled  Continue beta-blockers and ACE inhibitors     Depression and anxiety Continue citalopram  Change Klonopin to twice daily dosing instead of as needed as patient takes this at home      Hypothyroidism Continue Synthroid   GERD Continue Protonix    DVT prophylaxis: lovenox Code Status: Full Family Communication: None at bedside  Status is: Inpatient  Remains inpatient appropriate because: Unsafe discharge  Dispo: The patient is from: Home              Anticipated d/c is to: TBD              Anticipated d/c date is: 2 days              Patient currently is not medically stable to d/c.  Pain control. Needs snf           LOS: 2 days   Time spent: 35 minutes with more than 50% on [place], MD [facility] Pager [phone]  If 7PM-7AM, please contact night-coverage [email or web address] Password [facility] 05/08/2020, 9:24 AM

## 2020-05-08 NOTE — Progress Notes (Signed)
Refused mobility x 4 this shift

## 2020-05-08 NOTE — Evaluation (Signed)
Physical Therapy Evaluation Patient Details Name: Stacy Allen MRN: [id number] DOB: 07-29-1940 Today's Date: 05/08/2020   History of Present Illness  Pt is a 79 y.o. female with medical history significant for coronary artery disease, hypertension, fibromyalgia, dyslipidemia who presents to the ER at the request of her orthopedic surgeon for a right femoral neck fracture, underwent R hip hemiarthroplasty.    Clinical Impression  Pt alert, very verbose throughout session, did express significant pain with RLE, RN in room at start of session administering medication. Pt also complaining of bilateral R and L shoulder pain that started prior to surgery but has worsened during her stay, pt stated they have taken imaging of these areas and found nothing significant.  The patient was very limited due to increased pain during the session. Pt educated on PLB and pain management techniques with poor carryover. Unable to move RLE against gravity without assistance. Several supine exercises performed, PROM for heel slides on RLE. Supine <> sit with mod-maxA and extended time due to pain. She was able to sit ~7 minutes with bilateral UE support. AAROM LAQ x5 performed at EOB. Unable to weight shift any weight on to R side, and adamantly declined further mobility due to pain and nausea. Pt returned to supine and extended time needed to assist pt with repositioning for comfort, and abduction pillow replaced.  Overall the patient demonstrated deficits (see "PT Problem List") that impede the patient's functional abilities, safety, and mobility and would benefit from skilled PT intervention. Recommendation is SNF due to current level of assistance needed and to maximize function, mobility, and safety.     Follow Up Recommendations SNF    Equipment Recommendations  Other (comment) (TBD at next level of care)    Recommendations for Other Services       Precautions / Restrictions Precautions Precautions:  Posterior Hip Precaution Booklet Issued: No Restrictions Weight Bearing Restrictions: Yes RLE Weight Bearing: Weight bearing as tolerated      Mobility  Bed Mobility Overal bed mobility: Needs Assistance Bed Mobility: Supine to Sit;Sit to Supine     Supine to sit: HOB elevated;Max assist Sit to supine: HOB elevated;Max assist   General bed mobility comments: MaxA for LE assist, as well as trunk assist to come into sitting    Transfers                 General transfer comment: pt declined further mobility  Ambulation/Gait                Stairs            Wheelchair Mobility    Modified Rankin (Stroke Patients Only)       Balance Overall balance assessment: Needs assistance Sitting-balance support: Feet supported;Bilateral upper extremity supported Sitting balance-Leahy Scale: Poor Sitting balance - Comments: Pt unwilling to perform any weight shift to the R due to increased pain                                     Pertinent Vitals/Pain Pain Assessment: Faces Faces Pain Scale: Hurts worst Pain Location: R hip Pain Descriptors / Indicators: Aching;Grimacing;Moaning;Guarding Pain Intervention(s): Limited activity within patient's tolerance;Monitored during session;Repositioned;Premedicated before session    Home Living Family/patient expects to be discharged to:: Private residence Living Arrangements: Alone                    Prior Function  Comments: Son was assisting with patients mobility and RLE movement     Hand Dominance        Extremity/Trunk Assessment   Upper Extremity Assessment Upper Extremity Assessment: Overall WFL for tasks assessed (Pt reported acute R and L shoulder pain ,as well as neck pain)    Lower Extremity Assessment Lower Extremity Assessment: RLE deficits/detail;LLE deficits/detail RLE Deficits / Details: unable to lift against gravity without assistance LLE Deficits /  Details: WFLs    Cervical / Trunk Assessment Cervical / Trunk Assessment: Kyphotic  Communication   Communication: Deaf  Cognition Arousal/Alertness: Awake/alert Behavior During Therapy: WFL for tasks assessed/performed Overall Cognitive Status: Within Functional Limits for tasks assessed                                        General Comments      Exercises Total Joint Exercises Ankle Circles/Pumps: AROM;Strengthening;Both;10 reps Quad Sets: AROM;Strengthening;Both;10 reps Gluteal Sets: AROM;Strengthening;Both;10 reps Heel Slides: PROM;Right;10 reps;AROM;Left;Strengthening Long Arc Quad: AAROM;Strengthening;Right;5 reps   Assessment/Plan    PT Assessment Patient needs continued PT services  PT Problem List Decreased strength;Decreased mobility;Decreased range of motion;Decreased knowledge of precautions;Decreased activity tolerance;Decreased balance;Decreased knowledge of use of DME;Pain       PT Treatment Interventions DME instruction;Therapeutic exercise;Gait training;Balance training;Stair training;Neuromuscular re-education;Functional mobility training;Therapeutic activities;Patient/family education    PT Goals (Current goals can be found in the Care Plan section)  Acute Rehab PT Goals Patient Stated Goal: to have less pain PT Goal Formulation: With patient Time For Goal Achievement: 05/22/20 Potential to Achieve Goals: Good    Frequency BID   Barriers to discharge Inaccessible home environment;Decreased caregiver support      Co-evaluation               AM-PAC PT "6 Clicks" Mobility  Outcome Measure Help needed turning from your back to your side while in a flat bed without using bedrails?: A Lot Help needed moving from lying on your back to sitting on the side of a flat bed without using bedrails?: A Lot Help needed moving to and from a bed to a chair (including a wheelchair)?: Total Help needed standing up from a chair using your arms  (e.g., wheelchair or bedside chair)?: Total Help needed to walk in hospital room?: Total Help needed climbing 3-5 steps with a railing? : Total 6 Click Score: 8    End of Session Equipment Utilized During Treatment: Gait belt Activity Tolerance: Patient limited by fatigue;Patient limited by pain Patient left: in bed;with call bell/phone within reach;with bed alarm set;with SCD's reapplied Nurse Communication: Mobility status PT Visit Diagnosis: Other abnormalities of gait and mobility (R26.89);Muscle weakness (generalized) (M62.81);Difficulty in walking, not elsewhere classified (R26.2)    Time: 9311-2162 PT Time Calculation (min) (ACUTE ONLY): 40 min   Charges:   PT Evaluation $PT Eval Low Complexity: 1 Low PT Treatments $Therapeutic Exercise: 23-37 mins        Lieutenant Diego PT, DPT 11:31 AM,05/08/20

## 2020-05-08 NOTE — Progress Notes (Signed)
PT Cancellation Note  Patient Details Name: Stacy Allen MRN: [id number] DOB: 1941-05-15   Cancelled Treatment:    Reason Eval/Treat Not Completed: Other (comment) (Patient checked on again this PM, was eating a muffin. Declined PT, that she did not feel well and she has too much pain to try again today. PT to re-attempt as able.)   Lieutenant Diego PT, DPT 3:39 PM,05/08/20

## 2020-05-08 NOTE — Progress Notes (Signed)
Subjective:  POD #1 s/p right hip hemiarthroplasty.   Patient reports right hip pain as mild to moderate.  Patient states she just finished breakfast when physical therapy arrived this morning and she got sick to her stomach.  Has not been able to get out of bed today.  Patient is complaining of right shoulder pain .  Objective:   VITALS:   Vitals:   05/08/20 0003 05/08/20 0418 05/08/20 0748 05/08/20 1150  BP: 113/74 134/69 133/70 (!) 141/78  Pulse: 74 79 77 86  Resp: 17 16 15 16   Temp: 97.9 F (36.6 C) 98 F (36.7 C) 98.6 F (37 C) 98.8 F (37.1 C)  TempSrc:   Oral Oral  SpO2: 93% 97% 95% 96%  Weight:      Height:        PHYSICAL EXAM: Right lower extremity: Neurovascular intact Sensation intact distally Intact pulses distally Dorsiflexion/Plantar flexion intact Incision: scant drainage No cellulitis present Compartment soft  Right upper extremity: Patient can actively forward elevate and abduct to approximately 80 degrees.  She has no obvious weakness of the rotator cuff.  She has pain with impingement testing and is neurovascular intact.  LABS  Results for orders placed or performed during the hospital encounter of 05/06/20 (from the past 24 hour(s))  CBC     Status: Abnormal   Collection Time: 05/08/20  6:23 AM  Result Value Ref Range   WBC 9.3 4.0 - 10.5 K/uL   RBC 3.13 (L) 3.87 - 5.11 MIL/uL   Hemoglobin 9.3 (L) 12.0 - 15.0 g/dL   HCT 28.8 (L) 36 - 46 %   MCV 92.0 80.0 - 100.0 fL   MCH 29.7 26.0 - 34.0 pg   MCHC 32.3 30.0 - 36.0 g/dL   RDW 15.6 (H) 11.5 - 15.5 %   Platelets 238 150 - 400 K/uL   nRBC 0.0 0.0 - 0.2 %  Basic metabolic panel     Status: Abnormal   Collection Time: 05/08/20  6:23 AM  Result Value Ref Range   Sodium 133 (L) 135 - 145 mmol/L   Potassium 4.3 3.5 - 5.1 mmol/L   Chloride 100 98 - 111 mmol/L   CO2 27 22 - 32 mmol/L   Glucose, Bld 123 (H) 70 - 99 mg/dL   BUN 22 8 - 23 mg/dL   Creatinine, Ser 0.74 0.44 - 1.00 mg/dL   Calcium  8.5 (L) 8.9 - 10.3 mg/dL   GFR, Estimated >60 >60 mL/min   Anion gap 6 5 - 15    DG Shoulder Right Port  Result Date: 05/07/2020 CLINICAL DATA:  79 year old female with right shoulder pain. EXAM: PORTABLE RIGHT SHOULDER COMPARISON:  Chest radiograph dated 02/17/2019. FINDINGS: There is no acute fracture or dislocation. No significant arthritic changes. There is mild degenerative changes of the [facility] joint with spurring. The soft tissues are unremarkable. A wire over the right axilla, likely superimposed on the patient. IMPRESSION: 1. No acute fracture or dislocation. 2. Mild degenerative changes of the [facility] joint. Electronically Signed   By: Anner Crete M.D.   On: 05/07/2020 18:34   DG HIP PORT UNILAT WITH PELVIS 1V RIGHT  Result Date: 05/07/2020 CLINICAL DATA:  Status post hip replacement EXAM: DG HIP (WITH OR WITHOUT PELVIS) 1V PORT RIGHT COMPARISON:  05/05/2020 FINDINGS: Status post right hip hemiarthroplasty with intact hardware and grossly normal alignment. Mild gas in the soft tissues consistent with recent surgery. IMPRESSION: Status post right hip hemiarthroplasty with expected postsurgical change Electronically  Signed   By: Donavan Foil M.D.   On: 05/07/2020 18:33    Assessment/Plan: 1 Day Post-Op   Principal Problem:   Closed right hip fracture (HCC) Active Problems:   Chronic pain syndrome   Essential hypertension   Hypothyroidism  Patient is stable from an orthopedic standpoint on postop day #1.  Continue physical therapy as the patient tolerates.  She is weightbearing as tolerated on the right lower extremity.  She should observe posterior hip precautions and should use her abduction pillow while in bed.  X-rays of the right shoulder last night demonstrated no evidence of fracture of the right shoulder.  Patient is complaining of pain in the right shoulder and states she uses lidocaine patches for this at home.  Patient may have shoulder impingement versus rotator cuff  pathology given her recent falls.  Continue current pain management for both right hip and right shoulder pain.  Patient should receive Lovenox for DVT prophylaxis.  [name] Lovenox has been weight adjusted by the pharmacy.    Thornton Park , MD 05/08/2020, 12:54 PM

## 2020-05-08 NOTE — Evaluation (Signed)
Occupational Therapy Evaluation Patient Details Name: Stacy Allen MRN: [id number] DOB: 05-Mar-1941 Today's Date: 05/08/2020    History of Present Illness Pt is a 79 y.o. female with medical history significant for coronary artery disease, hypertension, fibromyalgia, dyslipidemia who presents to the ER at the request of her orthopedic surgeon for a right femoral neck fracture, underwent R hip hemiarthroplasty.   Clinical Impression   Patient presenting with decreased I in self care, balance, functional mobility/transfers, endurance, and safety awareness. Patient reports being mod I with use rollator PTA. Pt reports living in camper near family member. Camper has built on ramp and deck. Pt reports she is able to utilize rollator within and sits in seat when cooking and washing dishes. Patient currently refusing OOB mobility and self care. OT providing education regarding self care tasks and need for AE to maintain hip precautions. Bed mobility requiring max A. Patient will benefit from acute OT to increase overall independence in the areas of ADLs, functional mobility, and safety awareness in order to safely discharge to next venue of care.    Follow Up Recommendations  SNF    Equipment Recommendations  None recommended by OT       Precautions / Restrictions Precautions Precautions: Posterior Hip Precaution Booklet Issued: No      Mobility Bed Mobility Overal bed mobility: Needs Assistance Bed Mobility: Rolling Rolling: Max assist         General bed mobility comments: max A to reposition    Transfers     General transfer comment: pt declined further mobility        ADL either performed or assessed with clinical judgement   ADL Overall ADL's : Needs assistance/impaired     Grooming: Wash/dry hands;Wash/dry face;Oral care;Sitting;Set up       Lower Body Bathing: Moderate assistance;Maximal assistance       Lower Body Dressing: Total assistance            Vision Baseline Vision/History: Wears glasses Wears Glasses: Reading only Patient Visual Report: No change from baseline              Pertinent Vitals/Pain Pain Assessment: No/denies pain Faces Pain Scale: Hurts worst Pain Location: R hip Pain Descriptors / Indicators: Aching;Grimacing;Moaning;Guarding Pain Intervention(s): Limited activity within patient's tolerance;Monitored during session;Premedicated before session;Repositioned     Hand Dominance Right   Extremity/Trunk Assessment Upper Extremity Assessment Upper Extremity Assessment: Overall WFL for tasks assessed   Lower Extremity Assessment Lower Extremity Assessment: RLE deficits/detail;LLE deficits/detail RLE Deficits / Details: unable to lift against gravity without assistance   Cervical / Trunk Assessment Cervical / Trunk Assessment: Kyphotic   Communication Communication Communication: No difficulties   Cognition Arousal/Alertness: Awake/alert Behavior During Therapy: WFL for tasks assessed/performed Overall Cognitive Status: Within Functional Limits for tasks assessed                     Home Living Family/patient expects to be discharged to:: Other (Comment) (camper) Living Arrangements: Alone Available Help at Discharge: Family;Available PRN/intermittently Type of Home: Other(Comment) (camper) Home Access: Ramped entrance   Entrance Stairs-Rails: Can reach both Home Layout: One level;Other (Comment) (camper)     Bathroom Shower/Tub: Teacher, early years/pre: Standard     Home Equipment: Bedside commode;Walker - 4 wheels          Prior Functioning/Environment Level of Independence: Independent with assistive device(s)        Comments: Pt reports living in camper next to family members. It  has a ramp and deck built on. Pt reports being able to mobility within camper with rollator.        OT Problem List: Decreased strength;Decreased safety awareness;Decreased  activity tolerance;Decreased knowledge of use of DME or AE;Impaired balance (sitting and/or standing);Decreased knowledge of precautions;Pain      OT Treatment/Interventions: Self-care/ADL training;Therapeutic exercise;Therapeutic activities;Energy conservation;Neuromuscular education;Patient/family education;DME and/or AE instruction;Balance training    OT Goals(Current goals can be found in the care plan section) Acute Rehab OT Goals Patient Stated Goal: to have less pain OT Goal Formulation: With patient Time For Goal Achievement: 05/22/20 Potential to Achieve Goals: Good ADL Goals Pt Will Perform Grooming: with min assist;standing Pt Will Transfer to Toilet: with min assist;ambulating Pt Will Perform Toileting - Clothing Manipulation and hygiene: with min assist;sit to/from stand  OT Frequency: Min 1X/week   Barriers to D/C:    none known at this time          AM-PAC OT "6 Clicks" Daily Activity     Outcome Measure Help from another person eating meals?: None Help from another person taking care of personal grooming?: A Little Help from another person toileting, which includes using toliet, bedpan, or urinal?: Total Help from another person bathing (including washing, rinsing, drying)?: A Lot Help from another person to put on and taking off regular upper body clothing?: A Little Help from another person to put on and taking off regular lower body clothing?: Total 6 Click Score: 14   End of Session    Activity Tolerance: Patient limited by pain Patient left: in bed;with bed alarm set  OT Visit Diagnosis: Muscle weakness (generalized) (M62.81);Pain Pain - Right/Left: Left Pain - part of body: Hip                Time: 5643-3295 OT Time Calculation (min): 24 min Charges:  OT General Charges $OT Visit: 1 Visit OT Evaluation $OT Eval Low Complexity: 1 Low OT Treatments $Self Care/Home Management : 8-22 mins

## 2020-05-09 DIAGNOSIS — E039 Hypothyroidism, unspecified: Secondary | ICD-10-CM | POA: Diagnosis not present

## 2020-05-09 DIAGNOSIS — D72829 Elevated white blood cell count, unspecified: Secondary | ICD-10-CM

## 2020-05-09 DIAGNOSIS — I10 Essential (primary) hypertension: Secondary | ICD-10-CM | POA: Diagnosis not present

## 2020-05-09 DIAGNOSIS — S72001K Fracture of unspecified part of neck of right femur, subsequent encounter for closed fracture with nonunion: Secondary | ICD-10-CM | POA: Diagnosis not present

## 2020-05-09 DIAGNOSIS — G894 Chronic pain syndrome: Secondary | ICD-10-CM | POA: Diagnosis not present

## 2020-05-09 DIAGNOSIS — E871 Hypo-osmolality and hyponatremia: Secondary | ICD-10-CM

## 2020-05-09 LAB — BASIC METABOLIC PANEL
Anion gap: 6 (ref 5–15)
BUN: 45 mg/dL — ABNORMAL HIGH (ref 8–23)
CO2: 25 mmol/L (ref 22–32)
Calcium: 8.3 mg/dL — ABNORMAL LOW (ref 8.9–10.3)
Chloride: 99 mmol/L (ref 98–111)
Creatinine, Ser: 0.89 mg/dL (ref 0.44–1.00)
GFR, Estimated: >60 mL/min (ref >60)
Glucose, Bld: 108 mg/dL — ABNORMAL HIGH (ref 70–99)
Potassium: 3.9 mmol/L (ref 3.5–5.1)
Sodium: 130 mmol/L — ABNORMAL LOW (ref 135–145)

## 2020-05-09 LAB — CBC
HCT: 25.5 % — ABNORMAL LOW (ref 36.0–46.0)
Hemoglobin: 8.4 g/dL — ABNORMAL LOW (ref 12.0–15.0)
MCH: 29.7 pg (ref 26.0–34.0)
MCHC: 32.9 g/dL (ref 30.0–36.0)
MCV: 90.1 fL (ref 80.0–100.0)
Platelets: 236 10*3/uL (ref 150–400)
RBC: 2.83 MIL/uL — ABNORMAL LOW (ref 3.87–5.11)
RDW: 15.8 % — ABNORMAL HIGH (ref 11.5–15.5)
WBC: 15.4 10*3/uL — ABNORMAL HIGH (ref 4.0–10.5)
nRBC: 0 % (ref 0.0–0.2)

## 2020-05-09 MED ORDER — FLEET ENEMA 7-19 GM/118ML RE ENEM
1.0000 | ENEMA | Freq: Once | RECTAL | Status: AC
  Administered 2020-05-09: 1 via RECTAL

## 2020-05-09 MED ORDER — ENALAPRIL MALEATE 20 MG PO TABS
20.0000 mg | ORAL_TABLET | Freq: Every day | ORAL | Status: DC
  Administered 2020-05-10: 20 mg via ORAL
  Filled 2020-05-09 (×2): qty 1

## 2020-05-09 MED ORDER — SODIUM CHLORIDE 0.9 % IV SOLN: INTRAVENOUS | Status: DC

## 2020-05-09 MED ORDER — FLEET ENEMA 7-19 GM/118ML RE ENEM: 1.0000 | ENEMA | Freq: Every day | RECTAL | Status: DC | PRN

## 2020-05-09 NOTE — Progress Notes (Signed)
PROGRESS NOTE    Stacy Allen  QVZ:[id number] DOB: Jan 19, 1941 DOA: 05/06/2020 PCP: [place]    Brief Narrative:  Stacy Allen is a 79 y.o. female with medical history significant for coronary artery disease, hypertension, dyslipidemia who presents to the ER at the request of her orthopedic surgeon for a right femoral neck fracture   11/12-c/o shoulder pain, xray negative for acute abn. 11/13- was getting in and out cath due to urine residual per nsg 216 on scan. Pt reports no BM so far.   Consultants:   Orthopedics  Procedures:   Antimicrobials:       Subjective:.  Patient is annoyed being left in the chair for almost 2 hours as she does not want to be sitting in the chair and wants to lay down. No BM so far. No other complaints. Right shoulder pain improving  Objective: Vitals:   05/08/20 1607 05/08/20 1955 05/09/20 0431 05/09/20 0751  BP: (!) 97/52 (!) 106/51 132/63 137/69  Pulse: 91 92 93 99  Resp: 16  17 20   Temp: 99.1 F (37.3 C) 98.3 F (36.8 C) 98.7 F (37.1 C) 99.5 F (37.5 C)  TempSrc:   Oral Oral  SpO2: 96% 96% 94% 98%  Weight:      Height:        Intake/Output Summary (Last 24 hours) at 05/09/2020 3295 Last data filed at 05/09/2020 0620 Gross per 24 hour  Intake 687.5 ml  Output 450 ml  Net 237.5 ml   Filed Weights   05/06/20 1129 05/06/20 2257  Weight: 102.1 kg 104.3 kg    Examination: NAD, mildly in foul mood while sitting in chair CTA, no wheeze rales rhonchi's Regular S1-S2 no murmurs Soft benign positive bowel sounds No edema Alert oriented x3, grossly intact Mood and affect appropriate in current setting   Data Reviewed: I have personally reviewed following labs and imaging studies  CBC: Recent Labs  Lab 05/06/20 1131 05/07/20 0509 05/08/20 0623 05/09/20 0733  WBC 7.2 5.7 9.3 15.4*  HGB 12.0 11.5* 9.3* 8.4*  HCT 37.7 35.7* 28.8* 25.5*  MCV 92.9 91.1 92.0 90.1  PLT 333 258 238 188   Basic  Metabolic Panel: Recent Labs  Lab 05/06/20 1131 05/07/20 0509 05/08/20 0623 05/09/20 0555  NA 134* 136 133* 130*  K 4.0 4.0 4.3 3.9  CL 97* 102 100 99  CO2 27 26 27 25   GLUCOSE 141* 96 123* 108*  BUN 15 14 22  45*  CREATININE 0.62 0.70 0.74 0.89  CALCIUM 8.6* 8.7* 8.5* 8.3*   GFR: Estimated Creatinine Clearance: 61.2 mL/min (by C-G formula based on SCr of 0.89 mg/dL). Liver Function Tests: No results for input(s): AST, ALT, ALKPHOS, BILITOT, PROT, ALBUMIN in the last 168 hours. No results for input(s): LIPASE, AMYLASE in the last 168 hours. No results for input(s): AMMONIA in the last 168 hours. Coagulation Profile: Recent Labs  Lab 05/06/20 1131  INR 1.0   Cardiac Enzymes: No results for input(s): CKTOTAL, CKMB, CKMBINDEX, TROPONINI in the last 168 hours. BNP (last 3 results) No results for input(s): PROBNP in the last 8760 hours. HbA1C: No results for input(s): HGBA1C in the last 72 hours. CBG: No results for input(s): GLUCAP in the last 168 hours. Lipid Profile: No results for input(s): CHOL, HDL, LDLCALC, TRIG, CHOLHDL, LDLDIRECT in the last 72 hours. Thyroid Function Tests: No results for input(s): TSH, T4TOTAL, FREET4, T3FREE, THYROIDAB in the last 72 hours. Anemia Panel: No results for input(s): VITAMINB12,  FOLATE, FERRITIN, TIBC, IRON, RETICCTPCT in the last 72 hours. Sepsis Labs: No results for input(s): PROCALCITON, LATICACIDVEN in the last 168 hours.  Recent Results (from the past 240 hour(s))  Respiratory Panel by RT PCR (Flu A&B, Covid) - Nasopharyngeal Swab     Status: None   Collection Time: 05/06/20 12:43 PM   Specimen: Nasopharyngeal Swab  Result Value Ref Range Status   SARS Coronavirus 2 by RT PCR NEGATIVE NEGATIVE Final    Comment: (NOTE) SARS-CoV-2 target nucleic acids are NOT DETECTED.  The SARS-CoV-2 RNA is generally detectable in upper respiratoy specimens during the acute phase of infection. The lowest concentration of SARS-CoV-2 viral  copies this assay can detect is 131 copies/mL. A negative result does not preclude SARS-Cov-2 infection and should not be used as the sole basis for treatment or other patient management decisions. A negative result may occur with  improper specimen collection/handling, submission of specimen other than nasopharyngeal swab, presence of viral mutation(s) within the areas targeted by this assay, and inadequate number of viral copies (<131 copies/mL). A negative result must be combined with clinical observations, patient history, and epidemiological information. The expected result is Negative.  Fact Sheet for Patients:  PinkCheek.be  Fact Sheet for Healthcare Providers:  GravelBags.it  This test is no t yet approved or cleared by the [place] FDA and  has been authorized for detection and/or diagnosis of SARS-CoV-2 by FDA under an Emergency Use Authorization (EUA). This EUA will remain  in effect (meaning this test can be used) for the duration of the COVID-19 declaration under Section 564(b)(1) of the Act, 21 U.S.C. section 360bbb-3(b)(1), unless the authorization is terminated or revoked sooner.     Influenza A by PCR NEGATIVE NEGATIVE Final   Influenza B by PCR NEGATIVE NEGATIVE Final    Comment: (NOTE) The Xpert Xpress SARS-CoV-2/FLU/RSV assay is intended as an aid in  the diagnosis of influenza from Nasopharyngeal swab specimens and  should not be used as a sole basis for treatment. Nasal washings and  aspirates are unacceptable for Xpert Xpress SARS-CoV-2/FLU/RSV  testing.  Fact Sheet for Patients: PinkCheek.be  Fact Sheet for Healthcare Providers: GravelBags.it  This test is not yet approved or cleared by the [place] FDA and  has been authorized for detection and/or diagnosis of SARS-CoV-2 by  FDA under an Emergency Use Authorization (EUA). This  EUA will remain  in effect (meaning this test can be used) for the duration of the  Covid-19 declaration under Section 564(b)(1) of the Act, 21  U.S.C. section 360bbb-3(b)(1), unless the authorization is  terminated or revoked. Performed at [facility], [address], [place], [place] [address]          Radiology Studies: DG Shoulder Right Port  Result Date: 05/07/2020 CLINICAL DATA:  79 year old female with right shoulder pain. EXAM: PORTABLE RIGHT SHOULDER COMPARISON:  Chest radiograph dated 02/17/2019. FINDINGS: There is no acute fracture or dislocation. No significant arthritic changes. There is mild degenerative changes of the [facility] joint with spurring. The soft tissues are unremarkable. A wire over the right axilla, likely superimposed on the patient. IMPRESSION: 1. No acute fracture or dislocation. 2. Mild degenerative changes of the [facility] joint. Electronically Signed   By: Anner Crete M.D.   On: 05/07/2020 18:34   DG HIP PORT UNILAT WITH PELVIS 1V RIGHT  Result Date: 05/07/2020 CLINICAL DATA:  Status post hip replacement EXAM: DG HIP (WITH OR WITHOUT PELVIS) 1V PORT RIGHT COMPARISON:  05/05/2020 FINDINGS: Status  post right hip hemiarthroplasty with intact hardware and grossly normal alignment. Mild gas in the soft tissues consistent with recent surgery. IMPRESSION: Status post right hip hemiarthroplasty with expected postsurgical change Electronically Signed   By: Donavan Foil M.D.   On: 05/07/2020 18:33        Scheduled Meds: . Chlorhexidine Gluconate Cloth  6 each Topical Daily  . citalopram  40 mg Oral Daily  . docusate sodium  100 mg Oral BID  . enalapril  40 mg Oral Daily  . enoxaparin (LOVENOX) injection  0.5 mg/kg Subcutaneous Q24H  . ferrous sulfate  325 mg Oral BID  . fluticasone  2 spray Each Nare Daily  . gabapentin  600 mg Oral TID  . levothyroxine  137 mcg Oral QAC breakfast  . lidocaine  1 patch Transdermal Q24H  . loratadine  10 mg Oral  Daily  . metoprolol tartrate  12.5 mg Oral BID  . mirtazapine  15 mg Oral QHS  . pantoprazole  40 mg Oral Daily  . polyethylene glycol  17 g Oral Daily  . polyvinyl alcohol   Both Eyes Daily  . Ensure Max Protein  11 oz Oral Daily  . senna  1 tablet Oral BID  . [START ON 05/26/2020] Vitamin D (Ergocalciferol)  50,000 Units Oral Q30 days   Continuous Infusions: . lactated ringers 10 mL/hr at 05/07/20 1338    Assessment & Plan:   Principal Problem:   Closed right hip fracture [facility]) Active Problems:   Chronic pain syndrome   Essential hypertension   Hypothyroidism   Closed right hip fracture-Status post fall which appears to be mechanical S/p right hip hemiarthroplasty postop #2 by Dr. Gretta Arab on 11/11  Pain control  Bowel regimen, will add Fleet enema today since patient has not had any bowel movements  PT recommends SNF    Hypertensio Bp mildly low, will decrease ACE inhibitor's to 20 mg daily Continue beta-blocker  Leukocytosis-likely reactive/stress induced. Afebrile Will monitor  Hyponatremia- etiology unclear. Due to pain, or hypovolemia as she does appear to be on dry side Will resume ivf gentle hydration Monitor levels    Depression and anxiety Continue citalopram  Change Klonopin to twice daily dosing instead of as needed as patient takes this at home      Hypothyroidism Continue Synthroid   GERD Continue Protonix    DVT prophylaxis: lovenox Code Status: Full Family Communication: None at bedside  Status is: Inpatient  Remains inpatient appropriate because: Unsafe discharge, needs ivf.   Dispo: The patient is from: Home              Anticipated d/c is to: SNF              Anticipated d/c date is: 2 days              Patient currently is not medically stable to d/c.  Needs IVF for hydration, bowel regimen, electrolyte imbalance.        LOS: 3 days   Time spent: 45 minutes with more than 50% on [place],  MD [facility] Pager [phone]  If 7PM-7AM, please contact night-coverage [email or web address] Password [facility] 05/09/2020, 8:32 AM

## 2020-05-09 NOTE — Progress Notes (Signed)
PT Cancellation Note  Patient Details Name: Stacy Allen MRN: [id number] DOB: [birth year]   Cancelled Treatment:     PT attempt x 2 this afternoon. Pt refused. "I think I over did it this morning and my R arm hurts." Max encouragement for even bed level there ex however pt continued to refused. Acute PT will continue to follow per POC.    Willette Pa 05/09/2020, 2:57 PM

## 2020-05-09 NOTE — TOC Initial Note (Addendum)
Transition of Care [facility]) - Initial/Assessment Note    Patient Details  Name: Stacy Allen MRN: [id number] Date of Birth: 01-14-1941  Transition of [facility]) CM/SW Contact:    Izola Price, RN Phone Number: 05/09/2020, 3:46 PM  Clinical Narrative:  11/30 1630 Attempting FL2 and information verified, but keeps saying does not match. Called patient to verify as well. FL2 incomplete. BBell RN CM  11/13 "Feel better, but so tired. Want to get stronger"              11/13 TOC assessment completed. Pt lived in a modified RV with a Ramp in her niece's backyard PTA. Niece provides transportation. Used a Rollator and was able to perform modest ADL's such as sitting in rollator to wash dishes. Most problems getting OOB to walker according to patient.. Was being seen by [facility] PTA. Permission granted to speak to niece/agencies.  Rx: [id number] [place], [place] PCP: [place] remember name of provider. Prior provider, Dr. Wallene Huh, left practice. No preferences on facilities. Simmie Davies RN CM.   Expected Discharge Plan: Skilled Nursing Facility Barriers to Discharge: Continued Medical Work up   Patient Goals and CMS Choice Patient states their goals for this hospitalization and ongoing recovery are:: To get stronger      Expected Discharge Plan and Services Expected Discharge Plan: [place]   Discharge Planning Services: CM Consult Post Acute Care Choice: [place] Living arrangements for the past 2 months: Single Family Home                                      Prior Living Arrangements/Services Living arrangements for the past 2 months: Single Family Home Lives with:: Adult Children Patient language and need for interpreter reviewed:: Yes Do you feel safe going back to the place where you live?: Yes      Need for Family Participation in Patient Care: Yes (Comment) Care giver  support system in place?: Yes (comment) Current home services: DME (Rollator per PT) Criminal Activity/Legal Involvement Pertinent to Current Situation/Hospitalization: No - Comment as needed  Activities of Daily Living Home Assistive Devices/Equipment: Other (Comment), Eyeglasses (rollator) ADL Screening (condition at time of admission) [name] cognitive ability adequate to safely complete daily activities?: Yes Is the patient deaf or have difficulty hearing?: No Does the patient have difficulty seeing, even when wearing glasses/contacts?: No Does the patient have difficulty concentrating, remembering, or making decisions?: Yes Patient able to express need for assistance with ADLs?: Yes Does the patient have difficulty dressing or bathing?: No Independently performs ADLs?: Yes (appropriate for developmental age) Does the patient have difficulty walking or climbing stairs?: Yes Weakness of Legs: Both Weakness of Arms/Hands: None  Permission Sought/Granted Permission sought to share information with : Case Manager, Customer service manager, Family Supports Permission granted to share information with : Yes, Verbal Permission Granted  Share Information with NAME: Ned Card     Permission granted to share info w Relationship: Niece  Permission granted to share info w Contact Information: [phone] (Mobile)  Emotional Assessment Appearance:: Disheveled Attitude/Demeanor/Rapport: Lethargic, Engaged Affect (typically observed): Calm, Accepting Orientation: : Oriented to Self, Oriented to Place, Oriented to  Time, Oriented to Situation Alcohol / Substance Use: Not Applicable Psych Involvement: No (comment)  Admission diagnosis:  Closed right hip fracture ([place]) [S72.001A] Closed fracture of right hip, initial encounter ([place]) [  S72.001A] Patient Active Problem List   Diagnosis Date Noted  . Closed right hip fracture ([place]) 05/06/2020  . Essential hypertension 05/06/2020   . Hypothyroidism 05/06/2020  . Lumbar spondylosis 06/01/2017  . Lumbar degenerative disc disease 06/01/2017  . History of lumbar fusion 06/01/2017  . 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 Chronic pain syndrome 06/01/2017  . SI joint arthritis 06/01/2017   PCP:  [place]:   [id number] [place], [place] [address] Phone: [phone] Fax: [phone]     Social Determinants of Health (SDOH) Interventions    Readmission Risk Interventions No flowsheet data found.

## 2020-05-09 NOTE — Progress Notes (Signed)
Subjective:  POD #2 s/p right hip hemiarthroplasty.   Patient reports right hip and shoulder pain as mild today.  Patient is seen in bed today.  Patient was able to get up to a chair with physical therapy earlier but is apprehensive about walking.  She states her right shoulder pain has significantly improved today.  She is not having any significant pain in the right hip at rest.  RN at the bedside.  Objective:   VITALS:   Vitals:   05/08/20 1955 05/09/20 0431 05/09/20 0751 05/09/20 1125  BP: (!) 106/51 132/63 137/69 104/60  Pulse: 92 93 99 85  Resp:  17 20 17   Temp: 98.3 F (36.8 C) 98.7 F (37.1 C) 99.5 F (37.5 C) 98.4 F (36.9 C)  TempSrc:  Oral Oral Oral  SpO2: 96% 94% 98% 97%  Weight:      Height:        PHYSICAL EXAM: Right lower extremity Neurovascular intact Sensation intact distally Intact pulses distally Dorsiflexion/Plantar flexion intact Aquacel dressing: moderate serosanguineous drainage No cellulitis present Compartment soft  LABS  Results for orders placed or performed during the hospital encounter of 05/06/20 (from the past 24 hour(s))  Basic metabolic panel     Status: Abnormal   Collection Time: 05/09/20  5:55 AM  Result Value Ref Range   Sodium 130 (L) 135 - 145 mmol/L   Potassium 3.9 3.5 - 5.1 mmol/L   Chloride 99 98 - 111 mmol/L   CO2 25 22 - 32 mmol/L   Glucose, Bld 108 (H) 70 - 99 mg/dL   BUN 45 (H) 8 - 23 mg/dL   Creatinine, Ser 0.89 0.44 - 1.00 mg/dL   Calcium 8.3 (L) 8.9 - 10.3 mg/dL   GFR, Estimated >60 >60 mL/min   Anion gap 6 5 - 15  CBC     Status: Abnormal   Collection Time: 05/09/20  7:33 AM  Result Value Ref Range   WBC 15.4 (H) 4.0 - 10.5 K/uL   RBC 2.83 (L) 3.87 - 5.11 MIL/uL   Hemoglobin 8.4 (L) 12.0 - 15.0 g/dL   HCT 25.5 (L) 36 - 46 %   MCV 90.1 80.0 - 100.0 fL   MCH 29.7 26.0 - 34.0 pg   MCHC 32.9 30.0 - 36.0 g/dL   RDW 15.8 (H) 11.5 - 15.5 %   Platelets 236 150 - 400 K/uL   nRBC 0.0 0.0 - 0.2 %    DG Shoulder  Right Port  Result Date: 05/07/2020 CLINICAL DATA:  79 year old female with right shoulder pain. EXAM: PORTABLE RIGHT SHOULDER COMPARISON:  Chest radiograph dated 02/17/2019. FINDINGS: There is no acute fracture or dislocation. No significant arthritic changes. There is mild degenerative changes of the [facility] joint with spurring. The soft tissues are unremarkable. A wire over the right axilla, likely superimposed on the patient. IMPRESSION: 1. No acute fracture or dislocation. 2. Mild degenerative changes of the [facility] joint. Electronically Signed   By: Anner Crete M.D.   On: 05/07/2020 18:34   DG HIP PORT UNILAT WITH PELVIS 1V RIGHT  Result Date: 05/07/2020 CLINICAL DATA:  Status post hip replacement EXAM: DG HIP (WITH OR WITHOUT PELVIS) 1V PORT RIGHT COMPARISON:  05/05/2020 FINDINGS: Status post right hip hemiarthroplasty with intact hardware and grossly normal alignment. Mild gas in the soft tissues consistent with recent surgery. IMPRESSION: Status post right hip hemiarthroplasty with expected postsurgical change Electronically Signed   By: Donavan Foil M.D.   On: 05/07/2020 18:33  Assessment/Plan: 2 Days Post-Op   Principal Problem:   Closed right hip fracture (HCC) Active Problems:   Chronic pain syndrome   Essential hypertension   Hypothyroidism  Patient is making progress today.  He was able to get out of bed with physical therapy.  Her right hip and shoulder pain are improving.  Continue with physical therapy as tolerated.  Patient will require skilled nursing facility upon discharge.  Continue Lovenox for DVT prophylaxis for 2 weeks after discharge until follow-up in the office.  Patient will follow up with Dr. Mack Guise in the office 2 weeks following discharge.  Patient is okay for discharge from an orthopedic standpoint when she is cleared medically.    [place] , MD 05/09/2020, 12:16 PM

## 2020-05-09 NOTE — Progress Notes (Signed)
Physical Therapy Treatment Patient Details Name: Stacy Allen MRN: [id number] DOB: 1941/05/13 Today's Date: 05/09/2020    History of Present Illness Pt is a 79 y.o. female with medical history significant for coronary artery disease, hypertension, fibromyalgia, dyslipidemia who presents to the ER at the request of her orthopedic surgeon for a right femoral neck fracture, underwent R hip hemiarthroplasty.    PT Comments    Patient is in bed upon PT arrival. Agreeable to participate in therapy due to her not having eaten yet. Is fearful of moving RLE initially requiring max cueing and encouragement. Unable to actively move RLE for transition to EOB sitting. Tolerated weight shifts on EOB onto/off of R hip with gradual increase and cues for pain control. Sit to stand from raised bed required cueing for sequencing and encouragement for RLE on ground. Patient refused ambulation but agreeable to stand pivot transfer to recliner. Stand pivot transfer to chair requires max A with patient demonstrating unsafe reaching habits and poor safety awareness. Educated on surgical precautions and need for safety awareness.  Patient set up in chair with needs met, nursing present in room for medications. Current POC remains appropriate at this time.     Follow Up Recommendations  SNF     Equipment Recommendations  Other (comment) (TBD at next level of care)    Recommendations for Other Services       Precautions / Restrictions Precautions Precautions: Posterior Hip Precaution Booklet Issued: No Restrictions Weight Bearing Restrictions: Yes RLE Weight Bearing: Weight bearing as tolerated    Mobility  Bed Mobility Overal bed mobility: Needs Assistance       Supine to sit: Mod assist;Max assist;HOB elevated     General bed mobility comments: max assist required for RLE movement however patient able to move LLE and UE's with min/mod A  Transfers Overall transfer level: Needs  assistance Equipment used: Rolling walker (2 wheeled) Transfers: Sit to/from [facility] Sit to Stand: Mod assist;From elevated surface Stand pivot transfers: Max assist;From elevated surface       General transfer comment: Patient requires mod A for STS from raised surface due to pain. She requries max A for SPT due to fear of movement of RLE.  Ambulation/Gait             General Gait Details: patient refused ambulation   Stairs             Wheelchair Mobility    Modified Rankin (Stroke Patients Only)       Balance Overall balance assessment: Needs assistance Sitting-balance support: Feet supported;Bilateral upper extremity supported Sitting balance-Leahy Scale: Poor Sitting balance - Comments: Pt unwilling to perform any weight shift to the R due to increased pain Postural control: Left lateral lean Standing balance support: Bilateral upper extremity supported Standing balance-Leahy Scale: Poor Standing balance comment: Heavy reliance upon UE's. refuse to put weight on RLE                            Cognition Arousal/Alertness: Awake/alert Behavior During Therapy: [facility] for tasks assessed/performed Overall Cognitive Status: Within Functional Limits for tasks assessed                                 General Comments: Patient slightly sleepy but able to participate in therapy session. Fearful of movement of RLE.      Exercises Total Joint Exercises  Ankle Circles/Pumps: AROM;Strengthening;Both;10 reps Gluteal Sets: AROM;Strengthening;Both;10 reps Other Exercises Other Exercises: seated EOB tolerance and weight shift training onto RLE x 4 minutes    General Comments        Pertinent Vitals/Pain Pain Assessment: Faces Faces Pain Scale: Hurts little more Pain Location: R hip Pain Descriptors / Indicators: Aching;Grimacing;Moaning;Guarding Pain Intervention(s): Limited activity within patient's  tolerance;Monitored during session;RN gave pain meds during session;Repositioned    Home Living                      Prior Function            PT Goals (current goals can now be found in the care plan section) Acute Rehab PT Goals Patient Stated Goal: to have less pain PT Goal Formulation: With patient Time For Goal Achievement: 05/22/20 Potential to Achieve Goals: Good Progress towards PT goals: Progressing toward goals    Frequency    BID      PT Plan Current plan remains appropriate    Co-evaluation              AM-PAC PT "6 Clicks" Mobility   Outcome Measure  Help needed turning from your back to your side while in a flat bed without using bedrails?: A Lot Help needed moving from lying on your back to sitting on the side of a flat bed without using bedrails?: A Lot Help needed moving to and from a bed to a chair (including a wheelchair)?: A Lot Help needed standing up from a chair using your arms (e.g., wheelchair or bedside chair)?: A Lot Help needed to walk in hospital room?: Total Help needed climbing 3-5 steps with a railing? : Total 6 Click Score: 10    End of Session Equipment Utilized During Treatment: Gait belt Activity Tolerance: Patient limited by fatigue;Patient limited by pain Patient left: with call bell/phone within reach;with SCD's reapplied;in chair;with chair alarm set;with nursing/sitter in room Nurse Communication: Mobility status PT Visit Diagnosis: Other abnormalities of gait and mobility (R26.89);Muscle weakness (generalized) (M62.81);Difficulty in walking, not elsewhere classified (R26.2)     Time: 4132-4401 PT Time Calculation (min) (ACUTE ONLY): 16 min  Charges:  $Therapeutic Exercise: 8-22 mins                    Janna Arch, PT, DPT   05/09/2020, 8:29 AM

## 2020-05-10 DIAGNOSIS — G894 Chronic pain syndrome: Secondary | ICD-10-CM | POA: Diagnosis not present

## 2020-05-10 DIAGNOSIS — S72001K Fracture of unspecified part of neck of right femur, subsequent encounter for closed fracture with nonunion: Secondary | ICD-10-CM | POA: Diagnosis not present

## 2020-05-10 DIAGNOSIS — I10 Essential (primary) hypertension: Secondary | ICD-10-CM | POA: Diagnosis not present

## 2020-05-10 DIAGNOSIS — E039 Hypothyroidism, unspecified: Secondary | ICD-10-CM | POA: Diagnosis not present

## 2020-05-10 LAB — CBC
HCT: 21.5 % — ABNORMAL LOW (ref 36.0–46.0)
HCT: 21.6 % — ABNORMAL LOW (ref 36.0–46.0)
Hemoglobin: 7.1 g/dL — ABNORMAL LOW (ref 12.0–15.0)
Hemoglobin: 7.1 g/dL — ABNORMAL LOW (ref 12.0–15.0)
MCH: 29.5 pg (ref 26.0–34.0)
MCH: 29.5 pg (ref 26.0–34.0)
MCHC: 32.9 g/dL (ref 30.0–36.0)
MCHC: 33 g/dL (ref 30.0–36.0)
MCV: 89.2 fL (ref 80.0–100.0)
MCV: 89.6 fL (ref 80.0–100.0)
Platelets: 218 10*3/uL (ref 150–400)
Platelets: 222 10*3/uL (ref 150–400)
RBC: 2.41 MIL/uL — ABNORMAL LOW (ref 3.87–5.11)
RBC: 2.41 MIL/uL — ABNORMAL LOW (ref 3.87–5.11)
RDW: 15.8 % — ABNORMAL HIGH (ref 11.5–15.5)
RDW: 15.9 % — ABNORMAL HIGH (ref 11.5–15.5)
WBC: 11.5 10*3/uL — ABNORMAL HIGH (ref 4.0–10.5)
WBC: 14 10*3/uL — ABNORMAL HIGH (ref 4.0–10.5)
nRBC: 0 % (ref 0.0–0.2)
nRBC: 0 % (ref 0.0–0.2)

## 2020-05-10 LAB — SODIUM: Sodium: 128 mmol/L — ABNORMAL LOW (ref 135–145)

## 2020-05-10 MED ORDER — CEFAZOLIN SODIUM-DEXTROSE 2-4 GM/100ML-% IV SOLN
2.0000 g | Freq: Three times a day (TID) | INTRAVENOUS | Status: DC
  Administered 2020-05-10 – 2020-05-14 (×11): 2 g via INTRAVENOUS
  Filled 2020-05-10 (×16): qty 100

## 2020-05-10 NOTE — Progress Notes (Addendum)
Patients surgical site assessed x3. Each time site has become more swollen. Drainage on aquacel dressing has increased. Patient right hip near surgical site is slightly red. NP Ouma notified. Will continue to monitor.    Patient lab results WBC 14.0 HGB 7.1 Ouma notified regarding lab results.

## 2020-05-10 NOTE — NC FL2 (Signed)
[place] LEVEL OF CARE SCREENING TOOL     IDENTIFICATION  Patient Name: Stacy Allen Birthdate: 02-19-1941 Sex: female Admission Date (Current Location): 05/06/2020  [facility] and [place] Number:  Engineering geologist and Address:  [facility], [address], [place], [place] [address]      Provider Number: [id number]  Attending Physician Name and Address:  Nolberto Hanlon, MD  Relative Name and Phone Number:  Ned Card (Niece) [phone]    Current Level of Care: Hospital Recommended Level of Care: [place] Prior Approval Number:    Date Approved/Denied:   PASRR Number:    Discharge Plan: SNF    Current Diagnoses: Patient Active Problem List   Diagnosis Date Noted   Closed right hip fracture ([place]) 05/06/2020   Essential hypertension 05/06/2020   Hypothyroidism 05/06/2020   Lumbar spondylosis 06/01/2017   Lumbar degenerative disc disease 06/01/2017   History of lumbar fusion 06/01/2017   Age related osteoporosis 06/01/2017   Chronic pain syndrome 06/01/2017   SI joint arthritis 06/01/2017    Orientation RESPIRATION BLADDER Height & Weight     Self, Time, Situation, Place  Normal Continent, External catheter Weight: 104.3 kg Height:  5\' 3"  (160 cm)  BEHAVIORAL SYMPTOMS/MOOD NEUROLOGICAL BOWEL NUTRITION STATUS      Continent Diet  AMBULATORY STATUS COMMUNICATION OF NEEDS Skin   Extensive Assist Verbally Normal, Surgical wounds                       Personal Care Assistance Level of Assistance  Bathing, Dressing, Feeding Bathing Assistance: Maximum assistance Feeding assistance: Limited assistance (set up while in bed.) Dressing Assistance: Maximum assistance     Functional Limitations Info  Sight (Glasses) Sight Info: Impaired        SPECIAL CARE FACTORS FREQUENCY  PT (By licensed PT), OT (By licensed OT)     PT Frequency: 5x/week OT Frequency: 5x/week             Contractures Contractures Info: Not present    Additional Factors Info  Code Status, Allergies Code Status Info: Full Code Allergies Info: LATEX and Multiple See specifics: Zithromax, Adhesive tape, Codiene, Lactose intol, morphine related products, red dye, shellfish, Latex, Lodine, Penicillins, Pravastatin           Current Medications (05/10/2020):  This is the current hospital active medication list Current Facility-Administered Medications  Medication Dose Route Frequency Provider Last Rate Last Admin   acetaminophen (TYLENOL) tablet 325-650 mg  325-650 mg Oral Q6H PRN [place], MD   650 mg at 05/10/20 0559   albuterol (VENTOLIN HFA) 108 (90 Base) MCG/ACT inhaler 2 puff  2 puff Inhalation Q6H PRN Thornton Park, MD   2 puff at 05/10/20 0533   alum & mag hydroxide-simeth (MAALOX/MYLANTA) 200-200-20 MG/5ML suspension 30 mL  30 mL Oral Q4H PRN [place] Park, MD   30 mL at 05/10/20 1557   bisacodyl (DULCOLAX) suppository 10 mg  10 mg Rectal Daily PRN [place] Park, MD       ceFAZolin (ANCEF) IVPB 2g/100 mL premix  2 g Intravenous Q8H [place], MD 200 mL/hr at 05/10/20 1204 2 g at 05/10/20 1204   Chlorhexidine Gluconate Cloth 2 % PADS 6 each  6 each Topical Daily Thornton Park, MD   6 each at 05/10/20 0903   citalopram (CELEXA) tablet 40 mg  40 mg Oral Daily [place] Park, MD   40 mg at 05/10/20 0902   clonazePAM (  KLONOPIN) tablet 1 mg  1 mg Oral BID PRN Nolberto Hanlon, MD       docusate sodium (COLACE) capsule 100 mg  100 mg Oral BID [place] Park, MD   100 mg at 05/10/20 0902   enalapril (VASOTEC) tablet 20 mg  20 mg Oral Daily Nolberto Hanlon, MD   20 mg at 05/10/20 0902   enoxaparin (LOVENOX) injection 52.5 mg  0.5 mg/kg Subcutaneous Q24H [place] Park, MD   52.5 mg at 05/10/20 1761   ferrous sulfate tablet 325 mg  325 mg Oral BID [place], MD   325 mg at 05/10/20 0902   fluticasone (FLONASE) 50 MCG/ACT nasal spray 2 spray  2  spray Each Nare Daily Thornton Park, MD   2 spray at 05/09/20 0818   gabapentin (NEURONTIN) capsule 600 mg  600 mg Oral TID Thornton Park, MD   600 mg at 05/10/20 1500   HYDROmorphone (DILAUDID) injection 0.5-1 mg  0.5-1 mg Intravenous Q4H PRN [place] Park, MD       hydrOXYzine (ATARAX/VISTARIL) tablet 10 mg  10 mg Oral TID PRN Thornton Park, MD       levothyroxine (SYNTHROID) tablet 137 mcg  137 mcg Oral QAC breakfast Thornton [place], MD   137 mcg at 05/10/20 0514   lidocaine (LIDODERM) 5 % 1 patch  1 patch Transdermal Q24H Nolberto Hanlon, MD   1 patch at 05/10/20 1104   loratadine (CLARITIN) tablet 10 mg  10 mg Oral Daily [place] Park, MD   10 mg at 05/10/20 0902   meclizine (ANTIVERT) tablet 25 mg  25 mg Oral TID PRN Thornton Park, MD       metoprolol tartrate (LOPRESSOR) tablet 12.5 mg  12.5 mg Oral BID Thornton Park, MD   12.5 mg at 05/10/20 0902   mirtazapine (REMERON) tablet 15 mg  15 mg Oral QHS Thornton Park, MD   15 mg at 05/09/20 2211   nitroGLYCERIN (NITROSTAT) SL tablet 0.4 mg  0.4 mg Sublingual Q5 min PRN [place] Park, MD       ondansetron [facility]) tablet 4 mg  4 mg Oral Q6H PRN [place], MD       Or   ondansetron [facility]) injection 4 mg  4 mg Intravenous Q6H PRN [place], MD       oxyCODONE (Oxy IR/ROXICODONE) immediate release tablet 10-15 mg  10-15 mg Oral Q4H PRN [place] Park, MD   10 mg at 05/09/20 0129   oxyCODONE (Oxy IR/ROXICODONE) immediate release tablet 5-10 mg  5-10 mg Oral Q4H PRN Thornton Park, MD   10 mg at 05/10/20 1413   pantoprazole (PROTONIX) EC tablet 40 mg  40 mg Oral Daily Thornton Park, MD   40 mg at 05/10/20 0903   polyethylene glycol (MIRALAX / GLYCOLAX) packet 17 g  17 g Oral Daily Nolberto Hanlon, MD   17 g at 05/10/20 6073   polyvinyl alcohol (LIQUIFILM TEARS) 1.4 % ophthalmic solution   Both Eyes Daily Thornton Park, MD   Given at 05/08/20 1228   protein supplement (ENSURE MAX)  liquid  11 oz Oral Daily Thornton Park, MD   11 oz at 05/10/20 0901   senna (SENOKOT) tablet 8.6 mg  1 tablet Oral BID Thornton Park, MD   8.6 mg at 05/10/20 0902   sodium phosphate (FLEET) 7-19 GM/118ML enema 1 enema  1 enema Rectal Daily PRN Nolberto Hanlon, MD       tiZANidine (ZANAFLEX) tablet 4 mg  4 mg Oral Q6H PRN [place],  MD       [START ON 05/26/2020] Vitamin D (Ergocalciferol) (DRISDOL) capsule 50,000 Units  50,000 Units Oral Q30 days Thornton Park, MD       zolpidem [facility]) tablet 5 mg  5 mg Oral QHS PRN [place] Park, MD         Discharge Medications: Please see discharge summary for a list of discharge medications.  Relevant Imaging Results:  Relevant Lab Results:   Additional Information    Lorrane Mccay, Nonda Lou, RN

## 2020-05-10 NOTE — TOC Progression Note (Signed)
Transition of Care [facility]) - Progression Note    Patient Details  Name: Stacy Allen MRN: [id number] Date of Birth: 1941-02-08  Transition of Care [facility]) CM/SW Contact  Deklen Popelka, Nonda Lou, [place] Phone Number: 05/10/2020, 4:54 PM  Clinical Narrative: [facility] Team continues discharge planning. [place]-2 sent to SNFs for bed offers. Will continue to monitor for discharge planning. Spoke to [employer] at: [phone] to discuss potential discharge plan for SNF. She voices agreement with plan and prefers [facility] or [facility].    Expected Discharge Plan: [place] Barriers to Discharge: Continued Medical Work up  Expected Discharge Plan and Services Expected Discharge Plan: [place]   Discharge Planning Services: CM Consult Post Acute Care Choice: [place] Living arrangements for the past 2 months: Single Family Home                                       Social Determinants of Health (SDOH) Interventions    Readmission Risk Interventions No flowsheet data found.

## 2020-05-10 NOTE — Progress Notes (Signed)
PROGRESS NOTE    Stacy Allen  VPX:[id number] DOB: [birth year] DOA: 05/06/2020 PCP: Lindsey    Brief Narrative:  Stacy Allen is a 79 y.o. female with medical history significant for coronary artery disease, hypertension, dyslipidemia who presents to the ER at the request of her orthopedic surgeon for a right femoral neck fracture   11/12-c/o shoulder pain, xray negative for acute abn. 11/13- was getting in and out cath due to urine residual per nsg 216 on scan. Pt reports no BM so far  11/14-Per NP was notified by RN that patient's right hip surgical site is erythematous with surrounding erythema and copious drainage.  She was found to be afebrile and elevated blood pressures.  Orthopedic and I were made aware this am   Consultants:   Orthopedics  Procedures:   Antimicrobials:       Subjective:[name]  Pt reports shoulder pain improving. Has rt hip pain but no worse than before. No other complaints.  Objective: Vitals:   05/09/20 2019 05/10/20 0006 05/10/20 0435 05/10/20 0808  BP: (!) 123/55 (!) 129/57 (!) 115/59 (!) 131/57  Pulse: 93 86 92 89  Resp: 18 18 15 18   Temp: 98.7 F (37.1 C) 98.4 F (36.9 C) 98.6 F (37 C) 98.6 F (37 C)  TempSrc: Oral Oral Oral Oral  SpO2: 92%  95% 94%  Weight:      Height:        Intake/Output Summary (Last 24 hours) at 05/10/2020 0916 Last data filed at 05/10/2020 0910 Gross per 24 hour  Intake 586.67 ml  Output 1750 ml  Net -1163.33 ml   Filed Weights   05/06/20 1129 05/06/20 2257  Weight: 102.1 kg 104.3 kg    Examination: Calm, appears tired, nad cta no w/r/r Regular s1/s2 no m/r/g Soft bening , +bs Rt hip no drainage when I removed dressing. Leg edematous, warm to touch but likely from edema. No real sign of infection. Staples appear clean Aaxox3, grossly intact  Data Reviewed: I have personally reviewed following labs and imaging studies  CBC: Recent Labs  Lab 05/07/20 0509 05/08/20 0623  05/09/20 0733 05/09/20 2353 05/10/20 0614  WBC 5.7 9.3 15.4* 14.0* 11.5*  HGB 11.5* 9.3* 8.4* 7.1* 7.1*  HCT 35.7* 28.8* 25.5* 21.6* 21.5*  MCV 91.1 92.0 90.1 89.6 89.2  PLT 258 238 236 218 462   Basic Metabolic Panel: Recent Labs  Lab 05/06/20 1131 05/07/20 0509 05/08/20 0623 05/09/20 0555 05/10/20 0614  NA 134* 136 133* 130* 128*  K 4.0 4.0 4.3 3.9  --   CL 97* 102 100 99  --   CO2 27 26 27 25   --   GLUCOSE 141* 96 123* 108*  --   BUN 15 14 22  45*  --   CREATININE 0.62 0.70 0.74 0.89  --   CALCIUM 8.6* 8.7* 8.5* 8.3*  --    GFR: Estimated Creatinine Clearance: 61.2 mL/min (by C-G formula based on SCr of 0.89 mg/dL). Liver Function Tests: No results for input(s): AST, ALT, ALKPHOS, BILITOT, PROT, ALBUMIN in the last 168 hours. No results for input(s): LIPASE, AMYLASE in the last 168 hours. No results for input(s): AMMONIA in the last 168 hours. Coagulation Profile: Recent Labs  Lab 05/06/20 1131  INR 1.0   Cardiac Enzymes: No results for input(s): CKTOTAL, CKMB, CKMBINDEX, TROPONINI in the last 168 hours. BNP (last 3 results) No results for input(s): PROBNP in the last 8760 hours. HbA1C: No results for input(s): HGBA1C  in the last 72 hours. CBG: No results for input(s): GLUCAP in the last 168 hours. Lipid Profile: No results for input(s): CHOL, HDL, LDLCALC, TRIG, CHOLHDL, LDLDIRECT in the last 72 hours. Thyroid Function Tests: No results for input(s): TSH, T4TOTAL, FREET4, T3FREE, THYROIDAB in the last 72 hours. Anemia Panel: No results for input(s): VITAMINB12, FOLATE, FERRITIN, TIBC, IRON, RETICCTPCT in the last 72 hours. Sepsis Labs: No results for input(s): PROCALCITON, LATICACIDVEN in the last 168 hours.  Recent Results (from the past 240 hour(s))  Respiratory Panel by RT PCR (Flu A&B, Covid) - Nasopharyngeal Swab     Status: None   Collection Time: 05/06/20 12:43 PM   Specimen: Nasopharyngeal Swab  Result Value Ref Range Status   SARS Coronavirus 2  by RT PCR NEGATIVE NEGATIVE Final    Comment: (NOTE) SARS-CoV-2 target nucleic acids are NOT DETECTED.  The SARS-CoV-2 RNA is generally detectable in upper respiratoy specimens during the acute phase of infection. The lowest concentration of SARS-CoV-2 viral copies this assay can detect is 131 copies/mL. A negative result does not preclude SARS-Cov-2 infection and should not be used as the sole basis for treatment or other patient management decisions. A negative result may occur with  improper specimen collection/handling, submission of specimen other than nasopharyngeal swab, presence of viral mutation(s) within the areas targeted by this assay, and inadequate number of viral copies (<131 copies/mL). A negative result must be combined with clinical observations, patient history, and epidemiological information. The expected result is Negative.  Fact Sheet for Patients:  PinkCheek.be  Fact Sheet for Healthcare Providers:  GravelBags.it  This test is no t yet approved or cleared by the [place] FDA and  has been authorized for detection and/or diagnosis of SARS-CoV-2 by FDA under an Emergency Use Authorization (EUA). This EUA will remain  in effect (meaning this test can be used) for the duration of the COVID-19 declaration under Section 564(b)(1) of the Act, 21 U.S.C. section 360bbb-3(b)(1), unless the authorization is terminated or revoked sooner.     Influenza A by PCR NEGATIVE NEGATIVE Final   Influenza B by PCR NEGATIVE NEGATIVE Final    Comment: (NOTE) The Xpert Xpress SARS-CoV-2/FLU/RSV assay is intended as an aid in  the diagnosis of influenza from Nasopharyngeal swab specimens and  should not be used as a sole basis for treatment. Nasal washings and  aspirates are unacceptable for Xpert Xpress SARS-CoV-2/FLU/RSV  testing.  Fact Sheet for Patients: PinkCheek.be  Fact Sheet  for Healthcare Providers: GravelBags.it  This test is not yet approved or cleared by the [place] FDA and  has been authorized for detection and/or diagnosis of SARS-CoV-2 by  FDA under an Emergency Use Authorization (EUA). This EUA will remain  in effect (meaning this test can be used) for the duration of the  Covid-19 declaration under Section 564(b)(1) of the Act, 21  U.S.C. section 360bbb-3(b)(1), unless the authorization is  terminated or revoked. Performed at [facility], [address], [place], [place] [address]          Radiology Studies: No results found.      Scheduled Meds: . Chlorhexidine Gluconate Cloth  6 each Topical Daily  . citalopram  40 mg Oral Daily  . docusate sodium  100 mg Oral BID  . enalapril  20 mg Oral Daily  . enoxaparin (LOVENOX) injection  0.5 mg/kg Subcutaneous Q24H  . ferrous sulfate  325 mg Oral BID  . fluticasone  2 spray Each Nare Daily  .  gabapentin  600 mg Oral TID  . levothyroxine  137 mcg Oral QAC breakfast  . lidocaine  1 patch Transdermal Q24H  . loratadine  10 mg Oral Daily  . metoprolol tartrate  12.5 mg Oral BID  . mirtazapine  15 mg Oral QHS  . pantoprazole  40 mg Oral Daily  . polyethylene glycol  17 g Oral Daily  . polyvinyl alcohol   Both Eyes Daily  . Ensure Max Protein  11 oz Oral Daily  . senna  1 tablet Oral BID  . [START ON 05/26/2020] Vitamin D (Ergocalciferol)  50,000 Units Oral Q30 days   Continuous Infusions: . sodium chloride 50 mL/hr at 05/10/20 0910  . lactated ringers 10 mL/hr at 05/07/20 1338    Assessment & Plan:   Principal Problem:   Closed right hip fracture [facility]) Active Problems:   Chronic pain syndrome   Essential hypertension   Hypothyroidism   Closed right hip fracture-Status post fall which appears to be mechanical S/p right hip hemiarthroplasty postop #2 by Dr. Gretta Arab on 11/11  Following, dressing change by Dr. Zandra Abts today.  No  evidence of purulent drainage.  Serosanguinous drainage.Compartment compressible WBC down, afebrile  Ortho recommends IV Kefzol empirically given her drainage to prevent possible wound infection pain control  Bowel regimen SNF pending    Hypertension Low yesterday, her ACE inhibitor's Daily  Continue with beta-blockers  Monitor    Leukocytosis-likely reactive/stress induced. Going down, afebrile  Continue to monitor    Hyponatremia- etiology unclear. Due to pain, or hypovolemia as she does appear to be on dry side Na down to 128, is there SIADH ? Does not appear to be volume overloaded, will monitor. If  Continues to decrease may need low dose lasix x1 Dc ivf  Post op anemia- acute blood loss from surgery. Also some component of dilution due to ivf Transfuse if Hg<7 Monitor closely   Hypothyroidism Continue Synthroid   Depression and anxiety Continue citalopram Continue with Klonopin      Hypothyroidism Continue Synthroid   GERD Continue Protonix    DVT prophylaxis: lovenox Code Status: Full Family Communication: None at bedside  Status is: Inpatient  Remains inpatient appropriate because: Unsafe discharge, needs ivf.   Dispo: The patient is from: Home              Anticipated d/c is to: SNF              Anticipated d/c date is: 2 days              Patient currently is not medically stable to d/c.  Electrolytes imbalance, SNF pending, getting IV antibiotics      LOS: 4 days   Time spent: 45 minutes with more than 50% on Albert Lea, MD [facility] Pager [phone]  If 7PM-7AM, please contact night-coverage [email or web address] Password [facility] 05/10/2020, 9:16 AM

## 2020-05-10 NOTE — Progress Notes (Addendum)
Dressing saturated with serosanguinous fluid; changed. Also, post bladder scan urine is 358mls. Will I&On as directed. Provider notified. Will continue to watch pt.  Spoke with Dr. Stark Klein. Instructed not to I&O cath pt at this time. Bladder scan pt in 4 hrs and report urine amount at that time.

## 2020-05-10 NOTE — Progress Notes (Signed)
  Subjective:  POD #3 s/p right hip hemiarthroplasty.   Patient reports right hip pain as mild.  Patient was noted to have increased drainage on her hip dressing overnight.  Objective:   VITALS:   Vitals:   05/09/20 2019 05/10/20 0006 05/10/20 0435 05/10/20 0808  BP: (!) 123/55 (!) 129/57 (!) 115/59 (!) 131/57  Pulse: 93 86 92 89  Resp: 18 18 15 18   Temp: 98.7 F (37.1 C) 98.4 F (36.9 C) 98.6 F (37 C) 98.6 F (37 C)  TempSrc: Oral Oral Oral Oral  SpO2: 92%  95% 94%  Weight:      Height:        PHYSICAL EXAM: Right lower extremity: I personally change the patient's dressing today.  She had serosanguineous drainage under her Aquacel dressing.  There is no evidence of purulent drainage.  Patient does not have surrounding erythema no significant ecchymosis.  Her thigh compartment is soft and compressible.  Distally she remains neurovascular intact he can dorsiflex and plantarflex her ankle, has palpable pedal pulses and intact sensation to light touch.   LABS  Results for orders placed or performed during the hospital encounter of 05/06/20 (from the past 24 hour(s))  CBC     Status: Abnormal   Collection Time: 05/09/20 11:53 PM  Result Value Ref Range   WBC 14.0 (H) 4.0 - 10.5 K/uL   RBC 2.41 (L) 3.87 - 5.11 MIL/uL   Hemoglobin 7.1 (L) 12.0 - 15.0 g/dL   HCT 21.6 (L) 36 - 46 %   MCV 89.6 80.0 - 100.0 fL   MCH 29.5 26.0 - 34.0 pg   MCHC 32.9 30.0 - 36.0 g/dL   RDW 15.9 (H) 11.5 - 15.5 %   Platelets 218 150 - 400 K/uL   nRBC 0.0 0.0 - 0.2 %  CBC     Status: Abnormal   Collection Time: 05/10/20  6:14 AM  Result Value Ref Range   WBC 11.5 (H) 4.0 - 10.5 K/uL   RBC 2.41 (L) 3.87 - 5.11 MIL/uL   Hemoglobin 7.1 (L) 12.0 - 15.0 g/dL   HCT 21.5 (L) 36 - 46 %   MCV 89.2 80.0 - 100.0 fL   MCH 29.5 26.0 - 34.0 pg   MCHC 33.0 30.0 - 36.0 g/dL   RDW 15.8 (H) 11.5 - 15.5 %   Platelets 222 150 - 400 K/uL   nRBC 0.0 0.0 - 0.2 %  Sodium     Status: Abnormal   Collection Time:  05/10/20  6:14 AM  Result Value Ref Range   Sodium 128 (L) 135 - 145 mmol/L    No results found.  Assessment/Plan: 3 Days Post-Op   Principal Problem:   Closed right hip fracture (HCC) Active Problems:   Chronic pain syndrome   Essential hypertension   Hypothyroidism  Patient will be placed on IV Kefzol empirically given her drainage to prevent possible wound infection.  I will continue to monitor her dressings.  Patient will continue with physical therapy as tolerated.  Patient is white count is 11.5 today down from 14 yesterday.  Patient is afebrile with stable vital signs.  There is no clinical evidence of postop infection at this time.    Thornton Park , MD 05/10/2020, 11:42 AM

## 2020-05-10 NOTE — Progress Notes (Signed)
Physical Therapy Treatment Patient Details Name: Stacy Allen MRN: [id number] DOB: 12/30/1940 Today's Date: 05/10/2020    History of Present Illness Pt is a 79 y.o. female with medical history significant for coronary artery disease, hypertension, fibromyalgia, dyslipidemia who presents to the ER at the request of her orthopedic surgeon for a right femoral neck fracture, underwent R hip hemiarthroplasty.    PT Comments    HgB 7.1 and increased drainage noted.  Talked with RN prior and OK to do bedside ex.  Participated in exercises as described below.  ROM decreased R hip with primarily increased pain with hip/knee flexion.  MD in at end of session to check.   Follow Up Recommendations  SNF     Equipment Recommendations  Other (comment) (TBD at next level of care)    Recommendations for Other Services       Precautions / Restrictions Precautions Precautions: Posterior Hip Precaution Booklet Issued: No Restrictions Weight Bearing Restrictions: Yes RLE Weight Bearing: Weight bearing as tolerated    Mobility  Bed Mobility               General bed mobility comments: deferred  Transfers                    Ambulation/Gait                 Stairs             Wheelchair Mobility    Modified Rankin (Stroke Patients Only)       Balance                                            Cognition Arousal/Alertness: Awake/alert Behavior During Therapy: WFL for tasks assessed/performed Overall Cognitive Status: Within Functional Limits for tasks assessed                                        Exercises Total Joint Exercises Ankle Circles/Pumps: AROM;Strengthening;Both;10 reps Quad Sets: AROM;Strengthening;Both;10 reps Gluteal Sets: AROM;Strengthening;Both;10 reps Heel Slides: Right;10 reps;AROM;Left;Strengthening;AAROM Hip ABduction/ADduction: AROM;AAROM;Strengthening;Right;Left;10 reps Straight Leg  Raises: AAROM;AROM;Strengthening;Right;Left;10 reps    General Comments        Pertinent Vitals/Pain Pain Assessment: Faces Faces Pain Scale: Hurts whole lot Pain Location: R hip Pain Descriptors / Indicators: Aching;Grimacing;Moaning;Guarding Pain Intervention(s): Limited activity within patient's tolerance;Monitored during session;Premedicated before session;Repositioned    Home Living                      Prior Function            PT Goals (current goals can now be found in the care plan section) Progress towards PT goals: Not progressing toward goals - comment    Frequency    BID      PT Plan Current plan remains appropriate    Co-evaluation              AM-PAC PT "6 Clicks" Mobility   Outcome Measure  Help needed turning from your back to your side while in a flat bed without using bedrails?: A Lot Help needed moving from lying on your back to sitting on the side of a flat bed without using bedrails?: A Lot Help needed moving to and from a bed to a  chair (including a wheelchair)?: A Lot Help needed standing up from a chair using your arms (e.g., wheelchair or bedside chair)?: A Lot Help needed to walk in hospital room?: Total Help needed climbing 3-5 steps with a railing? : Total 6 Click Score: 10    End of Session Equipment Utilized During Treatment: Gait belt Activity Tolerance: Patient limited by fatigue;Patient limited by pain Patient left: with call bell/phone within reach;with SCD's reapplied;in chair;with chair alarm set;with nursing/sitter in room Nurse Communication: Mobility status PT Visit Diagnosis: Other abnormalities of gait and mobility (R26.89);Muscle weakness (generalized) (M62.81);Difficulty in walking, not elsewhere classified (R26.2)     Time: 8316-7425 PT Time Calculation (min) (ACUTE ONLY): 16 min  Charges:  $Therapeutic Exercise: 8-22 mins                    Chesley Noon, PTA 05/10/20, 10:49 AM '

## 2020-05-10 NOTE — Progress Notes (Signed)
Notified by patient's RN that patient's right hip  surgical site is edematous with surrounding erythema and copius drainage which appears to have worsened since last assessment.  On assessment at the bedside, she was afebrile with blood pressure 224/114 mm Hg and pulse rate 81 beats/min. Right hip surgical site is edematous with surrounding erythema. Aquacel dressing is saturated with moderate serosanguineous drainage. Compartment is hard. Skin warm to touch.   Post surgical site erythema, warmth, swelling with copious drainage concerning for possible  postoperative cellulitis? No fevers,  However + leukocytosis -Monitor and possible consider abx coverage -Ortho to follow, will notify via secure chat as well    Rufina Falco, BSN, MSN, DNP, CCRN,FNP-C  [facility] Nurse Practitioner  [facility]

## 2020-05-11 DIAGNOSIS — G894 Chronic pain syndrome: Secondary | ICD-10-CM | POA: Diagnosis not present

## 2020-05-11 DIAGNOSIS — I10 Essential (primary) hypertension: Secondary | ICD-10-CM | POA: Diagnosis not present

## 2020-05-11 DIAGNOSIS — E039 Hypothyroidism, unspecified: Secondary | ICD-10-CM | POA: Diagnosis not present

## 2020-05-11 DIAGNOSIS — S72001K Fracture of unspecified part of neck of right femur, subsequent encounter for closed fracture with nonunion: Secondary | ICD-10-CM | POA: Diagnosis not present

## 2020-05-11 LAB — BASIC METABOLIC PANEL
Anion gap: 6 (ref 5–15)
BUN: 38 mg/dL — ABNORMAL HIGH (ref 8–23)
CO2: 24 mmol/L (ref 22–32)
Calcium: 8.2 mg/dL — ABNORMAL LOW (ref 8.9–10.3)
Chloride: 99 mmol/L (ref 98–111)
Creatinine, Ser: 0.85 mg/dL (ref 0.44–1.00)
GFR, Estimated: >60 mL/min (ref >60)
Glucose, Bld: 119 mg/dL — ABNORMAL HIGH (ref 70–99)
Potassium: 4.1 mmol/L (ref 3.5–5.1)
Sodium: 129 mmol/L — ABNORMAL LOW (ref 135–145)

## 2020-05-11 LAB — CBC
HCT: 21.3 % — ABNORMAL LOW (ref 36.0–46.0)
Hemoglobin: 6.9 g/dL — ABNORMAL LOW (ref 12.0–15.0)
MCH: 29.9 pg (ref 26.0–34.0)
MCHC: 32.4 g/dL (ref 30.0–36.0)
MCV: 92.2 fL (ref 80.0–100.0)
Platelets: 260 10*3/uL (ref 150–400)
RBC: 2.31 MIL/uL — ABNORMAL LOW (ref 3.87–5.11)
RDW: 16.2 % — ABNORMAL HIGH (ref 11.5–15.5)
WBC: 9.5 10*3/uL (ref 4.0–10.5)
nRBC: 0 % (ref 0.0–0.2)

## 2020-05-11 LAB — PREPARE RBC (CROSSMATCH)

## 2020-05-11 LAB — ABO/RH: ABO/RH(D): A POS

## 2020-05-11 MED ORDER — SODIUM CHLORIDE 0.9% IV SOLUTION: Freq: Once | INTRAVENOUS | Status: AC

## 2020-05-11 MED ORDER — ACETAMINOPHEN 325 MG PO TABS
650.0000 mg | ORAL_TABLET | Freq: Once | ORAL | Status: AC
  Administered 2020-05-11: 650 mg via ORAL
  Filled 2020-05-11: qty 2

## 2020-05-11 MED ORDER — SIMETHICONE 80 MG PO CHEW
80.0000 mg | CHEWABLE_TABLET | Freq: Four times a day (QID) | ORAL | Status: DC | PRN
  Filled 2020-05-11: qty 1

## 2020-05-11 MED ORDER — FUROSEMIDE 10 MG/ML IJ SOLN
20.0000 mg | Freq: Once | INTRAMUSCULAR | Status: AC
  Administered 2020-05-11: 20 mg via INTRAVENOUS
  Filled 2020-05-11: qty 4

## 2020-05-11 MED ORDER — SIMETHICONE 80 MG PO CHEW
80.0000 mg | CHEWABLE_TABLET | Freq: Once | ORAL | Status: AC
  Administered 2020-05-11: 80 mg via ORAL
  Filled 2020-05-11: qty 1

## 2020-05-11 NOTE — Progress Notes (Signed)
Physical Therapy Treatment Patient Details Name: Stacy Allen MRN: [id number] DOB: 07/14/1940 Today's Date: 05/11/2020    History of Present Illness Pt is a 79 y.o. female with medical history significant for coronary artery disease, hypertension, fibromyalgia, dyslipidemia who presents to the ER at the request of her orthopedic surgeon for a right femoral neck fracture, underwent R hip hemiarthroplasty.    PT Comments    Pt reports feeling OK - tired.  Participated in exercises as described below.  Generally fatigued with ex.  HgB 6.9.  Pt stated she is awaiting re-check and possible transfusion.  Mobility held per protocol and fatigue.  Will check this pm and continue as appropriate.   Follow Up Recommendations  SNF     Equipment Recommendations  Other (comment) (TBD at next level of care)    Recommendations for Other Services       Precautions / Restrictions Precautions Precautions: Posterior Hip Precaution Booklet Issued: No Restrictions Weight Bearing Restrictions: Yes RLE Weight Bearing: Weight bearing as tolerated    Mobility  Bed Mobility               General bed mobility comments: deferred  Transfers                    Ambulation/Gait                 Stairs             Wheelchair Mobility    Modified Rankin (Stroke Patients Only)       Balance                                            Cognition Arousal/Alertness: Awake/alert Behavior During Therapy: WFL for tasks assessed/performed Overall Cognitive Status: Within Functional Limits for tasks assessed                                        Exercises Total Joint Exercises Ankle Circles/Pumps: AROM;Strengthening;Both;10 reps Quad Sets: AROM;Strengthening;Both;10 reps Gluteal Sets: AROM;Strengthening;Both;10 reps Heel Slides: Right;10 reps;AROM;Left;Strengthening;AAROM Hip ABduction/ADduction:  AROM;AAROM;Strengthening;Right;Left;10 reps Straight Leg Raises: AAROM;AROM;Strengthening;Right;Left;10 reps    General Comments        Pertinent Vitals/Pain Pain Assessment: Faces Faces Pain Scale: Hurts little more Pain Location: R hip Pain Descriptors / Indicators: Aching;Grimacing;Moaning;Guarding Pain Intervention(s): Limited activity within patient's tolerance;Monitored during session    Home Living                      Prior Function            PT Goals (current goals can now be found in the care plan section) Progress towards PT goals: Not progressing toward goals - comment    Frequency    BID      PT Plan Current plan remains appropriate    Co-evaluation              AM-PAC PT "6 Clicks" Mobility   Outcome Measure  Help needed turning from your back to your side while in a flat bed without using bedrails?: A Lot Help needed moving from lying on your back to sitting on the side of a flat bed without using bedrails?: A Lot Help needed moving to and from a bed to a  chair (including a wheelchair)?: A Lot Help needed standing up from a chair using your arms (e.g., wheelchair or bedside chair)?: A Lot Help needed to walk in hospital room?: Total Help needed climbing 3-5 steps with a railing? : Total 6 Click Score: 10    End of Session Equipment Utilized During Treatment: Gait belt Activity Tolerance: Patient limited by fatigue;Patient limited by pain Patient left: with call bell/phone within reach;with SCD's reapplied;in chair;with chair alarm set;with nursing/sitter in room Nurse Communication: Mobility status PT Visit Diagnosis: Other abnormalities of gait and mobility (R26.89);Muscle weakness (generalized) (M62.81);Difficulty in walking, not elsewhere classified (R26.2)     Time: 1130-1141 PT Time Calculation (min) (ACUTE ONLY): 11 min  Charges:  $Therapeutic Exercise: 8-22 mins                    Chesley Noon, PTA 05/11/20, 12:54  PM

## 2020-05-11 NOTE — Progress Notes (Signed)
PT Cancellation Note  Patient Details Name: Stacy Allen MRN: [id number] DOB: 12-12-1940   Cancelled Treatment:    Reason Eval/Treat Not Completed: Medical issues which prohibited therapy   Pt receiving blood transfusion this pm.  Will hold session and continue in AM as appropriate.   Chesley Noon 05/11/2020, 2:04 PM

## 2020-05-11 NOTE — Care Management Important Message (Signed)
Important Message  Patient Details  Name: MIKINZIE MACIEJEWSKI MRN: [id number] Date of Birth: 1941/06/24   Medicare Important Message Given:  Yes     Juliann Pulse A Ellysa Parrack 05/11/2020, 12:14 PM

## 2020-05-11 NOTE — TOC Progression Note (Signed)
Transition of Care [facility]) - Progression Note    Patient Details  Name: Stacy Allen MRN: [id number] Date of Birth: 07/18/1940  Transition of Care [facility]) CM/SW [place], RN Phone Number: 05/11/2020, 8:43 AM  Clinical Narrative:   RNCM met with patient at bedside to verify information for PASSR, once information verified placed call to [facility] and once a correction was made on their part was able to verify that patient does have existing PASSR of [id number]    Expected Discharge Plan: [place] Barriers to Discharge: Continued Medical Work up  Expected Discharge Plan and Services Expected Discharge Plan: [place]   Discharge Planning Services: CM Consult Post Acute Care Choice: [place] Living arrangements for the past 2 months: Single Family Home                                       Social Determinants of Health (SDOH) Interventions    Readmission Risk Interventions No flowsheet data found.

## 2020-05-11 NOTE — Progress Notes (Signed)
Patient voided 350 mls into canister. Notified provider. No I&O catheter necessary now.Will continue to monitor.

## 2020-05-11 NOTE — Progress Notes (Addendum)
A Provena vac dressing was placed on the patient's right hip incision by me this evening to reduce the serosanguinous drainage from her incision.   [name] RN and LPN assisted rolling the patient with Provena applied.   Provena functioning well.  No evidence of leaks.  Sponge well compressed.

## 2020-05-11 NOTE — Progress Notes (Signed)
Notified provider of bladder scan amount. Will come to unit to evaluate patient.

## 2020-05-11 NOTE — Progress Notes (Signed)
Subjective:  POD #4 s/p right hip hemiarthroplasty.   Patient reports right hip pain as mild.  Patient receiving blood transfusion today for hemoglobin of 6.9.  Patient states that she just does not feel well.  Patient remains afebrile with stable vital signs.  Objective:   VITALS:   Vitals:   05/11/20 0008 05/11/20 0441 05/11/20 0759 05/11/20 1200  BP: (!) 106/57 115/66 135/61 (!) 126/59  Pulse: 88 89 88 84  Resp: 20 20 18 18   Temp: 98.7 F (37.1 C) 98 F (36.7 C) 98.7 F (37.1 C) 98 F (36.7 C)  TempSrc: Oral Oral  Oral  SpO2: 96% 96% 95% 99%  Weight:      Height:        PHYSICAL EXAM: Right lower extremity: Serosanguineous drainage has saturated her right hip dressing. Neurovascular intact Sensation intact distally Intact pulses distally Dorsiflexion/Plantar flexion intact No cellulitis present Compartment soft  LABS  Results for orders placed or performed during the hospital encounter of 05/06/20 (from the past 24 hour(s))  CBC     Status: Abnormal   Collection Time: 05/11/20  5:02 AM  Result Value Ref Range   WBC 9.5 4.0 - 10.5 K/uL   RBC 2.31 (L) 3.87 - 5.11 MIL/uL   Hemoglobin 6.9 (L) 12.0 - 15.0 g/dL   HCT 21.3 (L) 36 - 46 %   MCV 92.2 80.0 - 100.0 fL   MCH 29.9 26.0 - 34.0 pg   MCHC 32.4 30.0 - 36.0 g/dL   RDW 16.2 (H) 11.5 - 15.5 %   Platelets 260 150 - 400 K/uL   nRBC 0.0 0.0 - 0.2 %  Basic metabolic panel     Status: Abnormal   Collection Time: 05/11/20  5:02 AM  Result Value Ref Range   Sodium 129 (L) 135 - 145 mmol/L   Potassium 4.1 3.5 - 5.1 mmol/L   Chloride 99 98 - 111 mmol/L   CO2 24 22 - 32 mmol/L   Glucose, Bld 119 (H) 70 - 99 mg/dL   BUN 38 (H) 8 - 23 mg/dL   Creatinine, Ser 0.85 0.44 - 1.00 mg/dL   Calcium 8.2 (L) 8.9 - 10.3 mg/dL   GFR, Estimated >60 >60 mL/min   Anion gap 6 5 - 15  ABO/Rh     Status: None   Collection Time: 05/11/20  5:02 AM  Result Value Ref Range   ABO/RH(D)      A POS Performed at [facility],  [place] [place], [place] [address]   Type and screen [place]     Status: None (Preliminary result)   Collection Time: 05/11/20  8:22 AM  Result Value Ref Range   ABO/RH(D) A POS    Antibody Screen NEG    Sample Expiration 05/14/2020,2359    Unit Number [id number]    Blood Component Type RED CELLS,LR    Unit division 00    Status of Unit ISSUED    Transfusion Status OK TO TRANSFUSE    Crossmatch Result      Compatible Performed at [facility], [address] [place], [place] [address]   Prepare RBC (crossmatch)     Status: None   Collection Time: 05/11/20  8:47 AM  Result Value Ref Range   Order Confirmation      ORDER PROCESSED BY BLOOD BANK Performed at [facility], [address] [place], [place] [address]     No results found.  Assessment/Plan: 4 Days  Post-Op   Principal Problem:   Closed right hip fracture (HCC) Active Problems:   Chronic pain syndrome   Essential hypertension   Hypothyroidism  Patient started on Ancef yesterday.  She continues to have serosanguineous drainage.  I am going to see about getting a Prevena dressing from the [place] for her.  Patient may continue physical therapy as tolerated.  Blood transfusion should help the patient feel better she probably does not feel well due to her anemia.  Continue posterior hip precautions.    Thornton Park , MD 05/11/2020, 3:08 PM

## 2020-05-11 NOTE — Plan of Care (Signed)
  Problem: Education: Goal: Knowledge of [facility] Education information/materials will improve Outcome: Progressing Goal: Emotional status will improve Outcome: Progressing Goal: Mental status will improve Outcome: Progressing Goal: Verbalization of understanding the information provided will improve Outcome: Progressing   Problem: Activity: Goal: Interest or engagement in activities will improve Outcome: Progressing Goal: Sleeping patterns will improve Outcome: Progressing   Problem: Coping: Goal: Ability to verbalize frustrations and anger appropriately will improve Outcome: Progressing Goal: Ability to demonstrate self-control will improve Outcome: Progressing   Problem: Health Behavior/Discharge Planning: Goal: Identification of resources available to assist in meeting health care needs will improve Outcome: Progressing Goal: Compliance with treatment plan for underlying cause of condition will improve Outcome: Progressing   Problem: Physical Regulation: Goal: Ability to maintain clinical measurements within normal limits will improve Outcome: Progressing   Problem: Education: Goal: Verbalization of understanding the information provided (i.e., activity precautions, restrictions, etc) will improve Outcome: Progressing Goal: Individualized Educational Video(s) Outcome: Progressing   Problem: Activity: Goal: Ability to ambulate and perform ADLs will improve Outcome: Progressing   Problem: Pain Management: Goal: Pain level will decrease Outcome: Progressing   Problem: Education: Goal: Knowledge of General Education information will improve Description: Including pain rating scale, medication(s)/side effects and non-pharmacologic comfort measures Outcome: Progressing   Problem: Health Behavior/Discharge Planning: Goal: Ability to manage health-related needs will improve Outcome: Progressing   Problem: Nutrition: Goal: Adequate nutrition will be  maintained Outcome: Progressing   Problem: Coping: Goal: Level of anxiety will decrease Outcome: Progressing   Problem: Elimination: Goal: Will not experience complications related to bowel motility Outcome: Progressing Goal: Will not experience complications related to urinary retention Outcome: Progressing

## 2020-05-11 NOTE — Progress Notes (Signed)
PROGRESS NOTE    Stacy Allen  RKY:[id number] DOB: Feb 28, 1941 DOA: 05/06/2020 PCP: [place]    Brief Narrative:  Stacy Allen is a 79 y.o. female with medical history significant for coronary artery disease, hypertension, dyslipidemia who presents to the ER at the request of her orthopedic surgeon for a right femoral neck fracture   11/12-c/o shoulder pain, xray negative for acute abn. 11/13- was getting in and out cath due to urine residual per nsg 216 on scan. Pt reports no BM so far  11/14-Per NP was notified by RN that patient's right hip surgical site is erythematous with surrounding erythema and copious drainage.  She was found to be afebrile and elevated blood pressures.  Orthopedic and I were made aware this am  11/15-hemoglobin 6.9 this a.m.   Consultants:   Orthopedics  Procedures:   Antimicrobials:       Subjective:.  Patient denies any shortness of breath, chest pain, dizziness or lightheadedness   Objective: Vitals:   05/10/20 1953 05/11/20 0008 05/11/20 0441 05/11/20 0759  BP: (!) 126/51 (!) 106/57 115/66 135/61  Pulse: 92 88 89 88  Resp: 17 20 20 18   Temp: 98.5 F (36.9 C) 98.7 F (37.1 C) 98 F (36.7 C) 98.7 F (37.1 C)  TempSrc: Oral Oral Oral   SpO2: 95% 96% 96% 95%  Weight:      Height:        Intake/Output Summary (Last 24 hours) at 05/11/2020 0843 Last data filed at 05/11/2020 0400 Gross per 24 hour  Intake 460 ml  Output 1950 ml  Net -1490 ml   Filed Weights   05/06/20 1129 05/06/20 2257  Weight: 102.1 kg 104.3 kg    Examination: Mildly pale, NAD comfortable CTA no wheeze rales rhonchi's regular S1-S2 no murmurs Soft benign positive bowel sounds Right hip with edema Alert oriented x3, grossly intact Mood affect appropriate in current setting   Data Reviewed: I have personally reviewed following labs and imaging studies  CBC: Recent Labs  Lab 05/08/20 0623 05/09/20 0733 05/09/20 2353  05/10/20 0614 05/11/20 0502  WBC 9.3 15.4* 14.0* 11.5* 9.5  HGB 9.3* 8.4* 7.1* 7.1* 6.9*  HCT 28.8* 25.5* 21.6* 21.5* 21.3*  MCV 92.0 90.1 89.6 89.2 92.2  PLT 238 236 218 222 315   Basic Metabolic Panel: Recent Labs  Lab 05/06/20 1131 05/06/20 1131 05/07/20 0509 05/08/20 0623 05/09/20 0555 05/10/20 0614 05/11/20 0502  NA 134*   < > 136 133* 130* 128* 129*  K 4.0  --  4.0 4.3 3.9  --  4.1  CL 97*  --  102 100 99  --  99  CO2 27  --  26 27 25   --  24  GLUCOSE 141*  --  96 123* 108*  --  119*  BUN 15  --  14 22 45*  --  38*  CREATININE 0.62  --  0.70 0.74 0.89  --  0.85  CALCIUM 8.6*  --  8.7* 8.5* 8.3*  --  8.2*   < > = values in this interval not displayed.   GFR: Estimated Creatinine Clearance: 64.1 mL/min (by C-G formula based on SCr of 0.85 mg/dL). Liver Function Tests: No results for input(s): AST, ALT, ALKPHOS, BILITOT, PROT, ALBUMIN in the last 168 hours. No results for input(s): LIPASE, AMYLASE in the last 168 hours. No results for input(s): AMMONIA in the last 168 hours. Coagulation Profile: Recent Labs  Lab 05/06/20 1131  INR 1.0  Cardiac Enzymes: No results for input(s): CKTOTAL, CKMB, CKMBINDEX, TROPONINI in the last 168 hours. BNP (last 3 results) No results for input(s): PROBNP in the last 8760 hours. HbA1C: No results for input(s): HGBA1C in the last 72 hours. CBG: No results for input(s): GLUCAP in the last 168 hours. Lipid Profile: No results for input(s): CHOL, HDL, LDLCALC, TRIG, CHOLHDL, LDLDIRECT in the last 72 hours. Thyroid Function Tests: No results for input(s): TSH, T4TOTAL, FREET4, T3FREE, THYROIDAB in the last 72 hours. Anemia Panel: No results for input(s): VITAMINB12, FOLATE, FERRITIN, TIBC, IRON, RETICCTPCT in the last 72 hours. Sepsis Labs: No results for input(s): PROCALCITON, LATICACIDVEN in the last 168 hours.  Recent Results (from the past 240 hour(s))  Respiratory Panel by RT PCR (Flu A&B, Covid) - Nasopharyngeal Swab      Status: None   Collection Time: 05/06/20 12:43 PM   Specimen: Nasopharyngeal Swab  Result Value Ref Range Status   SARS Coronavirus 2 by RT PCR NEGATIVE NEGATIVE Final    Comment: (NOTE) SARS-CoV-2 target nucleic acids are NOT DETECTED.  The SARS-CoV-2 RNA is generally detectable in upper respiratoy specimens during the acute phase of infection. The lowest concentration of SARS-CoV-2 viral copies this assay can detect is 131 copies/mL. A negative result does not preclude SARS-Cov-2 infection and should not be used as the sole basis for treatment or other patient management decisions. A negative result may occur with  improper specimen collection/handling, submission of specimen other than nasopharyngeal swab, presence of viral mutation(s) within the areas targeted by this assay, and inadequate number of viral copies (<131 copies/mL). A negative result must be combined with clinical observations, patient history, and epidemiological information. The expected result is Negative.  Fact Sheet for Patients:  PinkCheek.be  Fact Sheet for Healthcare Providers:  GravelBags.it  This test is no t yet approved or cleared by the [place] FDA and  has been authorized for detection and/or diagnosis of SARS-CoV-2 by FDA under an Emergency Use Authorization (EUA). This EUA will remain  in effect (meaning this test can be used) for the duration of the COVID-19 declaration under Section 564(b)(1) of the Act, 21 U.S.C. section 360bbb-3(b)(1), unless the authorization is terminated or revoked sooner.     Influenza A by PCR NEGATIVE NEGATIVE Final   Influenza B by PCR NEGATIVE NEGATIVE Final    Comment: (NOTE) The Xpert Xpress SARS-CoV-2/FLU/RSV assay is intended as an aid in  the diagnosis of influenza from Nasopharyngeal swab specimens and  should not be used as a sole basis for treatment. Nasal washings and  aspirates are  unacceptable for Xpert Xpress SARS-CoV-2/FLU/RSV  testing.  Fact Sheet for Patients: PinkCheek.be  Fact Sheet for Healthcare Providers: GravelBags.it  This test is not yet approved or cleared by the [place] FDA and  has been authorized for detection and/or diagnosis of SARS-CoV-2 by  FDA under an Emergency Use Authorization (EUA). This EUA will remain  in effect (meaning this test can be used) for the duration of the  Covid-19 declaration under Section 564(b)(1) of the Act, 21  U.S.C. section 360bbb-3(b)(1), unless the authorization is  terminated or revoked. Performed at [facility], [address] [place], [place] [address]          Radiology Studies: No results found.      Scheduled Meds: . Chlorhexidine Gluconate Cloth  6 each Topical Daily  . citalopram  40 mg Oral Daily  . docusate sodium  100 mg Oral BID  . enalapril  20 mg Oral Daily  . enoxaparin (LOVENOX) injection  0.5 mg/kg Subcutaneous Q24H  . ferrous sulfate  325 mg Oral BID  . fluticasone  2 spray Each Nare Daily  . gabapentin  600 mg Oral TID  . levothyroxine  137 mcg Oral QAC breakfast  . lidocaine  1 patch Transdermal Q24H  . loratadine  10 mg Oral Daily  . metoprolol tartrate  12.5 mg Oral BID  . mirtazapine  15 mg Oral QHS  . pantoprazole  40 mg Oral Daily  . polyethylene glycol  17 g Oral Daily  . polyvinyl alcohol   Both Eyes Daily  . Ensure Max Protein  11 oz Oral Daily  . senna  1 tablet Oral BID  . [START ON 05/26/2020] Vitamin D (Ergocalciferol)  50,000 Units Oral Q30 days   Continuous Infusions: .  ceFAZolin (ANCEF) IV 2 g (05/11/20 0532)    Assessment & Plan:   Principal Problem:   Closed right hip fracture [facility]) Active Problems:   Chronic pain syndrome   Essential hypertension   Hypothyroidism   Closed right hip fracture-Status post fall which appears to be mechanical S/p right hip  hemiarthroplasty postop #2 by Dr. Gretta Arab on 11/11  Following, dressing change by Dr. Zandra Abts on 11/14.  No evidence of purulent drainage.  Serosanguinous drainage.Compartment compressible 11/15 -leukocytosis has improved.  Ortho had recommended IV Kefzol empirically given her drainage to prevent possible wound infection, will continue Pain control Bowel regimen-patient did report having bowel movement yesterday SNF pending   Post op acute blood loss anemia- Hg 6.9 Discussed this with patient she is agreeable to transfusion We will transfuse 1 unit packed red blood cell, post transfusion lasix Posttransfusion H&H    Hypertension BP on low side.  With low hemoglobin will discontinue ACE inhibitor's altogether  Continue beta-blockers  Monitor   Leukocytosis-likely reactive/stress induced. Improved, afebrile   Hyponatremia- etiology unclear. Due to pain, or hypovolemia as she does appear to be on dry side Na down to 128, is there SIADH ? Does not appear to be volume overloaded, will monitor.  11/15- ivf was d/c'd yesterday.  Sodium increasing to 129.   We will continue to monitor    Hypothyroidism Continue Synthroid   Depression and anxiety Continue citalopram Continue with Klonopin      Hypothyroidism Continue Synthroid   GERD Continue Protonix    DVT prophylaxis: lovenox Code Status: Full Family Communication: None at bedside  Status is: Inpatient  Remains inpatient appropriate because: Unsafe discharge, needs ivf.   Dispo: The patient is from: Home              Anticipated d/c is to: SNF              Anticipated d/c date is: 2 days              Patient currently is not medically stable to d/c.  Electrolytes imbalance, SNF pending, getting IV antibiotics, getting transfusion today      LOS: 5 days   Time spent: 35 minutes with more than 50% on [place], MD [facility] Pager [phone]  If 7PM-7AM, please contact  night-coverage [email or web address] Password [facility] 05/11/2020, 8:43 AM

## 2020-05-11 NOTE — Progress Notes (Signed)
Assumed care for patient from [place]yl[place], LPN 

## 2020-05-12 ENCOUNTER — Encounter: Payer: Self-pay | Admitting: Orthopedic Surgery

## 2020-05-12 DIAGNOSIS — S72001K Fracture of unspecified part of neck of right femur, subsequent encounter for closed fracture with nonunion: Secondary | ICD-10-CM | POA: Diagnosis not present

## 2020-05-12 DIAGNOSIS — I10 Essential (primary) hypertension: Secondary | ICD-10-CM | POA: Diagnosis not present

## 2020-05-12 DIAGNOSIS — G894 Chronic pain syndrome: Secondary | ICD-10-CM | POA: Diagnosis not present

## 2020-05-12 DIAGNOSIS — E039 Hypothyroidism, unspecified: Secondary | ICD-10-CM | POA: Diagnosis not present

## 2020-05-12 LAB — BPAM RBC
Blood Product Expiration Date: 202112082359
ISSUE DATE / TIME: 202111151206
Unit Type and Rh: 6200

## 2020-05-12 LAB — CBC WITH DIFFERENTIAL/PLATELET
Abs Immature Granulocytes: 0.04 10*3/uL (ref 0.00–0.07)
Basophils Absolute: 0.1 10*3/uL (ref 0.0–0.1)
Basophils Relative: 1 %
Eosinophils Absolute: 0.4 10*3/uL (ref 0.0–0.5)
Eosinophils Relative: 5 %
HCT: 22.4 % — ABNORMAL LOW (ref 36.0–46.0)
Hemoglobin: 7.4 g/dL — ABNORMAL LOW (ref 12.0–15.0)
Immature Granulocytes: 1 %
Lymphocytes Relative: 14 %
Lymphs Abs: 1.1 10*3/uL (ref 0.7–4.0)
MCH: 30 pg (ref 26.0–34.0)
MCHC: 33 g/dL (ref 30.0–36.0)
MCV: 90.7 fL (ref 80.0–100.0)
Monocytes Absolute: 1.2 10*3/uL — ABNORMAL HIGH (ref 0.1–1.0)
Monocytes Relative: 16 %
Neutro Abs: 4.9 10*3/uL (ref 1.7–7.7)
Neutrophils Relative %: 63 %
Platelets: 281 10*3/uL (ref 150–400)
RBC: 2.47 MIL/uL — ABNORMAL LOW (ref 3.87–5.11)
RDW: 15.3 % (ref 11.5–15.5)
WBC: 7.5 10*3/uL (ref 4.0–10.5)
nRBC: 0 % (ref 0.0–0.2)

## 2020-05-12 LAB — TYPE AND SCREEN
ABO/RH(D): A POS
Antibody Screen: NEGATIVE
Unit division: 0

## 2020-05-12 LAB — BASIC METABOLIC PANEL
Anion gap: 8 (ref 5–15)
BUN: 21 mg/dL (ref 8–23)
CO2: 28 mmol/L (ref 22–32)
Calcium: 8 mg/dL — ABNORMAL LOW (ref 8.9–10.3)
Chloride: 97 mmol/L — ABNORMAL LOW (ref 98–111)
Creatinine, Ser: 0.71 mg/dL (ref 0.44–1.00)
GFR, Estimated: >60 mL/min (ref >60)
Glucose, Bld: 105 mg/dL — ABNORMAL HIGH (ref 70–99)
Potassium: 4.6 mmol/L (ref 3.5–5.1)
Sodium: 133 mmol/L — ABNORMAL LOW (ref 135–145)

## 2020-05-12 MED ORDER — CALCIUM CARBONATE 1250 (500 CA) MG PO TABS
500.0000 mg | ORAL_TABLET | Freq: Every day | ORAL | Status: DC
  Administered 2020-05-13 – 2020-05-14 (×2): 500 mg via ORAL
  Filled 2020-05-12 (×3): qty 1

## 2020-05-12 NOTE — Progress Notes (Signed)
Subjective:  POD #5 s/p right hip hemiarthroplasty.   Patient reports right hip pain as mild to moderate.  Patient had Prevena VAC dressing applied to her right hip wound last night due to persistent serosanguineous drainage.  Objective:   VITALS:   Vitals:   05/12/20 0751 05/12/20 0800 05/12/20 1200 05/12/20 1600  BP: 136/70 136/70 132/67 136/73  Pulse: 84 84 89 96  Resp: 16   (!) 21  Temp: 98.1 F (36.7 C) 99.1 F (37.3 C) 98 F (36.7 C) 99 F (37.2 C)  TempSrc: Oral Oral Oral Oral  SpO2: 100% 100% 95% 99%  Weight:      Height:        PHYSICAL EXAM: Right lower extremity: Patient has mild erythema in the right thigh which is likely a reaction to her postoperative dressings.  Patient states she is sensitive to all adhesive dressings.  Patient now has a [place] on. Neurovascular intact Sensation intact distally Intact pulses distally Dorsiflexion/Plantar flexion intact No cellulitis present Compartment soft  LABS  Results for orders placed or performed during the hospital encounter of 05/06/20 (from the past 24 hour(s))  CBC with Differential/Platelet     Status: Abnormal   Collection Time: 05/12/20  7:35 AM  Result Value Ref Range   WBC 7.5 4.0 - 10.5 K/uL   RBC 2.47 (L) 3.87 - 5.11 MIL/uL   Hemoglobin 7.4 (L) 12.0 - 15.0 g/dL   HCT 22.4 (L) 36 - 46 %   MCV 90.7 80.0 - 100.0 fL   MCH 30.0 26.0 - 34.0 pg   MCHC 33.0 30.0 - 36.0 g/dL   RDW 15.3 11.5 - 15.5 %   Platelets 281 150 - 400 K/uL   nRBC 0.0 0.0 - 0.2 %   Neutrophils Relative % 63 %   Neutro Abs 4.9 1.7 - 7.7 K/uL   Lymphocytes Relative 14 %   Lymphs Abs 1.1 0.7 - 4.0 K/uL   Monocytes Relative 16 %   Monocytes Absolute 1.2 (H) 0.1 - 1.0 K/uL   Eosinophils Relative 5 %   Eosinophils Absolute 0.4 0.0 - 0.5 K/uL   Basophils Relative 1 %   Basophils Absolute 0.1 0.0 - 0.1 K/uL   Immature Granulocytes 1 %   Abs Immature Granulocytes 0.04 0.00 - 0.07 K/uL  Basic metabolic panel     Status: Abnormal    Collection Time: 05/12/20  7:35 AM  Result Value Ref Range   Sodium 133 (L) 135 - 145 mmol/L   Potassium 4.6 3.5 - 5.1 mmol/L   Chloride 97 (L) 98 - 111 mmol/L   CO2 28 22 - 32 mmol/L   Glucose, Bld 105 (H) 70 - 99 mg/dL   BUN 21 8 - 23 mg/dL   Creatinine, Ser 0.71 0.44 - 1.00 mg/dL   Calcium 8.0 (L) 8.9 - 10.3 mg/dL   GFR, Estimated >60 >60 mL/min   Anion gap 8 5 - 15    No results found.  Assessment/Plan: 5 Days Post-Op   Principal Problem:   Closed right hip fracture (HCC) Active Problems:   Chronic pain syndrome   Essential hypertension   Hypothyroidism  Patient is no longer having significant serosanguineous drainage.  She will remain on IV Kefzol until serosanguineous drainage completely stops.  Patient has a WBC of 7.5 today.  She is afebrile and there is no signs of postoperative wound infection. Patient is on Lovenox for DVT prophylaxis, but Lovenox may need to be stopped if she has persistent serosanguineous  drainage.   Thornton [place] , MD 05/12/2020, 7:29 PM

## 2020-05-12 NOTE — TOC Progression Note (Addendum)
Transition of Care [facility]) - Progression Note    Patient Details  Name: Stacy Allen MRN: [id number] Date of Birth: 1940-10-09  Transition of Care [facility]) CM/SW [place], RN Phone Number: 05/12/2020, 8:44 AM  Clinical Narrative:  RNCM met with patient at bedside to present bed offers. Provided patient with offers for both [facility] and [facility]. Patient reports that her mother was at [facility] and therefor she does not want to go there. Patient is agreeable to accept bed offer from [facility].  RNCM accepted bed in hub, informed facility, and started insurance auth through [place] with a reference number of [id number].    10:20am- RNCM received call back from [facility], they are requesting updated clinicals after today including updated PT notes and progress notes indicating patient's readiness.     Expected Discharge Plan: [place] Barriers to Discharge: Continued Medical Work up  Expected Discharge Plan and Services Expected Discharge Plan: [place]   Discharge Planning Services: CM Consult Post Acute Care Choice: [place] Living arrangements for the past 2 months: Single Family Home                                       Social Determinants of Health (SDOH) Interventions    Readmission Risk Interventions No flowsheet data found.

## 2020-05-12 NOTE — Progress Notes (Signed)
Physical Therapy Treatment Patient Details Name: Stacy Allen MRN: [id number] DOB: Oct 18, 1940 Today's Date: 05/12/2020    History of Present Illness Pt is a 79 y.o. female with medical history significant for coronary artery disease, hypertension, fibromyalgia, dyslipidemia who presents to the ER at the request of her orthopedic surgeon for a right femoral neck fracture, underwent R hip hemiarthroplasty.    PT Comments    Pt is making good progress this date. Wound vac applied yesterday, educated on upkeep. Very motivated to get OOB and then able to transfer from elevated bed and take steps to recliner. Good endurance with seated there-ex. Will continue to progress as able.   Follow Up Recommendations  SNF     Equipment Recommendations       Recommendations for Other Services       Precautions / Restrictions Precautions Precautions: Posterior Hip Precaution Booklet Issued: No Restrictions Weight Bearing Restrictions: Yes RLE Weight Bearing: Weight bearing as tolerated    Mobility  Bed Mobility Overal bed mobility: Needs Assistance Bed Mobility: Supine to Sit     Supine to sit: Mod assist     General bed mobility comments: needs cues for sequencing and to maintain hip precautions. ONce seated, able to sit with upright posture  Transfers Overall transfer level: Needs assistance Equipment used: Rolling walker (2 wheeled) Transfers: Sit to/from [facility] Sit to Stand: Mod assist;From elevated surface         General transfer comment: cues for safe technique. Upright posture noted. Slow transfer  Ambulation/Gait Ambulation/Gait assistance: Min assist Gait Distance (Feet): 3 Feet Assistive device: Rolling walker (2 wheeled) Gait Pattern/deviations: Step-to pattern     General Gait Details: slow and steady steps over from bed to recliner. Tends to slide R foot despite cues. Fatigues quickly   Physiological scientist [place] (Stroke Patients Only)       Balance Overall balance assessment: Needs assistance Sitting-balance support: Feet supported;Bilateral upper extremity supported Sitting balance-Leahy Scale: Fair     Standing balance support: Bilateral upper extremity supported Standing balance-Leahy Scale: Good                              Cognition Arousal/Alertness: Awake/alert Behavior During Therapy: WFL for tasks assessed/performed Overall Cognitive Status: Within Functional Limits for tasks assessed                                        Exercises Other Exercises Other Exercises: Seated ther-ex performed in recliner including AP, quad sets, hip abd/add, and LAQ. All ther0ex performed x 12 reps with min assist.    General Comments General comments (skin integrity, edema, etc.): requesting purewick to be replaced despite education      Pertinent Vitals/Pain Pain Assessment: 0-10 Pain Score: 6  Pain Location: R hip Pain Descriptors / Indicators: Aching;Grimacing;Moaning;Guarding Pain Intervention(s): Limited activity within patient's tolerance;Repositioned    Home Living                      Prior Function            PT Goals (current goals can now be found in the care plan section) Acute Rehab PT Goals Patient Stated Goal: to have less pain PT Goal Formulation:  With patient Time For Goal Achievement: 05/22/20 Potential to Achieve Goals: Good Progress towards PT goals: Progressing toward goals    Frequency    BID      PT Plan Current plan remains appropriate    Co-evaluation              AM-PAC PT "6 Clicks" Mobility   Outcome Measure  Help needed turning from your back to your side while in a flat bed without using bedrails?: A Lot Help needed moving from lying on your back to sitting on the side of a flat bed without using bedrails?: A Lot Help needed moving to and from a bed to a chair (including a  wheelchair)?: A Lot Help needed standing up from a chair using your arms (e.g., wheelchair or bedside chair)?: A Lot Help needed to walk in hospital room?: A Lot Help needed climbing 3-5 steps with a railing? : Total 6 Click Score: 11    End of Session Equipment Utilized During Treatment: Gait belt Activity Tolerance: Patient tolerated treatment well Patient left: in chair;with chair alarm set Nurse Communication: Mobility status PT Visit Diagnosis: Other abnormalities of gait and mobility (R26.89);Muscle weakness (generalized) (M62.81);Difficulty in walking, not elsewhere classified (R26.2)     Time: 0350-0938 PT Time Calculation (min) (ACUTE ONLY): 33 min  Charges:  $Gait Training: 8-22 mins $Therapeutic Exercise: 8-22 mins                     Greggory Stallion, PT, DPT [phone]    Stacy Allen 05/12/2020, 11:56 AM

## 2020-05-12 NOTE — Progress Notes (Signed)
PROGRESS NOTE    Stacy Allen  HCW:[id number] DOB: [birth year] DOA: 05/06/2020 PCP: [facility]    Brief Narrative:  Stacy Allen is a 79 y.o. female with medical history significant for coronary artery disease, hypertension, dyslipidemia who presents to the ER at the request of her orthopedic surgeon for a right femoral neck fracture   11/12-c/o shoulder pain, xray negative for acute abn. 11/13- was getting in and out cath due to urine residual per nsg 216 on scan. Pt reports no BM so far  11/14-Per NP was notified by RN that patient's right hip surgical site is erythematous with surrounding erythema and copious drainage.  She was found to be afebrile and elevated blood pressures.  Orthopedic and I were made aware this am  11/15-hemoglobin 6.9 this a.m. 11/16-wound vac was placed by orthopedics on 11/15. Hg post transfusion 7.4  Consultants:   Orthopedics  Procedures:   Antimicrobials:       Subjective:[name]  Pt has no new complaints. Doing better today. Pain controlled. No sob, cp , abd pain. No BM today   Objective: Vitals:   05/12/20 0345 05/12/20 0751 05/12/20 0800 05/12/20 1200  BP: 135/68 136/70 136/70 132/67  Pulse: 85 84 84 89  Resp: 16 16    Temp: 98.1 F (36.7 C) 98.1 F (36.7 C) 99.1 F (37.3 C) 98 F (36.7 C)  TempSrc:  Oral Oral Oral  SpO2: 96% 100% 100% 95%  Weight:      Height:        Intake/Output Summary (Last 24 hours) at 05/12/2020 1413 Last data filed at 05/12/2020 1003 Gross per 24 hour  Intake 850 ml  Output 2400 ml  Net -1550 ml   Filed Weights   05/06/20 1129 05/06/20 2257  Weight: 102.1 kg 104.3 kg    Examination: Well color on face, NAD CTA, no wheeze rales rhonchi's Regular S1-S2 no gallops Soft benign positive bowel sounds Right thigh wound VAC in place with bloody drainage + edema Left lower extremity with no edema Alert oriented x3 Mood and affect appropriate in current setting   Data Reviewed:  I have personally reviewed following labs and imaging studies  CBC: Recent Labs  Lab 05/09/20 0733 05/09/20 2353 05/10/20 0614 05/11/20 0502 05/12/20 0735  WBC 15.4* 14.0* 11.5* 9.5 7.5  NEUTROABS  --   --   --   --  4.9  HGB 8.4* 7.1* 7.1* 6.9* 7.4*  HCT 25.5* 21.6* 21.5* 21.3* 22.4*  MCV 90.1 89.6 89.2 92.2 90.7  PLT 236 218 222 260 176   Basic Metabolic Panel: Recent Labs  Lab 05/07/20 0509 05/07/20 0509 05/08/20 0623 05/09/20 0555 05/10/20 0614 05/11/20 0502 05/12/20 0735  NA 136   < > 133* 130* 128* 129* 133*  K 4.0  --  4.3 3.9  --  4.1 4.6  CL 102  --  100 99  --  99 97*  CO2 26  --  27 25  --  24 28  GLUCOSE 96  --  123* 108*  --  119* 105*  BUN 14  --  22 45*  --  38* 21  CREATININE 0.70  --  0.74 0.89  --  0.85 0.71  CALCIUM 8.7*  --  8.5* 8.3*  --  8.2* 8.0*   < > = values in this interval not displayed.   GFR: Estimated Creatinine Clearance: 68.1 mL/min (by C-G formula based on SCr of 0.71 mg/dL). Liver Function Tests: No results  for input(s): AST, ALT, ALKPHOS, BILITOT, PROT, ALBUMIN in the last 168 hours. No results for input(s): LIPASE, AMYLASE in the last 168 hours. No results for input(s): AMMONIA in the last 168 hours. Coagulation Profile: Recent Labs  Lab 05/06/20 1131  INR 1.0   Cardiac Enzymes: No results for input(s): CKTOTAL, CKMB, CKMBINDEX, TROPONINI in the last 168 hours. BNP (last 3 results) No results for input(s): PROBNP in the last 8760 hours. HbA1C: No results for input(s): HGBA1C in the last 72 hours. CBG: No results for input(s): GLUCAP in the last 168 hours. Lipid Profile: No results for input(s): CHOL, HDL, LDLCALC, TRIG, CHOLHDL, LDLDIRECT in the last 72 hours. Thyroid Function Tests: No results for input(s): TSH, T4TOTAL, FREET4, T3FREE, THYROIDAB in the last 72 hours. Anemia Panel: No results for input(s): VITAMINB12, FOLATE, FERRITIN, TIBC, IRON, RETICCTPCT in the last 72 hours. Sepsis Labs: No results for  input(s): PROCALCITON, LATICACIDVEN in the last 168 hours.  Recent Results (from the past 240 hour(s))  Respiratory Panel by RT PCR (Flu A&B, Covid) - Nasopharyngeal Swab     Status: None   Collection Time: 05/06/20 12:43 PM   Specimen: Nasopharyngeal Swab  Result Value Ref Range Status   SARS Coronavirus 2 by RT PCR NEGATIVE NEGATIVE Final    Comment: (NOTE) SARS-CoV-2 target nucleic acids are NOT DETECTED.  The SARS-CoV-2 RNA is generally detectable in upper respiratoy specimens during the acute phase of infection. The lowest concentration of SARS-CoV-2 viral copies this assay can detect is 131 copies/mL. A negative result does not preclude SARS-Cov-2 infection and should not be used as the sole basis for treatment or other patient management decisions. A negative result may occur with  improper specimen collection/handling, submission of specimen other than nasopharyngeal swab, presence of viral mutation(s) within the areas targeted by this assay, and inadequate number of viral copies (<131 copies/mL). A negative result must be combined with clinical observations, patient history, and epidemiological information. The expected result is Negative.  Fact Sheet for Patients:  PinkCheek.be  Fact Sheet for Healthcare Providers:  GravelBags.it  This test is no t yet approved or cleared by the [place] FDA and  has been authorized for detection and/or diagnosis of SARS-CoV-2 by FDA under an Emergency Use Authorization (EUA). This EUA will remain  in effect (meaning this test can be used) for the duration of the COVID-19 declaration under Section 564(b)(1) of the Act, 21 U.S.C. section 360bbb-3(b)(1), unless the authorization is terminated or revoked sooner.     Influenza A by PCR NEGATIVE NEGATIVE Final   Influenza B by PCR NEGATIVE NEGATIVE Final    Comment: (NOTE) The Xpert Xpress SARS-CoV-2/FLU/RSV assay is intended  as an aid in  the diagnosis of influenza from Nasopharyngeal swab specimens and  should not be used as a sole basis for treatment. Nasal washings and  aspirates are unacceptable for Xpert Xpress SARS-CoV-2/FLU/RSV  testing.  Fact Sheet for Patients: PinkCheek.be  Fact Sheet for Healthcare Providers: GravelBags.it  This test is not yet approved or cleared by the [place] FDA and  has been authorized for detection and/or diagnosis of SARS-CoV-2 by  FDA under an Emergency Use Authorization (EUA). This EUA will remain  in effect (meaning this test can be used) for the duration of the  Covid-19 declaration under Section 564(b)(1) of the Act, 21  U.S.C. section 360bbb-3(b)(1), unless the authorization is  terminated or revoked. Performed at [facility], [address], [place], [place] [address]  Radiology Studies: No results found.      Scheduled Meds: . [START ON 05/13/2020] calcium carbonate  500 mg of elemental calcium Oral Q breakfast  . Chlorhexidine Gluconate Cloth  6 each Topical Daily  . citalopram  40 mg Oral Daily  . docusate sodium  100 mg Oral BID  . enoxaparin (LOVENOX) injection  0.5 mg/kg Subcutaneous Q24H  . ferrous sulfate  325 mg Oral BID  . fluticasone  2 spray Each Nare Daily  . gabapentin  600 mg Oral TID  . levothyroxine  137 mcg Oral QAC breakfast  . lidocaine  1 patch Transdermal Q24H  . loratadine  10 mg Oral Daily  . metoprolol tartrate  12.5 mg Oral BID  . mirtazapine  15 mg Oral QHS  . pantoprazole  40 mg Oral Daily  . polyethylene glycol  17 g Oral Daily  . polyvinyl alcohol   Both Eyes Daily  . Ensure Max Protein  11 oz Oral Daily  . senna  1 tablet Oral BID  . [START ON 05/26/2020] Vitamin D (Ergocalciferol)  50,000 Units Oral Q30 days   Continuous Infusions: .  ceFAZolin (ANCEF) IV 2 g (05/12/20 5498)    Assessment & Plan:   Principal Problem:    Closed right hip fracture [facility]) Active Problems:   Chronic pain syndrome   Essential hypertension   Hypothyroidism   Closed right hip fracture-Status post fall which appears to be mechanical S/p right hip hemiarthroplasty postop #2 by Dr. Gretta Arab on 11/11  Following, dressing change by Dr. Zandra Abts on 11/14.  No evidence of purulent drainage.  Serosanguinous drainage.Compartment compressible 111/16-Prevena VAC was placed on 11/15 by Ortho 1/15 -leukocytosis has improved.  Ortho had recommended IV Kefzol empirically given she was having drainage to prevent possible wound infection, will f/u with orthopedics to see how long they want to continue abx Pain control Bowel regimen Staff pending   Post op acute blood loss anemia- Hg 6.9 Is post 1 unit packed red blood cell on 11/15  Hemoglobin 7.4 today  Continue to monitor, transfuse when hemoglobin less than 7     Hypertension Stable.   Lisinopril was discontinued due to low BP  Continue beta-blockers     Leukocytosis-likely reactive/stress induced. Afebrile Leukocytosis improved   Hyponatremia- etiology unclear. Due to pain, or hypovolemia as she does appear to be on dry side Na down to 128, is there SIADH ? Does not appear to be volume overloaded, will monitor.  11/16-improved. Na 133. IVF was d/c'd and received lasix post transfusion Continue to monitor.   Hypothyroidism Continue Synthroid   Depression and anxiety Continue citalopram Continue with Klonopin      Hypothyroidism Continue Synthroid   GERD Continue Protonix    DVT prophylaxis: lovenox Code Status: Full Family Communication: None at bedside  Status is: Inpatient  Remains inpatient appropriate because: Unsafe discharge, needs iv abx, appropriate for severity of illness to be hospitalized  Dispo: The patient is from: Home              Anticipated d/c is to: SNF              Anticipated d/c date is: 1-2 days              Patient  currently is not medically stable to d/c.  Had wound vac replaced, need clearance from orthopedics for dc, monitor cbc as she may require transfusion, snf pending    LOS: 6 days   Time spent: 59  minutes with more than 50% on [place], MD [facility] Pager [phone]  If 7PM-7AM, please contact night-coverage [email or web address] Password [email or web address] 05/12/2020, 2:13 PM

## 2020-05-12 NOTE — Progress Notes (Signed)
Physical Therapy Treatment Patient Details Name: Stacy Allen MRN: [id number] DOB: 06/27/1941 Today's Date: 05/12/2020    History of Present Illness Pt is a 79 y.o. female with medical history significant for coronary artery disease, hypertension, fibromyalgia, dyslipidemia who presents to the ER at the request of her orthopedic surgeon for a right femoral neck fracture, underwent R hip hemiarthroplasty.    PT Comments    Pt is very fatigued this session, received in recliner requesting to return to bed. Able to recall 3/3 post hip precautions. Fatigues quickly with ambulation back to bed. Becomes nauseated with mobility. Assisted to transition to bed and symptoms improved. Agreeable to perform there-ex. Reviewed written HEP.   Follow Up Recommendations  SNF     Equipment Recommendations       Recommendations for Other Services       Precautions / Restrictions Precautions Precautions: Posterior Hip Precaution Booklet Issued: Yes (comment) Restrictions Weight Bearing Restrictions: Yes RLE Weight Bearing: Weight bearing as tolerated    Mobility  Bed Mobility Overal bed mobility: Needs Assistance Bed Mobility: Sit to Supine     Supine to sit: Mod assist Sit to supine: Mod assist   General bed mobility comments: improved technique this session. Able to lateral scoot up towards [facility] prior to transfer for improved position. Needs assist for repositioning  Transfers Overall transfer level: Needs assistance Equipment used: Rolling walker (2 wheeled) Transfers: Sit to/from [employer] Transfers Sit to Stand: Min assist         General transfer comment: Cues for pushing from seated surface. ONce standing, demonstrates forward flexed posture  Ambulation/Gait Ambulation/Gait assistance: Min assist Gait Distance (Feet): 3 Feet Assistive device: Rolling walker (2 wheeled) Gait Pattern/deviations: Step-to pattern     General Gait Details: fatigues quickly this  session. Small steps noted.    Stairs             Wheelchair Mobility    Modified Rankin (Stroke Patients Only)       Balance Overall balance assessment: Needs assistance Sitting-balance support: Feet supported;Bilateral upper extremity supported Sitting balance-Leahy Scale: Fair     Standing balance support: Bilateral upper extremity supported Standing balance-Leahy Scale: Good                              Cognition Arousal/Alertness: Awake/alert Behavior During Therapy: WFL for tasks assessed/performed Overall Cognitive Status: Within Functional Limits for tasks assessed                                        Exercises Other Exercises Other Exercises: Supine ther-ex performed on B LE including AP, quad sets, glut sets, SAQ, and heel slides. All ther-ex perfomred x mod assist    General Comments General comments (skin integrity, edema, etc.): reviewed post hip precautions, recall 3/3      Pertinent Vitals/Pain Pain Assessment: 0-10 Pain Score: 8  Pain Location: R hip Pain Descriptors / Indicators: Aching;Grimacing;Moaning;Guarding Pain Intervention(s): Limited activity within patient's tolerance;Repositioned    Home Living                      Prior Function            PT Goals (current goals can now be found in the care plan section) Acute Rehab PT Goals Patient Stated Goal: to have less pain PT  Goal Formulation: With patient Time For Goal Achievement: 05/22/20 Potential to Achieve Goals: Good Progress towards PT goals: Progressing toward goals    Frequency    BID      PT Plan Current plan remains appropriate    Co-evaluation              AM-PAC PT "6 Clicks" Mobility   Outcome Measure  Help needed turning from your back to your side while in a flat bed without using bedrails?: A Lot Help needed moving from lying on your back to sitting on the side of a flat bed without using bedrails?: A  Lot Help needed moving to and from a bed to a chair (including a wheelchair)?: A Lot Help needed standing up from a chair using your arms (e.g., wheelchair or bedside chair)?: A Lot Help needed to walk in hospital room?: A Lot Help needed climbing 3-5 steps with a railing? : Total 6 Click Score: 11    End of Session Equipment Utilized During Treatment: Gait belt Activity Tolerance: Patient tolerated treatment well Patient left: in bed;with bed alarm set;with SCD's reapplied Nurse Communication: Mobility status PT Visit Diagnosis: Other abnormalities of gait and mobility (R26.89);Muscle weakness (generalized) (M62.81);Difficulty in walking, not elsewhere classified (R26.2)     Time: 6761-9509 PT Time Calculation (min) (ACUTE ONLY): 41 min  Charges:  $Gait Training: 8-22 mins $Therapeutic Exercise: 23-37 mins                     Greggory Stallion, [place], DPT [phone]    Othell Jaime 05/12/2020, 2:18 PM

## 2020-05-12 NOTE — Plan of Care (Signed)
  Problem: Education: Goal: Knowledge of [facility] Education information/materials will improve Outcome: Progressing Goal: Emotional status will improve Outcome: Progressing Goal: Mental status will improve Outcome: Progressing Goal: Verbalization of understanding the information provided will improve Outcome: Progressing   Problem: Activity: Goal: Interest or engagement in activities will improve Outcome: Progressing Goal: Sleeping patterns will improve Outcome: Progressing   Problem: Coping: Goal: Ability to verbalize frustrations and anger appropriately will improve Outcome: Progressing Goal: Ability to demonstrate self-control will improve Outcome: Progressing   Problem: Health Behavior/Discharge Planning: Goal: Identification of resources available to assist in meeting health care needs will improve Outcome: Progressing Goal: Compliance with treatment plan for underlying cause of condition will improve Outcome: Progressing   Problem: Physical Regulation: Goal: Ability to maintain clinical measurements within normal limits will improve Outcome: Progressing   Problem: Safety: Goal: Periods of time without injury will increase Outcome: Progressing   Problem: Education: Goal: Verbalization of understanding the information provided (i.e., activity precautions, restrictions, etc) will improve Outcome: Progressing Goal: Individualized Educational Video(s) Outcome: Progressing   Problem: Clinical Measurements: Goal: Postoperative complications will be avoided or minimized Outcome: Progressing   Problem: Self-Concept: Goal: Ability to maintain and perform role responsibilities to the fullest extent possible will improve Outcome: Progressing   Problem: Pain Management: Goal: Pain level will decrease Outcome: Progressing   Problem: Education: Goal: Knowledge of General Education information will improve Description: Including pain rating scale,  medication(s)/side effects and non-pharmacologic comfort measures Outcome: Progressing   Problem: Health Behavior/Discharge Planning: Goal: Ability to manage health-related needs will improve Outcome: Progressing   Problem: Clinical Measurements: Goal: Ability to maintain clinical measurements within normal limits will improve Outcome: Progressing Goal: Will remain free from infection Outcome: Progressing Goal: Diagnostic test results will improve Outcome: Progressing Goal: Respiratory complications will improve Outcome: Progressing Goal: Cardiovascular complication will be avoided Outcome: Progressing

## 2020-05-12 NOTE — Progress Notes (Signed)
OT Cancellation Note  Patient Details Name: Stacy Allen MRN: [id number] DOB: [birth year]   Cancelled Treatment:    Reason Eval/Treat Not Completed: Patient declined, no reason specified. Pt reports having just gotten back to bed with PT and declined OT intervention at this time.   Darleen Crocker, MS, OTR/L , CBIS ascom [phone]  05/12/20, 3:40 PM   05/12/2020, 3:40 PM

## 2020-05-13 DIAGNOSIS — S72001K Fracture of unspecified part of neck of right femur, subsequent encounter for closed fracture with nonunion: Secondary | ICD-10-CM | POA: Diagnosis not present

## 2020-05-13 DIAGNOSIS — M25511 Pain in right shoulder: Secondary | ICD-10-CM

## 2020-05-13 DIAGNOSIS — E039 Hypothyroidism, unspecified: Secondary | ICD-10-CM | POA: Diagnosis not present

## 2020-05-13 DIAGNOSIS — I10 Essential (primary) hypertension: Secondary | ICD-10-CM | POA: Diagnosis not present

## 2020-05-13 LAB — FERRITIN: Ferritin: 149 ng/mL (ref 11–307)

## 2020-05-13 LAB — RETICULOCYTES
Immature Retic Fract: 35.6 % — ABNORMAL HIGH (ref 2.3–15.9)
RBC.: 2.52 MIL/uL — ABNORMAL LOW (ref 3.87–5.11)
Retic Count, Absolute: 81.6 10*3/uL (ref 19.0–186.0)
Retic Ct Pct: 3.2 % — ABNORMAL HIGH (ref 0.4–3.1)

## 2020-05-13 LAB — VITAMIN B12: Vitamin B-12: 259 pg/mL (ref 180–914)

## 2020-05-13 LAB — IRON AND TIBC
Iron: 24 ug/dL — ABNORMAL LOW (ref 28–170)
Saturation Ratios: 13 % (ref 10.4–31.8)
TIBC: 188 ug/dL — ABNORMAL LOW (ref 250–450)
UIBC: 164 ug/dL

## 2020-05-13 LAB — HEMOGLOBIN AND HEMATOCRIT, BLOOD
HCT: 22.9 % — ABNORMAL LOW (ref 36.0–46.0)
Hemoglobin: 7.3 g/dL — ABNORMAL LOW (ref 12.0–15.0)

## 2020-05-13 LAB — FOLATE: Folate: 10.2 ng/mL (ref >5.9)

## 2020-05-13 MED ORDER — FERROUS SULFATE 325 (65 FE) MG PO TABS: 325.0000 mg | ORAL_TABLET | Freq: Two times a day (BID) | ORAL | Status: DC

## 2020-05-13 NOTE — Progress Notes (Signed)
Physical Therapy Treatment Patient Details Name: Stacy Allen MRN: [id number] DOB: [birth year] Today's Date: 05/13/2020    History of Present Illness Pt is a 79 y.o. female with medical history significant for coronary artery disease, hypertension, fibromyalgia, dyslipidemia who presents to the ER at the request of her orthopedic surgeon for a right femoral neck fracture, underwent R hip hemiarthroplasty.    PT Comments    Pt was still sitting up in recliner from previous/AM session. She is agreeable to PT session and willing to trial ambulation. Performed STS form recliner with min assist + a lot of vcs for technique, precautions, and safety. Was able to ambulate with RW 15 ft prior to returning to supine in bed. Pt has poor gait quality and fatigued extremely quickly. Recommend chair follow in future gait training. She will continue to benefit from skilled PT at STR to address deficits and assist pt to PLOF. At conclusion of session, pt was in bed, with bed alarm set, call bell in reach, and SCDs reapplied.     Follow Up Recommendations  SNF     Equipment Recommendations  Other (comment) (defer to next level of care)    Recommendations for Other Services       Precautions / Restrictions Precautions Precautions: Posterior Hip Precaution Booklet Issued: Yes (comment) Restrictions Weight Bearing Restrictions: Yes RLE Weight Bearing: Weight bearing as tolerated    Mobility  Bed Mobility Overal bed mobility: Needs Assistance Bed Mobility: Sit to Supine       Sit to supine: Max assist   General bed mobility comments: Max assist to return to supine from EOB sitting.  Transfers Overall transfer level: Needs assistance Equipment used: Rolling walker (2 wheeled) Transfers: Sit to/from Stand Sit to Stand: Min assist;From elevated surface         General transfer comment: Pt was able to STS from recliner with max vcs but only min assist.    Ambulation/Gait Ambulation/Gait assistance: Min assist Gait Distance (Feet): 15 Feet Assistive device: Rolling walker (2 wheeled) Gait Pattern/deviations: Step-to pattern;Antalgic;Trunk flexed Gait velocity: decreased   General Gait Details: Pt was able to ambulate 15 ft however has very poor gait quality. Very antalgic step to pattern with leaning on elbows of during ambulation for frequent rest. recommend chair follow in future gait training       Balance Overall balance assessment: Needs assistance Sitting-balance support: Feet supported;Bilateral upper extremity supported Sitting balance-Leahy Scale: Good Sitting balance - Comments: no LOB sitting with BLEs support only   Standing balance support: Bilateral upper extremity supported;During functional activity Standing balance-Leahy Scale: Fair Standing balance comment: Heavy reliance on RW for support       Cognition Arousal/Alertness: Awake/alert Behavior During Therapy: WFL for tasks assessed/performed Overall Cognitive Status: Within Functional Limits for tasks assessed      General Comments: Pt is A and O x 4. agreeable and cooperative throughout.             Pertinent Vitals/Pain Pain Assessment: 0-10 Pain Score: 3  Faces Pain Scale: Hurts a little bit Pain Location: R hip Pain Descriptors / Indicators: Aching;Grimacing;Moaning;Guarding Pain Intervention(s): Limited activity within patient's tolerance;Monitored during session;Premedicated before session;Repositioned           PT Goals (current goals can now be found in the care plan section) Acute Rehab PT Goals Patient Stated Goal: Do what I need to do to get home after rehab Progress towards PT goals: Progressing toward goals    Frequency  BID      PT Plan Current plan remains appropriate       AM-PAC PT "6 Clicks" Mobility   Outcome Measure  Help needed turning from your back to your side while in a flat bed without using bedrails?:  A Little Help needed moving from lying on your back to sitting on the side of a flat bed without using bedrails?: A Lot Help needed moving to and from a bed to a chair (including a wheelchair)?: A Lot Help needed standing up from a chair using your arms (e.g., wheelchair or bedside chair)?: A Lot Help needed to walk in hospital room?: A Lot Help needed climbing 3-5 steps with a railing? : A Lot 6 Click Score: 13    End of Session Equipment Utilized During Treatment: Gait belt Activity Tolerance: Patient tolerated treatment well Patient left: in bed;with call bell/phone within reach;with bed alarm set;with SCD's reapplied Nurse Communication: Mobility status PT Visit Diagnosis: Other abnormalities of gait and mobility (R26.89);Muscle weakness (generalized) (M62.81);Difficulty in walking, not elsewhere classified (R26.2)     Time: 3291-9166 PT Time Calculation (min) (ACUTE ONLY): 25 min  Charges:  $Gait Training: 8-22 mins $Therapeutic Activity: 8-22 mins                     Julaine Fusi PTA 05/13/20, 1:42 PM

## 2020-05-13 NOTE — TOC Progression Note (Signed)
Transition of Care [facility]) - Progression Note    Patient Details  Name: Stacy Allen MRN: [id number] Date of Birth: October 01, 1940  Transition of Care [facility]) CM/SW [place], RN Phone Number: 05/13/2020, 2:20 PM  Clinical Narrative:   RNCM uploaded additional clinicals through Nav[employer] portal for review.     Expected Discharge Plan: [place] Barriers to Discharge: Continued Medical Work up  Expected Discharge Plan and Services Expected Discharge Plan: [place]   Discharge Planning Services: CM Consult Post Acute Care Choice: [place] Living arrangements for the past 2 months: Single Family Home                                       Social Determinants of Health (SDOH) Interventions    Readmission Risk Interventions No flowsheet data found.

## 2020-05-13 NOTE — Progress Notes (Signed)
PROGRESS NOTE    Stacy Allen  WYO:[id number] DOB: [birth year] DOA: 05/06/2020 PCP: [place]   Brief Narrative: Taken from H&P.  Stacy Allen is a 79 y.o. female with medical history significant for coronary artery disease, hypertension, dyslipidemia who presents to the ER at the request of her orthopedic surgeon for a right femoral neck fracture secondary to mechanical fall.  S/p hemiarthroplasty done on 05/05/2020. 05/07/2020.  Developed serosanguineous discharge from operative site.  Wound VAC was placed on 05/11/2020 by orthopedic due to persistent discharge. She was also started on IV cefazolin. Continue to have some discharge and wound VAC although improving. Orthopedic is recommending p.o. antibiotics for 7 more days and outpatient follow-up. Waiting for SNF.   Subjective: Patient was complaining of right shoulder pain.  She was asking for discharge to SNF so she can be done with rehab by Christmas time.  Pain at surgical site is well controlled.  Assessment & Plan:   Principal Problem:   Closed right hip fracture (HCC) Active Problems:   Chronic pain syndrome   Essential hypertension   Hypothyroidism  Closed right hip fracture secondary to mechanical fall.  S/p hemiarthroplasty with complications of serosanguineous discharge for the past many days.  Wound VAC is in place. -Per orthopedic she can be discharged to SNF with wound VAC and will follow up with them in 1 week. -Continue cefazolin for now-can be discharged on Keflex for 1 more week. -Continue with pain management.  Right shoulder pain.  Most likely secondary to fall.  Imaging negative for any acute fractures.  Postoperative blood loss anemia.  Hemoglobin dropped to 6.9 and she received 1 unit of PRBC on 05/11/2020. Hemoglobin seems stable around 7.3-7.4 now. Anemia panel with low iron but TI BC is also low which is more consistent with anemia of chronic disease with some iron  deficiency. -Patient is already on iron supplement-continue with that.  Hypertension.  Blood pressure within goal. -Continue with beta-blocker. -Home dose of lisinopril was discontinued due to softer blood pressure-can be restarted if needed  Leukocytosis.  Resolved  Hyponatremia.  Resolved.  Patient received IV fluid.  She is also on citalopram which can be contributory.  Hypothyroidism. -Continue home dose of Synthroid.  History of depression and anxiety.  No acute concern. -Continue home dose of citalopram and Klonopin.  GERD. -Continue with Protonix  Objective: Vitals:   05/13/20 0338 05/13/20 0803 05/13/20 1130 05/13/20 1538  BP: 134/71 (!) 148/72 127/70 117/68  Pulse: 86 87 74 89  Resp: 16 17 17 17   Temp: 98.3 F (36.8 C) 98.9 F (37.2 C) 98.1 F (36.7 C) 98.7 F (37.1 C)  TempSrc: Oral Oral Oral Oral  SpO2: 95% 95% 97% 96%  Weight:      Height:        Intake/Output Summary (Last 24 hours) at 05/13/2020 1705 Last data filed at 05/13/2020 0602 Gross per 24 hour  Intake --  Output 1600 ml  Net -1600 ml   Filed Weights   05/06/20 1129 05/06/20 2257  Weight: 102.1 kg 104.3 kg    Examination:  General exam: Appears calm and comfortable  Respiratory system: Clear to auscultation. Respiratory effort normal. Cardiovascular system: S1 & S2 heard, RRR. No JVD, murmurs, rubs, gallops or clicks. Gastrointestinal system: Soft, nontender, nondistended, bowel sounds positive. Central nervous system: Alert and oriented. No focal neurological deficits.Symmetric 5 x 5 power. Extremities: No edema, no cyanosis, pulses intact and symmetrical. Psychiatry: Judgement and insight appear normal.  Mood & affect appropriate.    DVT prophylaxis: Lovenox Code Status: Full Family Communication: Discussed with patient  Disposition Plan:  Status is: Inpatient  Remains inpatient appropriate because:Inpatient level of care appropriate due to severity of illness   Dispo: The  patient is from: Home              Anticipated d/c is to: SNF              Anticipated d/c date is: 1 day              Patient currently is medically stable to d/c.   Consultants:   Orthopedic  Procedures:  Antimicrobials:  Ancef  Data Reviewed: I have personally reviewed following labs and imaging studies  CBC: Recent Labs  Lab 05/09/20 0733 05/09/20 0733 05/09/20 2353 05/10/20 0614 05/11/20 0502 05/12/20 0735 05/13/20 0554  WBC 15.4*  --  14.0* 11.5* 9.5 7.5  --   NEUTROABS  --   --   --   --   --  4.9  --   HGB 8.4*   < > 7.1* 7.1* 6.9* 7.4* 7.3*  HCT 25.5*   < > 21.6* 21.5* 21.3* 22.4* 22.9*  MCV 90.1  --  89.6 89.2 92.2 90.7  --   PLT 236  --  218 222 260 281  --    < > = values in this interval not displayed.   Basic Metabolic Panel: Recent Labs  Lab 05/07/20 0509 05/07/20 0509 05/08/20 0623 05/09/20 0555 05/10/20 0614 05/11/20 0502 05/12/20 0735  NA 136   < > 133* 130* 128* 129* 133*  K 4.0  --  4.3 3.9  --  4.1 4.6  CL 102  --  100 99  --  99 97*  CO2 26  --  27 25  --  24 28  GLUCOSE 96  --  123* 108*  --  119* 105*  BUN 14  --  22 45*  --  38* 21  CREATININE 0.70  --  0.74 0.89  --  0.85 0.71  CALCIUM 8.7*  --  8.5* 8.3*  --  8.2* 8.0*   < > = values in this interval not displayed.   GFR: Estimated Creatinine Clearance: 68.1 mL/min (by C-G formula based on SCr of 0.71 mg/dL). Liver Function Tests: No results for input(s): AST, ALT, ALKPHOS, BILITOT, PROT, ALBUMIN in the last 168 hours. No results for input(s): LIPASE, AMYLASE in the last 168 hours. No results for input(s): AMMONIA in the last 168 hours. Coagulation Profile: No results for input(s): INR, PROTIME in the last 168 hours. Cardiac Enzymes: No results for input(s): CKTOTAL, CKMB, CKMBINDEX, TROPONINI in the last 168 hours. BNP (last 3 results) No results for input(s): PROBNP in the last 8760 hours. HbA1C: No results for input(s): HGBA1C in the last 72 hours. CBG: No results for  input(s): GLUCAP in the last 168 hours. Lipid Profile: No results for input(s): CHOL, HDL, LDLCALC, TRIG, CHOLHDL, LDLDIRECT in the last 72 hours. Thyroid Function Tests: No results for input(s): TSH, T4TOTAL, FREET4, T3FREE, THYROIDAB in the last 72 hours. Anemia Panel: Recent Labs    05/13/20 0554  VITAMINB12 259  FOLATE 10.2  FERRITIN 149  TIBC 188*  IRON 24*  RETICCTPCT 3.2*   Sepsis Labs: No results for input(s): PROCALCITON, LATICACIDVEN in the last 168 hours.  Recent Results (from the past 240 hour(s))  Respiratory Panel by RT PCR (Flu A&B, Covid) - Nasopharyngeal Swab  Status: None   Collection Time: 05/06/20 12:43 PM   Specimen: Nasopharyngeal Swab  Result Value Ref Range Status   SARS Coronavirus 2 by RT PCR NEGATIVE NEGATIVE Final    Comment: (NOTE) SARS-CoV-2 target nucleic acids are NOT DETECTED.  The SARS-CoV-2 RNA is generally detectable in upper respiratoy specimens during the acute phase of infection. The lowest concentration of SARS-CoV-2 viral copies this assay can detect is 131 copies/mL. A negative result does not preclude SARS-Cov-2 infection and should not be used as the sole basis for treatment or other patient management decisions. A negative result may occur with  improper specimen collection/handling, submission of specimen other than nasopharyngeal swab, presence of viral mutation(s) within the areas targeted by this assay, and inadequate number of viral copies (<131 copies/mL). A negative result must be combined with clinical observations, patient history, and epidemiological information. The expected result is Negative.  Fact Sheet for Patients:  PinkCheek.be  Fact Sheet for Healthcare Providers:  GravelBags.it  This test is no t yet approved or cleared by the [place] FDA and  has been authorized for detection and/or diagnosis of SARS-CoV-2 by FDA under an Emergency Use  Authorization (EUA). This EUA will remain  in effect (meaning this test can be used) for the duration of the COVID-19 declaration under Section 564(b)(1) of the Act, 21 U.S.C. section 360bbb-3(b)(1), unless the authorization is terminated or revoked sooner.     Influenza A by PCR NEGATIVE NEGATIVE Final   Influenza B by PCR NEGATIVE NEGATIVE Final    Comment: (NOTE) The Xpert Xpress SARS-CoV-2/FLU/RSV assay is intended as an aid in  the diagnosis of influenza from Nasopharyngeal swab specimens and  should not be used as a sole basis for treatment. Nasal washings and  aspirates are unacceptable for Xpert Xpress SARS-CoV-2/FLU/RSV  testing.  Fact Sheet for Patients: PinkCheek.be  Fact Sheet for Healthcare Providers: GravelBags.it  This test is not yet approved or cleared by the [place] FDA and  has been authorized for detection and/or diagnosis of SARS-CoV-2 by  FDA under an Emergency Use Authorization (EUA). This EUA will remain  in effect (meaning this test can be used) for the duration of the  Covid-19 declaration under Section 564(b)(1) of the Act, 21  U.S.C. section 360bbb-3(b)(1), unless the authorization is  terminated or revoked. Performed at [facility], [address], [place], [place] [address]      Radiology Studies: No results found.  Scheduled Meds: . calcium carbonate  500 mg of elemental calcium Oral Q breakfast  . Chlorhexidine Gluconate Cloth  6 each Topical Daily  . citalopram  40 mg Oral Daily  . docusate sodium  100 mg Oral BID  . enoxaparin (LOVENOX) injection  0.5 mg/kg Subcutaneous Q24H  . ferrous sulfate  325 mg Oral BID  . fluticasone  2 spray Each Nare Daily  . gabapentin  600 mg Oral TID  . levothyroxine  137 mcg Oral QAC breakfast  . lidocaine  1 patch Transdermal Q24H  . loratadine  10 mg Oral Daily  . metoprolol tartrate  12.5 mg Oral BID  . mirtazapine  15 mg Oral  QHS  . pantoprazole  40 mg Oral Daily  . polyethylene glycol  17 g Oral Daily  . polyvinyl alcohol   Both Eyes Daily  . Ensure Max Protein  11 oz Oral Daily  . senna  1 tablet Oral BID  . [START ON 05/26/2020] Vitamin D (Ergocalciferol)  50,000 Units Oral Q30 days  Continuous Infusions: .  ceFAZolin (ANCEF) IV 2 g (05/13/20 1356)     LOS: 7 days   Time spent: 35 minutes.Lorella Nimrod, MD [facility]  If 7PM-7AM, please contact night-coverage [email or web address]  05/13/2020, 5:05 PM   This record has been created using Systems analyst. Errors have been sought and corrected,but may not always be located. Such creation errors do not reflect on the standard of care.

## 2020-05-13 NOTE — Progress Notes (Signed)
Physical Therapy Treatment Patient Details Name: Stacy Allen MRN: [id number] DOB: 1940-06-29 Today's Date: 05/13/2020    History of Present Illness Pt is a 79 y.o. female with medical history significant for coronary artery disease, hypertension, fibromyalgia, dyslipidemia who presents to the ER at the request of her orthopedic surgeon for a right femoral neck fracture, underwent R hip hemiarthroplasty.    PT Comments    Pt was long sitting in bed with RN in room. She agrees to PT session and is very motivated to get OOB. Endorsed 6/10 R hip pain with movement. Was able to adhere to precautions throughout. Pt required mod assist to exit L side of bed. Stood 2 x from slightly elevated bed height prior to ambulating 8 ft with RW. Pt have slow antalgic step to gait pattern. Overall tolerated session well but is extremely limited by fatigue/SOB. Highly recommend DC to SNF to address deficits with strength and safe functional mobility.      Follow Up Recommendations  SNF     Equipment Recommendations  Other (comment) (defer to next level of care)    Recommendations for Other Services       Precautions / Restrictions Precautions Precautions: Posterior Hip Precaution Booklet Issued: Yes (comment) Restrictions Weight Bearing Restrictions: Yes RLE Weight Bearing: Weight bearing as tolerated    Mobility  Bed Mobility Overal bed mobility: Needs Assistance Bed Mobility: Supine to Sit Rolling: Max assist   Supine to sit: Mod assist        Transfers Overall transfer level: Needs assistance Equipment used: Rolling walker (2 wheeled) Transfers: Sit to/from Stand Sit to Stand: Min assist;From elevated surface         General transfer comment: Pt required min assist to stand from slightly elevated ebd height. Vcs for improved technique and precaution reminders  Ambulation/Gait Ambulation/Gait assistance: Min assist Gait Distance (Feet): 8 Feet Assistive device: Rolling  walker (2 wheeled) Gait Pattern/deviations: Step-to pattern Gait velocity: decreased   General Gait Details: pt was able to ambulate ~ 8 ft with RW + slow antalgic step to gait pattern. Overall tolerated well but limited by fatigue/SOB       Balance Overall balance assessment: Needs assistance Sitting-balance support: Feet supported;Bilateral upper extremity supported Sitting balance-Leahy Scale: Good Sitting balance - Comments: no LOB sitting with BLEs support only   Standing balance support: Bilateral upper extremity supported;During functional activity Standing balance-Leahy Scale: Fair Standing balance comment: Heavy reliance on RW for support        Cognition Arousal/Alertness: Awake/alert Behavior During Therapy: WFL for tasks assessed/performed Overall Cognitive Status: Within Functional Limits for tasks assessed        General Comments: Pt is A and O x 4. agreeable and cooperative throughout./ RN issued meds right before session             Pertinent Vitals/Pain Pain Assessment: 0-10 Pain Score: 6  Faces Pain Scale: Hurts a little bit Pain Location: R hip Pain Descriptors / Indicators: Aching;Grimacing;Moaning;Guarding Pain Intervention(s): Limited activity within patient's tolerance;Monitored during session;Premedicated before session;Repositioned           PT Goals (current goals can now be found in the care plan section) Acute Rehab PT Goals Patient Stated Goal: Do what I need to do to get home after rehab Progress towards PT goals: Progressing toward goals    Frequency    BID      PT Plan Current plan remains appropriate       AM-PAC PT "6 Clicks"  Mobility   Outcome Measure  Help needed turning from your back to your side while in a flat bed without using bedrails?: A Little Help needed moving from lying on your back to sitting on the side of a flat bed without using bedrails?: A Lot Help needed moving to and from a bed to a chair  (including a wheelchair)?: A Lot Help needed standing up from a chair using your arms (e.g., wheelchair or bedside chair)?: A Lot Help needed to walk in hospital room?: A Lot Help needed climbing 3-5 steps with a railing? : A Lot 6 Click Score: 13    End of Session Equipment Utilized During Treatment: Gait belt Activity Tolerance: Patient tolerated treatment well Patient left: in chair;with call bell/phone within reach;with chair alarm set;with nursing/sitter in room Nurse Communication: Mobility status PT Visit Diagnosis: Other abnormalities of gait and mobility (R26.89);Muscle weakness (generalized) (M62.81);Difficulty in walking, not elsewhere classified (R26.2)     Time: 0352-4818 PT Time Calculation (min) (ACUTE ONLY): 27 min  Charges:  $Therapeutic Activity: 23-37 mins                     Julaine Fusi PTA 05/13/20, 9:56 AM

## 2020-05-13 NOTE — Progress Notes (Signed)
  Subjective:  POD #6 s/p right hip hemiarthroplasty.   Patient reports right hip pain as mild to moderate.    Objective:   VITALS:   Vitals:   05/12/20 2359 05/13/20 0338 05/13/20 0803 05/13/20 1130  BP: 130/74 134/71 (!) 148/72 127/70  Pulse: 80 86 87 74  Resp: 16 16 17 17   Temp: 98.1 F (36.7 C) 98.3 F (36.8 C) 98.9 F (37.2 C) 98.1 F (36.7 C)  TempSrc: Oral Oral Oral Oral  SpO2: 98% 95% 95% 97%  Weight:      Height:        PHYSICAL EXAM: Right lower extremity: Prevena dressing is still functioning. Neurovascular intact Sensation intact distally Intact pulses distally Dorsiflexion/Plantar flexion intact Incision: dressing C/D/I No cellulitis present Compartment soft  LABS  Results for orders placed or performed during the hospital encounter of 05/06/20 (from the past 24 hour(s))  Hemoglobin and hematocrit, blood     Status: Abnormal   Collection Time: 05/13/20  5:54 AM  Result Value Ref Range   Hemoglobin 7.3 (L) 12.0 - 15.0 g/dL   HCT 22.9 (L) 36 - 46 %  Folate     Status: None   Collection Time: 05/13/20  5:54 AM  Result Value Ref Range   Folate 10.2 >5.9 ng/mL  Iron and TIBC     Status: Abnormal   Collection Time: 05/13/20  5:54 AM  Result Value Ref Range   Iron 24 (L) 28 - 170 ug/dL   TIBC 188 (L) 250 - 450 ug/dL   Saturation Ratios 13 10.4 - 31.8 %   UIBC 164 ug/dL  Ferritin     Status: None   Collection Time: 05/13/20  5:54 AM  Result Value Ref Range   Ferritin 149 11 - 307 ng/mL  Reticulocytes     Status: Abnormal   Collection Time: 05/13/20  5:54 AM  Result Value Ref Range   Retic Ct Pct 3.2 (H) 0.4 - 3.1 %   RBC. 2.52 (L) 3.87 - 5.11 MIL/uL   Retic Count, Absolute 81.6 19.0 - 186.0 K/uL   Immature Retic Fract 35.6 (H) 2.3 - 15.9 %    No results found.  Assessment/Plan: 6 Days Post-Op   Principal Problem:   Closed right hip fracture (HCC) Active Problems:   Chronic pain syndrome   Essential hypertension    Hypothyroidism  Patient Prevena dressing is not putting out much serosanguineous drainage.  Continue Lovenox for DVT prophylaxis.  Patient will continue on oral antibiotics after discharge for 1 week to prevent wound infection given her persistent serosanguineous drainage.  Patient will follow up in the office in 1 week for wound check.  Patient okay to discharge to SNF from an orthopedic standpoint.  Coordinated with the hospitalist on these discharge instructions.  Patient remain on posterior hip precautions but may weight-bear as tolerated on right lower extremity.  Continue physical therapy.   Thornton Park , MD 05/13/2020, 2:04 PM

## 2020-05-13 NOTE — Plan of Care (Signed)
  Problem: Activity: Goal: Interest or engagement in activities will improve Outcome: Progressing Goal: Sleeping patterns will improve Outcome: Progressing   Problem: Coping: Goal: Ability to verbalize frustrations and anger appropriately will improve Outcome: Progressing Goal: Ability to demonstrate self-control will improve Outcome: Progressing   Problem: Health Behavior/Discharge Planning: Goal: Identification of resources available to assist in meeting health care needs will improve Outcome: Progressing Goal: Compliance with treatment plan for underlying cause of condition will improve Outcome: Progressing   Problem: Safety: Goal: Periods of time without injury will increase Outcome: Progressing

## 2020-05-14 DIAGNOSIS — I10 Essential (primary) hypertension: Secondary | ICD-10-CM | POA: Diagnosis not present

## 2020-05-14 DIAGNOSIS — S72001A Fracture of unspecified part of neck of right femur, initial encounter for closed fracture: Secondary | ICD-10-CM | POA: Diagnosis not present

## 2020-05-14 DIAGNOSIS — S72001K Fracture of unspecified part of neck of right femur, subsequent encounter for closed fracture with nonunion: Secondary | ICD-10-CM | POA: Diagnosis not present

## 2020-05-14 DIAGNOSIS — E039 Hypothyroidism, unspecified: Secondary | ICD-10-CM | POA: Diagnosis not present

## 2020-05-14 LAB — CBC
HCT: 23.1 % — ABNORMAL LOW (ref 36.0–46.0)
Hemoglobin: 7.5 g/dL — ABNORMAL LOW (ref 12.0–15.0)
MCH: 30 pg (ref 26.0–34.0)
MCHC: 32.5 g/dL (ref 30.0–36.0)
MCV: 92.4 fL (ref 80.0–100.0)
Platelets: 360 10*3/uL (ref 150–400)
RBC: 2.5 MIL/uL — ABNORMAL LOW (ref 3.87–5.11)
RDW: 15.2 % (ref 11.5–15.5)
WBC: 7.3 10*3/uL (ref 4.0–10.5)
nRBC: 0 % (ref 0.0–0.2)

## 2020-05-14 LAB — RESP PANEL BY RT-PCR (FLU A&B, COVID) ARPGX2
Influenza A by PCR: NEGATIVE
Influenza B by PCR: NEGATIVE
SARS Coronavirus 2 by RT PCR: NEGATIVE

## 2020-05-14 LAB — BASIC METABOLIC PANEL
Anion gap: 7 (ref 5–15)
BUN: 17 mg/dL (ref 8–23)
CO2: 27 mmol/L (ref 22–32)
Calcium: 7.8 mg/dL — ABNORMAL LOW (ref 8.9–10.3)
Chloride: 98 mmol/L (ref 98–111)
Creatinine, Ser: 0.6 mg/dL (ref 0.44–1.00)
GFR, Estimated: >60 mL/min (ref >60)
Glucose, Bld: 109 mg/dL — ABNORMAL HIGH (ref 70–99)
Potassium: 4.4 mmol/L (ref 3.5–5.1)
Sodium: 132 mmol/L — ABNORMAL LOW (ref 135–145)

## 2020-05-14 MED ORDER — ALUM & MAG HYDROXIDE-SIMETH 200-200-20 MG/5ML PO SUSP: 30.0000 mL | ORAL | 0 refills | Status: DC | PRN

## 2020-05-14 MED ORDER — OXYCODONE HCL 5 MG PO TABS
5.0000 mg | ORAL_TABLET | ORAL | 0 refills | Status: DC | PRN
Start: 2020-05-14 — End: 2021-10-23

## 2020-05-14 MED ORDER — POLYETHYLENE GLYCOL 3350 17 G PO PACK
17.0000 g | PACK | Freq: Every day | ORAL | 0 refills | Status: DC
Start: 2020-05-14 — End: 2021-10-23

## 2020-05-14 MED ORDER — LIDOCAINE 5 % EX PTCH: 1.0000 | MEDICATED_PATCH | CUTANEOUS | 0 refills | Status: DC

## 2020-05-14 MED ORDER — TIZANIDINE HCL 4 MG PO TABS: 4.0000 mg | ORAL_TABLET | Freq: Four times a day (QID) | ORAL | Status: DC | PRN

## 2020-05-14 MED ORDER — FLUTICASONE FUROATE 27.5 MCG/SPRAY NA SUSP
2.0000 | Freq: Every day | NASAL | Status: DC
Start: 2020-05-14 — End: 2021-10-23

## 2020-05-14 MED ORDER — DOCUSATE SODIUM 100 MG PO CAPS
100.0000 mg | ORAL_CAPSULE | Freq: Two times a day (BID) | ORAL | 0 refills | Status: DC
Start: 2020-05-14 — End: 2021-10-23

## 2020-05-14 MED ORDER — ENSURE MAX PROTEIN PO LIQD: 11.0000 [oz_av] | Freq: Every day | ORAL | Status: DC

## 2020-05-14 MED ORDER — CETIRIZINE HCL 10 MG PO TABS: 10.0000 mg | ORAL_TABLET | Freq: Every day | ORAL | 0 refills | Status: DC

## 2020-05-14 MED ORDER — CEPHALEXIN 500 MG PO CAPS: 500.0000 mg | ORAL_CAPSULE | Freq: Two times a day (BID) | ORAL | 0 refills | Status: AC

## 2020-05-14 MED ORDER — SIMETHICONE 80 MG PO CHEW: 80.0000 mg | CHEWABLE_TABLET | Freq: Four times a day (QID) | ORAL | 0 refills | Status: DC | PRN

## 2020-05-14 MED ORDER — POLYVINYL ALCOHOL 1.4 % OP SOLN
1.0000 [drp] | Freq: Every day | OPHTHALMIC | 0 refills | Status: DC
Start: 2020-05-14 — End: 2021-10-23

## 2020-05-14 MED ORDER — HYDROXYZINE HCL 10 MG PO TABS
10.0000 mg | ORAL_TABLET | Freq: Three times a day (TID) | ORAL | Status: DC | PRN
Start: 2020-05-14 — End: 2021-10-23

## 2020-05-14 MED ORDER — CALCIUM CARBONATE 1250 (500 CA) MG PO TABS
1.0000 | ORAL_TABLET | Freq: Every day | ORAL | Status: DC
Start: 2020-05-14 — End: 2021-10-23

## 2020-05-14 NOTE — Progress Notes (Signed)
  Subjective:  POD #7 s/p right hip hemiarthroplasty.   Patient reports right hip pain as mild.  Patient due to be discharged to a skilled nursing facility today.  Objective:   VITALS:   Vitals:   05/13/20 2329 05/14/20 0324 05/14/20 0734 05/14/20 1138  BP: 113/67 125/63 126/64 (!) 145/76  Pulse: 78 84 83 83  Resp: 17 16 15 16   Temp: 97.6 F (36.4 C) 98.2 F (36.8 C) 98.2 F (36.8 C) 98.1 F (36.7 C)  TempSrc: Oral  Oral Oral  SpO2: 96% 98% 97% 97%  Weight:      Height:        PHYSICAL EXAM: Right lower extremity: Prevena dressing continues to function well. Neurovascular intact Sensation intact distally Intact pulses distally Dorsiflexion/Plantar flexion intact Incision: dressing C/D/I No cellulitis present Compartment soft  LABS  Results for orders placed or performed during the hospital encounter of 05/06/20 (from the past 24 hour(s))  CBC     Status: Abnormal   Collection Time: 05/14/20  4:23 AM  Result Value Ref Range   WBC 7.3 4.0 - 10.5 K/uL   RBC 2.50 (L) 3.87 - 5.11 MIL/uL   Hemoglobin 7.5 (L) 12.0 - 15.0 g/dL   HCT 23.1 (L) 36 - 46 %   MCV 92.4 80.0 - 100.0 fL   MCH 30.0 26.0 - 34.0 pg   MCHC 32.5 30.0 - 36.0 g/dL   RDW 15.2 11.5 - 15.5 %   Platelets 360 150 - 400 K/uL   nRBC 0.0 0.0 - 0.2 %  Basic metabolic panel     Status: Abnormal   Collection Time: 05/14/20  4:23 AM  Result Value Ref Range   Sodium 132 (L) 135 - 145 mmol/L   Potassium 4.4 3.5 - 5.1 mmol/L   Chloride 98 98 - 111 mmol/L   CO2 27 22 - 32 mmol/L   Glucose, Bld 109 (H) 70 - 99 mg/dL   BUN 17 8 - 23 mg/dL   Creatinine, Ser 0.60 0.44 - 1.00 mg/dL   Calcium 7.8 (L) 8.9 - 10.3 mg/dL   GFR, Estimated >60 >60 mL/min   Anion gap 7 5 - 15  Resp Panel by RT-PCR (Flu A&B, Covid) Nasopharyngeal Swab     Status: None   Collection Time: 05/14/20 11:20 AM   Specimen: Nasopharyngeal Swab; Nasopharyngeal(NP) swabs in vial transport medium  Result Value Ref Range   SARS Coronavirus 2 by RT  PCR NEGATIVE NEGATIVE   Influenza A by PCR NEGATIVE NEGATIVE   Influenza B by PCR NEGATIVE NEGATIVE    No results found.  Assessment/Plan: 7 Days Post-Op   Principal Problem:   Closed right hip fracture (HCC) Active Problems:   Chronic pain syndrome   Essential hypertension   Hypothyroidism   Shoulder pain, right  Patient okay for discharge from an orthopedic standpoint.  Patient will follow-up in the office in approximately 1 week for reevaluation and staple removal.  Prevena dressing will stay in place until her return.  Patient should remain either on Lovenox or enteric-coated aspirin 3 2 5  mg p.o. twice daily DVT prophylaxis.[name]   Thornton Park , MD 05/14/2020, 1:09 PM

## 2020-05-14 NOTE — Progress Notes (Signed)
Physical Therapy Treatment Patient Details Name: Stacy Allen MRN: [id number] DOB: [birth year] Today's Date: 05/14/2020    History of Present Illness Pt is a 79 y.o. female with medical history significant for coronary artery disease, hypertension, fibromyalgia, dyslipidemia who presents to the ER at the request of her orthopedic surgeon for a right femoral neck fracture, underwent R hip hemiarthroplasty.    PT Comments    Pt was long sitting in be upon arriving. She agrees to PT session and reports feeling "great". Agrees to OOB activity. Required max assist  To exit L side of bed. Stood from slightly elevate bed height with min assist. Was able to adhere to precautions with cueing. She was easily able to take 8 steps to get to recliner with slow antalgic step to gait pattern. Demonstrated good eccentric control with stand to sit. RN entered room to issue medication. Pt was in recliner at conclusion of session with call bell in reach and chair alarm in place. Will benefit from continued skilled PT at DC to address deficits while assisting pt to PLOF. Recommend SNF at DC.    Follow Up Recommendations  SNF     Equipment Recommendations  Other (comment) (defer to next level of care)    Recommendations for Other Services       Precautions / Restrictions Precautions Precautions: Posterior Hip Precaution Booklet Issued: Yes (comment) Restrictions Weight Bearing Restrictions: Yes RLE Weight Bearing: Weight bearing as tolerated    Mobility  Bed Mobility Overal bed mobility: Needs Assistance Bed Mobility: Supine to Sit Rolling: Mod assist   Supine to sit: Mod assist;Max assist        Transfers Overall transfer level: Needs assistance Equipment used: Rolling walker (2 wheeled) Transfers: Sit to/from Stand Sit to Stand: Min assist         General transfer comment: Pt was able to stand from slightly elevated bed height with Min assist. was abloe to adhere to precautions and  did demonstrrate proper hand placement/technique  Ambulation/Gait Ambulation/Gait assistance: Min assist Gait Distance (Feet): 8 Feet Assistive device: Rolling walker (2 wheeled) Gait Pattern/deviations: Step-to pattern;Antalgic;Trunk flexed Gait velocity: decreased   General Gait Details: pt was able to ambulate ~ 8 ft with RW without LOB however has very slow antalgic step to gait pattern.       Balance Overall balance assessment: Needs assistance Sitting-balance support: Feet supported;Bilateral upper extremity supported Sitting balance-Leahy Scale: Good Sitting balance - Comments: no LOB sitting with BLEs support only   Standing balance support: Bilateral upper extremity supported;During functional activity Standing balance-Leahy Scale: Fair Standing balance comment: Heavy reliance on RW for support         Cognition Arousal/Alertness: Awake/alert Behavior During Therapy: WFL for tasks assessed/performed Overall Cognitive Status: Within Functional Limits for tasks assessed      General Comments: Pt is A and O x 4. agreeable and cooperative throughout.      Exercises Total Joint Exercises Ankle Circles/Pumps: AROM;Strengthening;Both;10 reps Quad Sets: AROM;Strengthening;Both;10 reps Gluteal Sets: AROM;Strengthening;Both;10 reps Heel Slides: AAROM;Strengthening;5 reps Hip ABduction/ADduction: AAROM;10 reps Straight Leg Raises: AAROM;5 reps        Pertinent Vitals/Pain Pain Assessment: 0-10 Pain Score: 2  Faces Pain Scale: Hurts a little bit Pain Location: R hip Pain Descriptors / Indicators: Aching;Grimacing;Moaning;Guarding Pain Intervention(s): Limited activity within patient's tolerance;Monitored during session;Repositioned           PT Goals (current goals can now be found in the care plan section) Acute Rehab PT Goals Patient Stated  Goal: go to rehab Progress towards PT goals: Progressing toward goals    Frequency    BID      PT Plan  Current plan remains appropriate       AM-PAC PT "6 Clicks" Mobility   Outcome Measure  Help needed turning from your back to your side while in a flat bed without using bedrails?: A Little Help needed moving from lying on your back to sitting on the side of a flat bed without using bedrails?: A Lot Help needed moving to and from a bed to a chair (including a wheelchair)?: A Lot Help needed standing up from a chair using your arms (e.g., wheelchair or bedside chair)?: A Lot Help needed to walk in hospital room?: A Lot Help needed climbing 3-5 steps with a railing? : A Lot 6 Click Score: 13    End of Session   Activity Tolerance: Patient tolerated treatment well Patient left: in chair;with call bell/phone within reach;with chair alarm set;with nursing/sitter in room;with SCD's reapplied Nurse Communication: Mobility status PT Visit Diagnosis: Other abnormalities of gait and mobility (R26.89);Muscle weakness (generalized) (M62.81);Difficulty in walking, not elsewhere classified (R26.2)     Time: 4742-5956 PT Time Calculation (min) (ACUTE ONLY): 25 min  Charges:  $Therapeutic Exercise: 8-22 mins $Therapeutic Activity: 8-22 mins                     Julaine Fusi PTA 05/14/20, 9:27 AM

## 2020-05-14 NOTE — TOC Transition Note (Signed)
Transition of Care [facility]) - CM/SW Discharge Note   Patient Details  Name: Stacy Allen MRN: [id number] Date of Birth: Dec 18, 1940  [facility] CM/SW Contact:  Shelbie Ammons, RN Phone Number: 05/14/2020, 11:23 AM   Clinical Narrative:  Patient will discharge to [facility] today. DC Summary has been sent and patient will discharge with [facility]. She will need rapid Covid. RNCM completed d/c paperwork with phone number and room number for report on front. RNCM will call for transport when patient is ready.       Barriers to Discharge: Continued Medical Work up   Patient Goals and CMS Choice Patient states their goals for this hospitalization and ongoing recovery are:: To get stronger      Discharge Placement                       Discharge Plan and Services   Discharge Planning Services: CM Consult Post Acute Care Choice: Skilled Nursing Facility                               Social Determinants of Health (SDOH) Interventions     Readmission Risk Interventions No flowsheet data found.

## 2020-05-14 NOTE — Progress Notes (Signed)
Report was called to [facility] LPN at [employer].

## 2020-05-14 NOTE — Care Management Important Message (Signed)
Important Message  Patient Details  Name: Stacy Allen MRN: [id number] Date of Birth: 1941/01/20   Medicare Important Message Given:  Yes     Dannette Barbara 05/14/2020, 1:35 PM

## 2020-05-14 NOTE — Discharge Summary (Signed)
Physician Discharge Summary  Stacy Allen WPY:[id number] DOB: 02-15-1941 DOA: 05/06/2020  PCP: [place] date: 05/06/2020 Discharge date: 05/14/2020  Admitted From: Home Disposition: SNF  Recommendations for Outpatient Follow-up:  1. Follow up with PCP in 1-2 weeks 2. Follow-up with orthopedic surgery in 1 week 3. Please obtain BMP/CBC in one week 4. Please follow up on the following pending results: None  Home Health: No Equipment/Devices: Rolling walker, wound VAC Discharge Condition: Stable CODE STATUS: Full Diet recommendation: Heart Healthy   Brief/Interim Summary: Stacy Allen a 79 y.o.femalewith medical history significant forcoronary artery disease, hypertension, dyslipidemia who presents to the ER at the request of her orthopedic surgeon for a right femoral neck fracture secondary to mechanical fall.  S/p hemiarthroplasty done on 05/05/2020. 05/07/2020.  Developed serosanguineous discharge from operative site.  Wound VAC was placed on 05/11/2020 by orthopedic due to persistent discharge. She was also started on IV cefazolin, discharged on Keflex. Continue to have some discharge and wound VAC although improving. Orthopedic is recommending p.o. antibiotics for 7 more days and outpatient follow-up.  Patient was also complaining of right shoulder pain secondary to fall.  Imaging was negative for any acute fractures, most likely musculoskeletal injury. She can use lidocaine patch and pain medicine to help.  Patient developed postoperative blood loss anemia when her hemoglobin dropped to 6.9.  She did received 1 unit of PRBC on 05/11/2020.  Anemia panel with low iron and low TIBC which is more consistent with anemia of chronic disease along with some iron deficiency.  She was started on iron supplement and she will continue with that.  Hemoglobin stable around 7.5 on discharge.  She will need monitoring of her hemoglobin as an outpatient.  Her  blood pressure remained within goal with beta-blocker.  Home dose of enalapril was discontinued due to softer blood pressure.  Can be restarted if needed.  Patient did develop hyponatremia during current hospitalization which resolved with IV fluid.  She is also on citalopram which can be contributory. She was also on chlorthalidone which she recently discontinued.  She will continue her home dose of Synthroid and medications for her anxiety and depression and follow-up with her primary care provider for further management.  Discharge Diagnoses:  Principal Problem:   Closed right hip fracture (HCC) Active Problems:   Chronic pain syndrome   Essential hypertension   Hypothyroidism   Shoulder pain, right   Discharge Instructions  Discharge Instructions    Change dressing (specify)   Complete by: As directed    Dressing change: As needed.   Diet - low sodium heart healthy   Complete by: As directed    Discharge instructions   Complete by: As directed    It was pleasure taking care of you. Please follow-up with orthopedic surgeon in 1 week for removal of wound VAC. Continue taking antibiotics for 1 more week. We discontinue your Enalapril which you are taking it for blood pressure as your blood pressure was little soft. Follow-up with your primary care provider and they can restart blood pressure medications if needed.   Increase activity slowly   Complete by: As directed      Allergies as of 05/14/2020      Reactions   Zithromax [azithromycin] Diarrhea   "fainting"   Adhesive [tape]    blisters   Codeine Nausea And Vomiting   Lactose Intolerance (gi) Other (See Comments)   "stomach pain"   Morphine And Related Hives  Red Dye Itching   "red food dye"   Shellfish-derived Products Itching, Swelling   Latex Rash   With extended contact   Lodine [etodolac] Rash   Penicillins Rash   Given on 05/07/20.  No reaction noted.     Pravastatin Rash      Medication List     STOP taking these medications   chlorthalidone 25 MG tablet Commonly known as: HYGROTON   diclofenac 1.3 % Ptch Commonly known as: FLECTOR   enalapril 20 MG tablet Commonly known as: VASOTEC   meloxicam 7.5 MG tablet Commonly known as: MOBIC   SYSTANE COMPLETE OP Replaced by: polyvinyl alcohol 1.4 % ophthalmic solution     TAKE these medications   acetaminophen 500 MG tablet Commonly known as: TYLENOL Take 500 mg by mouth every 6 (six) hours as needed.   albuterol 108 (90 Base) MCG/ACT inhaler Commonly known as: VENTOLIN HFA Inhale 2 puffs into the lungs.   alum & mag hydroxide-simeth 200-200-20 MG/5ML suspension Commonly known as: MAALOX/MYLANTA Take 30 mLs by mouth every 4 (four) hours as needed for indigestion.   amLODipine 10 MG tablet Commonly known as: NORVASC Take 10 mg by mouth daily.   aspirin 81 MG chewable tablet Chew by mouth daily.   calcium carbonate 1250 (500 Ca) MG tablet Commonly known as: OS-CAL - dosed in mg of elemental calcium Take 1 tablet (500 mg of elemental calcium total) by mouth daily with breakfast.   cephALEXin 500 MG capsule Commonly known as: KEFLEX Take 1 capsule (500 mg total) by mouth 2 (two) times daily for 7 days.   cetirizine 10 MG tablet Commonly known as: ZYRTEC Take 1 tablet (10 mg total) by mouth daily. If needed at night for itching not relieved by Claritin in the morning.   citalopram 40 MG tablet Commonly known as: CELEXA Take 40 mg by mouth daily.   clonazePAM 1 MG tablet Commonly known as: KLONOPIN Take 1 mg by mouth 2 (two) times daily. As needed for anxiety   diclofenac sodium 1 % Gel Commonly known as: VOLTAREN Apply 2 g topically 4 (four) times daily.   docusate sodium 100 MG capsule Commonly known as: COLACE Take 1 capsule (100 mg total) by mouth 2 (two) times daily.   Ensure Max Protein Liqd Take 330 mLs (11 oz total) by mouth daily.   EPINEPHrine 0.3 mg/0.3 mL Soaj injection Commonly known as:  EPI-PEN Inject 0.3 mg into the muscle once.   ergocalciferol 1.25 MG (50000 UT) capsule Commonly known as: VITAMIN D2 Take 50,000 Units by mouth every 30 (thirty) days.   fluticasone 27.5 MCG/SPRAY nasal spray Commonly known as: VERAMYST Place 2 sprays into the nose daily.   gabapentin 600 MG tablet Commonly known as: NEURONTIN Take 600 mg by mouth 3 (three) times daily.   hydrOXYzine 10 MG tablet Commonly known as: ATARAX/VISTARIL Take 1 tablet (10 mg total) by mouth 3 (three) times daily as needed.   Iron (Ferrous Sulfate) 325 (65 Fe) MG Tabs Take by mouth 2 (two) times daily.   levothyroxine 137 MCG tablet Commonly known as: SYNTHROID Take 137 mcg by mouth daily.   lidocaine 5 % Commonly known as: LIDODERM Place 1 patch onto the skin daily. Remove & Discard patch within 12 hours or as directed by MD   metoprolol tartrate 25 MG tablet Commonly known as: LOPRESSOR Take 37.5 mg by mouth 2 (two) times daily.   mirtazapine 15 MG tablet Commonly known as: REMERON Take 15 mg by  mouth at bedtime.   nitroGLYCERIN 0.4 MG SL tablet Commonly known as: NITROSTAT Place 0.4 mg under the tongue every 5 (five) minutes as needed for chest pain.   omeprazole 20 MG capsule Commonly known as: PRILOSEC Take 40 mg by mouth daily.   oxyCODONE 5 MG immediate release tablet Commonly known as: Oxy IR/ROXICODONE Take 1-2 tablets (5-10 mg total) by mouth every 4 (four) hours as needed for moderate pain (pain score 4-6).   polyethylene glycol 17 g packet Commonly known as: MIRALAX / GLYCOLAX Take 17 g by mouth daily.   polyvinyl alcohol 1.4 % ophthalmic solution Commonly known as: LIQUIFILM TEARS Place 1 drop into both eyes daily. Replaces: SYSTANE COMPLETE OP   simethicone 80 MG chewable tablet Commonly known as: MYLICON Chew 1 tablet (80 mg total) by mouth 4 (four) times daily as needed for flatulence.   Systane 0.4-0.3 % Gel ophthalmic gel Generic drug: Polyethyl Glycol-Propyl  Glycol Place 1 application into both eyes 2 (two) times daily as needed (dry eyes).   tiZANidine 4 MG tablet Commonly known as: ZANAFLEX Take 1 tablet (4 mg total) by mouth every 6 (six) hours as needed for muscle spasms.            Discharge Care Instructions  (From admission, onward)         Start     Ordered   05/14/20 0000  Change dressing (specify)       Comments: Dressing change: As needed.   05/14/20 1048          Contact information for follow-up providers    [place]. Schedule an appointment as soon as possible for a visit.   Contact information: [place] [address] [phone]        Thornton Park, MD. Schedule an appointment as soon as possible for a visit in 1 week(s).   Specialty: Orthopedic Surgery Contact information: [place] [address] [phone]            Contact information for after-discharge care    Destination    [facility] Preferred SNF .   Service: Skilled Nursing Contact information: [place] [phone]                 Allergies  Allergen Reactions  . Zithromax [Azithromycin] Diarrhea    "fainting"  . Adhesive [Tape]     blisters  . Codeine Nausea And Vomiting  . Lactose Intolerance (Gi) Other (See Comments)    "stomach pain"  . Morphine And Related Hives  . Red Dye Itching    "red food dye"  . Shellfish-Derived Products Itching and Swelling  . Latex Rash    With extended contact  . Lodine [Etodolac] Rash  . Penicillins Rash    Given on 05/07/20.  No reaction noted.    . Pravastatin Rash    Consultations:  Orthopedic  Procedures/Studies: DG Shoulder Right Port  Result Date: 05/07/2020 CLINICAL DATA:  79 year old female with right shoulder pain. EXAM: PORTABLE RIGHT SHOULDER COMPARISON:  Chest radiograph dated 02/17/2019. FINDINGS: There is no acute fracture or dislocation. No  significant arthritic changes. There is mild degenerative changes of the [facility] joint with spurring. The soft tissues are unremarkable. A wire over the right axilla, likely superimposed on the patient. IMPRESSION: 1. No acute fracture or dislocation. 2. Mild degenerative changes of the [facility] joint. Electronically Signed   By: Laren Everts.D.  On: 05/07/2020 18:34   DG HIP PORT UNILAT WITH PELVIS 1V RIGHT  Result Date: 05/07/2020 CLINICAL DATA:  Status post hip replacement EXAM: DG HIP (WITH OR WITHOUT PELVIS) 1V PORT RIGHT COMPARISON:  05/05/2020 FINDINGS: Status post right hip hemiarthroplasty with intact hardware and grossly normal alignment. Mild gas in the soft tissues consistent with recent surgery. IMPRESSION: Status post right hip hemiarthroplasty with expected postsurgical change Electronically Signed   By: Donavan Foil M.D.   On: 05/07/2020 18:33   DG HIP UNILAT WITH PELVIS 2-3 VIEWS RIGHT  Result Date: 05/05/2020 CLINICAL DATA:  Right hip pain EXAM: DG HIP (WITH OR WITHOUT PELVIS) 2-3V RIGHT COMPARISON:  None. FINDINGS: Chronic fracture right femoral neck with superior displacement. There is deformity of the femoral head which is small with associated degenerative change in the right hip. There is persistent lucency through the fracture likely due to pseudoarthrosis. No other fracture or bone lesion.  Lumbar fusion hardware noted. 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 Electronically Signed   By: Franchot Gallo M.D.   On: 05/05/2020 10:08    Subjective: Patient was feeling better when seen during morning rounds today.  No new complaint.  Pain is well controlled.  Waiting for transportation to go to SNF. Wound VAC is in place.  Discharge Exam: Vitals:   05/14/20 0324 05/14/20 0734  BP: 125/63 126/64  Pulse: 84 83  Resp: 16 15  Temp: 98.2 F (36.8 C) 98.2 F (36.8 C)  SpO2: 98% 97%    Vitals:   05/13/20 2017 05/13/20 2329 05/14/20 0324 05/14/20 0734  BP: 136/68 113/67 125/63 126/64  Pulse: 86 78 84 83  Resp: 16 17 16 15   Temp: 97.8 F (36.6 C) 97.6 F (36.4 C) 98.2 F (36.8 C) 98.2 F (36.8 C)  TempSrc: Oral Oral  Oral  SpO2: 97% 96% 98% 97%  Weight:      Height:        General: Pt is alert, awake, not in acute distress Cardiovascular: RRR, S1/S2 +, no rubs, no gallops Respiratory: CTA bilaterally, no wheezing, no rhonchi Abdominal: Soft, NT, ND, bowel sounds + Extremities: no edema, no cyanosis   The results of significant diagnostics from this hospitalization (including imaging, microbiology, ancillary and laboratory) are listed below for reference.    Microbiology: Recent Results (from the past 240 hour(s))  Respiratory Panel by RT PCR (Flu A&B, Covid) - Nasopharyngeal Swab     Status: None   Collection Time: 05/06/20 12:43 PM   Specimen: Nasopharyngeal Swab  Result Value Ref Range Status   SARS Coronavirus 2 by RT PCR NEGATIVE NEGATIVE Final    Comment: (NOTE) SARS-CoV-2 target nucleic acids are NOT DETECTED.  The SARS-CoV-2 RNA is generally detectable in upper respiratoy specimens during the acute phase of infection. The lowest concentration of SARS-CoV-2 viral copies this assay can detect is 131 copies/mL. A negative result does not preclude SARS-Cov-2 infection and should not be used as the sole basis for treatment or other patient management decisions. A negative result may occur with  improper specimen collection/handling, submission of specimen other than nasopharyngeal swab, presence of viral mutation(s) within the areas targeted by this assay, and inadequate number of viral copies (<131 copies/mL). A negative result must be combined with clinical observations, patient history, and epidemiological information. The expected result is Negative.  Fact Sheet for Patients:  PinkCheek.be  Fact Sheet for  Healthcare Providers:  GravelBags.it  This test is no t yet approved or cleared by the [place] and  has been authorized for detection and/or diagnosis of SARS-CoV-2 by FDA under an Emergency Use Authorization (EUA). This EUA will remain  in effect (meaning this test can be used) for the duration of the COVID-19 declaration under Section 564(b)(1) of the Act, 21 U.S.C. section 360bbb-3(b)(1), unless the authorization is terminated or revoked sooner.     Influenza A by PCR NEGATIVE NEGATIVE Final   Influenza B by PCR NEGATIVE NEGATIVE Final    Comment: (NOTE) The Xpert Xpress SARS-CoV-2/FLU/RSV assay is intended as an aid in  the diagnosis of influenza from Nasopharyngeal swab specimens and  should not be used as a sole basis for treatment. Nasal washings and  aspirates are unacceptable for Xpert Xpress SARS-CoV-2/FLU/RSV  testing.  Fact Sheet for Patients: PinkCheek.be  Fact Sheet for Healthcare Providers: GravelBags.it  This test is not yet approved or cleared by the [place] FDA and  has been authorized for detection and/or diagnosis of SARS-CoV-2 by  FDA under an Emergency Use Authorization (EUA). This EUA will remain  in effect (meaning this test can be used) for the duration of the  Covid-19 declaration under Section 564(b)(1) of the Act, 21  U.S.C. section 360bbb-3(b)(1), unless the authorization is  terminated or revoked. Performed at [facility], [address] [place], [place] [address]      Labs: BNP (last 3 results) No results for input(s): BNP in the last 8760 hours. Basic Metabolic Panel: Recent Labs  Lab 05/08/20 0623 05/08/20 0623 05/09/20 0555 05/10/20 0614 05/11/20 0502 05/12/20 0735 05/14/20 0423  NA 133*   < > 130* 128* 129* 133* 132*  K 4.3  --  3.9  --  4.1 4.6 4.4  CL 100  --  99  --  99 97* 98  CO2 27  --  25  --  24 28 27    GLUCOSE 123*  --  108*  --  119* 105* 109*  BUN 22  --  45*  --  38* 21 17  CREATININE 0.74  --  0.89  --  0.85 0.71 0.60  CALCIUM 8.5*  --  8.3*  --  8.2* 8.0* 7.8*   < > = values in this interval not displayed.   Liver Function Tests: No results for input(s): AST, ALT, ALKPHOS, BILITOT, PROT, ALBUMIN in the last 168 hours. No results for input(s): LIPASE, AMYLASE in the last 168 hours. No results for input(s): AMMONIA in the last 168 hours. CBC: Recent Labs  Lab 05/09/20 2353 05/09/20 2353 05/10/20 0614 05/11/20 0502 05/12/20 0735 05/13/20 0554 05/14/20 0423  WBC 14.0*  --  11.5* 9.5 7.5  --  7.3  NEUTROABS  --   --   --   --  4.9  --   --   HGB 7.1*   < > 7.1* 6.9* 7.4* 7.3* 7.5*  HCT 21.6*   < > 21.5* 21.3* 22.4* 22.9* 23.1*  MCV 89.6  --  89.2 92.2 90.7  --  92.4  PLT 218  --  222 260 281  --  360   < > = values in this interval not displayed.   Cardiac Enzymes: No results for input(s): CKTOTAL, CKMB, CKMBINDEX, TROPONINI in the last 168 hours. BNP: Invalid input(s): POCBNP CBG: No results for input(s): GLUCAP in the last 168 hours. D-Dimer No results for input(s): DDIMER in the last 72 hours. Hgb A1c No results for input(s):  HGBA1C in the last 72 hours. Lipid Profile No results for input(s): CHOL, HDL, LDLCALC, TRIG, CHOLHDL, LDLDIRECT in the last 72 hours. Thyroid function studies No results for input(s): TSH, T4TOTAL, T3FREE, THYROIDAB in the last 72 hours.  Invalid input(s): FREET3 Anemia work up Recent Labs    05/13/20 0554  VITAMINB12 259  FOLATE 10.2  FERRITIN 149  TIBC 188*  IRON 24*  RETICCTPCT 3.2*   Urinalysis    Component Value Date/Time   COLORURINE YELLOW 12/02/2017 1249   APPEARANCEUR HAZY (A) 12/02/2017 1249   APPEARANCEUR CLEAR 07/23/2014 1444   LABSPEC 1.010 12/02/2017 1249   LABSPEC 1.005 07/23/2014 1444   PHURINE 6.5 12/02/2017 1249   GLUCOSEU NEGATIVE 12/02/2017 1249   GLUCOSEU NEGATIVE 07/23/2014 1444   HGBUR TRACE (A)  12/02/2017 1249   BILIRUBINUR NEGATIVE 12/02/2017 1249   [place] 07/23/2014 1444   KETONESUR NEGATIVE 12/02/2017 1249   PROTEINUR NEGATIVE 12/02/2017 1249   NITRITE POSITIVE (A) 12/02/2017 1249   LEUKOCYTESUR SMALL (A) 12/02/2017 1249   LEUKOCYTESUR NEGATIVE 07/23/2014 1444   Sepsis Labs Invalid input(s): PROCALCITONIN,  WBC,  LACTICIDVEN Microbiology Recent Results (from the past 240 hour(s))  Respiratory Panel by RT PCR (Flu A&B, Covid) - Nasopharyngeal Swab     Status: None   Collection Time: 05/06/20 12:43 PM   Specimen: Nasopharyngeal Swab  Result Value Ref Range Status   SARS Coronavirus 2 by RT PCR NEGATIVE NEGATIVE Final    Comment: (NOTE) SARS-CoV-2 target nucleic acids are NOT DETECTED.  The SARS-CoV-2 RNA is generally detectable in upper respiratoy specimens during the acute phase of infection. The lowest concentration of SARS-CoV-2 viral copies this assay can detect is 131 copies/mL. A negative result does not preclude SARS-Cov-2 infection and should not be used as the sole basis for treatment or other patient management decisions. A negative result may occur with  improper specimen collection/handling, submission of specimen other than nasopharyngeal swab, presence of viral mutation(s) within the areas targeted by this assay, and inadequate number of viral copies (<131 copies/mL). A negative result must be combined with clinical observations, patient history, and epidemiological information. The expected result is Negative.  Fact Sheet for Patients:  PinkCheek.be  Fact Sheet for Healthcare Providers:  GravelBags.it  This test is no t yet approved or cleared by the [place] FDA and  has been authorized for detection and/or diagnosis of SARS-CoV-2 by FDA under an Emergency Use Authorization (EUA). This EUA will remain  in effect (meaning this test can be used) for the duration of  the COVID-19 declaration under Section 564(b)(1) of the Act, 21 U.S.C. section 360bbb-3(b)(1), unless the authorization is terminated or revoked sooner.     Influenza A by PCR NEGATIVE NEGATIVE Final   Influenza B by PCR NEGATIVE NEGATIVE Final    Comment: (NOTE) The Xpert Xpress SARS-CoV-2/FLU/RSV assay is intended as an aid in  the diagnosis of influenza from Nasopharyngeal swab specimens and  should not be used as a sole basis for treatment. Nasal washings and  aspirates are unacceptable for Xpert Xpress SARS-CoV-2/FLU/RSV  testing.  Fact Sheet for Patients: PinkCheek.be  Fact Sheet for Healthcare Providers: GravelBags.it  This test is not yet approved or cleared by the [place] FDA and  has been authorized for detection and/or diagnosis of SARS-CoV-2 by  FDA under an Emergency Use Authorization (EUA). This EUA will remain  in effect (meaning this test can be used) for the duration of the  Covid-19 declaration under Section 564(b)(1) of the  Act, 21  U.S.C. section 360bbb-3(b)(1), unless the authorization is  terminated or revoked. Performed at [facility], [address] [place],  [address]     Time coordinating discharge: Over 30 minutes  SIGNED:  Lorella Nimrod, MD  [facility] 05/14/2020, 10:50 AM  If 7PM-7AM, please contact night-coverage [email or web address]  This record has been created using Systems analyst. Errors have been sought and corrected,but may not always be located. Such creation errors do not reflect on the standard of care.

## 2020-05-14 NOTE — TOC Progression Note (Signed)
Transition of Care [facility]) - Progression Note    Patient Details  Name: Stacy Allen MRN: [id number] Date of Birth: 1941/06/04  [facility]) CM/SW Contact  Shelbie Ammons, RN Phone Number: 05/14/2020, 7:58 AM  Clinical Narrative:   RNCM received insurance authorization for skilled nursing placement for patient from [facility] beginning 11/17 with next review date of 11/19. Plan auth ID# [id number].     Expected Discharge Plan: [place] Barriers to Discharge: Continued Medical Work up  Expected Discharge Plan and Services Expected Discharge Plan: [place]   Discharge Planning Services: CM Consult Post Acute Care Choice: [name] Living arrangements for the past 2 months: Single Family Home                                       Social Determinants of Health (SDOH) Interventions    Readmission Risk Interventions No flowsheet data found.

## 2020-06-03 ENCOUNTER — Other Ambulatory Visit: Payer: Self-pay | Admitting: Orthopedic Surgery

## 2020-06-03 ENCOUNTER — Encounter: Payer: Self-pay | Admitting: *Deleted

## 2020-06-03 ENCOUNTER — Emergency Department
Admission: EM | Admit: 2020-06-03 | Discharge: 2020-06-04 | Disposition: A | Discharge status: Home / Self Care | Payer: Medicare HMO | Attending: Emergency Medicine | Admitting: Emergency Medicine

## 2020-06-03 ENCOUNTER — Other Ambulatory Visit: Payer: Self-pay

## 2020-06-03 ENCOUNTER — Ambulatory Visit
Admission: RE | Admit: 2020-06-03 | Discharge: 2020-06-03 | Disposition: A | Discharge status: Home / Self Care | Payer: Medicare HMO | Source: Ambulatory Visit | Attending: Orthopedic Surgery | Admitting: Orthopedic Surgery

## 2020-06-03 DIAGNOSIS — Z85828 Personal history of other malignant neoplasm of skin: Secondary | ICD-10-CM | POA: Diagnosis not present

## 2020-06-03 DIAGNOSIS — M79604 Pain in right leg: Secondary | ICD-10-CM | POA: Diagnosis present

## 2020-06-03 DIAGNOSIS — Z87891 Personal history of nicotine dependence: Secondary | ICD-10-CM | POA: Insufficient documentation

## 2020-06-03 DIAGNOSIS — Z9861 Coronary angioplasty status: Secondary | ICD-10-CM | POA: Insufficient documentation

## 2020-06-03 DIAGNOSIS — I2510 Atherosclerotic heart disease of native coronary artery without angina pectoris: Secondary | ICD-10-CM | POA: Diagnosis not present

## 2020-06-03 DIAGNOSIS — R600 Localized edema: Secondary | ICD-10-CM

## 2020-06-03 DIAGNOSIS — I82411 Acute embolism and thrombosis of right femoral vein: Secondary | ICD-10-CM | POA: Diagnosis not present

## 2020-06-03 DIAGNOSIS — Z96641 Presence of right artificial hip joint: Secondary | ICD-10-CM | POA: Diagnosis not present

## 2020-06-03 DIAGNOSIS — I10 Essential (primary) hypertension: Secondary | ICD-10-CM | POA: Insufficient documentation

## 2020-06-03 DIAGNOSIS — Z91040 Latex allergy status: Secondary | ICD-10-CM | POA: Diagnosis not present

## 2020-06-03 DIAGNOSIS — N3000 Acute cystitis without hematuria: Secondary | ICD-10-CM | POA: Diagnosis not present

## 2020-06-03 DIAGNOSIS — E039 Hypothyroidism, unspecified: Secondary | ICD-10-CM | POA: Diagnosis not present

## 2020-06-03 DIAGNOSIS — Z7982 Long term (current) use of aspirin: Secondary | ICD-10-CM | POA: Diagnosis not present

## 2020-06-03 DIAGNOSIS — Z79899 Other long term (current) drug therapy: Secondary | ICD-10-CM | POA: Insufficient documentation

## 2020-06-03 DIAGNOSIS — Z8739 Personal history of other diseases of the musculoskeletal system and connective tissue: Secondary | ICD-10-CM | POA: Diagnosis not present

## 2020-06-03 LAB — URINALYSIS, COMPLETE (UACMP) WITH MICROSCOPIC
Bacteria, UA: NONE SEEN
Bilirubin Urine: NEGATIVE
Glucose, UA: NEGATIVE mg/dL
Ketones, ur: 5 mg/dL — AB
Nitrite: NEGATIVE
Protein, ur: 30 mg/dL — AB
Specific Gravity, Urine: 1.018 (ref 1.005–1.030)
pH: 5 (ref 5.0–8.0)

## 2020-06-03 LAB — CBC
HCT: 32 % — ABNORMAL LOW (ref 36.0–46.0)
Hemoglobin: 10 g/dL — ABNORMAL LOW (ref 12.0–15.0)
MCH: 30.1 pg (ref 26.0–34.0)
MCHC: 31.3 g/dL (ref 30.0–36.0)
MCV: 96.4 fL (ref 80.0–100.0)
Platelets: 269 10*3/uL (ref 150–400)
RBC: 3.32 MIL/uL — ABNORMAL LOW (ref 3.87–5.11)
RDW: 16 % — ABNORMAL HIGH (ref 11.5–15.5)
WBC: 7.2 10*3/uL (ref 4.0–10.5)
nRBC: 0 % (ref 0.0–0.2)

## 2020-06-03 LAB — BASIC METABOLIC PANEL
Anion gap: 10 (ref 5–15)
BUN: 19 mg/dL (ref 8–23)
CO2: 25 mmol/L (ref 22–32)
Calcium: 8.7 mg/dL — ABNORMAL LOW (ref 8.9–10.3)
Chloride: 104 mmol/L (ref 98–111)
Creatinine, Ser: 0.87 mg/dL (ref 0.44–1.00)
GFR, Estimated: >60 mL/min (ref >60)
Glucose, Bld: 119 mg/dL — ABNORMAL HIGH (ref 70–99)
Potassium: 3.3 mmol/L — ABNORMAL LOW (ref 3.5–5.1)
Sodium: 139 mmol/L (ref 135–145)

## 2020-06-03 MED ORDER — TIZANIDINE HCL 4 MG PO TABS
4.0000 mg | ORAL_TABLET | Freq: Four times a day (QID) | ORAL | Status: DC | PRN
  Filled 2020-06-03: qty 1

## 2020-06-03 MED ORDER — CEPHALEXIN 500 MG PO CAPS: 500.0000 mg | ORAL_CAPSULE | Freq: Once | ORAL | Status: DC

## 2020-06-03 MED ORDER — CEPHALEXIN 500 MG PO CAPS: 500.0000 mg | ORAL_CAPSULE | Freq: Four times a day (QID) | ORAL | 0 refills | Status: AC

## 2020-06-03 MED ORDER — CEPHALEXIN 500 MG PO CAPS
500.0000 mg | ORAL_CAPSULE | Freq: Once | ORAL | Status: AC
  Administered 2020-06-03: 500 mg via ORAL
  Filled 2020-06-03: qty 1

## 2020-06-03 MED ORDER — POTASSIUM CHLORIDE 20 MEQ PO PACK
40.0000 meq | PACK | Freq: Once | ORAL | Status: DC
  Filled 2020-06-03: qty 2

## 2020-06-03 MED ORDER — APIXABAN 5 MG PO TABS: ORAL_TABLET | ORAL | 2 refills | Status: DC

## 2020-06-03 MED ORDER — POTASSIUM CHLORIDE CRYS ER 20 MEQ PO TBCR
40.0000 meq | EXTENDED_RELEASE_TABLET | Freq: Once | ORAL | Status: AC
  Administered 2020-06-03: 40 meq via ORAL
  Filled 2020-06-03: qty 2

## 2020-06-03 MED ORDER — GABAPENTIN 600 MG PO TABS
600.0000 mg | ORAL_TABLET | Freq: Three times a day (TID) | ORAL | Status: DC
  Administered 2020-06-04: 600 mg via ORAL
  Filled 2020-06-03: qty 1

## 2020-06-03 MED ORDER — APIXABAN 5 MG PO TABS: 10.0000 mg | ORAL_TABLET | Freq: Once | ORAL | Status: DC

## 2020-06-03 MED ORDER — METOPROLOL TARTRATE 25 MG PO TABS
37.5000 mg | ORAL_TABLET | Freq: Two times a day (BID) | ORAL | Status: DC
  Administered 2020-06-04: 37.5 mg via ORAL
  Filled 2020-06-03: qty 2

## 2020-06-03 MED ORDER — ENOXAPARIN SODIUM 120 MG/0.8ML ~~LOC~~ SOLN
1.0000 mg/kg | Freq: Once | SUBCUTANEOUS | Status: DC
  Filled 2020-06-03: qty 0.8

## 2020-06-03 MED ORDER — APIXABAN 5 MG PO TABS
10.0000 mg | ORAL_TABLET | Freq: Once | ORAL | Status: AC
  Administered 2020-06-03: 10 mg via ORAL
  Filled 2020-06-03: qty 2

## 2020-06-03 NOTE — ED Triage Notes (Signed)
Pt to triage via wheelchair.  Pt has  DVT in right leg and right leg pain for 1 week.   No sob or chest pain.  Pt alert  Speech clear.

## 2020-06-03 NOTE — ED Provider Notes (Signed)
[facility] Emergency Department Provider Note ____________________________________________   First MD Initiated Contact with Patient 06/03/20 2019     (approximate)  I have reviewed the triage vital signs and the nursing notes.  HISTORY  Chief Complaint Leg Pain   HPI Stacy Allen is a 79 y.o. femalewho presents to the ED for evaluation of leg pain.  Chart review indicates 11/11-11/18 hospital admission due to a right femoral neck fracture after mechanical fall, requiring hemiarthroplasty done on 11/9. Patient was discharged to rehab, and she has since been discharged home.  She is currently staying with her niece and nephew.  Ambulatory with a walker.  Denies recent falls or injury, fevers or illnesses.  Patient reports noticing swelling and erythema primarily to the posterior aspect of her right calf, popliteal fossa and thigh over the past 1 week.  She reports following up with orthopedics, Dr. Mack Guise, 3 days ago and an outpatient venous ultrasound was performed today demonstrating evidence of acute DVT.  Patient was called by Dr. Mack Guise today and directed to the ED.  Patient denies any chest pain, shortness of breath, pleuritic chest pains or dyspnea.  She reports the pain and swelling of her leg is improved than it was earlier this week.  Currently reporting 3/10 intensity mild and aching pain to her popliteal fossa.  Additionally, patient is reporting a couple episodes of urinary incontinence that is new for her.  She denies any dysuria, but is also reporting the sensation of incomplete emptying for the past few days.  Past Medical History:  Diagnosis Date  . Adenomatous polyps   . Allergic state   . Anemia   . Anginal pain ([place])   . Anxiety   . Arthritis    osteoarthritis  . Broken rib    left  . Cancer ([place])    skin  . Chicken pox   . Complication of anesthesia    respiratory distress after rectocele surgery  . Coronary artery  disease   . Depression   . Eczema   . Fibromyalgia   . GERD (gastroesophageal reflux disease)   . Headache    migraines  . Hemorrhoids   . History of hiatal hernia   . Hypercalcemia   . Hyperlipidemia   . Hypertension   . Hypothyroidism   . Lumbar stenosis   . Obesity   . Peptic ulcer disease   . Shortness of breath dyspnea   . Sleep apnea    No CPAP  . Vertigo   . Vitamin D deficiency     Patient Active Problem List   Diagnosis Date Noted  . Shoulder pain, right   . Closed right hip fracture ([place]) 05/06/2020  . Essential hypertension 05/06/2020  . Hypothyroidism 05/06/2020  . Lumbar spondylosis 06/01/2017  . Lumbar degenerative disc disease 06/01/2017  . History of lumbar fusion 06/01/2017  . 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 Chronic pain syndrome 06/01/2017  . 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 ABDOMINAL HYSTERECTOMY    . BACK SURGERY  2007   Dr. Mauri Pole, [facility], Spinal Fusion  . CARDIAC CATHETERIZATION N/A 04/03/2015   Procedure: Left Heart Cath and Coronary Angiography;  Surgeon: Teodoro Spray, MD;  Location: [place] [facility];  Service: Cardiovascular;  Laterality: N/A;  . CATARACT EXTRACTION W/PHACO Left 04/22/2019   Procedure: CATARACT EXTRACTION PHACO AND INTRAOCULAR LENS PLACEMENT (IOC) LEFT  00:45.4  12.9%  6.03;  Surgeon: Edison Pace,  Josie Saunders, MD;  Location: [place];  Service: Ophthalmology;  Laterality: Left;  Latex sleep apnea  . CATARACT EXTRACTION W/PHACO Right 07/01/2019   Procedure: CATARACT EXTRACTION PHACO AND INTRAOCULAR LENS PLACEMENT (IOC) RIGHT 2.38  00:24.4;  Surgeon: Eulogio Bear, MD;  Location: [place];  Service: Ophthalmology;  Laterality: Right;  Latex Sleep apnea  . CHOLECYSTECTOMY    . CORONARY ANGIOPLASTY  2015   Dr. Ubaldo Glassing, [facility]  . ESOPHAGOGASTRODUODENOSCOPY (EGD) WITH PROPOFOL N/A 12/21/2016   Procedure: ESOPHAGOGASTRODUODENOSCOPY (EGD) WITH  PROPOFOL;  Surgeon: Manya Silvas, MD;  Location: [facility] ENDOSCOPY;  Service: Endoscopy;  Laterality: N/A;  . FRACTURE SURGERY Left 1991   Fractures Femur, [place] Right 05/07/2020   Procedure: ARTHROPLASTY RIGHT HIP (HEMIARTHROPLASTY);  Surgeon: Thornton Park, MD;  Location: [facility];  Service: Orthopedics;  Laterality: Right;  . IMAGE GUIDED SINUS SURGERY Bilateral 09/28/2015   Procedure: IMAGE GUIDED SINUS SURGERY, SEPTOPLASTY, BILATERAL FRONTAL SINUSOTOMIES, BILATERAL MAXILLARY ANTROSTOMIES, BILATERAL TOTAL ETHMOIDECTOMY, BILATERAL SPHENOIDECTOMY, BILATERAL INFERIOR TURBINATE REDUCTION;  Surgeon: Margaretha Sheffield, MD;  Location: [facility];  Service: ENT;  Laterality: Bilateral;  . RECTOCELE REPAIR      Prior to Admission medications   Medication Sig Start Date End Date Taking? Authorizing Provider  acetaminophen (TYLENOL) 500 MG tablet Take 500 mg by mouth every 6 (six) hours as needed.    [provider]  albuterol (PROVENTIL HFA;VENTOLIN HFA) 108 (90 BASE) MCG/ACT inhaler Inhale 2 puffs into the lungs.    [provider]  alum & mag hydroxide-simeth (MAALOX/MYLANTA) 200-200-20 MG/5ML suspension Take 30 mLs by mouth every 4 (four) hours as needed for indigestion. 05/14/20   Lorella Nimrod, MD  amLODipine (NORVASC) 10 MG tablet Take 10 mg by mouth daily. 05/05/20   [provider]  apixaban (ELIQUIS) 5 MG TABS tablet Take 2 tablets (10mg ) twice daily for 7 days, then 1 tablet (5mg ) twice daily 06/03/20 09/01/20  Vladimir Crofts, MD  aspirin 81 MG chewable tablet Chew by mouth daily. Patient not taking: Reported on 05/06/2020    [provider]  calcium carbonate (OS-CAL - DOSED IN MG OF ELEMENTAL CALCIUM) 1250 (500 Ca) MG tablet Take 1 tablet (500 mg of elemental calcium total) by mouth daily with breakfast. 05/14/20   Lorella Nimrod, MD  cephALEXin (KEFLEX) 500 MG capsule Take 1 capsule (500 mg total) by mouth 4 (four) times daily for 5  days. 06/03/20 06/08/20  Vladimir Crofts, MD  cetirizine (ZYRTEC) 10 MG tablet Take 1 tablet (10 mg total) by mouth daily. If needed at night for itching not relieved by Claritin in the morning. 05/14/20   Lorella Nimrod, MD  citalopram (CELEXA) 40 MG tablet Take 40 mg by mouth daily.    [provider]  clonazePAM (KLONOPIN) 1 MG tablet Take 1 mg by mouth 2 (two) times daily. As needed for anxiety    [provider]  diclofenac sodium (VOLTAREN) 1 % GEL Apply 2 g topically 4 (four) times daily.    [provider]  docusate sodium (COLACE) 100 MG capsule Take 1 capsule (100 mg total) by mouth 2 (two) times daily. 05/14/20   Lorella Nimrod, MD  Ensure Max Protein (ENSURE MAX PROTEIN) LIQD Take 330 mLs (11 oz total) by mouth daily. 05/14/20   Lorella Nimrod, MD  EPINEPHrine 0.3 mg/0.3 mL IJ SOAJ injection Inject 0.3 mg into the muscle once. 04/20/20   [provider]  ergocalciferol (VITAMIN D2) 50000 UNITS capsule  Take 50,000 Units by mouth every 30 (thirty) days.    [provider]  fluticasone (VERAMYST) 27.5 MCG/SPRAY nasal spray Place 2 sprays into the nose daily. 05/14/20   Lorella Nimrod, MD  gabapentin (NEURONTIN) 600 MG tablet Take 600 mg by mouth 3 (three) times daily. 04/24/20   [provider]  hydrOXYzine (ATARAX/VISTARIL) 10 MG tablet Take 1 tablet (10 mg total) by mouth 3 (three) times daily as needed. 05/14/20   Lorella Nimrod, MD  Iron, Ferrous Sulfate, 325 (65 Fe) MG TABS Take by mouth 2 (two) times daily.    [provider]  levothyroxine (SYNTHROID) 137 MCG tablet Take 137 mcg by mouth daily. 04/21/20   [provider]  lidocaine (LIDODERM) 5 % Place 1 patch onto the skin daily. Remove & Discard patch within 12 hours or as directed by MD 05/14/20   Lorella Nimrod, MD  metoprolol tartrate (LOPRESSOR) 25 MG tablet Take 37.5 mg by mouth 2 (two) times daily.     [provider]  mirtazapine (REMERON) 15 MG tablet  Take 15 mg by mouth at bedtime. 04/07/20   [provider]  nitroGLYCERIN (NITROSTAT) 0.4 MG SL tablet Place 0.4 mg under the tongue every 5 (five) minutes as needed for chest pain.    [provider]  omeprazole (PRILOSEC) 20 MG capsule Take 40 mg by mouth daily.     [provider]  oxyCODONE (OXY IR/ROXICODONE) 5 MG immediate release tablet Take 1-2 tablets (5-10 mg total) by mouth every 4 (four) hours as needed for moderate pain (pain score 4-6). 05/14/20   Lorella Nimrod, MD  Polyethyl Glycol-Propyl Glycol (SYSTANE) 0.4-0.3 % GEL ophthalmic gel Place 1 application into both eyes 2 (two) times daily as needed (dry eyes).    [provider]  polyethylene glycol (MIRALAX / GLYCOLAX) 17 g packet Take 17 g by mouth daily. 05/14/20   Lorella Nimrod, MD  polyvinyl alcohol (LIQUIFILM TEARS) 1.4 % ophthalmic solution Place 1 drop into both eyes daily. 05/14/20   Lorella Nimrod, MD  simethicone (MYLICON) 80 MG chewable tablet Chew 1 tablet (80 mg total) by mouth 4 (four) times daily as needed for flatulence. 05/14/20   Lorella Nimrod, MD  tiZANidine (ZANAFLEX) 4 MG tablet Take 1 tablet (4 mg total) by mouth every 6 (six) hours as needed for muscle spasms. 05/14/20   Lorella Nimrod, MD  loratadine (CLARITIN) 10 MG tablet Take 1 tablet (10 mg total) by mouth daily. Take 1 tablet in the morning. As needed for itching. 02/14/17 02/17/19  Frederich Cha, MD  ranitidine (ZANTAC) 150 MG capsule Take 1 capsule (150 mg total) by mouth 2 (two) times daily. 02/14/17 02/17/19  Frederich Cha, MD    Allergies Zithromax [azithromycin], Adhesive [tape], Codeine, Lactose intolerance (gi), Morphine and related, Red dye, Shellfish-derived products, Latex, Lodine [etodolac], Penicillins, and Pravastatin  Family History  Problem Relation Age of Onset  . Hypertension Mother   . Cancer Mother   . Diabetes Father   . Hypertension Father   . Heart failure Father   . Breast cancer Neg Hx      Social History Social History   Tobacco Use  . Smoking status: Former Smoker    Types: Cigarettes    Quit date: 2000    Years since quitting: 21.9  . Smokeless tobacco: Never Used  Vaping Use  . Vaping Use: Never used  Substance Use Topics  . Alcohol use: No  . Drug use: No    Review of  Systems  Constitutional: No fever/chills Eyes: No visual changes. ENT: No sore throat. Cardiovascular: Denies chest pain. Respiratory: Denies shortness of breath. Gastrointestinal: No abdominal pain.  No nausea, no vomiting.  No diarrhea.  No constipation. Genitourinary: Negative for dysuria.  Positive for urinary incontinence and sensation of incomplete emptying. Musculoskeletal: Negative for back pain.  Positive for atraumatic right leg swelling and pain. Skin: Negative for rash. Neurological: Negative for headaches, focal weakness or numbness.  ____________________________________________   PHYSICAL EXAM:  VITAL SIGNS: Vitals:   06/03/20 1800 06/03/20 2133  BP: 137/83 137/84  Pulse: 94 81  Resp: 18 16  Temp:    SpO2: 98% 99%     Constitutional: Alert and oriented. Well appearing and in no acute distress. Eyes: Conjunctivae are normal. PERRL. EOMI. Head: Atraumatic. Nose: No congestion/rhinnorhea. Mouth/Throat: Mucous membranes are moist.  Oropharynx non-erythematous. Neck: No stridor. No cervical spine tenderness to palpation. Cardiovascular: Normal rate, regular rhythm. Grossly normal heart sounds.  Good peripheral circulation. Respiratory: Normal respiratory effort.  No retractions. Lungs CTAB. Gastrointestinal: Soft , nondistended. No CVA tenderness.  Mild suprapubic tenderness without peritoneal features, otherwise benign abdomen. Musculoskeletal:  No signs of acute trauma. Mild and faint flat erythema to the posterior aspect of the right leg without evidence of trauma or rashes.  Right leg is diffusely mildly swollen compared to the left.  Tenderness to palpation  throughout the popliteal fossa and distal right posterior thigh. Right leg is distally neurovascularly intact. Neurologic:  Normal speech and language. No gross focal neurologic deficits are appreciated. No gait instability noted. Skin:  Skin is warm, dry and intact. No rash noted. Psychiatric: Mood and affect are normal. Speech and behavior are normal.  ____________________________________________   LABS (all labs ordered are listed, but only abnormal results are displayed)  Labs Reviewed  BASIC METABOLIC PANEL - Abnormal; Notable for the following components:      Result Value   Potassium 3.3 (*)    Glucose, Bld 119 (*)    Calcium 8.7 (*)    All other components within normal limits  CBC - Abnormal; Notable for the following components:   RBC 3.32 (*)    Hemoglobin 10.0 (*)    HCT 32.0 (*)    RDW 16.0 (*)    All other components within normal limits  URINALYSIS, COMPLETE (UACMP) WITH MICROSCOPIC - Abnormal; Notable for the following components:   Color, Urine YELLOW (*)    APPearance HAZY (*)    Hgb urine dipstick MODERATE (*)    Ketones, ur 5 (*)    Protein, ur 30 (*)    Leukocytes,Ua TRACE (*)    Non Squamous Epithelial PRESENT (*)    All other components within normal limits  URINE CULTURE   ____________________________________________  RADIOLOGY  ED MD interpretation: Venous ultrasound of the right leg performed earlier today as an outpatient reviewed by me with evidence of noncompressible veins consistent with acute DVT.  Official radiology report(s): US Venous Img Lower Unilateral Right (DVT)  Result Date: 06/03/2020 CLINICAL DATA:  Edema involving the right lower leg. Evaluate for DVT. EXAM: RIGHT LOWER EXTREMITY VENOUS DOPPLER ULTRASOUND TECHNIQUE: Gray-scale sonography with graded compression, as well as color Doppler and duplex ultrasound were performed to evaluate the lower extremity deep venous systems from the level of the common femoral vein and including  the common femoral, femoral, profunda femoral, popliteal and calf veins including the posterior tibial, peroneal and gastrocnemius veins when visible. The superficial great saphenous vein was also interrogated. Spectral  Doppler was utilized to evaluate flow at rest and with distal augmentation maneuvers in the common femoral, femoral and popliteal veins. COMPARISON:  Right lower extremity venous Doppler ultrasound-10/23/2014 (negative). FINDINGS: Contralateral Common Femoral Vein: Respiratory phasicity is normal and symmetric with the symptomatic side. No evidence of thrombus. Normal compressibility. Common Femoral Vein: No evidence of thrombus. Normal compressibility, respiratory phasicity and response to augmentation. Saphenofemoral Junction: No evidence of thrombus. Normal compressibility and flow on color Doppler imaging. Profunda Femoral Vein: No evidence of thrombus. Normal compressibility and flow on color Doppler imaging. Femoral Vein: There is hypoechoic near occlusive thrombus involving the proximal (image 21), mid (image 27) and distal (image 32) aspects of the right femoral vein. There is a tiny adjacent accessory/duplicated right femoral vein which appears patent. Popliteal Vein: There is hypoechoic near occlusive thrombus involving the right popliteal vein (images 35, 36 and 41). Calf Veins: The posterior tibial vein appears patent where imaged. The peroneal vein was suboptimally visualized. Superficial Great Saphenous Vein: No evidence of thrombus. Normal compressibility. Other Findings: There is an approximately 6.0 x 1.1 x 3.1 cm minimally complex fluid collection with the right popliteal fossa compatible with a Baker's cyst. There is a minimal subcutaneous edema at the level the right calf. IMPRESSION: 1. The examination is positive for near occlusive DVT extending from the proximal aspect of the right femoral vein through the right popliteal vein. 2. Incidentally noted approximately 6.0 cm  minimally complex right-sided Baker's cyst. Electronically Signed   By: Sandi Mariscal M.D.   On: 06/03/2020 14:19    ____________________________________________   PROCEDURES and INTERVENTIONS  Procedure(s) performed (including Critical Care):  Procedures  Medications  cephALEXin (KEFLEX) capsule 500 mg (has no administration in time range)  apixaban (ELIQUIS) tablet 10 mg (10 mg Oral Given 06/03/20 2139)  potassium chloride SA (KLOR-CON) CR tablet 40 mEq (40 mEq Oral Given 06/03/20 2139)    ____________________________________________   MDM / ED COURSE   79 year old woman 3 weeks postop from uncomplicated hemiarthroplasty presents with evidence of provoked DVT amenable to outpatient management with initiation of anticoagulation. Normal vitals on room air. Exam shows signs of DVT, but otherwise is unremarkable. She is in no respiratory distress, has no evidence of trauma and looks clinically well. She has no signs or symptoms of an acute PE. Outpatient venous ultrasound reviewed with evidence of DVT on the right, same side as her arthroplasty, consistent with a provoked DVT. Start the patient on a course of Eliquis, after confirming she is financially able to get this medication. Due to her symptoms of urinary incontinence and sensation of incomplete emptying, UA was performed and does show some signs of infection consistent with UTI/cystitis. We will start the patient on a course of Keflex and Eliquis to treat these conditions. She reports significant fear of discharge tonight, and requests to stay in the ED until morning. Considering the plan for outpatient management, it is not unreasonable to perform a short observation period prior to discharge. Patient will be held in a hallway bed overnight and discharged home, signed out to oncoming overnight provider.  Clinical Course as of Jun 03 2216  Wed Jun 03, 2020  2058 Estimated cost for her Eliquis is $45 for a month. Patient reports that she  is comfortable with this and is able to pay this amount. We discussed initiating Eliquis and outpatient management of her acute DVT, and she is eager and agreeable for this. She indicates that she feels fine and does not want  to be in the hospital. We discussed discharge, and she reports extreme discomfort with going home tonight and prefers to go home in the morning.    [DS]    Clinical Course User Index [DS] Vladimir Crofts, MD    ____________________________________________   FINAL CLINICAL IMPRESSION(S) / ED DIAGNOSES  Final diagnoses:  Acute deep vein thrombosis (DVT) of femoral vein of right lower extremity (HCC)  Right leg pain  Acute cystitis without hematuria     ED Discharge Orders         Ordered    apixaban (ELIQUIS) 5 MG TABS tablet        06/03/20 2057    cephALEXin (KEFLEX) 500 MG capsule  4 times daily        06/03/20 2148           Stacy Allen   Note:  This document was prepared using Dragon voice recognition software and may include unintentional dictation errors.   Vladimir Crofts, MD 06/03/20 2221

## 2020-06-03 NOTE — Discharge Instructions (Signed)
As we discussed, you have a blood clot in your right leg causing your swelling and pain. The treatment for this is blood thinners to prevent further blood clot accumulation is your body slowly breaks down the blood clot that is present.  You do have evidence of a UTI, so you are being discharged a prescription for Keflex antibiotic to take 4 times daily for the next 5 days to treat this infection. Please finish all of the antibiotic pills, even if your symptoms are going away.  Please call your primary care physician in the morning, to be seen in the clinic in the next few days. Like we talked about, this is something that can be managed outside the hospital, but it is important that you keep close contact with your PCP.  If you develop any worsening symptoms to your leg despite the medication, fevers, passing out or new chest pain/shortness of breath please return to the ED immediately.

## 2020-06-06 LAB — URINE CULTURE: Culture: >=100000 — AB

## 2020-08-08 ENCOUNTER — Emergency Department
Admission: EM | Admit: 2020-08-08 | Discharge: 2020-08-08 | Disposition: A | Discharge status: Home / Self Care | Payer: Medicare HMO | Attending: Emergency Medicine | Admitting: Emergency Medicine

## 2020-08-08 ENCOUNTER — Other Ambulatory Visit: Payer: Self-pay | Admitting: Orthopedic Surgery

## 2020-08-08 ENCOUNTER — Other Ambulatory Visit: Payer: Self-pay

## 2020-08-08 ENCOUNTER — Ambulatory Visit
Admission: RE | Admit: 2020-08-08 | Discharge: 2020-08-08 | Disposition: A | Discharge status: Home / Self Care | Payer: Medicare HMO | Source: Ambulatory Visit | Attending: Orthopedic Surgery | Admitting: Orthopedic Surgery

## 2020-08-08 DIAGNOSIS — I82401 Acute embolism and thrombosis of unspecified deep veins of right lower extremity: Secondary | ICD-10-CM | POA: Diagnosis not present

## 2020-08-08 DIAGNOSIS — Z87891 Personal history of nicotine dependence: Secondary | ICD-10-CM | POA: Insufficient documentation

## 2020-08-08 DIAGNOSIS — M79651 Pain in right thigh: Secondary | ICD-10-CM | POA: Diagnosis present

## 2020-08-08 DIAGNOSIS — Z96641 Presence of right artificial hip joint: Secondary | ICD-10-CM | POA: Insufficient documentation

## 2020-08-08 DIAGNOSIS — Z7901 Long term (current) use of anticoagulants: Secondary | ICD-10-CM | POA: Insufficient documentation

## 2020-08-08 DIAGNOSIS — M7989 Other specified soft tissue disorders: Secondary | ICD-10-CM | POA: Insufficient documentation

## 2020-08-08 DIAGNOSIS — O2230 Deep phlebothrombosis in pregnancy, unspecified trimester: Secondary | ICD-10-CM

## 2020-08-08 DIAGNOSIS — I10 Essential (primary) hypertension: Secondary | ICD-10-CM | POA: Insufficient documentation

## 2020-08-08 DIAGNOSIS — E039 Hypothyroidism, unspecified: Secondary | ICD-10-CM | POA: Diagnosis not present

## 2020-08-08 DIAGNOSIS — Z7982 Long term (current) use of aspirin: Secondary | ICD-10-CM | POA: Diagnosis not present

## 2020-08-08 DIAGNOSIS — Z79899 Other long term (current) drug therapy: Secondary | ICD-10-CM | POA: Diagnosis not present

## 2020-08-08 DIAGNOSIS — I2510 Atherosclerotic heart disease of native coronary artery without angina pectoris: Secondary | ICD-10-CM | POA: Insufficient documentation

## 2020-08-08 DIAGNOSIS — Z85828 Personal history of other malignant neoplasm of skin: Secondary | ICD-10-CM | POA: Insufficient documentation

## 2020-08-08 LAB — CBC
HCT: 37 % (ref 36.0–46.0)
Hemoglobin: 11.8 g/dL — ABNORMAL LOW (ref 12.0–15.0)
MCH: 28.9 pg (ref 26.0–34.0)
MCHC: 31.9 g/dL (ref 30.0–36.0)
MCV: 90.5 fL (ref 80.0–100.0)
Platelets: 328 10*3/uL (ref 150–400)
RBC: 4.09 MIL/uL (ref 3.87–5.11)
RDW: 14.2 % (ref 11.5–15.5)
WBC: 7.5 10*3/uL (ref 4.0–10.5)
nRBC: 0 % (ref 0.0–0.2)

## 2020-08-08 LAB — COMPREHENSIVE METABOLIC PANEL
ALT: 15 U/L (ref 0–44)
AST: 20 U/L (ref 15–41)
Albumin: 3.8 g/dL (ref 3.5–5.0)
Alkaline Phosphatase: 107 U/L (ref 38–126)
Anion gap: 9 (ref 5–15)
BUN: 18 mg/dL (ref 8–23)
CO2: 26 mmol/L (ref 22–32)
Calcium: 8.7 mg/dL — ABNORMAL LOW (ref 8.9–10.3)
Chloride: 100 mmol/L (ref 98–111)
Creatinine, Ser: 0.71 mg/dL (ref 0.44–1.00)
GFR, Estimated: >60 mL/min (ref >60)
Glucose, Bld: 111 mg/dL — ABNORMAL HIGH (ref 70–99)
Potassium: 3.9 mmol/L (ref 3.5–5.1)
Sodium: 135 mmol/L (ref 135–145)
Total Bilirubin: 0.5 mg/dL (ref 0.3–1.2)
Total Protein: 7.6 g/dL (ref 6.5–8.1)

## 2020-08-08 LAB — PROTIME-INR
INR: 1.3 — ABNORMAL HIGH (ref 0.8–1.2)
Prothrombin Time: 15.4 seconds — ABNORMAL HIGH (ref 11.4–15.2)

## 2020-08-08 MED ORDER — TRAMADOL HCL 50 MG PO TABS
50.0000 mg | ORAL_TABLET | Freq: Four times a day (QID) | ORAL | 0 refills | Status: DC | PRN
Start: 2020-08-08 — End: 2021-10-23

## 2020-08-08 NOTE — Discharge Instructions (Addendum)
As we discussed please follow-up with your primary care doctor for recheck/reevaluation and you will likely require an additional ultrasound in several months to ensure resolution.  Please take your Eliquis twice daily as prescribed.  You may take your pain medication as needed, as written.  Do not drink alcohol or drive while taking your pain medication.  Return to the emergency department for any symptom personally concerning to yourself.

## 2020-08-08 NOTE — ED Triage Notes (Signed)
Pt via POV from home. Pt went to [facility] for R leg pain for about 1 week. Pt had an [place] and has a positive nonocclusive R femoropopliteal DVT. Pt is A&Ox4 and NAD. Pt is on Eliquis.

## 2020-08-08 NOTE — ED Provider Notes (Signed)
[facility] Emergency Department Provider Note  Time seen: 8:46 PM  I have reviewed the triage vital signs and the nursing notes.   HISTORY  Chief Complaint DVT   HPI Stacy Allen is a 80 y.o. female with a past medical history of anemia, anxiety, depression, hypertension, hyperlipidemia, DVT on Eliquis who presents to the emergency department for DVT.  According to the patient she was diagnosed with a DVT in her right leg in December was started on Eliquis.  Patient states over the past few weeks she has only been taking her Eliquis once a day instead of the twice a day she was prescribed.  Patient states over the past week or so she has been experiencing pain in her anterior right thigh at times.  Had a repeat ultrasound performed by her orthopedic team today showing a DVT in the patient was called and told to come to the emergency department for treatment.  Patient denies any chest pain or worsening shortness of breath of her baseline.  No fever.  Largely negative review of systems otherwise.  No leg pain currently.   Past Medical History:  Diagnosis Date  . Adenomatous polyps   . Allergic state   . Anemia   . Anginal pain ([place])   . Anxiety   . Arthritis    osteoarthritis  . Broken rib    left  . Cancer ([place])    skin  . Chicken pox   . Complication of anesthesia    respiratory distress after rectocele surgery  . Coronary artery disease   . Depression   . Eczema   . Fibromyalgia   . GERD (gastroesophageal reflux disease)   . Headache    migraines  . Hemorrhoids   . History of hiatal hernia   . Hypercalcemia   . Hyperlipidemia   . Hypertension   . Hypothyroidism   . Lumbar stenosis   . Obesity   . Peptic ulcer disease   . Shortness of breath dyspnea   . Sleep apnea    No CPAP  . Vertigo   . Vitamin D deficiency     Patient Active Problem List   Diagnosis Date Noted  . Shoulder pain, right   . Closed right hip fracture ([place]) 05/06/2020   . Essential hypertension 05/06/2020  . Hypothyroidism 05/06/2020  . Lumbar spondylosis 06/01/2017  . Lumbar degenerative disc disease 06/01/2017  . History of lumbar fusion 06/01/2017  . 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 Chronic pain syndrome 06/01/2017  . 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 ABDOMINAL HYSTERECTOMY    . BACK SURGERY  2007   Dr. Mauri Pole, [facility], Spinal Fusion  . CARDIAC CATHETERIZATION N/A 04/03/2015   Procedure: Left Heart Cath and Coronary Angiography;  Surgeon: Teodoro Spray, MD;  Location: [facility];  Service: Cardiovascular;  Laterality: N/A;  . CATARACT EXTRACTION W/PHACO Left 04/22/2019   Procedure: CATARACT EXTRACTION PHACO AND INTRAOCULAR LENS PLACEMENT (IOC) LEFT  00:45.4  12.9%  6.03;  Surgeon: Eulogio Bear, MD;  Location: [facility];  Service: Ophthalmology;  Laterality: Left;  Latex sleep apnea  . CATARACT EXTRACTION W/PHACO Right 07/01/2019   Procedure: CATARACT EXTRACTION PHACO AND INTRAOCULAR LENS PLACEMENT (IOC) RIGHT 2.38  00:24.4;  Surgeon: Eulogio Bear, MD;  Location: [place];  Service: Ophthalmology;  Laterality: Right;  Latex Sleep apnea  . CHOLECYSTECTOMY    . CORONARY ANGIOPLASTY  2015  Dr. Ubaldo Glassing, [facility] Cath Lab  . ESOPHAGOGASTRODUODENOSCOPY (EGD) WITH PROPOFOL N/A 12/21/2016   Procedure: ESOPHAGOGASTRODUODENOSCOPY (EGD) WITH PROPOFOL;  Surgeon: Manya Silvas, MD;  Location: [facility] ENDOSCOPY;  Service: Endoscopy;  Laterality: N/A;  . FRACTURE SURGERY Left 1991   Fractures Femur, [place] Right 05/07/2020   Procedure: ARTHROPLASTY RIGHT HIP (HEMIARTHROPLASTY);  Surgeon: Thornton Park, MD;  Location: [facility];  Service: Orthopedics;  Laterality: Right;  . IMAGE GUIDED SINUS SURGERY Bilateral 09/28/2015   Procedure: IMAGE GUIDED SINUS SURGERY, SEPTOPLASTY, BILATERAL FRONTAL SINUSOTOMIES, BILATERAL MAXILLARY ANTROSTOMIES,  BILATERAL TOTAL ETHMOIDECTOMY, BILATERAL SPHENOIDECTOMY, BILATERAL INFERIOR TURBINATE REDUCTION;  Surgeon: Margaretha Sheffield, MD;  Location: [facility];  Service: ENT;  Laterality: Bilateral;  . RECTOCELE REPAIR      Prior to Admission medications   Medication Sig Start Date End Date Taking? Authorizing Provider  acetaminophen (TYLENOL) 500 MG tablet Take 500 mg by mouth every 6 (six) hours as needed.    [provider]  albuterol (PROVENTIL HFA;VENTOLIN HFA) 108 (90 BASE) MCG/ACT inhaler Inhale 2 puffs into the lungs.    [provider]  alum & mag hydroxide-simeth (MAALOX/MYLANTA) 200-200-20 MG/5ML suspension Take 30 mLs by mouth every 4 (four) hours as needed for indigestion. 05/14/20   Lorella Nimrod, MD  amLODipine (NORVASC) 10 MG tablet Take 10 mg by mouth daily. 05/05/20   [provider]  apixaban (ELIQUIS) 5 MG TABS tablet Take 2 tablets (10mg ) twice daily for 7 days, then 1 tablet (5mg ) twice daily 06/03/20 09/01/20  Vladimir Crofts, MD  aspirin 81 MG chewable tablet Chew by mouth daily. Patient not taking: Reported on 05/06/2020    [provider]  calcium carbonate (OS-CAL - DOSED IN MG OF ELEMENTAL CALCIUM) 1250 (500 Ca) MG tablet Take 1 tablet (500 mg of elemental calcium total) by mouth daily with breakfast. 05/14/20   Lorella Nimrod, MD  cetirizine (ZYRTEC) 10 MG tablet Take 1 tablet (10 mg total) by mouth daily. If needed at night for itching not relieved by Claritin in the morning. 05/14/20   Lorella Nimrod, MD  citalopram (CELEXA) 40 MG tablet Take 40 mg by mouth daily.    [provider]  clonazePAM (KLONOPIN) 1 MG tablet Take 1 mg by mouth 2 (two) times daily. As needed for anxiety    [provider]  diclofenac sodium (VOLTAREN) 1 % GEL Apply 2 g topically 4 (four) times daily.    [provider]  docusate sodium (COLACE) 100 MG capsule Take 1 capsule (100 mg total) by mouth 2 (two) times daily. 05/14/20   Lorella Nimrod, MD   Ensure Max Protein (ENSURE MAX PROTEIN) LIQD Take 330 mLs (11 oz total) by mouth daily. 05/14/20   Lorella Nimrod, MD  EPINEPHrine 0.3 mg/0.3 mL IJ SOAJ injection Inject 0.3 mg into the muscle once. 04/20/20   [provider]  ergocalciferol (VITAMIN D2) 50000 UNITS capsule Take 50,000 Units by mouth every 30 (thirty) days.    [provider]  fluticasone (VERAMYST) 27.5 MCG/SPRAY nasal spray Place 2 sprays into the nose daily. 05/14/20   Lorella Nimrod, MD  gabapentin (NEURONTIN) 600 MG tablet Take 600 mg by mouth 3 (three) times daily. 04/24/20   [provider]  hydrOXYzine (ATARAX/VISTARIL) 10 MG tablet Take 1 tablet (10 mg total) by mouth 3 (three) times daily as needed. 05/14/20   Lorella Nimrod, MD  Iron, Ferrous Sulfate, 325 (65 Fe) MG TABS Take by mouth 2 (two) times daily.  [provider]  levothyroxine (SYNTHROID) 137 MCG tablet Take 137 mcg by mouth daily. 04/21/20   [provider]  lidocaine (LIDODERM) 5 % Place 1 patch onto the skin daily. Remove & Discard patch within 12 hours or as directed by MD 05/14/20   Lorella Nimrod, MD  metoprolol tartrate (LOPRESSOR) 25 MG tablet Take 37.5 mg by mouth 2 (two) times daily.     [provider]  mirtazapine (REMERON) 15 MG tablet Take 15 mg by mouth at bedtime. 04/07/20   [provider]  nitroGLYCERIN (NITROSTAT) 0.4 MG SL tablet Place 0.4 mg under the tongue every 5 (five) minutes as needed for chest pain.    [provider]  omeprazole (PRILOSEC) 20 MG capsule Take 40 mg by mouth daily.     [provider]  oxyCODONE (OXY IR/ROXICODONE) 5 MG immediate release tablet Take 1-2 tablets (5-10 mg total) by mouth every 4 (four) hours as needed for moderate pain (pain score 4-6). 05/14/20   Lorella Nimrod, MD  Polyethyl Glycol-Propyl Glycol (SYSTANE) 0.4-0.3 % GEL ophthalmic gel Place 1 application into both eyes 2 (two) times daily as needed (dry eyes).    [provider]  polyethylene glycol (MIRALAX / GLYCOLAX) 17 g packet Take 17 g by mouth daily. 05/14/20   Lorella Nimrod, MD  polyvinyl alcohol (LIQUIFILM TEARS) 1.4 % ophthalmic solution Place 1 drop into both eyes daily. 05/14/20   Lorella Nimrod, MD  simethicone (MYLICON) 80 MG chewable tablet Chew 1 tablet (80 mg total) by mouth 4 (four) times daily as needed for flatulence. 05/14/20   Lorella Nimrod, MD  tiZANidine (ZANAFLEX) 4 MG tablet Take 1 tablet (4 mg total) by mouth every 6 (six) hours as needed for muscle spasms. 05/14/20   Lorella Nimrod, MD  loratadine (CLARITIN) 10 MG tablet Take 1 tablet (10 mg total) by mouth daily. Take 1 tablet in the morning. As needed for itching. 02/14/17 02/17/19  Frederich Cha, MD  ranitidine (ZANTAC) 150 MG capsule Take 1 capsule (150 mg total) by mouth 2 (two) times daily. 02/14/17 02/17/19  Frederich Cha, MD    Allergies  Allergen Reactions  . Zithromax [Azithromycin] Diarrhea    "fainting"  . Adhesive [Tape]     blisters  . Codeine Nausea And Vomiting  . Lactose Intolerance (Gi) Other (See Comments)    "stomach pain"  . Morphine And Related Hives  . Red Dye Itching    "red food dye"  . Shellfish-Derived Products Itching and Swelling  . Latex Rash    With extended contact  . Lodine [Etodolac] Rash  . Penicillins Rash    Given on 05/07/20.  No reaction noted.    . Pravastatin Rash    Family History  Problem Relation Age of Onset  . Hypertension Mother   . Cancer Mother   . Diabetes Father   . Hypertension Father   . Heart failure Father   . Breast cancer Neg Hx     Social History Social History   Tobacco Use  . Smoking status: Former Smoker    Types: Cigarettes    Quit date: 2000    Years since quitting: 22.1  . Smokeless tobacco: Never Used  Vaping Use  . Vaping Use: Never used  Substance Use Topics  . Alcohol use: No  . Drug use: No    Review of Systems Constitutional: Negative for fever. Cardiovascular: Negative for chest  pain. Respiratory: Negative for shortness of breath. Gastrointestinal: Negative for abdominal pain,  vomiting Musculoskeletal: Right lower extremity pain, moderate at times, gone currently. Neurological: Negative for headache All other ROS negative  ____________________________________________   PHYSICAL EXAM:  VITAL SIGNS: ED Triage Vitals  Enc Vitals Group     BP 08/08/20 1630 116/75     Pulse Rate 08/08/20 1630 66     Resp 08/08/20 1630 20     Temp 08/08/20 1630 97.9 F (36.6 C)     Temp Source 08/08/20 1630 Oral     SpO2 08/08/20 1630 94 %     Weight 08/08/20 1631 225 lb (102.1 kg)     Height 08/08/20 1631 5\' 3"  (1.6 m)     Head Circumference --      Peak Flow --      Pain Score 08/08/20 1631 5     Pain Loc --      Pain Edu? --      Excl. in [place]? --     Constitutional: Alert and oriented. Well appearing and in no distress. Eyes: Normal exam ENT      Head: Normocephalic and atraumatic.      Mouth/Throat: Mucous membranes are moist. Cardiovascular: Normal rate, regular rhythm. No murmur Respiratory: Normal respiratory effort without tachypnea nor retractions. Breath sounds are clear Gastrointestinal: Soft and nontender. No distention.   Musculoskeletal: Nontender with normal range of motion in all extremities.  No right lower extremity tenderness to palpation.  No popliteal tenderness.  2+ DP pulse.  No signs of cellulitis or skin color changes. Neurologic:  Normal speech and language. No gross focal neurologic deficits Skin:  Skin is warm, dry and intact.  Psychiatric: Mood and affect are normal.   ____________________________________________    RADIOLOGY  Outpatient ultrasound shows nonocclusive right lower extremity DVT  ____________________________________________   INITIAL IMPRESSION / ASSESSMENT AND PLAN / ED COURSE  Pertinent labs & imaging results that were available during my care of the patient were reviewed by me and considered in my medical  decision making (see chart for details).   Patient presents emergency department for right lower extremity pain diagnosed with a DVT.  I reviewed the patient's chart she was initially diagnosed 06/03/2020 with a right lower extremity DVT that was occlusive and fairly large.  [name] ultrasound today shows a nonocclusive clot that appears to have shrunken in size.  Patient is taking Eliquis however she was only taking it once daily instead of twice daily.  Patient denies any pain in her leg currently states she typically gets pain if she is up walking around on the leg.  However she also states the pain is in the anterior thigh.  Given the patient's ultrasound showing improved clot from 2 months ago, the patient very likely has a resolving DVT in the right lower extremity.  As the leg is otherwise well-appearing with a great vascular exam and a nonocclusive clot on DVT I believe the patient is safe for discharge home with PCP follow-up and Eliquis twice daily.  Patient agreeable to plan of care.  We will prescribe the patient a short course of tramadol to be used as needed for leg pain.  Stacy Allen was evaluated in Emergency Department on 08/08/2020 for the symptoms described in the history of present illness. She was evaluated in the context of the global COVID-19 pandemic, which necessitated consideration that the patient might be at risk for infection with the SARS-CoV-2 virus that causes COVID-19. Institutional protocols and algorithms that pertain to the evaluation of patients at risk for  COVID-19 are in a state of rapid change based on information released by regulatory bodies including the CDC and federal and state organizations. These policies and algorithms were followed during the patient's care in the ED.  ____________________________________________   FINAL CLINICAL IMPRESSION(S) / ED DIAGNOSES  DVT   Harvest Dark, MD 08/08/20 2051

## 2020-11-27 ENCOUNTER — Other Ambulatory Visit: Payer: Self-pay | Admitting: Cardiology

## 2020-11-27 DIAGNOSIS — R0789 Other chest pain: Secondary | ICD-10-CM

## 2020-12-04 ENCOUNTER — Other Ambulatory Visit: Payer: Self-pay

## 2020-12-04 ENCOUNTER — Ambulatory Visit
Admission: RE | Admit: 2020-12-04 | Discharge: 2020-12-04 | Disposition: A | Discharge status: Home / Self Care | Payer: Medicare HMO | Source: Ambulatory Visit | Attending: Cardiology | Admitting: Cardiology

## 2020-12-04 DIAGNOSIS — R1012 Left upper quadrant pain: Secondary | ICD-10-CM | POA: Diagnosis not present

## 2020-12-04 DIAGNOSIS — R0789 Other chest pain: Secondary | ICD-10-CM | POA: Diagnosis not present

## 2020-12-04 LAB — POCT I-STAT CREATININE: Creatinine, Ser: 0.7 mg/dL (ref 0.44–1.00)

## 2020-12-04 MED ORDER — IOHEXOL 300 MG/ML  SOLN
100.0000 mL | Freq: Once | INTRAMUSCULAR | Status: AC | PRN
  Administered 2020-12-04: 100 mL via INTRAVENOUS

## 2021-01-12 ENCOUNTER — Other Ambulatory Visit: Payer: Self-pay | Admitting: Physician Assistant

## 2021-01-12 ENCOUNTER — Other Ambulatory Visit: Payer: Self-pay

## 2021-01-12 ENCOUNTER — Ambulatory Visit
Admission: RE | Admit: 2021-01-12 | Discharge: 2021-01-12 | Disposition: A | Discharge status: Home / Self Care | Payer: Medicare HMO | Source: Ambulatory Visit | Attending: Physician Assistant | Admitting: Physician Assistant

## 2021-01-12 DIAGNOSIS — Z86718 Personal history of other venous thrombosis and embolism: Secondary | ICD-10-CM | POA: Diagnosis not present

## 2021-01-15 ENCOUNTER — Ambulatory Visit
Admission: EM | Admit: 2021-01-15 | Discharge: 2021-01-15 | Disposition: A | Discharge status: Home / Self Care | Payer: Medicare HMO | Attending: Sports Medicine | Admitting: Sports Medicine

## 2021-01-15 ENCOUNTER — Encounter: Payer: Self-pay | Admitting: Emergency Medicine

## 2021-01-15 ENCOUNTER — Other Ambulatory Visit: Payer: Self-pay

## 2021-01-15 DIAGNOSIS — R5383 Other fatigue: Secondary | ICD-10-CM | POA: Insufficient documentation

## 2021-01-15 DIAGNOSIS — Z20822 Contact with and (suspected) exposure to covid-19: Secondary | ICD-10-CM | POA: Insufficient documentation

## 2021-01-15 DIAGNOSIS — R52 Pain, unspecified: Secondary | ICD-10-CM

## 2021-01-15 DIAGNOSIS — R21 Rash and other nonspecific skin eruption: Secondary | ICD-10-CM | POA: Insufficient documentation

## 2021-01-15 DIAGNOSIS — R059 Cough, unspecified: Secondary | ICD-10-CM | POA: Diagnosis not present

## 2021-01-15 LAB — SARS CORONAVIRUS 2 (TAT 6-24 HRS): SARS Coronavirus 2: NEGATIVE

## 2021-01-15 NOTE — Discharge Instructions (Addendum)
-Use the ointment you have at home for the rash.  Follow-up with dermatology if not improving. -Continue taking your allergy medication.  Since your symptoms are worse at night when you lie down it could be related to allergies.  Consider getting another humidifier.  Increase rest and fluids.  Take NyQuil at night if you need to for cough.  We have obtained a COVID test and they will be back tomorrow.  Someone will call you if positive. -Follow-up with your orthopedist on Monday at your scheduled appointment to discuss her pain. -Go to the emergency department if you develop any acute shortness of breath, severe fatigue or weakness or severe pain. -Otherwise, follow-up with your PCP if not improving over the next several days to week.  You have received COVID testing today either for positive exposure, concerning symptoms that could be related to COVID infection, screening purposes, or re-testing after confirmed positive.  Your test obtained today checks for active viral infection in the last 1-2 weeks. If your test is negative now, you can still test positive later. So, if you do develop symptoms you should either get re-tested and/or isolate x 5 days and then strict mask use x 5 days (unvaccinated) or mask use x 10 days (vaccinated). Please follow CDC guidelines.  While Rapid antigen tests come back in 15-20 minutes, send out PCR/molecular test results typically come back within 1-3 days. In the mean time, if you are symptomatic, assume this could be a positive test and treat/monitor yourself as if you do have COVID.   We will call with test results if positive. Please download the MyChart app and set up a profile to access test results.   If symptomatic, go home and rest. Push fluids. Take Tylenol as needed for discomfort. Gargle warm salt water. Throat lozenges. Take Mucinex DM or Robitussin for cough. Humidifier in bedroom to ease coughing. Warm showers. Also review the COVID handout for more  information.  COVID-19 INFECTION: The incubation period of COVID-19 is approximately 14 days after exposure, with most symptoms developing in roughly 4-5 days. Symptoms may range in severity from mild to critically severe. Roughly 80% of those infected will have mild symptoms. People of any age may become infected with COVID-19 and have the ability to transmit the virus. The most common symptoms include: fever, fatigue, cough, body aches, headaches, sore throat, nasal congestion, shortness of breath, nausea, vomiting, diarrhea, changes in smell and/or taste.    COURSE OF ILLNESS Some patients may begin with mild disease which can progress quickly into critical symptoms. If your symptoms are worsening please call ahead to the Emergency Department and proceed there for further treatment. Recovery time appears to be roughly 1-2 weeks for mild symptoms and 3-6 weeks for severe disease.   GO IMMEDIATELY TO ER FOR FEVER YOU ARE UNABLE TO GET DOWN WITH TYLENOL, BREATHING PROBLEMS, CHEST PAIN, FATIGUE, LETHARGY, INABILITY TO EAT OR DRINK, ETC  QUARANTINE AND ISOLATION: To help decrease the spread of COVID-19 please remain isolated if you have COVID infection or are highly suspected to have COVID infection. This means -stay home and isolate to one room in the home if you live with others. Do not share a bed or bathroom with others while ill, sanitize and wipe down all countertops and keep common areas clean and disinfected. Stay home for 5 days. If you have no symptoms or your symptoms are resolving after 5 days, you can leave your house. Continue to wear a mask around others  for 5 additional days. If you have been in close contact (within 6 feet) of someone diagnosed with COVID 19, you are advised to quarantine in your home for 14 days as symptoms can develop anywhere from 2-14 days after exposure to the virus. If you develop symptoms, you  must isolate.  Most current guidelines for COVID after  exposure -unvaccinated: isolate 5 days and strict mask use x 5 days. Test on day 5 is possible -vaccinated: wear mask x 10 days if symptoms do not develop -You do not necessarily need to be tested for COVID if you have + exposure and  develop symptoms. Just isolate at home x10 days from symptom onset During this global pandemic, CDC advises to practice social distancing, try to stay at least 50f away from others at all times. Wear a face covering. Wash and sanitize your hands regularly and avoid going anywhere that is not necessary.  KEEP IN MIND THAT THE COVID TEST IS NOT 100% ACCURATE AND YOU SHOULD STILL DO EVERYTHING TO PREVENT POTENTIAL SPREAD OF VIRUS TO OTHERS (WEAR MASK, WEAR GLOVES, [place]S AND SANITIZE REGULARLY). IF INITIAL TEST IS NEGATIVE, THIS MAY NOT MEAN YOU ARE DEFINITELY NEGATIVE. MOST ACCURATE TESTING IS DONE 5-7 DAYS AFTER EXPOSURE.   It is not advised by CDC to get re-tested after receiving a positive COVID test since you can still test positive for weeks to months after you have already cleared the virus.   *If you have not been vaccinated for COVID, I strongly suggest you consider getting vaccinated as long as there are no contraindications.

## 2021-01-15 NOTE — ED Triage Notes (Signed)
Pt c/o cough, body aches, runny nose, fatigue, and rash (neck and face). Started about 5 days ago.

## 2021-01-15 NOTE — ED Provider Notes (Signed)
[facility]    CSN: [id number] Arrival date & time: 01/15/21  0951      History   Chief Complaint Chief Complaint  Patient presents with   Generalized Body Aches   Rash   Cough    HPI Stacy Allen is a 80 y.o. female presenting for approximately 5-day history increased fatigue and generalized body aches.  She also says that she has a cough and runny nose at night only.  Patient denies any fevers, chills or sweats.  She denies sore throat, chest pain or breathing difficulty.  Patient states that she has had multiple red bumps show up on her neck and face and she is concerned about possibility of shingles.  Patient states that she has had this similar rash in the past and her dermatologist has given her an ointment.  She has not recently used it but says it did not help before.  The bumps are apparently itchy.  Patient also recently seen by neurology for multiple complaints and had DVT study performed.  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  Patient has appointment with her orthopedist on Monday because she has chronic hip pain and sciatica.  She is unsure if that is related to her current fatigue and achiness.  No sick contacts and no known exposure to COVID-19.  No other complaints or concerns.  HPI  Past Medical History:  Diagnosis Date   Adenomatous polyps    Allergic state    Anemia    Anginal pain (HCC)    Anxiety    Arthritis    osteoarthritis   Broken rib    left   Cancer (HCC)    skin   Chicken pox    Complication of anesthesia    respiratory distress after rectocele surgery   Coronary artery disease    Depression    Eczema    Fibromyalgia    GERD (gastroesophageal reflux disease)    Headache    migraines   Hemorrhoids    History of hiatal hernia    Hypercalcemia    Hyperlipidemia    Hypertension    Hypothyroidism    Lumbar stenosis    Obesity    Peptic ulcer disease    Shortness of breath dyspnea    Sleep apnea    No CPAP    Vertigo    Vitamin D deficiency     Patient Active Problem List   Diagnosis Date Noted   Shoulder pain, right    Closed right hip fracture ([place]) 05/06/2020   Essential hypertension 05/06/2020   Hypothyroidism 05/06/2020   Lumbar spondylosis 06/01/2017   Lumbar degenerative disc disease 06/01/2017   History of lumbar fusion 06/01/2017   Age related osteoporosis 06/01/2017   Chronic pain syndrome 06/01/2017   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 Mauri Pole, [facility], Spinal Fusion   CARDIAC CATHETERIZATION N/A 04/03/2015   Procedure: Left Heart Cath and Coronary Angiography;  Surgeon: Teodoro Spray, MD;  Location: [facility];  Service: Cardiovascular;  Laterality: N/A;   CATARACT EXTRACTION W/PHACO Left 04/22/2019   Procedure: CATARACT EXTRACTION PHACO AND INTRAOCULAR LENS PLACEMENT (IOC) LEFT  00:45.4  12.9%  6.03;  Surgeon: Eulogio Bear, MD;  Location: [facility];  Service: Ophthalmology;  Laterality: Left;  Latex sleep apnea   CATARACT EXTRACTION W/PHACO Right 07/01/2019  Procedure: CATARACT EXTRACTION PHACO AND INTRAOCULAR LENS PLACEMENT (IOC) RIGHT 2.38  00:24.4;  Surgeon: Eulogio Bear, MD;  Location: [facility];  Service: Ophthalmology;  Laterality: Right;  Latex Sleep apnea   CHOLECYSTECTOMY     CORONARY ANGIOPLASTY  2015   Dr. Ubaldo Glassing, [facility]   ESOPHAGOGASTRODUODENOSCOPY (EGD) WITH PROPOFOL N/A 12/21/2016   Procedure: ESOPHAGOGASTRODUODENOSCOPY (EGD) WITH PROPOFOL;  Surgeon: Manya Silvas, MD;  Location: [facility];  Service: Endoscopy;  Laterality: N/A;   FRACTURE SURGERY Left 1991   Fractures Femur, [facility]   HIP ARTHROPLASTY Right 05/07/2020   Procedure: ARTHROPLASTY RIGHT HIP (HEMIARTHROPLASTY);  Surgeon: Thornton Park, MD;  Location: [facility];  Service: Orthopedics;  Laterality: Right;   IMAGE GUIDED SINUS SURGERY  Bilateral 09/28/2015   Procedure: IMAGE GUIDED SINUS SURGERY, SEPTOPLASTY, BILATERAL FRONTAL SINUSOTOMIES, BILATERAL MAXILLARY ANTROSTOMIES, BILATERAL TOTAL ETHMOIDECTOMY, BILATERAL SPHENOIDECTOMY, BILATERAL INFERIOR TURBINATE REDUCTION;  Surgeon: Margaretha Sheffield, MD;  Location: [facility];  Service: ENT;  Laterality: Bilateral;   RECTOCELE REPAIR      OB History   No obstetric history on file.      Home Medications    Prior to Admission medications   Medication Sig Start Date End Date Taking? Authorizing Provider  acetaminophen (TYLENOL) 500 MG tablet Take 500 mg by mouth every 6 (six) hours as needed.   Yes [provider]  albuterol (PROVENTIL HFA;VENTOLIN HFA) 108 (90 BASE) MCG/ACT inhaler Inhale 2 puffs into the lungs.   Yes [provider]  amLODipine (NORVASC) 10 MG tablet Take 10 mg by mouth daily. 05/05/20  Yes [provider]  apixaban (ELIQUIS) 5 MG TABS tablet Take 2 tablets ('10mg'$ ) twice daily for 7 days, then 1 tablet ('5mg'$ ) twice daily 06/03/20 01/15/21 Yes Vladimir Crofts, MD  aspirin 81 MG chewable tablet Chew by mouth daily.   Yes [provider]  cetirizine (ZYRTEC) 10 MG tablet Take 1 tablet (10 mg total) by mouth daily. If needed at night for itching not relieved by Claritin in the morning. 05/14/20  Yes Lorella Nimrod, MD  citalopram (CELEXA) 40 MG tablet Take 40 mg by mouth daily.   Yes [provider]  clonazePAM (KLONOPIN) 1 MG tablet Take 1 mg by mouth 2 (two) times daily. As needed for anxiety   Yes [provider]  gabapentin (NEURONTIN) 600 MG tablet Take 600 mg by mouth 3 (three) times daily. 04/24/20  Yes [provider]  levothyroxine (SYNTHROID) 137 MCG tablet Take 137 mcg by mouth daily. 04/21/20  Yes [provider]  metoprolol tartrate (LOPRESSOR) 25 MG tablet Take 37.5 mg by mouth 2 (two) times daily.    Yes [provider]  mirtazapine (REMERON) 15 MG tablet Take 15 mg by mouth at bedtime.  04/07/20  Yes [provider]  nitroGLYCERIN (NITROSTAT) 0.4 MG SL tablet Place 0.4 mg under the tongue every 5 (five) minutes as needed for chest pain.   Yes [provider]  omeprazole (PRILOSEC) 20 MG capsule Take 40 mg by mouth daily.    Yes [provider]  Polyethyl Glycol-Propyl Glycol (SYSTANE) 0.4-0.3 % GEL ophthalmic gel Place 1 application into both eyes 2 (two) times daily as needed (dry eyes).   Yes [provider]  alum & mag hydroxide-simeth (MAALOX/MYLANTA) 200-200-20 MG/5ML suspension Take 30 mLs by mouth every 4 (four) hours as needed for indigestion. 05/14/20   Lorella Nimrod, MD  calcium carbonate (OS-CAL - DOSED IN MG OF ELEMENTAL CALCIUM) 1250 (500 Ca) MG tablet  Take 1 tablet (500 mg of elemental calcium total) by mouth daily with breakfast. 05/14/20   Lorella Nimrod, MD  diclofenac sodium (VOLTAREN) 1 % GEL Apply 2 g topically 4 (four) times daily.    [provider]  docusate sodium (COLACE) 100 MG capsule Take 1 capsule (100 mg total) by mouth 2 (two) times daily. 05/14/20   Lorella Nimrod, MD  Ensure Max Protein (ENSURE MAX PROTEIN) LIQD Take 330 mLs (11 oz total) by mouth daily. 05/14/20   Lorella Nimrod, MD  EPINEPHrine 0.3 mg/0.3 mL IJ SOAJ injection Inject 0.3 mg into the muscle once. 04/20/20   [provider]  ergocalciferol (VITAMIN D2) 50000 UNITS capsule Take 50,000 Units by mouth every 30 (thirty) days.    [provider]  fluticasone (VERAMYST) 27.5 MCG/SPRAY nasal spray Place 2 sprays into the nose daily. 05/14/20   Lorella Nimrod, MD  hydrOXYzine (ATARAX/VISTARIL) 10 MG tablet Take 1 tablet (10 mg total) by mouth 3 (three) times daily as needed. 05/14/20   Lorella Nimrod, MD  Iron, Ferrous Sulfate, 325 (65 Fe) MG TABS Take by mouth 2 (two) times daily.    [provider]  lidocaine (LIDODERM) 5 % Place 1 patch onto the skin daily. Remove & Discard patch within 12 hours or as directed by MD 05/14/20    Lorella Nimrod, MD  oxyCODONE (OXY IR/ROXICODONE) 5 MG immediate release tablet Take 1-2 tablets (5-10 mg total) by mouth every 4 (four) hours as needed for moderate pain (pain score 4-6). 05/14/20   Lorella Nimrod, MD  polyethylene glycol (MIRALAX / GLYCOLAX) 17 g packet Take 17 g by mouth daily. 05/14/20   Lorella Nimrod, MD  polyvinyl alcohol (LIQUIFILM TEARS) 1.4 % ophthalmic solution Place 1 drop into both eyes daily. 05/14/20   Lorella Nimrod, MD  simethicone (MYLICON) 80 MG chewable tablet Chew 1 tablet (80 mg total) by mouth 4 (four) times daily as needed for flatulence. 05/14/20   Lorella Nimrod, MD  tiZANidine (ZANAFLEX) 4 MG tablet Take 1 tablet (4 mg total) by mouth every 6 (six) hours as needed for muscle spasms. 05/14/20   Lorella Nimrod, MD  traMADol (ULTRAM) 50 MG tablet Take 1 tablet (50 mg total) by mouth every 6 (six) hours as needed. 08/08/20   Harvest Dark, MD  loratadine (CLARITIN) 10 MG tablet Take 1 tablet (10 mg total) by mouth daily. Take 1 tablet in the morning. As needed for itching. 02/14/17 02/17/19  Frederich Cha, MD  ranitidine (ZANTAC) 150 MG capsule Take 1 capsule (150 mg total) by mouth 2 (two) times daily. 02/14/17 02/17/19  Frederich Cha, MD    Family History Family History  Problem Relation Age of Onset   Hypertension Mother    Cancer Mother    Diabetes Father    Hypertension Father    Heart failure Father    Breast cancer Neg Hx     Social History Social History   Tobacco Use   Smoking status: Former    Types: Cigarettes    Quit date: 2000    Years since quitting: 22.5   Smokeless tobacco: Never  Vaping Use   Vaping Use: Never used  Substance Use Topics   Alcohol use: No   Drug use: No     Allergies   Zithromax [azithromycin], Adhesive [tape], Codeine, Lactose intolerance (gi), Morphine and related, Red dye, Shellfish-derived products, Latex, Lodine [etodolac], Penicillins, and Pravastatin   Review of Systems Review of Systems   Constitutional:  Positive for fatigue. Negative  for chills, diaphoresis and fever.  HENT:  Positive for congestion and rhinorrhea. Negative for ear pain, sinus pressure, sinus pain and sore throat.   Respiratory:  Positive for cough. Negative for shortness of breath.   Cardiovascular:  Negative for chest pain.  Gastrointestinal:  Negative for abdominal pain, nausea and vomiting.  Genitourinary:  Negative for dysuria.  Musculoskeletal:  Positive for back pain (chronic) and myalgias. Negative for arthralgias (chronic).  Skin:  Positive for rash.  Neurological:  Negative for weakness and headaches.  Hematological:  Negative for adenopathy.    Physical Exam Triage Vital Signs ED Triage Vitals  Enc Vitals Group     BP 01/15/21 1106 (!) 158/78     Pulse Rate 01/15/21 1106 67     Resp 01/15/21 1106 18     Temp 01/15/21 1106 98 F (36.7 C)     Temp Source 01/15/21 1106 Oral     SpO2 01/15/21 1106 100 %     Weight 01/15/21 1101 225 lb 1.4 oz (102.1 kg)     Height 01/15/21 1101 '5\' 3"'$  (1.6 m)     Head Circumference --      Peak Flow --      Pain Score 01/15/21 1101 6     Pain Loc --      Pain Edu? --      Excl. in [place]? --    No data found.  Updated Vital Signs BP (!) 158/78 (BP Location: Left Arm)   Pulse 67   Temp 98 F (36.7 C) (Oral)   Resp 18   Ht '5\' 3"'$  (1.6 m)   Wt 225 lb 1.4 oz (102.1 kg)   SpO2 100%   BMI 39.87 kg/m       Physical Exam Vitals and nursing note reviewed.  Constitutional:      General: She is not in acute distress.    Appearance: Normal appearance. She is obese. She is not ill-appearing or toxic-appearing.  HENT:     Head: Normocephalic and atraumatic.     Nose: Nose normal.     Mouth/Throat:     Mouth: Mucous membranes are moist.     Pharynx: Oropharynx is clear.  Eyes:     General: No scleral icterus.       Right eye: No discharge.        Left eye: No discharge.     Conjunctiva/sclera: Conjunctivae normal.  Cardiovascular:     Rate and  Rhythm: Normal rate and regular rhythm.     Heart sounds: Normal heart sounds.  Pulmonary:     Effort: Pulmonary effort is normal. No respiratory distress.     Breath sounds: Normal breath sounds. No wheezing, rhonchi or rales.  Musculoskeletal:     Cervical back: Neck supple.  Skin:    General: Skin is dry.     Findings: Rash (there are a few scattered erythematous papules of neck, 1 under right eye) present.  Neurological:     General: No focal deficit present.     Mental Status: She is alert. Mental status is at baseline.     Motor: No weakness.     Gait: Gait abnormal (uses walker).  Psychiatric:        Mood and Affect: Mood normal.        Behavior: Behavior normal.        Thought Content: Thought content normal.     UC Treatments / Results  Labs (all labs ordered are listed, but only abnormal  results are displayed) Labs Reviewed  SARS CORONAVIRUS 2 (TAT 6-24 HRS)    EKG   Radiology No results found.  Procedures Procedures (including critical care time)  Medications Ordered in UC Medications - No data to display  Initial Impression / Assessment and Plan / UC Course  I have reviewed the triage vital signs and the nursing notes.  Pertinent labs & imaging results that were available during my care of the patient were reviewed by me and considered in my medical decision making (see chart for details).  80 year old female presenting for multiple complaints including cough and congestion at night, fatigue and diffuse body aches.  Symptoms ongoing x5 days.  Additionally she has a few erythematous papules of her neck and face.  No contact with any known allergens.  Vitals are stable.  Blood pressure is a little elevated at 158/78.  The rest of the vital signs are stable.  Exam is significant for a few scattered erythematous papules of neck and one under the right eye.  Does not appear to be consistent with shingles.  The areas are nontender.  The muscle consistent with bug  bites.  I offered triamcinolone ointment but she says she has at home.  Advised her to use on follow-up with her dermatologist since she has had this similar rash in the past.  PCR COVID test obtained.  Current CDC guidelines, isolation protocol and ED precautions reviewed to patient.  Patient does have history of allergies and takes Zyrtec.  Advised her that since her symptoms are worse at night when she lies down she could be having postnasal drainage causing some of her symptoms.  Encouraged her to increase rest and fluids and consider use of humidifier.  I did offer cough medication but she declines.  Advised patient to keep appointment with her orthopedist as likely if some of her body aches are coming from chronic back pain and her other chronic pain issues.  ED precautions thoroughly reviewed with patient.   Final Clinical Impressions(s) / UC Diagnoses   Final diagnoses:  Fatigue, unspecified type  Generalized body aches  Cough  Rash and nonspecific skin eruption     Discharge Instructions      -Use the ointment you have at home for the rash.  Follow-up with dermatology if not improving. -Continue taking your allergy medication.  Since your symptoms are worse at night when you lie down it could be related to allergies.  Consider getting another humidifier.  Increase rest and fluids.  Take NyQuil at night if you need to for cough.  We have obtained a COVID test and they will be back tomorrow.  Someone will call you if positive. -Follow-up with your orthopedist on Monday at your scheduled appointment to discuss her pain. -Go to the emergency department if you develop any acute shortness of breath, severe fatigue or weakness or severe pain. -Otherwise, follow-up with your PCP if not improving over the next several days to week.  You have received COVID testing today either for positive exposure, concerning symptoms that could be related to COVID infection, screening purposes, or  re-testing after confirmed positive.  Your test obtained today checks for active viral infection in the last 1-2 weeks. If your test is negative now, you can still test positive later. So, if you do develop symptoms you should either get re-tested and/or isolate x 5 days and then strict mask use x 5 days (unvaccinated) or mask use x 10 days (vaccinated). Please follow CDC guidelines.  While Rapid antigen tests come back in 15-20 minutes, send out PCR/molecular test results typically come back within 1-3 days. In the mean time, if you are symptomatic, assume this could be a positive test and treat/monitor yourself as if you do have COVID.   We will call with test results if positive. Please download the MyChart app and set up a profile to access test results.   If symptomatic, go home and rest. Push fluids. Take Tylenol as needed for discomfort. Gargle warm salt water. Throat lozenges. Take Mucinex DM or Robitussin for cough. Humidifier in bedroom to ease coughing. Warm showers. Also review the COVID handout for more information.  COVID-19 INFECTION: The incubation period of COVID-19 is approximately 14 days after exposure, with most symptoms developing in roughly 4-5 days. Symptoms may range in severity from mild to critically severe. Roughly 80% of those infected will have mild symptoms. People of any age may become infected with COVID-19 and have the ability to transmit the virus. The most common symptoms include: fever, fatigue, cough, body aches, headaches, sore throat, nasal congestion, shortness of breath, nausea, vomiting, diarrhea, changes in smell and/or taste.    COURSE OF ILLNESS Some patients may begin with mild disease which can progress quickly into critical symptoms. If your symptoms are worsening please call ahead to the Emergency Department and proceed there for further treatment. Recovery time appears to be roughly 1-2 weeks for mild symptoms and 3-6 weeks for severe disease.   GO  IMMEDIATELY TO ER FOR FEVER YOU ARE UNABLE TO GET DOWN WITH TYLENOL, BREATHING PROBLEMS, CHEST PAIN, FATIGUE, LETHARGY, INABILITY TO EAT OR DRINK, ETC  QUARANTINE AND ISOLATION: To help decrease the spread of COVID-19 please remain isolated if you have COVID infection or are highly suspected to have COVID infection. This means -stay home and isolate to one room in the home if you live with others. Do not share a bed or bathroom with others while ill, sanitize and wipe down all countertops and keep common areas clean and disinfected. Stay home for 5 days. If you have no symptoms or your symptoms are resolving after 5 days, you can leave your house. Continue to wear a mask around others for 5 additional days. If you have been in close contact (within 6 feet) of someone diagnosed with COVID 19, you are advised to quarantine in your home for 14 days as symptoms can develop anywhere from 2-14 days after exposure to the virus. If you develop symptoms, you  must isolate.  Most current guidelines for COVID after exposure -unvaccinated: isolate 5 days and strict mask use x 5 days. Test on day 5 is possible -vaccinated: wear mask x 10 days if symptoms do not develop -You do not necessarily need to be tested for COVID if you have + exposure and  develop symptoms. Just isolate at home x10 days from symptom onset During this global pandemic, CDC advises to practice social distancing, try to stay at least 55f away from others at all times. Wear a face covering. Wash and sanitize your hands regularly and avoid going anywhere that is not necessary.  KEEP IN MIND THAT THE COVID TEST IS NOT 100% ACCURATE AND YOU SHOULD STILL DO EVERYTHING TO PREVENT POTENTIAL SPREAD OF VIRUS TO OTHERS (WEAR MASK, WEAR GLOVES, WUnion DepositHANDS AND SANITIZE REGULARLY). IF INITIAL TEST IS NEGATIVE, THIS MAY NOT MEAN YOU ARE DEFINITELY NEGATIVE. MOST ACCURATE TESTING IS DONE 5-7 DAYS AFTER EXPOSURE.   It is not advised by [facility]  to get re-tested  after receiving a positive COVID test since you can still test positive for weeks to months after you have already cleared the virus.   *If you have not been vaccinated for COVID, I strongly suggest you consider getting vaccinated as long as there are no contraindications.       ED Prescriptions   None    PDMP not reviewed this encounter.   Danton Clap, PA-C 01/15/21 1212

## 2021-01-25 ENCOUNTER — Emergency Department: Payer: Medicare HMO

## 2021-01-25 ENCOUNTER — Emergency Department
Admission: EM | Admit: 2021-01-25 | Discharge: 2021-01-25 | Disposition: A | Discharge status: Home / Self Care | Payer: Medicare HMO | Attending: Emergency Medicine | Admitting: Emergency Medicine

## 2021-01-25 DIAGNOSIS — R42 Dizziness and giddiness: Secondary | ICD-10-CM

## 2021-01-25 DIAGNOSIS — Z79899 Other long term (current) drug therapy: Secondary | ICD-10-CM | POA: Insufficient documentation

## 2021-01-25 DIAGNOSIS — I2510 Atherosclerotic heart disease of native coronary artery without angina pectoris: Secondary | ICD-10-CM | POA: Insufficient documentation

## 2021-01-25 DIAGNOSIS — E039 Hypothyroidism, unspecified: Secondary | ICD-10-CM | POA: Insufficient documentation

## 2021-01-25 DIAGNOSIS — R079 Chest pain, unspecified: Secondary | ICD-10-CM | POA: Diagnosis not present

## 2021-01-25 DIAGNOSIS — Z87891 Personal history of nicotine dependence: Secondary | ICD-10-CM | POA: Insufficient documentation

## 2021-01-25 DIAGNOSIS — M79661 Pain in right lower leg: Secondary | ICD-10-CM | POA: Insufficient documentation

## 2021-01-25 DIAGNOSIS — Z91040 Latex allergy status: Secondary | ICD-10-CM | POA: Insufficient documentation

## 2021-01-25 DIAGNOSIS — I10 Essential (primary) hypertension: Secondary | ICD-10-CM | POA: Insufficient documentation

## 2021-01-25 DIAGNOSIS — Z7982 Long term (current) use of aspirin: Secondary | ICD-10-CM | POA: Insufficient documentation

## 2021-01-25 DIAGNOSIS — R0789 Other chest pain: Secondary | ICD-10-CM

## 2021-01-25 DIAGNOSIS — Z85828 Personal history of other malignant neoplasm of skin: Secondary | ICD-10-CM | POA: Diagnosis not present

## 2021-01-25 DIAGNOSIS — Z96641 Presence of right artificial hip joint: Secondary | ICD-10-CM | POA: Diagnosis not present

## 2021-01-25 DIAGNOSIS — R0602 Shortness of breath: Secondary | ICD-10-CM | POA: Insufficient documentation

## 2021-01-25 LAB — BASIC METABOLIC PANEL
Anion gap: 6 (ref 5–15)
BUN: 25 mg/dL — ABNORMAL HIGH (ref 8–23)
CO2: 27 mmol/L (ref 22–32)
Calcium: 8.3 mg/dL — ABNORMAL LOW (ref 8.9–10.3)
Chloride: 103 mmol/L (ref 98–111)
Creatinine, Ser: 0.79 mg/dL (ref 0.44–1.00)
GFR, Estimated: >60 mL/min (ref >60)
Glucose, Bld: 98 mg/dL (ref 70–99)
Potassium: 3.7 mmol/L (ref 3.5–5.1)
Sodium: 136 mmol/L (ref 135–145)

## 2021-01-25 LAB — CBC
HCT: 33.4 % — ABNORMAL LOW (ref 36.0–46.0)
Hemoglobin: 10.6 g/dL — ABNORMAL LOW (ref 12.0–15.0)
MCH: 26.1 pg (ref 26.0–34.0)
MCHC: 31.7 g/dL (ref 30.0–36.0)
MCV: 82.3 fL (ref 80.0–100.0)
Platelets: 333 10*3/uL (ref 150–400)
RBC: 4.06 MIL/uL (ref 3.87–5.11)
RDW: 17.3 % — ABNORMAL HIGH (ref 11.5–15.5)
WBC: 11 10*3/uL — ABNORMAL HIGH (ref 4.0–10.5)
nRBC: 0 % (ref 0.0–0.2)

## 2021-01-25 LAB — BRAIN NATRIURETIC PEPTIDE: B Natriuretic Peptide: 121.4 pg/mL — ABNORMAL HIGH (ref 0.0–100.0)

## 2021-01-25 LAB — TROPONIN I (HIGH SENSITIVITY)
Troponin I (High Sensitivity): 7 ng/L (ref <18)
Troponin I (High Sensitivity): 6 ng/L (ref <18)

## 2021-01-25 LAB — D-DIMER, QUANTITATIVE: D-Dimer, Quant: 0.65 ug/mL-FEU — ABNORMAL HIGH (ref 0.00–0.50)

## 2021-01-25 MED ORDER — IOHEXOL 350 MG/ML SOLN
75.0000 mL | Freq: Once | INTRAVENOUS | Status: AC | PRN
  Administered 2021-01-25: 75 mL via INTRAVENOUS

## 2021-01-25 NOTE — Discharge Instructions (Addendum)
Please follow up with cardiology and primary care.  Return to the ER for symptoms that change or worsen if unable to schedule an appointment.

## 2021-01-25 NOTE — ED Triage Notes (Signed)
Patient presents to ER with chest pressure for 2 days. Patient reports hx of DVT to right leg for approx 1 year. Patient reports she is taking eliquis. Patient reports she has had some groin pain and worsening pain to right calf for 1 week. Patient A&OX3.

## 2021-01-25 NOTE — ED Provider Notes (Signed)
[facility] Emergency Department Provider Note ____________________________________________   Event Date/Time   First MD Initiated Contact with Patient 01/25/21 1213     (approximate)  I have reviewed the triage vital signs and the nursing notes.   HISTORY  Chief Complaint Chest Pain  HPI Stacy Allen is a 80 y.o. female with history of vertigo, hypertension, CAD, anxiety, DVT and remaining history as listed below presents to the emergency department for treatment and evaluation of dizziness, chest pressure, and pain in right lower extremity. Dizziness and chest pressure have been present for the past 3 days and have progressively worsened. She reports compliance with medications including Eliquis over the past month. She is curious if Eliquis could contribute to her symptoms as the longer she takes it, the more symptomatic she becomes. Leg pain is no worse today than it has been.         Past Medical History:  Diagnosis Date   Adenomatous polyps    Allergic state    Anemia    Anginal pain (HCC)    Anxiety    Arthritis    osteoarthritis   Broken rib    left   Cancer (HCC)    skin   Chicken pox    Complication of anesthesia    respiratory distress after rectocele surgery   Coronary artery disease    Depression    Eczema    Fibromyalgia    GERD (gastroesophageal reflux disease)    Headache    migraines   Hemorrhoids    History of hiatal hernia    Hypercalcemia    Hyperlipidemia    Hypertension    Hypothyroidism    Lumbar stenosis    Obesity    Peptic ulcer disease    Shortness of breath dyspnea    Sleep apnea    No CPAP   Vertigo    Vitamin D deficiency     Patient Active Problem List   Diagnosis Date Noted   Shoulder pain, right    Closed right hip fracture ([place]) 05/06/2020   Essential hypertension 05/06/2020   Hypothyroidism 05/06/2020   Lumbar spondylosis 06/01/2017   Lumbar degenerative disc disease 06/01/2017    History of lumbar fusion 06/01/2017   Age related osteoporosis 06/01/2017   Chronic pain syndrome 06/01/2017   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 Mauri Pole, [facility], Spinal Fusion   CARDIAC CATHETERIZATION N/A 04/03/2015   Procedure: Left Heart Cath and Coronary Angiography;  Surgeon: Teodoro Spray, MD;  Location: [place] [facility];  Service: Cardiovascular;  Laterality: N/A;   CATARACT EXTRACTION W/PHACO Left 04/22/2019   Procedure: CATARACT EXTRACTION PHACO AND INTRAOCULAR LENS PLACEMENT (IOC) LEFT  00:45.4  12.9%  6.03;  Surgeon: Eulogio Bear, MD;  Location: [place];  Service: Ophthalmology;  Laterality: Left;  Latex sleep apnea   CATARACT EXTRACTION W/PHACO Right 07/01/2019   Procedure: CATARACT EXTRACTION PHACO AND INTRAOCULAR LENS PLACEMENT (IOC) RIGHT 2.38  00:24.4;  Surgeon: Eulogio Bear, MD;  Location: [place];  Service: Ophthalmology;  Laterality: Right;  Latex Sleep apnea   CHOLECYSTECTOMY     CORONARY ANGIOPLASTY  2015   Dr. Ubaldo Glassing, [facility] Cath Lab   ESOPHAGOGASTRODUODENOSCOPY (EGD) WITH PROPOFOL N/A 12/21/2016   Procedure: ESOPHAGOGASTRODUODENOSCOPY (EGD) WITH PROPOFOL;  Surgeon: Manya Silvas, MD;  Location: [facility] ENDOSCOPY;  Service: Endoscopy;  Laterality: N/A;   FRACTURE SURGERY Left 1991   Fractures Femur,  [facility]   HIP ARTHROPLASTY Right 05/07/2020   Procedure: ARTHROPLASTY RIGHT HIP (HEMIARTHROPLASTY);  Surgeon: Thornton Park, MD;  Location: [facility];  Service: Orthopedics;  Laterality: Right;   IMAGE GUIDED SINUS SURGERY Bilateral 09/28/2015   Procedure: IMAGE GUIDED SINUS SURGERY, SEPTOPLASTY, BILATERAL FRONTAL SINUSOTOMIES, BILATERAL MAXILLARY ANTROSTOMIES, BILATERAL TOTAL ETHMOIDECTOMY, BILATERAL SPHENOIDECTOMY, BILATERAL INFERIOR TURBINATE REDUCTION;  Surgeon: Margaretha Sheffield, MD;  Location: [facility] ORS;  Service: ENT;  Laterality:  Bilateral;   RECTOCELE REPAIR      Prior to Admission medications   Medication Sig Start Date End Date Taking? Authorizing Provider  acetaminophen (TYLENOL) 500 MG tablet Take 500 mg by mouth every 6 (six) hours as needed.    [provider]  albuterol (PROVENTIL HFA;VENTOLIN HFA) 108 (90 BASE) MCG/ACT inhaler Inhale 2 puffs into the lungs.    [provider]  alum & mag hydroxide-simeth (MAALOX/MYLANTA) 200-200-20 MG/5ML suspension Take 30 mLs by mouth every 4 (four) hours as needed for indigestion. 05/14/20   Lorella Nimrod, MD  amLODipine (NORVASC) 10 MG tablet Take 10 mg by mouth daily. 05/05/20   [provider]  apixaban (ELIQUIS) 5 MG TABS tablet Take 2 tablets ('10mg'$ ) twice daily for 7 days, then 1 tablet ('5mg'$ ) twice daily 06/03/20 01/15/21  Vladimir Crofts, MD  aspirin 81 MG chewable tablet Chew by mouth daily.    [provider]  calcium carbonate (OS-CAL - DOSED IN MG OF ELEMENTAL CALCIUM) 1250 (500 Ca) MG tablet Take 1 tablet (500 mg of elemental calcium total) by mouth daily with breakfast. 05/14/20   Lorella Nimrod, MD  cetirizine (ZYRTEC) 10 MG tablet Take 1 tablet (10 mg total) by mouth daily. If needed at night for itching not relieved by Claritin in the morning. 05/14/20   Lorella Nimrod, MD  citalopram (CELEXA) 40 MG tablet Take 40 mg by mouth daily.    [provider]  clonazePAM (KLONOPIN) 1 MG tablet Take 1 mg by mouth 2 (two) times daily. As needed for anxiety    [provider]  diclofenac sodium (VOLTAREN) 1 % GEL Apply 2 g topically 4 (four) times daily.    [provider]  docusate sodium (COLACE) 100 MG capsule Take 1 capsule (100 mg total) by mouth 2 (two) times daily. 05/14/20   Lorella Nimrod, MD  Ensure Max Protein (ENSURE MAX PROTEIN) LIQD Take 330 mLs (11 oz total) by mouth daily. 05/14/20   Lorella Nimrod, MD  EPINEPHrine 0.3 mg/0.3 mL IJ SOAJ injection Inject 0.3 mg into the muscle once. 04/20/20   [provider]  ergocalciferol (VITAMIN D2) 50000 UNITS capsule Take 50,000 Units by mouth every 30 (thirty) days.    [provider]  fluticasone (VERAMYST) 27.5 MCG/SPRAY nasal spray Place 2 sprays into the nose daily. 05/14/20   Lorella Nimrod, MD  gabapentin (NEURONTIN) 600 MG tablet Take 600 mg by mouth 3 (three) times daily. 04/24/20   [provider]  hydrOXYzine (ATARAX/VISTARIL) 10 MG tablet Take 1 tablet (10 mg total) by mouth 3 (three) times daily as needed. 05/14/20   Lorella Nimrod, MD  Iron, Ferrous Sulfate, 325 (65 Fe) MG TABS Take by mouth 2 (two) times daily.    [provider]  levothyroxine (SYNTHROID) 137 MCG tablet Take 137 mcg by mouth daily. 04/21/20   [provider]  lidocaine (LIDODERM) 5 % Place 1 patch onto the skin daily. Remove & Discard patch within 12 hours  or as directed by MD 05/14/20   Lorella Nimrod, MD  metoprolol tartrate (LOPRESSOR) 25 MG tablet Take 37.5 mg by mouth 2 (two) times daily.     [provider]  mirtazapine (REMERON) 15 MG tablet Take 15 mg by mouth at bedtime. 04/07/20   [provider]  nitroGLYCERIN (NITROSTAT) 0.4 MG SL tablet Place 0.4 mg under the tongue every 5 (five) minutes as needed for chest pain.    [provider]  omeprazole (PRILOSEC) 20 MG capsule Take 40 mg by mouth daily.     [provider]  oxyCODONE (OXY IR/ROXICODONE) 5 MG immediate release tablet Take 1-2 tablets (5-10 mg total) by mouth every 4 (four) hours as needed for moderate pain (pain score 4-6). 05/14/20   Lorella Nimrod, MD  Polyethyl Glycol-Propyl Glycol (SYSTANE) 0.4-0.3 % GEL ophthalmic gel Place 1 application into both eyes 2 (two) times daily as needed (dry eyes).    [provider]  polyethylene glycol (MIRALAX / GLYCOLAX) 17 g packet Take 17 g by mouth daily. 05/14/20   Lorella Nimrod, MD  polyvinyl alcohol (LIQUIFILM TEARS) 1.4 % ophthalmic solution Place 1 drop into both eyes daily.  05/14/20   Lorella Nimrod, MD  simethicone (MYLICON) 80 MG chewable tablet Chew 1 tablet (80 mg total) by mouth 4 (four) times daily as needed for flatulence. 05/14/20   Lorella Nimrod, MD  tiZANidine (ZANAFLEX) 4 MG tablet Take 1 tablet (4 mg total) by mouth every 6 (six) hours as needed for muscle spasms. 05/14/20   Lorella Nimrod, MD  traMADol (ULTRAM) 50 MG tablet Take 1 tablet (50 mg total) by mouth every 6 (six) hours as needed. 08/08/20   Harvest Dark, MD  loratadine (CLARITIN) 10 MG tablet Take 1 tablet (10 mg total) by mouth daily. Take 1 tablet in the morning. As needed for itching. 02/14/17 02/17/19  Frederich Cha, MD  ranitidine (ZANTAC) 150 MG capsule Take 1 capsule (150 mg total) by mouth 2 (two) times daily. 02/14/17 02/17/19  Frederich Cha, MD    Allergies Zithromax [azithromycin], Adhesive [tape], Codeine, Lactose intolerance (gi), Morphine and related, Red dye, Shellfish-derived products, Latex, Lodine [etodolac], Penicillins, and Pravastatin  Family History  Problem Relation Age of Onset   Hypertension Mother    Cancer Mother    Diabetes Father    Hypertension Father    Heart failure Father    Breast cancer Neg Hx     Social History Social History   Tobacco Use   Smoking status: Former    Types: Cigarettes    Quit date: 2000    Years since quitting: 22.5   Smokeless tobacco: Never  Vaping Use   Vaping Use: Never used  Substance Use Topics   Alcohol use: No   Drug use: No    Review of Systems  Constitutional: No fever/chills. Positive for dizziness. Eyes: No visual changes. ENT: No sore throat. Cardiovascular: Denies chest pain. Positive for chest pressure. Respiratory: Denies shortness of breath. Gastrointestinal: No abdominal pain.  No nausea, no vomiting.  No diarrhea.  No constipation. Genitourinary: Negative for dysuria. Musculoskeletal: Negative for back pain. Skin: Negative for rash. Neurological: Negative for headaches, focal weakness or  numbness  ____________________________________________   PHYSICAL EXAM:  VITAL SIGNS: ED Triage Vitals  Enc Vitals Group     BP 01/25/21 1149 (!) 142/76     Pulse Rate 01/25/21 1149 (!) 58     Resp 01/25/21 1149 18     Temp 01/25/21 1149 98.2 F (  36.8 C)     Temp src --      SpO2 01/25/21 1149 96 %     Weight 01/25/21 1151 224 lb 13.9 oz (102 kg)     Height 01/25/21 1151 '5\' 3"'$  (1.6 m)     Head Circumference --      Peak Flow --      Pain Score 01/25/21 1151 0     Pain Loc --      Pain Edu? --      Excl. in [place]? --     Constitutional: Alert and oriented. Well appearing and in no acute distress. Eyes: Conjunctivae are normal.  Head: Atraumatic. Nose: No congestion/rhinnorhea. Mouth/Throat: Mucous membranes are moist.  Oropharynx non-erythematous. Neck: No stridor.   Hematological/Lymphatic/Immunilogical: No cervical lymphadenopathy. Cardiovascular: Normal rate, regular rhythm. Grossly normal heart sounds.  Good peripheral circulation. Respiratory: Normal respiratory effort.  No retractions. Lungs CTAB. Gastrointestinal: Soft and nontender. No distention. No abdominal bruits. Genitourinary:  Musculoskeletal: No lower extremity tenderness nor edema.  No joint effusions. Neurologic:  Normal speech and language. No gross focal neurologic deficits are appreciated. No gait instability. Skin:  Skin is warm, dry and intact. No rash noted. Psychiatric: Mood and affect are normal. Speech and behavior are normal.  ____________________________________________   LABS (all labs ordered are listed, but only abnormal results are displayed)  Labs Reviewed  BASIC METABOLIC PANEL - Abnormal; Notable for the following components:      Result Value   BUN 25 (*)    Calcium 8.3 (*)    All other components within normal limits  CBC - Abnormal; Notable for the following components:   WBC 11.0 (*)    Hemoglobin 10.6 (*)    HCT 33.4 (*)    RDW 17.3 (*)    All other components within  normal limits  BRAIN NATRIURETIC PEPTIDE - Abnormal; Notable for the following components:   B Natriuretic Peptide 121.4 (*)    All other components within normal limits  D-DIMER, QUANTITATIVE - Abnormal; Notable for the following components:   D-Dimer, Quant 0.65 (*)    All other components within normal limits  TROPONIN I (HIGH SENSITIVITY)  TROPONIN I (HIGH SENSITIVITY)   ____________________________________________  EKG  ED ECG REPORT I, Jveon Pound, FNP-BC personally viewed and interpreted this ECG.   Date: 01/25/2021  EKG Time: 1148  Rate: 59  Rhythm: sinus bradycardia  Axis: normal  Intervals:none  ST&T Change: no ST elevation  ____________________________________________  RADIOLOGY  ED MD interpretation:    CTA chest without acute concerns including PE.  I, Bernardino Dowell, personally viewed and evaluated these images (plain radiographs) as part of my medical decision making, as well as reviewing the written report by the radiologist.  Official radiology report(s): DG Chest 2 View  Result Date: 01/25/2021 CLINICAL DATA:  chest pain EXAM: CHEST - 2 VIEW COMPARISON:  February 17, 2019. FINDINGS: Low lung volumes. No consolidation. No visible pleural effusions or pneumothorax. Moderate hiatal hernia. Mildly enlarged cardiac silhouette, likely accentuated by low lung volumes and similar to prior. Partially imaged levocurvature of the lumbar spine with lumbar fusion hardware. IMPRESSION: 1. Low lung volumes without evidence of acute cardiopulmonary disease. 2. Moderate hiatal hernia. Electronically Signed   By: Margaretha Sheffield MD   On: 01/25/2021 12:32   CT Head Wo Contrast  Result Date: 01/25/2021 CLINICAL DATA:  Headache, new or worsening.  Anticoagulated. EXAM: CT HEAD WITHOUT CONTRAST TECHNIQUE: Contiguous axial images were obtained from the base of the  skull through the vertex without intravenous contrast. COMPARISON:  MRI 12/04/2012.  CT 09/13/2012. FINDINGS: Brain:  Age related atrophy, frontal lobe predominance. No sign of old or acute focal infarction, mass lesion, hemorrhage, hydrocephalus or extra-axial collection. Vascular: There is atherosclerotic calcification of the major vessels at the base of the brain. Skull: Negative Sinuses/Orbits: Clear. Previous functional endoscopic sinus surgery. Orbits negative. Other: None IMPRESSION: No acute finding.  Atrophy with frontal lobe predominance. Electronically Signed   By: Nelson Chimes M.D.   On: 01/25/2021 14:14   CT Angio Chest PE W and/or Wo Contrast  Result Date: 01/25/2021 CLINICAL DATA:  Pulmonary emboli suspected. Low intermediate probability. Positive D-dimer. Chest pressure for 2 days. History of DVT right leg. EXAM: CT ANGIOGRAPHY CHEST WITH CONTRAST TECHNIQUE: Multidetector CT imaging of the chest was performed using the standard protocol during bolus administration of intravenous contrast. Multiplanar CT image reconstructions and MIPs were obtained to evaluate the vascular anatomy. CONTRAST:  82m OMNIPAQUE IOHEXOL 350 MG/ML SOLN COMPARISON:  Chest radiography same day. FINDINGS: Cardiovascular: Heart size is mildly enlarged. Coronary artery calcification is present. Aortic atherosclerotic calcification is present. Pulmonary arterial opacification is good. There are no pulmonary emboli. Mediastinum/Nodes: Hiatal hernia.  No adenopathy. Lungs/Pleura: Mild atelectasis at the left base adjacent to the hernia. The lungs are otherwise clear. No edema, infiltrate, collapse or effusion. No mass or nodule. Upper Abdomen: Previous cholecystectomy. Hiatal hernia. Otherwise negative. Musculoskeletal: Ordinary thoracic degenerative changes. Review of the MIP images confirms the above findings. IMPRESSION: No pulmonary emboli or other acute chest pathology. Mild cardiomegaly. Coronary artery calcification. Aortic atherosclerosis. Hiatal hernia. Electronically Signed   By: MNelson ChimesM.D.   On: 01/25/2021 14:12     ____________________________________________   PROCEDURES  Procedure(s) performed (including Critical Care):  Procedures  ____________________________________________   INITIAL IMPRESSION / ASSESSMENT AND PLAN     80year old female presenting to the emergency department for treatment and evaluation of dizziness, chest pressure, right lower extremity pain.  Right lower extremity pain has been present since she was diagnosed with DVT in February of this year.  She is on Eliquis.  Patient states that her main concern is that the "blood clot may be traveling up to my lungs or brain."  Will review protocol labs and if renal function is okay, will get CTA to rule out PE.  DIFFERENTIAL DIAGNOSIS  PE, vertigo, anxiety  ED COURSE  Labs are overall reassuring.  She did have a mild elevation of her fibrin derivative count, however if adjusted for age would be a normal value.  Either way, CTA of the chest to rule out PE has been requested.  CT of the chest negative for acute findings per radiology.  Patient will be discharged home to follow-up with cardiology as well as her primary care provider.  She is to return to the emergency department for symptoms of change or worsen if she is unable to schedule appointment.  Case was discussed with Dr. JCharna Archer ED attending who has also assessed the patient and agrees with plan of care.    ___________________________________________   FINAL CLINICAL IMPRESSION(S) / ED DIAGNOSES  Final diagnoses:  Dizziness  Chest pressure     ED Discharge Orders     None        Stacy PIZZIMENTIwas evaluated in Emergency Department on 01/25/2021 for the symptoms described in the history of present illness. She was evaluated in the context of the global COVID-19 pandemic, which necessitated consideration that the patient  might be at risk for infection with the SARS-CoV-2 virus that causes COVID-19. Institutional protocols and algorithms that pertain to the  evaluation of patients at risk for COVID-19 are in a state of rapid change based on information released by regulatory bodies including the CDC and federal and state organizations. These policies and algorithms were followed during the patient's care in the ED.   Note:  This document was prepared using Dragon voice recognition software and may include unintentional dictation errors.    Victorino Dike, FNP 01/25/21 1611    Blake Divine, MD 01/26/21 [id number]

## 2021-01-25 NOTE — ED Notes (Signed)
This RN assisted pt to ambulate to toilet.  Pt tolerated well.

## 2021-04-13 ENCOUNTER — Other Ambulatory Visit: Payer: Self-pay | Admitting: Family Medicine

## 2021-04-13 DIAGNOSIS — Z1231 Encounter for screening mammogram for malignant neoplasm of breast: Secondary | ICD-10-CM

## 2021-04-13 DIAGNOSIS — Z13820 Encounter for screening for osteoporosis: Secondary | ICD-10-CM

## 2021-04-19 ENCOUNTER — Ambulatory Visit
Admission: RE | Admit: 2021-04-19 | Discharge: 2021-04-19 | Disposition: A | Discharge status: Home / Self Care | Payer: Medicare HMO | Source: Ambulatory Visit | Attending: Student | Admitting: Student

## 2021-04-19 ENCOUNTER — Ambulatory Visit
Admission: RE | Admit: 2021-04-19 | Discharge: 2021-04-19 | Disposition: A | Discharge status: Home / Self Care | Payer: Medicare HMO | Attending: Student | Admitting: Student

## 2021-04-19 ENCOUNTER — Other Ambulatory Visit: Payer: Self-pay

## 2021-04-19 ENCOUNTER — Other Ambulatory Visit: Payer: Self-pay | Admitting: Student

## 2021-04-19 DIAGNOSIS — J984 Other disorders of lung: Secondary | ICD-10-CM | POA: Insufficient documentation

## 2021-06-04 ENCOUNTER — Other Ambulatory Visit: Payer: Self-pay | Admitting: Nurse Practitioner

## 2021-06-04 DIAGNOSIS — R1013 Epigastric pain: Secondary | ICD-10-CM

## 2021-06-14 ENCOUNTER — Ambulatory Visit: Payer: Medicare HMO

## 2021-09-07 ENCOUNTER — Emergency Department
Admission: EM | Admit: 2021-09-07 | Discharge: 2021-09-07 | Disposition: A | Discharge status: Home / Self Care | Payer: Medicare HMO | Attending: Emergency Medicine | Admitting: Emergency Medicine

## 2021-09-07 ENCOUNTER — Other Ambulatory Visit: Payer: Self-pay

## 2021-09-07 DIAGNOSIS — I509 Heart failure, unspecified: Secondary | ICD-10-CM | POA: Insufficient documentation

## 2021-09-07 DIAGNOSIS — I2510 Atherosclerotic heart disease of native coronary artery without angina pectoris: Secondary | ICD-10-CM | POA: Diagnosis not present

## 2021-09-07 DIAGNOSIS — I110 Hypertensive heart disease with heart failure: Secondary | ICD-10-CM | POA: Diagnosis not present

## 2021-09-07 DIAGNOSIS — R339 Retention of urine, unspecified: Secondary | ICD-10-CM | POA: Diagnosis present

## 2021-09-07 DIAGNOSIS — R300 Dysuria: Secondary | ICD-10-CM | POA: Diagnosis not present

## 2021-09-07 HISTORY — DX: Heart failure, unspecified: I50.9

## 2021-09-07 LAB — URINALYSIS, ROUTINE W REFLEX MICROSCOPIC
Bilirubin Urine: NEGATIVE
Glucose, UA: NEGATIVE mg/dL
Hgb urine dipstick: NEGATIVE
Ketones, ur: NEGATIVE mg/dL
Leukocytes,Ua: NEGATIVE
Nitrite: NEGATIVE
Protein, ur: NEGATIVE mg/dL
Specific Gravity, Urine: 1.011 (ref 1.005–1.030)
pH: 5 (ref 5.0–8.0)

## 2021-09-07 LAB — CBC
HCT: 35.9 % — ABNORMAL LOW (ref 36.0–46.0)
Hemoglobin: 10.8 g/dL — ABNORMAL LOW (ref 12.0–15.0)
MCH: 26.3 pg (ref 26.0–34.0)
MCHC: 30.1 g/dL (ref 30.0–36.0)
MCV: 87.6 fL (ref 80.0–100.0)
Platelets: 307 10*3/uL (ref 150–400)
RBC: 4.1 MIL/uL (ref 3.87–5.11)
RDW: 16.5 % — ABNORMAL HIGH (ref 11.5–15.5)
WBC: 7.1 10*3/uL (ref 4.0–10.5)
nRBC: 0 % (ref 0.0–0.2)

## 2021-09-07 LAB — BASIC METABOLIC PANEL
Anion gap: 7 (ref 5–15)
BUN: 22 mg/dL (ref 8–23)
CO2: 26 mmol/L (ref 22–32)
Calcium: 8.6 mg/dL — ABNORMAL LOW (ref 8.9–10.3)
Chloride: 101 mmol/L (ref 98–111)
Creatinine, Ser: 0.97 mg/dL (ref 0.44–1.00)
GFR, Estimated: 59 mL/min — ABNORMAL LOW (ref >60)
Glucose, Bld: 107 mg/dL — ABNORMAL HIGH (ref 70–99)
Potassium: 3.8 mmol/L (ref 3.5–5.1)
Sodium: 134 mmol/L — ABNORMAL LOW (ref 135–145)

## 2021-09-07 LAB — BRAIN NATRIURETIC PEPTIDE: B Natriuretic Peptide: 143.6 pg/mL — ABNORMAL HIGH (ref 0.0–100.0)

## 2021-09-07 MED ORDER — NITROFURANTOIN MONOHYD MACRO 100 MG PO CAPS: 100.0000 mg | ORAL_CAPSULE | Freq: Two times a day (BID) | ORAL | 0 refills | Status: AC

## 2021-09-07 NOTE — ED Triage Notes (Signed)
Pt states she has only passed drops of urine today, states she has a little urge to urinate but nothing seems to come out, pt is in NAD on arrival ?

## 2021-09-07 NOTE — ED Provider Notes (Signed)
? ?[facility] ?Provider Note ? ? ? Event Date/Time  ? First MD Initiated Contact with Patient 09/07/21 1325   ?  (approximate) ? ? ?History  ? ?Urinary Retention ? ? ?HPI ? ?Stacy Allen is a 81 y.o. female with a history of CHF, CAD, hypertension and additional past medical history as detailed in the chart who presents with complaints of urinary retention.  Patient reports this morning she went to urinate and was only able to get out a few drops.  She denies pain.  Was urinating normally yesterday.  No new medications. ?  ? ? ?Physical Exam  ? ?Triage Vital Signs: ?ED Triage Vitals  ?Enc Vitals Group  ?   BP 09/07/21 1317 (!) 144/70  ?   Pulse Rate 09/07/21 1317 65  ?   Resp 09/07/21 1317 19  ?   Temp 09/07/21 1317 97.6 ?F (36.4 ?C)  ?   Temp Source 09/07/21 1317 Oral  ?   SpO2 09/07/21 1317 94 %  ?   Weight 09/07/21 1332 102 kg (224 lb 13.9 oz)  ?   Height 09/07/21 1332 1.6 m ('5\' 3"'$ )  ?   Head Circumference --   ?   Peak Flow --   ?   Pain Score 09/07/21 1311 0  ?   Pain Loc --   ?   Pain Edu? --   ?   Excl. in [place]? --   ? ? ?Most recent vital signs: ?Vitals:  ? 09/07/21 1317  ?BP: (!) 144/70  ?Pulse: 65  ?Resp: 19  ?Temp: 97.6 ?F (36.4 ?C)  ?SpO2: 94%  ? ? ? ?General: Awake, no distress.  ?CV:  Good peripheral perfusion.  ?Resp:  Normal effort.  ?Abd:  No distention.  ?Other:   ? ? ?ED Results / Procedures / Treatments  ? ?Labs ?(all labs ordered are listed, but only abnormal results are displayed) ?Labs Reviewed  ?URINALYSIS, ROUTINE W REFLEX MICROSCOPIC - Abnormal; Notable for the following components:  ?    Result Value  ? Color, Urine YELLOW (*)   ? APPearance CLEAR (*)   ? All other components within normal limits  ?BASIC METABOLIC PANEL - Abnormal; Notable for the following components:  ? Sodium 134 (*)   ? Glucose, Bld 107 (*)   ? Calcium 8.6 (*)   ? GFR, Estimated 59 (*)   ? All other components within normal limits  ?CBC - Abnormal; Notable for the following components:  ?  Hemoglobin 10.8 (*)   ? HCT 35.9 (*)   ? RDW 16.5 (*)   ? All other components within normal limits  ?BRAIN NATRIURETIC PEPTIDE - Abnormal; Notable for the following components:  ? B Natriuretic Peptide 143.6 (*)   ? All other components within normal limits  ?URINE CULTURE  ? ? ? ?EKG ? ? ? ? ?RADIOLOGY ? ?Chest x-ray viewed by me, no acute abnormality ? ? ?PROCEDURES: ? ?Critical Care performed:  ? ?Procedures ? ? ?MEDICATIONS ORDERED IN ED: ?Medications - No data to display ? ? ?IMPRESSION / MDM / ASSESSMENT AND PLAN / ED COURSE  ?I reviewed the triage vital signs and the nursing notes. ? ? ?Patient presents with urinary retention as detailed above.  Bladder scanner only shows about 20 mL of fluid.  CBC and chemistry profile are normal.  Normal creatinine.  Normal kidney function ? ?Question urinary tract infection, in and out catheterization performed urinalysis pending ? ?In and out catheter only  demonstrated about 50 cc of urine, patient did complain of burning with catheterization ? ?Urinalysis not with clear evidence of UTI however given burning and a sense of retention will start on Macrobid. ? ?Patient may be having third spacing of fluid given her history of CHF.  Discussed trial of antibiotics and if worsening discomfort swelling difficulty urinating she will return to the emergency department.  She agrees with this plan ? ? ? ? ?  ? ? ?FINAL CLINICAL IMPRESSION(S) / ED DIAGNOSES  ? ?Final diagnoses:  ?Urinary retention  ?Dysuria  ? ? ? ?Rx / DC Orders  ? ?ED Discharge Orders   ? ?      Ordered  ?  nitrofurantoin, macrocrystal-monohydrate, (MACROBID) 100 MG capsule  2 times daily       ? 09/07/21 1456  ? ?  ?  ? ?  ? ? ? ?Note:  This document was prepared using Dragon voice recognition software and may include unintentional dictation errors. ?  ?Lavonia Drafts, MD ?09/07/21 1457 ? ?

## 2021-09-09 LAB — URINE CULTURE: Culture: NO GROWTH

## 2021-09-24 ENCOUNTER — Encounter: Payer: Self-pay | Admitting: Emergency Medicine

## 2021-09-24 ENCOUNTER — Ambulatory Visit
Admission: EM | Admit: 2021-09-24 | Discharge: 2021-09-24 | Disposition: A | Discharge status: Home / Self Care | Payer: Medicare HMO | Attending: Emergency Medicine | Admitting: Emergency Medicine

## 2021-09-24 DIAGNOSIS — M5431 Sciatica, right side: Secondary | ICD-10-CM | POA: Diagnosis not present

## 2021-09-24 DIAGNOSIS — M7918 Myalgia, other site: Secondary | ICD-10-CM

## 2021-09-24 MED ORDER — METHYLPREDNISOLONE 4 MG PO TBPK: ORAL_TABLET | Freq: Every day | ORAL | 0 refills | Status: DC

## 2021-09-24 NOTE — ED Provider Notes (Signed)
HPI ? ?SUBJECTIVE: ? ?Stacy Allen is a 81 y.o. female who presents with sharp, electric, posterior right buttock pain starting yesterday after leaning over a chair.  She describes it as like bees stinging or a repeated electric shock.  It is starting to radiate down the back of her leg.  No back or hip pain, no direct trauma to the area, fall, change in her physical activity, rash, bruising, swelling, fevers.  She has never had symptoms like this before.  No antipyretic in the past 6 hours.  She tried ice pack, Tylenol, tramadol and Voltaren topical cream.  The Tylenol being still helps.  Symptoms are worse with movement. Patient has a past medical history of congestive heart failure, coronary artery disease status post stent on Eliquis, fibromyalgia, lumbar stenosis, bilateral trochanteric bursitis, hypertension, and is status post right hip replacement.  No history of chronic kidney disease.  She is currently on Neurontin for nerve pain that was started years ago.  She is seen by pain management at [facility] and goes to the [place] [facility] for orthopedic care. ? ?Past Medical History:  ?Diagnosis Date  ? Adenomatous polyps   ? Allergic state   ? Anemia   ? Anginal pain ([place])   ? Anxiety   ? Arthritis   ? osteoarthritis  ? Broken rib   ? left  ? Cancer [facility])   ? skin  ? CHF (congestive heart failure) ([place])   ? Chicken pox   ? Complication of anesthesia   ? respiratory distress after rectocele surgery  ? Coronary artery disease   ? Depression   ? Eczema   ? Fibromyalgia   ? GERD (gastroesophageal reflux disease)   ? Headache   ? migraines  ? Hemorrhoids   ? History of hiatal hernia   ? Hypercalcemia   ? Hyperlipidemia   ? Hypertension   ? Hypothyroidism   ? Lumbar stenosis   ? Obesity   ? Peptic ulcer disease   ? Shortness of breath dyspnea   ? Sleep apnea   ? No CPAP  ? Vertigo   ? Vitamin D deficiency   ? ? ?Past Surgical History:  ?Procedure Laterality Date  ? ABDOMINAL HYSTERECTOMY    ? BACK SURGERY  2007   ? Dr. Mauri Pole, [facility], Spinal Fusion  ? CARDIAC CATHETERIZATION N/A 04/03/2015  ? Procedure: Left Heart Cath and Coronary Angiography;  Surgeon: Teodoro Spray, MD;  Location: [place] CV LAB;  Service: Cardiovascular;  Laterality: N/A;  ? CATARACT EXTRACTION W/PHACO Left 04/22/2019  ? Procedure: CATARACT EXTRACTION PHACO AND INTRAOCULAR LENS PLACEMENT (IOC) LEFT  00:45.4  12.9%  6.03;  Surgeon: Eulogio Bear, MD;  Location: [place];  Service: Ophthalmology;  Laterality: Left;  Latex ?sleep apnea  ? CATARACT EXTRACTION W/PHACO Right 07/01/2019  ? Procedure: CATARACT EXTRACTION PHACO AND INTRAOCULAR LENS PLACEMENT (IOC) RIGHT 2.38  00:24.4;  Surgeon: Eulogio Bear, MD;  Location: [place];  Service: Ophthalmology;  Laterality: Right;  Latex ?Sleep apnea  ? CHOLECYSTECTOMY    ? CORONARY ANGIOPLASTY  2015  ? Dr. Ubaldo Glassing, [facility] Cath Lab  ? ESOPHAGOGASTRODUODENOSCOPY (EGD) WITH PROPOFOL N/A 12/21/2016  ? Procedure: ESOPHAGOGASTRODUODENOSCOPY (EGD) WITH PROPOFOL;  Surgeon: Manya Silvas, MD;  Location: [facility] ENDOSCOPY;  Service: Endoscopy;  Laterality: N/A;  ? FRACTURE SURGERY Left 1991  ? Fractures Femur, [facility]  ? HIP ARTHROPLASTY Right 05/07/2020  ? Procedure: ARTHROPLASTY RIGHT HIP (HEMIARTHROPLASTY);  Surgeon: Thornton Park, MD;  Location: [facility]  ORS;  Service: Orthopedics;  Laterality: Right;  ? IMAGE GUIDED SINUS SURGERY Bilateral 09/28/2015  ? Procedure: IMAGE GUIDED SINUS SURGERY, SEPTOPLASTY, BILATERAL FRONTAL SINUSOTOMIES, BILATERAL MAXILLARY ANTROSTOMIES, BILATERAL TOTAL ETHMOIDECTOMY, BILATERAL SPHENOIDECTOMY, BILATERAL INFERIOR TURBINATE REDUCTION;  Surgeon: Margaretha Sheffield, MD;  Location: [facility] ORS;  Service: ENT;  Laterality: Bilateral;  ? RECTOCELE REPAIR    ? ? ?Family History  ?Problem Relation Age of Onset  ? Hypertension Mother   ? Cancer Mother   ? Diabetes Father   ? Hypertension Father   ? Heart failure Father   ? Breast cancer Neg Hx   ? ? ?Social History   ? ?Tobacco Use  ? Smoking status: Former  ?  Types: Cigarettes  ?  Quit date: 2000  ?  Years since quitting: 23.2  ? Smokeless tobacco: Never  ?Vaping Use  ? Vaping Use: Never used  ?Substance Use Topics  ? Alcohol use: No  ? Drug use: No  ? ? ?No current facility-administered medications for this encounter. ? ?Current Outpatient Medications:  ?  acetaminophen (TYLENOL) 500 MG tablet, Take 500 mg by mouth every 6 (six) hours as needed., Disp: , Rfl:  ?  albuterol (PROVENTIL HFA;VENTOLIN HFA) 108 (90 BASE) MCG/ACT inhaler, Inhale 2 puffs into the lungs., Disp: , Rfl:  ?  alum & mag hydroxide-simeth (MAALOX/MYLANTA) 200-200-20 MG/5ML suspension, Take 30 mLs by mouth every 4 (four) hours as needed for indigestion., Disp: 355 mL, Rfl: 0 ?  amLODipine (NORVASC) 10 MG tablet, Take 10 mg by mouth daily., Disp: , Rfl:  ?  apixaban (ELIQUIS) 5 MG TABS tablet, Take 2 tablets ('10mg'$ ) twice daily for 7 days, then 1 tablet ('5mg'$ ) twice daily, Disp: 60 tablet, Rfl: 2 ?  aspirin 81 MG chewable tablet, Chew by mouth daily., Disp: , Rfl:  ?  calcium carbonate (OS-CAL - DOSED IN MG OF ELEMENTAL CALCIUM) 1250 (500 Ca) MG tablet, Take 1 tablet (500 mg of elemental calcium total) by mouth daily with breakfast., Disp: , Rfl:  ?  cetirizine (ZYRTEC) 10 MG tablet, Take 1 tablet (10 mg total) by mouth daily. If needed at night for itching not relieved by Claritin in the morning., Disp: 30 tablet, Rfl: 0 ?  citalopram (CELEXA) 40 MG tablet, Take 40 mg by mouth daily., Disp: , Rfl:  ?  clonazePAM (KLONOPIN) 1 MG tablet, Take 1 mg by mouth 2 (two) times daily. As needed for anxiety, Disp: , Rfl:  ?  diclofenac sodium (VOLTAREN) 1 % GEL, Apply 2 g topically 4 (four) times daily., Disp: , Rfl:  ?  docusate sodium (COLACE) 100 MG capsule, Take 1 capsule (100 mg total) by mouth 2 (two) times daily., Disp: 10 capsule, Rfl: 0 ?  Ensure Max Protein (ENSURE MAX PROTEIN) LIQD, Take 330 mLs (11 oz total) by mouth daily., Disp: , Rfl:  ?  EPINEPHrine 0.3  mg/0.3 mL IJ SOAJ injection, Inject 0.3 mg into the muscle once., Disp: , Rfl:  ?  ergocalciferol (VITAMIN D2) 50000 UNITS capsule, Take 50,000 Units by mouth every 30 (thirty) days., Disp: , Rfl:  ?  fluticasone (VERAMYST) 27.5 MCG/SPRAY nasal spray, Place 2 sprays into the nose daily., Disp: 10 g, Rfl:  ?  gabapentin (NEURONTIN) 600 MG tablet, Take 600 mg by mouth 3 (three) times daily., Disp: , Rfl:  ?  hydrOXYzine (ATARAX/VISTARIL) 10 MG tablet, Take 1 tablet (10 mg total) by mouth 3 (three) times daily as needed., Disp: 30 tablet, Rfl:  ?  Iron, Ferrous Sulfate,  325 (65 Fe) MG TABS, Take by mouth 2 (two) times daily., Disp: , Rfl:  ?  levothyroxine (SYNTHROID) 137 MCG tablet, Take 137 mcg by mouth daily., Disp: , Rfl:  ?  lidocaine (LIDODERM) 5 %, Place 1 patch onto the skin daily. Remove & Discard patch within 12 hours or as directed by MD, Disp: 30 patch, Rfl: 0 ?  metoprolol tartrate (LOPRESSOR) 25 MG tablet, Take 37.5 mg by mouth 2 (two) times daily. , Disp: , Rfl:  ?  mirtazapine (REMERON) 15 MG tablet, Take 15 mg by mouth at bedtime., Disp: , Rfl:  ?  nitroGLYCERIN (NITROSTAT) 0.4 MG SL tablet, Place 0.4 mg under the tongue every 5 (five) minutes as needed for chest pain., Disp: , Rfl:  ?  omeprazole (PRILOSEC) 20 MG capsule, Take 40 mg by mouth daily. , Disp: , Rfl:  ?  oxyCODONE (OXY IR/ROXICODONE) 5 MG immediate release tablet, Take 1-2 tablets (5-10 mg total) by mouth every 4 (four) hours as needed for moderate pain (pain score 4-6)., Disp: 30 tablet, Rfl: 0 ?  Polyethyl Glycol-Propyl Glycol (SYSTANE) 0.4-0.3 % GEL ophthalmic gel, Place 1 application into both eyes 2 (two) times daily as needed (dry eyes)., Disp: , Rfl:  ?  polyethylene glycol (MIRALAX / GLYCOLAX) 17 g packet, Take 17 g by mouth daily., Disp: 14 each, Rfl: 0 ?  polyvinyl alcohol (LIQUIFILM TEARS) 1.4 % ophthalmic solution, Place 1 drop into both eyes daily., Disp: 15 mL, Rfl: 0 ?  simethicone (MYLICON) 80 MG chewable tablet, Chew 1  tablet (80 mg total) by mouth 4 (four) times daily as needed for flatulence., Disp: 30 tablet, Rfl: 0 ?  tiZANidine (ZANAFLEX) 4 MG tablet, Take 1 tablet (4 mg total) by mouth every 6 (six) hours as needed for

## 2021-09-24 NOTE — Discharge Instructions (Addendum)
Finish the [place] with a healthcare provider tells you to stop.  You can try heat or ice, whichever feels better.  Please follow-up with your orthopedic surgeon ASAP.  Stop the steroids immediately if you notice a rash in the area of pain and seek medical attention. ?

## 2021-09-24 NOTE — ED Triage Notes (Signed)
Pt here with right lumbar back pain, pain in right buttock and shooting pain down right leg starting yesterday.  ?

## 2021-10-20 ENCOUNTER — Telehealth: Payer: Self-pay

## 2021-10-20 NOTE — Telephone Encounter (Signed)
Spoke with patient's niece [name] and she requested I call the patient directly to schedule. Attempted to contact patient to schedule a Palliative Care consult appointment. No answer left a message to return call.  ?

## 2021-10-21 ENCOUNTER — Other Ambulatory Visit (HOSPITAL_COMMUNITY): Payer: Self-pay

## 2021-10-21 ENCOUNTER — Observation Stay
Admission: EM | Admit: 2021-10-21 | Discharge: 2021-10-23 | Disposition: A | Discharge status: Home / Self Care | Payer: Medicare HMO | Attending: Internal Medicine | Admitting: Internal Medicine

## 2021-10-21 ENCOUNTER — Other Ambulatory Visit: Payer: Self-pay

## 2021-10-21 DIAGNOSIS — Z96641 Presence of right artificial hip joint: Secondary | ICD-10-CM | POA: Insufficient documentation

## 2021-10-21 DIAGNOSIS — Z91040 Latex allergy status: Secondary | ICD-10-CM | POA: Insufficient documentation

## 2021-10-21 DIAGNOSIS — I2510 Atherosclerotic heart disease of native coronary artery without angina pectoris: Secondary | ICD-10-CM | POA: Diagnosis not present

## 2021-10-21 DIAGNOSIS — E039 Hypothyroidism, unspecified: Secondary | ICD-10-CM | POA: Diagnosis not present

## 2021-10-21 DIAGNOSIS — Z79899 Other long term (current) drug therapy: Secondary | ICD-10-CM | POA: Diagnosis not present

## 2021-10-21 DIAGNOSIS — Z85828 Personal history of other malignant neoplasm of skin: Secondary | ICD-10-CM | POA: Insufficient documentation

## 2021-10-21 DIAGNOSIS — Z7901 Long term (current) use of anticoagulants: Secondary | ICD-10-CM | POA: Diagnosis not present

## 2021-10-21 DIAGNOSIS — E860 Dehydration: Secondary | ICD-10-CM | POA: Diagnosis not present

## 2021-10-21 DIAGNOSIS — A0472 Enterocolitis due to Clostridium difficile, not specified as recurrent: Secondary | ICD-10-CM | POA: Diagnosis not present

## 2021-10-21 DIAGNOSIS — Z87891 Personal history of nicotine dependence: Secondary | ICD-10-CM | POA: Diagnosis not present

## 2021-10-21 DIAGNOSIS — Z7982 Long term (current) use of aspirin: Secondary | ICD-10-CM | POA: Diagnosis not present

## 2021-10-21 DIAGNOSIS — R531 Weakness: Secondary | ICD-10-CM

## 2021-10-21 DIAGNOSIS — I110 Hypertensive heart disease with heart failure: Secondary | ICD-10-CM | POA: Diagnosis not present

## 2021-10-21 DIAGNOSIS — Z955 Presence of coronary angioplasty implant and graft: Secondary | ICD-10-CM | POA: Diagnosis not present

## 2021-10-21 DIAGNOSIS — I5032 Chronic diastolic (congestive) heart failure: Secondary | ICD-10-CM | POA: Diagnosis not present

## 2021-10-21 DIAGNOSIS — R197 Diarrhea, unspecified: Secondary | ICD-10-CM | POA: Diagnosis present

## 2021-10-21 HISTORY — DX: Enterocolitis due to Clostridium difficile, not specified as recurrent: A04.72

## 2021-10-21 LAB — LIPASE, BLOOD: Lipase: 24 U/L (ref 11–51)

## 2021-10-21 LAB — COMPREHENSIVE METABOLIC PANEL
ALT: 15 U/L (ref 0–44)
AST: 19 U/L (ref 15–41)
Albumin: 3.8 g/dL (ref 3.5–5.0)
Alkaline Phosphatase: 93 U/L (ref 38–126)
Anion gap: 8 (ref 5–15)
BUN: 15 mg/dL (ref 8–23)
CO2: 23 mmol/L (ref 22–32)
Calcium: 8.7 mg/dL — ABNORMAL LOW (ref 8.9–10.3)
Chloride: 103 mmol/L (ref 98–111)
Creatinine, Ser: 0.64 mg/dL (ref 0.44–1.00)
GFR, Estimated: >60 mL/min (ref >60)
Glucose, Bld: 113 mg/dL — ABNORMAL HIGH (ref 70–99)
Potassium: 3.7 mmol/L (ref 3.5–5.1)
Sodium: 134 mmol/L — ABNORMAL LOW (ref 135–145)
Total Bilirubin: 0.8 mg/dL (ref 0.3–1.2)
Total Protein: 7.7 g/dL (ref 6.5–8.1)

## 2021-10-21 LAB — CBC
HCT: 35.6 % — ABNORMAL LOW (ref 36.0–46.0)
Hemoglobin: 10.8 g/dL — ABNORMAL LOW (ref 12.0–15.0)
MCH: 26.1 pg (ref 26.0–34.0)
MCHC: 30.3 g/dL (ref 30.0–36.0)
MCV: 86 fL (ref 80.0–100.0)
Platelets: 328 10*3/uL (ref 150–400)
RBC: 4.14 MIL/uL (ref 3.87–5.11)
RDW: 16.4 % — ABNORMAL HIGH (ref 11.5–15.5)
WBC: 5.4 10*3/uL (ref 4.0–10.5)
nRBC: 0 % (ref 0.0–0.2)

## 2021-10-21 LAB — URINALYSIS, ROUTINE W REFLEX MICROSCOPIC
Bilirubin Urine: NEGATIVE
Glucose, UA: NEGATIVE mg/dL
Hgb urine dipstick: NEGATIVE
Ketones, ur: NEGATIVE mg/dL
Leukocytes,Ua: NEGATIVE
Nitrite: NEGATIVE
Protein, ur: NEGATIVE mg/dL
Specific Gravity, Urine: 1.019 (ref 1.005–1.030)
pH: 5 (ref 5.0–8.0)

## 2021-10-21 LAB — MAGNESIUM: Magnesium: 2.2 mg/dL (ref 1.7–2.4)

## 2021-10-21 LAB — PHOSPHORUS: Phosphorus: 2.8 mg/dL (ref 2.5–4.6)

## 2021-10-21 MED ORDER — CLONAZEPAM 0.5 MG PO TABS
1.0000 mg | ORAL_TABLET | Freq: Two times a day (BID) | ORAL | Status: DC
  Administered 2021-10-21 – 2021-10-23 (×4): 1 mg via ORAL
  Filled 2021-10-21 (×4): qty 2

## 2021-10-21 MED ORDER — POLYVINYL ALCOHOL 1.4 % OP SOLN
1.0000 [drp] | Freq: Two times a day (BID) | OPHTHALMIC | Status: DC | PRN
  Administered 2021-10-22: 1 [drp] via OPHTHALMIC
  Filled 2021-10-21: qty 15

## 2021-10-21 MED ORDER — ONDANSETRON HCL 4 MG/2ML IJ SOLN
4.0000 mg | Freq: Once | INTRAMUSCULAR | Status: AC
  Administered 2021-10-21: 4 mg via INTRAVENOUS
  Filled 2021-10-21: qty 2

## 2021-10-21 MED ORDER — GABAPENTIN 600 MG PO TABS
600.0000 mg | ORAL_TABLET | Freq: Every day | ORAL | Status: DC
Start: 2021-10-22 — End: 2021-10-23
  Administered 2021-10-22 – 2021-10-23 (×2): 600 mg via ORAL
  Filled 2021-10-21 (×2): qty 1

## 2021-10-21 MED ORDER — ACETAMINOPHEN 650 MG RE SUPP: 650.0000 mg | Freq: Four times a day (QID) | RECTAL | Status: DC | PRN

## 2021-10-21 MED ORDER — ALBUTEROL SULFATE (2.5 MG/3ML) 0.083% IN NEBU: 3.0000 mL | INHALATION_SOLUTION | Freq: Four times a day (QID) | RESPIRATORY_TRACT | Status: DC | PRN

## 2021-10-21 MED ORDER — EPINEPHRINE 0.3 MG/0.3ML IJ SOAJ: 0.3000 mg | Freq: Once | INTRAMUSCULAR | Status: DC

## 2021-10-21 MED ORDER — GABAPENTIN 600 MG PO TABS
1200.0000 mg | ORAL_TABLET | Freq: Every day | ORAL | Status: DC
  Administered 2021-10-21 – 2021-10-22 (×2): 1200 mg via ORAL
  Filled 2021-10-21 (×2): qty 2

## 2021-10-21 MED ORDER — ONDANSETRON HCL 4 MG/2ML IJ SOLN: 4.0000 mg | Freq: Four times a day (QID) | INTRAMUSCULAR | Status: DC | PRN

## 2021-10-21 MED ORDER — PANTOPRAZOLE SODIUM 40 MG PO TBEC
40.0000 mg | DELAYED_RELEASE_TABLET | Freq: Every day | ORAL | Status: DC
  Administered 2021-10-22 – 2021-10-23 (×2): 40 mg via ORAL
  Filled 2021-10-21 (×2): qty 1

## 2021-10-21 MED ORDER — AMLODIPINE BESYLATE 5 MG PO TABS
5.0000 mg | ORAL_TABLET | Freq: Every day | ORAL | Status: DC
  Administered 2021-10-22 – 2021-10-23 (×2): 5 mg via ORAL
  Filled 2021-10-21 (×2): qty 1

## 2021-10-21 MED ORDER — LEVOTHYROXINE SODIUM 50 MCG PO TABS
150.0000 ug | ORAL_TABLET | Freq: Every day | ORAL | Status: DC
  Administered 2021-10-22 – 2021-10-23 (×2): 150 ug via ORAL
  Filled 2021-10-21 (×2): qty 1

## 2021-10-21 MED ORDER — CITALOPRAM HYDROBROMIDE 20 MG PO TABS
40.0000 mg | ORAL_TABLET | Freq: Every day | ORAL | Status: DC
  Administered 2021-10-22 – 2021-10-23 (×2): 40 mg via ORAL
  Filled 2021-10-21 (×2): qty 2

## 2021-10-21 MED ORDER — ACETAMINOPHEN 325 MG PO TABS
650.0000 mg | ORAL_TABLET | Freq: Four times a day (QID) | ORAL | Status: DC | PRN
  Administered 2021-10-21 – 2021-10-22 (×2): 650 mg via ORAL
  Filled 2021-10-21: qty 2

## 2021-10-21 MED ORDER — LACTATED RINGERS IV BOLUS
1000.0000 mL | Freq: Once | INTRAVENOUS | Status: AC
  Administered 2021-10-21: 1000 mL via INTRAVENOUS

## 2021-10-21 MED ORDER — APIXABAN 5 MG PO TABS
5.0000 mg | ORAL_TABLET | Freq: Two times a day (BID) | ORAL | Status: DC
  Administered 2021-10-21 – 2021-10-23 (×4): 5 mg via ORAL
  Filled 2021-10-21 (×4): qty 1

## 2021-10-21 MED ORDER — METOPROLOL TARTRATE 25 MG PO TABS
25.0000 mg | ORAL_TABLET | Freq: Every day | ORAL | Status: DC
  Administered 2021-10-22 – 2021-10-23 (×2): 25 mg via ORAL
  Filled 2021-10-21 (×2): qty 1

## 2021-10-21 MED ORDER — FIDAXOMICIN 200 MG PO TABS
200.0000 mg | ORAL_TABLET | Freq: Two times a day (BID) | ORAL | Status: DC
  Administered 2021-10-21 – 2021-10-23 (×5): 200 mg via ORAL
  Filled 2021-10-21 (×5): qty 1

## 2021-10-21 MED ORDER — ONDANSETRON 4 MG PO TBDP
4.0000 mg | ORAL_TABLET | Freq: Once | ORAL | Status: DC | PRN
  Filled 2021-10-21: qty 1

## 2021-10-21 NOTE — Progress Notes (Signed)
Pt arrived to room 126 via bed from the ED. Received report from [place], Therapist, sports. VSS.  ?

## 2021-10-21 NOTE — H&P (Signed)
? ?History and Physical:  ? ? ?Stacy Allen  ? ?HGD:[id number] DOB: February 19, 1941 DOA: 10/21/2021 ? ?Referring MD/provider: Blake Divine, MD ?PCP: [name] Pouch, NP  ? ?Patient coming from: Home ? ?Chief Complaint: Diarrhea ? ?History of Present Illness:  ? ?Stacy Allen is a 81 y.o. female with multiple medical problems including but not limited to CAD, anxiety, osteoarthritis, skin cancer, chronic diastolic CHF, depression, fibromyalgia, hypertension, hyperlipidemia, lumbar stenosis, hypothyroidism, vitamin D deficiency, sleep apnea, obesity, peptic ulcer disease.  She went to see her physician 10/09/2021 because of diarrhea.  Stool specimen was obtained for testing and she was called with a positive C. difficile test on 10/12/2021.  She was initially prescribed Dificid but she said her co-pay was about $1,500 and she could not afford it.  Subsequently, she was given a prescription for oral vancomycin.  She had been on vancomycin for 6 days prior to admission but the diarrhea has persisted.  She has about 4-5 watery stools every day.  She also complains of nausea, inability to tolerate food, abdominal discomfort, fatigue and generalized weakness. ? ? ?ED Course:  The patient was given 1 L of Ringer's lactate, IV Zofran and started on Dificid in the ED.  Vital signs were stable. ? ?ROS:  ? ?ROS all other systems reviewed were negative ? ?Past Medical History:  ? ?Past Medical History:  ?Diagnosis Date  ? Adenomatous polyps   ? Allergic state   ? Anemia   ? Anginal pain ([place])   ? Anxiety   ? Arthritis   ? osteoarthritis  ? Broken rib   ? left  ? Cancer [facility])   ? skin  ? CHF (congestive heart failure) ([place])   ? Chicken pox   ? Complication of anesthesia   ? respiratory distress after rectocele surgery  ? Coronary artery disease   ? Depression   ? Eczema   ? Fibromyalgia   ? GERD (gastroesophageal reflux disease)   ? Headache   ? migraines  ? Hemorrhoids   ? History of hiatal hernia   ? Hypercalcemia   ?  Hyperlipidemia   ? Hypertension   ? Hypothyroidism   ? Lumbar stenosis   ? Obesity   ? Peptic ulcer disease   ? Shortness of breath dyspnea   ? Sleep apnea   ? No CPAP  ? Vertigo   ? Vitamin D deficiency   ? ? ?Past Surgical History:  ? ?Past Surgical History:  ?Procedure Laterality Date  ? ABDOMINAL HYSTERECTOMY    ? BACK SURGERY  2007  ? Dr. Mauri Pole, [facility], Spinal Fusion  ? CARDIAC CATHETERIZATION N/A 04/03/2015  ? Procedure: Left Heart Cath and Coronary Angiography;  Surgeon: Teodoro Spray, MD;  Location: [place] CV LAB;  Service: Cardiovascular;  Laterality: N/A;  ? CATARACT EXTRACTION W/PHACO Left 04/22/2019  ? Procedure: CATARACT EXTRACTION PHACO AND INTRAOCULAR LENS PLACEMENT (IOC) LEFT  00:45.4  12.9%  6.03;  Surgeon: Eulogio Bear, MD;  Location: [place];  Service: Ophthalmology;  Laterality: Left;  Latex ?sleep apnea  ? CATARACT EXTRACTION W/PHACO Right 07/01/2019  ? Procedure: CATARACT EXTRACTION PHACO AND INTRAOCULAR LENS PLACEMENT (IOC) RIGHT 2.38  00:24.4;  Surgeon: Eulogio Bear, MD;  Location: [place];  Service: Ophthalmology;  Laterality: Right;  Latex ?Sleep apnea  ? CHOLECYSTECTOMY    ? CORONARY ANGIOPLASTY  2015  ? Dr. Ubaldo Glassing, [facility] Cath Lab  ? ESOPHAGOGASTRODUODENOSCOPY (EGD) WITH PROPOFOL N/A 12/21/2016  ? Procedure: ESOPHAGOGASTRODUODENOSCOPY (EGD) WITH  PROPOFOL;  Surgeon: Manya Silvas, MD;  Location: [facility] ENDOSCOPY;  Service: Endoscopy;  Laterality: N/A;  ? FRACTURE SURGERY Left 1991  ? Fractures Femur, [facility]  ? HIP ARTHROPLASTY Right 05/07/2020  ? Procedure: ARTHROPLASTY RIGHT HIP (HEMIARTHROPLASTY);  Surgeon: Thornton Park, MD;  Location: [facility] ORS;  Service: Orthopedics;  Laterality: Right;  ? IMAGE GUIDED SINUS SURGERY Bilateral 09/28/2015  ? Procedure: IMAGE GUIDED SINUS SURGERY, SEPTOPLASTY, BILATERAL FRONTAL SINUSOTOMIES, BILATERAL MAXILLARY ANTROSTOMIES, BILATERAL TOTAL ETHMOIDECTOMY, BILATERAL SPHENOIDECTOMY, BILATERAL INFERIOR TURBINATE  REDUCTION;  Surgeon: Margaretha Sheffield, MD;  Location: [facility] ORS;  Service: ENT;  Laterality: Bilateral;  ? RECTOCELE REPAIR    ? ? ?Social History:  ? ?Social History  ? ?Socioeconomic History  ? Marital status: Widowed  ?  Spouse name: Not on file  ? Number of children: Not on file  ? Years of education: Not on file  ? Highest education level: Not on file  ?Occupational History  ? Not on file  ?Tobacco Use  ? Smoking status: Former  ?  Types: Cigarettes  ?  Quit date: 2000  ?  Years since quitting: 23.3  ? Smokeless tobacco: Never  ?Vaping Use  ? Vaping Use: Never used  ?Substance and Sexual Activity  ? Alcohol use: No  ? Drug use: No  ? Sexual activity: Not on file  ?Other Topics Concern  ? Not on file  ?Social History Narrative  ? Not on file  ? ?Social Determinants of Health  ? ?Financial Resource Strain: Not on file  ?Food Insecurity: Not on file  ?Transportation Needs: Not on file  ?Physical Activity: Not on file  ?Stress: Not on file  ?Social Connections: Not on file  ?Intimate Partner Violence: Not on file  ? ? ?Allergies  ? ?Meloxicam, Morphine, Zithromax [azithromycin], Adhesive [tape], Codeine, Lactose intolerance (gi), Morphine and related, Red dye, Shellfish-derived products, Latex, Lodine [etodolac], Penicillins, and Pravastatin ? ?Family history:  ? ?Family History  ?Problem Relation Age of Onset  ? Hypertension Mother   ? Cancer Mother   ? Diabetes Father   ? Hypertension Father   ? Heart failure Father   ? Breast cancer Neg Hx   ? ? ?Current Medications:  ? ?Prior to Admission medications   ?Medication Sig Start Date End Date Taking? Authorizing Provider  ?amLODipine (NORVASC) 10 MG tablet Take 10 mg by mouth daily. 05/05/20  Yes [provider]  ?gabapentin (NEURONTIN) 600 MG tablet Take 600 mg by mouth 3 (three) times daily. 04/24/20  Yes [provider]  ?levothyroxine (SYNTHROID) 137 MCG tablet Take 137 mcg by mouth daily. 04/21/20  Yes [provider]  ?metoprolol tartrate  (LOPRESSOR) 25 MG tablet Take 37.5 mg by mouth 2 (two) times daily.    Yes [provider]  ?acetaminophen (TYLENOL) 500 MG tablet Take 500 mg by mouth every 6 (six) hours as needed.    [provider]  ?albuterol (PROVENTIL HFA;VENTOLIN HFA) 108 (90 BASE) MCG/ACT inhaler Inhale 2 puffs into the lungs.    [provider]  ?alum & mag hydroxide-simeth (MAALOX/MYLANTA) 200-200-20 MG/5ML suspension Take 30 mLs by mouth every 4 (four) hours as needed for indigestion. ?Patient not taking: Reported on 10/21/2021 05/14/20   Lorella Nimrod, MD  ?apixaban (ELIQUIS) 5 MG TABS tablet Take 2 tablets ('10mg'$ ) twice daily for 7 days, then 1 tablet ('5mg'$ ) twice daily 06/03/20 01/15/21  Vladimir Crofts, MD  ?aspirin 81 MG chewable tablet Chew by mouth daily.    [provider]  ?  calcium carbonate (OS-CAL - DOSED IN MG OF ELEMENTAL CALCIUM) 1250 (500 Ca) MG tablet Take 1 tablet (500 mg of elemental calcium total) by mouth daily with breakfast. 05/14/20   Lorella Nimrod, MD  ?cetirizine (ZYRTEC) 10 MG tablet Take 1 tablet (10 mg total) by mouth daily. If needed at night for itching not relieved by Claritin in the morning. 05/14/20   Lorella Nimrod, MD  ?citalopram (CELEXA) 40 MG tablet Take 40 mg by mouth daily.    [provider]  ?clonazePAM (KLONOPIN) 1 MG tablet Take 1 mg by mouth 2 (two) times daily. As needed for anxiety    [provider]  ?diclofenac sodium (VOLTAREN) 1 % GEL Apply 2 g topically 4 (four) times daily.    [provider]  ?docusate sodium (COLACE) 100 MG capsule Take 1 capsule (100 mg total) by mouth 2 (two) times daily. 05/14/20   Lorella Nimrod, MD  ?Ensure Max Protein (ENSURE MAX PROTEIN) LIQD Take 330 mLs (11 oz total) by mouth daily. 05/14/20   Lorella Nimrod, MD  ?EPINEPHrine 0.3 mg/0.3 mL IJ SOAJ injection Inject 0.3 mg into the muscle once. 04/20/20   [provider]  ?ergocalciferol (VITAMIN D2) 50000 UNITS capsule Take 50,000 Units by mouth  every 30 (thirty) days.    [provider]  ?fluticasone (VERAMYST) 27.5 MCG/SPRAY nasal spray Place 2 sprays into the nose daily. ?Patient not taking: Reported on 10/21/2021 05/14/20   Lorella Nimrod, MD

## 2021-10-21 NOTE — TOC Benefit Eligibility Note (Signed)
Patient Advocate Encounter ? ?Patient is approved through the [employer] Patient Assistance Program for Dificid through 06/26/2022. ?  ?Will be mailed to patient's home. ? ? ?Lyndel Safe, CPhT ?Pharmacy Patient Advocate Specialist ?[place] Patient Advocate Team ?Direct Number: [phone]  Fax: [phone]  ?

## 2021-10-21 NOTE — ED Provider Notes (Signed)
? ?[facility] ?Provider Note ? ? ? Event Date/Time  ? First MD Initiated Contact with Patient 10/21/21 1406   ?  (approximate) ? ? ?History  ? ?Chief Complaint ?Diarrhea ? ? ?HPI ? ?Stacy Allen is a 81 y.o. female with PMHx of HTN, HLD, CAD, CHF, and chronic pain syndrome who presents to the ED complaining of diarrhea.  Patient reports that she has had about 2 weeks of profuse watery diarrhea without any bleeding. She was initially seen by her PCP for this problem, where she tested positive for C diff about 10 days ago. She was subsequently started on oral vancomycin and has been taking this consistently for the past 8 days. During that time, she states her symptoms have no improved at all and she continues to have watery diarrhea. She denies any associated fevers or abdominal pain, but reports nausea with frequent dry heaving. ? ?  ? ? ?Physical Exam  ? ?Triage Vital Signs: ?ED Triage Vitals  ?Enc Vitals Group  ?   BP 10/21/21 1220 (!) 145/82  ?   Pulse Rate 10/21/21 1220 68  ?   Resp 10/21/21 1220 20  ?   Temp 10/21/21 1220 97.9 ?F (36.6 ?C)  ?   Temp Source 10/21/21 1220 Oral  ?   SpO2 10/21/21 1220 94 %  ?   Weight 10/21/21 1222 270 lb (122.5 kg)  ?   Height 10/21/21 1222 '5\' 3"'$  (1.6 m)  ?   Head Circumference --   ?   Peak Flow --   ?   Pain Score 10/21/21 1222 5  ?   Pain Loc --   ?   Pain Edu? --   ?   Excl. in [place]? --   ? ? ?Most recent vital signs: ?Vitals:  ? 10/21/21 1220  ?BP: (!) 145/82  ?Pulse: 68  ?Resp: 20  ?Temp: 97.9 ?F (36.6 ?C)  ?SpO2: 94%  ? ? ?Constitutional: Alert and oriented. ?Eyes: Conjunctivae are normal. ?Head: Atraumatic. ?Nose: No congestion/rhinnorhea. ?Mouth/Throat: Mucous membranes are dry.  ?Cardiovascular: Normal rate, regular rhythm. Grossly normal heart sounds.  2+ radial pulses bilaterally. ?Respiratory: Normal respiratory effort.  No retractions. Lungs CTAB. ?Gastrointestinal: Soft and nontender. No distention. ?Musculoskeletal: No lower extremity  tenderness nor edema.  ?Neurologic:  Normal speech and language. No gross focal neurologic deficits are appreciated. ? ? ? ?ED Results / Procedures / Treatments  ? ?Labs ?(all labs ordered are listed, but only abnormal results are displayed) ?Labs Reviewed  ?COMPREHENSIVE METABOLIC PANEL - Abnormal; Notable for the following components:  ?    Result Value  ? Sodium 134 (*)   ? Glucose, Bld 113 (*)   ? Calcium 8.7 (*)   ? All other components within normal limits  ?CBC - Abnormal; Notable for the following components:  ? Hemoglobin 10.8 (*)   ? HCT 35.6 (*)   ? RDW 16.4 (*)   ? All other components within normal limits  ?URINALYSIS, ROUTINE W REFLEX MICROSCOPIC - Abnormal; Notable for the following components:  ? Color, Urine YELLOW (*)   ? APPearance HAZY (*)   ? All other components within normal limits  ?C DIFFICILE QUICK SCREEN W PCR REFLEX    ?GASTROINTESTINAL PANEL BY PCR, STOOL (REPLACES STOOL CULTURE)  ?LIPASE, BLOOD  ? ? ? ?EKG ? ?ED ECG REPORT ?Tempie Hoist, the attending physician, personally viewed and interpreted this ECG. ? ? Date: 10/21/2021 ? EKG Time: 12:29 ? Rate: 68 ?  Rhythm: normal sinus rhythm ? Axis: Normal ? Intervals:none ? ST&T Change: None ? ?PROCEDURES: ? ?Critical Care performed: No ? ?Procedures ? ? ?MEDICATIONS ORDERED IN ED: ?Medications  ?ondansetron (ZOFRAN-ODT) disintegrating tablet 4 mg (has no administration in time range)  ?fidaxomicin (DIFICID) tablet 200 mg (has no administration in time range)  ?lactated ringers bolus 1,000 mL (1,000 mLs Intravenous New Bag/Given 10/21/21 1453)  ?ondansetron (ZOFRAN) injection 4 mg (4 mg Intravenous Given 10/21/21 1451)  ? ? ? ?IMPRESSION / MDM / ASSESSMENT AND PLAN / ED COURSE  ?I reviewed the triage vital signs and the nursing notes. ?             ?               ? ?81 y.o. female with PMHx of HTN, hyperlipidemia, CAD, CHF, and chronic pain syndrome who presents to the ED complaining of ongoing profuse watery diarrhea after testing  positive for C diff 10 days ago and completing 8 days of oral vancomycin. ? ?Differential diagnosis includes, but is not limited to, recurrent c diff, dehydration, electrolyte abnormality, aki, toxic megacolon. ? ?Patient is well appearing and in no acute distress, vital signs are unremarkable and she has a benign abdominal exam but does appear dehydrated. Labs are reassuring with stable anemia, no leukocytosis, electrolyte abnormality or AKI. LFTs are within normal limits and no findings to suspect toxic megacolon at this time. Unfortunately, she has failed outpatient management with oral vancomycin and would benefit from inpatient treatment with dificid. We will hydrate with IVF and treat symptomatically with zofran. Case discussed with hospitalist for admission. ? ?  ? ? ?FINAL CLINICAL IMPRESSION(S) / ED DIAGNOSES  ? ?Final diagnoses:  ?C. difficile diarrhea  ?Dehydration  ? ? ? ?Rx / DC Orders  ? ?ED Discharge Orders   ? ? None  ? ?  ? ? ? ?Note:  This document was prepared using Dragon voice recognition software and may include unintentional dictation errors. ?  ?Blake Divine, MD ?10/21/21 1532 ? ?

## 2021-10-21 NOTE — ED Triage Notes (Signed)
Pt states she is diagnosed with c diff and has been on vancomycin po x 5 days. Last night pt experienced N/V/D since 2 am. Pt states she cannot keep food down and has diarrhea every time she eats.  ?

## 2021-10-21 NOTE — TOC Benefit Eligibility Note (Signed)
Patient Advocate Encounter ? ?Insurance verification completed.   ? ?The patient is currently admitted and upon discharge could be taking Dificid 200 mg tablets. ? ?The current 10 day co-pay is, $1,470.36.  ? ?The patient is insured through [employer] Part D  ? ? ? ?Lyndel Safe, CPhT ?Pharmacy Patient Advocate Specialist ?[place] Patient Advocate Team ?Direct Number: [phone]  Fax: [phone] ? ? ? ? ? ?  ?

## 2021-10-22 DIAGNOSIS — R531 Weakness: Secondary | ICD-10-CM | POA: Diagnosis not present

## 2021-10-22 DIAGNOSIS — A0472 Enterocolitis due to Clostridium difficile, not specified as recurrent: Secondary | ICD-10-CM | POA: Diagnosis not present

## 2021-10-22 DIAGNOSIS — E860 Dehydration: Secondary | ICD-10-CM | POA: Diagnosis not present

## 2021-10-22 LAB — MAGNESIUM: Magnesium: 2.3 mg/dL (ref 1.7–2.4)

## 2021-10-22 LAB — BASIC METABOLIC PANEL WITH GFR
Anion gap: 7 (ref 5–15)
BUN: 20 mg/dL (ref 8–23)
CO2: 25 mmol/L (ref 22–32)
Calcium: 8.5 mg/dL — ABNORMAL LOW (ref 8.9–10.3)
Chloride: 103 mmol/L (ref 98–111)
Creatinine, Ser: 0.86 mg/dL (ref 0.44–1.00)
GFR, Estimated: >60 mL/min (ref >60)
Glucose, Bld: 101 mg/dL — ABNORMAL HIGH (ref 70–99)
Potassium: 3.6 mmol/L (ref 3.5–5.1)
Sodium: 135 mmol/L (ref 135–145)

## 2021-10-22 LAB — PHOSPHORUS: Phosphorus: 3.9 mg/dL (ref 2.5–4.6)

## 2021-10-22 MED ORDER — ADULT MULTIVITAMIN W/MINERALS CH
1.0000 | ORAL_TABLET | Freq: Every day | ORAL | Status: DC
  Administered 2021-10-22 – 2021-10-23 (×2): 1 via ORAL
  Filled 2021-10-22 (×2): qty 1

## 2021-10-22 MED ORDER — LACTATED RINGERS IV SOLN: INTRAVENOUS | Status: AC

## 2021-10-22 MED ORDER — PROSOURCE PLUS PO LIQD
30.0000 mL | Freq: Three times a day (TID) | ORAL | Status: DC
  Administered 2021-10-22 – 2021-10-23 (×3): 30 mL via ORAL
  Filled 2021-10-22 (×5): qty 30

## 2021-10-22 NOTE — Progress Notes (Signed)
Initial Nutrition Assessment ? ?DOCUMENTATION CODES:  ? ?Obesity unspecified ? ?INTERVENTION:  ? ?-Liberalize diet to 2 gram sodium for wider variety of meal selections ?-MVI with minerals daily ?-30 ml Prosource Plus TID, each supplement provides 100 kcals and 15 grams protein ? ?NUTRITION DIAGNOSIS:  ? ?Inadequate oral intake related to altered GI function as evidenced by per patient/family report. ? ?GOAL:  ? ?Patient will meet greater than or equal to 90% of their needs ? ?MONITOR:  ? ?PO intake, Supplement acceptance, Labs, Weight trends, Skin, I & O's ? ?REASON FOR ASSESSMENT:  ? ?Malnutrition Screening Tool ?  ? ?ASSESSMENT:  ? ?Stacy Allen is a 81 y.o. female with multiple medical problems including but not limited to CAD, anxiety, osteoarthritis, skin cancer, chronic diastolic CHF, depression, fibromyalgia, hypertension, hyperlipidemia, lumbar stenosis, hypothyroidism, vitamin D deficiency, sleep apnea, obesity, peptic ulcer disease.  She went to see her physician 10/09/2021 because of diarrhea.  Stool specimen was obtained for testing and she was called with a positive C. difficile test on 10/12/2021.  She was initially prescribed Dificid but she said her co-pay was about $1,500 and she could not afford it.  Subsequently, she was given a prescription for oral vancomycin.  She had been on vancomycin for 6 days prior to admission but the diarrhea has persisted.  She has about 4-5 watery stools every day.  She also complains of nausea, inability to tolerate food, abdominal discomfort, fatigue and generalized weakness. ? ?Pt admitted with c-diff diarrhea.  ? ?Reviewed I/O's: +1.1 L x 24 hours ? ?Spoke with pt at bedside, who reports decreased oral intake and weight loss over the past 3 weeks secondary to diarrhea. Per pt, she tried to take bites and sips of food "but it ran right through me". She tried to eat some boiled chicken PTA, but did not tolerate this well.   ? ?Pt sitting up eating breakfast and time  of visit (sausage, English muffin, and coffee). Pt reports not distress and no diarrhea.  ? ?Pt shares she was drinking Ensure and Premier protein daily prior to acute illness, due to hip surgery within the past year. She stopped drinking supplements as they became difficult to find and was also afraid it would upset her GI tract. Reviewed supplements on formulary; pt would like to try Prosource.  ? ?Pt reports she has lost about 20 pounds over the past 3 weeks. Per pt, UBW is around 220#. Reviewed wt hx; not wt loss noted over the past 9 months.  ? ?Medications reviewed.  ? ?Labs reviewed.   ? ?NUTRITION - FOCUSED PHYSICAL EXAM: ? ?Flowsheet Row Most Recent Value  ?Orbital Region No depletion  ?Upper Arm Region Mild depletion  ?Thoracic and Lumbar Region No depletion  ?Buccal Region No depletion  ?Temple Region No depletion  ?Clavicle Bone Region No depletion  ?Clavicle and Acromion Bone Region No depletion  ?Scapular Bone Region No depletion  ?Dorsal Hand No depletion  ?Patellar Region No depletion  ?Anterior Thigh Region No depletion  ?Posterior Calf Region No depletion  ?Edema (RD Assessment) Mild  ?Hair Reviewed  ?Eyes Reviewed  ?Mouth Reviewed  ?Skin Reviewed  ?Nails Reviewed  ? ?  ? ? ?Diet Order:   ?Diet Order   ? ?       ?  Diet 2 gram sodium Room service appropriate? Yes; Fluid consistency: Thin  Diet effective now       ?  ? ?  ?  ? ?  ? ? ?  EDUCATION NEEDS:  ? ?Education needs have been addressed ? ?Skin:  Skin Assessment: Reviewed RN Assessment ? ?Last BM:  10/21/21 ? ?Height:  ? ?Ht Readings from Last 1 Encounters:  ?10/21/21 '5\' 3"'$  (1.6 m)  ? ? ?Weight:  ? ?Wt Readings from Last 1 Encounters:  ?10/21/21 119.2 kg  ? ? ?Ideal Body Weight:  52.3 kg ? ?BMI:  Body mass index is 46.55 kg/m?. ? ?Estimated Nutritional Needs:  ? ?Kcal:  1800-2000 ? ?Protein:  90-105 grams ? ?Fluid:  > 1.8 L ? ? ? ?Loistine Chance, RD, LDN, CDCES ?Registered Dietitian II ?Certified Diabetes Care and Education Specialist ?Please refer to  [facility] for RD and/or RD on-call/weekend/after hours pager  ?

## 2021-10-22 NOTE — Progress Notes (Signed)
[facility]) Hospital Liaison note: ? ?This is a pending outpatient-based Palliative Care patient. Will continue to follow for disposition. ? ?Please call with any outpatient palliative questions or concerns. ? ?Thank you, ?Lorelee Market, LPN ?[facility] Hospital Liaison ?[phone] ?

## 2021-10-22 NOTE — Plan of Care (Signed)
Initial Add ? ? ?Problem: Education: ?Goal: Knowledge of General Education information will improve ?Description: Including pain rating scale, medication(s)/side effects and non-pharmacologic comfort measures ?Outcome: Not Applicable ?  ?Problem: Health Behavior/Discharge Planning: ?Goal: Ability to manage health-related needs will improve ?Outcome: Not Applicable ?  ?Problem: Clinical Measurements: ?Goal: Ability to maintain clinical measurements within normal limits will improve ?Outcome: Not Applicable ?Goal: Will remain free from infection ?Outcome: Not Applicable ?Goal: Diagnostic test results will improve ?Outcome: Not Applicable ?Goal: Respiratory complications will improve ?Outcome: Not Applicable ?Goal: Cardiovascular complication will be avoided ?Outcome: Not Applicable ?  ?Problem: Activity: ?Goal: Risk for activity intolerance will decrease ?Outcome: Not Applicable ?  ?Problem: Nutrition: ?Goal: Adequate nutrition will be maintained ?Outcome: Not Applicable ?  ?Problem: Coping: ?Goal: Level of anxiety will decrease ?Outcome: Not Applicable ?  ?Problem: Elimination: ?Goal: Will not experience complications related to bowel motility ?Outcome: Not Applicable ?Goal: Will not experience complications related to urinary retention ?Outcome: Not Applicable ?  ?Problem: Pain Managment: ?Goal: General experience of comfort will improve ?Outcome: Not Applicable ?  ?Problem: Safety: ?Goal: Ability to remain free from injury will improve ?Outcome: Not Applicable ?  ?Problem: Skin Integrity: ?Goal: Risk for impaired skin integrity will decrease ?Outcome: Not Applicable ?  ?

## 2021-10-22 NOTE — Progress Notes (Signed)
? ? ? ?Progress Note  ? ? ?Stacy Allen  ZOX:[id number] DOB: [birth year]  DOA: 10/21/2021 ?PCP: [name] Pouch, NP  ? ? ? ? ?Brief Narrative:  ? ? ?Medical records reviewed and are as summarized below: ? ?Stacy SPROWL is a 81 y.o. female with multiple medical problems including but not limited to CAD, anxiety, osteoarthritis, skin cancer, chronic diastolic CHF, depression, fibromyalgia, hypertension, hyperlipidemia, lumbar stenosis, hypothyroidism, vitamin D deficiency, sleep apnea, obesity, peptic ulcer disease.  She went to see her physician 10/09/2021 because of diarrhea.  Stool specimen was obtained for testing and she was called with a positive C. difficile test on 10/12/2021.  She was initially prescribed Dificid but she said her co-pay was about $1,500 and she could not afford it.  Subsequently, she was given a prescription for oral vancomycin.  She had been on vancomycin for 6 days prior to admission but the diarrhea had persisted.  She had about 4-5 watery stools every day.  She also complained of nausea, inability to tolerate food, abdominal discomfort, fatigue and generalized weakness. ?  ? ? ? ? ? ? ? ?Assessment/Plan:  ? ?Principal Problem: ?  C. difficile diarrhea ?Active Problems: ?  Dehydration ?  Generalized weakness ? ? ? ?Body mass index is 46.55 kg/m?.  (Morbid obesity) ? ?C. difficile diarrhea: Continue fidaxomicin.  She had failed oral vancomycin as an outpatient.   ? ?Dehydration, generalized weakness and fatigue: Improving.  Continue IV fluids. ?  ?History of right lower extremity DVT: Continue Eliquis ? ?Chronic diastolic CHF: Compensated ? ?Other comorbidities include CAD s/p RCA stent, anxiety, depression,  ?arthritis ? ?Overall, she feels a little better but she does not feel back to her baseline.  She is apprehensive about going home today.  I was informed by Rachel Bo, pharmacist, that she has been approved for Dificid and it will be mailed to her house tomorrow. ? ?Diet Order   ? ?        ?  Diet 2 gram sodium Room service appropriate? Yes; Fluid consistency: Thin  Diet effective now       ?  ? ?  ?  ? ?  ? ? ? ? ? ? ? ? ?Consultants: ?None ? ?Procedures: ?None ? ? ? ?Medications:  ? ? (feeding supplement) PROSource Plus  30 mL Oral TID BM  ? amLODipine  5 mg Oral Daily  ? apixaban  5 mg Oral BID  ? citalopram  40 mg Oral Daily  ? clonazePAM  1 mg Oral BID  ? fidaxomicin  200 mg Oral BID  ? gabapentin  1,200 mg Oral QHS  ? gabapentin  600 mg Oral Daily  ? levothyroxine  150 mcg Oral Q0600  ? metoprolol tartrate  25 mg Oral Daily  ? multivitamin with minerals  1 tablet Oral Daily  ? pantoprazole  40 mg Oral Daily  ? ?Continuous Infusions: ? lactated ringers    ? ? ? ?Anti-infectives (From admission, onward)  ? ? Start     Dose/Rate Route Frequency Ordered Stop  ? 10/21/21 1515  fidaxomicin (DIFICID) tablet 200 mg       ? 200 mg Oral 2 times daily 10/21/21 1514 10/31/21 0959  ? ?  ? ? ? ? ? ? ? ? ? ?Family Communication/Anticipated D/C date and plan/Code Status  ? ?DVT prophylaxis:  ?apixaban (ELIQUIS) tablet 5 mg  ? ?  Code Status: DNR ? ?Family Communication: None ?Disposition Plan: Plan to discharge home tomorrow ? ? ?  Status is: Observation ?The patient will require care spanning > 2 midnights and should be moved to inpatient because: Generalized weakness, IV fluids ? ? ? ? ? ? ?Subjective:  ? ?Interval events noted.  She had 2 watery stools overnight.  No nausea or vomiting.  She feels weak and tired although she is a little better compared to yesterday. ? ?Objective:  ? ? ?Vitals:  ? 10/21/21 1842 10/21/21 2041 10/22/21 0501 10/22/21 0816  ?BP: 132/82 (!) 141/76 134/70 (!) 153/86  ?Pulse: 69 67 75 87  ?Resp: '17 16 19 17  '$ ?Temp: 97.6 ?F (36.4 ?C) (!) 97.5 ?F (36.4 ?C) 98.4 ?F (36.9 ?C) 97.9 ?F (36.6 ?C)  ?TempSrc: Oral Oral    ?SpO2: 96% 94% 96% 100%  ?Weight: 119.2 kg     ?Height: '5\' 3"'$  (1.6 m)     ? ?No data found. ? ? ?Intake/Output Summary (Last 24 hours) at 10/22/2021 1258 ?Last data filed at  10/22/2021 8676 ?Gross per 24 hour  ?Intake 1120 ml  ?Output --  ?Net 1120 ml  ? ?Filed Weights  ? 10/21/21 1222 10/21/21 1842  ?Weight: 122.5 kg 119.2 kg  ? ? ?Exam: ? ?GEN: NAD ?SKIN: No rash ?EYES: EOMI ?ENT: MMM ?CV: RRR ?PULM: CTA B ?ABD: soft, obese, NT, +BS ?CNS: AAO x 3, non focal ?EXT: No edema or tenderness ? ? ? ?  ? ? ?Data Reviewed:  ? ?I have personally reviewed following labs and imaging studies: ? ?Labs: ?Labs show the following:  ? ?Basic Metabolic Panel: ?Recent Labs  ?Lab 10/21/21 ?1227 10/21/21 ?1237 10/22/21 ?1950  ?NA 134*  --  135  ?K 3.7  --  3.6  ?CL 103  --  103  ?CO2 23  --  25  ?GLUCOSE 113*  --  101*  ?BUN 15  --  20  ?CREATININE 0.64  --  0.86  ?CALCIUM 8.7*  --  8.5*  ?MG  --  2.2 2.3  ?PHOS  --  2.8 3.9  ? ?GFR ?Estimated Creatinine Clearance: 66.2 mL/min (by C-G formula based on SCr of 0.86 mg/dL). ?Liver Function Tests: ?Recent Labs  ?Lab 10/21/21 ?1227  ?AST 19  ?ALT 15  ?ALKPHOS 93  ?BILITOT 0.8  ?PROT 7.7  ?ALBUMIN 3.8  ? ?Recent Labs  ?Lab 10/21/21 ?1227  ?LIPASE 24  ? ?No results for input(s): AMMONIA in the last 168 hours. ?Coagulation profile ?No results for input(s): INR, PROTIME in the last 168 hours. ? ?CBC: ?Recent Labs  ?Lab 10/21/21 ?1227  ?WBC 5.4  ?HGB 10.8*  ?HCT 35.6*  ?MCV 86.0  ?PLT 328  ? ?Cardiac Enzymes: ?No results for input(s): CKTOTAL, CKMB, CKMBINDEX, TROPONINI in the last 168 hours. ?BNP (last 3 results) ?No results for input(s): PROBNP in the last 8760 hours. ?CBG: ?No results for input(s): GLUCAP in the last 168 hours. ?D-Dimer: ?No results for input(s): DDIMER in the last 72 hours. ?Hgb A1c: ?No results for input(s): HGBA1C in the last 72 hours. ?Lipid Profile: ?No results for input(s): CHOL, HDL, LDLCALC, TRIG, CHOLHDL, LDLDIRECT in the last 72 hours. ?Thyroid function studies: ?No results for input(s): TSH, T4TOTAL, T3FREE, THYROIDAB in the last 72 hours. ? ?Invalid input(s): FREET3 ?Anemia work up: ?No results for input(s): VITAMINB12, FOLATE,  FERRITIN, TIBC, IRON, RETICCTPCT in the last 72 hours. ?Sepsis Labs: ?Recent Labs  ?Lab 10/21/21 ?1227  ?WBC 5.4  ? ? ?Microbiology ?No results found for this or any previous visit (from the past 240 hour(s)). ? ?Procedures and  diagnostic studies: ? ?No results found. ? ? ? ? ? ? ? ? ? ? ? ? LOS: 0 days  ? ?Hilja Kintzel  ?[facility]  ? ?Pager on [email or web address]. If 7PM-7AM, please contact night-coverage at [email or web address] ? ? ? ? ?10/22/2021, 12:58 PM  ? ? ? ? ? ? ? ? ? ?

## 2021-10-23 DIAGNOSIS — A0472 Enterocolitis due to Clostridium difficile, not specified as recurrent: Secondary | ICD-10-CM | POA: Diagnosis not present

## 2021-10-23 DIAGNOSIS — R531 Weakness: Secondary | ICD-10-CM | POA: Diagnosis not present

## 2021-10-23 DIAGNOSIS — E860 Dehydration: Secondary | ICD-10-CM | POA: Diagnosis not present

## 2021-10-23 MED ORDER — DIPHENHYDRAMINE HCL 25 MG PO CAPS
25.0000 mg | ORAL_CAPSULE | Freq: Four times a day (QID) | ORAL | Status: DC | PRN
Start: 2021-10-23 — End: 2021-10-23
  Administered 2021-10-23: 25 mg via ORAL
  Filled 2021-10-23: qty 1

## 2021-10-23 MED ORDER — AMLODIPINE BESYLATE 10 MG PO TABS: 5.0000 mg | ORAL_TABLET | Freq: Every day | ORAL | Status: DC

## 2021-10-23 MED ORDER — APIXABAN 5 MG PO TABS: 5.0000 mg | ORAL_TABLET | Freq: Two times a day (BID) | ORAL | 2 refills | Status: DC

## 2021-10-23 MED ORDER — DIFICID 200 MG PO TABS: 200.0000 mg | ORAL_TABLET | Freq: Two times a day (BID) | ORAL | 0 refills | Status: DC

## 2021-10-23 MED ORDER — METOPROLOL TARTRATE 25 MG PO TABS: 25.0000 mg | ORAL_TABLET | Freq: Every day | ORAL | Status: DC

## 2021-10-23 NOTE — Discharge Summary (Signed)
?Physician Discharge Summary ?  ?Patient: Stacy Allen MRN: [id number] DOB: 1941-06-09  ?Admit date:     10/21/2021  ?Discharge date: 10/23/2021  ?Discharge Physician: Jennye Boroughs  ? ?PCP: [name] Pouch, NP  ? ?Recommendations at discharge:  ? ? ?Follow-up with PCP in 1 week ? ?Discharge Diagnoses: ?Principal Problem: ?  C. difficile diarrhea ?Active Problems: ?  Dehydration ?  Generalized weakness ? ?Resolved Problems: ?  * No resolved hospital problems. * ? ?Hospital Course: ? ?Stacy Allen is a 81 y.o. female with multiple medical problems including but not limited to CAD, anxiety, osteoarthritis, skin cancer, chronic diastolic CHF, depression, fibromyalgia, hypertension, hyperlipidemia, lumbar stenosis, hypothyroidism, vitamin D deficiency, sleep apnea, obesity, peptic ulcer disease.  She went to see her physician 10/09/2021 because of diarrhea.  Stool specimen was obtained for testing and she was called with a positive C. difficile test on 10/12/2021.  She was initially prescribed Dificid but she said her co-pay was about $1,500 and she could not afford it.  Subsequently, she was given a prescription for oral vancomycin.  She had been on vancomycin for 6 days prior to admission but the diarrhea persisted.  She had about 4-5 watery stools every day. She also complained of nausea, inability to tolerate food, abdominal discomfort, fatigue and generalized weakness. ? ? ?She was admitted to the hospital for dehydration and generalized weakness from C. difficile diarrhea with failed outpatient vancomycin therapy.  She was treated with fidaxomicin and IV fluids.  Her condition improved significantly.  She is deemed stable for discharge to home.  Fidaxomicin will be mailed to her house by [employer].  ? ? ? ? ?  ? ? ?Consultants: None ?Procedures performed: None  ?Disposition: Home ?Diet recommendation:  ?Discharge Diet Orders (From admission, onward)  ? ?  Start     Ordered  ? 10/23/21 0000  Diet - low  sodium heart healthy       ? 10/23/21 1048  ? ?  ?  ? ?  ? ?Cardiac diet ?DISCHARGE MEDICATION: ?Allergies as of 10/23/2021   ? ?   Reactions  ? Meloxicam Diarrhea  ? Morphine Other (See Comments), Rash  ? Other reaction(s): Unknown ?Retain urine ?Other reaction(s): NOT KNOWN, Other (See Comments) ?Retain urine ?Other reaction(s): side effect of severe rash  ? Zithromax [azithromycin] Diarrhea  ? "fainting"  ? Adhesive [tape]   ? blisters  ? Codeine Nausea And Vomiting  ? Lactose Intolerance (gi) Other (See Comments)  ? "stomach pain"  ? Morphine And Related Hives  ? Red Dye Itching  ? "red food dye"  ? Shellfish-derived Products Itching, Swelling  ? Latex Rash  ? With extended contact  ? Lodine [etodolac] Rash  ? Penicillins Rash  ? Given on 05/07/20.  No reaction noted.    ? Pravastatin Rash  ? ?  ? ?  ?Medication List  ?  ? ?STOP taking these medications   ? ?alum & mag hydroxide-simeth 200-200-20 MG/5ML suspension ?Commonly known as: MAALOX/MYLANTA ?  ?aspirin 81 MG chewable tablet ?  ?calcium carbonate 1250 (500 Ca) MG tablet ?Commonly known as: OS-CAL - dosed in mg of elemental calcium ?  ?cetirizine 10 MG tablet ?Commonly known as: ZYRTEC ?  ?diclofenac sodium 1 % Gel ?Commonly known as: VOLTAREN ?  ?docusate sodium 100 MG capsule ?Commonly known as: COLACE ?  ?Ensure Max Protein Liqd ?  ?ergocalciferol 1.25 MG (50000 UT) capsule ?Commonly known as: VITAMIN D2 ?  ?fluticasone 27.5  MCG/SPRAY nasal spray ?Commonly known as: VERAMYST ?  ?hydrOXYzine 10 MG tablet ?Commonly known as: ATARAX ?  ?Iron (Ferrous Sulfate) 325 (65 Fe) MG Tabs ?  ?lidocaine 5 % ?Commonly known as: LIDODERM ?  ?methylPREDNISolone 4 MG Tbpk tablet ?Commonly known as: MEDROL DOSEPAK ?  ?mirtazapine 15 MG tablet ?Commonly known as: REMERON ?  ?nitroGLYCERIN 0.4 MG SL tablet ?Commonly known as: NITROSTAT ?  ?oxyCODONE 5 MG immediate release tablet ?Commonly known as: Oxy IR/ROXICODONE ?  ?polyethylene glycol 17 g packet ?Commonly known as:  MIRALAX / GLYCOLAX ?  ?polyvinyl alcohol 1.4 % ophthalmic solution ?Commonly known as: LIQUIFILM TEARS ?  ?simethicone 80 MG chewable tablet ?Commonly known as: MYLICON ?  ?tiZANidine 4 MG tablet ?Commonly known as: ZANAFLEX ?  ?traMADol 50 MG tablet ?Commonly known as: Ultram ?  ? ?  ? ?TAKE these medications   ? ?acetaminophen 500 MG tablet ?Commonly known as: TYLENOL ?Take 500 mg by mouth every 6 (six) hours as needed. ?  ?albuterol 108 (90 Base) MCG/ACT inhaler ?Commonly known as: VENTOLIN HFA ?Inhale 2 puffs into the lungs. ?  ?amLODipine 10 MG tablet ?Commonly known as: NORVASC ?Take 0.5 tablets (5 mg total) by mouth daily. ?What changed: how much to take ?  ?apixaban 5 MG Tabs tablet ?Commonly known as: Eliquis ?Take 1 tablet (5 mg total) by mouth 2 (two) times daily. Take 2 tablets ('10mg'$ ) twice daily for 7 days, then 1 tablet ('5mg'$ ) twice daily ?What changed:  ?how much to take ?how to take this ?when to take this ?  ?citalopram 40 MG tablet ?Commonly known as: CELEXA ?Take 40 mg by mouth daily. ?  ?clonazePAM 1 MG tablet ?Commonly known as: KLONOPIN ?Take 1 mg by mouth 2 (two) times daily. As needed for anxiety ?  ?Dificid 200 MG Tabs tablet ?Generic drug: fidaxomicin ?Take 1 tablet (200 mg total) by mouth 2 (two) times daily. ?  ?EPINEPHrine 0.3 mg/0.3 mL Soaj injection ?Commonly known as: EPI-PEN ?Inject 0.3 mg into the muscle once. ?  ?gabapentin 600 MG tablet ?Commonly known as: NEURONTIN ?Take 600 mg by mouth 3 (three) times daily. ?  ?levothyroxine 150 MCG tablet ?Commonly known as: SYNTHROID ?Take 150 mcg by mouth daily. ?What changed: Another medication with the same name was removed. Continue taking this medication, and follow the directions you see here. ?  ?metoprolol tartrate 25 MG tablet ?Commonly known as: LOPRESSOR ?Take 1 tablet (25 mg total) by mouth daily. ?What changed:  ?how much to take ?when to take this ?  ?omeprazole 20 MG capsule ?Commonly known as: PRILOSEC ?Take 40 mg by mouth  daily. ?  ?Systane 0.4-0.3 % Gel ophthalmic gel ?Generic drug: Polyethyl Glycol-Propyl Glycol ?Place 1 application into both eyes 2 (two) times daily as needed (dry eyes). ?  ? ?  ? ? ?Discharge Exam: ?Filed Weights  ? 10/21/21 1222 10/21/21 1842  ?Weight: 122.5 kg 119.2 kg  ? ?GEN: NAD ?SKIN: No rash ?EYES: EOMI ?ENT: MMM ?CV: RRR ?PULM: CTA B ?ABD: soft, obese, NT, +BS ?CNS: AAO x 3, non focal ?EXT: No edema or tenderness ? ? ?Condition at discharge: good ? ?The results of significant diagnostics from this hospitalization (including imaging, microbiology, ancillary and laboratory) are listed below for reference.  ? ?Imaging Studies: ?No results found. ? ?Microbiology: ?Results for orders placed or performed during the hospital encounter of 09/07/21  ?Urine Culture     Status: None  ? Collection Time: 09/07/21  2:24 PM  ?  Specimen: Urine, Clean Catch  ?Result Value Ref Range Status  ? Specimen Description   Final  ?  URINE, CLEAN CATCH ?Performed at [facility], [address] [place], [place] [address] ?  ? Special Requests   Final  ?  NONE ?Performed at [facility], [address], [place], [place] [address] ?  ? Culture   Final  ?  NO GROWTH ?Performed at [facility], [place] [address] [place], [place] [address] ?  ? Report Status 09/09/2021 FINAL  Final  ? ? ?Labs: ?CBC: ?Recent Labs  ?Lab 10/21/21 ?1227  ?WBC 5.4  ?HGB 10.8*  ?HCT 35.6*  ?MCV 86.0  ?PLT 328  ? ?Basic Metabolic Panel: ?Recent Labs  ?Lab 10/21/21 ?1227 10/21/21 ?1237 10/22/21 ?8412  ?NA 134*  --  135  ?K 3.7  --  3.6  ?CL 103  --  103  ?CO2 23  --  25  ?GLUCOSE 113*  --  101*  ?BUN 15  --  20  ?CREATININE 0.64  --  0.86  ?CALCIUM 8.7*  --  8.5*  ?MG  --  2.2 2.3  ?PHOS  --  2.8 3.9  ? ?Liver Function Tests: ?Recent Labs  ?Lab 10/21/21 ?1227  ?AST 19  ?ALT 15  ?ALKPHOS 93  ?BILITOT 0.8  ?PROT 7.7  ?ALBUMIN 3.8  ? ?CBG: ?No results for input(s): GLUCAP in the last 168 hours. ? ?Discharge time spent: greater than 30  minutes. ? ?Signed: ?Jennye Boroughs, MD ?[facility] ?10/23/2021 ?

## 2021-10-25 ENCOUNTER — Telehealth: Payer: Self-pay

## 2021-10-25 NOTE — Telephone Encounter (Signed)
Spoke with patient and scheduled a telephonic Palliative Consult for 10/28/21 @ 1 PM. ? ?Consent obtained; updated Netsmart, Team List and Epic.  ? ?

## 2021-10-28 ENCOUNTER — Other Ambulatory Visit: Payer: Medicare HMO | Admitting: Hospice

## 2021-10-28 DIAGNOSIS — A0472 Enterocolitis due to Clostridium difficile, not specified as recurrent: Secondary | ICD-10-CM

## 2021-10-28 DIAGNOSIS — Z515 Encounter for palliative care: Secondary | ICD-10-CM

## 2021-10-28 DIAGNOSIS — G894 Chronic pain syndrome: Secondary | ICD-10-CM

## 2021-10-28 DIAGNOSIS — G8929 Other chronic pain: Secondary | ICD-10-CM

## 2021-10-28 NOTE — Progress Notes (Signed)
? ? ?Manufacturing engineer ?Community Palliative Care Consult Note ?Telephone: [phone]  ?Fax: [phone] ? ?PATIENT NAME: Stacy Allen ?[place] [address] ?[phone] (home)  ?DOB: 03/28/1941 ?MRN: [id number] ? ?PRIMARY CARE PROVIDER:    ?Stacy Allen Pouch, NP,  ?[place] ?[place] [address] ?[phone] ? ?REFERRING PROVIDER:   ?Stacy Allen Pouch, NP ?[place],  [place] [address] ?[phone] ? ?RESPONSIBLE PARTY:   Self ?HCPOA: Stacy Allen ?Contact Information   ? ? Name Relation Home Work Mobile  ? Stacy Allen,Stacy Allen Niece [phone]  [phone]  ? Stacy Allen   [phone]  ? ?  ? ? ?TELEHEALTH VISIT STATEMENT ?Due to the COVID-19 crisis, this visit was done via telemedicine from my office and it was initiated and consent by this patient and or family.  ?I connected with patient OR PROXY by a telephone/video  and verified that I am speaking with the correct person. I discussed the limitations of evaluation and management by telemedicine. The patient expressed understanding and agreed to proceed. ?Palliative Care was asked to follow this patient to address advance care planning, complex medical decision making and goals of care clarification.  Stacy Allen is with patient during visit.  This is the initial visit.  ? ?  ASSESSMENT AND / RECOMMENDATIONS:  ? ?Advance Care Planning: Our advance care planning conversation included a discussion about:    ?The value and importance of advance care planning  ?Difference between Hospice and Palliative care ?Exploration of goals of care in the event of a sudden injury or illness  ?Identification and preparation of a healthcare agent  ?Review and updating or creation of an  advance directive document . ?Decision not to resuscitate or to de-escalate disease focused treatments due to poor prognosis. ? ?CODE STATUS: Patient affirmed she is a Do Not Resuscitate.  ? ?Goals of Care: Goals include to maximize  quality of life and symptom management ? ?I spent  16 minutes providing this initial consultation. More than 50% of the time in this consultation was spent on counseling patient and coordinating communication. ?-------------------------------------------------------------------------------------------------------------------------------------- ? ?Symptom Management/Plan: ?C-Diff: Patient continues on Fidaxomicin. Education on need to take as prescribed and to completion. No diarrhea today; improving.  Practice good hand hygiene and clean surfaces.  ?Chronic pain syndrome: related to hx of hip fracture, low back pain. Pain is managed with Tramadol.  Not taking today, last taken was last night-effective.  Routine CBC CMP ?Left knee osteoarthritis: followed by Ortho; cortisone shots every 3 months. Follow up with Ortho as planned.  ?Follow up: Palliative care will continue to follow for complex medical decision making, advance care planning, and clarification of goals. Return 6 weeks or prn. Encouraged to call provider sooner with any concerns.  ? ?Family /Caregiver/Community Supports: Patient lives next to her niece Stacy Allen.  Strong family support system identified. ? ?HOSPICE ELIGIBILITY/DIAGNOSIS: TBD ? ?Chief Complaint: Initial Palliative care visit ? ?HISTORY OF PRESENT ILLNESS:  Stacy Allen is a 81 y.o. year old female  with multiple morbidities requiring close monitoring and with high risk of complications and  mortality: CAD, anxiety, osteoarthritis, skin cancer, chronic diastolic CHF, depression, fibromyalgia, hypertension, hyperlipidemia, lumbar stenosis, hypothyroidism, vitamin D deficiency, sleep apnea, obesity, peptic ulcer disease.  Recent C. difficile for which patient was hospitalized last week, responding well to antibiotics.  Patient denies pain/discomfort, no diarrhea today. ? ?History obtained from review of EMR, discussion with primary team, caregiver, family and/or Stacy Allen.  ?Review and  summarization of  Epic records shows history from other than patient. Rest of 10 point ROS asked and negative.  ?Independent interpretation of tests and reviewed as needed, available labs, patient records, imaging, studies and related documents from the EMR. ? ?No results for input(s): WBC, HGB, HCT, PLT, MCV in the last 168 hours. ?Recent Labs  ?Lab 10/22/21 ?4401  ?NA 135  ?K 3.6  ?CL 103  ?CO2 25  ?BUN 20  ?CREATININE 0.86  ?GLUCOSE 101*  ? ? ?ROS ?General: NAD ?EYES: denies vision changes ?ENMT: denies dysphagia ?Cardiovascular: denies chest pain/discomfort ?Pulmonary: denies cough, denies SOB ?Abdomen: endorses fair to good appetite, denies constipation/diarrhea ?GU: denies dysuria, urinary frequency ?MSK:  endorses weakness,  no falls reported ?Skin: denies rashes or wounds ?Neurological: denies pain, denies insomnia ?Psych: Endorses positive mood ?Heme/lymph/immuno: denies bruises, abnormal bleeding ? ? ?PAST MEDICAL HISTORY:  ?Active Ambulatory Problems  ?  Diagnosis Date Noted  ? Lumbar spondylosis 06/01/2017  ? Lumbar degenerative disc disease 06/01/2017  ? History of lumbar fusion 06/01/2017  ? Age related osteoporosis 06/01/2017  ? Chronic pain syndrome 06/01/2017  ? 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 Closed right hip fracture ([facility]) 05/06/2020  ? Essential hypertension 05/06/2020  ? Hypothyroidism 05/06/2020  ? Shoulder pain, right   ? C. difficile diarrhea 10/21/2021  ? Dehydration 10/21/2021  ? Generalized weakness 10/21/2021  ? ?Resolved Ambulatory Problems  ?  Diagnosis Date Noted  ? No Resolved Ambulatory Problems  ? ?Past Medical History:  ?Diagnosis Date  ? Adenomatous polyps   ? Allergic state   ? Anemia   ? Anginal pain ([place])   ? Anxiety   ? Arthritis   ? Broken rib   ? Cancer [facility])   ? CHF (congestive heart failure) ([place])   ? Chicken pox   ? Complication of anesthesia   ? Coronary artery disease   ? Depression   ? Eczema   ? Fibromyalgia   ? GERD (gastroesophageal reflux disease)   ? Headache   ?  Hemorrhoids   ? History of hiatal hernia   ? Hypercalcemia   ? Hyperlipidemia   ? Hypertension   ? Lumbar stenosis   ? Obesity   ? Peptic ulcer disease   ? Shortness of breath dyspnea   ? Sleep apnea   ? Vertigo   ? Vitamin D deficiency   ? ? ?SOCIAL HX:  ?Social History  ? ?Tobacco Use  ? Smoking status: Former  ?  Types: Cigarettes  ?  Quit date: 2000  ?  Years since quitting: 23.3  ? Smokeless tobacco: Never  ?Substance Use Topics  ? Alcohol use: No  ? ?  ?FAMILY HX:  ?Family History  ?Problem Relation Age of Onset  ? Hypertension Mother   ? Cancer Mother   ? Diabetes Father   ? Hypertension Father   ? Heart failure Father   ? Breast cancer Neg Hx   ?   ? ?ALLERGIES:  ?Allergies  ?Allergen Reactions  ? Meloxicam Diarrhea  ? Morphine Other (See Comments) and Rash  ?  Other reaction(s): Unknown ?Retain urine ?Other reaction(s): NOT KNOWN, Other (See Comments) ?Retain urine ?Other reaction(s): side effect of severe rash  ? Zithromax [Azithromycin] Diarrhea  ?  "fainting"  ? Adhesive [Tape]   ?  blisters  ? Codeine Nausea And Vomiting  ? Lactose Intolerance (Gi) Other (See Comments)  ?  "stomach pain"  ? Morphine And Related Hives  ? Red Dye Itching  ?  "red food  dye"  ? Shellfish-Derived Products Itching and Swelling  ? Latex Rash  ?  With extended contact  ? Lodine [Etodolac] Rash  ? Penicillins Rash  ?  Given on 05/07/20.  No reaction noted.    ? Pravastatin Rash  ?   ? ?PERTINENT MEDICATIONS:  ?Outpatient Encounter Medications as of 10/28/2021  ?Medication Sig  ? acetaminophen (TYLENOL) 500 MG tablet Take 500 mg by mouth every 6 (six) hours as needed.  ? albuterol (PROVENTIL HFA;VENTOLIN HFA) 108 (90 BASE) MCG/ACT inhaler Inhale 2 puffs into the lungs.  ? amLODipine (NORVASC) 10 MG tablet Take 0.5 tablets (5 mg total) by mouth daily.  ? apixaban (ELIQUIS) 5 MG TABS tablet Take 1 tablet (5 mg total) by mouth 2 (two) times daily. Take 2 tablets ('10mg'$ ) twice daily for 7 days, then 1 tablet ('5mg'$ ) twice daily  ?  citalopram (CELEXA) 40 MG tablet Take 40 mg by mouth daily.  ? clonazePAM (KLONOPIN) 1 MG tablet Take 1 mg by mouth 2 (two) times daily. As needed for anxiety  ? DIFICID 200 MG TABS tablet Take 1 tablet (200 mg t

## 2021-11-11 ENCOUNTER — Other Ambulatory Visit: Payer: Medicare HMO | Admitting: Hospice

## 2021-11-11 DIAGNOSIS — A0472 Enterocolitis due to Clostridium difficile, not specified as recurrent: Secondary | ICD-10-CM

## 2021-11-11 DIAGNOSIS — N390 Urinary tract infection, site not specified: Secondary | ICD-10-CM

## 2021-11-11 DIAGNOSIS — G8929 Other chronic pain: Secondary | ICD-10-CM

## 2021-11-11 DIAGNOSIS — Z515 Encounter for palliative care: Secondary | ICD-10-CM

## 2021-11-11 DIAGNOSIS — G894 Chronic pain syndrome: Secondary | ICD-10-CM

## 2021-11-11 NOTE — Progress Notes (Signed)
Designer, [employer]ellery Palliative Care Consult Note Telephone: [phone]  Fax: [phone]  PATIENT NAME: Stacy Allen [address] [place] [address] [phone] (home)  DOB: [birth year] MRN: [id number]  PRIMARY CARE PROVIDER:    [name] Pouch, NP,  [place] [address] [phone]  REFERRING PROVIDER:   [name] Pouch, NP [address] [place],  [place] [address] [phone]  RESPONSIBLE PARTY:   Self HCPOA: Stacy Allen     Name Relation Home Work Mobile   [place] Stacy Allen Niece [phone]  [phone]   Stacy Allen   [phone]       TELEHEALTH VISIT STATEMENT Due to the COVID-19 crisis, this visit was done via telemedicine from my office and it was initiated and consent by this patient and or family.  I connected with patient OR PROXY by a telephone/video  and verified that I am speaking with the correct person. I discussed the limitations of evaluation and management by telemedicine. The patient expressed understanding and agreed to proceed. Palliative Care was asked to follow this patient to address advance care planning, complex medical decision making and goals of care clarification.      ASSESSMENT AND / RECOMMENDATIONS:   CODE STATUS: Patient affirmed she is a Do Not Resuscitate.   Goals of Care: Goals include to maximize quality of life and symptom management  Symptom Management/Plan: C-Diff: Completed Fidaxomicin. C-diff resolved. Continue to practice good hand hygiene and clean surfaces.  UTI: Reports she saw PCP 11/08/21 for UTI. She reports currently taking Macrobid for it, also taking Augmentin for ear infection.  Education provided on taking medications as ordered and to completion.  Take OTC probiotic-Florastor 250 mg twice daily while taking the antibiotics. Chronic pain syndrome: related to hx of hip fracture, low back pain. Pain is managed with Tramadol.   Pain managed by Dr. Elberta [facility] of [facility].    Left knee osteoarthritis: followed by Ortho; cortisone shots every 3 months. Follow up with Ortho as planned.  Follow up: Palliative care will continue to follow for complex medical decision making, advance care planning, and clarification of goals. Return 6 weeks or prn. Encouraged to call provider sooner with any concerns.   Family /Caregiver/Community Supports: Patient lives next to her niece Stacy Allen.  Strong family support system identified.  HOSPICE ELIGIBILITY/DIAGNOSIS: TBD  Chief Complaint: Follow-up visit  HISTORY OF PRESENT ILLNESS:  Stacy Allen is a 81 y.o. year old female  with multiple morbidities requiring close monitoring and with high risk of complications and  mortality: CAD, anxiety, osteoarthritis, skin cancer, chronic diastolic CHF, depression, fibromyalgia, hypertension, hyperlipidemia, lumbar stenosis, hypothyroidism, vitamin D deficiency, sleep apnea, obesity, peptic ulcer disease.  Recent C. difficile for which patient was hospitalized, now resolved on completion of fidaxomicin.  Patient in no acute distress, denies pain/discomfort.   History obtained from review of EMR, discussion with primary team, caregiver, family and/or Stacy Allen.  Review and summarization of Epic records shows history from other than patient. Rest of 10 point ROS asked and negative.  I reviewed as needed, available labs, patient records, imaging, studies and related documents from the EMR.  ROS General: NAD EYES: denies vision changes ENMT: denies dysphagia, reports fullness in right ear which is resolving Cardiovascular: denies chest pain/discomfort Pulmonary: denies cough, denies SOB Abdomen: endorses fair to good appetite, denies constipation/diarrhea GU: denies dysuria, urinary frequency MSK:  endorses weakness,  no falls reported Skin: denies rashes or wounds Neurological: denies pain, denies  insomnia Psych: Endorses positive  mood Heme/lymph/immuno: denies bruises, abnormal bleeding   PAST MEDICAL HISTORY:  Active Ambulatory Problems    Diagnosis Date Noted   Lumbar spondylosis 06/01/2017   Lumbar degenerative disc disease 06/01/2017   History of lumbar fusion 06/01/2017   Age related osteoporosis 06/01/2017   Chronic pain syndrome 06/01/2017   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 difficile diarrhea 10/21/2021   Dehydration 10/21/2021   Generalized weakness 10/21/2021   Resolved Ambulatory Problems    Diagnosis Date Noted   No Resolved Ambulatory Problems   Past Medical History:  Diagnosis Date   Adenomatous polyps    Allergic state    Anemia    Anginal pain ([facility])    Anxiety    Arthritis    Broken rib    Cancer ([facility])    CHF (congestive heart failure) ([facility])    Chicken pox    Complication of anesthesia    Coronary artery disease    Depression    Eczema    Fibromyalgia    GERD (gastroesophageal reflux disease)    Headache    Hemorrhoids    History of hiatal hernia    Hypercalcemia    Hyperlipidemia    Hypertension    Lumbar stenosis    Obesity    Peptic ulcer disease    Shortness of breath dyspnea    Sleep apnea    Vertigo    Vitamin D deficiency     SOCIAL HX:  Social History   Tobacco Use   Smoking status: Former    Types: Cigarettes    Quit date: 2000    Years since quitting: 23.3   Smokeless tobacco: Never  Substance Use Topics   Alcohol use: No     FAMILY HX:  Family History  Problem Relation Age of Onset   Hypertension Mother    Cancer Mother    Diabetes Father    Hypertension Father    Heart failure Father    Breast cancer Neg Hx       ALLERGIES:  Allergies  Allergen Reactions   Meloxicam Diarrhea   Morphine Other (See Comments) and Rash    Other reaction(s): Unknown Retain urine Other reaction(s): NOT KNOWN, Other  (See Comments) Retain urine Other reaction(s): side effect of severe rash   Zithromax [Azithromycin] Diarrhea    "fainting"   Adhesive [Tape]     blisters   Codeine Nausea And Vomiting   Lactose Intolerance (Gi) Other (See Comments)    "stomach pain"   Morphine And Related Hives   Red Dye Itching    "red food dye"   Shellfish-Derived Products Itching and Swelling   Latex Rash    With extended contact   Lodine [Etodolac] Rash   Penicillins Rash    Given on 05/07/20.  No reaction noted.     Pravastatin Rash      PERTINENT MEDICATIONS:  Outpatient Encounter Medications as of 11/11/2021  Medication Sig   acetaminophen (TYLENOL) 500 MG tablet Take 500 mg by mouth every 6 (six) hours as needed.   albuterol (PROVENTIL HFA;VENTOLIN HFA) 108 (90 BASE) MCG/ACT inhaler Inhale 2 puffs into the lungs.   amLODipine (NORVASC) 10 MG tablet Take 0.5 tablets (5 mg total) by mouth daily.   apixaban (ELIQUIS) 5 MG TABS tablet Take 1 tablet (5 mg total) by mouth 2 (two) times  daily. Take 2 tablets ('10mg'$ ) twice daily for 7 days, then 1 tablet ('5mg'$ ) twice daily   citalopram (CELEXA) 40 MG tablet Take 40 mg by mouth daily.   clonazePAM (KLONOPIN) 1 MG tablet Take 1 mg by mouth 2 (two) times daily. As needed for anxiety   DIFICID 200 MG TABS tablet Take 1 tablet (200 mg total) by mouth 2 (two) times daily.   EPINEPHrine 0.3 mg/0.3 mL IJ SOAJ injection Inject 0.3 mg into the muscle once.   gabapentin (NEURONTIN) 600 MG tablet Take 600 mg by mouth 3 (three) times daily.   levothyroxine (SYNTHROID) 150 MCG tablet Take 150 mcg by mouth daily.   metoprolol tartrate (LOPRESSOR) 25 MG tablet Take 1 tablet (25 mg total) by mouth daily.   omeprazole (PRILOSEC) 20 MG capsule Take 40 mg by mouth daily.    Polyethyl Glycol-Propyl Glycol (SYSTANE) 0.4-0.3 % GEL ophthalmic gel Place 1 application into both eyes 2 (two) times daily as needed (dry eyes).   [DISCONTINUED] loratadine (CLARITIN) 10 MG tablet Take 1 tablet  (10 mg total) by mouth daily. Take 1 tablet in the morning. As needed for itching.   [DISCONTINUED] ranitidine (ZANTAC) 150 MG capsule Take 1 capsule (150 mg total) by mouth 2 (two) times daily.   No facility-administered encounter medications on file as of 11/11/2021.   I spent 40 minutes providing this consultation; this includes time spent with patient/family, chart review and documentation. More than 50% of the time in this consultation was spent on counseling and coordinating communication   Thank you for the opportunity to participate in the care of Ms. Scheck.  The palliative care team will continue to follow. Please call our office at [phone] if we can be of additional assistance.   Note: Portions of this note were generated with [employer]. Dictation errors may occur despite best attempts at proofreading.  Teodoro Spray, NP

## 2021-11-16 ENCOUNTER — Other Ambulatory Visit: Payer: Self-pay | Admitting: Nurse Practitioner

## 2021-11-16 DIAGNOSIS — Z780 Asymptomatic menopausal state: Secondary | ICD-10-CM

## 2021-11-16 DIAGNOSIS — Z1231 Encounter for screening mammogram for malignant neoplasm of breast: Secondary | ICD-10-CM

## 2021-11-25 ENCOUNTER — Other Ambulatory Visit: Payer: Medicare HMO | Admitting: Hospice

## 2021-11-25 DIAGNOSIS — I5022 Chronic systolic (congestive) heart failure: Secondary | ICD-10-CM

## 2021-11-25 DIAGNOSIS — G894 Chronic pain syndrome: Secondary | ICD-10-CM

## 2021-11-25 DIAGNOSIS — M1712 Unilateral primary osteoarthritis, left knee: Secondary | ICD-10-CM

## 2021-11-25 DIAGNOSIS — Z515 Encounter for palliative care: Secondary | ICD-10-CM

## 2021-11-25 DIAGNOSIS — I10 Essential (primary) hypertension: Secondary | ICD-10-CM

## 2021-11-25 NOTE — Progress Notes (Signed)
[facility] Consult Note Telephone: [phone]  Fax: [phone]  PATIENT NAME: Stacy Allen [address] [place] [address] [id number] (home)  DOB: 1941-03-07 MRN: [id number]  PRIMARY CARE PROVIDER:    [name] Pouch, NP,  [place] [address] [phone]  REFERRING PROVIDER:   [name] Pouch, NP [address] [place],  [place] [address] [phone]  RESPONSIBLE PARTY:   Self [phone] HCPOA: [place]     Name Relation Home Work Mobile   [place] J Niece [phone]  [phone]   Waynetta Sandy   [phone]       Palliative Care was asked to follow this patient to address advance care planning, complex medical decision making and goals of care clarification.      ASSESSMENT AND / RECOMMENDATIONS:   CODE STATUS: Patient affirmed she is a Do Not Resuscitate.  NP signed DNR for patient to keep at home; same document uploaded to epic today.  Goals of Care: Goals include to maximize quality of life and symptom management  Symptom Management/Plan: Hypertension: Managed with Amlodipine, Lopressor.  CHF: Managed with Lasix. Followed by Cardiologist.  Education on elevation of extremities to promote circulation.  Adhere to fluid/salt limits. Routine CBC BMP.  Chronic pain syndrome: related to hx of hip fracture, low back pain. Pain is managed with Tramadol.  Pain managed by Dr. Elberta Spaniel of [facility] clinic.    Left knee osteoarthritis: followed by Ortho; cortisone shots every 3 months. Follow up with Ortho as planned.  Follow up: Palliative care will continue to follow for complex medical decision making, advance care planning, and clarification of goals. Return 6 weeks or prn. Encouraged to call provider sooner with any concerns.   Family /Caregiver/Community Supports: Patient lives next to her niece [name].  Strong family support system  identified.  HOSPICE ELIGIBILITY/DIAGNOSIS: TBD  Chief Complaint: Follow-up visit  HISTORY OF PRESENT ILLNESS:  Stacy Allen is an 81 y.o. year old female  with multiple morbidities requiring close monitoring and with high risk of complications and  mortality: Hypertension, CAD, anxiety, osteoarthritis, skin cancer, chronic diastolic CHF, depression, fibromyalgia, lumbar stenosis, hypothyroidism, vitamin D deficiency, sleep apnea, obesity, peptic ulcer disease.  Recent C. difficile for which patient was hospitalized, now resolved on completion of fidaxomicin.  Patient in no acute distress, denies pain/discomfort.   History obtained from review of EMR, discussion with primary team, caregiver, family and/or Ms. Uffelman.  Review and summarization of Epic records shows history from other than patient. Rest of 10 point ROS asked and negative.  I reviewed as needed, available labs, patient records, imaging, studies and related documents from the EMR.  ROS General: NAD EYES: denies vision changes ENMT: denies dysphagia Cardiovascular: denies chest pain/discomfort Pulmonary: denies cough, denies SOB Abdomen: endorses fair to good appetite, denies constipation/diarrhea GU: denies dysuria, urinary frequency MSK:  endorses weakness,  no falls reported Skin: denies rashes or wounds Neurological: denies pain, denies insomnia Psych: Endorses positive mood Heme/lymph/immuno: denies bruises, abnormal bleeding  Physical Exam: Height/Weight: 5 feet 3 inches/262 Ibs BP 144/90 R 18 P74 02 96% RA. Has not taken BP medicines this morning. She will take those after breakfast.  Constitutional: NAD General: Well groomed, cooperative EYES: anicteric sclera, lids intact, no discharge  ENMT: Moist mucous membrane CV: S1 S2, RRR, no LE edema Pulmonary: LCTA, no increased work of breathing, no cough, Abdomen: active BS + 4 quadrants, soft and non tender GU: no  suprapubic tenderness MSK: weakness, ambulatory  with rolling walker Skin: Warm and dry, no rashes or wounds on visible skin Neuro:  weakness, otherwise non focal Psych: non-anxious affect Hem/lymph/immuno: no widespread bruising   PAST MEDICAL HISTORY:  Active Ambulatory Problems    Diagnosis Date Noted   Lumbar spondylosis 06/01/2017   Lumbar degenerative disc disease 06/01/2017   History of lumbar fusion 06/01/2017   Age related osteoporosis 06/01/2017   Chronic pain syndrome 06/01/2017   SI joint arthritis 06/01/2017   Closed right hip fracture ([place]) 05/06/2020   Essential hypertension 05/06/2020   Hypothyroidism 05/06/2020   Shoulder pain, right    C. difficile diarrhea 10/21/2021   Dehydration 10/21/2021   Generalized weakness 10/21/2021   Resolved Ambulatory Problems    Diagnosis Date Noted   No Resolved Ambulatory Problems   Past Medical History:  Diagnosis Date   Adenomatous polyps    Allergic state    Anemia    Anginal pain (HCC)    Anxiety    Arthritis    Broken rib    Cancer (HCC)    CHF (congestive heart failure) (HCC)    Chicken pox    Complication of anesthesia    Coronary artery disease    Depression    Eczema    Fibromyalgia    GERD (gastroesophageal reflux disease)    Headache    Hemorrhoids    History of hiatal hernia    Hypercalcemia    Hyperlipidemia    Hypertension    Lumbar stenosis    Obesity    Peptic ulcer disease    Shortness of breath dyspnea    Sleep apnea    Vertigo    Vitamin D deficiency     SOCIAL HX:  Social History   Tobacco Use   Smoking status: Former    Types: Cigarettes    Quit date: 2000    Years since quitting: 23.4   Smokeless tobacco: Never  Substance Use Topics   Alcohol use: No     FAMILY HX:  Family History  Problem Relation Age of Onset   Hypertension Mother    Cancer Mother    Diabetes Father    Hypertension Father    Heart failure Father    Breast cancer Neg Hx       ALLERGIES:  Allergies  Allergen Reactions   Meloxicam Diarrhea    Morphine Other (See Comments) and Rash    Other reaction(s): Unknown Retain urine Other reaction(s): NOT KNOWN, Other (See Comments) Retain urine Other reaction(s): side effect of severe rash   Zithromax [Azithromycin] Diarrhea    "fainting"   Adhesive [Tape]     blisters   Codeine Nausea And Vomiting   Lactose Intolerance (Gi) Other (See Comments)    "stomach pain"   Morphine And Related Hives   Red Dye Itching    "red food dye"   Shellfish-Derived Products Itching and Swelling   Latex Rash    With extended contact   Lodine [Etodolac] Rash   Penicillins Rash    Given on 05/07/20.  No reaction noted.     Pravastatin Rash      PERTINENT MEDICATIONS:  Outpatient Encounter Medications as of 11/25/2021  Medication Sig   acetaminophen (TYLENOL) 500 MG tablet Take 500 mg by mouth every 6 (six) hours as needed.   albuterol (PROVENTIL HFA;VENTOLIN HFA) 108 (90 BASE) MCG/ACT inhaler Inhale 2 puffs into the lungs.   amLODipine (NORVASC) 10 MG tablet Take 0.5 tablets (5 mg total)  by mouth daily.   apixaban (ELIQUIS) 5 MG TABS tablet Take 1 tablet (5 mg total) by mouth 2 (two) times daily. Take 2 tablets ('10mg'$ ) twice daily for 7 days, then 1 tablet ('5mg'$ ) twice daily   citalopram (CELEXA) 40 MG tablet Take 40 mg by mouth daily.   clonazePAM (KLONOPIN) 1 MG tablet Take 1 mg by mouth 2 (two) times daily. As needed for anxiety   DIFICID 200 MG TABS tablet Take 1 tablet (200 mg total) by mouth 2 (two) times daily.   EPINEPHrine 0.3 mg/0.3 mL IJ SOAJ injection Inject 0.3 mg into the muscle once.   gabapentin (NEURONTIN) 600 MG tablet Take 600 mg by mouth 3 (three) times daily.   levothyroxine (SYNTHROID) 150 MCG tablet Take 150 mcg by mouth daily.   metoprolol tartrate (LOPRESSOR) 25 MG tablet Take 1 tablet (25 mg total) by mouth daily.   omeprazole (PRILOSEC) 20 MG capsule Take 40 mg by mouth daily.    Polyethyl Glycol-Propyl Glycol (SYSTANE) 0.4-0.3 % GEL ophthalmic gel Place 1 application  into both eyes 2 (two) times daily as needed (dry eyes).   [DISCONTINUED] loratadine (CLARITIN) 10 MG tablet Take 1 tablet (10 mg total) by mouth daily. Take 1 tablet in the morning. As needed for itching.   [DISCONTINUED] ranitidine (ZANTAC) 150 MG capsule Take 1 capsule (150 mg total) by mouth 2 (two) times daily.   No facility-administered encounter medications on file as of 11/25/2021.   I spent 60 minutes providing this consultation; this includes time spent with patient/family, chart review and documentation. More than 50% of the time in this consultation was spent on counseling and coordinating communication   Thank you for the opportunity to participate in the care of Ms. Etcheverry.  The palliative care team will continue to follow. Please call our office at [phone] if we can be of additional assistance.   Note: Portions of this note were generated with [employer]. Dictation errors may occur despite best attempts at proofreading.  Teodoro Spray, NP

## 2021-12-22 ENCOUNTER — Emergency Department: Payer: Medicare HMO

## 2021-12-22 ENCOUNTER — Emergency Department
Admission: EM | Admit: 2021-12-22 | Discharge: 2021-12-22 | Disposition: A | Discharge status: Home / Self Care | Payer: Medicare HMO | Attending: Emergency Medicine | Admitting: Emergency Medicine

## 2021-12-22 ENCOUNTER — Encounter: Payer: Self-pay | Admitting: Emergency Medicine

## 2021-12-22 ENCOUNTER — Other Ambulatory Visit: Payer: Self-pay

## 2021-12-22 DIAGNOSIS — I110 Hypertensive heart disease with heart failure: Secondary | ICD-10-CM | POA: Diagnosis not present

## 2021-12-22 DIAGNOSIS — Z7901 Long term (current) use of anticoagulants: Secondary | ICD-10-CM | POA: Diagnosis not present

## 2021-12-22 DIAGNOSIS — I2510 Atherosclerotic heart disease of native coronary artery without angina pectoris: Secondary | ICD-10-CM | POA: Diagnosis not present

## 2021-12-22 DIAGNOSIS — I5030 Unspecified diastolic (congestive) heart failure: Secondary | ICD-10-CM | POA: Insufficient documentation

## 2021-12-22 DIAGNOSIS — K529 Noninfective gastroenteritis and colitis, unspecified: Secondary | ICD-10-CM | POA: Diagnosis not present

## 2021-12-22 DIAGNOSIS — R112 Nausea with vomiting, unspecified: Secondary | ICD-10-CM

## 2021-12-22 LAB — CBC WITH DIFFERENTIAL/PLATELET
Abs Immature Granulocytes: 0.01 10*3/uL (ref 0.00–0.07)
Basophils Absolute: 0.1 10*3/uL (ref 0.0–0.1)
Basophils Relative: 1 %
Eosinophils Absolute: 0.1 10*3/uL (ref 0.0–0.5)
Eosinophils Relative: 2 %
HCT: 35.9 % — ABNORMAL LOW (ref 36.0–46.0)
Hemoglobin: 10.7 g/dL — ABNORMAL LOW (ref 12.0–15.0)
Immature Granulocytes: 0 %
Lymphocytes Relative: 19 %
Lymphs Abs: 1.1 10*3/uL (ref 0.7–4.0)
MCH: 25.7 pg — ABNORMAL LOW (ref 26.0–34.0)
MCHC: 29.8 g/dL — ABNORMAL LOW (ref 30.0–36.0)
MCV: 86.1 fL (ref 80.0–100.0)
Monocytes Absolute: 0.6 10*3/uL (ref 0.1–1.0)
Monocytes Relative: 10 %
Neutro Abs: 3.9 10*3/uL (ref 1.7–7.7)
Neutrophils Relative %: 68 %
Platelets: 293 10*3/uL (ref 150–400)
RBC: 4.17 MIL/uL (ref 3.87–5.11)
RDW: 17.2 % — ABNORMAL HIGH (ref 11.5–15.5)
WBC: 5.8 10*3/uL (ref 4.0–10.5)
nRBC: 0 % (ref 0.0–0.2)

## 2021-12-22 LAB — C DIFFICILE QUICK SCREEN W PCR REFLEX
C Diff antigen: NEGATIVE
C Diff interpretation: NOT DETECTED
C Diff toxin: NEGATIVE

## 2021-12-22 LAB — URINALYSIS, ROUTINE W REFLEX MICROSCOPIC
Bilirubin Urine: NEGATIVE
Glucose, UA: NEGATIVE mg/dL
Ketones, ur: NEGATIVE mg/dL
Leukocytes,Ua: NEGATIVE
Nitrite: NEGATIVE
Protein, ur: NEGATIVE mg/dL
Specific Gravity, Urine: 1.012 (ref 1.005–1.030)
Squamous Epithelial / HPF: NONE SEEN (ref 0–5)
pH: 7 (ref 5.0–8.0)

## 2021-12-22 LAB — COMPREHENSIVE METABOLIC PANEL
ALT: 14 U/L (ref 0–44)
AST: 18 U/L (ref 15–41)
Albumin: 3.8 g/dL (ref 3.5–5.0)
Alkaline Phosphatase: 88 U/L (ref 38–126)
Anion gap: 10 (ref 5–15)
BUN: 18 mg/dL (ref 8–23)
CO2: 25 mmol/L (ref 22–32)
Calcium: 9 mg/dL (ref 8.9–10.3)
Chloride: 105 mmol/L (ref 98–111)
Creatinine, Ser: 0.82 mg/dL (ref 0.44–1.00)
GFR, Estimated: >60 mL/min (ref >60)
Glucose, Bld: 114 mg/dL — ABNORMAL HIGH (ref 70–99)
Potassium: 3.9 mmol/L (ref 3.5–5.1)
Sodium: 140 mmol/L (ref 135–145)
Total Bilirubin: 0.6 mg/dL (ref 0.3–1.2)
Total Protein: 7.3 g/dL (ref 6.5–8.1)

## 2021-12-22 LAB — TROPONIN I (HIGH SENSITIVITY): Troponin I (High Sensitivity): 7 ng/L (ref <18)

## 2021-12-22 LAB — LIPASE, BLOOD: Lipase: 24 U/L (ref 11–51)

## 2021-12-22 MED ORDER — IOHEXOL 300 MG/ML  SOLN
100.0000 mL | Freq: Once | INTRAMUSCULAR | Status: AC | PRN
  Administered 2021-12-22: 100 mL via INTRAVENOUS

## 2021-12-22 MED ORDER — ONDANSETRON 4 MG PO TBDP: 4.0000 mg | ORAL_TABLET | Freq: Three times a day (TID) | ORAL | 0 refills | Status: DC | PRN

## 2021-12-22 MED ORDER — LACTATED RINGERS IV BOLUS
1000.0000 mL | Freq: Once | INTRAVENOUS | Status: AC
  Administered 2021-12-22: 1000 mL via INTRAVENOUS

## 2021-12-22 NOTE — ED Provider Notes (Signed)
[facility] Provider Note    Event Date/Time   First MD Initiated Contact with Patient 12/22/21 [phone]     (approximate)   History   Diarrhea   HPI  Stacy Allen is a 81 y.o. female who presents to the ED for evaluation of Diarrhea   I reviewed DC summary from 4/29.  She was admitted medically for C. difficile diarrhea. History of CAD, anxiety, diastolic CHF, fibromyalgia, HTN/HLD and PUD.  Anticoagulated on Eliquis.  Patient presents to the ED for evaluation of 3 days of abdominal cramping and diarrhea.  She reports at least 20 episodes of yellow-colored diarrhea per day, consistent with her history of C. difficile.  Reports subjective chills and fevers without any documented fevers.  Denies any dysuria, emesis, fever, syncope, chest pain or shortness of breath.  No hematochezia or melena.   Physical Exam   Triage Vital Signs: ED Triage Vitals [12/22/21 0652]  Enc Vitals Group     BP (!) 145/88     Pulse Rate 73     Resp 18     Temp 98.7 F (37.1 C)     Temp Source Oral     SpO2 97 %     Weight 260 lb (117.9 kg)     Height '5\' 3"'$  (1.6 m)     Head Circumference      Peak Flow      Pain Score 5     Pain Loc      Pain Edu?      Excl. in [place]?     Most recent vital signs: Vitals:   12/22/21 1100 12/22/21 1200  BP: (!) 153/97 (!) 153/116  Pulse: (!) 58 69  Resp: 18   Temp:    SpO2: 95% 97%    General: Awake, no distress.  Morbidly obese.  Pleasant.  Appears dry. CV:  Good peripheral perfusion.  Resp:  Normal effort.  Abd:  No distention.  Diffuse mild tenderness without peritoneal features. MSK:  No deformity noted.  Neuro:  No focal deficits appreciated. Other:     ED Results / Procedures / Treatments   Labs (all labs ordered are listed, but only abnormal results are displayed) Labs Reviewed  CBC WITH DIFFERENTIAL/PLATELET - Abnormal; Notable for the following components:      Result Value   Hemoglobin 10.7 (*)    HCT 35.9  (*)    MCH 25.7 (*)    MCHC 29.8 (*)    RDW 17.2 (*)    All other components within normal limits  COMPREHENSIVE METABOLIC PANEL - Abnormal; Notable for the following components:   Glucose, Bld 114 (*)    All other components within normal limits  URINALYSIS, ROUTINE W REFLEX MICROSCOPIC - Abnormal; Notable for the following components:   Color, Urine YELLOW (*)    APPearance CLEAR (*)    Hgb urine dipstick SMALL (*)    Bacteria, UA RARE (*)    All other components within normal limits  C DIFFICILE QUICK SCREEN W PCR REFLEX    LIPASE, BLOOD  TROPONIN I (HIGH SENSITIVITY)    EKG Sinus rhythm with a rate of 56 bpm.  Normal axis and intervals.  No evidence of acute ischemia.  RADIOLOGY CXR interpreted by me without evidence of acute cardiopulmonary pathology.  Official radiology report(s): CT ABDOMEN PELVIS W CONTRAST  Result Date: 12/22/2021 CLINICAL DATA:  81 year old female with lower abdominal pain and diarrhea. History of C difficile colitis. EXAM: CT ABDOMEN  AND PELVIS WITH CONTRAST TECHNIQUE: Multidetector CT imaging of the abdomen and pelvis was performed using the standard protocol following bolus administration of intravenous contrast. RADIATION DOSE REDUCTION: This exam was performed according to the departmental dose-optimization program which includes automated exposure control, adjustment of the mA and/or kV according to patient size and/or use of iterative reconstruction technique. CONTRAST:  190m OMNIPAQUE IOHEXOL 300 MG/ML  SOLN COMPARISON:  CT Abdomen and Pelvis 12/04/2020 and earlier. 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 No cardiomegaly, pericardial, or pleural effusion. Chronic left lower lobe atelectasis. Hepatobiliary: Chronically absent gallbladder. Stable and negative liver. Pancreas: Stable partial pancreatic atrophy. Spleen: Stable, negative. Adrenals/Urinary Tract: Stable, negative  adrenal glands and kidneys. Symmetric renal enhancement and contrast excretion. Bladder is decompressed but there is a trace amount of gas within the bladder (sagittal image 89). Stomach/Bowel: Decompressed rectosigmoid colon with no inflammation. Mild sigmoid diverticulosis. Gas and stool containing upstream large bowel with no wall thickening or inflammation. Prior appendectomy. Negative terminal ileum. No dilated small bowel. Intra-abdominal stomach and duodenum appear negative. No free air, free fluid, or mesenteric inflammation identified. Vascular/Lymphatic: Aortoiliac calcified atherosclerosis. Normal caliber abdominal aorta. Suboptimal intra vascular contrast bolus but grossly patent major arterial and portal venous structures. No lymphadenopathy identified. Reproductive: Surgically absent uterus. Normal ovaries. Other: No pelvic free fluid. Multiple pelvic phleboliths. Musculoskeletal: Advanced lower thoracic and lumbar spine degeneration with previous multilevel posterior and interbody fusion changes. Right hip arthroplasty. No acute osseous abnormality identified. IMPRESSION: 1. Trace amount of gas within the bladder, suspicious for UTI unless explained by recent catheterization. 2. Otherwise no acute or inflammatory process identified in the abdomen or pelvis. Chronic gastric paraesophageal hernia. Aortic Atherosclerosis (ICD10-I70.0). Electronically Signed   By: HGenevie AnnM.D.   On: 12/22/2021 09:44   DG Chest Portable 1 View  Result Date: 12/22/2021 CLINICAL DATA:  Diarrhea.  Exhaustion.  Dyspnea on exertion. EXAM: PORTABLE CHEST 1 VIEW COMPARISON:  04/19/2021 FINDINGS: Retrocardiac density compatible with moderate sized hiatal hernia. Mild enlargement of the cardiopericardial silhouette, without edema. The lungs appear otherwise clear. No blunting of the costophrenic angles. IMPRESSION: 1. Mild enlargement of the cardiopericardial silhouette, without edema. 2. Moderate-sized hiatal hernia.  Electronically Signed   By: WVan ClinesM.D.   On: 12/22/2021 08:47    PROCEDURES and INTERVENTIONS:  .1-3 Lead EKG Interpretation  Performed by: SVladimir Crofts MD Authorized by: SVladimir Crofts MD     Interpretation: normal     ECG rate:  70   ECG rate assessment: normal     Rhythm: sinus rhythm     Ectopy: none     Conduction: normal     Medications  lactated ringers bolus 1,000 mL (0 mLs Intravenous Stopped 12/22/21 1029)  iohexol (OMNIPAQUE) 300 MG/ML solution 100 mL (100 mLs Intravenous Contrast Given 12/22/21 0916)     IMPRESSION / MDM / ASSESSMENT AND PLAN / ED COURSE  I reviewed the triage vital signs and the nursing notes.  Differential diagnosis includes, but is not limited to, question of difficile, gastroenteritis, viral syndrome, acute cystitis, SBO, AKI  {Patient presents with symptoms of an acute illness or injury that is potentially life-threatening.  81year old female presents with few days of N/V/D with a possible gastroenteritis and ultimately suitable for outpatient management.  She has mild and poorly localizing abdominal tenderness without peritoneal features.  Blood work is benign with normal metabolic panel.  Mild and chronic normocytic anemia.  Urine without  infectious features.  Negative troponins and lipase.  C. difficile testing is negative.  CT abdomen/pelvis is reassuring and her CXR is clear.  I considered observation admission for this patient, but she is ultimately suitable for trial of outpatient management after we have a shared decision-making conversation.  We discussed return precautions..  Clinical Course as of 12/22/21 1245  Wed Dec 22, 2021  1243 Reassessed.  Patient reports feeling a little bit better.  We discussed reassuring work-up overall.  Discussed possible etiologies of her symptoms.  Discussed management at home and return precautions. [DS]    Clinical Course User Index [DS] Vladimir Crofts, MD     FINAL CLINICAL IMPRESSION(S)  / ED DIAGNOSES   Final diagnoses:  Nausea vomiting and diarrhea  Gastroenteritis     Rx / DC Orders   ED Discharge Orders          Ordered    ondansetron (ZOFRAN-ODT) 4 MG disintegrating tablet  Every 8 hours PRN        12/22/21 1244             Note:  This document was prepared using Dragon voice recognition software and may include unintentional dictation errors.   Vladimir Crofts, MD 12/22/21 1246

## 2021-12-22 NOTE — Discharge Instructions (Signed)
Use Tylenol for pain and fevers.  Up to 1000 mg per dose, up to 4 times per day.  Do not take more than 4000 mg of Tylenol/acetaminophen within 24 hours..  Use Zofran as needed for nausea and vomiting.  This can help settle upset stomach associated with diarrhea as well.  If you develop any fevers or worsening symptoms then please return to the ED

## 2021-12-22 NOTE — ED Triage Notes (Signed)
Pt reports diarrhea x wk with lower abd pain; st hx cdiff and concerned it may be same

## 2022-01-20 ENCOUNTER — Other Ambulatory Visit: Payer: Medicare HMO | Admitting: Hospice

## 2022-01-20 ENCOUNTER — Telehealth: Payer: Self-pay | Admitting: Hospice

## 2022-01-20 DIAGNOSIS — G894 Chronic pain syndrome: Secondary | ICD-10-CM

## 2022-01-20 DIAGNOSIS — M1712 Unilateral primary osteoarthritis, left knee: Secondary | ICD-10-CM

## 2022-01-20 DIAGNOSIS — R112 Nausea with vomiting, unspecified: Secondary | ICD-10-CM

## 2022-01-20 DIAGNOSIS — D509 Iron deficiency anemia, unspecified: Secondary | ICD-10-CM

## 2022-01-20 DIAGNOSIS — Z515 Encounter for palliative care: Secondary | ICD-10-CM

## 2022-01-20 NOTE — Progress Notes (Signed)
[facility] Consult Note Telephone: [phone]  Fax: [phone]  PATIENT NAME: Stacy Allen [address] [place] [address] [phone] (home)  DOB: [birth year] MRN: [id number]  PRIMARY CARE PROVIDER:    [name] Pouch, NP,  [place] [address] [phone]  REFERRING PROVIDER:   [name] Pouch, NP [address] [place],  [place] [address] [phone]  RESPONSIBLE PARTY:   Self [phone] HCPOA: [place]     Name Relation Home Work Mobile   Geary J Niece [phone]  [phone]   Waynetta Sandy   [phone]     TELEHEALTH VISIT STATEMENT Due to the COVID-19 crisis, this visit was done via telemedicine from my office and it was initiated and consent by this patient and or family.  I connected with patient OR PROXY by a telephone/video  and verified that I am speaking with the correct person. I discussed the limitations of evaluation and management by telemedicine. Patient/proxy expressed understanding and agreed to proceed. Palliative Care was asked to follow this patient to address advance care planning, complex medical decision making and goals of care clarification.         ASSESSMENT AND / RECOMMENDATIONS:   CODE STATUS: Patient affirmed she is a Do Not Resuscitate.   Goals of Care: Goals include to maximize quality of life and symptom management  Symptom Management/Plan: Nausea vomiting and Diarrhea: Seen in the ED 12/22/21, not admitted; C-Diff negative currently, hx of  C-Diff. Managed with Imodium. Continue Zofran for nausea/vomiting. Follow up with GI as planned. No diarrhea today. Reports no longer taking caffeine because it is a causative factor.  Chronic pain syndrome: related to hx of hip fracture, low back pain. Pain is managed with Tramadol.  Pain managed by Dr. Elberta Spaniel of [facility] clinic.   Iron deficiency anemia: Continue  Ferrous sulphate as ordered.  Routine CBC CMP Left knee osteoarthritis: followed by Ortho; cortisone shots every 3 months. Follow up with Ortho as planned.  Follow up: Palliative care will continue to follow for complex medical decision making, advance care planning, and clarification of goals. Return 6 weeks or prn. Encouraged to call provider sooner with any concerns.   Family /Caregiver/Community Supports: Patient lives next to her niece [name].  Strong family support system identified.  HOSPICE ELIGIBILITY/DIAGNOSIS: TBD  Chief Complaint: Follow-up visit  HISTORY OF PRESENT ILLNESS:  Stacy Allen is a 81 y.o. year old female  with multiple morbidities requiring close monitoring and with high risk of complications and  mortality: Hypertension, CAD, anxiety, osteoarthritis, skin cancer, chronic diastolic CHF, depression, fibromyalgia, lumbar stenosis, hypothyroidism, vitamin D deficiency, sleep apnea, obesity, peptic ulcer disease.  Recent C. difficile in April 2023 for which patient was hospitalized, resolved on completion of fidaxomicin.  Patient in no acute distress, denies pain/discomfort.   History obtained from review of EMR, discussion with primary team, caregiver, family and/or Ms. Scarber.  Review and summarization of Epic records shows history from other than patient. Rest of 10 point ROS asked and negative.  I reviewed as needed, available labs, patient records, imaging, studies and related documents from the EMR.   PAST MEDICAL HISTORY:  Active Ambulatory Problems    Diagnosis Date Noted   Lumbar spondylosis 06/01/2017   Lumbar degenerative disc disease 06/01/2017   History of lumbar fusion 06/01/2017   Age related osteoporosis 06/01/2017   Chronic pain syndrome 06/01/2017   SI joint arthritis 06/01/2017   Closed right  hip fracture ([facility]) 05/06/2020   Essential hypertension 05/06/2020   Hypothyroidism 05/06/2020   Shoulder pain, right    C. difficile diarrhea 10/21/2021    Dehydration 10/21/2021   Generalized weakness 10/21/2021   Resolved Ambulatory Problems    Diagnosis Date Noted   No Resolved Ambulatory Problems   Past Medical History:  Diagnosis Date   Adenomatous polyps    Allergic state    Anemia    Anginal pain (HCC)    Anxiety    Arthritis    Broken rib    Cancer (HCC)    CHF (congestive heart failure) (HCC)    Chicken pox    Complication of anesthesia    Coronary artery disease    Depression    Eczema    Fibromyalgia    GERD (gastroesophageal reflux disease)    Headache    Hemorrhoids    History of hiatal hernia    Hypercalcemia    Hyperlipidemia    Hypertension    Lumbar stenosis    Obesity    Peptic ulcer disease    Shortness of breath dyspnea    Sleep apnea    Vertigo    Vitamin D deficiency     SOCIAL HX:  Social History   Tobacco Use   Smoking status: Former    Types: Cigarettes    Quit date: 2000    Years since quitting: 23.5   Smokeless tobacco: Never  Substance Use Topics   Alcohol use: No     FAMILY HX:  Family History  Problem Relation Age of Onset   Hypertension Mother    Cancer Mother    Diabetes Father    Hypertension Father    Heart failure Father    Breast cancer Neg Hx       ALLERGIES:  Allergies  Allergen Reactions   Meloxicam Diarrhea   Morphine Other (See Comments) and Rash    Other reaction(s): Unknown Retain urine Other reaction(s): NOT KNOWN, Other (See Comments) Retain urine Other reaction(s): side effect of severe rash   Zithromax [Azithromycin] Diarrhea    "fainting"   Adhesive [Tape]     blisters   Codeine Nausea And Vomiting   Lactose Intolerance (Gi) Other (See Comments)    "stomach pain"   Morphine And Related Hives   Red Dye Itching    "red food dye"   Shellfish-Derived Products Itching and Swelling   Latex Rash    With extended contact   Lodine [Etodolac] Rash   Penicillins Rash    Given on 05/07/20.  No reaction noted.     Pravastatin Rash       PERTINENT MEDICATIONS:  Outpatient Encounter Medications as of 01/20/2022  Medication Sig   acetaminophen (TYLENOL) 500 MG tablet Take 500 mg by mouth every 6 (six) hours as needed.   albuterol (PROVENTIL HFA;VENTOLIN HFA) 108 (90 BASE) MCG/ACT inhaler Inhale 2 puffs into the lungs.   amLODipine (NORVASC) 10 MG tablet Take 0.5 tablets (5 mg total) by mouth daily.   apixaban (ELIQUIS) 5 MG TABS tablet Take 1 tablet (5 mg total) by mouth 2 (two) times daily. Take 2 tablets ('10mg'$ ) twice daily for 7 days, then 1 tablet ('5mg'$ ) twice daily   citalopram (CELEXA) 40 MG tablet Take 40 mg by mouth daily.   clonazePAM (KLONOPIN) 1 MG tablet Take 1 mg by mouth 2 (two) times daily. As needed for anxiety   DIFICID 200 MG TABS tablet Take 1 tablet (200 mg total) by mouth 2 (two)  times daily.   EPINEPHrine 0.3 mg/0.3 mL IJ SOAJ injection Inject 0.3 mg into the muscle once.   gabapentin (NEURONTIN) 600 MG tablet Take 600 mg by mouth 3 (three) times daily.   levothyroxine (SYNTHROID) 150 MCG tablet Take 150 mcg by mouth daily.   metoprolol tartrate (LOPRESSOR) 25 MG tablet Take 1 tablet (25 mg total) by mouth daily.   omeprazole (PRILOSEC) 20 MG capsule Take 40 mg by mouth daily.    ondansetron (ZOFRAN-ODT) 4 MG disintegrating tablet Take 1 tablet (4 mg total) by mouth every 8 (eight) hours as needed.   Polyethyl Glycol-Propyl Glycol (SYSTANE) 0.4-0.3 % GEL ophthalmic gel Place 1 application into both eyes 2 (two) times daily as needed (dry eyes).   [DISCONTINUED] loratadine (CLARITIN) 10 MG tablet Take 1 tablet (10 mg total) by mouth daily. Take 1 tablet in the morning. As needed for itching.   [DISCONTINUED] ranitidine (ZANTAC) 150 MG capsule Take 1 capsule (150 mg total) by mouth 2 (two) times daily.   No facility-administered encounter medications on file as of 01/20/2022.   I spent 40 minutes providing this consultation; this includes time spent with patient/family, chart review and documentation. More  than 50% of the time in this consultation was spent on counseling and coordinating communication   Thank you for the opportunity to participate in the care of Ms. Ibsen.  The palliative care team will continue to follow. Please call our office at [phone] if we can be of additional assistance.   Note: Portions of this note were generated with [employer]. Dictation errors may occur despite best attempts at proofreading.  Teodoro Spray, NP

## 2022-01-24 ENCOUNTER — Other Ambulatory Visit: Payer: Self-pay | Admitting: Internal Medicine

## 2022-01-24 DIAGNOSIS — R197 Diarrhea, unspecified: Secondary | ICD-10-CM

## 2022-01-24 NOTE — Telephone Encounter (Signed)
Telephone encounter.

## 2022-02-12 IMAGING — CR DG CHEST 2V
1 series · 2 of 2 positions shown · non-contrast
Comparison: February 17, 2019.

CLINICAL DATA: chest pain

EXAM:
CHEST - 2 VIEW

[Series 1: dg chest 2 view · 0.14mm/px · 2 of 2 slices shown]
[im 1/2]
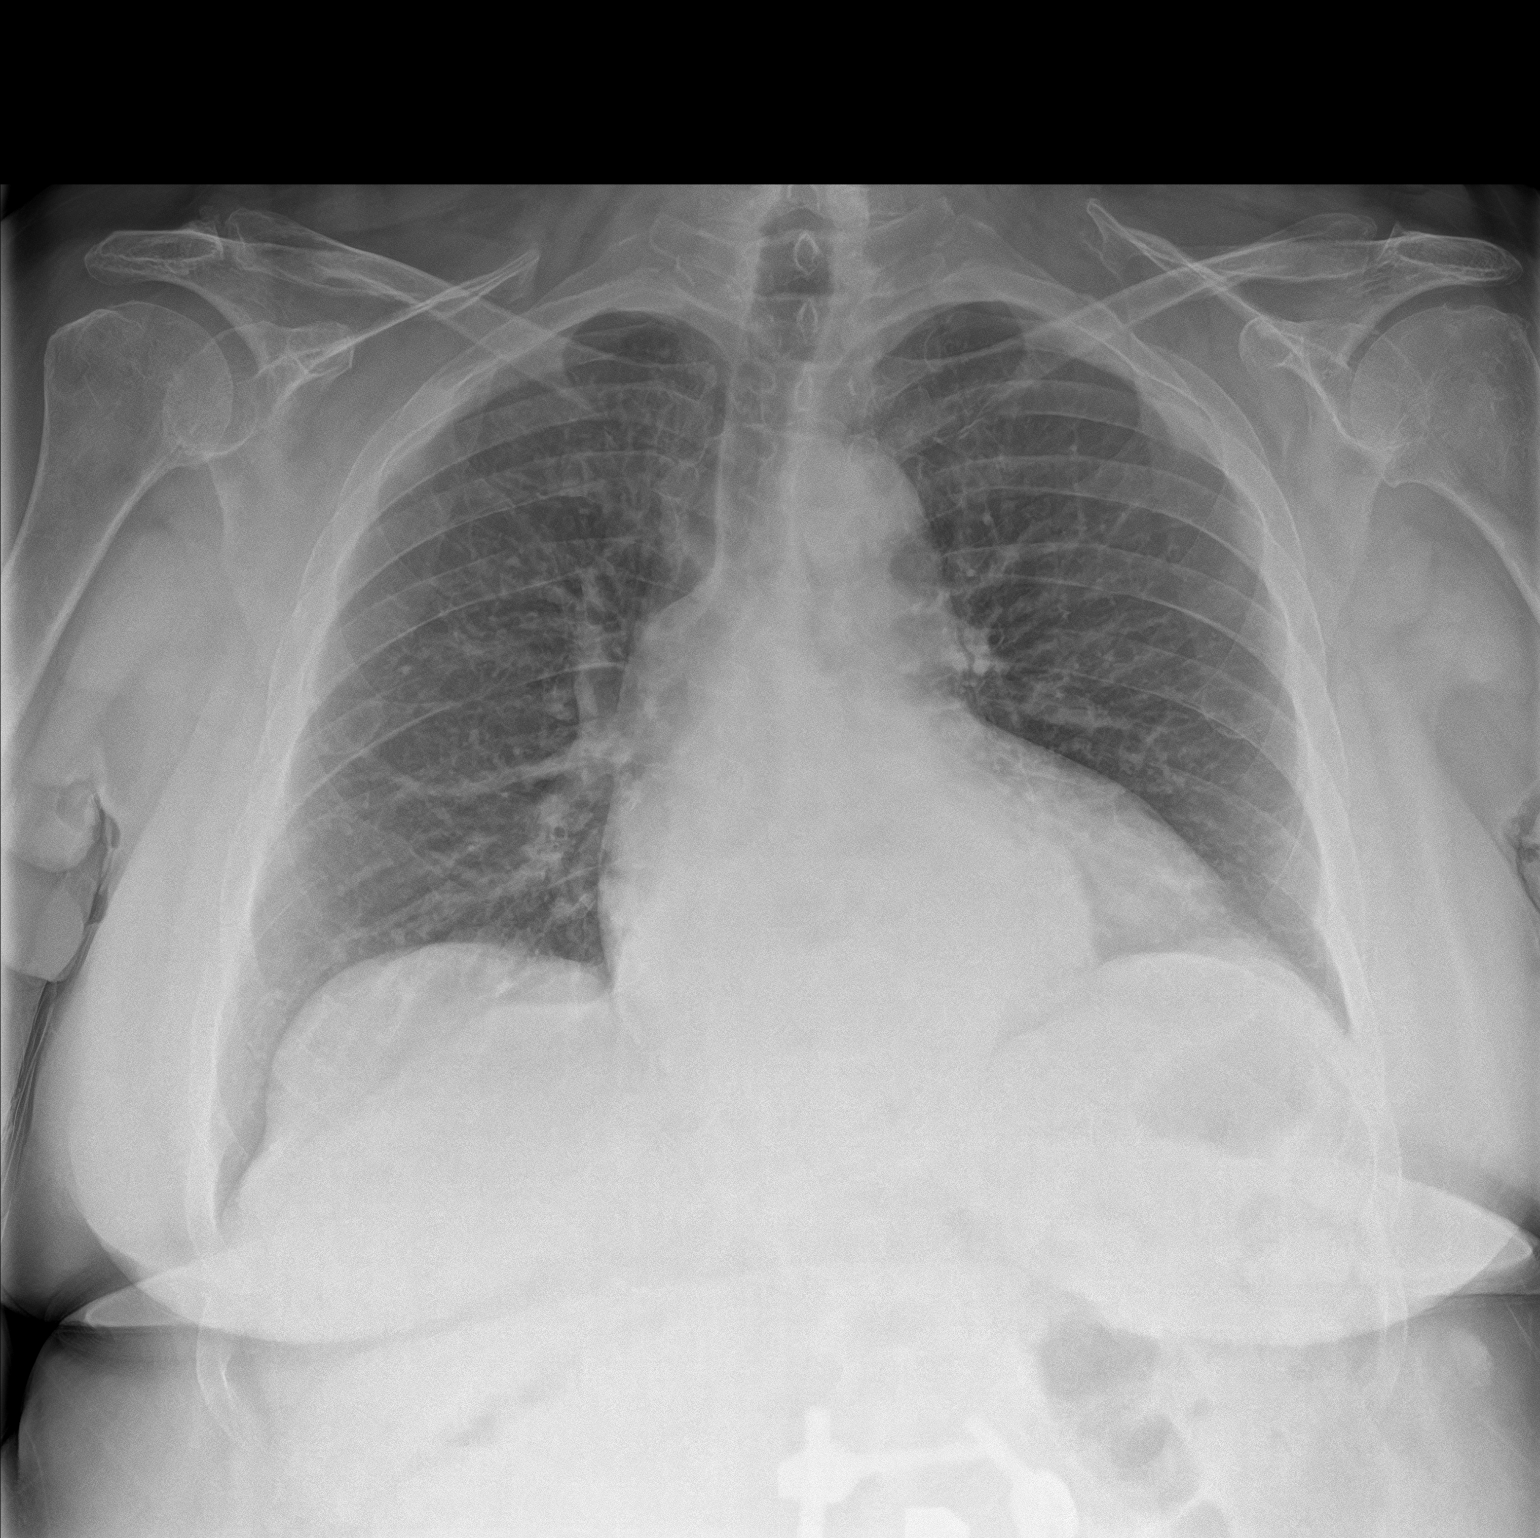
[im 2/2]
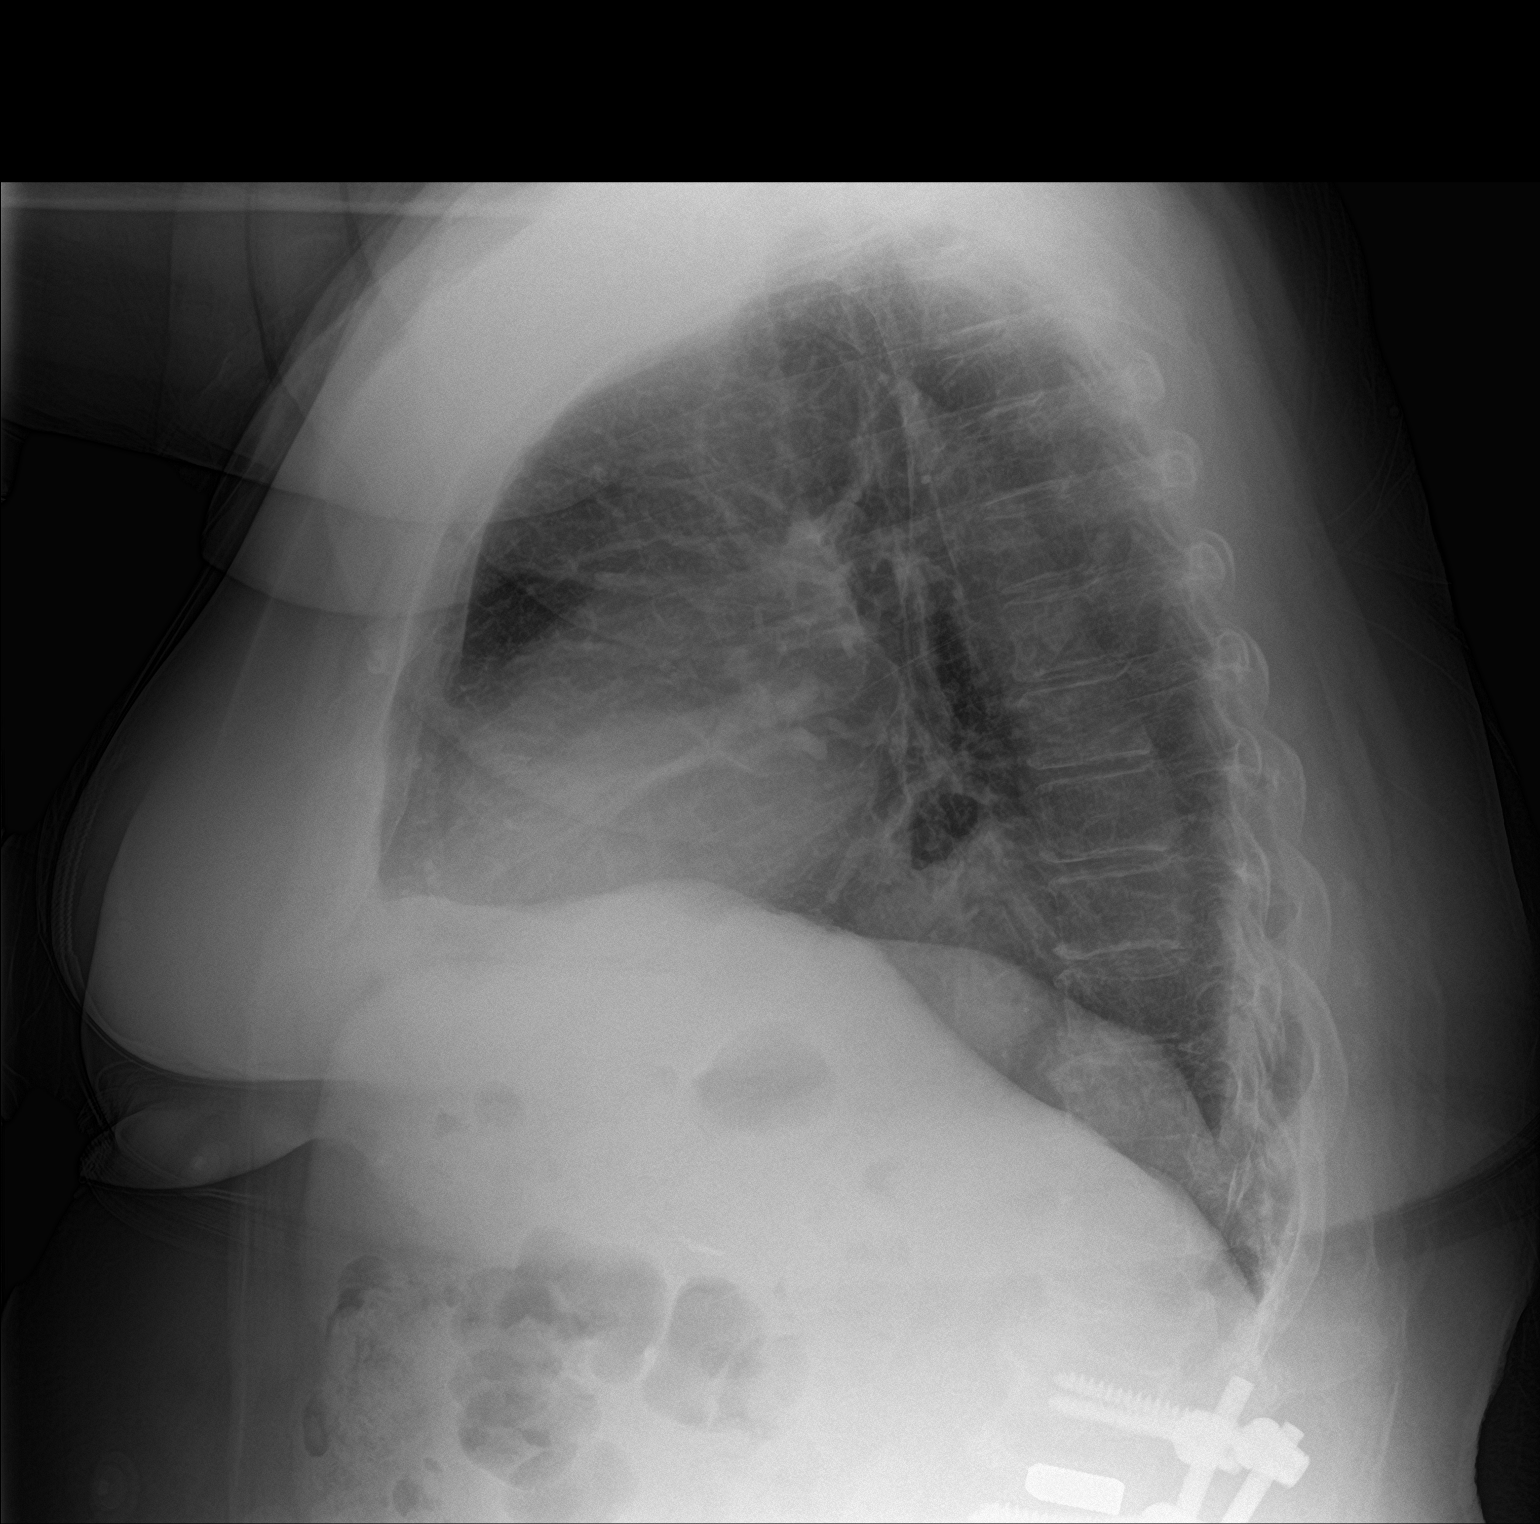

[2 of 2 positions shown; findings below may reference images not displayed]

FINDINGS: Low lung volumes. No consolidation. No visible pleural effusions or
pneumothorax. Moderate hiatal hernia. Mildly enlarged cardiac
silhouette, likely accentuated by low lung volumes and similar to
prior. Partially imaged levocurvature of the lumbar spine with
lumbar fusion hardware.
IMPRESSION: 1. Low lung volumes without evidence of acute cardiopulmonary
disease.
2. Moderate hiatal hernia.

## 2022-02-12 IMAGING — CT CT HEAD W/O CM
3 series · 16 of 47 positions shown, 19 images · non-contrast
Comparison: MRI 12/04/2012.  CT 09/13/2012.

CLINICAL DATA: Headache, new or worsening.  Anticoagulated.

EXAM:
CT HEAD WITHOUT CONTRAST
TECHNIQUE: Contiguous axial images were obtained from the base of the skull
through the vertex without intravenous contrast.

[Series 2: head wo · axial · 0.45mm/px · z∈[-320,-185]mm · 10 of 33 slices shown, 13 images]
[im 3/33  brain]
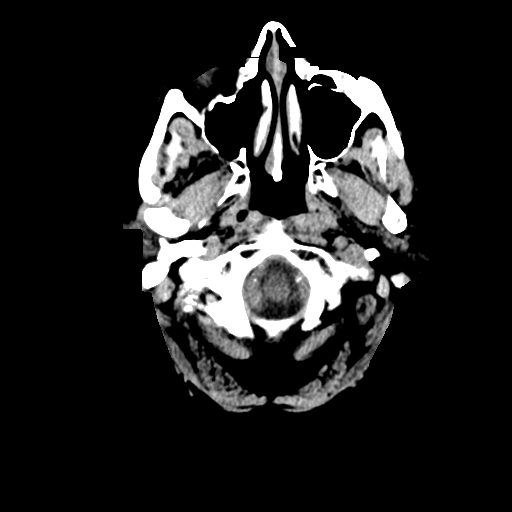
[im 3/33  bone]
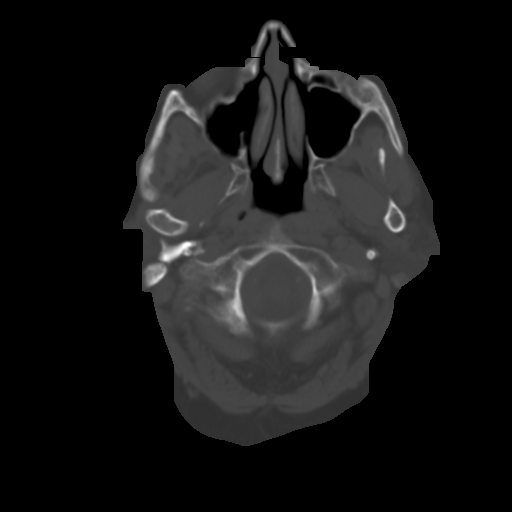
[im 6/33  brain]
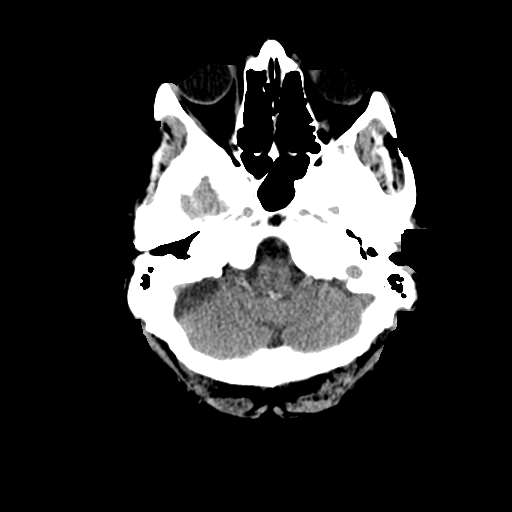
[im 9/33  brain]
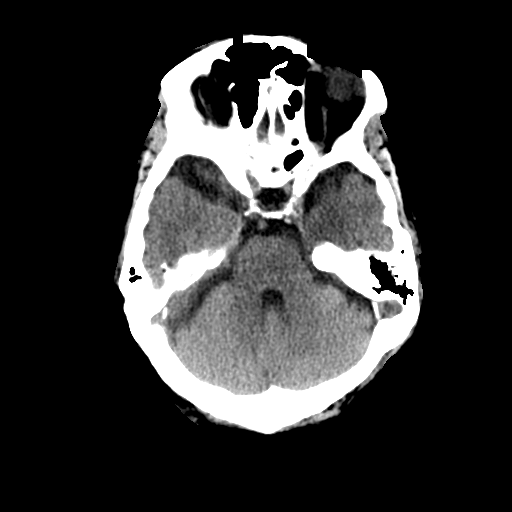
[im 12/33  brain]
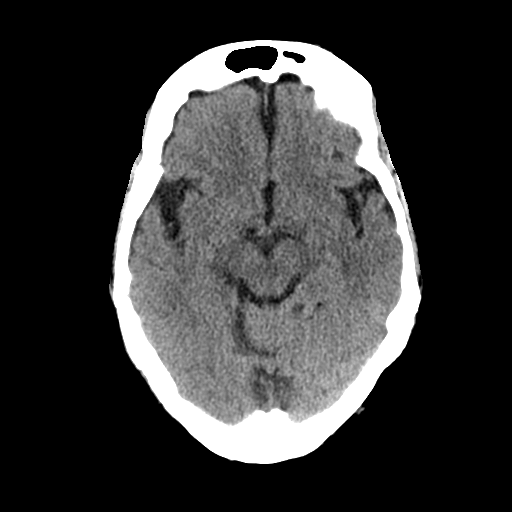
[im 15/33  brain]
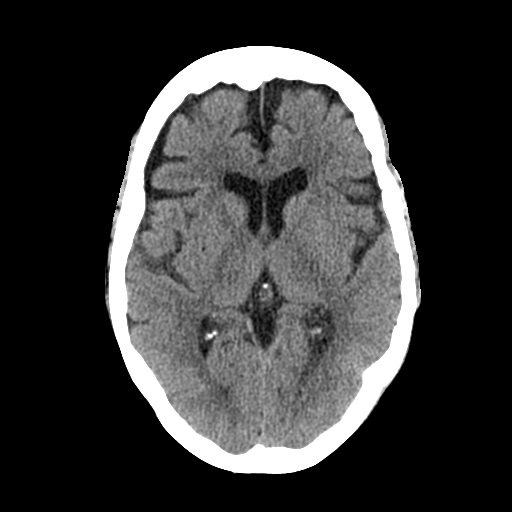
[im 15/33  bone]
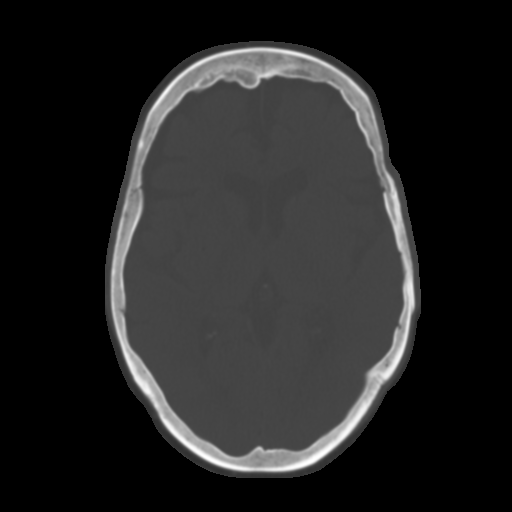
[im 18/33  brain]
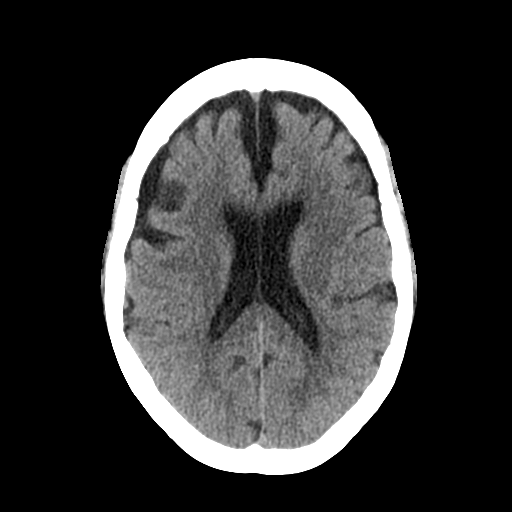
[im 21/33  brain]
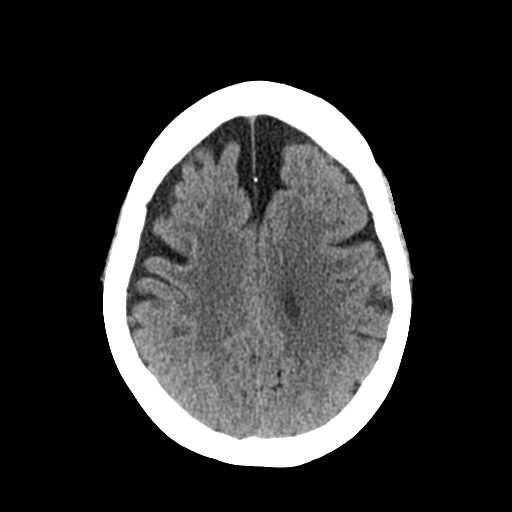
[im 25/33  brain]
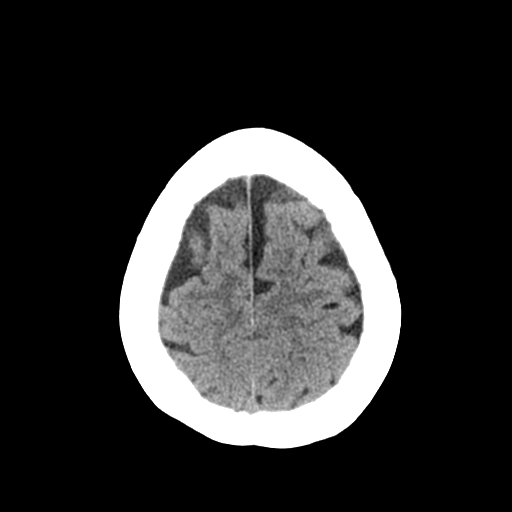
[im 27/33  brain]
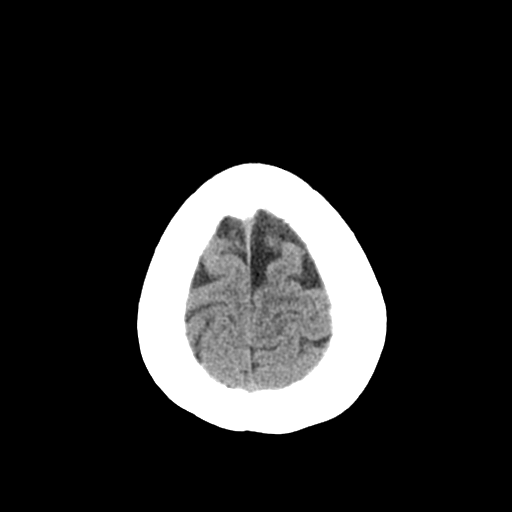
[im 27/33  bone]
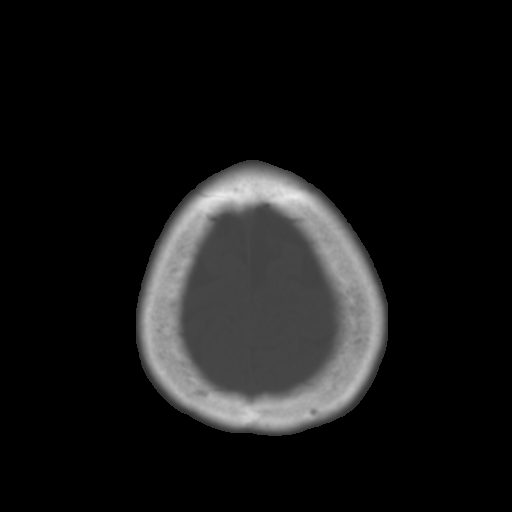
[im 30/33  brain]
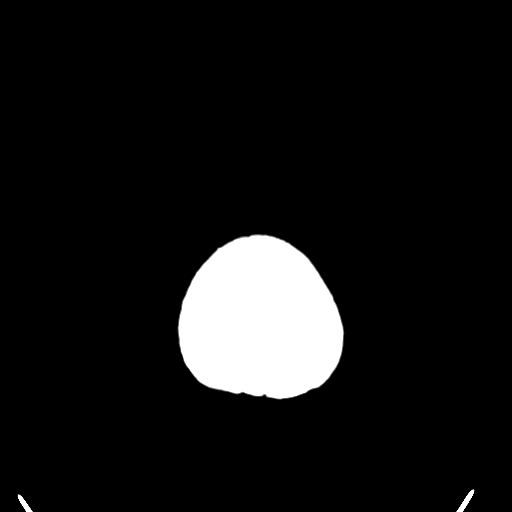

[Series 4: coronal soft tissue · coronal · 0.34mm/px · 3 of 72 slices shown]
[im 24/72  brain]
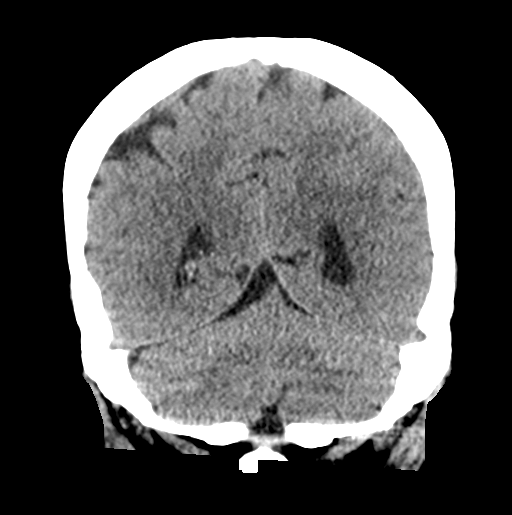
[im 32/72  brain]
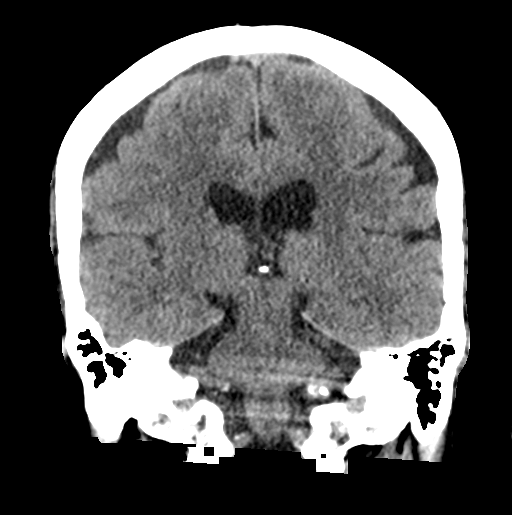
[im 40/72  brain]
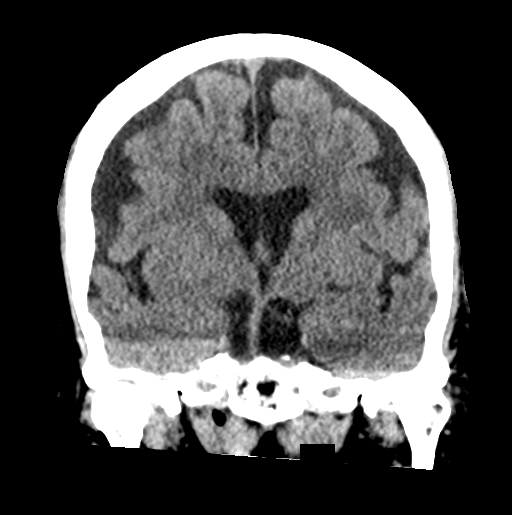

[Series 5: sagittal soft tissue · sagittal · 0.35mm/px · 3 of 57 slices shown]
[im 19/57  brain]
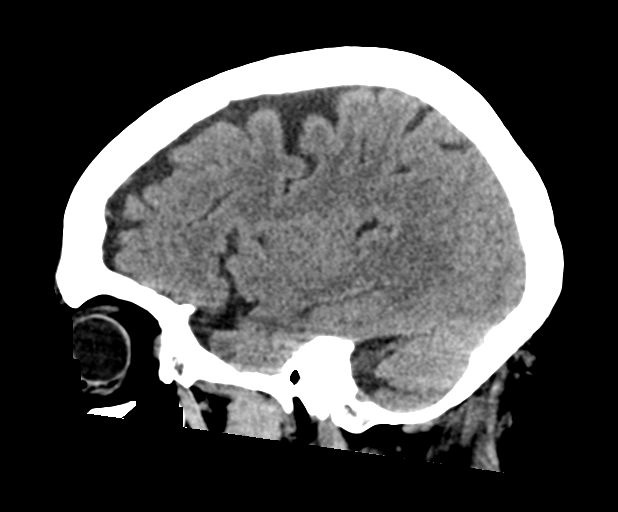
[im 29/57  brain]
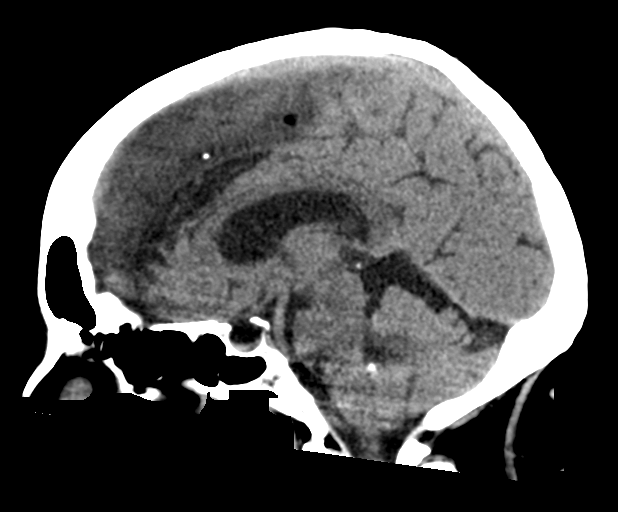
[im 38/57  brain]
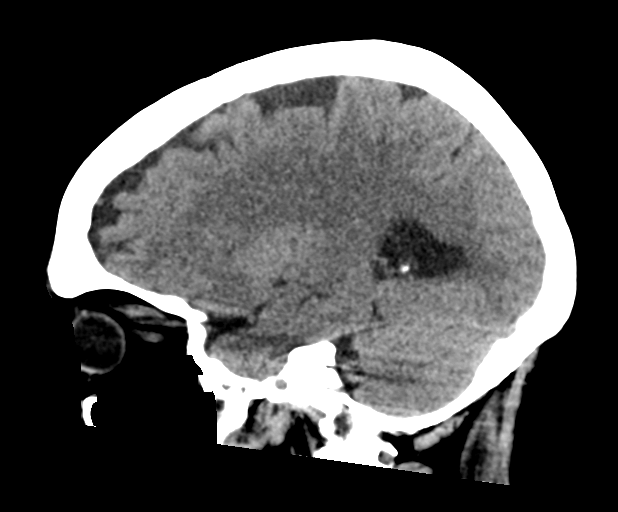

[16 of 47 positions shown; findings below may reference images not displayed]

FINDINGS: Brain: Age related atrophy, frontal lobe predominance. No sign of
old or acute focal infarction, mass lesion, hemorrhage,
hydrocephalus or extra-axial collection.

Vascular: There is atherosclerotic calcification of the major
vessels at the base of the brain.

Skull: Negative

Sinuses/Orbits: Clear. Previous functional endoscopic sinus surgery.
Orbits negative.

Other: None
IMPRESSION: No acute finding.  Atrophy with frontal lobe predominance.

## 2022-02-22 ENCOUNTER — Ambulatory Visit
Admission: RE | Admit: 2022-02-22 | Discharge: 2022-02-22 | Disposition: A | Discharge status: Home / Self Care | Payer: Medicare HMO | Attending: Internal Medicine | Admitting: Internal Medicine

## 2022-02-22 ENCOUNTER — Ambulatory Visit
Admission: RE | Admit: 2022-02-22 | Discharge: 2022-02-22 | Disposition: A | Discharge status: Home / Self Care | Payer: Medicare HMO | Source: Ambulatory Visit | Attending: Internal Medicine | Admitting: Internal Medicine

## 2022-02-22 DIAGNOSIS — R197 Diarrhea, unspecified: Secondary | ICD-10-CM

## 2022-04-07 ENCOUNTER — Other Ambulatory Visit: Payer: Self-pay | Admitting: Orthopedic Surgery

## 2022-04-07 DIAGNOSIS — Z96641 Presence of right artificial hip joint: Secondary | ICD-10-CM

## 2022-04-14 ENCOUNTER — Encounter (HOSPITAL_COMMUNITY): Payer: Self-pay

## 2022-04-14 ENCOUNTER — Emergency Department (HOSPITAL_COMMUNITY)
Admission: EM | Admit: 2022-04-14 | Discharge: 2022-04-14 | Discharge status: Against Medical Advice | Payer: Medicare HMO | Attending: Student | Admitting: Student

## 2022-04-14 ENCOUNTER — Other Ambulatory Visit: Payer: Self-pay

## 2022-04-14 DIAGNOSIS — R112 Nausea with vomiting, unspecified: Secondary | ICD-10-CM | POA: Insufficient documentation

## 2022-04-14 DIAGNOSIS — Z7901 Long term (current) use of anticoagulants: Secondary | ICD-10-CM | POA: Insufficient documentation

## 2022-04-14 DIAGNOSIS — Z5321 Procedure and treatment not carried out due to patient leaving prior to being seen by health care provider: Secondary | ICD-10-CM | POA: Insufficient documentation

## 2022-04-14 DIAGNOSIS — R42 Dizziness and giddiness: Secondary | ICD-10-CM | POA: Insufficient documentation

## 2022-04-14 DIAGNOSIS — R5383 Other fatigue: Secondary | ICD-10-CM | POA: Insufficient documentation

## 2022-04-14 LAB — CBC WITH DIFFERENTIAL/PLATELET
Abs Immature Granulocytes: 0.07 10*3/uL (ref 0.00–0.07)
Basophils Absolute: 0 10*3/uL (ref 0.0–0.1)
Basophils Relative: 0 %
Eosinophils Absolute: 0.1 10*3/uL (ref 0.0–0.5)
Eosinophils Relative: 1 %
HCT: 36.5 % (ref 36.0–46.0)
Hemoglobin: 11.4 g/dL — ABNORMAL LOW (ref 12.0–15.0)
Immature Granulocytes: 1 %
Lymphocytes Relative: 12 %
Lymphs Abs: 1.2 10*3/uL (ref 0.7–4.0)
MCH: 28.6 pg (ref 26.0–34.0)
MCHC: 31.2 g/dL (ref 30.0–36.0)
MCV: 91.7 fL (ref 80.0–100.0)
Monocytes Absolute: 1 10*3/uL (ref 0.1–1.0)
Monocytes Relative: 10 %
Neutro Abs: 7.9 10*3/uL — ABNORMAL HIGH (ref 1.7–7.7)
Neutrophils Relative %: 76 %
Platelets: 267 10*3/uL (ref 150–400)
RBC: 3.98 MIL/uL (ref 3.87–5.11)
RDW: 17.1 % — ABNORMAL HIGH (ref 11.5–15.5)
WBC: 10.4 10*3/uL (ref 4.0–10.5)
nRBC: 0 % (ref 0.0–0.2)

## 2022-04-14 LAB — COMPREHENSIVE METABOLIC PANEL
ALT: 15 U/L (ref 0–44)
AST: 12 U/L — ABNORMAL LOW (ref 15–41)
Albumin: 3.1 g/dL — ABNORMAL LOW (ref 3.5–5.0)
Alkaline Phosphatase: 56 U/L (ref 38–126)
Anion gap: 9 (ref 5–15)
BUN: 36 mg/dL — ABNORMAL HIGH (ref 8–23)
CO2: 25 mmol/L (ref 22–32)
Calcium: 8.4 mg/dL — ABNORMAL LOW (ref 8.9–10.3)
Chloride: 102 mmol/L (ref 98–111)
Creatinine, Ser: 0.73 mg/dL (ref 0.44–1.00)
GFR, Estimated: >60 mL/min (ref >60)
Glucose, Bld: 104 mg/dL — ABNORMAL HIGH (ref 70–99)
Potassium: 4 mmol/L (ref 3.5–5.1)
Sodium: 136 mmol/L (ref 135–145)
Total Bilirubin: 0.5 mg/dL (ref 0.3–1.2)
Total Protein: 5.8 g/dL — ABNORMAL LOW (ref 6.5–8.1)

## 2022-04-14 LAB — I-STAT CHEM 8, ED
BUN: 38 mg/dL — ABNORMAL HIGH (ref 8–23)
Calcium, Ion: 1.08 mmol/L — ABNORMAL LOW (ref 1.15–1.40)
Chloride: 101 mmol/L (ref 98–111)
Creatinine, Ser: 0.6 mg/dL (ref 0.44–1.00)
Glucose, Bld: 100 mg/dL — ABNORMAL HIGH (ref 70–99)
HCT: 36 % (ref 36.0–46.0)
Hemoglobin: 12.2 g/dL (ref 12.0–15.0)
Potassium: 4 mmol/L (ref 3.5–5.1)
Sodium: 136 mmol/L (ref 135–145)
TCO2: 25 mmol/L (ref 22–32)

## 2022-04-14 LAB — TYPE AND SCREEN
ABO/RH(D): A POS
Antibody Screen: NEGATIVE

## 2022-04-14 LAB — PROTIME-INR
INR: 1.3 — ABNORMAL HIGH (ref 0.8–1.2)
Prothrombin Time: 15.9 seconds — ABNORMAL HIGH (ref 11.4–15.2)

## 2022-04-14 LAB — LIPASE, BLOOD: Lipase: 25 U/L (ref 11–51)

## 2022-04-14 MED ORDER — PANTOPRAZOLE SODIUM 40 MG IV SOLR
40.0000 mg | Freq: Once | INTRAVENOUS | Status: AC
  Administered 2022-04-14: 40 mg via INTRAVENOUS
  Filled 2022-04-14: qty 10

## 2022-04-14 NOTE — ED Triage Notes (Signed)
Arrives EMS from home with vomiting x 5 hours. EMS reports visualizing dark coffee ground emesis and diarrhea. Taking Eliquis.

## 2022-04-14 NOTE — ED Notes (Signed)
Pt called x3 for room, no answer.

## 2022-04-14 NOTE — ED Provider Triage Note (Signed)
Emergency Medicine Provider Triage Evaluation Note  Stacy Allen , a 81 y.o. female  was evaluated in triage.  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  Review of Systems  Positive: Nausea vomiting Negative: Chest pain shortness of breath  Physical Exam  BP (!) 177/91 (BP Location: Right Arm)   Pulse 72   Temp (!) 97.5 F (36.4 C) (Oral)   Resp 16   Ht '5\' 3"'$  (1.6 m)   Wt 117.9 kg   SpO2 98%   BMI 46.06 kg/m  Gen:   Awake, no distress   Resp:  Normal effort  MSK:   Moves extremities without difficulty  Other:    Medical Decision Making  Medically screening exam initiated at 3:36 AM.  Appropriate orders placed.  KIRIN BRANDENBURGER was informed that the remainder of the evaluation will be completed by another provider, this initial triage assessment does not replace that evaluation, and the importance of remaining in the ED until their evaluation is complete.  Lab work imaging been ordered will need further work-up.   Marcello Fennel, PA-C 04/14/22 [phone]

## 2022-04-15 ENCOUNTER — Ambulatory Visit
Admission: RE | Admit: 2022-04-15 | Discharge: 2022-04-15 | Disposition: A | Discharge status: Home / Self Care | Payer: Medicare HMO | Source: Ambulatory Visit | Attending: Nurse Practitioner | Admitting: Nurse Practitioner

## 2022-04-15 DIAGNOSIS — Z780 Asymptomatic menopausal state: Secondary | ICD-10-CM | POA: Diagnosis present

## 2022-04-19 ENCOUNTER — Encounter
Admission: RE | Admit: 2022-04-19 | Discharge: 2022-04-19 | Disposition: A | Discharge status: Home / Self Care | Payer: Medicare HMO | Source: Ambulatory Visit | Attending: Orthopedic Surgery | Admitting: Orthopedic Surgery

## 2022-04-19 DIAGNOSIS — Z96641 Presence of right artificial hip joint: Secondary | ICD-10-CM | POA: Diagnosis present

## 2022-04-19 MED ORDER — TECHNETIUM TC 99M MEDRONATE IV KIT
20.0000 | PACK | Freq: Once | INTRAVENOUS | Status: AC | PRN
  Administered 2022-04-19: 21.93 via INTRAVENOUS

## 2022-05-03 ENCOUNTER — Telehealth: Payer: Self-pay

## 2022-05-03 NOTE — Telephone Encounter (Signed)
(  8:39 am) PC SW telephoned patient's PCG/niece-[name] to discuss patient status and program changes. [name] advised that patient's condition was stable right now. Patient still driving, and is very independent. SW explained that palliative care will sign off on patient's care at this time due to her stable status. SW encouraged her to call back with changes in patient's condition/decline for support. She verbalized understanding and no other concerns were noted at this time.   Palliative care will sign off effective this day, 05/03/22.

## 2022-09-20 ENCOUNTER — Ambulatory Visit (INDEPENDENT_AMBULATORY_CARE_PROVIDER_SITE_OTHER): Payer: Medicare HMO | Admitting: Clinical

## 2022-09-20 DIAGNOSIS — F419 Anxiety disorder, unspecified: Secondary | ICD-10-CM | POA: Diagnosis not present

## 2022-09-20 DIAGNOSIS — F330 Major depressive disorder, recurrent, mild: Secondary | ICD-10-CM

## 2022-09-20 NOTE — Progress Notes (Signed)
                Keeley Sussman, LCSW 
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
## 2022-10-06 ENCOUNTER — Other Ambulatory Visit: Payer: Self-pay | Admitting: Nurse Practitioner

## 2022-10-06 DIAGNOSIS — M25512 Pain in left shoulder: Secondary | ICD-10-CM

## 2022-10-13 ENCOUNTER — Ambulatory Visit: Payer: Medicare HMO

## 2022-11-02 ENCOUNTER — Ambulatory Visit: Payer: Medicare HMO | Admitting: Clinical

## 2022-11-02 DIAGNOSIS — F330 Major depressive disorder, recurrent, mild: Secondary | ICD-10-CM | POA: Diagnosis not present

## 2022-11-02 DIAGNOSIS — F419 Anxiety disorder, unspecified: Secondary | ICD-10-CM | POA: Diagnosis not present
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
## 2022-11-27 ENCOUNTER — Emergency Department
Admission: EM | Admit: 2022-11-27 | Discharge: 2022-11-27 | Disposition: A | Discharge status: Home / Self Care | Payer: Medicare HMO | Attending: Emergency Medicine | Admitting: Emergency Medicine

## 2022-11-27 ENCOUNTER — Other Ambulatory Visit: Payer: Self-pay

## 2022-11-27 DIAGNOSIS — K920 Hematemesis: Secondary | ICD-10-CM | POA: Diagnosis not present

## 2022-11-27 DIAGNOSIS — R112 Nausea with vomiting, unspecified: Secondary | ICD-10-CM | POA: Diagnosis present

## 2022-11-27 DIAGNOSIS — Z7901 Long term (current) use of anticoagulants: Secondary | ICD-10-CM | POA: Diagnosis not present

## 2022-11-27 DIAGNOSIS — I2510 Atherosclerotic heart disease of native coronary artery without angina pectoris: Secondary | ICD-10-CM | POA: Diagnosis not present

## 2022-11-27 DIAGNOSIS — I10 Essential (primary) hypertension: Secondary | ICD-10-CM | POA: Diagnosis not present

## 2022-11-27 LAB — URINALYSIS, ROUTINE W REFLEX MICROSCOPIC
Bilirubin Urine: NEGATIVE
Glucose, UA: NEGATIVE mg/dL
Hgb urine dipstick: NEGATIVE
Ketones, ur: NEGATIVE mg/dL
Leukocytes,Ua: NEGATIVE
Nitrite: NEGATIVE
Protein, ur: NEGATIVE mg/dL
Specific Gravity, Urine: 1.016 (ref 1.005–1.030)
pH: 7 (ref 5.0–8.0)

## 2022-11-27 LAB — COMPREHENSIVE METABOLIC PANEL
ALT: 10 U/L (ref 0–44)
AST: 15 U/L (ref 15–41)
Albumin: 3.7 g/dL (ref 3.5–5.0)
Alkaline Phosphatase: 76 U/L (ref 38–126)
Anion gap: 7 (ref 5–15)
BUN: 17 mg/dL (ref 8–23)
CO2: 28 mmol/L (ref 22–32)
Calcium: 8.4 mg/dL — ABNORMAL LOW (ref 8.9–10.3)
Chloride: 100 mmol/L (ref 98–111)
Creatinine, Ser: 0.68 mg/dL (ref 0.44–1.00)
GFR, Estimated: >60 mL/min (ref >60)
Glucose, Bld: 109 mg/dL — ABNORMAL HIGH (ref 70–99)
Potassium: 4 mmol/L (ref 3.5–5.1)
Sodium: 135 mmol/L (ref 135–145)
Total Bilirubin: 0.8 mg/dL (ref 0.3–1.2)
Total Protein: 7.2 g/dL (ref 6.5–8.1)

## 2022-11-27 LAB — CBC
HCT: 33 % — ABNORMAL LOW (ref 36.0–46.0)
Hemoglobin: 9.9 g/dL — ABNORMAL LOW (ref 12.0–15.0)
MCH: 25.8 pg — ABNORMAL LOW (ref 26.0–34.0)
MCHC: 30 g/dL (ref 30.0–36.0)
MCV: 85.9 fL (ref 80.0–100.0)
Platelets: 295 10*3/uL (ref 150–400)
RBC: 3.84 MIL/uL — ABNORMAL LOW (ref 3.87–5.11)
RDW: 16.3 % — ABNORMAL HIGH (ref 11.5–15.5)
WBC: 5.5 10*3/uL (ref 4.0–10.5)
nRBC: 0 % (ref 0.0–0.2)

## 2022-11-27 LAB — LIPASE, BLOOD: Lipase: 24 U/L (ref 11–51)

## 2022-11-27 MED ORDER — ONDANSETRON HCL 4 MG/2ML IJ SOLN
4.0000 mg | Freq: Once | INTRAMUSCULAR | Status: AC
  Administered 2022-11-27: 4 mg via INTRAVENOUS
  Filled 2022-11-27: qty 2

## 2022-11-27 MED ORDER — PANTOPRAZOLE SODIUM 40 MG PO TBEC: 40.0000 mg | DELAYED_RELEASE_TABLET | Freq: Two times a day (BID) | ORAL | 0 refills | Status: DC

## 2022-11-27 MED ORDER — PANTOPRAZOLE 80MG IVPB - SIMPLE MED
80.0000 mg | Freq: Once | INTRAVENOUS | Status: AC
  Administered 2022-11-27: 80 mg via INTRAVENOUS
  Filled 2022-11-27: qty 100

## 2022-11-27 MED ORDER — SODIUM CHLORIDE 0.9 % IV BOLUS
500.0000 mL | Freq: Once | INTRAVENOUS | Status: AC
  Administered 2022-11-27: 500 mL via INTRAVENOUS

## 2022-11-27 MED ORDER — ONDANSETRON 4 MG PO TBDP: 4.0000 mg | ORAL_TABLET | Freq: Three times a day (TID) | ORAL | 0 refills | Status: DC | PRN

## 2022-11-27 NOTE — ED Provider Notes (Signed)
[facility] Provider Note    Event Date/Time   First MD Initiated Contact with Patient 11/27/22 1104     (approximate)   History   Abdominal Pain   HPI  Stacy Allen is a 82 y.o. female past medical history significant for CAD, hypertension, anticoagulation use, who presents to the emergency department with concern for an upper GI bleed.  Patient states yesterday started having burning in her upper abdominal area that was ongoing.  Today had an episode of nausea and then an episode of vomiting where she was concerned was blood.  States that it was dark brown and black and has had a history of a GI bleed in the past and was told that it looks similar.  Denies any ongoing nausea or vomiting at this time.  No further episodes of hematemesis.  Denies any melena.  No recent endoscopy or colonoscopy.  Does take anticoagulation but denies any daily NSAID use.  Ongoing mild burning abdominal pain at this time.  Denies any significant alcohol use.     Physical Exam   Triage Vital Signs: ED Triage Vitals  Enc Vitals Group     BP 11/27/22 0946 (!) 155/92     Pulse Rate 11/27/22 0946 73     Resp 11/27/22 0946 18     Temp 11/27/22 0946 98 F (36.7 C)     Temp Source 11/27/22 0946 Oral     SpO2 11/27/22 0946 95 %     Weight 11/27/22 0945 260 lb (117.9 kg)     Height 11/27/22 0945 5\' 3"  (1.6 m)     Head Circumference --      Peak Flow --      Pain Score 11/27/22 0945 6     Pain Loc --      Pain Edu? --      Excl. in GC? --     Most recent vital signs: Vitals:   11/27/22 0946 11/27/22 1450  BP: (!) 155/92 (!) 179/94  Pulse: 73 69  Resp: 18 20  Temp: 98 F (36.7 C) 98.7 F (37.1 C)  SpO2: 95% 99%    Physical Exam Constitutional:      Appearance: She is well-developed.  HENT:     Head: Atraumatic.  Eyes:     Conjunctiva/sclera: Conjunctivae normal.  Cardiovascular:     Rate and Rhythm: Regular rhythm.  Pulmonary:     Effort: No respiratory  distress.  Abdominal:     General: There is no distension.     Tenderness: There is abdominal tenderness in the epigastric area.  Genitourinary:    Comments: DRE with no gross blood or melena Musculoskeletal:        General: Normal range of motion.     Cervical back: Normal range of motion.  Skin:    General: Skin is warm.  Neurological:     Mental Status: She is alert. Mental status is at baseline.     IMPRESSION / MDM / ASSESSMENT AND PLAN / ED COURSE  I reviewed the triage vital signs and the nursing notes.  Differential diagnosis including gastritis/PUD, upper GI bleed EKG  I, Corena Herter, the attending physician, personally viewed and interpreted this ECG.   Rate: Normal  Rhythm: Normal sinus  Axis: Normal  Intervals: Normal  ST&T Change: None  No tachycardic or bradycardic dysrhythmias while on cardiac telemetry.   LABS (all labs ordered are listed, but only abnormal results are displayed) Labs interpreted as -  Labs Reviewed  COMPREHENSIVE METABOLIC PANEL - Abnormal; Notable for the following components:      Result Value   Glucose, Bld 109 (*)    Calcium 8.4 (*)    All other components within normal limits  CBC - Abnormal; Notable for the following components:   RBC 3.84 (*)    Hemoglobin 9.9 (*)    HCT 33.0 (*)    MCH 25.8 (*)    RDW 16.3 (*)    All other components within normal limits  URINALYSIS, ROUTINE W REFLEX MICROSCOPIC - Abnormal; Notable for the following components:   Color, Urine YELLOW (*)    APPearance HAZY (*)    All other components within normal limits  LIPASE, BLOOD     MDM  Patient is found to be anemic at 9.9 but baseline hemoglobin in the 10s.  No significantly elevated BUN.  Patient was given IV Zofran and Protonix.  Rectal exam with no gross blood or melena.  No concern for variceal bleed.  Patient is on anticoagulation but no further episodes of hematemesis.  Called and discussed the patient's case with gastroenterology  Dr. Norma Fredrickson who recommended outpatient follow-up and to call his office to schedule outpatient follow-up and possible endoscopy.  Patient is on omeprazole, discussed increasing to 40 mg twice daily.  Given a prescription for antiemetics.  Clinical picture is not consistent with acute cholecystitis, no concern for perforated bowel.  No significant lower abdominal tenderness to palpation and no rebound or guarding.  Do not feel that CT imaging is necessary at this time.  Discharge home with close follow-up with primary care provider to recheck hemoglobin.  Given return precautions for any worsening symptoms.     PROCEDURES:  Critical Care performed: No  Procedures  [name] presentation is most consistent with acute presentation with potential threat to life or bodily function.   MEDICATIONS ORDERED IN ED: Medications  pantoprazole (PROTONIX) 80 mg /NS 100 mL IVPB (0 mg Intravenous Stopped 11/27/22 1528)  ondansetron (ZOFRAN) injection 4 mg (4 mg Intravenous Given 11/27/22 1255)  sodium chloride 0.9 % bolus 500 mL (0 mLs Intravenous Stopped 11/27/22 1528)    FINAL CLINICAL IMPRESSION(S) / ED DIAGNOSES   Final diagnoses:  Nausea and vomiting, unspecified vomiting type  Hematemesis with nausea     Rx / DC Orders   ED Discharge Orders          Ordered    pantoprazole (PROTONIX) 40 MG tablet  2 times daily        11/27/22 1401    ondansetron (ZOFRAN-ODT) 4 MG disintegrating tablet  Every 8 hours PRN        11/27/22 1401             Note:  This document was prepared using Dragon voice recognition software and may include unintentional dictation errors.   Corena Herter, MD 11/27/22 1531

## 2022-11-27 NOTE — ED Triage Notes (Signed)
Pt via POV from home. Pt c/o generalized abd pain starting a week ago, states that last night she started having epigastric pain and nausea. Pt has a hx of hiatal hernia. Pt is A&Ox4 and NAD

## 2022-11-27 NOTE — ED Notes (Signed)
Rectal exam performed by Dr Arnoldo Morale with this RN at bedside to assist.

## 2022-11-27 NOTE — Discharge Instructions (Signed)
You are seen in the emergency department for vomiting.  Your hemoglobin level today was 9.9.  You are given a dose of IV Protonix.  After discussion with gastroenterology Dr. Norma Fredrickson recommended that you call the office first thing in the morning and let them know you are seen in the emergency department and that we talk to Dr. Norma Fredrickson and wanted you to follow-up closely as an outpatient for likely endoscopy.  Call your primary care physician in the morning to schedule close follow-up appointment so they can repeat your hemoglobin level.  You were started on an acid reducing medicine called Protonix that you should take twice a day.  You are given a prescription for nausea medication.  If you have worsening bleeding it is important they return to the emergency department immediately.

## 2022-12-01 ENCOUNTER — Ambulatory Visit: Payer: Medicare HMO | Admitting: Clinical

## 2022-12-01 DIAGNOSIS — F330 Major depressive disorder, recurrent, mild: Secondary | ICD-10-CM | POA: Diagnosis not present

## 2022-12-01 DIAGNOSIS — F419 Anxiety disorder, unspecified: Secondary | ICD-10-CM

## 2022-12-01 NOTE — Progress Notes (Signed)
[facility] Counselor/Therapist Progress Note  Patient ID: Stacy Allen, MRN: [id number]    Date: 12/01/22  Time Spent: 9:38  am - 10:36 am : 58 Minutes  Treatment Type: Individual Therapy.  Reported Symptoms: Patient reported crying recently  Mental Status Exam: Appearance:  Neat and Well Groomed     Behavior: Appropriate  Motor: Normal  Speech/Language:  Clear and Coherent  Affect: Tearful  Mood: sad  Thought process: normal  Thought content:   WNL  Sensory/Perceptual disturbances:   WNL  Orientation: oriented to person, place, and situation  Attention: Good  Concentration: Good  Memory: WNL  Fund of knowledge:  Good  Insight:   Fair  Judgment:  Good  Impulse Control: Good   Risk Assessment: Danger to Self:  No Patient denied current suicidal ideation  Self-injurious Behavior: No Danger to Others: No Patient denied current homicidal ideation Duty to Warn:no Physical Aggression / Violence:No  Access to Firearms a concern: No  Gang Involvement:No   Subjective:  Patient inquired about the status of a referral to a psychiatrist. Patient stated, "[place]" in response to events since last session.  Patient reported she is experiencing issues related to a hiatal hernia and was seen at the emergency room on Sunday due to vomiting blood. Patient reported there are concerns patient may have a bleeding ulcer. Patient reported she completed a stress test yesterday. Patient stated "I've been wanting to cry" since having the stress test. Patient reported she prefers to have a family member with her during medical appointments. Patent stated, "Its like I'm soloing" and stated, "it just brings back that I don't want to keep going through everything alone". Patient identified her nephew, nephew's wife, patient's sister, patient's brother, and great nieces as supports.  Patient stated, "I guess I want to see my being able to recognize and  appreciate what I have instead of focusing on all I've lost".   Interventions: Motivational Interviewing. Clinician conducted session in person at clinician's office at [facility]. Discussed referral to psychiatrist, consent for referral, and patient requested consent be sent via postal mail. Reviewed events since last session and patient's recent medical concerns. Explored patient's support system. Clinician utilized motivational interviewing to explore potential goals for therapy. Clinician utilized a task centered approach in collaboration with patient to begin to develop goals for therapy. Goals to be finalized during follow up appointment.   Collaboration of Care: Other discussed consent for referral to a psychiatrist and patient requested consent be sent to patient via postal mail    Diagnosis:  Mild episode of recurrent major depressive disorder (HCC)  Anxiety disorder, unspecified type   Plan: Patient is to utilize [employer] [facility], thought re-framing, mindfulness and coping strategies to decrease symptoms associated with their diagnosis. Frequency: bi-weekly  Modality: individual     Long-term goal:   Patient stated, "I guess I want to see my being able to recognize and appreciate what I have instead of focusing on all I've lost".  Target Date: 12/01/23  Progress: 0   Short-term goal:  Write at least one positive affirmation about herself and/or patient's environment each day Target Date: 06/02/23  Progress: 0   Practice gratitude exercises to decrease symptoms of depression Target Date: 06/02/23  Progress: 0   Identify, challenge, and replace negative thought patterns that contribute to feelings of depression and anxiety with positive thoughts and beliefs per patient's report Target Date: 06/02/23  Progress: 0  Develop and implement strategies to increase self care, such as, participating in water exercise class, accepting invitations to social  functions  Target Date: 06/02/23  Progress: 0                       Doree Barthel, LCSW

## 2022-12-07 ENCOUNTER — Encounter: Payer: Self-pay | Admitting: Gastroenterology

## 2022-12-08 ENCOUNTER — Ambulatory Visit: Payer: Medicare HMO | Admitting: Certified Registered"

## 2022-12-08 ENCOUNTER — Ambulatory Visit
Admission: RE | Admit: 2022-12-08 | Discharge: 2022-12-08 | Disposition: A | Discharge status: Home / Self Care | Payer: Medicare HMO | Attending: Gastroenterology | Admitting: Gastroenterology

## 2022-12-08 ENCOUNTER — Other Ambulatory Visit: Payer: Self-pay

## 2022-12-08 ENCOUNTER — Encounter: Payer: Self-pay | Admitting: Gastroenterology

## 2022-12-08 ENCOUNTER — Encounter
Admission: RE | Disposition: A | Discharge status: Home / Self Care | Payer: Self-pay | Source: Home / Self Care | Attending: Gastroenterology

## 2022-12-08 DIAGNOSIS — R112 Nausea with vomiting, unspecified: Secondary | ICD-10-CM | POA: Insufficient documentation

## 2022-12-08 DIAGNOSIS — K449 Diaphragmatic hernia without obstruction or gangrene: Secondary | ICD-10-CM | POA: Diagnosis not present

## 2022-12-08 DIAGNOSIS — K2950 Unspecified chronic gastritis without bleeding: Secondary | ICD-10-CM | POA: Diagnosis not present

## 2022-12-08 DIAGNOSIS — I110 Hypertensive heart disease with heart failure: Secondary | ICD-10-CM | POA: Insufficient documentation

## 2022-12-08 DIAGNOSIS — Z6841 Body Mass Index (BMI) 40.0 and over, adult: Secondary | ICD-10-CM | POA: Insufficient documentation

## 2022-12-08 DIAGNOSIS — Z791 Long term (current) use of non-steroidal anti-inflammatories (NSAID): Secondary | ICD-10-CM | POA: Insufficient documentation

## 2022-12-08 DIAGNOSIS — K2980 Duodenitis without bleeding: Secondary | ICD-10-CM | POA: Insufficient documentation

## 2022-12-08 DIAGNOSIS — K219 Gastro-esophageal reflux disease without esophagitis: Secondary | ICD-10-CM | POA: Insufficient documentation

## 2022-12-08 DIAGNOSIS — I25119 Atherosclerotic heart disease of native coronary artery with unspecified angina pectoris: Secondary | ICD-10-CM | POA: Diagnosis not present

## 2022-12-08 DIAGNOSIS — Z79899 Other long term (current) drug therapy: Secondary | ICD-10-CM | POA: Diagnosis not present

## 2022-12-08 DIAGNOSIS — I509 Heart failure, unspecified: Secondary | ICD-10-CM | POA: Diagnosis not present

## 2022-12-08 DIAGNOSIS — R1013 Epigastric pain: Secondary | ICD-10-CM | POA: Insufficient documentation

## 2022-12-08 DIAGNOSIS — Z7901 Long term (current) use of anticoagulants: Secondary | ICD-10-CM | POA: Diagnosis not present

## 2022-12-08 DIAGNOSIS — F32A Depression, unspecified: Secondary | ICD-10-CM | POA: Insufficient documentation

## 2022-12-08 DIAGNOSIS — G4730 Sleep apnea, unspecified: Secondary | ICD-10-CM | POA: Diagnosis not present

## 2022-12-08 DIAGNOSIS — K317 Polyp of stomach and duodenum: Secondary | ICD-10-CM | POA: Diagnosis not present

## 2022-12-08 DIAGNOSIS — E039 Hypothyroidism, unspecified: Secondary | ICD-10-CM | POA: Insufficient documentation

## 2022-12-08 HISTORY — PX: ESOPHAGOGASTRODUODENOSCOPY (EGD) WITH PROPOFOL: SHX5813

## 2022-12-08 SURGERY — ESOPHAGOGASTRODUODENOSCOPY (EGD) WITH PROPOFOL
Anesthesia: General

## 2022-12-08 MED ORDER — SODIUM CHLORIDE 0.9 % IV SOLN: INTRAVENOUS | Status: DC

## 2022-12-08 MED ORDER — PROPOFOL 500 MG/50ML IV EMUL
INTRAVENOUS | Status: DC | PRN
  Administered 2022-12-08: 145 ug/kg/min via INTRAVENOUS

## 2022-12-08 MED ORDER — GLYCOPYRROLATE 0.2 MG/ML IJ SOLN
INTRAMUSCULAR | Status: DC | PRN
  Administered 2022-12-08: .2 mg via INTRAVENOUS

## 2022-12-08 MED ORDER — LIDOCAINE HCL (CARDIAC) PF 100 MG/5ML IV SOSY
PREFILLED_SYRINGE | INTRAVENOUS | Status: DC | PRN
  Administered 2022-12-08: 100 mg via INTRAVENOUS

## 2022-12-08 MED ORDER — PROPOFOL 10 MG/ML IV BOLUS
INTRAVENOUS | Status: DC | PRN
  Administered 2022-12-08: 50 mg via INTRAVENOUS

## 2022-12-08 NOTE — Anesthesia Procedure Notes (Signed)
Procedure Name: General with mask airway Date/Time: 12/08/2022 12:25 PM  Performed by: Mohammed Kindle, CRNAPre-anesthesia Checklist: Patient identified, Emergency Drugs available, Suction available and Patient being monitored Patient Re-evaluated:Patient Re-evaluated prior to induction Oxygen Delivery Method: Simple face mask Induction Type: IV induction Placement Confirmation: positive ETCO2, CO2 detector and breath sounds checked- equal and bilateral Dental Injury: Teeth and Oropharynx as per pre-operative assessment

## 2022-12-08 NOTE — Anesthesia Postprocedure Evaluation (Signed)
Anesthesia Post Note  Patient: Stacy Allen  Procedure(s) Performed: ESOPHAGOGASTRODUODENOSCOPY (EGD) WITH PROPOFOL  Patient location during evaluation: PACU Anesthesia Type: General Level of consciousness: awake and awake and alert Pain management: pain level controlled Vital Signs Assessment: post-procedure vital signs reviewed and stable Respiratory status: spontaneous breathing and nonlabored ventilation Cardiovascular status: stable Anesthetic complications: no   No notable events documented.   Last Vitals:  Vitals:   12/08/22 1233 12/08/22 1236  BP: (!) 172/90 (!) 172/90  Pulse: 73 71  Resp: (!) 25 18  Temp: (!) 36.2 C   SpO2: 100% 100%    Last Pain:  Vitals:   12/08/22 1236  TempSrc:   PainSc: 0-No pain                 VAN STAVEREN,Hena Ewalt

## 2022-12-08 NOTE — Op Note (Signed)
[facility] Gastroenterology Patient Name: Stacy Allen Procedure Date: 12/08/2022 11:31 AM MRN: [id number] Account #: [id number] Date of Birth: 12/09/1940 Admit Type: Outpatient Age: 82 Room: [facility] ENDO ROOM 1 Gender: Female Note Status: Finalized Instrument Name: Upper Endoscope [id number] Procedure:             Upper GI endoscopy Indications:           Epigastric abdominal pain, Nausea with vomiting Providers:             Jaynie Collins DO, DO Referring MD:          No Local Md, MD (Referring MD) Medicines:             Monitored Anesthesia Care Complications:         No immediate complications. Estimated blood loss:                         Minimal. Procedure:             Pre-Anesthesia Assessment:                        - Prior to the procedure, a History and Physical was                         performed, and patient medications and allergies were                         reviewed. The patient is competent. The risks and                         benefits of the procedure and the sedation options and                         risks were discussed with the patient. All questions                         were answered and informed consent was obtained.                         Patient identification and proposed procedure were                         verified by the physician, the nurse, the anesthetist                         and the technician in the endoscopy suite. Mental                         Status Examination: alert and oriented. Airway                         Examination: normal oropharyngeal airway and neck                         mobility. Respiratory Examination: clear to                         auscultation. CV Examination: RRR, no murmurs, no S3  or S4. Prophylactic Antibiotics: The patient does not                         require prophylactic antibiotics. Prior                         Anticoagulants: The patient has taken  Eliquis                         (apixaban), last dose was 3 days prior to procedure.                         ASA Grade Assessment: IV - A patient with severe                         systemic disease that is a constant threat to life.                         After reviewing the risks and benefits, the patient                         was deemed in satisfactory condition to undergo the                         procedure. The anesthesia plan was to use monitored                         anesthesia care (MAC). Immediately prior to                         administration of medications, the patient was                         re-assessed for adequacy to receive sedatives. The                         heart rate, respiratory rate, oxygen saturations,                         blood pressure, adequacy of pulmonary ventilation, and                         response to care were monitored throughout the                         procedure. The physical status of the patient was                         re-assessed after the procedure.                        After obtaining informed consent, the endoscope was                         passed under direct vision. Throughout the procedure,                         the patient's blood pressure, pulse, and oxygen  saturations were monitored continuously. The Endoscope                         was introduced through the mouth, and advanced to the                         second part of duodenum. The upper GI endoscopy was                         accomplished without difficulty. The patient tolerated                         the procedure well. Findings:      Localized mild inflammation characterized by erythema was found in the       duodenal bulb. Estimated blood loss: none.      The exam of the duodenum was otherwise normal.      A 10 cm hiatal hernia was present. No camerons erosions appreciated.       Estimated blood loss: none.      Multiple  less than 5 mm sessile polyps with no bleeding and no stigmata       of recent bleeding were found on the greater curvature of the stomach.       Estimated blood loss: none.      Normal mucosa was found in the entire examined stomach. Biopsies were       taken with a cold forceps for Helicobacter pylori testing. Estimated       blood loss was minimal.      The exam of the stomach was otherwise normal.      The Z-line was regular.      Esophagogastric landmarks were identified: the gastroesophageal junction       was found at 32 cm from the incisors.      The exam of the esophagus was otherwise normal. Impression:            - Duodenitis.                        - 10 cm hiatal hernia.                        - Multiple gastric polyps.                        - Normal mucosa was found in the entire stomach.                         Biopsied.                        - Z-line regular.                        - Esophagogastric landmarks identified. Recommendation:        - Patient has a contact number available for                         emergencies. The signs and symptoms of potential                         delayed complications were discussed  with the patient.                         Return to normal activities tomorrow. Written                         discharge instructions were provided to the patient.                        - Discharge patient to home.                        - Resume previous diet.                        - Continue present medications.                        - Await pathology results.                        - Resume Eliquis (apixaban) at prior dose tomorrow.                         Refer to managing physician for further adjustment of                         therapy.                        - Return to GI clinic PRN.                        - The findings and recommendations were discussed with                         the patient. Procedure Code(s):     --- Professional  ---                        [phone], Esophagogastroduodenoscopy, flexible,                         transoral; with biopsy, single or multiple Diagnosis Code(s):     --- Professional ---                        K29.80, Duodenitis without bleeding                        K44.9, Diaphragmatic hernia without obstruction or                         gangrene                        K31.7, Polyp of stomach and duodenum                        R10.13, Epigastric pain                        R11.2, Nausea with vomiting, unspecified CPT copyright 2022 American Medical Association. All rights reserved. The codes documented in this  report are preliminary and upon coder review may  be revised to meet current compliance requirements. Attending Participation:      I personally performed the entire procedure. Elfredia Nevins, DO Jaynie Collins DO, DO 12/08/2022 12:36:21 PM This report has been signed electronically. Number of Addenda: 0 Note Initiated On: 12/08/2022 11:31 AM Estimated Blood Loss:  Estimated blood loss was minimal.      [facility]

## 2022-12-08 NOTE — Interval H&P Note (Signed)
History and Physical Interval Note: Preprocedure H&P from 12/08/22  was reviewed and there was no interval change after seeing and examining the patient.  Written consent was obtained from the patient after discussion of risks, benefits, and alternatives. Patient has consented to proceed with Esophagogastroduodenoscopy with possible intervention   12/08/2022 12:21 PM  Stacy Allen  has presented today for surgery, with the diagnosis of Gastroesophageal reflux disease with hiatal hernia (K21.9,K44.9) Large hiatal hernia (K44.9) Nausea and vomiting, unspecified vomiting type (R11.2) Abdominal pain, epigastric (R10.13) Abdominal pain, generalized (R10.84).  The various methods of treatment have been discussed with the patient and family. After consideration of risks, benefits and other options for treatment, the patient has consented to  Procedure(s): ESOPHAGOGASTRODUODENOSCOPY (EGD) WITH PROPOFOL (N/A) as a surgical intervention.  The patient's history has been reviewed, patient examined, no change in status, stable for surgery.  I have reviewed the patient's chart and labs.  Questions were answered to the patient's satisfaction.     Jaynie Collins

## 2022-12-08 NOTE — Anesthesia Preprocedure Evaluation (Signed)
Anesthesia Evaluation  Patient identified by MRN, date of birth, ID band Patient awake    Reviewed: Allergy & Precautions, NPO status , [name] Chart, lab work & pertinent test results  Airway Mallampati: III  TM Distance: >3 FB Neck ROM: full    Dental  (+) Teeth Intact   Pulmonary neg pulmonary ROS, shortness of breath and with exertion, sleep apnea , former smoker   Pulmonary exam normal  + decreased breath sounds      Cardiovascular Exercise Tolerance: Poor hypertension, Pt. on medications + angina  + CAD and +CHF  negative cardio ROS Normal cardiovascular exam Rhythm:Regular Rate:Normal     Neuro/Psych  Headaches   Depression    negative neurological ROS  negative psych ROS   GI/Hepatic negative GI ROS, Neg liver ROS, hiatal hernia,GERD  Medicated,,  Endo/Other  negative endocrine ROSHypothyroidism  Morbid obesity  Renal/GU negative Renal ROS  negative genitourinary   Musculoskeletal   Abdominal  (+) + obese  Peds negative pediatric ROS (+)  Hematology negative hematology ROS (+)   Anesthesia Other Findings Past Medical History: No date: Adenomatous polyps No date: Allergic state No date: Anemia No date: Anginal pain (HCC) No date: Anxiety No date: Arthritis     Comment:  osteoarthritis No date: Broken rib     Comment:  left No date: Cancer (HCC)     Comment:  skin No date: CHF (congestive heart failure) (HCC) No date: Chicken pox No date: Complication of anesthesia     Comment:  respiratory distress after rectocele surgery No date: Coronary artery disease No date: Depression No date: Eczema No date: Fibromyalgia No date: GERD (gastroesophageal reflux disease) No date: Headache     Comment:  migraines No date: Hemorrhoids No date: History of hiatal hernia No date: Hypercalcemia No date: Hyperlipidemia No date: Hypertension No date: Hypothyroidism No date: Lumbar stenosis No date:  Obesity No date: Peptic ulcer disease No date: Shortness of breath dyspnea No date: Sleep apnea     Comment:  No CPAP No date: Vertigo No date: Vitamin D deficiency  Past Surgical History: No date: ABDOMINAL HYSTERECTOMY 2007: BACK SURGERY     Comment:  Dr. Gerrit Heck, [facility], Spinal Fusion 04/03/2015: CARDIAC CATHETERIZATION; N/A     Comment:  Procedure: Left Heart Cath and Coronary Angiography;                Surgeon: Dalia Heading, MD;  Location: [facility] INVASIVE CV               LAB;  Service: Cardiovascular;  Laterality: N/A; 04/22/2019: CATARACT EXTRACTION W/PHACO; Left     Comment:  Procedure: CATARACT EXTRACTION PHACO AND INTRAOCULAR               LENS PLACEMENT (IOC) LEFT  00:45.4  12.9%  6.03;                Surgeon: Nevada Crane, MD;  Location: [facility];  Service: Ophthalmology;  Laterality: Left;              Latex sleep apnea 07/01/2019: CATARACT EXTRACTION W/PHACO; Right     Comment:  Procedure: CATARACT EXTRACTION PHACO AND INTRAOCULAR               LENS PLACEMENT (IOC) RIGHT 2.38  00:24.4;  Surgeon: Nevada Crane, MD;  Location: [facility];                Service: Ophthalmology;  Laterality: Right;  Latex Sleep              apnea No date: CHOLECYSTECTOMY 2015: CORONARY ANGIOPLASTY     Comment:  Dr. Lady Gary, [facility] Cath Lab 12/21/2016: ESOPHAGOGASTRODUODENOSCOPY (EGD) WITH PROPOFOL; N/A     Comment:  Procedure: ESOPHAGOGASTRODUODENOSCOPY (EGD) WITH               PROPOFOL;  Surgeon: Scot Jun, MD;  Location:               [facility] ENDOSCOPY;  Service: Endoscopy;  Laterality: N/A; No date: EYE SURGERY 1991: FRACTURE SURGERY; Left     Comment:  Fractures Femur, [facility] 05/07/2020: HIP ARTHROPLASTY; Right     Comment:  Procedure: ARTHROPLASTY RIGHT HIP (HEMIARTHROPLASTY);                Surgeon: Juanell Fairly, MD;  Location: [facility] ORS;                Service: Orthopedics;  Laterality: Right; 09/28/2015:  IMAGE GUIDED SINUS SURGERY; Bilateral     Comment:  Procedure: IMAGE GUIDED SINUS SURGERY, SEPTOPLASTY,               BILATERAL FRONTAL SINUSOTOMIES, BILATERAL MAXILLARY               ANTROSTOMIES, BILATERAL TOTAL ETHMOIDECTOMY, BILATERAL               SPHENOIDECTOMY, BILATERAL INFERIOR TURBINATE REDUCTION;                Surgeon: Vernie Murders, MD;  Location: [facility] ORS;  Service:              ENT;  Laterality: Bilateral; No date: RECTOCELE REPAIR  BMI    Body Mass Index: 43.51 kg/m      Reproductive/Obstetrics negative OB ROS                             Anesthesia Physical Anesthesia Plan  ASA: 4  Anesthesia Plan: General   Post-op Pain Management:    Induction: Intravenous  PONV Risk Score and Plan: Propofol infusion and TIVA  Airway Management Planned: Natural Airway  Additional Equipment:   Intra-op Plan:   Post-operative Plan:   Informed Consent: I have reviewed the patients History and Physical, chart, labs and discussed the procedure including the risks, benefits and alternatives for the proposed anesthesia with the patient or authorized representative who has indicated his/her understanding and acceptance.     Dental Advisory Given  Plan Discussed with: CRNA and Surgeon  Anesthesia Plan Comments:        Anesthesia Quick Evaluation

## 2022-12-08 NOTE — Transfer of Care (Signed)
Immediate Anesthesia Transfer of Care Note  Patient: Stacy Allen  Procedure(s) Performed: ESOPHAGOGASTRODUODENOSCOPY (EGD) WITH PROPOFOL  Patient Location: Endoscopy Unit  Anesthesia Type:General  Level of Consciousness: drowsy and patient cooperative  Airway & Oxygen Therapy: Patient Spontanous Breathing and Patient connected to face mask oxygen  Post-op Assessment: Report given to RN and Post -op Vital signs reviewed and stable  Post vital signs: Reviewed and stable  Last Vitals:  Vitals Value Taken Time  BP 172/90 12/08/22 1234  Temp 36.2 C 12/08/22 1233  Pulse 75 12/08/22 1242  Resp 18 12/08/22 1242  SpO2 99 % 12/08/22 1242  Vitals shown include unvalidated device data.  Last Pain:  Vitals:   12/08/22 1233  TempSrc: Temporal  PainSc: Asleep         Complications: No notable events documented.

## 2022-12-08 NOTE — H&P (Signed)
Pre-Procedure H&P   Patient ID: Stacy Allen is a 82 y.o. female.  Gastroenterology Provider: Jaynie Collins, DO  Referring Provider: Jacob Moores, PA PCP: Jodi Marble, NP  Date: 12/08/2022  HPI [name] is a 82 y.o. female who presents today for Esophagogastroduodenoscopy for nausea, vomiting, abdominal pain.  Patient presented to the emergency department for nausea vomiting and epigastric pain.  This resolved while she was there and she was discharged home.  She was seen in the office thereafter.  No melena or hematochezia.  She is on chronic Celebrex as well as Eliquis.  Eliquis has been held for this procedure.  Denies dysphagia and odynophagia.  She last underwent EGD in 2018 with a large hiatal hernia.  Gastritis was also noted negative for H. pylori.  Normal esophagus at that time.  She has also undergone upper endoscopy in the past where Schatzki's ring was demonstrated was dilated with a 15 mm savory.  Currently on Protonix 40 mg daily  No other acute GI complaints   Past Medical History:  Diagnosis Date   Adenomatous polyps    Allergic state    Anemia    Anginal pain (HCC)    Anxiety    Arthritis    osteoarthritis   Broken rib    left   Cancer (HCC)    skin   CHF (congestive heart failure) (HCC)    Chicken pox    Complication of anesthesia    respiratory distress after rectocele surgery   Coronary artery disease    Depression    Eczema    Fibromyalgia    GERD (gastroesophageal reflux disease)    Headache    migraines   Hemorrhoids    History of hiatal hernia    Hypercalcemia    Hyperlipidemia    Hypertension    Hypothyroidism    Lumbar stenosis    Obesity    Peptic ulcer disease    Shortness of breath dyspnea    Sleep apnea    No CPAP   Vertigo    Vitamin D deficiency     Past Surgical History:  Procedure Laterality Date   ABDOMINAL HYSTERECTOMY     BACK SURGERY  2007   Dr. Gerrit Heck, [facility], Spinal Fusion   CARDIAC  CATHETERIZATION N/A 04/03/2015   Procedure: Left Heart Cath and Coronary Angiography;  Surgeon: Dalia Heading, MD;  Location: [facility] INVASIVE CV LAB;  Service: Cardiovascular;  Laterality: N/A;   CATARACT EXTRACTION W/PHACO Left 04/22/2019   Procedure: CATARACT EXTRACTION PHACO AND INTRAOCULAR LENS PLACEMENT (IOC) LEFT  00:45.4  12.9%  6.03;  Surgeon: Nevada Crane, MD;  Location: [facility];  Service: Ophthalmology;  Laterality: Left;  Latex sleep apnea   CATARACT EXTRACTION W/PHACO Right 07/01/2019   Procedure: CATARACT EXTRACTION PHACO AND INTRAOCULAR LENS PLACEMENT (IOC) RIGHT 2.38  00:24.4;  Surgeon: Nevada Crane, MD;  Location: [facility];  Service: Ophthalmology;  Laterality: Right;  Latex Sleep apnea   CHOLECYSTECTOMY     CORONARY ANGIOPLASTY  2015   Dr. Lady Gary, [facility] Cath Lab   ESOPHAGOGASTRODUODENOSCOPY (EGD) WITH PROPOFOL N/A 12/21/2016   Procedure: ESOPHAGOGASTRODUODENOSCOPY (EGD) WITH PROPOFOL;  Surgeon: Scot Jun, MD;  Location: [facility] ENDOSCOPY;  Service: Endoscopy;  Laterality: N/A;   EYE SURGERY     FRACTURE SURGERY Left 1991   Fractures Femur, [facility]   HIP ARTHROPLASTY Right 05/07/2020   Procedure: ARTHROPLASTY RIGHT HIP (HEMIARTHROPLASTY);  Surgeon: Juanell Fairly, MD;  Location: [facility]  ORS;  Service: Orthopedics;  Laterality: Right;   IMAGE GUIDED SINUS SURGERY Bilateral 09/28/2015   Procedure: IMAGE GUIDED SINUS SURGERY, SEPTOPLASTY, BILATERAL FRONTAL SINUSOTOMIES, BILATERAL MAXILLARY ANTROSTOMIES, BILATERAL TOTAL ETHMOIDECTOMY, BILATERAL SPHENOIDECTOMY, BILATERAL INFERIOR TURBINATE REDUCTION;  Surgeon: Vernie Murders, MD;  Location: [facility] ORS;  Service: ENT;  Laterality: Bilateral;   RECTOCELE REPAIR      Family History No h/o GI disease or malignancy  Review of Systems  Constitutional:  Negative for activity change, appetite change, chills, diaphoresis, fatigue, fever and unexpected weight change.  HENT:  Negative for trouble  swallowing and voice change.   Respiratory:  Negative for shortness of breath and wheezing.   Cardiovascular:  Negative for chest pain, palpitations and leg swelling.  Gastrointestinal:  Positive for abdominal pain, nausea and vomiting. Negative for abdominal distention, anal bleeding, blood in stool, constipation, diarrhea and rectal pain.  Musculoskeletal:  Negative for arthralgias and myalgias.  Skin:  Negative for color change and pallor.  Neurological:  Negative for dizziness, syncope and weakness.  Psychiatric/Behavioral:  Negative for confusion.   All other systems reviewed and are negative.    Medications No current facility-administered medications on file prior to encounter.   Current Outpatient Medications on File Prior to Encounter  Medication Sig Dispense Refill   amLODipine (NORVASC) 10 MG tablet Take 0.5 tablets (5 mg total) by mouth daily.     metoprolol tartrate (LOPRESSOR) 25 MG tablet Take 1 tablet (25 mg total) by mouth daily.     acetaminophen (TYLENOL) 500 MG tablet Take 500 mg by mouth every 6 (six) hours as needed.     albuterol (PROVENTIL HFA;VENTOLIN HFA) 108 (90 BASE) MCG/ACT inhaler Inhale 2 puffs into the lungs.     apixaban (ELIQUIS) 5 MG TABS tablet Take 1 tablet (5 mg total) by mouth 2 (two) times daily. Take 2 tablets (10mg ) twice daily for 7 days, then 1 tablet (5mg ) twice daily 60 tablet 2   citalopram (CELEXA) 40 MG tablet Take 40 mg by mouth daily.     clonazePAM (KLONOPIN) 1 MG tablet Take 1 mg by mouth 2 (two) times daily. As needed for anxiety     DIFICID 200 MG TABS tablet Take 1 tablet (200 mg total) by mouth 2 (two) times daily. 15 tablet 0   EPINEPHrine 0.3 mg/0.3 mL IJ SOAJ injection Inject 0.3 mg into the muscle once.     gabapentin (NEURONTIN) 600 MG tablet Take 600 mg by mouth 3 (three) times daily.     levothyroxine (SYNTHROID) 150 MCG tablet Take 150 mcg by mouth daily.     omeprazole (PRILOSEC) 20 MG capsule Take 40 mg by mouth daily.       ondansetron (ZOFRAN-ODT) 4 MG disintegrating tablet Take 1 tablet (4 mg total) by mouth every 8 (eight) hours as needed for nausea or vomiting. 30 tablet 0   pantoprazole (PROTONIX) 40 MG tablet Take 1 tablet (40 mg total) by mouth 2 (two) times daily. 60 tablet 0   Polyethyl Glycol-Propyl Glycol (SYSTANE) 0.4-0.3 % GEL ophthalmic gel Place 1 application into both eyes 2 (two) times daily as needed (dry eyes).     [DISCONTINUED] loratadine (CLARITIN) 10 MG tablet Take 1 tablet (10 mg total) by mouth daily. Take 1 tablet in the morning. As needed for itching. 30 tablet 0   [DISCONTINUED] ranitidine (ZANTAC) 150 MG capsule Take 1 capsule (150 mg total) by mouth 2 (two) times daily. 30 capsule 0    Pertinent medications related to GI and procedure  were reviewed by me with the patient prior to the procedure   Current Facility-Administered Medications:    0.9 %  sodium chloride infusion, , Intravenous, Continuous, Jaynie Collins, DO, Last Rate: 20 mL/hr at 12/08/22 1154, New Bag at 12/08/22 1154  sodium chloride 20 mL/hr at 12/08/22 1154       Allergies  Allergen Reactions   Azithromycin Diarrhea and Rash    "fainting"  Other reaction(s): Syncope  "fainting"    Other reaction(s): NOT KNOWN  "fainting"   Meloxicam Diarrhea   Morphine Other (See Comments) and Rash    Other reaction(s): Unknown Retain urine Other reaction(s): NOT KNOWN, Other (See Comments) Retain urine Other reaction(s): side effect of severe rash   Adhesive [Tape]     blisters   Codeine Nausea And Vomiting   Lactose Intolerance (Gi) Other (See Comments)    "stomach pain"   Red Dye Itching    "red food dye"   Shellfish-Derived Products Itching and Swelling   Aspirin Rash   Latex Rash    With extended contact   Lodine [Etodolac] Rash   Morphine And Codeine Hives and Nausea And Vomiting    Other reaction(s): UNKNOWN   Penicillins Rash and Hives    Given on 05/07/20.  No reaction noted.   Pravastatin  Rash   Allergies were reviewed by me prior to the procedure  Objective   Body mass index is 43.51 kg/m. Vitals:   12/08/22 1141 12/08/22 1142  BP:  (!) 160/95  Pulse:  67  Resp:  18  Temp:  (!) 97.4 F (36.3 C)  TempSrc:  Temporal  SpO2:  98%  Weight: 111.4 kg      Physical Exam Vitals and nursing note reviewed.  Constitutional:      General: She is not in acute distress.    Appearance: Normal appearance. She is obese. She is not ill-appearing, toxic-appearing or diaphoretic.  HENT:     Head: Normocephalic and atraumatic.     Nose: Nose normal.     Mouth/Throat:     Mouth: Mucous membranes are moist.     Pharynx: Oropharynx is clear.  Eyes:     General: No scleral icterus.    Extraocular Movements: Extraocular movements intact.  Cardiovascular:     Rate and Rhythm: Normal rate and regular rhythm.     Heart sounds: Normal heart sounds. No murmur heard.    No friction rub. No gallop.  Pulmonary:     Effort: Pulmonary effort is normal. No respiratory distress.     Breath sounds: Normal breath sounds. No wheezing, rhonchi or rales.  Abdominal:     General: Bowel sounds are normal. There is no distension.     Palpations: Abdomen is soft.     Tenderness: There is no abdominal tenderness. There is no guarding or rebound.  Musculoskeletal:     Cervical back: Neck supple.     Right lower leg: No edema.     Left lower leg: No edema.  Skin:    General: Skin is warm and dry.     Coloration: Skin is not jaundiced or pale.  Neurological:     General: No focal deficit present.     Mental Status: She is alert and oriented to person, place, and time. Mental status is at baseline.  Psychiatric:        Mood and Affect: Mood normal.        Behavior: Behavior normal.        Thought  Content: Thought content normal.        Judgment: Judgment normal.      Assessment:  [name] is a 82 y.o. female  who presents today for Esophagogastroduodenoscopy for nausea,  vomiting, abdominal pain.  Plan:  Esophagogastroduodenoscopy with possible intervention today  Esophagogastroduodenoscopy with possible biopsy, control of bleeding, polypectomy, and interventions as necessary has been discussed with the patient/patient representative. Informed consent was obtained from the patient/patient representative after explaining the indication, nature, and risks of the procedure including but not limited to death, bleeding, perforation, missed neoplasm/lesions, cardiorespiratory compromise, and reaction to medications. Opportunity for questions was given and appropriate answers were provided. Patient/patient representative has verbalized understanding is amenable to undergoing the procedure.   Jaynie Collins, DO  [facility]  Portions of the record may have been created with voice recognition software. Occasional wrong-word or 'sound-a-like' substitutions may have occurred due to the inherent limitations of voice recognition software.  Read the chart carefully and recognize, using context, where substitutions may have occurred.

## 2022-12-09 ENCOUNTER — Encounter: Payer: Self-pay | Admitting: Gastroenterology

## 2022-12-11 ENCOUNTER — Ambulatory Visit (INDEPENDENT_AMBULATORY_CARE_PROVIDER_SITE_OTHER): Payer: Medicare HMO

## 2022-12-11 ENCOUNTER — Ambulatory Visit: Payer: Self-pay

## 2022-12-11 ENCOUNTER — Ambulatory Visit
Admission: EM | Admit: 2022-12-11 | Discharge: 2022-12-11 | Disposition: A | Discharge status: Home / Self Care | Payer: Medicare HMO | Attending: Emergency Medicine | Admitting: Emergency Medicine

## 2022-12-11 DIAGNOSIS — S2232XA Fracture of one rib, left side, initial encounter for closed fracture: Secondary | ICD-10-CM | POA: Diagnosis not present

## 2022-12-11 DIAGNOSIS — Z23 Encounter for immunization: Secondary | ICD-10-CM

## 2022-12-11 DIAGNOSIS — R0781 Pleurodynia: Secondary | ICD-10-CM

## 2022-12-11 DIAGNOSIS — M25512 Pain in left shoulder: Secondary | ICD-10-CM | POA: Diagnosis not present

## 2022-12-11 DIAGNOSIS — W19XXXA Unspecified fall, initial encounter: Secondary | ICD-10-CM

## 2022-12-11 DIAGNOSIS — S40812A Abrasion of left upper arm, initial encounter: Secondary | ICD-10-CM

## 2022-12-11 DIAGNOSIS — L03114 Cellulitis of left upper limb: Secondary | ICD-10-CM

## 2022-12-11 MED ORDER — TETANUS-DIPHTH-ACELL PERTUSSIS 5-2.5-18.5 LF-MCG/0.5 IM SUSY
0.5000 mL | PREFILLED_SYRINGE | Freq: Once | INTRAMUSCULAR | Status: AC
  Administered 2022-12-11: 0.5 mL via INTRAMUSCULAR

## 2022-12-11 MED ORDER — DOXYCYCLINE HYCLATE 100 MG PO CAPS: 100.0000 mg | ORAL_CAPSULE | Freq: Two times a day (BID) | ORAL | 0 refills | Status: AC

## 2022-12-11 MED ORDER — MUPIROCIN 2 % EX OINT: 1.0000 | TOPICAL_OINTMENT | Freq: Two times a day (BID) | CUTANEOUS | 0 refills | Status: DC

## 2022-12-11 NOTE — Discharge Instructions (Addendum)
Apply the mupirocin ointment to the wound on your left arm.  Take the antibiotic as directed.  See the attached information on cellulitis and abrasion.  Follow up with your primary care provider tomorrow.  See the attached information on rib fracture and shoulder pain.  Follow up with an orthopedist such as the one listed below.

## 2022-12-11 NOTE — ED Triage Notes (Signed)
Patient to Urgent Care with complaints of left sided rib pain/ left arm pain with an abrasion following a fall that occurred five days ago. Also having left sided knee and shoulder pain. Also has a bruise present to her right interior wrist but does not remember injuring this (endorses some thumb pain). Having some sciatic nerve pain on the right side of her back that radiates into the right leg. Reports falling off a curb at [employer] when putting trash into the trash can. Reports falling down on her left side. Injuries occurred on Wednesday.  Patient also has multiple bug bites present to the right side of her body from her armpit down her ribs. Occurred Thursday night when sleeping.   Last dose of eliquis two weeks ago (stopped for stress test).

## 2022-12-11 NOTE — ED Provider Notes (Signed)
[name]    CSN: [id number] Arrival date & time: 12/11/22  1008      History   Chief Complaint Chief Complaint  Patient presents with   Fall    HPI Stacy Allen is a 82 y.o. female.  Patient presents with left shoulder pain, left anterior rib pain, abrasion on left arm, bruising on arms, which all started after she fell while stepping off the curb at [facility] on 12/07/2022.  No head injury or loss of consciousness.  No OTC medications taken today.  Patient stopped her Eliquis 2 weeks ago for procedure; she is scheduled to restart it today.  Patient also presents with insect bites on her right flank x 3 days.  Treating with hydrocortisone cream.  She denies fever, chills, shortness of breath, chest pain, or other symptoms.  Last tetanus unknown.  The history is provided by the patient, a relative and medical records.    Past Medical History:  Diagnosis Date   Adenomatous polyps    Allergic state    Anemia    Anginal pain (HCC)    Anxiety    Arthritis    osteoarthritis   Broken rib    left   Cancer (HCC)    skin   CHF (congestive heart failure) (HCC)    Chicken pox    Complication of anesthesia    respiratory distress after rectocele surgery   Coronary artery disease    Depression    Eczema    Fibromyalgia    GERD (gastroesophageal reflux disease)    Headache    migraines   Hemorrhoids    History of hiatal hernia    Hypercalcemia    Hyperlipidemia    Hypertension    Hypothyroidism    Lumbar stenosis    Obesity    Peptic ulcer disease    Shortness of breath dyspnea    Sleep apnea    No CPAP   Vertigo    Vitamin D deficiency     Patient Active Problem List   Diagnosis Date Noted   C. difficile diarrhea 10/21/2021   Dehydration 10/21/2021   Generalized weakness 10/21/2021   Shoulder pain, right    Closed right hip fracture (HCC) 05/06/2020   Essential hypertension 05/06/2020   Hypothyroidism 05/06/2020   Lumbar spondylosis 06/01/2017    Lumbar degenerative disc disease 06/01/2017   History of lumbar fusion 06/01/2017   Age related osteoporosis 06/01/2017   Chronic pain syndrome 06/01/2017   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 Gerrit Heck, [facility], Spinal Fusion   CARDIAC CATHETERIZATION N/A 04/03/2015   Procedure: Left Heart Cath and Coronary Angiography;  Surgeon: Dalia Heading, MD;  Location: [facility] INVASIVE CV LAB;  Service: Cardiovascular;  Laterality: N/A;   CATARACT EXTRACTION W/PHACO Left 04/22/2019   Procedure: CATARACT EXTRACTION PHACO AND INTRAOCULAR LENS PLACEMENT (IOC) LEFT  00:45.4  12.9%  6.03;  Surgeon: Nevada Crane, MD;  Location: [facility] SURGERY CNTR;  Service: Ophthalmology;  Laterality: Left;  Latex sleep apnea   CATARACT EXTRACTION W/PHACO Right 07/01/2019   Procedure: CATARACT EXTRACTION PHACO AND INTRAOCULAR LENS PLACEMENT (IOC) RIGHT 2.38  00:24.4;  Surgeon: Nevada Crane, MD;  Location: [facility];  Service: Ophthalmology;  Laterality: Right;  Latex Sleep apnea   CHOLECYSTECTOMY     CORONARY ANGIOPLASTY  2015   Dr. Lady Gary, [facility] Cath Lab  ESOPHAGOGASTRODUODENOSCOPY (EGD) WITH PROPOFOL N/A 12/21/2016   Procedure: ESOPHAGOGASTRODUODENOSCOPY (EGD) WITH PROPOFOL;  Surgeon: Scot Jun, MD;  Location: [facility] ENDOSCOPY;  Service: Endoscopy;  Laterality: N/A;   ESOPHAGOGASTRODUODENOSCOPY (EGD) WITH PROPOFOL N/A 12/08/2022   Procedure: ESOPHAGOGASTRODUODENOSCOPY (EGD) WITH PROPOFOL;  Surgeon: Jaynie Collins, DO;  Location: [facility] ENDOSCOPY;  Service: Gastroenterology;  Laterality: N/A;   EYE SURGERY     FRACTURE SURGERY Left 1991   Fractures Femur, [facility]   HIP ARTHROPLASTY Right 05/07/2020   Procedure: ARTHROPLASTY RIGHT HIP (HEMIARTHROPLASTY);  Surgeon: Juanell Fairly, MD;  Location: [facility] ORS;  Service: Orthopedics;  Laterality: Right;   IMAGE GUIDED SINUS SURGERY Bilateral  09/28/2015   Procedure: IMAGE GUIDED SINUS SURGERY, SEPTOPLASTY, BILATERAL FRONTAL SINUSOTOMIES, BILATERAL MAXILLARY ANTROSTOMIES, BILATERAL TOTAL ETHMOIDECTOMY, BILATERAL SPHENOIDECTOMY, BILATERAL INFERIOR TURBINATE REDUCTION;  Surgeon: Vernie Murders, MD;  Location: [facility] ORS;  Service: ENT;  Laterality: Bilateral;   RECTOCELE REPAIR      OB History   No obstetric history on file.      Home Medications    Prior to Admission medications   Medication Sig Start Date End Date Taking? Authorizing Provider  doxycycline (VIBRAMYCIN) 100 MG capsule Take 1 capsule (100 mg total) by mouth 2 (two) times daily for 7 days. 12/11/22 12/18/22 Yes Mickie Bail, NP  mupirocin ointment (BACTROBAN) 2 % Apply 1 Application topically 2 (two) times daily. 12/11/22  Yes Mickie Bail, NP  acetaminophen (TYLENOL) 500 MG tablet Take 500 mg by mouth every 6 (six) hours as needed.    [provider]  albuterol (PROVENTIL HFA;VENTOLIN HFA) 108 (90 BASE) MCG/ACT inhaler Inhale 2 puffs into the lungs.    [provider]  amLODipine (NORVASC) 10 MG tablet Take 0.5 tablets (5 mg total) by mouth daily. 10/23/21   Lurene Shadow, MD  apixaban (ELIQUIS) 5 MG TABS tablet Take 1 tablet (5 mg total) by mouth 2 (two) times daily. Take 2 tablets (10mg ) twice daily for 7 days, then 1 tablet (5mg ) twice daily 10/23/21   Lurene Shadow, MD  citalopram (CELEXA) 40 MG tablet Take 40 mg by mouth daily.    [provider]  clonazePAM (KLONOPIN) 1 MG tablet Take 1 mg by mouth 2 (two) times daily. As needed for anxiety    [provider]  DIFICID 200 MG TABS tablet Take 1 tablet (200 mg total) by mouth 2 (two) times daily. Patient not taking: Reported on 12/11/2022 10/23/21   Lurene Shadow, MD  EPINEPHrine 0.3 mg/0.3 mL IJ SOAJ injection Inject 0.3 mg into the muscle once. 04/20/20   [provider]  gabapentin (NEURONTIN) 600 MG tablet Take 600 mg by mouth 3 (three) times daily. 04/24/20   [provider]  levothyroxine (SYNTHROID) 150 MCG tablet Take 150 mcg by mouth daily. 09/15/21   [provider]  metoprolol tartrate (LOPRESSOR) 25 MG tablet Take 1 tablet (25 mg total) by mouth daily. 10/23/21   Lurene Shadow, MD  omeprazole (PRILOSEC) 20 MG capsule Take 40 mg by mouth daily.     [provider]  ondansetron (ZOFRAN-ODT) 4 MG disintegrating tablet Take 1 tablet (4 mg total) by mouth every 8 (eight) hours as needed for nausea or vomiting. 11/27/22   Corena Herter, MD  pantoprazole (PROTONIX) 40 MG tablet Take 1 tablet (40 mg total) by mouth 2 (two) times daily. 11/27/22 12/27/22  Corena Herter, MD  Polyethyl Glycol-Propyl Glycol (SYSTANE) 0.4-0.3 % GEL ophthalmic gel Place 1 application into both eyes 2 (two) times  daily as needed (dry eyes).    [provider]  loratadine (CLARITIN) 10 MG tablet Take 1 tablet (10 mg total) by mouth daily. Take 1 tablet in the morning. As needed for itching. 02/14/17 02/17/19  Hassan Rowan, MD  ranitidine (ZANTAC) 150 MG capsule Take 1 capsule (150 mg total) by mouth 2 (two) times daily. 02/14/17 02/17/19  Hassan Rowan, MD    Family History Family History  Problem Relation Age of Onset   Hypertension Mother    Cancer Mother    Diabetes Father    Hypertension Father    Heart failure Father    Breast cancer Neg Hx     Social History Social History   Tobacco Use   Smoking status: Former    Types: Cigarettes    Quit date: 2000    Years since quitting: 24.4   Smokeless tobacco: Never  Vaping Use   Vaping Use: Never used  Substance Use Topics   Alcohol use: No   Drug use: No     Allergies   Azithromycin, Meloxicam, Morphine, Adhesive [tape], Codeine, Lactose intolerance (gi), Red dye, Shellfish-derived products, Aspirin, Latex, Lodine [etodolac], Morphine and codeine, Penicillins, and Pravastatin   Review of Systems Review of Systems  Constitutional:  Negative for chills and fever.  Respiratory:  Negative  for cough and shortness of breath.   Cardiovascular:  Negative for chest pain and palpitations.  Musculoskeletal:  Positive for arthralgias. Negative for joint swelling.  Skin:  Positive for color change and wound.  Neurological:  Negative for weakness and numbness.     Physical Exam Triage Vital Signs ED Triage Vitals  Enc Vitals Group     BP      Pulse      Resp      Temp      Temp src      SpO2      Weight      Height      Head Circumference      Peak Flow      Pain Score      Pain Loc      Pain Edu?      Excl. in GC?    No data found.  Updated Vital Signs BP 139/87   Pulse 85   Temp 97.7 F (36.5 C)   Resp 18   SpO2 96%   Visual Acuity Right Eye Distance:   Left Eye Distance:   Bilateral Distance:    Right Eye Near:   Left Eye Near:    Bilateral Near:     Physical Exam Constitutional:      General: She is not in acute distress. HENT:     Mouth/Throat:     Mouth: Mucous membranes are moist.     Pharynx: Oropharynx is clear.  Cardiovascular:     Rate and Rhythm: Normal rate and regular rhythm.     Heart sounds: Normal heart sounds.  Pulmonary:     Effort: Pulmonary effort is normal. No respiratory distress.     Breath sounds: Normal breath sounds.  Abdominal:     General: Bowel sounds are normal.     Palpations: Abdomen is soft.     Tenderness: There is no abdominal tenderness.  Musculoskeletal:        General: Tenderness present. No swelling or deformity.     Comments: Limited ROM of left shoulder. No bruising, wounds, erythema, edema.  FROM in left elbow, wrist, fingers.  2+ radial pulse, sensation intact, strength  5/5.   Skin:    General: Skin is warm and dry.     Findings: Bruising and lesion present.     Comments: Abrasion on left forearm.  See picture.  Several areas of ecchymosis on bilateral arms.   Neurological:     General: No focal deficit present.     Mental Status: She is alert and oriented to person, place, and time.      Sensory: No sensory deficit.     Motor: No weakness.     Comments: Ambulatory with walker  Psychiatric:        Mood and Affect: Mood normal.        Behavior: Behavior normal.      UC Treatments / Results  Labs (all labs ordered are listed, but only abnormal results are displayed) Labs Reviewed - No data to display  EKG   Radiology DG Shoulder Left  Result Date: 12/11/2022 CLINICAL DATA:  Fall, left shoulder pain EXAM: LEFT SHOULDER - 2+ VIEW COMPARISON:  None Available. FINDINGS: There is no evidence of fracture or dislocation. Mild-to-moderate degenerative changes of the glenohumeral and acromioclavicular joints. Soft tissues are unremarkable. IMPRESSION: Negative. Electronically Signed   By: Duanne Guess D.O.   On: 12/11/2022 11:34   DG Ribs Unilateral W/Chest Left  Result Date: 12/11/2022 CLINICAL DATA:  Fall, left rib pain EXAM: LEFT RIBS AND CHEST - 3+ VIEW COMPARISON:  12/22/2021 FINDINGS: Slight angulation of the lateral left seventh rib seen on one view, possibly representing a site of a nondisplaced fracture. No displaced fracture identified. There is no evidence of pneumothorax or pleural effusion. Both lungs are clear. Cardiomegaly. Large hiatal hernia. IMPRESSION: Slight angulation of the lateral left seventh rib seen on one view, possibly representing a site of a nondisplaced fracture. No displaced fracture identified. Electronically Signed   By: Duanne Guess D.O.   On: 12/11/2022 11:33    Procedures Procedures (including critical care time)  Medications Ordered in UC Medications  Tdap (BOOSTRIX) injection 0.5 mL (has no administration in time range)    Initial Impression / Assessment and Plan / UC Course  I have reviewed the triage vital signs and the nursing notes.  Pertinent labs & imaging results that were available during my care of the patient were reviewed by me and considered in my medical decision making (see chart for details).    Closed  fracture of left seventh rib, rib pain, left shoulder pain, abrasion of left arm, cellulitis of left arm, fall.  X-ray of left shoulder negative.  X-ray of left ribs shows left seventh rib fracture.  Education provided on shoulder pain and rib fracture.  Instructed patient to follow-up with an orthopedist.  Contact information for on-call Ortho provided.  The abrasion on her left forearm has developed cellulitis.  Treating this with doxycycline and mupirocin ointment.  Education provided on cellulitis and abrasion.  Instructed her to follow-up with her PCP tomorrow.  Tetanus updated today.  Patient agrees to plan of care.  She is accompanied by her niece.  Final Clinical Impressions(s) / UC Diagnoses   Final diagnoses:  Acute pain of left shoulder  Rib pain on left side  Fall, initial encounter  Closed fracture of one rib of left side, initial encounter  Cellulitis of left arm  Abrasion of left upper extremity, initial encounter     Discharge Instructions      Apply the mupirocin ointment to the wound on your left arm.  Take the antibiotic as directed.  See the attached information on cellulitis and abrasion.  Follow up with your primary care provider tomorrow.  See the attached information on rib fracture and shoulder pain.  Follow up with an orthopedist such as the one listed below.       ED Prescriptions     Medication Sig Dispense Auth. Provider   doxycycline (VIBRAMYCIN) 100 MG capsule Take 1 capsule (100 mg total) by mouth 2 (two) times daily for 7 days. 14 capsule Mickie Bail, NP   mupirocin ointment (BACTROBAN) 2 % Apply 1 Application topically 2 (two) times daily. 22 g Mickie Bail, NP      PDMP not reviewed this encounter.   Mickie Bail, NP 12/11/22 1158

## 2022-12-20 ENCOUNTER — Ambulatory Visit: Payer: Medicare HMO | Admitting: Clinical

## 2022-12-20 DIAGNOSIS — F330 Major depressive disorder, recurrent, mild: Secondary | ICD-10-CM

## 2022-12-20 NOTE — Progress Notes (Signed)
[facility] Counselor/Therapist Progress Note  Patient ID: Stacy Allen, MRN: [id number],    Date: 12/20/2022  Time Spent: 1:36pm - 2:44pm : 68 minutes   Treatment Type: Individual Therapy  Reported Symptoms: Patient reported sadness and anger  Mental Status Exam: Appearance:  Neat and Well Groomed     Behavior: Appropriate  Motor: Normal  Speech/Language:  Clear and Coherent  Affect: Tearful  Mood: angry and sad  Thought process: normal  Thought content:   WNL  Sensory/Perceptual disturbances:   WNL  Orientation: oriented to person, place, and situation  Attention: Good  Concentration: Good  Memory: WNL  Fund of knowledge:  Good  Insight:   Fair  Judgment:  Good  Impulse Control: Good   Risk Assessment: Danger to Self:  No Patient denied current suicidal ideation  Self-injurious Behavior: No Danger to Others: No Patient denied current homicidal ideation Duty to Warn:no Physical Aggression / Violence:No  Access to Firearms a concern: No  Gang Involvement:No   Subjective: Patient reported a recent a stress test, laparoscopy procedure, and reported she recently fell in the parking lot at [employer] and broke her rib. Patient reported she is only able to eat a small amount of food since the laparoscopy procedure and fall. Patient reported she sent a message to her provider to discuss her concerns. Patient stated, "I am so tired". Patient reported difficulty sleeping due to pain.  Patient reported she has follow up appointments with multiple providers. In addition, patient reported she has been bitten by several spiders. Patient stated, "I'm fed up", "I'm physically hurt".  Patient reported she feels the current psychotropic medications she is taking are not working. Patient stated, "I get angry over things I shouldn't get angry over". Patient reported feelings of anger regarding her sister's appointment today and reported her nephew was scheduled to  exterminated her home today but had to take patient's sister to an appointment. Patient stated,  "I'm miserable" in response to patient's current living situation. Patient reported she is concerned she may isolate herself more if she moves to another environment. Patient reported feeling "my daughter turned my grandchildren against me" and reported she does not know why she was not able to see her grandchildren. Patient stated, "I don't have the nerve", "I make sure I don't have a means" in response to current protective factors and history of suicidal ideation.   Interventions: Cognitive Behavioral Therapy and Motivational Interviewing. Clinician conducted session in person at clinician's office at [facility]. Provided supportive therapy, active and reflective listening, and validation as patient discussed current medical concerns and the impact on patient. Explored and identified triggers for feelings of sadness and anger. Explored housing options and transportation resources. Explored and identified current protective factors. Clinician utilized motivational interviewing to explore potential goals for therapy. Clinician utilized a task centered approach in collaboration with patient to finalize goals for therapy. Patient participated in development of goals and agreed to goals for therapy. Clinician requested patient complete gratitude journal for homework.    Collaboration of Care: Other discussed consent for referral to a psychiatrist      Diagnosis:  Mild episode of recurrent major depressive disorder (HCC)   Anxiety disorder, unspecified type     Plan: Patient is to utilize Cognitive Behavioral Therapy, thought re-framing, mindfulness and coping strategies to decrease symptoms associated with their diagnosis. Frequency: bi-weekly  Modality: individual      Long-term goal:   Patient stated, "I guess I want to  see my being able to recognize and appreciate what I have instead of  focusing on all I've lost".  Target Date: 12/01/23  Progress: progressing    Short-term goal:  Write at least one positive affirmation about herself and/or patient's environment each day Target Date: 06/02/23  Progress: progressing    Practice gratitude exercises to decrease symptoms of depression Target Date: 06/02/23  Progress: progressing    Identify, challenge, and replace negative thought patterns that contribute to feelings of depression and anxiety with positive thoughts and beliefs per patient's report Target Date: 06/02/23  Progress: progressing    Develop and implement strategies to increase self care, such as, participating in water exercise class, accepting invitations to social functions   Target Date: 06/02/23  Progress: progressing    verbalize positive statements regarding self and her ability to cope with life stressors     Target Date: 06/02/23  Progress: progressing                  Doree Barthel, LCSW

## 2022-12-20 NOTE — Progress Notes (Signed)
                Toshiye Kever, LCSW 

## 2022-12-20 NOTE — Progress Notes (Signed)
                Mc Hollen, LCSW 

## 2022-12-28 ENCOUNTER — Ambulatory Visit: Payer: Medicare HMO | Admitting: Clinical

## 2022-12-28 DIAGNOSIS — F330 Major depressive disorder, recurrent, mild: Secondary | ICD-10-CM | POA: Diagnosis not present

## 2022-12-28 DIAGNOSIS — F419 Anxiety disorder, unspecified: Secondary | ICD-10-CM

## 2022-12-28 NOTE — Progress Notes (Signed)
                Donnelle Olmeda, LCSW 

## 2022-12-28 NOTE — Progress Notes (Addendum)
[facility] Counselor/Therapist Progress Note  Patient ID: Stacy Allen, MRN: [id number],    Date: 12/28/2022  Time Spent: 9:38am - 10:41am : 63 minutes   Treatment Type: Individual Therapy  Reported Symptoms: Patient reported feeling overwhelmed  Mental Status Exam: Appearance:  Neat and Well Groomed     Behavior: Appropriate  Motor: Normal  Speech/Language:  Clear and Coherent  Affect: Appropriate  Mood: overwhelmed  Thought process: normal  Thought content:   WNL  Sensory/Perceptual disturbances:   WNL  Orientation: oriented to person, place, and situation  Attention: Good  Concentration: Good  Memory: WNL  Fund of knowledge:  Good  Insight:   Fair  Judgment:  Good  Impulse Control: Good   Risk Assessment: Danger to Self:  No Patient denied current suicidal ideation  Self-injurious Behavior: No Danger to Others: No Patient denied current homicidal ideation Duty to Warn:no Physical Aggression / Violence:No  Access to Firearms a concern: No  Gang Involvement:No   Subjective: Patient stated, "I'm overwhelmed" and reported feeling overwhelmed due to multiple medical appointments and changes in appointments. Patient reported she is being scheduled for a heart cath and has to complete lab work today in preparation for the heart cath. Patient stated,  "I don't know why they've come up with so many changes on me so quick" in regards to recent changes in medical appointments. Patient reported in the past when medical appointments become overwhelming patient has called to cancel her appointments. Patient reported concern the orthopedic physician will not be able to address patient's shoulder concerns and rib concerns. Patient reported recently she has experienced negative reactions to several antibiotics. Patient reported from 7-11 pm every night patient experiences difficulty laying down and discomfort when laying down. Patient reported feeling overwhelmed at night.  Patient stated, "I feel like I'm putting people out" in regards to patient's niece and nephew providing transportation for patient. Patient reported she recently became upset when patient's niece changed patient's password and patient reported she threw the phone in response. Patient reported feeling frustrated the she can not access her accounts due to her niece changing patient's passwords. Patient reported her nephew recently took patient out to breakfast and patient identified their breakfast as a positive that has occurred since last session.   Interventions: Cognitive Behavioral Therapy and supportive therapy.  Clinician conducted session in person at clinician's office at [facility]. Provided supportive therapy, active listening, and validation as patient discussed her thoughts and feelings related to multiple medical appointments, medical concerns, and the impact on patient's relationship with her niece and nephew. Discussed patient's behavioral response when feeling overwhelmed by medical appointments. Assisted patient in exploring triggers for feeling overwhelmed. Challenged cognitive distortions. Provided psycho education related to cognitive distortions and challenging cognitive distortions. Discussed patient following up with her physician to discuss symptoms occurring at night and pain. Reviewed patient's homework. Provided psycho education related to the use of visualization/imagery. Clinician requested for homework patient continue gratitude journal and practice visualization/imagery exercises.    Collaboration of Care: not required at this time   Diagnosis:  Mild episode of recurrent major depressive disorder (HCC)   Anxiety disorder, unspecified type     Plan: Patient is to utilize [employer] [facility], thought re-framing, mindfulness and coping strategies to decrease symptoms associated with their diagnosis. Frequency: bi-weekly  Modality: individual       Long-term goal:   Patient stated, "I guess I want to see my being able to recognize and appreciate what  I have instead of focusing on all I've lost".  Target Date: 12/01/23  Progress: progressing    Short-term goal:  Write at least one positive affirmation about herself and/or patient's environment each day Target Date: 06/02/23  Progress: progressing    Practice gratitude exercises to decrease symptoms of depression Target Date: 06/02/23  Progress: progressing    Identify, challenge, and replace negative thought patterns that contribute to feelings of depression and anxiety with positive thoughts and beliefs per patient's report Target Date: 06/02/23  Progress: progressing    Develop and implement strategies to increase self care, such as, participating in water exercise class, accepting invitations to social functions   Target Date: 06/02/23  Progress: progressing    verbalize positive statements regarding self and her ability to cope with life stressors     Target Date: 06/02/23  Progress: progressing             Doree Barthel, LCSW

## 2023-01-18 ENCOUNTER — Telehealth: Payer: Self-pay | Admitting: Emergency Medicine

## 2023-01-18 ENCOUNTER — Encounter: Payer: Self-pay | Admitting: Emergency Medicine

## 2023-01-18 ENCOUNTER — Ambulatory Visit
Admission: EM | Admit: 2023-01-18 | Discharge: 2023-01-18 | Disposition: A | Discharge status: Home / Self Care | Payer: Medicare HMO | Attending: Emergency Medicine | Admitting: Emergency Medicine

## 2023-01-18 DIAGNOSIS — R21 Rash and other nonspecific skin eruption: Secondary | ICD-10-CM

## 2023-01-18 MED ORDER — PREDNISONE 10 MG (21) PO TBPK: ORAL_TABLET | Freq: Every day | ORAL | 0 refills | Status: DC

## 2023-01-18 MED ORDER — VALACYCLOVIR HCL 1 G PO TABS: 1000.0000 mg | ORAL_TABLET | Freq: Three times a day (TID) | ORAL | 0 refills | Status: AC

## 2023-01-18 MED ORDER — HYDROXYZINE HCL 25 MG PO TABS: 25.0000 mg | ORAL_TABLET | Freq: Four times a day (QID) | ORAL | 0 refills | Status: DC

## 2023-01-18 NOTE — Telephone Encounter (Signed)
Attempted to reach patient via telephone x 1, left voicemail to return call, attempting to discuss additional medication sent to pharmacy

## 2023-01-18 NOTE — ED Triage Notes (Signed)
Reports having felt dizzy over the last couple days, ignored it. Describes having felt a burning/tingling/itching sensation to shoulder. Describes a hyperalgesia over area of rash. Unilateral. Has not received shingles vaccine yet.

## 2023-01-18 NOTE — Discharge Instructions (Addendum)
Today you were evaluated for your rash, across the upper part of your back there are red circular patches, this is not consistent with shingles as it is on both the left and the right side when typically shingles is unilateral  Begin prednisone every morning as directed, take with food, this helps to stop the inflammatory process, will also help with itching, please notify your cardiologist that you are taking this medicine since you have a procedure tomorrow  You may use hydroxyzine every 6 hours as needed for management of itching  If symptoms continue to persist or worsen at any point please follow-up for reevaluation

## 2023-01-18 NOTE — ED Provider Notes (Signed)
Stacy Allen    CSN: [id number] Arrival date & time: 01/18/23  0841      History   Chief Complaint Chief Complaint  Patient presents with   Rash    HPI Stacy Allen is a 82 y.o. female.   Patient presents for evaluation of a possible rash present to the right side of the back occurring overnight.  Rash causing an intense tingling, burning and pruritic sensation to the shoulder.  Has not attempted treatment.  Has not yet received shingles vaccine.  Over the past week has had heightened anxiety, unsure if related.  Past Medical History:  Diagnosis Date   Adenomatous polyps    Allergic state    Anemia    Anginal pain (HCC)    Anxiety    Arthritis    osteoarthritis   Broken rib    left   Cancer (HCC)    skin   CHF (congestive heart failure) (HCC)    Chicken pox    Complication of anesthesia    respiratory distress after rectocele surgery   Coronary artery disease    Depression    Eczema    Fibromyalgia    GERD (gastroesophageal reflux disease)    Headache    migraines   Hemorrhoids    History of hiatal hernia    Hypercalcemia    Hyperlipidemia    Hypertension    Hypothyroidism    Lumbar stenosis    Obesity    Peptic ulcer disease    Shortness of breath dyspnea    Sleep apnea    No CPAP   Vertigo    Vitamin D deficiency     Patient Active Problem List   Diagnosis Date Noted   C. 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 Gerrit Heck,  [facility], Spinal Fusion   CARDIAC CATHETERIZATION N/A 04/03/2015   Procedure: Left Heart Cath and Coronary Angiography;  Surgeon: Dalia Heading, MD;  Location: [facility] INVASIVE CV LAB;  Service: Cardiovascular;  Laterality: N/A;   CATARACT EXTRACTION W/PHACO Left 04/22/2019   Procedure: CATARACT EXTRACTION PHACO AND INTRAOCULAR LENS PLACEMENT (IOC) LEFT  00:45.4  12.9%  6.03;  Surgeon: Nevada Crane, MD;  Location: [facility];  Service: Ophthalmology;  Laterality: Left;  Latex sleep apnea   CATARACT EXTRACTION W/PHACO Right 07/01/2019   Procedure: CATARACT EXTRACTION PHACO AND INTRAOCULAR LENS PLACEMENT (IOC) RIGHT 2.38  00:24.4;  Surgeon: Nevada Crane, MD;  Location: [facility];  Service: Ophthalmology;  Laterality: Right;  Latex Sleep apnea   CHOLECYSTECTOMY     CORONARY ANGIOPLASTY  2015   Dr. Lady Gary, [facility] Cath Lab   ESOPHAGOGASTRODUODENOSCOPY (EGD) WITH PROPOFOL N/A 12/21/2016   Procedure: ESOPHAGOGASTRODUODENOSCOPY (EGD) WITH PROPOFOL;  Surgeon: Scot Jun, MD;  Location: [facility] ENDOSCOPY;  Service: Endoscopy;  Laterality: N/A;   ESOPHAGOGASTRODUODENOSCOPY (EGD) WITH PROPOFOL N/A 12/08/2022   Procedure: ESOPHAGOGASTRODUODENOSCOPY (EGD) WITH PROPOFOL;  Surgeon: Jaynie Collins, DO;  Location: [facility] ENDOSCOPY;  Service: Gastroenterology;  Laterality: N/A;   EYE SURGERY     FRACTURE SURGERY Left 1991   Fractures Femur, [facility]   HIP ARTHROPLASTY Right 05/07/2020   Procedure: ARTHROPLASTY RIGHT HIP (HEMIARTHROPLASTY);  Surgeon: Juanell Fairly, MD;  Location: [facility] ORS;  Service: Orthopedics;  Laterality: Right;   IMAGE GUIDED SINUS SURGERY Bilateral 09/28/2015   Procedure: IMAGE GUIDED SINUS SURGERY, SEPTOPLASTY, BILATERAL FRONTAL SINUSOTOMIES, BILATERAL MAXILLARY ANTROSTOMIES, BILATERAL TOTAL ETHMOIDECTOMY, BILATERAL SPHENOIDECTOMY, BILATERAL INFERIOR TURBINATE REDUCTION;  Surgeon: Vernie Murders, MD;  Location: [facility] ORS;  Service: ENT;  Laterality:  Bilateral;   RECTOCELE REPAIR      OB History   No obstetric history on file.      Home Medications    Prior to Admission medications   Medication Sig Start Date End Date Taking? Authorizing Provider  apixaban (ELIQUIS) 5 MG TABS tablet Take 1 tablet (5 mg total) by mouth 2 (two) times daily. Take 2 tablets (10mg ) twice daily for 7 days, then 1 tablet (5mg ) twice daily 10/23/21  Yes Lurene Shadow, MD  hydrOXYzine (ATARAX) 25 MG tablet Take 1 tablet (25 mg total) by mouth every 6 (six) hours. 01/18/23  Yes Dontavis Tschantz R, NP  predniSONE (STERAPRED UNI-PAK 21 TAB) 10 MG (21) TBPK tablet Take by mouth daily. Take 6 tabs by mouth daily  for 1 days, then 5 tabs for 1 days, then 4 tabs for 1 days, then 3 tabs for 1 days, 2 tabs for 1 days, then 1 tab by mouth daily for  1 days 01/18/23  Yes Ivalee Strauser R, NP  acetaminophen (TYLENOL) 500 MG tablet Take 500 mg by mouth every 6 (six) hours as needed.    [provider]  albuterol (PROVENTIL HFA;VENTOLIN HFA) 108 (90 BASE) MCG/ACT inhaler Inhale 2 puffs into the lungs.    [provider]  amLODipine (NORVASC) 10 MG tablet Take 0.5 tablets (5 mg total) by mouth daily. 10/23/21   Lurene Shadow, MD  citalopram (CELEXA) 40 MG tablet Take 40 mg by mouth daily.    [provider]  clonazePAM (KLONOPIN) 1 MG tablet Take 1 mg by mouth 2 (two) times daily. As needed for anxiety    [provider]  DIFICID 200 MG TABS tablet Take 1 tablet (200 mg total) by mouth 2 (two) times daily. Patient not taking: Reported on 12/11/2022 10/23/21   Lurene Shadow, MD  EPINEPHrine 0.3 mg/0.3 mL IJ SOAJ injection Inject 0.3 mg into the muscle once. 04/20/20   [provider]  gabapentin (NEURONTIN) 600 MG tablet Take 600 mg by mouth 3 (three) times daily. 04/24/20   [provider]  levothyroxine (SYNTHROID) 150 MCG tablet Take 150 mcg by mouth daily. 09/15/21   [provider]  metoprolol tartrate (LOPRESSOR)  25 MG tablet Take 1 tablet (25 mg total) by mouth daily. 10/23/21   Lurene Shadow, MD  mupirocin ointment (BACTROBAN) 2 % Apply 1 Application topically 2 (two) times daily. 12/11/22   Mickie Bail, NP  omeprazole (PRILOSEC) 20 MG capsule Take 40 mg by mouth daily.     [provider]  ondansetron (ZOFRAN-ODT) 4 MG disintegrating tablet Take 1 tablet (4 mg total) by mouth every 8 (eight) hours as needed for nausea or vomiting. 11/27/22   Corena Herter, MD  pantoprazole (PROTONIX) 40 MG tablet Take 1 tablet (40 mg total) by mouth 2 (two) times daily. 11/27/22 12/27/22  Corena Herter, MD  Polyethyl Glycol-Propyl Glycol (SYSTANE) 0.4-0.3 % GEL ophthalmic gel Place 1 application into both  eyes 2 (two) times daily as needed (dry eyes).    [provider]  loratadine (CLARITIN) 10 MG tablet Take 1 tablet (10 mg total) by mouth daily. Take 1 tablet in the morning. As needed for itching. 02/14/17 02/17/19  Hassan Rowan, MD  ranitidine (ZANTAC) 150 MG capsule Take 1 capsule (150 mg total) by mouth 2 (two) times daily. 02/14/17 02/17/19  Hassan Rowan, MD    Family History Family History  Problem Relation Age of Onset   Hypertension Mother    Cancer Mother    Diabetes Father    Hypertension Father    Heart failure Father    Breast cancer Neg Hx     Social History Social History   Tobacco Use   Smoking status: Former    Current packs/day: 0.00    Types: Cigarettes    Quit date: 2000    Years since quitting: 24.5   Smokeless tobacco: Never  Vaping Use   Vaping status: Never Used  Substance Use Topics   Alcohol use: No   Drug use: No     Allergies   Azithromycin, Meloxicam, Morphine, Adhesive [tape], Codeine, Lactose intolerance (gi), Red dye, Shellfish-derived products, Aspirin, Latex, Lodine [etodolac], Morphine and codeine, Penicillins, and Pravastatin   Review of Systems Review of Systems  Skin:  Positive for rash.     Physical Exam Triage Vital Signs ED Triage  Vitals  Encounter Vitals Group     BP 01/18/23 0903 (!) 163/85     Systolic BP Percentile --      Diastolic BP Percentile --      Pulse Rate 01/18/23 0903 72     Resp 01/18/23 0903 18     Temp 01/18/23 0903 99.1 F (37.3 C)     Temp Source 01/18/23 0903 Oral     SpO2 01/18/23 0903 99 %     Weight --      Height --      Head Circumference --      Peak Flow --      Pain Score 01/18/23 0905 5     Pain Loc --      Pain Education --      Exclude from Growth Chart --    No data found.  Updated Vital Signs BP (!) 163/85 (BP Location: Right Arm)   Pulse 72   Temp 99.1 F (37.3 C) (Oral)   Resp 18   SpO2 99%   Visual Acuity Right Eye Distance:   Left Eye Distance:   Bilateral Distance:    Right Eye Near:   Left Eye Near:    Bilateral Near:     Physical Exam Constitutional:      Appearance: Normal appearance.  Eyes:     Extraocular Movements: Extraocular movements intact.  Pulmonary:     Effort: Pulmonary effort is normal.  Skin:    Comments: Erythematous macular rash present to the right upper thoracic region of back, 1 small circular macular lesion present on the left side of the upper thoracic  Neurological:     Mental Status: She is alert and oriented to person, place, and time. Mental status is at baseline.      UC Treatments / Results  Labs (all labs ordered are listed, but only abnormal results are displayed) Labs Reviewed - No data to display  EKG   Radiology No results found.  Procedures Procedures (including critical care time)  Medications Ordered in UC Medications - No data to display  Initial  Impression / Assessment and Plan / UC Course  I have reviewed the triage vital signs and the nursing notes.  Pertinent labs & imaging results that were available during my care of the patient were reviewed by me and considered in my medical decision making (see chart for details).  Rash  Symptoms consistent with shingles, however one lesion on left  side, prophylactic will provide coverage, valacyclovir sent to pharmacy , prednisone and hydroxyzine sent for inflammatory coverage. No signs of bacterial infection, may follow up if symptoms persist or worsen  Final Clinical Impressions(s) / UC Diagnoses   Final diagnoses:  Rash     Discharge Instructions      Today you were evaluated for your rash, across the upper part of your back there are red circular patches, this is not consistent with shingles as it is on both the left and the right side when typically shingles is unilateral  Begin prednisone every morning as directed, take with food, this helps to stop the inflammatory process, will also help with itching, please notify your cardiologist that you are taking this medicine since you have a procedure tomorrow  You may use hydroxyzine every 6 hours as needed for management of itching  If symptoms continue to persist or worsen at any point please follow-up for reevaluation   ED Prescriptions     Medication Sig Dispense Auth. Provider   predniSONE (STERAPRED UNI-PAK 21 TAB) 10 MG (21) TBPK tablet Take by mouth daily. Take 6 tabs by mouth daily  for 1 days, then 5 tabs for 1 days, then 4 tabs for 1 days, then 3 tabs for 1 days, 2 tabs for 1 days, then 1 tab by mouth daily for  1 days 21 tablet Lebron Nauert R, NP   hydrOXYzine (ATARAX) 25 MG tablet Take 1 tablet (25 mg total) by mouth every 6 (six) hours. 12 tablet Chaya Dehaan, Elita Boone, NP      PDMP not reviewed this encounter.   Valinda Hoar, NP 01/18/23 1002

## 2023-01-19 ENCOUNTER — Encounter: Payer: Self-pay | Admitting: Cardiology

## 2023-01-19 ENCOUNTER — Ambulatory Visit
Admission: RE | Admit: 2023-01-19 | Discharge: 2023-01-19 | Disposition: A | Discharge status: Home / Self Care | Payer: Medicare HMO | Attending: Cardiology | Admitting: Cardiology

## 2023-01-19 ENCOUNTER — Other Ambulatory Visit: Payer: Self-pay

## 2023-01-19 ENCOUNTER — Encounter
Admission: RE | Disposition: A | Discharge status: Home / Self Care | Payer: Self-pay | Source: Home / Self Care | Attending: Cardiology

## 2023-01-19 DIAGNOSIS — Z955 Presence of coronary angioplasty implant and graft: Secondary | ICD-10-CM | POA: Diagnosis not present

## 2023-01-19 DIAGNOSIS — R9430 Abnormal result of cardiovascular function study, unspecified: Secondary | ICD-10-CM

## 2023-01-19 DIAGNOSIS — I2510 Atherosclerotic heart disease of native coronary artery without angina pectoris: Secondary | ICD-10-CM | POA: Diagnosis not present

## 2023-01-19 HISTORY — PX: LEFT HEART CATH AND CORONARY ANGIOGRAPHY: CATH118249

## 2023-01-19 SURGERY — LEFT HEART CATH AND CORONARY ANGIOGRAPHY
Anesthesia: Moderate Sedation | Laterality: Left

## 2023-01-19 MED ORDER — DIPHENHYDRAMINE HCL 50 MG/ML IJ SOLN
INTRAMUSCULAR | Status: AC
  Filled 2023-01-19: qty 1

## 2023-01-19 MED ORDER — FENTANYL CITRATE (PF) 100 MCG/2ML IJ SOLN
INTRAMUSCULAR | Status: DC | PRN
  Administered 2023-01-19: 25 ug via INTRAVENOUS

## 2023-01-19 MED ORDER — ONDANSETRON HCL 4 MG/2ML IJ SOLN: 4.0000 mg | Freq: Four times a day (QID) | INTRAMUSCULAR | Status: DC | PRN

## 2023-01-19 MED ORDER — HEPARIN SODIUM (PORCINE) 1000 UNIT/ML IJ SOLN
INTRAMUSCULAR | Status: DC | PRN
  Administered 2023-01-19: 4000 [IU] via INTRAVENOUS

## 2023-01-19 MED ORDER — FENTANYL CITRATE (PF) 100 MCG/2ML IJ SOLN
INTRAMUSCULAR | Status: AC
  Filled 2023-01-19: qty 2

## 2023-01-19 MED ORDER — LIDOCAINE HCL 1 % IJ SOLN
INTRAMUSCULAR | Status: AC
  Filled 2023-01-19: qty 20

## 2023-01-19 MED ORDER — SODIUM CHLORIDE 0.9 % WEIGHT BASED INFUSION
3.0000 mL/kg/h | INTRAVENOUS | Status: AC
  Administered 2023-01-19: 3 mL/kg/h via INTRAVENOUS

## 2023-01-19 MED ORDER — MIDAZOLAM HCL 2 MG/2ML IJ SOLN
INTRAMUSCULAR | Status: DC | PRN
  Administered 2023-01-19: 1 mg via INTRAVENOUS

## 2023-01-19 MED ORDER — VERAPAMIL HCL 2.5 MG/ML IV SOLN
INTRAVENOUS | Status: AC
  Filled 2023-01-19: qty 2

## 2023-01-19 MED ORDER — SODIUM CHLORIDE 0.9 % WEIGHT BASED INFUSION: 1.0000 mL/kg/h | INTRAVENOUS | Status: DC

## 2023-01-19 MED ORDER — HYDRALAZINE HCL 20 MG/ML IJ SOLN: 10.0000 mg | INTRAMUSCULAR | Status: DC | PRN

## 2023-01-19 MED ORDER — ACETAMINOPHEN 325 MG PO TABS: 650.0000 mg | ORAL_TABLET | ORAL | Status: DC | PRN

## 2023-01-19 MED ORDER — IOHEXOL 300 MG/ML  SOLN
INTRAMUSCULAR | Status: DC | PRN
  Administered 2023-01-19: 88 mL

## 2023-01-19 MED ORDER — SODIUM CHLORIDE 0.9% FLUSH: 3.0000 mL | Freq: Two times a day (BID) | INTRAVENOUS | Status: DC

## 2023-01-19 MED ORDER — METHYLPREDNISOLONE SODIUM SUCC 125 MG IJ SOLR
INTRAMUSCULAR | Status: DC | PRN
  Administered 2023-01-19: 125 mg via INTRAVENOUS

## 2023-01-19 MED ORDER — LABETALOL HCL 5 MG/ML IV SOLN: 10.0000 mg | INTRAVENOUS | Status: DC | PRN

## 2023-01-19 MED ORDER — VERAPAMIL HCL 2.5 MG/ML IV SOLN
INTRAVENOUS | Status: DC | PRN
  Administered 2023-01-19: 2.5 mg via INTRAVENOUS

## 2023-01-19 MED ORDER — HEPARIN SODIUM (PORCINE) 1000 UNIT/ML IJ SOLN
INTRAMUSCULAR | Status: AC
  Filled 2023-01-19: qty 10

## 2023-01-19 MED ORDER — HEPARIN (PORCINE) IN NACL 1000-0.9 UT/500ML-% IV SOLN
INTRAVENOUS | Status: AC
  Filled 2023-01-19: qty 1000

## 2023-01-19 MED ORDER — METHYLPREDNISOLONE SODIUM SUCC 125 MG IJ SOLR
INTRAMUSCULAR | Status: AC
  Filled 2023-01-19: qty 2

## 2023-01-19 MED ORDER — MIDAZOLAM HCL 2 MG/2ML IJ SOLN
INTRAMUSCULAR | Status: AC
  Filled 2023-01-19: qty 2

## 2023-01-19 MED ORDER — DIPHENHYDRAMINE HCL 50 MG/ML IJ SOLN
INTRAMUSCULAR | Status: DC | PRN
  Administered 2023-01-19: 50 mg via INTRAVENOUS

## 2023-01-19 MED ORDER — SODIUM CHLORIDE 0.9 % IV SOLN: 250.0000 mL | INTRAVENOUS | Status: DC | PRN

## 2023-01-19 MED ORDER — LIDOCAINE HCL (PF) 1 % IJ SOLN
INTRAMUSCULAR | Status: DC | PRN
  Administered 2023-01-19: 30 mL

## 2023-01-19 MED ORDER — SODIUM CHLORIDE 0.9% FLUSH: 3.0000 mL | INTRAVENOUS | Status: DC | PRN

## 2023-01-19 MED ORDER — HEPARIN (PORCINE) IN NACL 2000-0.9 UNIT/L-% IV SOLN
INTRAVENOUS | Status: DC | PRN
  Administered 2023-01-19: 1000 mL

## 2023-01-19 MED ORDER — SODIUM CHLORIDE 0.9 % WEIGHT BASED INFUSION
1.0000 mL/kg/h | INTRAVENOUS | Status: DC
  Administered 2023-01-19: 1 mL/kg/h via INTRAVENOUS

## 2023-01-19 SURGICAL SUPPLY — 10 items
CATH 5FR JL3.5 JR4 ANG PIG MP (CATHETERS) ×1
DEVICE RAD TR BAND REGULAR (VASCULAR PRODUCTS) ×1
DRAPE BRACHIAL (DRAPES) ×1
GLIDESHEATH SLEND SS 6F .021 (SHEATH) ×1
INQWIRE 1.5J .035X260CM (WIRE) ×1
KIT SYRINGE INJ CVI SPIKEX1 (MISCELLANEOUS) ×1
PACK CARDIAC CATH (CUSTOM PROCEDURE TRAY) ×1
PROTECTION STATION PRESSURIZED (MISCELLANEOUS) ×1
SET ATX-X65L (MISCELLANEOUS) ×1
WIRE HITORQ VERSACORE ST 145CM (WIRE) ×1

## 2023-01-20 ENCOUNTER — Encounter: Payer: Self-pay | Admitting: Cardiology

## 2023-02-01 ENCOUNTER — Ambulatory Visit: Payer: Medicare HMO | Admitting: Clinical

## 2023-02-01 DIAGNOSIS — F330 Major depressive disorder, recurrent, mild: Secondary | ICD-10-CM

## 2023-02-01 DIAGNOSIS — F419 Anxiety disorder, unspecified: Secondary | ICD-10-CM

## 2023-02-01 NOTE — Progress Notes (Unsigned)
                Kharisma Glasner, LCSW 

## 2023-02-01 NOTE — Progress Notes (Unsigned)
[facility] Counselor/Therapist Progress Note  Patient ID: Stacy Allen, MRN: [id number],    Date: 02/01/2023  Time Spent: 12:33pm - 1:36pm : 63 minutes   Treatment Type: Individual Therapy  Reported Symptoms: Patient reported increased anxiety at night and loss of interest  Mental Status Exam: Appearance:  Neat and Well Groomed     Behavior: Appropriate  Motor: Normal  Speech/Language:  Clear and Coherent  Affect: Appropriate  Mood: normal  Thought process: normal  Thought content:   WNL  Sensory/Perceptual disturbances:   WNL  Orientation: oriented to person, place, and situation  Attention: Good  Concentration: Good  Memory: WNL  Fund of knowledge:  Good  Insight:   Fair  Judgment:  Good  Impulse Control: Good   Risk Assessment: Danger to Self:  No Patient denied current suicidal ideation  Self-injurious Behavior: No Danger to Others: No Patient denied current homicidal ideation Duty to Warn:no Physical Aggression / Violence:No  Access to Firearms a concern: No  Gang Involvement:No   Subjective: Patient stated, "July was a terrible month as far as health".  Patient reported she had an endoscopy and a heart cath last month. Patient reported she feels depressive symptoms and anger increased last month. Patient reported last month she discovered she was not taking her Celexa and was out of her medication. Patient reported her niece contacted patient's PCP and patient was able to obtain her medication. Patient stated, "things have been better with [place]" and reported her niece has recognized patient's desire to be independent. Patient stated, "I'm holding on so hard to what little I have left" in regards to patient's independence. Patient reported improvement in mood since resuming the Celexa. Patient reported she was diagnosed with a hiatal hernia and is being treated with medication.  Patient reported she has been diagnosed with anemia and experiencing fatigue.  Patient reported increased anxiety at night. Patient reported she tried to utilize deep breathing techniques but reported she did not find it helpful. Patient reported she plans to look for guided imagery exercises that are offered in audio. Patient stated, "Its good today" in response to patient's current mood.  Patient reported feeling she does not have an outlet and reported loss of interest in activities. Patient reported she enjoys dominos, bingo, and reading.   Interventions: Cognitive Behavioral Therapy and supportive therapy . Clinician conducted session in person at clinician's office at [facility]. Reviewed events since last session. Discussed increase in symptoms during the month of July and contributing factors to recent increase. Provided supportive therapy and active listening as patient discussed the status of patient's recent health concerns. Assessed patient's mood. Explored potential activities patient might enjoy, such as, Book Club through [employer] and bingo at the local senior center. Clinician requested for homework patient continue gratitude journal and practice visualization/imagery exercises.    Collaboration of Care: not required at this time   Diagnosis:  Mild episode of recurrent major depressive disorder (HCC)   Anxiety disorder, unspecified type     Plan: Patient is to utilize [employer], thought re-framing, mindfulness and coping strategies to decrease symptoms associated with their diagnosis. Frequency: bi-weekly  Modality: individual      Long-term goal:   Patient stated, "I guess I want to see my being able to recognize and appreciate what I have instead of focusing on all I've lost".  Target Date: 12/01/23  Progress: progressing    Short-term goal:  Write at least one positive affirmation about herself  and/or patient's environment each day Target Date: 06/02/23  Progress: progressing    Practice gratitude exercises to decrease  symptoms of depression Target Date: 06/02/23  Progress: progressing    Identify, challenge, and replace negative thought patterns that contribute to feelings of depression and anxiety with positive thoughts and beliefs per patient's report Target Date: 06/02/23  Progress: progressing    Develop and implement strategies to increase self care, such as, participating in water exercise class, accepting invitations to social functions   Target Date: 06/02/23  Progress: progressing    verbalize positive statements regarding self and her ability to cope with life stressors     Target Date: 06/02/23  Progress: progressing       Doree Barthel, LCSW

## 2023-02-09 ENCOUNTER — Ambulatory Visit: Payer: Medicare HMO | Admitting: Clinical

## 2023-02-09 DIAGNOSIS — F419 Anxiety disorder, unspecified: Secondary | ICD-10-CM | POA: Diagnosis not present

## 2023-02-09 DIAGNOSIS — F330 Major depressive disorder, recurrent, mild: Secondary | ICD-10-CM

## 2023-02-09 NOTE — Progress Notes (Signed)
[facility] Counselor/Therapist Progress Note  Patient ID: Stacy Allen, MRN: [id number],    Date: 02/09/2023  Time Spent: 1:35pm - 2:34pm : 59 minutes   Treatment Type: Individual Therapy  Reported Symptoms: Patient reported loss of interest  Mental Status Exam: Appearance:  Neat and Well Groomed     Behavior: Appropriate  Motor: Normal  Speech/Language:  Clear and Coherent  Affect: Appropriate  Mood: normal  Thought process: normal  Thought content:   WNL  Sensory/Perceptual disturbances:   WNL  Orientation: oriented to person, place, and situation  Attention: Good  Concentration: Good  Memory: WNL  Fund of knowledge:  Good  Insight:   Fair  Judgment:  Good  Impulse Control: Good   Risk Assessment: Danger to Self:  No Patient denied current suicidal ideation  Self-injurious Behavior: No Danger to Others: No Patient denied current homicidal ideation Duty to Warn:no Physical Aggression / Violence:No  Access to Firearms a concern: No  Gang Involvement:No   Subjective: Patient stated, "I've gotten to the point that everything is a chore" and reported a loss of interest.  Patient reported her niece looked up audio options for guided imagery exercises. Patient reported she was uncertain of the options niece provided and reported she wanted to discuss with clinician. Patient reported changes in her vision has impacted patient's ability to drive longer distances. Patient reported she reviewed information discussed during last session and reported she is interested in activities at the [facility]. Patient reported she feels the senior center would give her more options of activities at one location. Patient reported she does not know how long she will feel comfortable driving due to changes in her eye sight. Patient reported she plans to ask a family member to provide transportation to the senior center two days per week. Patient stated, "my mood has been  pretty good".   Interventions: Cognitive Behavioral Therapy and task centered . Clinician conducted session in person at clinician's office at [facility]. Reviewed events since last session. Reviewed patient's homework to practice visualization/imagery exercises and the outcome of discussion during previous session in regards to activities. Explored audio options for guided visualization/imagery exercises. Discussed barriers to patient participating in activities. Explored transportation resources and provided patient with contact information for the following resources: [employer] and [employer]. Assessed patient's mood. Clinician requested for homework patient continue gratitude journal and practice visualization/imagery exercises.   Collaboration of Care: not required at this time   Diagnosis:  Mild episode of recurrent major depressive disorder (HCC)   Anxiety disorder, unspecified type     Plan: Patient is to utilize [employer], thought re-framing, mindfulness and coping strategies to decrease symptoms associated with their diagnosis. Frequency: bi-weekly  Modality: individual      Long-term goal:   Patient stated, "I guess I want to see my being able to recognize and appreciate what I have instead of focusing on all I've lost".  Target Date: 12/01/23  Progress: progressing    Short-term goal:  Write at least one positive affirmation about herself and/or patient's environment each day Target Date: 06/02/23  Progress: progressing    Practice gratitude exercises to decrease symptoms of depression Target Date: 06/02/23  Progress: progressing    Identify, challenge, and replace negative thought patterns that contribute to feelings of depression and anxiety with positive thoughts and beliefs per patient's report Target Date: 06/02/23  Progress: progressing    Develop and implement strategies to increase self care, such as,  participating in water exercise class,  accepting invitations to social functions   Target Date: 06/02/23  Progress: progressing    verbalize positive statements regarding self and her ability to cope with life stressors     Target Date: 06/02/23  Progress: progressing    Doree Barthel, LCSW

## 2023-02-09 NOTE — Progress Notes (Signed)
                Karen Sharpe, LCSW 

## 2023-03-15 ENCOUNTER — Ambulatory Visit: Payer: Medicare HMO | Admitting: Clinical

## 2023-03-20 ENCOUNTER — Other Ambulatory Visit: Payer: Self-pay

## 2023-03-20 ENCOUNTER — Emergency Department
Admission: EM | Admit: 2023-03-20 | Discharge: 2023-03-20 | Disposition: A | Discharge status: Home / Self Care | Payer: Medicare HMO | Attending: Emergency Medicine | Admitting: Emergency Medicine

## 2023-03-20 DIAGNOSIS — N390 Urinary tract infection, site not specified: Secondary | ICD-10-CM | POA: Insufficient documentation

## 2023-03-20 DIAGNOSIS — I10 Essential (primary) hypertension: Secondary | ICD-10-CM | POA: Diagnosis not present

## 2023-03-20 DIAGNOSIS — E039 Hypothyroidism, unspecified: Secondary | ICD-10-CM | POA: Diagnosis not present

## 2023-03-20 DIAGNOSIS — R300 Dysuria: Secondary | ICD-10-CM | POA: Diagnosis present

## 2023-03-20 LAB — URINALYSIS, ROUTINE W REFLEX MICROSCOPIC
Bilirubin Urine: NEGATIVE
Glucose, UA: NEGATIVE mg/dL
Hgb urine dipstick: NEGATIVE
Ketones, ur: NEGATIVE mg/dL
Nitrite: POSITIVE — AB
Protein, ur: 30 mg/dL — AB
Specific Gravity, Urine: 1.023 (ref 1.005–1.030)
WBC, UA: >50 WBC/hpf (ref 0–5)
pH: 5 (ref 5.0–8.0)

## 2023-03-20 MED ORDER — CEFDINIR 300 MG PO CAPS
300.0000 mg | ORAL_CAPSULE | Freq: Two times a day (BID) | ORAL | 0 refills | Status: AC
Start: 2023-03-20 — End: 2023-03-27

## 2023-03-20 NOTE — ED Triage Notes (Signed)
Pt to ED for discomfort while urinating and some difficulty urinating. Last went at 0700 this am. Has been taking OTC meds.

## 2023-03-20 NOTE — Discharge Instructions (Signed)
Take the antibiotics as prescribed for your urinary tract infection.  Please return for any new, worsening, or change in symptoms or other concerns.  It was a pleasure caring for you today.

## 2023-03-20 NOTE — ED Provider Notes (Signed)
[facility] Provider Note    Event Date/Time   First MD Initiated Contact with Patient 03/20/23 1302     (approximate)   History   Dysuria   HPI  Stacy Allen is a 82 y.o. female who presents today for evaluation of burning with urination, urgency, and frequency for the past several days.  She denies abdominal pain or flank pain.  She has not had any nausea or vomiting.  She has not had any fevers or chills.  She denies weakness.  She reports that she has had urinary tract infections in the past and feels that this is the same.  Patient Active Problem List   Diagnosis Date Noted   C. 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          Physical Exam   Triage Vital Signs: ED Triage Vitals  Encounter Vitals Group     BP 03/20/23 1224 98/77     Systolic BP Percentile --      Diastolic BP Percentile --      Pulse Rate 03/20/23 1221 73     Resp 03/20/23 1221 18     Temp 03/20/23 1221 97.9 F (36.6 C)     Temp src --      SpO2 03/20/23 1221 96 %     Weight 03/20/23 1222 240 lb (108.9 kg)     Height 03/20/23 1222 5\' 3"  (1.6 m)     Head Circumference --      Peak Flow --      Pain Score 03/20/23 1222 5     Pain Loc --      Pain Education --      Exclude from Growth Chart --     Most recent vital signs: Vitals:   03/20/23 1224 03/20/23 1330  BP: 98/77 (!) 147/78  Pulse:  70  Resp:  18  Temp:    SpO2:  97%    Physical Exam Vitals and nursing note reviewed.  Constitutional:      General: Awake and alert. No acute distress.     Appearance: Normal appearance. The patient is obese.  HENT:     Head: Normocephalic and atraumatic.     Mouth: Mucous membranes are moist.  Eyes:     General: PERRL. Normal EOMs        Right eye: No discharge.        Left eye: No discharge.     Conjunctiva/sclera: Conjunctivae normal.  Cardiovascular:     Rate and Rhythm: Normal rate and regular rhythm.     Pulses: Normal pulses.  Pulmonary:     Effort: Pulmonary effort is normal. No respiratory distress.     Breath sounds: Normal breath sounds.  Abdominal:     Abdomen is soft. There is no abdominal tenderness. No rebound or guarding. No distention.  No CVA tenderness Musculoskeletal:        General: No swelling. Normal range of motion.     Cervical back: Normal range of motion and neck supple.  Skin:    General: Skin is warm and dry.  Capillary Refill: Capillary refill takes less than 2 seconds.     Findings: No rash.  Neurological:     Mental Status: The patient is awake and alert.      ED Results / Procedures / Treatments   Labs (all labs ordered are listed, but only abnormal results are displayed) Labs Reviewed  URINALYSIS, ROUTINE W REFLEX MICROSCOPIC - Abnormal; Notable for the following components:      Result Value   Color, Urine AMBER (*)    APPearance HAZY (*)    Protein, ur 30 (*)    Nitrite POSITIVE (*)    Leukocytes,Ua SMALL (*)    Bacteria, UA FEW (*)    All other components within normal limits     EKG     RADIOLOGY     PROCEDURES:  Critical Care performed:   Procedures   MEDICATIONS ORDERED IN ED: Medications - No data to display   IMPRESSION / MDM / ASSESSMENT AND PLAN / ED COURSE  I reviewed the triage vital signs and the nursing notes.   Differential diagnosis includes, but is not limited to, urinary tract infection, urethritis, pyelonephritis.  Patient is awake and alert, hemodynamically stable and afebrile.  Her original blood pressure obtained in triage was 98/77  however this was promptly rechecked by the RN when she was roomed and it was 147/78.  The RN reports that the cuff was very loose on her arm and in the abdominal location.  Urinalysis is positive for urinary tract infection with positive leukocytes and nitrites.  She has no CVA tenderness, fever, chills, abdominal pain.  I do not suspect pyelonephritis.  She is able to her with a steady gait.  She has no constitutional symptoms.  She is appropriate for outpatient follow-up.  I reviewed the patient's chart.  She has had multiple urine cultures in the past with pansensitive E. coli.  No history of drug-resistant organism.  She was started on cefdinir.  We discussed return precautions and outpatient follow-up.  Patient understands and agrees with plan.  She was discharged in stable condition.   [name] presentation is most consistent with acute illness / injury with system symptoms.  FINAL CLINICAL IMPRESSION(S) / ED DIAGNOSES   Final diagnoses:  Lower urinary tract infectious disease     Rx / DC Orders   ED Discharge Orders          Ordered    cefdinir (OMNICEF) 300 MG capsule  2 times daily        03/20/23 1332             Note:  This document was prepared using Dragon voice recognition software and may include unintentional dictation errors.   Jackelyn Hoehn, PA-C 03/20/23 1416    Trinna Post, MD 03/21/23 [phone]

## 2023-03-20 NOTE — ED Notes (Signed)
See triage note  Presents with dysuria  States the sx's started several days ago   But became worse yesterday  No fever

## 2023-03-21 ENCOUNTER — Ambulatory Visit: Payer: Self-pay

## 2023-03-28 ENCOUNTER — Ambulatory Visit: Payer: Medicare HMO | Admitting: Clinical

## 2023-03-28 DIAGNOSIS — F330 Major depressive disorder, recurrent, mild: Secondary | ICD-10-CM

## 2023-03-28 DIAGNOSIS — F419 Anxiety disorder, unspecified: Secondary | ICD-10-CM

## 2023-03-28 NOTE — Progress Notes (Signed)
[facility] Counselor/Therapist Progress Note  Patient ID: Stacy Allen, MRN: [id number],    Date: 03/28/2023  Time Spent: 12:31pm - 1:32pm : 61 minutes   Treatment Type: Individual Therapy  Reported Symptoms: Patient reported difficulty sleeping, worry  Mental Status Exam: Appearance:  Casual     Behavior: Appropriate  Motor: Normal  Speech/Language:  Clear and Coherent  Affect: Appropriate  Mood: Patient reported feeling "worried"  Thought process: normal  Thought content:   WNL  Sensory/Perceptual disturbances:   WNL  Orientation: oriented to person, place, and situation  Attention: Good  Concentration: Good  Memory: WNL  Fund of knowledge:  Good  Insight:   Fair  Judgment:  Good  Impulse Control: Good   Risk Assessment: Danger to Self:  No Self-injurious Behavior: No Danger to Others: No Duty to Warn:no Physical Aggression / Violence:No  Access to Firearms a concern: No  Gang Involvement:No   Subjective: Patient reported she is not feeling well today and reported she missed her last therapy appointment due to not feeling well. Patient reported she has been feeling light headed, recently had a UTI and was seen in the emergency room for UTI. Patient reported she has been getting up frequently at night to use the restroom and reported difficulty sleeping. Patient reported her niece recently fell and is not able to drive at this time. Patient reported concern about transportation to and from medical appointments. Patient reported patient's caregivers currently need assistance due to changes in caregivers' health. Patient stated, "I just feel like I don't know what it is" and stated, "I just don't know what to do" in response to medical concerns/symptoms. Patient reported she has a follow up appointment with patient's PCP at the end of October. Patient reported she recently asked her niece to order groceries for patient and patient stated,  "it felt like just  one more thing". Patient reported she has considered changing her PCP to a location closer to patient's home. Patient reported she experiences symptoms of anxiety at night. Patient reported she is concerned about patient's caregivers' ability to provide care to patient and provide transportation. Patient stated, "I feel worried maybe" in response to patient's current mood. Patient reported she is uncertain how to reach out to local resources. Patient reported she did not complete homework assignment.   Interventions: Cognitive Behavioral Therapy and task centered.   Clinician conducted session in person at clinician's office at [facility]. Reviewed events since last session. Provided supportive therapy, active listening, and validation as patient discussed current medical concerns. Discussed patient following up with patient's PCP to discuss concerns/symptoms. Assisted patient in discussing and identifying thoughts/feelings triggered by recent changes in caregiver's health. Explored and identified resources for transportation to medical appointments and resources for primary care services in close proximity to patient's home. Provided local resources for transportation and primary care services.  Assessed patient's mood since last session and assessed patient's current mood. Clinician requested for homework patient continue gratitude journal and practice visualization/imagery exercises.    Collaboration of Care: not required at this time   Diagnosis:  Mild episode of recurrent major depressive disorder (HCC)   Anxiety disorder, unspecified type     Plan: Patient is to utilize [employer], thought re-framing, mindfulness and coping strategies to decrease symptoms associated with their diagnosis. Frequency: bi-weekly  Modality: individual      Long-term goal:   Patient stated, "I guess I want to see my being able to recognize and appreciate  what I have instead of focusing on  all I've lost".  Target Date: 12/01/23  Progress: progressing    Short-term goal:  Write at least one positive affirmation about herself and/or patient's environment each day Target Date: 06/02/23  Progress: progressing    Practice gratitude exercises to decrease symptoms of depression Target Date: 06/02/23  Progress: progressing    Identify, challenge, and replace negative thought patterns that contribute to feelings of depression and anxiety with positive thoughts and beliefs per patient's report Target Date: 06/02/23  Progress: progressing    Develop and implement strategies to increase self care, such as, participating in water exercise class, accepting invitations to social functions   Target Date: 06/02/23  Progress: progressing    verbalize positive statements regarding self and her ability to cope with life stressors     Target Date: 06/02/23  Progress: progressing    Doree Barthel, LCSW

## 2023-03-28 NOTE — Progress Notes (Signed)
                Dezi Schaner, LCSW 

## 2023-04-12 ENCOUNTER — Ambulatory Visit
Admission: EM | Admit: 2023-04-12 | Discharge: 2023-04-12 | Disposition: A | Discharge status: Home / Self Care | Payer: Medicare HMO | Attending: Emergency Medicine | Admitting: Emergency Medicine

## 2023-04-12 ENCOUNTER — Ambulatory Visit (INDEPENDENT_AMBULATORY_CARE_PROVIDER_SITE_OTHER): Payer: Medicare HMO

## 2023-04-12 ENCOUNTER — Telehealth: Payer: Self-pay | Admitting: Emergency Medicine

## 2023-04-12 ENCOUNTER — Encounter: Payer: Self-pay | Admitting: Emergency Medicine

## 2023-04-12 DIAGNOSIS — J069 Acute upper respiratory infection, unspecified: Secondary | ICD-10-CM

## 2023-04-12 MED ORDER — ONDANSETRON HCL 4 MG/2ML IJ SOLN
4.0000 mg | Freq: Once | INTRAMUSCULAR | Status: AC
  Administered 2023-04-12: 4 mg via INTRAMUSCULAR

## 2023-04-12 MED ORDER — ONDANSETRON HCL 8 MG PO TABS
8.0000 mg | ORAL_TABLET | Freq: Once | ORAL | Status: AC
  Administered 2023-04-12: 8 mg via ORAL

## 2023-04-12 MED ORDER — AMOXICILLIN 500 MG PO CAPS: 500.0000 mg | ORAL_CAPSULE | Freq: Two times a day (BID) | ORAL | 0 refills | Status: DC

## 2023-04-12 MED ORDER — MECLIZINE HCL 12.5 MG PO TABS: 12.5000 mg | ORAL_TABLET | Freq: Three times a day (TID) | ORAL | 0 refills | Status: DC | PRN

## 2023-04-12 MED ORDER — PREDNISONE 20 MG PO TABS: 40.0000 mg | ORAL_TABLET | Freq: Every day | ORAL | 0 refills | Status: DC

## 2023-04-12 MED ORDER — ONDANSETRON 4 MG PO TBDP: 4.0000 mg | ORAL_TABLET | Freq: Three times a day (TID) | ORAL | 0 refills | Status: DC | PRN

## 2023-04-12 NOTE — Telephone Encounter (Signed)
Attempted to report chest x-ray results, left voicemail to return call to clinic

## 2023-04-12 NOTE — Discharge Instructions (Addendum)
Chest x-ray is pending, you will be notified of results via telephone  Antibiotic has been provided as you have been experiencing worsening breathing symptoms for 10 days  Begin amoxicillin every morning and every evening for 7 days, will take 48 hours to fully get in your system but if any bacteria is present should start to see small improvement day by day  Given prednisone to open and relax the airway, take every morning with food for 5 days  Your doctor has already placed a referral to pulmonology, you should be called within the upcoming weeks so they can set up an appointment  You may continue to use Imodium as needed for management of diarrhea, take as directed  Been given an injection of Zofran here today in the office to help minimize nausea, at home you may use every 8 hours by placing under tongue and allowed to dissolve  Until diarrhea has resolved increase your fluid intake to maintain your hydration, continue to eat food is normal  For dizziness you may use meclizine every 8 hours as needed  While dizziness is present change positions slowly waiting at least 10 to 15 seconds between movements to help the body stabilize  While dizziness is present always use her cane whenever walking, always have shoes with grips and turn on lights to help prevent injury  If your symptoms continue to persist or worsen at any point please follow-up with urgent care or your primary doctor for reevaluation

## 2023-04-12 NOTE — ED Triage Notes (Signed)
Pt states her symptoms started when she received the shingles, flu and covid vaccine at one time 10 days ago. For the past few days she has dizziness, fatigue, SOB, nausea and upset stomach. Her symptoms were worse last night. She reports diarrhea 3 days ago.

## 2023-04-12 NOTE — ED Provider Notes (Signed)
Stacy Allen    CSN: [id number] Arrival date & time: 04/12/23  1449      History   Chief Complaint Chief Complaint  Patient presents with   Dizziness   Shortness of Breath   Nausea    HPI Stacy Allen is a 82 y.o. female.   Patient presents for evaluation of dizziness, bilateral ear fullness, left sided ear pain,  Diarrhea, nausea and worsening shortness of breath and wheezing present for 10 days.  Diarrhea began 5 days ago, has begun to improve after use of Imodium slowing over the last 24 to 48 hours.  Has had worsening nausea and dry heaving this morning.  Dizziness described as feeling as if she is unbalanced, worsened by changing positions and movements of the head.  Experiencing a mild nonproductive cough.  Has been able to tolerate food and liquids during this timeframe.  No known sick contacts.  Denies fever, sore throat.  Symptoms started after receiving COVID, flu and shingles vaccine on the same day.    Past Medical History:  Diagnosis Date   Adenomatous polyps    Allergic state    Anemia    Anginal pain (HCC)    Anxiety    Arthritis    osteoarthritis   Broken rib    left   Cancer (HCC)    skin   CHF (congestive heart failure) (HCC)    Chicken pox    Complication of anesthesia    respiratory distress after rectocele surgery   Coronary artery disease    Depression    Eczema    Fibromyalgia    GERD (gastroesophageal reflux disease)    Headache    migraines   Hemorrhoids    History of hiatal hernia    Hypercalcemia    Hyperlipidemia    Hypertension    Hypothyroidism    Lumbar stenosis    Obesity    Peptic ulcer disease    Shortness of breath dyspnea    Sleep apnea    No CPAP   Vertigo    Vitamin D deficiency     Patient Active Problem List   Diagnosis Date Noted   C. difficile diarrhea 10/21/2021   Dehydration 10/21/2021   Generalized weakness 10/21/2021   Shoulder pain, right    Closed right hip fracture (HCC) 05/06/2020    Essential hypertension 05/06/2020   Hypothyroidism 05/06/2020   Lumbar spondylosis 06/01/2017   Lumbar degenerative disc disease 06/01/2017   History of lumbar fusion 06/01/2017   Age related osteoporosis 06/01/2017   Chronic pain syndrome 06/01/2017   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    Past Surgical History:  Procedure Laterality Date   ABDOMINAL HYSTERECTOMY     BACK SURGERY  2007   Dr. Gerrit Heck, [facility], Spinal Fusion   CARDIAC CATHETERIZATION N/A 04/03/2015   Procedure: Left Heart Cath and Coronary Angiography;  Surgeon: Dalia Heading, MD;  Location: [facility] INVASIVE CV LAB;  Service: Cardiovascular;  Laterality: N/A;   CATARACT EXTRACTION W/PHACO Left 04/22/2019   Procedure: CATARACT EXTRACTION PHACO AND INTRAOCULAR LENS PLACEMENT (IOC) LEFT  00:45.4  12.9%  6.03;  Surgeon: Nevada Crane, MD;  Location: [facility];  Service: Ophthalmology;  Laterality: Left;  Latex sleep apnea   CATARACT EXTRACTION W/PHACO Right 07/01/2019   Procedure: CATARACT EXTRACTION PHACO AND INTRAOCULAR LENS PLACEMENT (IOC) RIGHT 2.38  00:24.4;  Surgeon: Nevada Crane, MD;  Location: [facility];  Service: Ophthalmology;  Laterality: Right;  Latex Sleep apnea  CHOLECYSTECTOMY     CORONARY ANGIOPLASTY  2015   Dr. Lady Gary, [facility]   ESOPHAGOGASTRODUODENOSCOPY (EGD) WITH PROPOFOL N/A 12/21/2016   Procedure: ESOPHAGOGASTRODUODENOSCOPY (EGD) WITH PROPOFOL;  Surgeon: Scot Jun, MD;  Location: [facility] ENDOSCOPY;  Service: Endoscopy;  Laterality: N/A;   ESOPHAGOGASTRODUODENOSCOPY (EGD) WITH PROPOFOL N/A 12/08/2022   Procedure: ESOPHAGOGASTRODUODENOSCOPY (EGD) WITH PROPOFOL;  Surgeon: Jaynie Collins, DO;  Location: [facility] ENDOSCOPY;  Service: Gastroenterology;  Laterality: N/A;   EYE SURGERY     FRACTURE SURGERY Left 1991   Fractures Femur, [facility]   HIP ARTHROPLASTY Right 05/07/2020   Procedure: ARTHROPLASTY RIGHT HIP (HEMIARTHROPLASTY);  Surgeon: Juanell Fairly,  MD;  Location: [facility] ORS;  Service: Orthopedics;  Laterality: Right;   IMAGE GUIDED SINUS SURGERY Bilateral 09/28/2015   Procedure: IMAGE GUIDED SINUS SURGERY, SEPTOPLASTY, BILATERAL FRONTAL SINUSOTOMIES, BILATERAL MAXILLARY ANTROSTOMIES, BILATERAL TOTAL ETHMOIDECTOMY, BILATERAL SPHENOIDECTOMY, BILATERAL INFERIOR TURBINATE REDUCTION;  Surgeon: Vernie Murders, MD;  Location: [facility] ORS;  Service: ENT;  Laterality: Bilateral;   LEFT HEART CATH AND CORONARY ANGIOGRAPHY Left 01/19/2023   Procedure: LEFT HEART CATH AND CORONARY ANGIOGRAPHY;  Surgeon: Marcina Millard, MD;  Location: [facility] INV[facility] CV [facility];  Service: Cardiovascular;  Laterality: Left;   RECTOCELE REPAIR      OB History   No obstetric history on file.      Home Medications    Prior to Admission medications   Medication Sig Start Date End Date Taking? Authorizing Provider  amoxicillin (AMOXIL) 500 MG capsule Take 1 capsule (500 mg total) by mouth 2 (two) times daily for 7 days. 04/12/23 04/19/23 Yes Ondrea Dow, Elita Boone, NP  meclizine (ANTIVERT) 12.5 MG tablet Take 1 tablet (12.5 mg total) by mouth 3 (three) times daily as needed for dizziness. 04/12/23  Yes Tanush Drees R, NP  ondansetron (ZOFRAN-ODT) 4 MG disintegrating tablet Take 1 tablet (4 mg total) by mouth every 8 (eight) hours as needed for nausea or vomiting. 04/12/23  Yes Maven Rosander R, NP  predniSONE (DELTASONE) 20 MG tablet Take 2 tablets (40 mg total) by mouth daily. 04/12/23  Yes Nene Aranas R, NP  acetaminophen (TYLENOL) 500 MG tablet Take 500 mg by mouth every 6 (six) hours as needed.    [provider]  albuterol (PROVENTIL HFA;VENTOLIN HFA) 108 (90 BASE) MCG/ACT inhaler Inhale 2 puffs into the lungs.    [provider]  amLODipine (NORVASC) 10 MG tablet Take 0.5 tablets (5 mg total) by mouth daily. 10/23/21   Lurene Shadow, MD  apixaban (ELIQUIS) 5 MG TABS tablet Take 1 tablet (5 mg total) by mouth 2 (two) times daily. Take 2 tablets (10mg )  twice daily for 7 days, then 1 tablet (5mg ) twice daily 10/23/21   Lurene Shadow, MD  citalopram (CELEXA) 40 MG tablet Take 40 mg by mouth daily.    [provider]  clonazePAM (KLONOPIN) 1 MG tablet Take 1 mg by mouth 2 (two) times daily. As needed for anxiety    [provider]  EPINEPHrine 0.3 mg/0.3 mL IJ SOAJ injection Inject 0.3 mg into the muscle once. 04/20/20   [provider]  gabapentin (NEURONTIN) 600 MG tablet Take 600 mg by mouth 3 (three) times daily. 04/24/20   [provider]  hydrOXYzine (ATARAX) 25 MG tablet Take 1 tablet (25 mg total) by mouth every 6 (six) hours. 01/18/23   Valinda Hoar, NP  levothyroxine (SYNTHROID) 150 MCG tablet Take 150 mcg by mouth daily. 09/15/21   [provider]  metoprolol tartrate (LOPRESSOR)  25 MG tablet Take 1 tablet (25 mg total) by mouth daily. 10/23/21   Lurene Shadow, MD  omeprazole (PRILOSEC) 20 MG capsule Take 40 mg by mouth daily.     [provider]  ondansetron (ZOFRAN-ODT) 4 MG disintegrating tablet Take 1 tablet (4 mg total) by mouth every 8 (eight) hours as needed for nausea or vomiting. 11/27/22   Corena Herter, MD  pantoprazole (PROTONIX) 40 MG tablet Take 1 tablet (40 mg total) by mouth 2 (two) times daily. 11/27/22 01/19/23  Corena Herter, MD  Polyethyl Glycol-Propyl Glycol (SYSTANE) 0.4-0.3 % GEL ophthalmic gel Place 1 application into both eyes 2 (two) times daily as needed (dry eyes).    [provider]  loratadine (CLARITIN) 10 MG tablet Take 1 tablet (10 mg total) by mouth daily. Take 1 tablet in the morning. As needed for itching. 02/14/17 02/17/19  Hassan Rowan, MD  ranitidine (ZANTAC) 150 MG capsule Take 1 capsule (150 mg total) by mouth 2 (two) times daily. 02/14/17 02/17/19  Hassan Rowan, MD    Family History Family History  Problem Relation Age of Onset   Hypertension Mother    Cancer Mother    Diabetes Father    Hypertension Father    Heart failure Father     Breast cancer Neg Hx     Social History Social History   Tobacco Use   Smoking status: Former    Current packs/day: 0.00    Types: Cigarettes    Quit date: 2000    Years since quitting: 24.8   Smokeless tobacco: Never  Vaping Use   Vaping status: Never Used  Substance Use Topics   Alcohol use: No   Drug use: No     Allergies   Azithromycin, Meloxicam, Morphine, Adhesive [tape], Codeine, Lactose intolerance (gi), Red dye #40 (allura red), Shellfish-derived products, Aspirin, Latex, Lodine [etodolac], Morphine and codeine, Penicillins, and Pravastatin   Review of Systems Review of Systems  Respiratory:  Positive for shortness of breath.   Neurological:  Positive for dizziness.     Physical Exam Triage Vital Signs ED Triage Vitals [04/12/23 1459]  Encounter Vitals Group     BP 129/79     Systolic BP Percentile      Diastolic BP Percentile      Pulse Rate 67     Resp 18     Temp 97.8 F (36.6 C)     Temp Source Oral     SpO2 98 %     Weight      Height      Head Circumference      Peak Flow      Pain Score      Pain Loc      Pain Education      Exclude from Growth Chart    No data found.  Updated Vital Signs BP 129/79 (BP Location: Left Arm)   Pulse 67   Temp 97.8 F (36.6 C) (Oral)   Resp 18   SpO2 98%   Visual Acuity Right Eye Distance:   Left Eye Distance:   Bilateral Distance:    Right Eye Near:   Left Eye Near:    Bilateral Near:     Physical Exam Constitutional:      Appearance: Normal appearance.  HENT:     Head: Normocephalic.     Right Ear: Tympanic membrane, ear canal and external ear normal.     Left Ear: Tympanic membrane, ear canal and external ear normal.  Nose: Congestion present. No rhinorrhea.     Mouth/Throat:     Pharynx: No oropharyngeal exudate or posterior oropharyngeal erythema.  Eyes:     Extraocular Movements: Extraocular movements intact.  Cardiovascular:     Rate and Rhythm: Normal rate and regular rhythm.      Pulses: Normal pulses.     Heart sounds: Normal heart sounds.  Pulmonary:     Breath sounds: Normal breath sounds.     Comments: Dyspnea on exertion Abdominal:     General: Abdomen is flat. Bowel sounds are normal. There is no distension.     Palpations: Abdomen is soft.     Tenderness: There is no abdominal tenderness. There is no guarding.  Musculoskeletal:     Cervical back: Normal range of motion and neck supple.  Neurological:     Mental Status: She is alert and oriented to person, place, and time. Mental status is at baseline.      UC Treatments / Results  Labs (all labs ordered are listed, but only abnormal results are displayed) Labs Reviewed - No data to display  EKG   Radiology No results found.  Procedures Procedures (including critical care time)  Medications Ordered in UC Medications  ondansetron (ZOFRAN) tablet 8 mg (8 mg Oral Given 04/12/23 1516)  ondansetron (ZOFRAN) injection 4 mg (4 mg Intramuscular Given 04/12/23 1603)    Initial Impression / Assessment and Plan / UC Course  I have reviewed the triage vital signs and the nursing notes.  Pertinent labs & imaging results that were available during my care of the patient were reviewed by me and considered in my medical decision making (see chart for details).  Acute uri  Possibly viral related, endorses that shortness of breath and wheezing were present prior to new symptoms beginning but worsened, endorses she was evaluated by her cardiologist, catheterization completed, deemed not to be the heart, pulmonology referral placed 2 days ago, chest x-ray pending, placed on amoxicillin and prednisone prophylactically, ODT Zofran and IM Zofran given in office, prescribed for home use as well as meclizine for symptomatic management advised increase fluid intake until able to tolerate diarrhea has resolved to maintain hydration advise supportive measures and safety measures for dizziness, advised follow-up if  symptoms continue to persist or worsen  Final Clinical Impressions(s) / UC Diagnoses   Final diagnoses:  Acute URI     Discharge Instructions      Chest x-ray is pending, you will be notified of results via telephone  Antibiotic has been provided as you have been experiencing worsening breathing symptoms for 10 days  Begin amoxicillin every morning and every evening for 7 days, will take 48 hours to fully get in your system but if any bacteria is present should start to see small improvement day by day  Given prednisone to open and relax the airway, take every morning with food for 5 days  Your doctor has already placed a referral to pulmonology, you should be called within the upcoming weeks so they can set up an appointment  You may continue to use Imodium as needed for management of diarrhea, take as directed  Been given an injection of Zofran here today in the office to help minimize nausea, at home you may use every 8 hours by placing under tongue and allowed to dissolve  Until diarrhea has resolved increase your fluid intake to maintain your hydration, continue to eat food is normal  For dizziness you may use meclizine every 8 hours  as needed  While dizziness is present change positions slowly waiting at least 10 to 15 seconds between movements to help the body stabilize  While dizziness is present always use her cane whenever walking, always have shoes with grips and turn on lights to help prevent injury  If your symptoms continue to persist or worsen at any point please follow-up with urgent care or your primary doctor for reevaluation   ED Prescriptions     Medication Sig Dispense Auth. Provider   amoxicillin (AMOXIL) 500 MG capsule Take 1 capsule (500 mg total) by mouth 2 (two) times daily for 7 days. 14 capsule Donnette Macmullen R, NP   predniSONE (DELTASONE) 20 MG tablet Take 2 tablets (40 mg total) by mouth daily. 10 tablet Kamden Reber R, NP   ondansetron  (ZOFRAN-ODT) 4 MG disintegrating tablet Take 1 tablet (4 mg total) by mouth every 8 (eight) hours as needed for nausea or vomiting. 20 tablet Ananya Mccleese, Hansel Starling R, NP   meclizine (ANTIVERT) 12.5 MG tablet Take 1 tablet (12.5 mg total) by mouth 3 (three) times daily as needed for dizziness. 12 tablet Mirage Pfefferkorn, Elita Boone, NP      PDMP not reviewed this encounter.   Valinda Hoar, NP 04/12/23 [phone]

## 2023-04-17 ENCOUNTER — Other Ambulatory Visit: Payer: Self-pay

## 2023-04-17 ENCOUNTER — Emergency Department (HOSPITAL_COMMUNITY): Payer: Medicare HMO

## 2023-04-17 ENCOUNTER — Encounter (HOSPITAL_COMMUNITY): Payer: Self-pay

## 2023-04-17 ENCOUNTER — Observation Stay (HOSPITAL_COMMUNITY)
Admission: EM | Admit: 2023-04-17 | Discharge: 2023-04-21 | Disposition: A | Discharge status: Home Health | Payer: Medicare HMO | Attending: Internal Medicine | Admitting: Internal Medicine

## 2023-04-17 DIAGNOSIS — W19XXXA Unspecified fall, initial encounter: Principal | ICD-10-CM

## 2023-04-17 DIAGNOSIS — M25512 Pain in left shoulder: Secondary | ICD-10-CM | POA: Diagnosis present

## 2023-04-17 DIAGNOSIS — F32A Depression, unspecified: Secondary | ICD-10-CM | POA: Diagnosis present

## 2023-04-17 DIAGNOSIS — Q438 Other specified congenital malformations of intestine: Secondary | ICD-10-CM

## 2023-04-17 DIAGNOSIS — Z79890 Hormone replacement therapy (postmenopausal): Secondary | ICD-10-CM

## 2023-04-17 DIAGNOSIS — S0083XA Contusion of other part of head, initial encounter: Principal | ICD-10-CM | POA: Insufficient documentation

## 2023-04-17 DIAGNOSIS — Z90710 Acquired absence of both cervix and uterus: Secondary | ICD-10-CM

## 2023-04-17 DIAGNOSIS — I2510 Atherosclerotic heart disease of native coronary artery without angina pectoris: Secondary | ICD-10-CM | POA: Diagnosis present

## 2023-04-17 DIAGNOSIS — K219 Gastro-esophageal reflux disease without esophagitis: Secondary | ICD-10-CM | POA: Diagnosis present

## 2023-04-17 DIAGNOSIS — Z955 Presence of coronary angioplasty implant and graft: Secondary | ICD-10-CM

## 2023-04-17 DIAGNOSIS — I10 Essential (primary) hypertension: Secondary | ICD-10-CM | POA: Diagnosis present

## 2023-04-17 DIAGNOSIS — M797 Fibromyalgia: Secondary | ICD-10-CM | POA: Diagnosis present

## 2023-04-17 DIAGNOSIS — Z888 Allergy status to other drugs, medicaments and biological substances status: Secondary | ICD-10-CM

## 2023-04-17 DIAGNOSIS — K317 Polyp of stomach and duodenum: Secondary | ICD-10-CM

## 2023-04-17 DIAGNOSIS — Y9289 Other specified places as the place of occurrence of the external cause: Secondary | ICD-10-CM

## 2023-04-17 DIAGNOSIS — K921 Melena: Principal | ICD-10-CM

## 2023-04-17 DIAGNOSIS — S40021A Contusion of right upper arm, initial encounter: Secondary | ICD-10-CM | POA: Diagnosis present

## 2023-04-17 DIAGNOSIS — E2740 Unspecified adrenocortical insufficiency: Secondary | ICD-10-CM | POA: Diagnosis present

## 2023-04-17 DIAGNOSIS — D120 Benign neoplasm of cecum: Secondary | ICD-10-CM | POA: Diagnosis present

## 2023-04-17 DIAGNOSIS — Z9049 Acquired absence of other specified parts of digestive tract: Secondary | ICD-10-CM | POA: Insufficient documentation

## 2023-04-17 DIAGNOSIS — Z96641 Presence of right artificial hip joint: Secondary | ICD-10-CM | POA: Diagnosis present

## 2023-04-17 DIAGNOSIS — Z791 Long term (current) use of non-steroidal anti-inflammatories (NSAID): Secondary | ICD-10-CM | POA: Insufficient documentation

## 2023-04-17 DIAGNOSIS — D649 Anemia, unspecified: Secondary | ICD-10-CM

## 2023-04-17 DIAGNOSIS — E66813 Obesity, class 3: Secondary | ICD-10-CM

## 2023-04-17 DIAGNOSIS — Z833 Family history of diabetes mellitus: Secondary | ICD-10-CM

## 2023-04-17 DIAGNOSIS — I110 Hypertensive heart disease with heart failure: Secondary | ICD-10-CM | POA: Diagnosis present

## 2023-04-17 DIAGNOSIS — Z79899 Other long term (current) drug therapy: Secondary | ICD-10-CM

## 2023-04-17 DIAGNOSIS — S40011A Contusion of right shoulder, initial encounter: Secondary | ICD-10-CM | POA: Diagnosis present

## 2023-04-17 DIAGNOSIS — D62 Acute posthemorrhagic anemia: Secondary | ICD-10-CM | POA: Diagnosis present

## 2023-04-17 DIAGNOSIS — Z886 Allergy status to analgesic agent status: Secondary | ICD-10-CM

## 2023-04-17 DIAGNOSIS — K648 Other hemorrhoids: Secondary | ICD-10-CM | POA: Diagnosis present

## 2023-04-17 DIAGNOSIS — D126 Benign neoplasm of colon, unspecified: Secondary | ICD-10-CM

## 2023-04-17 DIAGNOSIS — Z8719 Personal history of other diseases of the digestive system: Secondary | ICD-10-CM

## 2023-04-17 DIAGNOSIS — K3189 Other diseases of stomach and duodenum: Secondary | ICD-10-CM | POA: Diagnosis present

## 2023-04-17 DIAGNOSIS — D6832 Hemorrhagic disorder due to extrinsic circulating anticoagulants: Secondary | ICD-10-CM | POA: Diagnosis present

## 2023-04-17 DIAGNOSIS — D122 Benign neoplasm of ascending colon: Secondary | ICD-10-CM | POA: Diagnosis present

## 2023-04-17 DIAGNOSIS — E039 Hypothyroidism, unspecified: Secondary | ICD-10-CM | POA: Diagnosis present

## 2023-04-17 DIAGNOSIS — F419 Anxiety disorder, unspecified: Secondary | ICD-10-CM | POA: Diagnosis present

## 2023-04-17 DIAGNOSIS — E559 Vitamin D deficiency, unspecified: Secondary | ICD-10-CM | POA: Diagnosis present

## 2023-04-17 DIAGNOSIS — Z85828 Personal history of other malignant neoplasm of skin: Secondary | ICD-10-CM

## 2023-04-17 DIAGNOSIS — S0011XA Contusion of right eyelid and periocular area, initial encounter: Secondary | ICD-10-CM | POA: Diagnosis present

## 2023-04-17 DIAGNOSIS — I509 Heart failure, unspecified: Secondary | ICD-10-CM | POA: Insufficient documentation

## 2023-04-17 DIAGNOSIS — Z7901 Long term (current) use of anticoagulants: Secondary | ICD-10-CM | POA: Insufficient documentation

## 2023-04-17 DIAGNOSIS — Z86718 Personal history of other venous thrombosis and embolism: Secondary | ICD-10-CM

## 2023-04-17 DIAGNOSIS — Z885 Allergy status to narcotic agent status: Secondary | ICD-10-CM

## 2023-04-17 DIAGNOSIS — Z880 Allergy status to penicillin: Secondary | ICD-10-CM

## 2023-04-17 DIAGNOSIS — Z7951 Long term (current) use of inhaled steroids: Secondary | ICD-10-CM | POA: Insufficient documentation

## 2023-04-17 DIAGNOSIS — F411 Generalized anxiety disorder: Secondary | ICD-10-CM

## 2023-04-17 DIAGNOSIS — Z8711 Personal history of peptic ulcer disease: Secondary | ICD-10-CM

## 2023-04-17 DIAGNOSIS — M810 Age-related osteoporosis without current pathological fracture: Secondary | ICD-10-CM | POA: Diagnosis present

## 2023-04-17 DIAGNOSIS — I5032 Chronic diastolic (congestive) heart failure: Secondary | ICD-10-CM | POA: Diagnosis present

## 2023-04-17 DIAGNOSIS — Z91040 Latex allergy status: Secondary | ICD-10-CM

## 2023-04-17 DIAGNOSIS — E739 Lactose intolerance, unspecified: Secondary | ICD-10-CM | POA: Diagnosis present

## 2023-04-17 DIAGNOSIS — J449 Chronic obstructive pulmonary disease, unspecified: Secondary | ICD-10-CM | POA: Diagnosis present

## 2023-04-17 DIAGNOSIS — M910 Juvenile osteochondrosis of pelvis: Secondary | ICD-10-CM | POA: Insufficient documentation

## 2023-04-17 DIAGNOSIS — Z8249 Family history of ischemic heart disease and other diseases of the circulatory system: Secondary | ICD-10-CM

## 2023-04-17 DIAGNOSIS — Z6841 Body Mass Index (BMI) 40.0 and over, adult: Secondary | ICD-10-CM

## 2023-04-17 DIAGNOSIS — K529 Noninfective gastroenteritis and colitis, unspecified: Secondary | ICD-10-CM | POA: Diagnosis present

## 2023-04-17 DIAGNOSIS — Z881 Allergy status to other antibiotic agents status: Secondary | ICD-10-CM

## 2023-04-17 DIAGNOSIS — K449 Diaphragmatic hernia without obstruction or gangrene: Secondary | ICD-10-CM | POA: Diagnosis present

## 2023-04-17 DIAGNOSIS — E785 Hyperlipidemia, unspecified: Secondary | ICD-10-CM | POA: Diagnosis present

## 2023-04-17 DIAGNOSIS — W06XXXA Fall from bed, initial encounter: Secondary | ICD-10-CM | POA: Diagnosis present

## 2023-04-17 DIAGNOSIS — R42 Dizziness and giddiness: Secondary | ICD-10-CM | POA: Insufficient documentation

## 2023-04-17 DIAGNOSIS — T45515A Adverse effect of anticoagulants, initial encounter: Secondary | ICD-10-CM | POA: Diagnosis present

## 2023-04-17 DIAGNOSIS — Z981 Arthrodesis status: Secondary | ICD-10-CM

## 2023-04-17 DIAGNOSIS — K922 Gastrointestinal hemorrhage, unspecified: Secondary | ICD-10-CM | POA: Diagnosis present

## 2023-04-17 DIAGNOSIS — Z87891 Personal history of nicotine dependence: Secondary | ICD-10-CM

## 2023-04-17 LAB — URINALYSIS, W/ REFLEX TO CULTURE (INFECTION SUSPECTED)
Bacteria, UA: NONE SEEN
Bilirubin Urine: NEGATIVE
Glucose, UA: NEGATIVE mg/dL
Hgb urine dipstick: NEGATIVE
Ketones, ur: NEGATIVE mg/dL
Leukocytes,Ua: NEGATIVE
Nitrite: NEGATIVE
Protein, ur: NEGATIVE mg/dL
Specific Gravity, Urine: 1.01 (ref 1.005–1.030)
pH: 7 (ref 5.0–8.0)

## 2023-04-17 LAB — TSH: TSH: 0.222 u[IU]/mL — ABNORMAL LOW (ref 0.350–4.500)

## 2023-04-17 LAB — CBC WITH DIFFERENTIAL/PLATELET
Abs Immature Granulocytes: 0.05 10*3/uL (ref 0.00–0.07)
Basophils Absolute: 0.1 10*3/uL (ref 0.0–0.1)
Basophils Relative: 1 %
Eosinophils Absolute: 0.1 10*3/uL (ref 0.0–0.5)
Eosinophils Relative: 1 %
HCT: 19.6 % — ABNORMAL LOW (ref 36.0–46.0)
Hemoglobin: 5.5 g/dL — CL (ref 12.0–15.0)
Immature Granulocytes: 1 %
Lymphocytes Relative: 19 %
Lymphs Abs: 1.7 10*3/uL (ref 0.7–4.0)
MCH: 23.4 pg — ABNORMAL LOW (ref 26.0–34.0)
MCHC: 28.1 g/dL — ABNORMAL LOW (ref 30.0–36.0)
MCV: 83.4 fL (ref 80.0–100.0)
Monocytes Absolute: 0.9 10*3/uL (ref 0.1–1.0)
Monocytes Relative: 10 %
Neutro Abs: 6 10*3/uL (ref 1.7–7.7)
Neutrophils Relative %: 68 %
Platelets: 332 10*3/uL (ref 150–400)
RBC: 2.35 MIL/uL — ABNORMAL LOW (ref 3.87–5.11)
RDW: 17.6 % — ABNORMAL HIGH (ref 11.5–15.5)
WBC: 8.8 10*3/uL (ref 4.0–10.5)
nRBC: 0.3 % — ABNORMAL HIGH (ref 0.0–0.2)

## 2023-04-17 LAB — COMPREHENSIVE METABOLIC PANEL
ALT: 13 U/L (ref 0–44)
AST: 12 U/L — ABNORMAL LOW (ref 15–41)
Albumin: 3.1 g/dL — ABNORMAL LOW (ref 3.5–5.0)
Alkaline Phosphatase: 55 U/L (ref 38–126)
Anion gap: 9 (ref 5–15)
BUN: 18 mg/dL (ref 8–23)
CO2: 26 mmol/L (ref 22–32)
Calcium: 8.3 mg/dL — ABNORMAL LOW (ref 8.9–10.3)
Chloride: 101 mmol/L (ref 98–111)
Creatinine, Ser: 0.81 mg/dL (ref 0.44–1.00)
GFR, Estimated: >60 mL/min (ref >60)
Glucose, Bld: 97 mg/dL (ref 70–99)
Potassium: 3.8 mmol/L (ref 3.5–5.1)
Sodium: 136 mmol/L (ref 135–145)
Total Bilirubin: 0.7 mg/dL (ref 0.3–1.2)
Total Protein: 6 g/dL — ABNORMAL LOW (ref 6.5–8.1)

## 2023-04-17 LAB — PREPARE RBC (CROSSMATCH)

## 2023-04-17 LAB — LIPASE, BLOOD: Lipase: 23 U/L (ref 11–51)

## 2023-04-17 LAB — TROPONIN I (HIGH SENSITIVITY)
Troponin I (High Sensitivity): 8 ng/L (ref <18)
Troponin I (High Sensitivity): 9 ng/L (ref <18)

## 2023-04-17 LAB — POC OCCULT BLOOD, ED: Fecal Occult Bld: POSITIVE — AB

## 2023-04-17 LAB — LACTIC ACID, PLASMA: Lactic Acid, Venous: 0.9 mmol/L (ref 0.5–1.9)

## 2023-04-17 LAB — BRAIN NATRIURETIC PEPTIDE: B Natriuretic Peptide: 339.5 pg/mL — ABNORMAL HIGH (ref 0.0–100.0)

## 2023-04-17 MED ORDER — SODIUM CHLORIDE 0.9% IV SOLUTION: Freq: Once | INTRAVENOUS | Status: AC

## 2023-04-17 MED ORDER — ACETAMINOPHEN 325 MG PO TABS
650.0000 mg | ORAL_TABLET | Freq: Once | ORAL | Status: AC
  Administered 2023-04-17: 650 mg via ORAL
  Filled 2023-04-17: qty 2

## 2023-04-17 MED ORDER — IOHEXOL 350 MG/ML SOLN
75.0000 mL | Freq: Once | INTRAVENOUS | Status: AC | PRN
  Administered 2023-04-17: 75 mL via INTRAVENOUS

## 2023-04-17 NOTE — ED Triage Notes (Signed)
Fall on thinners from home. On Eliquis. Rolled out of bed to the ground at 4AM. Pt hit head. Intermittent dizziness and nausea. Hematoma to right eye. Alert and oriented x 4.

## 2023-04-17 NOTE — ED Provider Notes (Signed)
[place] EMERGENCY DEPARTMENT AT [facility] Provider Note   CSN: [id number] Arrival date & time: 04/17/23  1643     History  Chief Complaint  Patient presents with   Stacy Allen    Stacy Allen is a 82 y.o. female.  The history is provided by the patient, medical records and the EMS personnel. No language interpreter was used.  Fall This is a new problem. The current episode started 12 to 24 hours ago. The problem occurs rarely. The problem has not changed since onset.Associated symptoms include chest pain, abdominal pain, headaches and shortness of breath. Nothing aggravates the symptoms. Nothing relieves the symptoms. She has tried nothing for the symptoms. The treatment provided no relief.       Home Medications Prior to Admission medications   Medication Sig Start Date End Date Taking? Authorizing Provider  acetaminophen (TYLENOL) 500 MG tablet Take 500 mg by mouth every 6 (six) hours as needed.    [provider]  albuterol (PROVENTIL HFA;VENTOLIN HFA) 108 (90 BASE) MCG/ACT inhaler Inhale 2 puffs into the lungs.    [provider]  amLODipine (NORVASC) 10 MG tablet Take 0.5 tablets (5 mg total) by mouth daily. 10/23/21   Lurene Shadow, MD  amoxicillin (AMOXIL) 500 MG capsule Take 1 capsule (500 mg total) by mouth 2 (two) times daily for 7 days. 04/12/23 04/19/23  Valinda Hoar, NP  apixaban (ELIQUIS) 5 MG TABS tablet Take 1 tablet (5 mg total) by mouth 2 (two) times daily. Take 2 tablets (10mg ) twice daily for 7 days, then 1 tablet (5mg ) twice daily 10/23/21   Lurene Shadow, MD  citalopram (CELEXA) 40 MG tablet Take 40 mg by mouth daily.    [provider]  clonazePAM (KLONOPIN) 1 MG tablet Take 1 mg by mouth 2 (two) times daily. As needed for anxiety    [provider]  EPINEPHrine 0.3 mg/0.3 mL IJ SOAJ injection Inject 0.3 mg into the muscle once. 04/20/20   [provider]  gabapentin (NEURONTIN) 600 MG tablet Take  600 mg by mouth 3 (three) times daily. 04/24/20   [provider]  hydrOXYzine (ATARAX) 25 MG tablet Take 1 tablet (25 mg total) by mouth every 6 (six) hours. 01/18/23   Valinda Hoar, NP  levothyroxine (SYNTHROID) 150 MCG tablet Take 150 mcg by mouth daily. 09/15/21   [provider]  meclizine (ANTIVERT) 12.5 MG tablet Take 1 tablet (12.5 mg total) by mouth 3 (three) times daily as needed for dizziness. 04/12/23   Valinda Hoar, NP  metoprolol tartrate (LOPRESSOR) 25 MG tablet Take 1 tablet (25 mg total) by mouth daily. 10/23/21   Lurene Shadow, MD  omeprazole (PRILOSEC) 20 MG capsule Take 40 mg by mouth daily.     [provider]  ondansetron (ZOFRAN-ODT) 4 MG disintegrating tablet Take 1 tablet (4 mg total) by mouth every 8 (eight) hours as needed for nausea or vomiting. 11/27/22   Corena Herter, MD  ondansetron (ZOFRAN-ODT) 4 MG disintegrating tablet Take 1 tablet (4 mg total) by mouth every 8 (eight) hours as needed for nausea or vomiting. 04/12/23   White, Elita Boone, NP  pantoprazole (PROTONIX) 40 MG tablet Take 1 tablet (40 mg total) by mouth 2 (two) times daily. 11/27/22 01/19/23  Corena Herter, MD  Polyethyl Glycol-Propyl Glycol (SYSTANE) 0.4-0.3 % GEL ophthalmic gel Place 1 application into both eyes 2 (two) times daily as needed (dry eyes).    [provider]  predniSONE (DELTASONE)  20 MG tablet Take 2 tablets (40 mg total) by mouth daily. 04/12/23   White, Elita Boone, NP  loratadine (CLARITIN) 10 MG tablet Take 1 tablet (10 mg total) by mouth daily. Take 1 tablet in the morning. As needed for itching. 02/14/17 02/17/19  Hassan Rowan, MD  ranitidine (ZANTAC) 150 MG capsule Take 1 capsule (150 mg total) by mouth 2 (two) times daily. 02/14/17 02/17/19  Hassan Rowan, MD      Allergies    Azithromycin, Meloxicam, Morphine, Adhesive [tape], Codeine, Lactose intolerance (gi), Red dye #40 (allura red), Shellfish-derived products, Aspirin, Latex, Lodine  [etodolac], Morphine and codeine, Penicillins, and Pravastatin    Review of Systems   Review of Systems  Constitutional:  Positive for fatigue. Negative for chills and fever.  HENT:  Negative for congestion.   Eyes:  Negative for visual disturbance.  Respiratory:  Positive for shortness of breath. Negative for cough, chest tightness and wheezing.   Cardiovascular:  Positive for chest pain and leg swelling (chyronic). Negative for palpitations.  Gastrointestinal:  Positive for abdominal pain and nausea. Negative for constipation, diarrhea and vomiting.  Genitourinary:  Negative for dysuria and flank pain.  Musculoskeletal:  Negative for back pain, neck pain and neck stiffness.  Skin:  Negative for rash and wound.  Neurological:  Positive for light-headedness and headaches. Negative for seizures, weakness and numbness.  Psychiatric/Behavioral:  Negative for agitation and confusion.   All other systems reviewed and are negative.   Physical Exam Updated Vital Signs BP (!) 139/56   Pulse 72   Temp 98.5 F (36.9 C) (Oral)   Resp 15   Ht 5\' 3"  (1.6 m)   Wt 108 kg   SpO2 96%   BMI 42.18 kg/m  Physical Exam Vitals and nursing note reviewed.  Constitutional:      General: She is not in acute distress.    Appearance: She is well-developed. She is not ill-appearing, toxic-appearing or diaphoretic.  HENT:     Nose: No congestion or rhinorrhea.     Mouth/Throat:     Mouth: Mucous membranes are moist.     Pharynx: No oropharyngeal exudate or posterior oropharyngeal erythema.  Eyes:     Extraocular Movements: Extraocular movements intact.     Conjunctiva/sclera: Conjunctivae normal.     Pupils: Pupils are equal, round, and reactive to light.  Cardiovascular:     Rate and Rhythm: Normal rate and regular rhythm.     Heart sounds: No murmur heard. Pulmonary:     Effort: Pulmonary effort is normal. No respiratory distress.     Breath sounds: Normal breath sounds. No wheezing, rhonchi or  rales.  Chest:     Chest wall: Tenderness present.  Abdominal:     General: Abdomen is flat.     Palpations: Abdomen is soft.     Tenderness: There is abdominal tenderness. There is no right CVA tenderness, left CVA tenderness, guarding or rebound.  Musculoskeletal:        General: Tenderness present. No swelling.     Cervical back: Neck supple. Tenderness present.  Skin:    General: Skin is warm and dry.     Capillary Refill: Capillary refill takes less than 2 seconds.     Findings: Bruising present. No rash.  Neurological:     General: No focal deficit present.     Mental Status: She is alert.     Sensory: No sensory deficit.     Motor: No weakness.  Psychiatric:  Mood and Affect: Mood normal.     ED Results / Procedures / Treatments   Labs (all labs ordered are listed, but only abnormal results are displayed) Labs Reviewed  URINALYSIS, W/ REFLEX TO CULTURE (INFECTION SUSPECTED) - Abnormal; Notable for the following components:      Result Value   Color, Urine STRAW (*)    All other components within normal limits  TSH - Abnormal; Notable for the following components:   TSH 0.222 (*)    All other components within normal limits  BRAIN NATRIURETIC PEPTIDE - Abnormal; Notable for the following components:   B Natriuretic Peptide 339.5 (*)    All other components within normal limits  COMPREHENSIVE METABOLIC PANEL - Abnormal; Notable for the following components:   Calcium 8.3 (*)    Total Protein 6.0 (*)    Albumin 3.1 (*)    AST 12 (*)    All other components within normal limits  CBC WITH DIFFERENTIAL/PLATELET - Abnormal; Notable for the following components:   RBC 2.35 (*)    Hemoglobin 5.5 (*)    HCT 19.6 (*)    MCH 23.4 (*)    MCHC 28.1 (*)    RDW 17.6 (*)    nRBC 0.3 (*)    All other components within normal limits  POC OCCULT BLOOD, ED - Abnormal; Notable for the following components:   Fecal Occult Bld POSITIVE (*)    All other components within  normal limits  URINE CULTURE  LIPASE, BLOOD  LACTIC ACID, PLASMA  CBC WITH DIFFERENTIAL/PLATELET  PREPARE RBC (CROSSMATCH)  TYPE AND SCREEN  TROPONIN I (HIGH SENSITIVITY)  TROPONIN I (HIGH SENSITIVITY)    EKG None  Radiology CT CHEST ABDOMEN PELVIS W CONTRAST  Result Date: 04/17/2023 CLINICAL DATA:  Polytrauma, blunt Fall elevated on Eliquis this morning, near syncope for last 3 weeks. Left lower chest pain/left upper abdominal pain. Right facial injury. EXAM: CT CHEST, ABDOMEN, AND PELVIS WITH CONTRAST TECHNIQUE: Multidetector CT imaging of the chest, abdomen and pelvis was performed following the standard protocol during bolus administration of intravenous contrast. RADIATION DOSE REDUCTION: This exam was performed according to the departmental dose-optimization program which includes automated exposure control, adjustment of the mA and/or kV according to patient size and/or use of iterative reconstruction technique. CONTRAST:  75mL OMNIPAQUE IOHEXOL 350 MG/ML SOLN COMPARISON:  None Available. FINDINGS: CHEST: Cardiovascular: No aortic injury. The thoracic aorta is normal in caliber. The heart is enlarged in size. No significant pericardial effusion. At least 3 vessel coronary calcification. Mild atherosclerotic plaque. Mediastinum/Nodes: No pneumomediastinum. No mediastinal hematoma. The esophagus is unremarkable. Large volume hiatal hernia containing the majority of the stomach. The thyroid is unremarkable. The central airways are patent. No mediastinal, hilar, or axillary lymphadenopathy. Lungs/Pleura: No focal consolidation. No pulmonary nodule. No pulmonary mass. No pulmonary contusion or laceration. No pneumatocele formation. Bilateral trace, right greater than left, pleural effusions. No pneumothorax. No hemothorax. Musculoskeletal/Chest wall: No chest wall mass. No acute rib or sternal fracture. No spinal fracture. Old healed left rib fractures. ABDOMEN / PELVIS: Hepatobiliary: Not  enlarged. No focal lesion. No laceration or subcapsular hematoma. Status post cholecystectomy.  No biliary ductal dilatation. Pancreas: Atrophic parenchyma. Otherwise normal pancreatic contour. No main pancreatic duct dilatation. Spleen: Not enlarged. No focal lesion. No laceration, subcapsular hematoma, or vascular injury. Adrenals/Urinary Tract: No nodularity bilaterally. Bilateral kidneys enhance symmetrically. No hydronephrosis. No contusion, laceration, or subcapsular hematoma. No injury to the vascular structures or collecting systems. No hydroureter. The urinary bladder  is unremarkable. Stomach/Bowel: No small or large bowel wall thickening or dilatation. Status post appendectomy. Vasculature/Lymphatics: Moderate atherosclerotic plaque. No abdominal aorta or iliac aneurysm. No active contrast extravasation or pseudoaneurysm. No abdominal, pelvic, inguinal lymphadenopathy. Reproductive: Status post hysterectomy with limited evaluation due to streak artifact originating from the right femoral surgical hardware. Other: No simple free fluid ascites. No pneumoperitoneum. No hemoperitoneum. No mesenteric hematoma identified. No organized fluid collection. Musculoskeletal: No significant soft tissue hematoma. No acute pelvic fracture. No spinal fracture. L2-L4 poor posterolateral and interbody fusion surgical hardware. Status post total right hip arthroplasty. 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 Ports and Devices: None. IMPRESSION: 1. No acute intrathoracic, intra-abdominal, intrapelvic traumatic injury. 2. No acute fracture or traumatic malalignment of the thoracic or lumbar spine. Other imaging findings of potential clinical significance: 1. Cardiomegaly. 2. Bilateral trace, right greater than left, pleural effusions. 3. Large volume hiatal hernia containing the majority of the stomach. 4.  Aortic Atherosclerosis (ICD10-I70.0). Electronically Signed   By: Tish Frederickson M.D.   On: 04/17/2023  20:12   CT HEAD WO CONTRAST ( )  Result Date: 04/17/2023 CLINICAL DATA:  Polytrauma, blunt; Facial trauma, blunt Fall elevated on Eliquis this morning, near syncope for last 3 weeks. Left lower chest pain/left upper abdominal pain. Right facial injury. EXAM: CT HEAD WITHOUT CONTRAST CT MAXILLOFACIAL WITHOUT CONTRAST CT CERVICAL SPINE WITHOUT CONTRAST TECHNIQUE: Multidetector CT imaging of the head, cervical spine, and maxillofacial structures were performed using the standard protocol without intravenous contrast. Multiplanar CT image reconstructions of the cervical spine and maxillofacial structures were also generated. RADIATION DOSE REDUCTION: This exam was performed according to the departmental dose-optimization program which includes automated exposure control, adjustment of the mA and/or kV according to patient size and/or use of iterative reconstruction technique. COMPARISON:  None Available. FINDINGS: CT HEAD FINDINGS Brain: Cerebral ventricle sizes are concordant with the degree of cerebral volume loss. No evidence of large-territorial acute infarction. No parenchymal hemorrhage. No mass lesion. No extra-axial collection. No mass effect or midline shift. No hydrocephalus. Basilar cisterns are patent. Vascular: No hyperdense vessel. Skull: No acute fracture or focal lesion. Other: A 1.3 cm right periorbital/frontal scalp hematoma formation. CT MAXILLOFACIAL FINDINGS Osseous: No fracture or mandibular dislocation. No destructive process. Sinuses/Orbits: Surgical changes of the sinuses. Paranasal sinuses and mastoid air cells are clear. Bilateral lens replacement. Otherwise the orbits are unremarkable. Soft tissues: Negative. CT CERVICAL SPINE FINDINGS Alignment: Reversal of the normal cervical lordosis centered at the C5-C6 level likely due to positioning. Skull base and vertebrae: Multilevel degenerative changes of the spine most prominent at the C5 through C7 levels. No acute fracture. No aggressive  appearing focal osseous lesion or focal pathologic process. Soft tissues and spinal canal: No prevertebral fluid or swelling. No visible canal hematoma. Upper chest: Unremarkable. Other: None. IMPRESSION: 1. No acute intracranial abnormality. 2.  No acute displaced facial fracture. 3. No acute displaced fracture or traumatic listhesis of the cervical spine. Electronically Signed   By: Tish Frederickson M.D.   On: 04/17/2023 19:54   CT Cervical Spine Wo Contrast  Result Date: 04/17/2023 CLINICAL DATA:  Polytrauma, blunt; Facial trauma, blunt Fall elevated on Eliquis this morning, near syncope for last 3 weeks. Left lower chest pain/left upper abdominal pain. Right facial injury. EXAM: CT HEAD WITHOUT CONTRAST CT MAXILLOFACIAL WITHOUT CONTRAST CT CERVICAL SPINE WITHOUT CONTRAST TECHNIQUE: Multidetector CT imaging of the head, cervical spine, and maxillofacial structures were performed using the standard protocol without intravenous contrast. Multiplanar CT  image reconstructions of the cervical spine and maxillofacial structures were also generated. RADIATION DOSE REDUCTION: This exam was performed according to the departmental dose-optimization program which includes automated exposure control, adjustment of the mA and/or kV according to patient size and/or use of iterative reconstruction technique. COMPARISON:  None Available. FINDINGS: CT HEAD FINDINGS Brain: Cerebral ventricle sizes are concordant with the degree of cerebral volume loss. No evidence of large-territorial acute infarction. No parenchymal hemorrhage. No mass lesion. No extra-axial collection. No mass effect or midline shift. No hydrocephalus. Basilar cisterns are patent. Vascular: No hyperdense vessel. Skull: No acute fracture or focal lesion. Other: A 1.3 cm right periorbital/frontal scalp hematoma formation. CT MAXILLOFACIAL FINDINGS Osseous: No fracture or mandibular dislocation. No destructive process. Sinuses/Orbits: Surgical changes of the  sinuses. Paranasal sinuses and mastoid air cells are clear. Bilateral lens replacement. Otherwise the orbits are unremarkable. Soft tissues: Negative. CT CERVICAL SPINE FINDINGS Alignment: Reversal of the normal cervical lordosis centered at the C5-C6 level likely due to positioning. Skull base and vertebrae: Multilevel degenerative changes of the spine most prominent at the C5 through C7 levels. No acute fracture. No aggressive appearing focal osseous lesion or focal pathologic process. Soft tissues and spinal canal: No prevertebral fluid or swelling. No visible canal hematoma. Upper chest: Unremarkable. Other: None. IMPRESSION: 1. No acute intracranial abnormality. 2.  No acute displaced facial fracture. 3. No acute displaced fracture or traumatic listhesis of the cervical spine. Electronically Signed   By: Tish Frederickson M.D.   On: 04/17/2023 19:54   CT Maxillofacial Wo Contrast  Result Date: 04/17/2023 CLINICAL DATA:  Polytrauma, blunt; Facial trauma, blunt Fall elevated on Eliquis this morning, near syncope for last 3 weeks. Left lower chest pain/left upper abdominal pain. Right facial injury. EXAM: CT HEAD WITHOUT CONTRAST CT MAXILLOFACIAL WITHOUT CONTRAST CT CERVICAL SPINE WITHOUT CONTRAST TECHNIQUE: Multidetector CT imaging of the head, cervical spine, and maxillofacial structures were performed using the standard protocol without intravenous contrast. Multiplanar CT image reconstructions of the cervical spine and maxillofacial structures were also generated. RADIATION DOSE REDUCTION: This exam was performed according to the departmental dose-optimization program which includes automated exposure control, adjustment of the mA and/or kV according to patient size and/or use of iterative reconstruction technique. COMPARISON:  None Available. FINDINGS: CT HEAD FINDINGS Brain: Cerebral ventricle sizes are concordant with the degree of cerebral volume loss. No evidence of large-territorial acute infarction.  No parenchymal hemorrhage. No mass lesion. No extra-axial collection. No mass effect or midline shift. No hydrocephalus. Basilar cisterns are patent. Vascular: No hyperdense vessel. Skull: No acute fracture or focal lesion. Other: A 1.3 cm right periorbital/frontal scalp hematoma formation. CT MAXILLOFACIAL FINDINGS Osseous: No fracture or mandibular dislocation. No destructive process. Sinuses/Orbits: Surgical changes of the sinuses. Paranasal sinuses and mastoid air cells are clear. Bilateral lens replacement. Otherwise the orbits are unremarkable. Soft tissues: Negative. CT CERVICAL SPINE FINDINGS Alignment: Reversal of the normal cervical lordosis centered at the C5-C6 level likely due to positioning. Skull base and vertebrae: Multilevel degenerative changes of the spine most prominent at the C5 through C7 levels. No acute fracture. No aggressive appearing focal osseous lesion or focal pathologic process. Soft tissues and spinal canal: No prevertebral fluid or swelling. No visible canal hematoma. Upper chest: Unremarkable. Other: None. IMPRESSION: 1. No acute intracranial abnormality. 2.  No acute displaced facial fracture. 3. No acute displaced fracture or traumatic listhesis of the cervical spine. Electronically Signed   By: Tish Frederickson M.D.   On: 04/17/2023 19:54  DG Humerus Right  Result Date: 04/17/2023 CLINICAL DATA:  Near syncope, fall.  Rolled out of bed.  Pain. EXAM: RIGHT HUMERUS - 2+ VIEW COMPARISON:  None Available. FINDINGS: There is no evidence of fracture or other focal bone lesions. Cortical margins of the humerus are intact. Soft tissues are unremarkable. IMPRESSION: Negative radiographs of the right humerus. Electronically Signed   By: Narda Rutherford M.D.   On: 04/17/2023 17:29   DG Chest Portable 1 View  Result Date: 04/17/2023 CLINICAL DATA:  Syncope, fall. EXAM: PORTABLE CHEST 1 VIEW COMPARISON:  04/12/2023 FINDINGS: Stable cardiomegaly. Unchanged mediastinal contours.  Retrocardiac hiatal hernia, moderate to large in size. Slight vascular congestion without edema. No pleural effusion or pneumothorax. No acute osseous findings. IMPRESSION: 1. Cardiomegaly with slight vascular congestion. 2. Moderate to large hiatal hernia. Electronically Signed   By: Narda Rutherford M.D.   On: 04/17/2023 17:28    Procedures Procedures    CRITICAL CARE Performed by: Canary Brim Binh Doten Total critical care time: 40 minutes Critical care time was exclusive of separately billable procedures and treating other patients. Critical care was necessary to treat or prevent imminent or life-threatening deterioration. Critical care was time spent personally by me on the following activities: development of treatment plan with patient and/or surrogate as well as nursing, discussions with consultants, evaluation of patient's response to treatment, examination of patient, obtaining history from patient or surrogate, ordering and performing treatments and interventions, ordering and review of laboratory studies, ordering and review of radiographic studies, pulse oximetry and re-evaluation of patient's condition.   Medications Ordered in ED Medications  0.9 %  sodium chloride infusion (Manually program via Guardrails IV Fluids) (has no administration in time range)  iohexol (OMNIPAQUE) 350 MG/ML injection 75 mL (75 mLs Intravenous Contrast Given 04/17/23 1900)  acetaminophen (TYLENOL) tablet 650 mg (650 mg Oral Given 04/17/23 2037)    ED Course/ Medical Decision Making/ A&P                                 Medical Decision Making Amount and/or Complexity of Data Reviewed Labs: ordered. Radiology: ordered.  Risk OTC drugs. Prescription drug management. Decision regarding hospitalization.    SHELISHA WARDROP is a 82 y.o. female with a past medical history significant for previous hysterectomy, previous cholecystectomy, previous rectocele repair, CAD, COPD , previous DVT on Eliquis,  hypertension, hyperlipidemia, CHF, osteoporosis, and fibromyalgia who presents with 3 weeks of episodes of lightheadedness, fatigue, near syncope followed by a fall for him this morning hitting her head.  Patient reports over the last 3 weeks she has been having fatigue and lightheaded episodes.  She reports she is nearly passed out multiple times but never actually lost consciousness.  She reports she has had no energy and has also had some worsened exertional shortness of breath at times.  Denies any leg pain or leg swelling and denies any chest pain or palpitations.  She reports that this morning she rolled out of bed about 4 AM and struck her right face on the linoleum floor.  She did not get knocked out but has been having pain in her head and neck.  She also has some pain in her left lower chest and her left abdomen.  Denies significant back pain.  She denies pain in her extremities other than her right upper arm having bruising and soreness.  She was on the ground for 30 minutes for being  able to get up.  She denies any other symptoms now.  She denies any fevers, chills, congestion, cough, vomiting, constipation, diarrhea, and reports some chronic intermittent urinary changes.  She reports she recently had UTI and completed antibiotics.  She denies diarrhea.  She does states she has had some nausea with her facial injury.  She is on Eliquis for previous blood clot.  On exam, lungs were clear.  Chest was tender in the left lower chest.  Left abdomen was tender to palpation but bowel sounds were appreciated.  She is intact sensation and strength in extremities.  To mild edema in legs he reports is chronic.  Significant bruising to her right orbit area and right face.  No nasal septal hematoma seen.  Oropharyngeal exam unremarkable.  Pupils symmetric and reactive with normal extract movements without evidence of entrapment.  Some tenderness in her right neck.  Will get CT imaging of her head neck and face  but will also get CT imaging of the chest/ab/pelvis given her Eliquis use and the fall.  Will get other workup to look for occult infection other causes of her fatigue and lightheadedness for the last 2 weeks as well as exertional shortness of breath.  Will get a BNP.   Anticipate reassessment after workup to determine disposition.  Fecal occult test is positive.  The patient reports she has been seen previously at [facility] for [facility].  After CT imaging completed, she will need admission for symptomatic anemia from GI bleed on Eliquis.  CT imaging did not show evidence of significant traumatic injuries.  Showed some cardiomegaly and some pleural effusions and known hiatal hernia.  Patient does says she does not dark stools for the last few months.   Will call for admission for further management.  Medicine will contact GI team for further management in the morning.        Final Clinical Impression(s) / ED Diagnoses Final diagnoses:  Fall, initial encounter  Symptomatic anemia  Gastrointestinal hemorrhage, unspecified gastrointestinal hemorrhage type    Rx / DC Orders ED Discharge Orders     None       Clinical Impression: 1. Fall, initial encounter   2. Symptomatic anemia   3. Gastrointestinal hemorrhage, unspecified gastrointestinal hemorrhage type     Disposition: Admit  This note was prepared with assistance of Dragon voice recognition software. Occasional wrong-word or sound-a-like substitutions may have occurred due to the inherent limitations of voice recognition software.     Josephine Rudnick, Canary Brim, MD 04/17/23 [phone]

## 2023-04-17 NOTE — ED Notes (Signed)
Pt placed in hospital bed

## 2023-04-18 ENCOUNTER — Other Ambulatory Visit: Payer: Self-pay

## 2023-04-18 DIAGNOSIS — E039 Hypothyroidism, unspecified: Secondary | ICD-10-CM | POA: Diagnosis present

## 2023-04-18 DIAGNOSIS — F419 Anxiety disorder, unspecified: Secondary | ICD-10-CM | POA: Diagnosis not present

## 2023-04-18 DIAGNOSIS — R008 Other abnormalities of heart beat: Secondary | ICD-10-CM | POA: Diagnosis not present

## 2023-04-18 DIAGNOSIS — I10 Essential (primary) hypertension: Secondary | ICD-10-CM | POA: Diagnosis not present

## 2023-04-18 DIAGNOSIS — M797 Fibromyalgia: Secondary | ICD-10-CM | POA: Diagnosis present

## 2023-04-18 DIAGNOSIS — M810 Age-related osteoporosis without current pathological fracture: Secondary | ICD-10-CM | POA: Diagnosis present

## 2023-04-18 DIAGNOSIS — I110 Hypertensive heart disease with heart failure: Secondary | ICD-10-CM | POA: Diagnosis present

## 2023-04-18 DIAGNOSIS — D649 Anemia, unspecified: Secondary | ICD-10-CM

## 2023-04-18 DIAGNOSIS — S0011XA Contusion of right eyelid and periocular area, initial encounter: Secondary | ICD-10-CM | POA: Diagnosis present

## 2023-04-18 DIAGNOSIS — D6832 Hemorrhagic disorder due to extrinsic circulating anticoagulants: Secondary | ICD-10-CM | POA: Diagnosis present

## 2023-04-18 DIAGNOSIS — K921 Melena: Secondary | ICD-10-CM | POA: Diagnosis present

## 2023-04-18 DIAGNOSIS — I2510 Atherosclerotic heart disease of native coronary artery without angina pectoris: Secondary | ICD-10-CM | POA: Diagnosis present

## 2023-04-18 DIAGNOSIS — D62 Acute posthemorrhagic anemia: Secondary | ICD-10-CM | POA: Diagnosis present

## 2023-04-18 DIAGNOSIS — K922 Gastrointestinal hemorrhage, unspecified: Secondary | ICD-10-CM

## 2023-04-18 DIAGNOSIS — D126 Benign neoplasm of colon, unspecified: Secondary | ICD-10-CM | POA: Diagnosis not present

## 2023-04-18 DIAGNOSIS — Z86718 Personal history of other venous thrombosis and embolism: Secondary | ICD-10-CM | POA: Diagnosis not present

## 2023-04-18 DIAGNOSIS — E2740 Unspecified adrenocortical insufficiency: Secondary | ICD-10-CM | POA: Diagnosis present

## 2023-04-18 DIAGNOSIS — K219 Gastro-esophageal reflux disease without esophagitis: Secondary | ICD-10-CM | POA: Diagnosis present

## 2023-04-18 DIAGNOSIS — E785 Hyperlipidemia, unspecified: Secondary | ICD-10-CM | POA: Diagnosis present

## 2023-04-18 DIAGNOSIS — F32A Depression, unspecified: Secondary | ICD-10-CM | POA: Diagnosis present

## 2023-04-18 DIAGNOSIS — Z6841 Body Mass Index (BMI) 40.0 and over, adult: Secondary | ICD-10-CM | POA: Diagnosis not present

## 2023-04-18 DIAGNOSIS — K449 Diaphragmatic hernia without obstruction or gangrene: Secondary | ICD-10-CM | POA: Diagnosis present

## 2023-04-18 DIAGNOSIS — Q438 Other specified congenital malformations of intestine: Secondary | ICD-10-CM | POA: Diagnosis not present

## 2023-04-18 DIAGNOSIS — Z955 Presence of coronary angioplasty implant and graft: Secondary | ICD-10-CM | POA: Diagnosis not present

## 2023-04-18 DIAGNOSIS — Z8249 Family history of ischemic heart disease and other diseases of the circulatory system: Secondary | ICD-10-CM | POA: Diagnosis not present

## 2023-04-18 DIAGNOSIS — J449 Chronic obstructive pulmonary disease, unspecified: Secondary | ICD-10-CM | POA: Diagnosis present

## 2023-04-18 DIAGNOSIS — Z7901 Long term (current) use of anticoagulants: Secondary | ICD-10-CM | POA: Diagnosis not present

## 2023-04-18 DIAGNOSIS — W06XXXA Fall from bed, initial encounter: Secondary | ICD-10-CM | POA: Diagnosis present

## 2023-04-18 DIAGNOSIS — Y9289 Other specified places as the place of occurrence of the external cause: Secondary | ICD-10-CM | POA: Diagnosis not present

## 2023-04-18 DIAGNOSIS — I5032 Chronic diastolic (congestive) heart failure: Secondary | ICD-10-CM | POA: Diagnosis present

## 2023-04-18 DIAGNOSIS — S40021A Contusion of right upper arm, initial encounter: Secondary | ICD-10-CM | POA: Diagnosis present

## 2023-04-18 DIAGNOSIS — E739 Lactose intolerance, unspecified: Secondary | ICD-10-CM | POA: Diagnosis present

## 2023-04-18 DIAGNOSIS — K317 Polyp of stomach and duodenum: Secondary | ICD-10-CM | POA: Diagnosis not present

## 2023-04-18 DIAGNOSIS — F411 Generalized anxiety disorder: Secondary | ICD-10-CM

## 2023-04-18 HISTORY — DX: Anemia, unspecified: D64.9

## 2023-04-18 HISTORY — DX: Gastrointestinal hemorrhage, unspecified: K92.2

## 2023-04-18 HISTORY — DX: Long term (current) use of anticoagulants: Z79.01

## 2023-04-18 LAB — URINE CULTURE: Culture: <10000 — AB

## 2023-04-18 LAB — TYPE AND SCREEN
ABO/RH(D): A POS
Antibody Screen: NEGATIVE
Unit division: 0
Unit division: 0

## 2023-04-18 LAB — CBC
HCT: 26.1 % — ABNORMAL LOW (ref 36.0–46.0)
Hemoglobin: 7.9 g/dL — ABNORMAL LOW (ref 12.0–15.0)
MCH: 25.2 pg — ABNORMAL LOW (ref 26.0–34.0)
MCHC: 30.3 g/dL (ref 30.0–36.0)
MCV: 83.4 fL (ref 80.0–100.0)
Platelets: 271 10*3/uL (ref 150–400)
RBC: 3.13 MIL/uL — ABNORMAL LOW (ref 3.87–5.11)
RDW: 16.6 % — ABNORMAL HIGH (ref 11.5–15.5)
WBC: 7 10*3/uL (ref 4.0–10.5)
nRBC: 0.4 % — ABNORMAL HIGH (ref 0.0–0.2)

## 2023-04-18 LAB — BPAM RBC
Blood Product Expiration Date: 202411182359
Blood Product Expiration Date: 202411182359
ISSUE DATE / TIME: 202410212011
ISSUE DATE / TIME: 202410212011
Unit Type and Rh: 6200
Unit Type and Rh: 6200

## 2023-04-18 LAB — BASIC METABOLIC PANEL
Anion gap: 8 (ref 5–15)
BUN: 12 mg/dL (ref 8–23)
CO2: 27 mmol/L (ref 22–32)
Calcium: 8.3 mg/dL — ABNORMAL LOW (ref 8.9–10.3)
Chloride: 100 mmol/L (ref 98–111)
Creatinine, Ser: 0.72 mg/dL (ref 0.44–1.00)
GFR, Estimated: >60 mL/min (ref >60)
Glucose, Bld: 103 mg/dL — ABNORMAL HIGH (ref 70–99)
Potassium: 3.7 mmol/L (ref 3.5–5.1)
Sodium: 135 mmol/L (ref 135–145)

## 2023-04-18 LAB — HEMOGLOBIN AND HEMATOCRIT, BLOOD
HCT: 26.3 % — ABNORMAL LOW (ref 36.0–46.0)
Hemoglobin: 7.8 g/dL — ABNORMAL LOW (ref 12.0–15.0)

## 2023-04-18 LAB — PROTIME-INR
INR: 1.2 (ref 0.8–1.2)
Prothrombin Time: 15.5 s — ABNORMAL HIGH (ref 11.4–15.2)

## 2023-04-18 LAB — MAGNESIUM: Magnesium: 2.1 mg/dL (ref 1.7–2.4)

## 2023-04-18 LAB — BRAIN NATRIURETIC PEPTIDE: B Natriuretic Peptide: 295.2 pg/mL — ABNORMAL HIGH (ref 0.0–100.0)

## 2023-04-18 MED ORDER — METOPROLOL TARTRATE 25 MG PO TABS
25.0000 mg | ORAL_TABLET | Freq: Every day | ORAL | Status: DC
  Administered 2023-04-18 – 2023-04-21 (×4): 25 mg via ORAL
  Filled 2023-04-18 (×4): qty 1

## 2023-04-18 MED ORDER — OXYCODONE HCL 5 MG PO TABS
5.0000 mg | ORAL_TABLET | ORAL | Status: AC | PRN
  Administered 2023-04-18 – 2023-04-19 (×2): 5 mg via ORAL
  Filled 2023-04-18 (×2): qty 1

## 2023-04-18 MED ORDER — GABAPENTIN 300 MG PO CAPS
1200.0000 mg | ORAL_CAPSULE | Freq: Every day | ORAL | Status: DC
Start: 2023-04-18 — End: 2023-04-21
  Administered 2023-04-18 – 2023-04-20 (×3): 1200 mg via ORAL
  Filled 2023-04-18 (×3): qty 4

## 2023-04-18 MED ORDER — PANTOPRAZOLE SODIUM 40 MG IV SOLR
40.0000 mg | INTRAVENOUS | Status: DC
  Administered 2023-04-18: 40 mg via INTRAVENOUS
  Filled 2023-04-18: qty 10

## 2023-04-18 MED ORDER — ACETAMINOPHEN 500 MG PO TABS
500.0000 mg | ORAL_TABLET | Freq: Four times a day (QID) | ORAL | Status: DC | PRN
  Administered 2023-04-18 – 2023-04-21 (×7): 500 mg via ORAL
  Filled 2023-04-18 (×7): qty 1

## 2023-04-18 MED ORDER — GABAPENTIN 600 MG PO TABS: 600.0000 mg | ORAL_TABLET | ORAL | Status: DC

## 2023-04-18 MED ORDER — GABAPENTIN 300 MG PO CAPS
600.0000 mg | ORAL_CAPSULE | Freq: Every morning | ORAL | Status: DC
  Administered 2023-04-18 – 2023-04-21 (×4): 600 mg via ORAL
  Filled 2023-04-18 (×4): qty 2

## 2023-04-18 MED ORDER — AMLODIPINE BESYLATE 5 MG PO TABS
5.0000 mg | ORAL_TABLET | Freq: Every day | ORAL | Status: DC
  Administered 2023-04-18 – 2023-04-21 (×4): 5 mg via ORAL
  Filled 2023-04-18 (×4): qty 1

## 2023-04-18 MED ORDER — LEVOTHYROXINE SODIUM 150 MCG PO TABS
150.0000 ug | ORAL_TABLET | Freq: Every day | ORAL | Status: DC
  Administered 2023-04-18 – 2023-04-20 (×3): 150 ug via ORAL
  Filled 2023-04-18 (×3): qty 1
  Filled 2023-04-18 (×3): qty 2

## 2023-04-18 MED ORDER — PANTOPRAZOLE SODIUM 40 MG IV SOLR
40.0000 mg | Freq: Two times a day (BID) | INTRAVENOUS | Status: DC
  Administered 2023-04-18 – 2023-04-21 (×6): 40 mg via INTRAVENOUS
  Filled 2023-04-18 (×6): qty 10

## 2023-04-18 MED ORDER — CLONAZEPAM 0.5 MG PO TABS
1.0000 mg | ORAL_TABLET | Freq: Every day | ORAL | Status: DC
  Administered 2023-04-18 – 2023-04-20 (×4): 1 mg via ORAL
  Filled 2023-04-18 (×4): qty 2

## 2023-04-18 MED ORDER — CITALOPRAM HYDROBROMIDE 20 MG PO TABS
40.0000 mg | ORAL_TABLET | Freq: Every day | ORAL | Status: DC
  Administered 2023-04-18 – 2023-04-21 (×4): 40 mg via ORAL
  Filled 2023-04-18: qty 4
  Filled 2023-04-18 (×3): qty 2

## 2023-04-18 MED ORDER — HYDRALAZINE HCL 25 MG PO TABS: 25.0000 mg | ORAL_TABLET | Freq: Four times a day (QID) | ORAL | Status: DC | PRN

## 2023-04-18 NOTE — H&P (Addendum)
History and Physical    Patient: Stacy Allen DOA: 04/17/2023 DOS: the patient was seen and examined on 04/18/2023 PCP: Jodi Marble, NP  Patient coming from: Home  Chief Complaint:  Chief Complaint  Patient presents with   Fall   HPI: Stacy Allen is a 82 y.o. female with medical history significant of CAD with RCA stent, recurrent DVT on Eliquis, HTN COPD, hypothyroidism, C.diff , adrenal insufficiency, morbid obesity who presents with fall.  Pt reports that she was sleeping and rolled out of bed last night. She hit her face and has a bruise around her right eye. Told her family today and was advised to go to ED. For the past week she has also been experiencing dizziness. These symptoms occurred after she received 3 vaccinations for Shingles, Covid and flu. However has also noted 3 weeks of melena. About a month ago also had 2 episodes of hematemesis but attributes that to her large hiatal hernia since it has happened before. She last had endoscopy in June due to epigastric pain and found to have duodenitis and a 10cm hiatal hernia. Has been taking occasional NSAIDS. No alcohol use. She is on Eliquis for recurrent DVTs and last dose was prior to arrival to ED.   On arrival to ED, she was afebrile, normotensive on room air.   CBC without leukocytosis, Hgb of 5.5 from 9.9 in June. FOBT +  BMP otherwise unremarkable.   Pan-scan with CT head, cervica spine, maxillofacial, CT chest/abd/pelvis without any trauma.   2u pRBC was ordered to be transfused and hospitalist was consulted for admission.     Review of Systems: As mentioned in the history of present illness. All other systems reviewed and are negative. Past Medical History:  Diagnosis Date   Adenomatous polyps    Allergic state    Anemia    Anginal pain (HCC)    Anxiety    Arthritis    osteoarthritis   Broken rib    left   Cancer (HCC)    skin   CHF (congestive heart failure)  (HCC)    Chicken pox    Complication of anesthesia    respiratory distress after rectocele surgery   Coronary artery disease    Depression    Eczema    Fibromyalgia    GERD (gastroesophageal reflux disease)    Headache    migraines   Hemorrhoids    History of hiatal hernia    Hypercalcemia    Hyperlipidemia    Hypertension    Hypothyroidism    Lumbar stenosis    Obesity    Peptic ulcer disease    Shortness of breath dyspnea    Sleep apnea    No CPAP   Vertigo    Vitamin D deficiency    Past Surgical History:  Procedure Laterality Date   ABDOMINAL HYSTERECTOMY     BACK SURGERY  2007   Dr. Gerrit Heck, [facility], Spinal Fusion   CARDIAC CATHETERIZATION N/A 04/03/2015   Procedure: Left Heart Cath and Coronary Angiography;  Surgeon: Dalia Heading, MD;  Location: [facility] INVASIVE CV LAB;  Service: Cardiovascular;  Laterality: N/A;   CATARACT EXTRACTION W/PHACO Left 04/22/2019   Procedure: CATARACT EXTRACTION PHACO AND INTRAOCULAR LENS PLACEMENT (IOC) LEFT  00:45.4  12.9%  6.03;  Surgeon: Nevada Crane, MD;  Location: [facility];  Service: Ophthalmology;  Laterality: Left;  Latex sleep apnea   CATARACT EXTRACTION W/PHACO Right 07/01/2019   Procedure: CATARACT EXTRACTION PHACO  AND INTRAOCULAR LENS PLACEMENT (IOC) RIGHT 2.38  00:24.4;  Surgeon: Nevada Crane, MD;  Location: [facility];  Service: Ophthalmology;  Laterality: Right;  Latex Sleep apnea   CHOLECYSTECTOMY     CORONARY ANGIOPLASTY  2015   Dr. Lady Gary, [facility]   ESOPHAGOGASTRODUODENOSCOPY (EGD) WITH PROPOFOL N/A 12/21/2016   Procedure: ESOPHAGOGASTRODUODENOSCOPY (EGD) WITH PROPOFOL;  Surgeon: Scot Jun, MD;  Location: [facility] ENDOSCOPY;  Service: Endoscopy;  Laterality: N/A;   ESOPHAGOGASTRODUODENOSCOPY (EGD) WITH PROPOFOL N/A 12/08/2022   Procedure: ESOPHAGOGASTRODUODENOSCOPY (EGD) WITH PROPOFOL;  Surgeon: Jaynie Collins, DO;  Location: [facility] ENDOSCOPY;  Service: Gastroenterology;   Laterality: N/A;   EYE SURGERY     FRACTURE SURGERY Left 1991   Fractures Femur, [facility]   HIP ARTHROPLASTY Right 05/07/2020   Procedure: ARTHROPLASTY RIGHT HIP (HEMIARTHROPLASTY);  Surgeon: Juanell Fairly, MD;  Location: [facility] ORS;  Service: Orthopedics;  Laterality: Right;   IMAGE GUIDED SINUS SURGERY Bilateral 09/28/2015   Procedure: IMAGE GUIDED SINUS SURGERY, SEPTOPLASTY, BILATERAL FRONTAL SINUSOTOMIES, BILATERAL MAXILLARY ANTROSTOMIES, BILATERAL TOTAL ETHMOIDECTOMY, BILATERAL SPHENOIDECTOMY, BILATERAL INFERIOR TURBINATE REDUCTION;  Surgeon: Vernie Murders, MD;  Location: [facility] ORS;  Service: ENT;  Laterality: Bilateral;   LEFT HEART CATH AND CORONARY ANGIOGRAPHY Left 01/19/2023   Procedure: LEFT HEART CATH AND CORONARY ANGIOGRAPHY;  Surgeon: Marcina Millard, MD;  Location: [facility] INVAS[facility] CV LAB;  Service: Cardiovascular;  Laterality: Left;   RECTOCELE REPAIR     Social History:  reports that she quit smoking about 24 years ago. Her smoking use included cigarettes. She has never used smokeless tobacco. She reports that she does not drink alcohol and does not use drugs.  Allergies  Allergen Reactions   Azithromycin Diarrhea and Rash    "fainting"  Other reaction(s): Syncope  "fainting"    Other reaction(s): NOT KNOWN  "fainting"   Meloxicam Diarrhea   Morphine Other (See Comments) and Rash    Other reaction(s): Unknown Retain urine Other reaction(s): NOT KNOWN, Other (See Comments) Retain urine Other reaction(s): side effect of severe rash   Adhesive [Tape]     blisters   Codeine Nausea And Vomiting   Lactose Intolerance (Gi) Other (See Comments)    "stomach pain"   Red Dye #40 (Allura Red) Itching    "red food dye"   Shellfish-Derived Products Itching and Swelling   Aspirin Rash   Latex Rash    With extended contact   Lodine [Etodolac] Rash   Morphine And Codeine Hives and Nausea And Vomiting    Other reaction(s): UNKNOWN   Penicillins Rash and Hives     Given on 05/07/20.  No reaction noted.   Pravastatin Rash    Family History  Problem Relation Age of Onset   Hypertension Mother    Cancer Mother    Diabetes Father    Hypertension Father    Heart failure Father    Breast cancer Neg Hx     Prior to Admission medications   Medication Sig Start Date End Date Taking? Authorizing Provider  acetaminophen (TYLENOL) 500 MG tablet Take 500 mg by mouth every 6 (six) hours as needed.   Yes [provider]  albuterol (PROVENTIL HFA;VENTOLIN HFA) 108 (90 BASE) MCG/ACT inhaler Inhale 2 puffs into the lungs.   Yes [provider]  amLODipine (NORVASC) 10 MG tablet Take 0.5 tablets (5 mg total) by mouth daily. 10/23/21  Yes Lurene Shadow, MD  amoxicillin (AMOXIL) 500 MG capsule Take 1 capsule (500 mg total) by mouth 2 (two) times daily  for 7 days. 04/12/23 04/19/23 Yes White, Elita Boone, NP  apixaban (ELIQUIS) 5 MG TABS tablet Take 1 tablet (5 mg total) by mouth 2 (two) times daily. Take 2 tablets (10mg ) twice daily for 7 days, then 1 tablet (5mg ) twice daily Patient taking differently: Take 5 mg by mouth 2 (two) times daily. Then 1 tablet (5mg ) twice daily 10/23/21  Yes Lurene Shadow, MD  carboxymethylcellulose (REFRESH PLUS) 0.5 % SOLN Place 1 drop into both eyes daily as needed (dry eyes).   Yes [provider]  citalopram (CELEXA) 40 MG tablet Take 40 mg by mouth daily.   Yes [provider]  clonazePAM (KLONOPIN) 1 MG tablet Take 1 mg by mouth at bedtime.   Yes [provider]  EPINEPHrine 0.3 mg/0.3 mL IJ SOAJ injection Inject 0.3 mg into the muscle once. 04/20/20  Yes [provider]  FEROSUL 325 (65 Fe) MG tablet Take 325 mg by mouth daily as needed (when tolerable). 01/26/23  Yes [provider]  Fluticasone-Salmeterol (ADVAIR HFA IN) Inhale 1 puff into the lungs daily as needed.   Yes [provider]  gabapentin (NEURONTIN) 600 MG tablet Take 600-1,200 mg by mouth See admin  instructions. Take 1 tablet (600mg ) by mouth in the morning and 2 tablets (1200mg ) by mouth at bedtime 04/24/20  Yes [provider]  levothyroxine (SYNTHROID) 150 MCG tablet Take 150 mcg by mouth daily. 09/15/21  Yes [provider]  metoprolol tartrate (LOPRESSOR) 25 MG tablet Take 1 tablet (25 mg total) by mouth daily. 10/23/21  Yes Lurene Shadow, MD  omeprazole (PRILOSEC) 20 MG capsule Take 40 mg by mouth daily.    Yes [provider]  ondansetron (ZOFRAN-ODT) 4 MG disintegrating tablet Take 1 tablet (4 mg total) by mouth every 8 (eight) hours as needed for nausea or vomiting. 04/12/23  Yes White, Adrienne R, NP  tiZANidine (ZANAFLEX) 4 MG tablet Take 4 mg by mouth at bedtime as needed for muscle spasms. Only uses 1/4 of medication 01/23/23  Yes [provider]  loratadine (CLARITIN) 10 MG tablet Take 1 tablet (10 mg total) by mouth daily. Take 1 tablet in the morning. As needed for itching. 02/14/17 02/17/19  Hassan Rowan, MD  ranitidine (ZANTAC) 150 MG capsule Take 1 capsule (150 mg total) by mouth 2 (two) times daily. 02/14/17 02/17/19  Hassan Rowan, MD    Physical Exam: Vitals:   04/17/23 2041 04/17/23 2230 04/17/23 2320 04/17/23 2335  BP: (!) 146/70 (!) 144/73 (!) 178/81 (!) 167/76  Pulse: 65 69 62 71  Resp: 20 (!) 21 19 19   Temp: 98.1 F (36.7 C)  98.3 F (36.8 C) 98.2 F (36.8 C)  TempSrc: Oral  Oral Oral  SpO2: 100% 99%  99%  Weight:      Height:       Constitutional: NAD, calm, comfortable, obese female sitting up on edge of bed and restless Eyes: lids and conjunctivae normal ENMT: Mucous membranes are moist.  Neck: normal, supple Respiratory: clear to auscultation bilaterally, no wheezing, no crackles. Normal respiratory effort. No accessory muscle use.  Cardiovascular: Regular rate and rhythm, no murmurs / rubs / gallops. No extremity edema.  Abdomen: soft, non-tenderness, obese abdomen  Rectal exam: deferred Musculoskeletal: no clubbing /  cyanosis. No joint deformity upper and lower extremities.  Normal muscle tone.  Skin: no rashes, lesions, ulcers. No induration Neurologic: CN 2-12 grossly intact.  Psychiatric: Normal judgment and insight. Alert and oriented x 3. Normal mood.  Data Reviewed:  See HPI  Assessment and Plan: * Acute GI bleeding -Has noted 3 weeks of melena and has been symptomatic with dizziness in the past 2 weeks -Hgb of 5.5 on presentation from prior of 9.9. She is on Eliquis for hx of recurrent DVTs with last dose prior to coming to ED. Holding Eliquis.  -had recent endoscopy 11/2022 with duodenitis and known hiatal hernia -Transfuse 2 units with goal Hgb >7 -Messaged [facility] to consult in the morning. Keep NPO.   Obesity, Class III, BMI 40-49.9 (morbid obesity) (HCC) -BMI of 42  Anxiety -continue Clonazepam and Celexa  History of DVT (deep vein thrombosis) -holding Eliquis with acute GI bleed.l  Hypothyroidism -TSH is low at 0.222. Will increase levothyroxine from to . Will need to follow up with PCP for repeat TSH in 6-8 weeks.  Essential hypertension -holding antihypertensives with acute anemia requiring transfusion      Advance Care Planning: Full   Consults: [facility]  Family Communication: none at bedside  Severity of Illness: The appropriate patient status for this patient is INPATIENT. Inpatient status is judged to be reasonable and necessary in order to provide the required intensity of service to ensure the patient's safety. The patient's presenting symptoms, physical exam findings, and initial radiographic and laboratory data in the context of their chronic comorbidities is felt to place them at high risk for further clinical deterioration. Furthermore, it is not anticipated that the patient will be medically stable for discharge from the hospital within 2 midnights of admission.   * I certify that at the point of admission it is my clinical judgment that the  patient will require inpatient hospital care spanning beyond 2 midnights from the point of admission due to high intensity of service, high risk for further deterioration and high frequency of surveillance required.*  Author: Anselm Jungling, DO 04/18/2023 1:44 AM  For on call review [email or web address].

## 2023-04-18 NOTE — ED Notes (Signed)
Pt assisted up to use bedside commode 

## 2023-04-18 NOTE — Assessment & Plan Note (Addendum)
-  Has noted 3 weeks of melena and has been symptomatic with dizziness in the past 2 weeks -Hgb of 5.5 on presentation from prior of 9.9. She is on Eliquis for hx of recurrent DVTs with last dose prior to coming to ED. Holding Eliquis.  -had recent endoscopy 11/2022 with duodenitis and known hiatal hernia -Transfuse 2 units with goal Hgb >7 -Messaged [facility] to consult in the morning. Keep NPO.

## 2023-04-18 NOTE — ED Notes (Signed)
ED TO INPATIENT HANDOFF REPORT  ED Nurse Name and Phone #: Donny Pique, RN [phone]  S Name/Age/Gender Stacy Allen 82 y.o. female Room/Bed: 009C/009C  Code Status   Code Status: Full Code  Home/SNF/Other Home Patient oriented to: self, place, time, and situation Is this baseline? Yes   Triage Complete: Triage complete  Chief Complaint Acute GI bleeding [K92.2]  Triage Note Fall on thinners from home. On Eliquis. Rolled out of bed to the ground at 4AM. Pt hit head. Intermittent dizziness and nausea. Hematoma to right eye. Alert and oriented x 4.    Allergies Allergies  Allergen Reactions   Azithromycin Diarrhea and Rash    "fainting"  Other reaction(s): Syncope  "fainting"    Other reaction(s): NOT KNOWN  "fainting"   Meloxicam Diarrhea   Morphine Other (See Comments) and Rash    Other reaction(s): Unknown Retain urine Other reaction(s): NOT KNOWN, Other (See Comments) Retain urine Other reaction(s): side effect of severe rash   Adhesive [Tape]     blisters   Codeine Nausea And Vomiting   Lactose Intolerance (Gi) Other (See Comments)    "stomach pain"   Red Dye #40 (Allura Red) Itching    "red food dye"   Shellfish-Derived Products Itching and Swelling   Aspirin Rash   Latex Rash    With extended contact   Lodine [Etodolac] Rash   Morphine And Codeine Hives and Nausea And Vomiting    Other reaction(s): UNKNOWN   Penicillins Rash and Hives    Given on 05/07/20.  No reaction noted.   Pravastatin Rash    Level of Care/Admitting Diagnosis ED Disposition     ED Disposition  Admit   Condition  --   Comment  Hospital [facility]: [facility] [[id number]]  Level of Care: Progressive [102]  Admit to Progressive based on following criteria: GI, ENDOCRINE disease patients with GI bleeding, acute liver failure or pancreatitis, stable with diabetic ketoacidosis or thyrotoxicosis (hypothyroid) state.  May admit patient to [facility] or [facility] if  equivalent level of care is available:: No  Covid Evaluation: Asymptomatic - no recent exposure (last 10 days) testing not required  Diagnosis: Acute GI bleeding [253168]  Admitting Physician: Anselm Jungling [[id number]]  Attending Physician: Anselm Jungling [[id number]]  Certification:: I certify this patient will need inpatient services for at least 2 midnights  Expected Medical Readiness: 04/20/2023          B Medical/Surgery History Past Medical History:  Diagnosis Date   Adenomatous polyps    Allergic state    Anemia    Anginal pain (HCC)    Anxiety    Arthritis    osteoarthritis   Broken rib    left   Cancer (HCC)    skin   CHF (congestive heart failure) (HCC)    Chicken pox    Complication of anesthesia    respiratory distress after rectocele surgery   Coronary artery disease    Depression    Eczema    Fibromyalgia    GERD (gastroesophageal reflux disease)    Headache    migraines   Hemorrhoids    History of hiatal hernia    Hypercalcemia    Hyperlipidemia    Hypertension    Hypothyroidism    Lumbar stenosis    Obesity    Peptic ulcer disease    Shortness of breath dyspnea    Sleep apnea    No CPAP   Vertigo    Vitamin D  deficiency    Past Surgical History:  Procedure Laterality Date   ABDOMINAL HYSTERECTOMY     BACK SURGERY  2007   Dr. Gerrit Heck, [facility], Spinal Fusion   CARDIAC CATHETERIZATION N/A 04/03/2015   Procedure: Left Heart Cath and Coronary Angiography;  Surgeon: Dalia Heading, MD;  Location: [facility] INVASIVE CV LAB;  Service: Cardiovascular;  Laterality: N/A;   CATARACT EXTRACTION W/PHACO Left 04/22/2019   Procedure: CATARACT EXTRACTION PHACO AND INTRAOCULAR LENS PLACEMENT (IOC) LEFT  00:45.4  12.9%  6.03;  Surgeon: Nevada Crane, MD;  Location: [facility];  Service: Ophthalmology;  Laterality: Left;  Latex sleep apnea   CATARACT EXTRACTION W/PHACO Right 07/01/2019   Procedure: CATARACT EXTRACTION PHACO AND INTRAOCULAR LENS PLACEMENT (IOC)  RIGHT 2.38  00:24.4;  Surgeon: Nevada Crane, MD;  Location: [facility] SUR[facility]ERY [facility];  Service: Ophthalmology;  Laterality: Right;  Latex Sleep apnea   CHOLECYSTECTOMY     CORONARY ANGIOPLASTY  2015   Dr. Lady Gary, [facility]   ESOPHAGOGASTRODUODENOSCOPY (EGD) WITH PROPOFOL N/A 12/21/2016   Procedure: ESOPHAGOGASTRODUODENOSCOPY (EGD) WITH PROPOFOL;  Surgeon: Scot Jun, MD;  Location: [facility] ENDOSCOPY;  Service: Endoscopy;  Laterality: N/A;   ESOPHAGOGASTRODUODENOSCOPY (EGD) WITH PROPOFOL N/A 12/08/2022   Procedure: ESOPHAGOGASTRODUODENOSCOPY (EGD) WITH PROPOFOL;  Surgeon: Jaynie Collins, DO;  Location: [facility] ENDOSCOPY;  Service: Gastroenterology;  Laterality: N/A;   EYE SURGERY     FRACTURE SURGERY Left 1991   Fractures Femur, [facility]   HIP ARTHROPLASTY Right 05/07/2020   Procedure: ARTHROPLASTY RIGHT HIP (HEMIARTHROPLASTY);  Surgeon: Juanell Fairly, MD;  Location: [facility] ORS;  Service: Orthopedics;  Laterality: Right;   IMAGE GUIDED SINUS SURGERY Bilateral 09/28/2015   Procedure: IMAGE GUIDED SINUS SURGERY, SEPTOPLASTY, BILATERAL FRONTAL SINUSOTOMIES, BILATERAL MAXILLARY ANTROSTOMIES, BILATERAL TOTAL ETHMOIDECTOMY, BILATERAL SPHENOIDECTOMY, BILATERAL INFERIOR TURBINATE REDUCTION;  Surgeon: Vernie Murders, MD;  Location: [facility] ORS;  Service: ENT;  Laterality: Bilateral;   LEFT HEART CATH AND CORONARY ANGIOGRAPHY Left 01/19/2023   Procedure: LEFT HEART CATH AND CORONARY ANGIOGRAPHY;  Surgeon: Marcina Millard, MD;  Location: [facility] INVASIVE CV LAB;  Service: Cardiovascular;  Laterality: Left;   RECTOCELE REPAIR       A IV Location/Drains/Wounds Patient Lines/Drains/Airways Status     Active Line/Drains/Airways     Name Placement date Placement time Site Days   Peripheral IV 04/17/23 20 G Left Antecubital 04/17/23  2319  Antecubital  1            Intake/Output Last 24 hours No intake or output data in the 24 hours ending 04/18/23 1449  Labs/Imaging Results for  orders placed or performed during the hospital encounter of 04/17/23 (from the past 48 hour(s))  TSH     Status: Abnormal   Collection Time: 04/17/23  5:33 PM  Result Value Ref Range   TSH 0.222 (L) 0.350 - 4.500 uIU/mL    Comment: Performed by a 3rd Generation assay with a functional sensitivity of <=0.01 uIU/mL. Performed at [facility] Lab, [address], [place], [place] [address]   Brain natriuretic peptide     Status: Abnormal   Collection Time: 04/17/23  5:33 PM  Result Value Ref Range   B Natriuretic Peptide 339.5 (H) 0.0 - 100.0 pg/mL    Comment: Performed at [facility] Lab, [address], [place], [place] [address]  Troponin I (High Sensitivity)     Status: None   Collection Time: 04/17/23  5:33 PM  Result Value Ref Range   Troponin I (High Sensitivity) 8 <  18 ng/L    Comment: (NOTE) Elevated high sensitivity troponin I (hsTnI) values and significant  changes across serial measurements may suggest ACS but many other  chronic and acute conditions are known to elevate hsTnI results.  Refer to the "Links" section for chest pain algorithms and additional  guidance. Performed at [facility], [address], [place], [place] [address]   Comprehensive metabolic panel     Status: Abnormal   Collection Time: 04/17/23  5:33 PM  Result Value Ref Range   Sodium 136 135 - 145 mmol/L   Potassium 3.8 3.5 - 5.1 mmol/L   Chloride 101 98 - 111 mmol/L   CO2 26 22 - 32 mmol/L   Glucose, Bld 97 70 - 99 mg/dL    Comment: Glucose reference range applies only to samples taken after fasting for at least 8 hours.   BUN 18 8 - 23 mg/dL   Creatinine, Ser 6.96 0.44 - 1.00 mg/dL   Calcium 8.3 (L) 8.9 - 10.3 mg/dL   Total Protein 6.0 (L) 6.5 - 8.1 g/dL   Albumin 3.1 (L) 3.5 - 5.0 g/dL   AST 12 (L) 15 - 41 U/L   ALT 13 0 - 44 U/L   Alkaline Phosphatase 55 38 - 126 U/L   Total Bilirubin 0.7 0.3 - 1.2 mg/dL   GFR, Estimated >29 >52 mL/min    Comment: (NOTE) Calculated using the  CKD-EPI Creatinine Equation (2021)    Anion gap 9 5 - 15    Comment: Performed at [facility], [address], [place], [place] [address]  Lipase, blood     Status: None   Collection Time: 04/17/23  5:33 PM  Result Value Ref Range   Lipase 23 11 - 51 U/L    Comment: Performed at [facility], [address], [place], [place] [address]  Lactic acid, plasma     Status: None   Collection Time: 04/17/23  5:33 PM  Result Value Ref Range   Lactic Acid, Venous 0.9 0.5 - 1.9 mmol/L    Comment: Performed at [facility], [address], [place], [place] [address]  CBC with Differential/Platelet     Status: Abnormal   Collection Time: 04/17/23  5:33 PM  Result Value Ref Range   WBC 8.8 4.0 - 10.5 K/uL   RBC 2.35 (L) 3.87 - 5.11 MIL/uL   Hemoglobin 5.5 (LL) 12.0 - 15.0 g/dL    Comment: REPEATED TO VERIFY ATTEMPTED TO CALL AT 1832 THIS CRITICAL RESULT HAS VERIFIED AND BEEN CALLED TO Vidal Schwalbe, RN BY SHAY EKDAHL ON 10 21 2024 AT 1838, AND HAS BEEN READ BACK.     HCT 19.6 (L) 36.0 - 46.0 %   MCV 83.4 80.0 - 100.0 fL   MCH 23.4 (L) 26.0 - 34.0 pg   MCHC 28.1 (L) 30.0 - 36.0 g/dL   RDW 66.4 (H) 40.3 - 47.4 %   Platelets 332 150 - 400 K/uL    Comment: REPEATED TO VERIFY   nRBC 0.3 (H) 0.0 - 0.2 %   Neutrophils Relative % 68 %   Neutro Abs 6.0 1.7 - 7.7 K/uL   Lymphocytes Relative 19 %   Lymphs Abs 1.7 0.7 - 4.0 K/uL   Monocytes Relative 10 %   Monocytes Absolute 0.9 0.1 - 1.0 K/uL   Eosinophils Relative 1 %   Eosinophils Absolute 0.1 0.0 - 0.5 K/uL   Basophils Relative 1 %   Basophils Absolute  0.1 0.0 - 0.1 K/uL   Immature Granulocytes 1 %   Abs Immature Granulocytes 0.05 0.00 - 0.07 K/uL    Comment: Performed at [facility] Lab, [address] [place], [place] [address]  Urinalysis, w/ Reflex to Culture (Infection Suspected) -Urine, Clean Catch     Status: Abnormal   Collection Time: 04/17/23  6:08 PM  Result Value Ref Range   Specimen Source URINE, CLEAN CATCH     Color, Urine STRAW (A) YELLOW   APPearance CLEAR CLEAR   Specific Gravity, Urine 1.010 1.005 - 1.030   pH 7.0 5.0 - 8.0   Glucose, UA NEGATIVE NEGATIVE mg/dL   Hgb urine dipstick NEGATIVE NEGATIVE   Bilirubin Urine NEGATIVE NEGATIVE   Ketones, ur NEGATIVE NEGATIVE mg/dL   Protein, ur NEGATIVE NEGATIVE mg/dL   Nitrite NEGATIVE NEGATIVE   Leukocytes,Ua NEGATIVE NEGATIVE   RBC / HPF 0-5 0 - 5 RBC/hpf   WBC, UA 0-5 0 - 5 WBC/hpf    Comment:        Reflex urine culture not performed if WBC <=10, OR if Squamous epithelial cells >5. If Squamous epithelial cells >5 suggest recollection.    Bacteria, UA NONE SEEN NONE SEEN   Squamous Epithelial / HPF 0-5 0 - 5 /HPF    Comment: Performed at [facility], [address], [place], [place] [address]  Prepare RBC (crossmatch)     Status: None   Collection Time: 04/17/23  6:41 PM  Result Value Ref Range   Order Confirmation      ORDER PROCESSED BY BLOOD BANK Performed at [facility], [address], [place], [place] [address]   Type and screen [facility]     Status: None   Collection Time: 04/17/23  6:50 PM  Result Value Ref Range   ABO/RH(D) A POS    Antibody Screen NEG    Sample Expiration 04/20/2023,2359    Unit Number [id number]    Blood Component Type RED CELLS,LR    Unit division 00    Status of Unit ISSUED,FINAL    Transfusion Status OK TO TRANSFUSE    Crossmatch Result Compatible    Unit Number [id number]    Blood Component Type RED CELLS,LR    Unit division 00    Status of Unit ISSUED,FINAL    Transfusion Status OK TO TRANSFUSE    Crossmatch Result      Compatible Performed at [facility] Lab, [address], [place], [place] [address]   Troponin I (High Sensitivity)     Status: None   Collection Time: 04/17/23  6:54 PM  Result Value Ref Range   Troponin I (High Sensitivity) 9 <18 ng/L    Comment: (NOTE) Elevated high sensitivity troponin I (hsTnI) values and significant   changes across serial measurements may suggest ACS but many other  chronic and acute conditions are known to elevate hsTnI results.  Refer to the "Links" section for chest pain algorithms and additional  guidance. Performed at [facility], [address] [place], [place] [address]   POC occult blood, ED     Status: Abnormal   Collection Time: 04/17/23  7:50 PM  Result Value Ref Range   Fecal Occult Bld POSITIVE (A) NEGATIVE  CBC     Status: Abnormal   Collection Time: 04/18/23  3:22 AM  Result Value Ref Range   WBC 7.0 4.0 - 10.5 K/uL   RBC 3.13 (L) 3.87 - 5.11  MIL/uL   Hemoglobin 7.9 (L) 12.0 - 15.0 g/dL    Comment: REPEATED TO VERIFY POST TRANSFUSION SPECIMEN    HCT 26.1 (L) 36.0 - 46.0 %   MCV 83.4 80.0 - 100.0 fL   MCH 25.2 (L) 26.0 - 34.0 pg   MCHC 30.3 30.0 - 36.0 g/dL   RDW 16.1 (H) 09.6 - 04.5 %   Platelets 271 150 - 400 K/uL   nRBC 0.4 (H) 0.0 - 0.2 %    Comment: Performed at [facility], [address], [place], [place] [address]  Basic metabolic panel     Status: Abnormal   Collection Time: 04/18/23  3:22 AM  Result Value Ref Range   Sodium 135 135 - 145 mmol/L   Potassium 3.7 3.5 - 5.1 mmol/L   Chloride 100 98 - 111 mmol/L   CO2 27 22 - 32 mmol/L   Glucose, Bld 103 (H) 70 - 99 mg/dL    Comment: Glucose reference range applies only to samples taken after fasting for at least 8 hours.   BUN 12 8 - 23 mg/dL   Creatinine, Ser 1.91 0.44 - 1.00 mg/dL   Calcium 8.3 (L) 8.9 - 10.3 mg/dL   GFR, Estimated >47 >82 mL/min    Comment: (NOTE) Calculated using the CKD-EPI Creatinine Equation (2021)    Anion gap 8 5 - 15    Comment: Performed at [facility] Lab, [address] [place], [place] [address]   CT CHEST ABDOMEN PELVIS W CONTRAST  Result Date: 04/17/2023 CLINICAL DATA:  Polytrauma, blunt Fall elevated on Eliquis this morning, near syncope for last 3 weeks. Left lower chest pain/left upper abdominal pain. Right facial injury. EXAM: CT CHEST,  ABDOMEN, AND PELVIS WITH CONTRAST TECHNIQUE: Multidetector CT imaging of the chest, abdomen and pelvis was performed following the standard protocol during bolus administration of intravenous contrast. RADIATION DOSE REDUCTION: This exam was performed according to the departmental dose-optimization program which includes automated exposure control, adjustment of the mA and/or kV according to patient size and/or use of iterative reconstruction technique. CONTRAST:  75mL OMNIPAQUE IOHEXOL 350 MG/ML SOLN COMPARISON:  None Available. FINDINGS: CHEST: Cardiovascular: No aortic injury. The thoracic aorta is normal in caliber. The heart is enlarged in size. No significant pericardial effusion. At least 3 vessel coronary calcification. Mild atherosclerotic plaque. Mediastinum/Nodes: No pneumomediastinum. No mediastinal hematoma. The esophagus is unremarkable. Large volume hiatal hernia containing the majority of the stomach. The thyroid is unremarkable. The central airways are patent. No mediastinal, hilar, or axillary lymphadenopathy. Lungs/Pleura: No focal consolidation. No pulmonary nodule. No pulmonary mass. No pulmonary contusion or laceration. No pneumatocele formation. Bilateral trace, right greater than left, pleural effusions. No pneumothorax. No hemothorax. Musculoskeletal/Chest wall: No chest wall mass. No acute rib or sternal fracture. No spinal fracture. Old healed left rib fractures. ABDOMEN / PELVIS: Hepatobiliary: Not enlarged. No focal lesion. No laceration or subcapsular hematoma. Status post cholecystectomy.  No biliary ductal dilatation. Pancreas: Atrophic parenchyma. Otherwise normal pancreatic contour. No main pancreatic duct dilatation. Spleen: Not enlarged. No focal lesion. No laceration, subcapsular hematoma, or vascular injury. Adrenals/Urinary Tract: No nodularity bilaterally. Bilateral kidneys enhance symmetrically. No hydronephrosis. No contusion, laceration, or subcapsular hematoma. No injury  to the vascular structures or collecting systems. No hydroureter. The urinary bladder is unremarkable. Stomach/Bowel: No small or large bowel wall thickening or dilatation. Status post appendectomy. Vasculature/Lymphatics: Moderate atherosclerotic plaque. No abdominal aorta or iliac aneurysm. No active contrast extravasation or pseudoaneurysm. No abdominal, pelvic, inguinal  lymphadenopathy. Reproductive: Status post hysterectomy with limited evaluation due to streak artifact originating from the right femoral surgical hardware. Other: No simple free fluid ascites. No pneumoperitoneum. No hemoperitoneum. No mesenteric hematoma identified. No organized fluid collection. Musculoskeletal: No significant soft tissue hematoma. No acute pelvic fracture. No spinal fracture. L2-L4 poor posterolateral and interbody fusion surgical hardware. Status post total right hip arthroplasty. 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 Ports and Devices: None. IMPRESSION: 1. No acute intrathoracic, intra-abdominal, intrapelvic traumatic injury. 2. No acute fracture or traumatic malalignment of the thoracic or lumbar spine. Other imaging findings of potential clinical significance: 1. Cardiomegaly. 2. Bilateral trace, right greater than left, pleural effusions. 3. Large volume hiatal hernia containing the majority of the stomach. 4.  Aortic Atherosclerosis (ICD10-I70.0). Electronically Signed   By: Tish Frederickson M.D.   On: 04/17/2023 20:12   CT HEAD WO CONTRAST ( )  Result Date: 04/17/2023 CLINICAL DATA:  Polytrauma, blunt; Facial trauma, blunt Fall elevated on Eliquis this morning, near syncope for last 3 weeks. Left lower chest pain/left upper abdominal pain. Right facial injury. EXAM: CT HEAD WITHOUT CONTRAST CT MAXILLOFACIAL WITHOUT CONTRAST CT CERVICAL SPINE WITHOUT CONTRAST TECHNIQUE: Multidetector CT imaging of the head, cervical spine, and maxillofacial structures were performed using the standard protocol  without intravenous contrast. Multiplanar CT image reconstructions of the cervical spine and maxillofacial structures were also generated. RADIATION DOSE REDUCTION: This exam was performed according to the departmental dose-optimization program which includes automated exposure control, adjustment of the mA and/or kV according to patient size and/or use of iterative reconstruction technique. COMPARISON:  None Available. FINDINGS: CT HEAD FINDINGS Brain: Cerebral ventricle sizes are concordant with the degree of cerebral volume loss. No evidence of large-territorial acute infarction. No parenchymal hemorrhage. No mass lesion. No extra-axial collection. No mass effect or midline shift. No hydrocephalus. Basilar cisterns are patent. Vascular: No hyperdense vessel. Skull: No acute fracture or focal lesion. Other: A 1.3 cm right periorbital/frontal scalp hematoma formation. CT MAXILLOFACIAL FINDINGS Osseous: No fracture or mandibular dislocation. No destructive process. Sinuses/Orbits: Surgical changes of the sinuses. Paranasal sinuses and mastoid air cells are clear. Bilateral lens replacement. Otherwise the orbits are unremarkable. Soft tissues: Negative. CT CERVICAL SPINE FINDINGS Alignment: Reversal of the normal cervical lordosis centered at the C5-C6 level likely due to positioning. Skull base and vertebrae: Multilevel degenerative changes of the spine most prominent at the C5 through C7 levels. No acute fracture. No aggressive appearing focal osseous lesion or focal pathologic process. Soft tissues and spinal canal: No prevertebral fluid or swelling. No visible canal hematoma. Upper chest: Unremarkable. Other: None. IMPRESSION: 1. No acute intracranial abnormality. 2.  No acute displaced facial fracture. 3. No acute displaced fracture or traumatic listhesis of the cervical spine. Electronically Signed   By: Tish Frederickson M.D.   On: 04/17/2023 19:54   CT Cervical Spine Wo Contrast  Result Date:  04/17/2023 CLINICAL DATA:  Polytrauma, blunt; Facial trauma, blunt Fall elevated on Eliquis this morning, near syncope for last 3 weeks. Left lower chest pain/left upper abdominal pain. Right facial injury. EXAM: CT HEAD WITHOUT CONTRAST CT MAXILLOFACIAL WITHOUT CONTRAST CT CERVICAL SPINE WITHOUT CONTRAST TECHNIQUE: Multidetector CT imaging of the head, cervical spine, and maxillofacial structures were performed using the standard protocol without intravenous contrast. Multiplanar CT image reconstructions of the cervical spine and maxillofacial structures were also generated. RADIATION DOSE REDUCTION: This exam was performed according to the departmental dose-optimization program which includes automated exposure control, adjustment of the mA and/or  kV according to patient size and/or use of iterative reconstruction technique. COMPARISON:  None Available. FINDINGS: CT HEAD FINDINGS Brain: Cerebral ventricle sizes are concordant with the degree of cerebral volume loss. No evidence of large-territorial acute infarction. No parenchymal hemorrhage. No mass lesion. No extra-axial collection. No mass effect or midline shift. No hydrocephalus. Basilar cisterns are patent. Vascular: No hyperdense vessel. Skull: No acute fracture or focal lesion. Other: A 1.3 cm right periorbital/frontal scalp hematoma formation. CT MAXILLOFACIAL FINDINGS Osseous: No fracture or mandibular dislocation. No destructive process. Sinuses/Orbits: Surgical changes of the sinuses. Paranasal sinuses and mastoid air cells are clear. Bilateral lens replacement. Otherwise the orbits are unremarkable. Soft tissues: Negative. CT CERVICAL SPINE FINDINGS Alignment: Reversal of the normal cervical lordosis centered at the C5-C6 level likely due to positioning. Skull base and vertebrae: Multilevel degenerative changes of the spine most prominent at the C5 through C7 levels. No acute fracture. No aggressive appearing focal osseous lesion or focal pathologic  process. Soft tissues and spinal canal: No prevertebral fluid or swelling. No visible canal hematoma. Upper chest: Unremarkable. Other: None. IMPRESSION: 1. No acute intracranial abnormality. 2.  No acute displaced facial fracture. 3. No acute displaced fracture or traumatic listhesis of the cervical spine. Electronically Signed   By: Tish Frederickson M.D.   On: 04/17/2023 19:54   CT Maxillofacial Wo Contrast  Result Date: 04/17/2023 CLINICAL DATA:  Polytrauma, blunt; Facial trauma, blunt Fall elevated on Eliquis this morning, near syncope for last 3 weeks. Left lower chest pain/left upper abdominal pain. Right facial injury. EXAM: CT HEAD WITHOUT CONTRAST CT MAXILLOFACIAL WITHOUT CONTRAST CT CERVICAL SPINE WITHOUT CONTRAST TECHNIQUE: Multidetector CT imaging of the head, cervical spine, and maxillofacial structures were performed using the standard protocol without intravenous contrast. Multiplanar CT image reconstructions of the cervical spine and maxillofacial structures were also generated. RADIATION DOSE REDUCTION: This exam was performed according to the departmental dose-optimization program which includes automated exposure control, adjustment of the mA and/or kV according to patient size and/or use of iterative reconstruction technique. COMPARISON:  None Available. FINDINGS: CT HEAD FINDINGS Brain: Cerebral ventricle sizes are concordant with the degree of cerebral volume loss. No evidence of large-territorial acute infarction. No parenchymal hemorrhage. No mass lesion. No extra-axial collection. No mass effect or midline shift. No hydrocephalus. Basilar cisterns are patent. Vascular: No hyperdense vessel. Skull: No acute fracture or focal lesion. Other: A 1.3 cm right periorbital/frontal scalp hematoma formation. CT MAXILLOFACIAL FINDINGS Osseous: No fracture or mandibular dislocation. No destructive process. Sinuses/Orbits: Surgical changes of the sinuses. Paranasal sinuses and mastoid air cells are  clear. Bilateral lens replacement. Otherwise the orbits are unremarkable. Soft tissues: Negative. CT CERVICAL SPINE FINDINGS Alignment: Reversal of the normal cervical lordosis centered at the C5-C6 level likely due to positioning. Skull base and vertebrae: Multilevel degenerative changes of the spine most prominent at the C5 through C7 levels. No acute fracture. No aggressive appearing focal osseous lesion or focal pathologic process. Soft tissues and spinal canal: No prevertebral fluid or swelling. No visible canal hematoma. Upper chest: Unremarkable. Other: None. IMPRESSION: 1. No acute intracranial abnormality. 2.  No acute displaced facial fracture. 3. No acute displaced fracture or traumatic listhesis of the cervical spine. Electronically Signed   By: Tish Frederickson M.D.   On: 04/17/2023 19:54   DG Humerus Right  Result Date: 04/17/2023 CLINICAL DATA:  Near syncope, fall.  Rolled out of bed.  Pain. EXAM: RIGHT HUMERUS - 2+ VIEW COMPARISON:  None Available. FINDINGS: There is no  evidence of fracture or other focal bone lesions. Cortical margins of the humerus are intact. Soft tissues are unremarkable. IMPRESSION: Negative radiographs of the right humerus. Electronically Signed   By: Narda Rutherford M.D.   On: 04/17/2023 17:29   DG Chest Portable 1 View  Result Date: 04/17/2023 CLINICAL DATA:  Syncope, fall. EXAM: PORTABLE CHEST 1 VIEW COMPARISON:  04/12/2023 FINDINGS: Stable cardiomegaly. Unchanged mediastinal contours. Retrocardiac hiatal hernia, moderate to large in size. Slight vascular congestion without edema. No pleural effusion or pneumothorax. No acute osseous findings. IMPRESSION: 1. Cardiomegaly with slight vascular congestion. 2. Moderate to large hiatal hernia. Electronically Signed   By: Narda Rutherford M.D.   On: 04/17/2023 17:28    Pending Labs Unresulted Labs (From admission, onward)     Start     Ordered   04/19/23 0500  CBC  Tomorrow morning,   R        04/18/23 1414    04/19/23 0500  Comprehensive metabolic panel  Tomorrow morning,   R        04/18/23 1414   04/19/23 0500  Protime-INR  Tomorrow morning,   R        04/18/23 1414   04/18/23 1310  Protime-INR  Add-on,   AD        04/18/23 1309   04/17/23 1655  CBC with Differential  Once,   STAT        04/17/23 1655   04/17/23 1654  Urine Culture  Once,   URGENT       Question:  Indication  Answer:  Urgency/frequency   04/17/23 1655            Vitals/Pain Today's Vitals   04/18/23 1130 04/18/23 1212 04/18/23 1300 04/18/23 1428  BP:  (!) 188/77 (!) 174/77   Pulse: 77 73 76   Resp: (!) 23 (!) 23 18   Temp: 98.2 F (36.8 C)     TempSrc:      SpO2: 99% 99% 98%   Weight:      Height:      PainSc:    5     Isolation Precautions No active isolations  Medications Medications  acetaminophen (TYLENOL) tablet 500 mg (500 mg Oral Given 04/18/23 1436)  citalopram (CELEXA) tablet 40 mg (40 mg Oral Given 04/18/23 0952)  levothyroxine (SYNTHROID) tablet 150 mcg (150 mcg Oral Given 04/18/23 0616)  clonazePAM (KLONOPIN) tablet 1 mg (1 mg Oral Given 04/18/23 0150)  gabapentin (NEURONTIN) capsule 600 mg (600 mg Oral Given 04/18/23 0951)    And  gabapentin (NEURONTIN) capsule 1,200 mg (has no administration in time range)  pantoprazole (PROTONIX) injection 40 mg (has no administration in time range)  amLODipine (NORVASC) tablet 5 mg (has no administration in time range)  metoprolol tartrate (LOPRESSOR) tablet 25 mg (has no administration in time range)  0.9 %  sodium chloride infusion (Manually program via Guardrails IV Fluids) (0 mLs Intravenous Stopped 04/18/23 0310)  iohexol (OMNIPAQUE) 350 MG/ML injection 75 mL (75 mLs Intravenous Contrast Given 04/17/23 1900)  acetaminophen (TYLENOL) tablet 650 mg (650 mg Oral Given 04/17/23 2037)    Mobility walks with person assist     Focused Assessments    R Recommendations: See Admitting Provider Note  Report given to:   Additional Notes: Patient  is ambulatory once she has help getting up. She swallows pills whole without any trouble, she just got tylenol for her stomach pain/diarrhea/head ache. Vital signs are stable and she is A&Ox4

## 2023-04-18 NOTE — Assessment & Plan Note (Addendum)
-  TSH is low at 0.222. Will increase levothyroxine from to . Will need to follow up with PCP for repeat TSH in 6-8 weeks.

## 2023-04-18 NOTE — ED Notes (Addendum)
MD Tu at bedside

## 2023-04-18 NOTE — Assessment & Plan Note (Signed)
-  continue Clonazepam and Celexa

## 2023-04-18 NOTE — Progress Notes (Signed)
PROGRESS NOTE    Stacy Allen  ZOX:[id number] DOB: [birth year] DOA: 04/17/2023 PCP: Jodi Marble, NP  Chief Complaint  Patient presents with   Fall    Brief Narrative:   Stacy Allen is Stacy Allen 82 y.o. female with medical history significant of CAD with RCA stent, recurrent DVT on Eliquis, HTN COPD, hypothyroidism, C.diff , adrenal insufficiency, morbid obesity who presents with fall.   She was found to be anemic and reported 3 weeks of melena.  GI consulted and planning EGD 10/23.  Eliquis is on hold.   Assessment & Plan:   Principal Problem:   Acute GI bleeding Active Problems:   Essential hypertension   Hypothyroidism   History of DVT (deep vein thrombosis)   Anxiety   Obesity, Class III, BMI 40-49.9 (morbid obesity) (HCC)   Symptomatic anemia   Anticoagulated  Acute GI bleeding Symptomatic Anemia  -Has noted 3 weeks of melena and has been symptomatic with dizziness in the past 2 weeks -Hgb of 5.5 on presentation from prior of 9.9. She is on Eliquis for hx of recurrent DVTs with last dose prior to coming to ED. Holding Eliquis.  -had recent endoscopy 11/2022 with duodenitis and known hiatal hernia -s/p 2 units pRBC -trend H/H -appreciate GI assistance - PPI BID, hold eliquis, EGD 10/23  Mechanical Fall - sounds like she rolled out of bed.  ? If orthostasis related with her anemia.  Has periorbital bruising on R and hematoma over R forehead.  Bruising to R shoulder. - CT CAP without acute fracture - CT head, C spine, and maxillofacial without acute infjury. Negative radiographs of the right humerus.  Bilateral Effusions - follow BNP and echo - consider dose of diuretic   Obesity, Class III, BMI 40-49.9 (morbid obesity) (HCC) -BMI of 42   Anxiety -continue Clonazepam and Celexa   History of DVT (deep vein thrombosis) -holding Eliquis with acute GI bleed.   Hypothyroidism -TSH is low at 0.222. Will increase levothyroxine from to . Will need to  follow up with PCP for repeat TSH in 6-8 weeks.   Essential hypertension BP elevated, resume amlodipine and metoprolol      DVT prophylaxis: SCD Code Status: full Family Communication: none Disposition:   Status is: Inpatient Remains inpatient appropriate because: need for inpatient care   Consultants:  GI  Procedures:  none  Antimicrobials:  Anti-infectives (From admission, onward)    None       Subjective: Worn out, notes she's getting depressed down in the ED  Objective: Vitals:   04/18/23 1212 04/18/23 1300 04/18/23 1601 04/18/23 1723  BP: (!) 188/77 (!) 174/77 (!) 174/81 (!) 158/82  Pulse: 73 76 70   Resp: (!) 23 18 20 16   Temp:   98.1 F (36.7 C) 97.9 F (36.6 C)  TempSrc:   Oral Oral  SpO2: 99% 98% 98%   Weight:      Height:       No intake or output data in the 24 hours ending 04/18/23 1727 Filed Weights   04/17/23 1648  Weight: 108 kg    Examination:  General exam: Appears calm and comfortable  Respiratory system: unlabored Cardiovascular system: RRR Gastrointestinal system: Abdomen is nondistended, soft and nontender.  Central nervous system: Alert and oriented. No focal neurological deficits. Extremities: bilateral mild edema   Data Reviewed: I have personally reviewed following labs and imaging studies  CBC: Recent Labs  Lab 04/17/23 1733 04/18/23 0322 04/18/23 1628  WBC 8.8 7.0  --  NEUTROABS 6.0  --   --   HGB 5.5* 7.9* 7.8*  HCT 19.6* 26.1* 26.3*  MCV 83.4 83.4  --   PLT 332 271  --     Basic Metabolic Panel: Recent Labs  Lab 04/17/23 1733 04/18/23 0322  NA 136 135  K 3.8 3.7  CL 101 100  CO2 26 27  GLUCOSE 97 103*  BUN 18 12  CREATININE 0.81 0.72  CALCIUM 8.3* 8.3*  MG  --  2.1    GFR: Estimated Creatinine Clearance: 66.1 mL/min (by C-G formula based on SCr of 0.72 mg/dL).  Liver Function Tests: Recent Labs  Lab 04/17/23 1733  AST 12*  ALT 13  ALKPHOS 55  BILITOT 0.7  PROT 6.0*  ALBUMIN 3.1*     CBG: No results for input(s): "GLUCAP" in the last 168 hours.   No results found for this or any previous visit (from the past 240 hour(s)).       Radiology Studies: CT CHEST ABDOMEN PELVIS W CONTRAST  Result Date: 04/17/2023 CLINICAL DATA:  Polytrauma, blunt Fall elevated on Eliquis this morning, near syncope for last 3 weeks. Left lower chest pain/left upper abdominal pain. Right facial injury. EXAM: CT CHEST, ABDOMEN, AND PELVIS WITH CONTRAST TECHNIQUE: Multidetector CT imaging of the chest, abdomen and pelvis was performed following the standard protocol during bolus administration of intravenous contrast. RADIATION DOSE REDUCTION: This exam was performed according to the departmental dose-optimization program which includes automated exposure control, adjustment of the mA and/or kV according to patient size and/or use of iterative reconstruction technique. CONTRAST:  75mL OMNIPAQUE IOHEXOL 350 MG/ML SOLN COMPARISON:  None Available. FINDINGS: CHEST: Cardiovascular: No aortic injury. The thoracic aorta is normal in caliber. The heart is enlarged in size. No significant pericardial effusion. At least 3 vessel coronary calcification. Mild atherosclerotic plaque. Mediastinum/Nodes: No pneumomediastinum. No mediastinal hematoma. The esophagus is unremarkable. Large volume hiatal hernia containing the majority of the stomach. The thyroid is unremarkable. The central airways are patent. No mediastinal, hilar, or axillary lymphadenopathy. Lungs/Pleura: No focal consolidation. No pulmonary nodule. No pulmonary mass. No pulmonary contusion or laceration. No pneumatocele formation. Bilateral trace, right greater than left, pleural effusions. No pneumothorax. No hemothorax. Musculoskeletal/Chest wall: No chest wall mass. No acute rib or sternal fracture. No spinal fracture. Old healed left rib fractures. ABDOMEN / PELVIS: Hepatobiliary: Not enlarged. No focal lesion. No laceration or subcapsular  hematoma. Status post cholecystectomy.  No biliary ductal dilatation. Pancreas: Atrophic parenchyma. Otherwise normal pancreatic contour. No main pancreatic duct dilatation. Spleen: Not enlarged. No focal lesion. No laceration, subcapsular hematoma, or vascular injury. Adrenals/Urinary Tract: No nodularity bilaterally. Bilateral kidneys enhance symmetrically. No hydronephrosis. No contusion, laceration, or subcapsular hematoma. No injury to the vascular structures or collecting systems. No hydroureter. The urinary bladder is unremarkable. Stomach/Bowel: No small or large bowel wall thickening or dilatation. Status post appendectomy. Vasculature/Lymphatics: Moderate atherosclerotic plaque. No abdominal aorta or iliac aneurysm. No active contrast extravasation or pseudoaneurysm. No abdominal, pelvic, inguinal lymphadenopathy. Reproductive: Status post hysterectomy with limited evaluation due to streak artifact originating from the right femoral surgical hardware. Other: No simple free fluid ascites. No pneumoperitoneum. No hemoperitoneum. No mesenteric hematoma identified. No organized fluid collection. Musculoskeletal: No significant soft tissue hematoma. No acute pelvic fracture. No spinal fracture. L2-L4 poor posterolateral and interbody fusion surgical hardware. Status post total right hip arthroplasty. 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 Ports and Devices: None. IMPRESSION: 1. No acute intrathoracic, intra-abdominal, intrapelvic traumatic injury. 2.  No acute fracture or traumatic malalignment of the thoracic or lumbar spine. Other imaging findings of potential clinical significance: 1. Cardiomegaly. 2. Bilateral trace, right greater than left, pleural effusions. 3. Large volume hiatal hernia containing the majority of the stomach. 4.  Aortic Atherosclerosis (ICD10-I70.0). Electronically Signed   By: Tish Frederickson M.D.   On: 04/17/2023 20:12   CT HEAD WO CONTRAST ( )  Result Date:  04/17/2023 CLINICAL DATA:  Polytrauma, blunt; Facial trauma, blunt Fall elevated on Eliquis this morning, near syncope for last 3 weeks. Left lower chest pain/left upper abdominal pain. Right facial injury. EXAM: CT HEAD WITHOUT CONTRAST CT MAXILLOFACIAL WITHOUT CONTRAST CT CERVICAL SPINE WITHOUT CONTRAST TECHNIQUE: Multidetector CT imaging of the head, cervical spine, and maxillofacial structures were performed using the standard protocol without intravenous contrast. Multiplanar CT image reconstructions of the cervical spine and maxillofacial structures were also generated. RADIATION DOSE REDUCTION: This exam was performed according to the departmental dose-optimization program which includes automated exposure control, adjustment of the mA and/or kV according to patient size and/or use of iterative reconstruction technique. COMPARISON:  None Available. FINDINGS: CT HEAD FINDINGS Brain: Cerebral ventricle sizes are concordant with the degree of cerebral volume loss. No evidence of large-territorial acute infarction. No parenchymal hemorrhage. No mass lesion. No extra-axial collection. No mass effect or midline shift. No hydrocephalus. Basilar cisterns are patent. Vascular: No hyperdense vessel. Skull: No acute fracture or focal lesion. Other: Dwan Fennel 1.3 cm right periorbital/frontal scalp hematoma formation. CT MAXILLOFACIAL FINDINGS Osseous: No fracture or mandibular dislocation. No destructive process. Sinuses/Orbits: Surgical changes of the sinuses. Paranasal sinuses and mastoid air cells are clear. Bilateral lens replacement. Otherwise the orbits are unremarkable. Soft tissues: Negative. CT CERVICAL SPINE FINDINGS Alignment: Reversal of the normal cervical lordosis centered at the C5-C6 level likely due to positioning. Skull base and vertebrae: Multilevel degenerative changes of the spine most prominent at the C5 through C7 levels. No acute fracture. No aggressive appearing focal osseous lesion or focal pathologic  process. Soft tissues and spinal canal: No prevertebral fluid or swelling. No visible canal hematoma. Upper chest: Unremarkable. Other: None. IMPRESSION: 1. No acute intracranial abnormality. 2.  No acute displaced facial fracture. 3. No acute displaced fracture or traumatic listhesis of the cervical spine. Electronically Signed   By: Tish Frederickson M.D.   On: 04/17/2023 19:54   CT Cervical Spine Wo Contrast  Result Date: 04/17/2023 CLINICAL DATA:  Polytrauma, blunt; Facial trauma, blunt Fall elevated on Eliquis this morning, near syncope for last 3 weeks. Left lower chest pain/left upper abdominal pain. Right facial injury. EXAM: CT HEAD WITHOUT CONTRAST CT MAXILLOFACIAL WITHOUT CONTRAST CT CERVICAL SPINE WITHOUT CONTRAST TECHNIQUE: Multidetector CT imaging of the head, cervical spine, and maxillofacial structures were performed using the standard protocol without intravenous contrast. Multiplanar CT image reconstructions of the cervical spine and maxillofacial structures were also generated. RADIATION DOSE REDUCTION: This exam was performed according to the departmental dose-optimization program which includes automated exposure control, adjustment of the mA and/or kV according to patient size and/or use of iterative reconstruction technique. COMPARISON:  None Available. FINDINGS: CT HEAD FINDINGS Brain: Cerebral ventricle sizes are concordant with the degree of cerebral volume loss. No evidence of large-territorial acute infarction. No parenchymal hemorrhage. No mass lesion. No extra-axial collection. No mass effect or midline shift. No hydrocephalus. Basilar cisterns are patent. Vascular: No hyperdense vessel. Skull: No acute fracture or focal lesion. Other: Kerith Sherley 1.3 cm right periorbital/frontal scalp hematoma formation. CT MAXILLOFACIAL FINDINGS Osseous: No fracture or mandibular  dislocation. No destructive process. Sinuses/Orbits: Surgical changes of the sinuses. Paranasal sinuses and mastoid air cells are  clear. Bilateral lens replacement. Otherwise the orbits are unremarkable. Soft tissues: Negative. CT CERVICAL SPINE FINDINGS Alignment: Reversal of the normal cervical lordosis centered at the C5-C6 level likely due to positioning. Skull base and vertebrae: Multilevel degenerative changes of the spine most prominent at the C5 through C7 levels. No acute fracture. No aggressive appearing focal osseous lesion or focal pathologic process. Soft tissues and spinal canal: No prevertebral fluid or swelling. No visible canal hematoma. Upper chest: Unremarkable. Other: None. IMPRESSION: 1. No acute intracranial abnormality. 2.  No acute displaced facial fracture. 3. No acute displaced fracture or traumatic listhesis of the cervical spine. Electronically Signed   By: Tish Frederickson M.D.   On: 04/17/2023 19:54   CT Maxillofacial Wo Contrast  Result Date: 04/17/2023 CLINICAL DATA:  Polytrauma, blunt; Facial trauma, blunt Fall elevated on Eliquis this morning, near syncope for last 3 weeks. Left lower chest pain/left upper abdominal pain. Right facial injury. EXAM: CT HEAD WITHOUT CONTRAST CT MAXILLOFACIAL WITHOUT CONTRAST CT CERVICAL SPINE WITHOUT CONTRAST TECHNIQUE: Multidetector CT imaging of the head, cervical spine, and maxillofacial structures were performed using the standard protocol without intravenous contrast. Multiplanar CT image reconstructions of the cervical spine and maxillofacial structures were also generated. RADIATION DOSE REDUCTION: This exam was performed according to the departmental dose-optimization program which includes automated exposure control, adjustment of the mA and/or kV according to patient size and/or use of iterative reconstruction technique. COMPARISON:  None Available. FINDINGS: CT HEAD FINDINGS Brain: Cerebral ventricle sizes are concordant with the degree of cerebral volume loss. No evidence of large-territorial acute infarction. No parenchymal hemorrhage. No mass lesion. No  extra-axial collection. No mass effect or midline shift. No hydrocephalus. Basilar cisterns are patent. Vascular: No hyperdense vessel. Skull: No acute fracture or focal lesion. Other: Lakyn Alsteen 1.3 cm right periorbital/frontal scalp hematoma formation. CT MAXILLOFACIAL FINDINGS Osseous: No fracture or mandibular dislocation. No destructive process. Sinuses/Orbits: Surgical changes of the sinuses. Paranasal sinuses and mastoid air cells are clear. Bilateral lens replacement. Otherwise the orbits are unremarkable. Soft tissues: Negative. CT CERVICAL SPINE FINDINGS Alignment: Reversal of the normal cervical lordosis centered at the C5-C6 level likely due to positioning. Skull base and vertebrae: Multilevel degenerative changes of the spine most prominent at the C5 through C7 levels. No acute fracture. No aggressive appearing focal osseous lesion or focal pathologic process. Soft tissues and spinal canal: No prevertebral fluid or swelling. No visible canal hematoma. Upper chest: Unremarkable. Other: None. IMPRESSION: 1. No acute intracranial abnormality. 2.  No acute displaced facial fracture. 3. No acute displaced fracture or traumatic listhesis of the cervical spine. Electronically Signed   By: Tish Frederickson M.D.   On: 04/17/2023 19:54   DG Humerus Right  Result Date: 04/17/2023 CLINICAL DATA:  Near syncope, fall.  Rolled out of bed.  Pain. EXAM: RIGHT HUMERUS - 2+ VIEW COMPARISON:  None Available. FINDINGS: There is no evidence of fracture or other focal bone lesions. Cortical margins of the humerus are intact. Soft tissues are unremarkable. IMPRESSION: Negative radiographs of the right humerus. Electronically Signed   By: Narda Rutherford M.D.   On: 04/17/2023 17:29   DG Chest Portable 1 View  Result Date: 04/17/2023 CLINICAL DATA:  Syncope, fall. EXAM: PORTABLE CHEST 1 VIEW COMPARISON:  04/12/2023 FINDINGS: Stable cardiomegaly. Unchanged mediastinal contours. Retrocardiac hiatal hernia, moderate to large in  size. Slight vascular congestion without edema. No pleural effusion  or pneumothorax. No acute osseous findings. IMPRESSION: 1. Cardiomegaly with slight vascular congestion. 2. Moderate to large hiatal hernia. Electronically Signed   By: Narda Rutherford M.D.   On: 04/17/2023 17:28        Scheduled Meds:  amLODipine  5 mg Oral Daily   citalopram  40 mg Oral Daily   clonazePAM  1 mg Oral QHS   gabapentin  600 mg Oral q morning   And   gabapentin  1,200 mg Oral QHS   levothyroxine  150 mcg Oral Daily   metoprolol tartrate  25 mg Oral Daily   pantoprazole (PROTONIX) IV  40 mg Intravenous Q12H   Continuous Infusions:   LOS: 0 days    Time spent: over 30 min    Lacretia Nicks, MD [facility]   To contact the attending provider between 7A-7P or the covering provider during after hours 7P-7A, please log into the web site [email or web address] and access using universal [place] password for that web site. If you do not have the password, please call the hospital operator.  04/18/2023, 5:27 PM

## 2023-04-18 NOTE — Assessment & Plan Note (Signed)
-  holding antihypertensives with acute anemia requiring transfusion

## 2023-04-18 NOTE — H&P (View-Only) (Signed)
Consultation  Primary Care Physician:  Jodi Marble, NP Primary Gastroenterologist:  Kateri Mc ( Dr. Norma Fredrickson) Reason for Consultation: melena, anemia DOA: 04/17/2023         Hospital Day: 2         HPI:   Stacy Allen is a 82 y.o. female with past medical history significant for  CAD with RCA stent, COPD , hypothyroidism, cdiff, previous DVT (on Eliquis), hypertension, hyperlipidemia, CHF, osteoporosis, hysterectomy, cholecystectomy, previous rectocele repair, and fibromyalgia who presents with compliant of 3 weeks with episodes of lightheadedness, fatigue, melena, near syncope followed by a fall from home hitting her head.   Work up notable in ED for: Hemoglobin 5.5, previous was 9.9 on 11/2022.  Platelets 332.  TSH 0.222.  Lactic acid 0.9.  BNP 339.5.  Positive fecal occult blood.  Normal kidney function. Afebrile, normotensive on room air. Received 2 units PRBC's 10/21-10/22.  Imaging:  10/21 Chest x-ray shows cardiomegaly with slight vascular congestion.  Moderate to large hiatal hernia 10/21 CT chest/abdomen/pelvis shows heart enlargement.  No mediastinal hematoma.  Volume hiatal hernia containing majority of the stomach. No acute intrathoracic, intra-abdominal, intrapelvic traumatic injury.Bilateral trace, right greater than left, pleural effusions.  10/21 Xray humerus negative CT head/cervical/ maxillofacial- No acute intracranial abnormality. No acute displaced facial fracture. No acute displaced fracture or traumatic listhesis of the cervical spine.  Pt lying in bed, nephew at bedside. Pt reports she has had black tarry stool for the past 3-4 weeks. Her norm is 1-2 unformed stools per day but reports she has had urge to go to the bathroom more frequently the last 24 hours. Last dark stool was about 20 minutes before I walked in. She reports increased dizziness, sob, and fatigue. She reports rolling out of her bed early yesterday morning and hitting her head on the floor.  Hematoma to right eye and forehead. A&Ox4. She reports taking Eliquis for hx of DVT and last dose was 10/21 around 10am. She admits to mild nausea. Denies hematemesis. She has history of GERD and takes Omeprazole at home. She takes occasional Naproxen for arthritis. No c/o dysphagia. Some gas and bloat and mild abd discomfort. No alcohol use. Former smoker. Confirms last EGD was 11/2022. Last colonoscopy per patient over 10 years. Admits to having H/A.   Previous GI workup:   12/08/2022 with Dr. Elfredia Nevins  for Epigastric abdominal pain, Nausea with vomiting  Impression:  - Duodenitis.  - 10 cm hiatal hernia.  - Multiple gastric polyps.  - Normal mucosa was found in the entire stomach. Biopsied.  - Z- line regular. - Esophagogastric landmarks identified.   Bx negative for H. Pylori  04/17/2023 CT chest/abd/pelvis IMPRESSION: 1. No acute intrathoracic, intra-abdominal, intrapelvic traumatic injury. 2. No acute fracture or traumatic malalignment of the thoracic or lumbar spine.   Other imaging findings of potential clinical significance:   1. Cardiomegaly. 2. Bilateral trace, right greater than left, pleural effusions. 3. Large volume hiatal hernia containing the majority of the stomach. 4.  Aortic Atherosclerosis (ICD10-I70.0).   Past Medical History:  Diagnosis Date   Adenomatous polyps    Allergic state    Anemia    Anginal pain (HCC)    Anxiety    Arthritis    osteoarthritis   Broken rib    left   Cancer (HCC)    skin   CHF (congestive heart failure) (HCC)    Chicken pox    Complication of anesthesia  respiratory distress after rectocele surgery   Coronary artery disease    Depression    Eczema    Fibromyalgia    GERD (gastroesophageal reflux disease)    Headache    migraines   Hemorrhoids    History of hiatal hernia    Hypercalcemia    Hyperlipidemia    Hypertension    Hypothyroidism    Lumbar stenosis    Obesity    Peptic ulcer disease     Shortness of breath dyspnea    Sleep apnea    No CPAP   Vertigo    Vitamin D deficiency     Surgical History:  She  has a past surgical history that includes Abdominal hysterectomy; Cardiac catheterization (N/A, 04/03/2015); Back surgery (2007); Cholecystectomy; Fracture surgery (Left, 1991); Coronary angioplasty (2015); Image guided sinus surgery (Bilateral, 09/28/2015); Esophagogastroduodenoscopy (egd) with propofol (N/A, 12/21/2016); Rectocele repair; Cataract extraction w/PHACO (Left, 04/22/2019); Cataract extraction w/PHACO (Right, 07/01/2019); Hip Arthroplasty (Right, 05/07/2020); Eye surgery; Esophagogastroduodenoscopy (egd) with propofol (N/A, 12/08/2022); and LEFT HEART CATH AND CORONARY ANGIOGRAPHY (Left, 01/19/2023). Family History:  Her family history includes Cancer in her mother; Diabetes in her father; Heart failure in her father; Hypertension in her father and mother. Social History:   reports that she quit smoking about 24 years ago. Her smoking use included cigarettes. She has never used smokeless tobacco. She reports that she does not drink alcohol and does not use drugs.  Prior to Admission medications   Medication Sig Start Date End Date Taking? Authorizing Provider  acetaminophen (TYLENOL) 500 MG tablet Take 500 mg by mouth every 6 (six) hours as needed.   Yes [provider]  albuterol (PROVENTIL HFA;VENTOLIN HFA) 108 (90 BASE) MCG/ACT inhaler Inhale 2 puffs into the lungs.   Yes [provider]  amLODipine (NORVASC) 10 MG tablet Take 0.5 tablets (5 mg total) by mouth daily. 10/23/21  Yes Lurene Shadow, MD  amoxicillin (AMOXIL) 500 MG capsule Take 1 capsule (500 mg total) by mouth 2 (two) times daily for 7 days. 04/12/23 04/19/23 Yes White, Elita Boone, NP  apixaban (ELIQUIS) 5 MG TABS tablet Take 1 tablet (5 mg total) by mouth 2 (two) times daily. Take 2 tablets (10mg ) twice daily for 7 days, then 1 tablet (5mg ) twice daily Patient taking differently: Take  5 mg by mouth 2 (two) times daily. Then 1 tablet (5mg ) twice daily 10/23/21  Yes Lurene Shadow, MD  carboxymethylcellulose (REFRESH PLUS) 0.5 % SOLN Place 1 drop into both eyes daily as needed (dry eyes).   Yes [provider]  citalopram (CELEXA) 40 MG tablet Take 40 mg by mouth daily.   Yes [provider]  clonazePAM (KLONOPIN) 1 MG tablet Take 1 mg by mouth at bedtime.   Yes [provider]  EPINEPHrine 0.3 mg/0.3 mL IJ SOAJ injection Inject 0.3 mg into the muscle once. 04/20/20  Yes [provider]  FEROSUL 325 (65 Fe) MG tablet Take 325 mg by mouth daily as needed (when tolerable). 01/26/23  Yes [provider]  Fluticasone-Salmeterol (ADVAIR HFA IN) Inhale 1 puff into the lungs daily as needed.   Yes [provider]  gabapentin (NEURONTIN) 600 MG tablet Take 600-1,200 mg by mouth See admin instructions. Take 1 tablet (600mg ) by mouth in the morning and 2 tablets (1200mg ) by mouth at bedtime 04/24/20  Yes [provider]  levothyroxine (SYNTHROID) 150 MCG tablet Take 150 mcg by mouth daily. 09/15/21  Yes [provider]  metoprolol tartrate (LOPRESSOR) 25  MG tablet Take 1 tablet (25 mg total) by mouth daily. 10/23/21  Yes Lurene Shadow, MD  omeprazole (PRILOSEC) 20 MG capsule Take 40 mg by mouth daily.    Yes [provider]  ondansetron (ZOFRAN-ODT) 4 MG disintegrating tablet Take 1 tablet (4 mg total) by mouth every 8 (eight) hours as needed for nausea or vomiting. 04/12/23  Yes White, Adrienne R, NP  tiZANidine (ZANAFLEX) 4 MG tablet Take 4 mg by mouth at bedtime as needed for muscle spasms. Only uses 1/4 of medication 01/23/23  Yes [provider]  loratadine (CLARITIN) 10 MG tablet Take 1 tablet (10 mg total) by mouth daily. Take 1 tablet in the morning. As needed for itching. 02/14/17 02/17/19  Hassan Rowan, MD  ranitidine (ZANTAC) 150 MG capsule Take 1 capsule (150 mg total) by mouth 2 (two) times daily.  02/14/17 02/17/19  Hassan Rowan, MD    Current Facility-Administered Medications  Medication Dose Route Frequency Provider Last Rate Last Admin   acetaminophen (TYLENOL) tablet 500 mg  500 mg Oral Q6H PRN Tu, Ching T, DO   500 mg at 04/18/23 0654   citalopram (CELEXA) tablet 40 mg  40 mg Oral Daily Tu, Ching T, DO   40 mg at 04/18/23 1610   clonazePAM (KLONOPIN) tablet 1 mg  1 mg Oral QHS Tu, Ching T, DO   1 mg at 04/18/23 0150   gabapentin (NEURONTIN) capsule 600 mg  600 mg Oral q morning Tu, Ching T, DO   600 mg at 04/18/23 9604   And   gabapentin (NEURONTIN) capsule 1,200 mg  1,200 mg Oral QHS Tu, Ching T, DO       levothyroxine (SYNTHROID) tablet 150 mcg  150 mcg Oral Daily Tu, Ching T, DO   150 mcg at 04/18/23 0616   pantoprazole (PROTONIX) injection 40 mg  40 mg Intravenous Q24H Tu, Ching T, DO   40 mg at 04/18/23 0148   Current Outpatient Medications  Medication Sig Dispense Refill   acetaminophen (TYLENOL) 500 MG tablet Take 500 mg by mouth every 6 (six) hours as needed.     albuterol (PROVENTIL HFA;VENTOLIN HFA) 108 (90 BASE) MCG/ACT inhaler Inhale 2 puffs into the lungs.     amLODipine (NORVASC) 10 MG tablet Take 0.5 tablets (5 mg total) by mouth daily.     amoxicillin (AMOXIL) 500 MG capsule Take 1 capsule (500 mg total) by mouth 2 (two) times daily for 7 days. 14 capsule 0   apixaban (ELIQUIS) 5 MG TABS tablet Take 1 tablet (5 mg total) by mouth 2 (two) times daily. Take 2 tablets (10mg ) twice daily for 7 days, then 1 tablet (5mg ) twice daily (Patient taking differently: Take 5 mg by mouth 2 (two) times daily. Then 1 tablet (5mg ) twice daily) 60 tablet 2   carboxymethylcellulose (REFRESH PLUS) 0.5 % SOLN Place 1 drop into both eyes daily as needed (dry eyes).     citalopram (CELEXA) 40 MG tablet Take 40 mg by mouth daily.     clonazePAM (KLONOPIN) 1 MG tablet Take 1 mg by mouth at bedtime.     EPINEPHrine 0.3 mg/0.3 mL IJ SOAJ injection Inject 0.3 mg into the muscle once.      FEROSUL 325 (65 Fe) MG tablet Take 325 mg by mouth daily as needed (when tolerable).     Fluticasone-Salmeterol (ADVAIR HFA IN) Inhale 1 puff into the lungs daily as needed.     gabapentin (NEURONTIN) 600 MG tablet Take 600-1,200 mg by mouth  See admin instructions. Take 1 tablet (600mg ) by mouth in the morning and 2 tablets (1200mg ) by mouth at bedtime     levothyroxine (SYNTHROID) 150 MCG tablet Take 150 mcg by mouth daily.     metoprolol tartrate (LOPRESSOR) 25 MG tablet Take 1 tablet (25 mg total) by mouth daily.     omeprazole (PRILOSEC) 20 MG capsule Take 40 mg by mouth daily.      ondansetron (ZOFRAN-ODT) 4 MG disintegrating tablet Take 1 tablet (4 mg total) by mouth every 8 (eight) hours as needed for nausea or vomiting. 20 tablet 0   tiZANidine (ZANAFLEX) 4 MG tablet Take 4 mg by mouth at bedtime as needed for muscle spasms. Only uses 1/4 of medication      Allergies as of 04/17/2023 - Review Complete 04/17/2023  Allergen Reaction Noted   Azithromycin Diarrhea and Rash 05/03/2011   Meloxicam Diarrhea 04/15/2020   Morphine Other (See Comments) and Rash 05/03/2011   Adhesive [tape]  12/20/2016   Codeine Nausea And Vomiting 04/02/2015   Lactose intolerance (gi) Other (See Comments) 04/02/2015   Red dye #40 (allura red) Itching 09/22/2015   Shellfish-derived products Itching and Swelling 04/02/2015   Aspirin Rash 03/11/2022   Latex Rash 09/23/2015   Lodine [etodolac] Rash 04/02/2015   Morphine and codeine Hives and Nausea And Vomiting 05/03/2011   Penicillins Rash and Hives 12/21/2018   Pravastatin Rash 05/06/2019    Review of Systems:    Constitutional: No weight loss, fever, chills, weakness or fatigue HEENT: Eyes: No change in vision               Ears, Nose, Throat:  No change in hearing or congestion Skin: No rash or itching Cardiovascular: No chest pain, chest pressure or palpitations   Respiratory: No SOB or cough Gastrointestinal: See HPI and otherwise  negative Genitourinary: No dysuria or change in urinary frequency Neurological: No headache, dizziness or syncope Musculoskeletal: No new muscle or joint pain Hematologic: No bleeding or bruising Psychiatric: No history of depression or anxiety     Physical Exam:  Vital signs in last 24 hours: Temp:  [98 F (36.7 C)-98.5 F (36.9 C)] 98.2 F (36.8 C) (10/22 1130) Pulse Rate:  [62-79] 73 (10/22 1212) Resp:  [15-24] 23 (10/22 1212) BP: (139-188)/(56-98) 188/77 (10/22 1212) SpO2:  [96 %-100 %] 99 % (10/22 1212) Weight:  [161 kg] 108 kg (10/21 1648)   Last BM recorded by nurses in past 5 days No data recorded  General:   Pleasant, well developed female in no acute distress Head:  Hematoma to right forehead Eyes: sclerae anicteric,bruising right eye Heart:  regular rate and rhythm, no murmurs or gallops Pulm: Clear anteriorly; no wheezing, on RA Abdomen:  Soft, Obese AB, Hypoactive bowel sounds. No tenderness . Without guarding and Without rebound, No organomegaly appreciated. Rectal: External hemorrhoid, non bleeding. No blood in vault with DRE. Extremities:  Without edema. Msk:  Symmetrical without gross deformities. Peripheral pulses intact.  Neurologic:  Alert and  oriented x4;  No focal deficits.  Skin:   Dry and intact without significant lesions or rashes. Psychiatric:  Cooperative. Normal mood and affect.  LAB RESULTS: Recent Labs    04/17/23 1733 04/18/23 0322  WBC 8.8 7.0  HGB 5.5* 7.9*  HCT 19.6* 26.1*  PLT 332 271   BMET Recent Labs    04/17/23 1733 04/18/23 0322  NA 136 135  K 3.8 3.7  CL 101 100  CO2 26 27  GLUCOSE 97 103*  BUN 18 12  CREATININE 0.81 0.72  CALCIUM 8.3* 8.3*   LFT Recent Labs    04/17/23 1733  PROT 6.0*  ALBUMIN 3.1*  AST 12*  ALT 13  ALKPHOS 55  BILITOT 0.7   PT/INR No results for input(s): "LABPROT", "INR" in the last 72 hours.  STUDIES: CT CHEST ABDOMEN PELVIS W CONTRAST  Result Date: 04/17/2023 CLINICAL DATA:   Polytrauma, blunt Fall elevated on Eliquis this morning, near syncope for last 3 weeks. Left lower chest pain/left upper abdominal pain. Right facial injury. EXAM: CT CHEST, ABDOMEN, AND PELVIS WITH CONTRAST TECHNIQUE: Multidetector CT imaging of the chest, abdomen and pelvis was performed following the standard protocol during bolus administration of intravenous contrast. RADIATION DOSE REDUCTION: This exam was performed according to the departmental dose-optimization program which includes automated exposure control, adjustment of the mA and/or kV according to patient size and/or use of iterative reconstruction technique. CONTRAST:  75mL OMNIPAQUE IOHEXOL 350 MG/ML SOLN COMPARISON:  None Available. FINDINGS: CHEST: Cardiovascular: No aortic injury. The thoracic aorta is normal in caliber. The heart is enlarged in size. No significant pericardial effusion. At least 3 vessel coronary calcification. Mild atherosclerotic plaque. Mediastinum/Nodes: No pneumomediastinum. No mediastinal hematoma. The esophagus is unremarkable. Large volume hiatal hernia containing the majority of the stomach. The thyroid is unremarkable. The central airways are patent. No mediastinal, hilar, or axillary lymphadenopathy. Lungs/Pleura: No focal consolidation. No pulmonary nodule. No pulmonary mass. No pulmonary contusion or laceration. No pneumatocele formation. Bilateral trace, right greater than left, pleural effusions. No pneumothorax. No hemothorax. Musculoskeletal/Chest wall: No chest wall mass. No acute rib or sternal fracture. No spinal fracture. Old healed left rib fractures. ABDOMEN / PELVIS: Hepatobiliary: Not enlarged. No focal lesion. No laceration or subcapsular hematoma. Status post cholecystectomy.  No biliary ductal dilatation. Pancreas: Atrophic parenchyma. Otherwise normal pancreatic contour. No main pancreatic duct dilatation. Spleen: Not enlarged. No focal lesion. No laceration, subcapsular hematoma, or vascular injury.  Adrenals/Urinary Tract: No nodularity bilaterally. Bilateral kidneys enhance symmetrically. No hydronephrosis. No contusion, laceration, or subcapsular hematoma. No injury to the vascular structures or collecting systems. No hydroureter. The urinary bladder is unremarkable. Stomach/Bowel: No small or large bowel wall thickening or dilatation. Status post appendectomy. Vasculature/Lymphatics: Moderate atherosclerotic plaque. No abdominal aorta or iliac aneurysm. No active contrast extravasation or pseudoaneurysm. No abdominal, pelvic, inguinal lymphadenopathy. Reproductive: Status post hysterectomy with limited evaluation due to streak artifact originating from the right femoral surgical hardware. Other: No simple free fluid ascites. No pneumoperitoneum. No hemoperitoneum. No mesenteric hematoma identified. No organized fluid collection. Musculoskeletal: No significant soft tissue hematoma. No acute pelvic fracture. No spinal fracture. L2-L4 poor posterolateral and interbody fusion surgical hardware. Status post total right hip arthroplasty. 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 Ports and Devices: None. IMPRESSION: 1. No acute intrathoracic, intra-abdominal, intrapelvic traumatic injury. 2. No acute fracture or traumatic malalignment of the thoracic or lumbar spine. Other imaging findings of potential clinical significance: 1. Cardiomegaly. 2. Bilateral trace, right greater than left, pleural effusions. 3. Large volume hiatal hernia containing the majority of the stomach. 4.  Aortic Atherosclerosis (ICD10-I70.0). Electronically Signed   By: Tish Frederickson M.D.   On: 04/17/2023 20:12   CT HEAD WO CONTRAST ( )  Result Date: 04/17/2023 CLINICAL DATA:  Polytrauma, blunt; Facial trauma, blunt Fall elevated on Eliquis this morning, near syncope for last 3 weeks. Left lower chest pain/left upper abdominal pain. Right facial injury. EXAM: CT HEAD WITHOUT CONTRAST CT MAXILLOFACIAL WITHOUT CONTRAST CT  CERVICAL  SPINE WITHOUT CONTRAST TECHNIQUE: Multidetector CT imaging of the head, cervical spine, and maxillofacial structures were performed using the standard protocol without intravenous contrast. Multiplanar CT image reconstructions of the cervical spine and maxillofacial structures were also generated. RADIATION DOSE REDUCTION: This exam was performed according to the departmental dose-optimization program which includes automated exposure control, adjustment of the mA and/or kV according to patient size and/or use of iterative reconstruction technique. COMPARISON:  None Available. FINDINGS: CT HEAD FINDINGS Brain: Cerebral ventricle sizes are concordant with the degree of cerebral volume loss. No evidence of large-territorial acute infarction. No parenchymal hemorrhage. No mass lesion. No extra-axial collection. No mass effect or midline shift. No hydrocephalus. Basilar cisterns are patent. Vascular: No hyperdense vessel. Skull: No acute fracture or focal lesion. Other: A 1.3 cm right periorbital/frontal scalp hematoma formation. CT MAXILLOFACIAL FINDINGS Osseous: No fracture or mandibular dislocation. No destructive process. Sinuses/Orbits: Surgical changes of the sinuses. Paranasal sinuses and mastoid air cells are clear. Bilateral lens replacement. Otherwise the orbits are unremarkable. Soft tissues: Negative. CT CERVICAL SPINE FINDINGS Alignment: Reversal of the normal cervical lordosis centered at the C5-C6 level likely due to positioning. Skull base and vertebrae: Multilevel degenerative changes of the spine most prominent at the C5 through C7 levels. No acute fracture. No aggressive appearing focal osseous lesion or focal pathologic process. Soft tissues and spinal canal: No prevertebral fluid or swelling. No visible canal hematoma. Upper chest: Unremarkable. Other: None. IMPRESSION: 1. No acute intracranial abnormality. 2.  No acute displaced facial fracture. 3. No acute displaced fracture or traumatic  listhesis of the cervical spine. Electronically Signed   By: Tish Frederickson M.D.   On: 04/17/2023 19:54   CT Cervical Spine Wo Contrast  Result Date: 04/17/2023 CLINICAL DATA:  Polytrauma, blunt; Facial trauma, blunt Fall elevated on Eliquis this morning, near syncope for last 3 weeks. Left lower chest pain/left upper abdominal pain. Right facial injury. EXAM: CT HEAD WITHOUT CONTRAST CT MAXILLOFACIAL WITHOUT CONTRAST CT CERVICAL SPINE WITHOUT CONTRAST TECHNIQUE: Multidetector CT imaging of the head, cervical spine, and maxillofacial structures were performed using the standard protocol without intravenous contrast. Multiplanar CT image reconstructions of the cervical spine and maxillofacial structures were also generated. RADIATION DOSE REDUCTION: This exam was performed according to the departmental dose-optimization program which includes automated exposure control, adjustment of the mA and/or kV according to patient size and/or use of iterative reconstruction technique. COMPARISON:  None Available. FINDINGS: CT HEAD FINDINGS Brain: Cerebral ventricle sizes are concordant with the degree of cerebral volume loss. No evidence of large-territorial acute infarction. No parenchymal hemorrhage. No mass lesion. No extra-axial collection. No mass effect or midline shift. No hydrocephalus. Basilar cisterns are patent. Vascular: No hyperdense vessel. Skull: No acute fracture or focal lesion. Other: A 1.3 cm right periorbital/frontal scalp hematoma formation. CT MAXILLOFACIAL FINDINGS Osseous: No fracture or mandibular dislocation. No destructive process. Sinuses/Orbits: Surgical changes of the sinuses. Paranasal sinuses and mastoid air cells are clear. Bilateral lens replacement. Otherwise the orbits are unremarkable. Soft tissues: Negative. CT CERVICAL SPINE FINDINGS Alignment: Reversal of the normal cervical lordosis centered at the C5-C6 level likely due to positioning. Skull base and vertebrae: Multilevel  degenerative changes of the spine most prominent at the C5 through C7 levels. No acute fracture. No aggressive appearing focal osseous lesion or focal pathologic process. Soft tissues and spinal canal: No prevertebral fluid or swelling. No visible canal hematoma. Upper chest: Unremarkable. Other: None. IMPRESSION: 1. No acute intracranial abnormality. 2.  No acute displaced facial fracture.  3. No acute displaced fracture or traumatic listhesis of the cervical spine. Electronically Signed   By: Tish Frederickson M.D.   On: 04/17/2023 19:54   CT Maxillofacial Wo Contrast  Result Date: 04/17/2023 CLINICAL DATA:  Polytrauma, blunt; Facial trauma, blunt Fall elevated on Eliquis this morning, near syncope for last 3 weeks. Left lower chest pain/left upper abdominal pain. Right facial injury. EXAM: CT HEAD WITHOUT CONTRAST CT MAXILLOFACIAL WITHOUT CONTRAST CT CERVICAL SPINE WITHOUT CONTRAST TECHNIQUE: Multidetector CT imaging of the head, cervical spine, and maxillofacial structures were performed using the standard protocol without intravenous contrast. Multiplanar CT image reconstructions of the cervical spine and maxillofacial structures were also generated. RADIATION DOSE REDUCTION: This exam was performed according to the departmental dose-optimization program which includes automated exposure control, adjustment of the mA and/or kV according to patient size and/or use of iterative reconstruction technique. COMPARISON:  None Available. FINDINGS: CT HEAD FINDINGS Brain: Cerebral ventricle sizes are concordant with the degree of cerebral volume loss. No evidence of large-territorial acute infarction. No parenchymal hemorrhage. No mass lesion. No extra-axial collection. No mass effect or midline shift. No hydrocephalus. Basilar cisterns are patent. Vascular: No hyperdense vessel. Skull: No acute fracture or focal lesion. Other: A 1.3 cm right periorbital/frontal scalp hematoma formation. CT MAXILLOFACIAL FINDINGS  Osseous: No fracture or mandibular dislocation. No destructive process. Sinuses/Orbits: Surgical changes of the sinuses. Paranasal sinuses and mastoid air cells are clear. Bilateral lens replacement. Otherwise the orbits are unremarkable. Soft tissues: Negative. CT CERVICAL SPINE FINDINGS Alignment: Reversal of the normal cervical lordosis centered at the C5-C6 level likely due to positioning. Skull base and vertebrae: Multilevel degenerative changes of the spine most prominent at the C5 through C7 levels. No acute fracture. No aggressive appearing focal osseous lesion or focal pathologic process. Soft tissues and spinal canal: No prevertebral fluid or swelling. No visible canal hematoma. Upper chest: Unremarkable. Other: None. IMPRESSION: 1. No acute intracranial abnormality. 2.  No acute displaced facial fracture. 3. No acute displaced fracture or traumatic listhesis of the cervical spine. Electronically Signed   By: Tish Frederickson M.D.   On: 04/17/2023 19:54   DG Humerus Right  Result Date: 04/17/2023 CLINICAL DATA:  Near syncope, fall.  Rolled out of bed.  Pain. EXAM: RIGHT HUMERUS - 2+ VIEW COMPARISON:  None Available. FINDINGS: There is no evidence of fracture or other focal bone lesions. Cortical margins of the humerus are intact. Soft tissues are unremarkable. IMPRESSION: Negative radiographs of the right humerus. Electronically Signed   By: Narda Rutherford M.D.   On: 04/17/2023 17:29   DG Chest Portable 1 View  Result Date: 04/17/2023 CLINICAL DATA:  Syncope, fall. EXAM: PORTABLE CHEST 1 VIEW COMPARISON:  04/12/2023 FINDINGS: Stable cardiomegaly. Unchanged mediastinal contours. Retrocardiac hiatal hernia, moderate to large in size. Slight vascular congestion without edema. No pleural effusion or pneumothorax. No acute osseous findings. IMPRESSION: 1. Cardiomegaly with slight vascular congestion. 2. Moderate to large hiatal hernia. Electronically Signed   By: Narda Rutherford M.D.   On:  04/17/2023 17:28      Impression /Plan:   83 year old female patient that reports to ED after falling out of her bed and hitting her head along with symptoms of fatigue, lightheadedness, and dizziness.  Upon admission patient was found to have a hemoglobin of 5.5.  Also reports melena for 3 to 4 weeks.  On Eliquis for history of DVT.  Acute on chronic normocytic anemia secondary to suspected lower GI bleed. Reports of dark tarry stool 3-4  weeks. On OAC for DVT, stopped 10/21 at 1000. EGD 11/2022 with duodenitis and 10cm hiatal hernia. R/O gastritis, Cameron erosions, NSAID induced ulcers. Today hgb 7.9 from 5.5 on admission (10/21), received 2 units PRBCs, from prior of 9.9 (11/2022) BUN 12/Creat 0.72 - CL diet today, NPO after MN -Holding Eliquis -Changed Pantoprazole 40mg  IV to BID -Trend H/H, transfuse as needed -Check Iron panel, ferritin - Plan for EGD tomorrow. I thoroughly discussed the procedure to include nature, alternatives, benefits, and risks including but not limited to bleeding, perforation, infection, anesthesia/cardiac and pulmonary complications. Patient provides understanding and gave verbal consent to proceed. -Not interested in colonoscopy if warranted.  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 Denies recent abx use. No new medications. Ct scan 10/21 shows no small or large bowel wall thickening or dilatation.  -Check Mg level -Recommend probiotics  -Consider bile salt medication or antispasmodic if no improvement  History of DVT . Last dose on 10/21 at 1000. -Eliquis on hold  COPD- on Room air  CHF. 09/17/30 Echo EF >55%. BNP 339.5.   Hypothyroidism- on synthroid. TSH 0.222.  Other comorbidities: Hypertension , obesity  Principal Problem:   Acute GI bleeding Active Problems:   Essential hypertension   Hypothyroidism   History of DVT (deep vein thrombosis)    Anxiety   Obesity, Class III, BMI 40-49.9 (morbid obesity) (HCC)    LOS: 0 days   Thank you for your kind consultation, we will continue to follow.   Stacy Allen  04/18/2023, 12:47 PM

## 2023-04-18 NOTE — Assessment & Plan Note (Signed)
-  holding Eliquis with acute GI bleed.l

## 2023-04-18 NOTE — Consult Note (Addendum)
Consultation  Primary Care Physician:  Jodi Marble, NP Primary Gastroenterologist:  Kateri Mc ( Dr. Norma Fredrickson) Reason for Consultation: melena, anemia DOA: 04/17/2023         Hospital Day: 2         HPI:   Stacy Allen is a 82 y.o. female with past medical history significant for  CAD with RCA stent, COPD , hypothyroidism, cdiff, previous DVT (on Eliquis), hypertension, hyperlipidemia, CHF, osteoporosis, hysterectomy, cholecystectomy, previous rectocele repair, and fibromyalgia who presents with compliant of 3 weeks with episodes of lightheadedness, fatigue, melena, near syncope followed by a fall from home hitting her head.   Work up notable in ED for: Hemoglobin 5.5, previous was 9.9 on 11/2022.  Platelets 332.  TSH 0.222.  Lactic acid 0.9.  BNP 339.5.  Positive fecal occult blood.  Normal kidney function. Afebrile, normotensive on room air. Received 2 units PRBC's 10/21-10/22.  Imaging:  10/21 Chest x-ray shows cardiomegaly with slight vascular congestion.  Moderate to large hiatal hernia 10/21 CT chest/abdomen/pelvis shows heart enlargement.  No mediastinal hematoma.  Volume hiatal hernia containing majority of the stomach. No acute intrathoracic, intra-abdominal, intrapelvic traumatic injury.Bilateral trace, right greater than left, pleural effusions.  10/21 Xray humerus negative CT head/cervical/ maxillofacial- No acute intracranial abnormality. No acute displaced facial fracture. No acute displaced fracture or traumatic listhesis of the cervical spine.  Pt lying in bed, nephew at bedside. Pt reports she has had black tarry stool for the past 3-4 weeks. Her norm is 1-2 unformed stools per day but reports she has had urge to go to the bathroom more frequently the last 24 hours. Last dark stool was about 20 minutes before I walked in. She reports increased dizziness, sob, and fatigue. She reports rolling out of her bed early yesterday morning and hitting her head on the floor.  Hematoma to right eye and forehead. A&Ox4. She reports taking Eliquis for hx of DVT and last dose was 10/21 around 10am. She admits to mild nausea. Denies hematemesis. She has history of GERD and takes Omeprazole at home. She takes occasional Naproxen for arthritis. No c/o dysphagia. Some gas and bloat and mild abd discomfort. No alcohol use. Former smoker. Confirms last EGD was 11/2022. Last colonoscopy per patient over 10 years. Admits to having H/A.   Previous GI workup:   12/08/2022 with Dr. Elfredia Nevins  for Epigastric abdominal pain, Nausea with vomiting  Impression:  - Duodenitis.  - 10 cm hiatal hernia.  - Multiple gastric polyps.  - Normal mucosa was found in the entire stomach. Biopsied.  - Z- line regular. - Esophagogastric landmarks identified.   Bx negative for H. Pylori  04/17/2023 CT chest/abd/pelvis IMPRESSION: 1. No acute intrathoracic, intra-abdominal, intrapelvic traumatic injury. 2. No acute fracture or traumatic malalignment of the thoracic or lumbar spine.   Other imaging findings of potential clinical significance:   1. Cardiomegaly. 2. Bilateral trace, right greater than left, pleural effusions. 3. Large volume hiatal hernia containing the majority of the stomach. 4.  Aortic Atherosclerosis (ICD10-I70.0).   Past Medical History:  Diagnosis Date   Adenomatous polyps    Allergic state    Anemia    Anginal pain (HCC)    Anxiety    Arthritis    osteoarthritis   Broken rib    left   Cancer (HCC)    skin   CHF (congestive heart failure) (HCC)    Chicken pox    Complication of anesthesia  respiratory distress after rectocele surgery   Coronary artery disease    Depression    Eczema    Fibromyalgia    GERD (gastroesophageal reflux disease)    Headache    migraines   Hemorrhoids    History of hiatal hernia    Hypercalcemia    Hyperlipidemia    Hypertension    Hypothyroidism    Lumbar stenosis    Obesity    Peptic ulcer disease     Shortness of breath dyspnea    Sleep apnea    No CPAP   Vertigo    Vitamin D deficiency     Surgical History:  She  has a past surgical history that includes Abdominal hysterectomy; Cardiac catheterization (N/A, 04/03/2015); Back surgery (2007); Cholecystectomy; Fracture surgery (Left, 1991); Coronary angioplasty (2015); Image guided sinus surgery (Bilateral, 09/28/2015); Esophagogastroduodenoscopy (egd) with propofol (N/A, 12/21/2016); Rectocele repair; Cataract extraction w/PHACO (Left, 04/22/2019); Cataract extraction w/PHACO (Right, 07/01/2019); Hip Arthroplasty (Right, 05/07/2020); Eye surgery; Esophagogastroduodenoscopy (egd) with propofol (N/A, 12/08/2022); and LEFT HEART CATH AND CORONARY ANGIOGRAPHY (Left, 01/19/2023). Family History:  Her family history includes Cancer in her mother; Diabetes in her father; Heart failure in her father; Hypertension in her father and mother. Social History:   reports that she quit smoking about 24 years ago. Her smoking use included cigarettes. She has never used smokeless tobacco. She reports that she does not drink alcohol and does not use drugs.  Prior to Admission medications   Medication Sig Start Date End Date Taking? Authorizing Provider  acetaminophen (TYLENOL) 500 MG tablet Take 500 mg by mouth every 6 (six) hours as needed.   Yes [provider]  albuterol (PROVENTIL HFA;VENTOLIN HFA) 108 (90 BASE) MCG/ACT inhaler Inhale 2 puffs into the lungs.   Yes [provider]  amLODipine (NORVASC) 10 MG tablet Take 0.5 tablets (5 mg total) by mouth daily. 10/23/21  Yes Lurene Shadow, MD  amoxicillin (AMOXIL) 500 MG capsule Take 1 capsule (500 mg total) by mouth 2 (two) times daily for 7 days. 04/12/23 04/19/23 Yes White, Elita Boone, NP  apixaban (ELIQUIS) 5 MG TABS tablet Take 1 tablet (5 mg total) by mouth 2 (two) times daily. Take 2 tablets (10mg ) twice daily for 7 days, then 1 tablet (5mg ) twice daily Patient taking differently: Take  5 mg by mouth 2 (two) times daily. Then 1 tablet (5mg ) twice daily 10/23/21  Yes Lurene Shadow, MD  carboxymethylcellulose (REFRESH PLUS) 0.5 % SOLN Place 1 drop into both eyes daily as needed (dry eyes).   Yes [provider]  citalopram (CELEXA) 40 MG tablet Take 40 mg by mouth daily.   Yes [provider]  clonazePAM (KLONOPIN) 1 MG tablet Take 1 mg by mouth at bedtime.   Yes [provider]  EPINEPHrine 0.3 mg/0.3 mL IJ SOAJ injection Inject 0.3 mg into the muscle once. 04/20/20  Yes [provider]  FEROSUL 325 (65 Fe) MG tablet Take 325 mg by mouth daily as needed (when tolerable). 01/26/23  Yes [provider]  Fluticasone-Salmeterol (ADVAIR HFA IN) Inhale 1 puff into the lungs daily as needed.   Yes [provider]  gabapentin (NEURONTIN) 600 MG tablet Take 600-1,200 mg by mouth See admin instructions. Take 1 tablet (600mg ) by mouth in the morning and 2 tablets (1200mg ) by mouth at bedtime 04/24/20  Yes [provider]  levothyroxine (SYNTHROID) 150 MCG tablet Take 150 mcg by mouth daily. 09/15/21  Yes [provider]  metoprolol tartrate (LOPRESSOR) 25  MG tablet Take 1 tablet (25 mg total) by mouth daily. 10/23/21  Yes Lurene Shadow, MD  omeprazole (PRILOSEC) 20 MG capsule Take 40 mg by mouth daily.    Yes [provider]  ondansetron (ZOFRAN-ODT) 4 MG disintegrating tablet Take 1 tablet (4 mg total) by mouth every 8 (eight) hours as needed for nausea or vomiting. 04/12/23  Yes White, Adrienne R, NP  tiZANidine (ZANAFLEX) 4 MG tablet Take 4 mg by mouth at bedtime as needed for muscle spasms. Only uses 1/4 of medication 01/23/23  Yes [provider]  loratadine (CLARITIN) 10 MG tablet Take 1 tablet (10 mg total) by mouth daily. Take 1 tablet in the morning. As needed for itching. 02/14/17 02/17/19  Hassan Rowan, MD  ranitidine (ZANTAC) 150 MG capsule Take 1 capsule (150 mg total) by mouth 2 (two) times daily.  02/14/17 02/17/19  Hassan Rowan, MD    Current Facility-Administered Medications  Medication Dose Route Frequency Provider Last Rate Last Admin   acetaminophen (TYLENOL) tablet 500 mg  500 mg Oral Q6H PRN Tu, Ching T, DO   500 mg at 04/18/23 0654   citalopram (CELEXA) tablet 40 mg  40 mg Oral Daily Tu, Ching T, DO   40 mg at 04/18/23 1610   clonazePAM (KLONOPIN) tablet 1 mg  1 mg Oral QHS Tu, Ching T, DO   1 mg at 04/18/23 0150   gabapentin (NEURONTIN) capsule 600 mg  600 mg Oral q morning Tu, Ching T, DO   600 mg at 04/18/23 9604   And   gabapentin (NEURONTIN) capsule 1,200 mg  1,200 mg Oral QHS Tu, Ching T, DO       levothyroxine (SYNTHROID) tablet 150 mcg  150 mcg Oral Daily Tu, Ching T, DO   150 mcg at 04/18/23 0616   pantoprazole (PROTONIX) injection 40 mg  40 mg Intravenous Q24H Tu, Ching T, DO   40 mg at 04/18/23 0148   Current Outpatient Medications  Medication Sig Dispense Refill   acetaminophen (TYLENOL) 500 MG tablet Take 500 mg by mouth every 6 (six) hours as needed.     albuterol (PROVENTIL HFA;VENTOLIN HFA) 108 (90 BASE) MCG/ACT inhaler Inhale 2 puffs into the lungs.     amLODipine (NORVASC) 10 MG tablet Take 0.5 tablets (5 mg total) by mouth daily.     amoxicillin (AMOXIL) 500 MG capsule Take 1 capsule (500 mg total) by mouth 2 (two) times daily for 7 days. 14 capsule 0   apixaban (ELIQUIS) 5 MG TABS tablet Take 1 tablet (5 mg total) by mouth 2 (two) times daily. Take 2 tablets (10mg ) twice daily for 7 days, then 1 tablet (5mg ) twice daily (Patient taking differently: Take 5 mg by mouth 2 (two) times daily. Then 1 tablet (5mg ) twice daily) 60 tablet 2   carboxymethylcellulose (REFRESH PLUS) 0.5 % SOLN Place 1 drop into both eyes daily as needed (dry eyes).     citalopram (CELEXA) 40 MG tablet Take 40 mg by mouth daily.     clonazePAM (KLONOPIN) 1 MG tablet Take 1 mg by mouth at bedtime.     EPINEPHrine 0.3 mg/0.3 mL IJ SOAJ injection Inject 0.3 mg into the muscle once.      FEROSUL 325 (65 Fe) MG tablet Take 325 mg by mouth daily as needed (when tolerable).     Fluticasone-Salmeterol (ADVAIR HFA IN) Inhale 1 puff into the lungs daily as needed.     gabapentin (NEURONTIN) 600 MG tablet Take 600-1,200 mg by mouth  See admin instructions. Take 1 tablet (600mg ) by mouth in the morning and 2 tablets (1200mg ) by mouth at bedtime     levothyroxine (SYNTHROID) 150 MCG tablet Take 150 mcg by mouth daily.     metoprolol tartrate (LOPRESSOR) 25 MG tablet Take 1 tablet (25 mg total) by mouth daily.     omeprazole (PRILOSEC) 20 MG capsule Take 40 mg by mouth daily.      ondansetron (ZOFRAN-ODT) 4 MG disintegrating tablet Take 1 tablet (4 mg total) by mouth every 8 (eight) hours as needed for nausea or vomiting. 20 tablet 0   tiZANidine (ZANAFLEX) 4 MG tablet Take 4 mg by mouth at bedtime as needed for muscle spasms. Only uses 1/4 of medication      Allergies as of 04/17/2023 - Review Complete 04/17/2023  Allergen Reaction Noted   Azithromycin Diarrhea and Rash 05/03/2011   Meloxicam Diarrhea 04/15/2020   Morphine Other (See Comments) and Rash 05/03/2011   Adhesive [tape]  12/20/2016   Codeine Nausea And Vomiting 04/02/2015   Lactose intolerance (gi) Other (See Comments) 04/02/2015   Red dye #40 (allura red) Itching 09/22/2015   Shellfish-derived products Itching and Swelling 04/02/2015   Aspirin Rash 03/11/2022   Latex Rash 09/23/2015   Lodine [etodolac] Rash 04/02/2015   Morphine and codeine Hives and Nausea And Vomiting 05/03/2011   Penicillins Rash and Hives 12/21/2018   Pravastatin Rash 05/06/2019    Review of Systems:    Constitutional: No weight loss, fever, chills, weakness or fatigue HEENT: Eyes: No change in vision               Ears, Nose, Throat:  No change in hearing or congestion Skin: No rash or itching Cardiovascular: No chest pain, chest pressure or palpitations   Respiratory: No SOB or cough Gastrointestinal: See HPI and otherwise  negative Genitourinary: No dysuria or change in urinary frequency Neurological: No headache, dizziness or syncope Musculoskeletal: No new muscle or joint pain Hematologic: No bleeding or bruising Psychiatric: No history of depression or anxiety     Physical Exam:  Vital signs in last 24 hours: Temp:  [98 F (36.7 C)-98.5 F (36.9 C)] 98.2 F (36.8 C) (10/22 1130) Pulse Rate:  [62-79] 73 (10/22 1212) Resp:  [15-24] 23 (10/22 1212) BP: (139-188)/(56-98) 188/77 (10/22 1212) SpO2:  [96 %-100 %] 99 % (10/22 1212) Weight:  [161 kg] 108 kg (10/21 1648)   Last BM recorded by nurses in past 5 days No data recorded  General:   Pleasant, well developed female in no acute distress Head:  Hematoma to right forehead Eyes: sclerae anicteric,bruising right eye Heart:  regular rate and rhythm, no murmurs or gallops Pulm: Clear anteriorly; no wheezing, on RA Abdomen:  Soft, Obese AB, Hypoactive bowel sounds. No tenderness . Without guarding and Without rebound, No organomegaly appreciated. Rectal: External hemorrhoid, non bleeding. No blood in vault with DRE. Extremities:  Without edema. Msk:  Symmetrical without gross deformities. Peripheral pulses intact.  Neurologic:  Alert and  oriented x4;  No focal deficits.  Skin:   Dry and intact without significant lesions or rashes. Psychiatric:  Cooperative. Normal mood and affect.  LAB RESULTS: Recent Labs    04/17/23 1733 04/18/23 0322  WBC 8.8 7.0  HGB 5.5* 7.9*  HCT 19.6* 26.1*  PLT 332 271   BMET Recent Labs    04/17/23 1733 04/18/23 0322  NA 136 135  K 3.8 3.7  CL 101 100  CO2 26 27  GLUCOSE 97 103*  BUN 18 12  CREATININE 0.81 0.72  CALCIUM 8.3* 8.3*   LFT Recent Labs    04/17/23 1733  PROT 6.0*  ALBUMIN 3.1*  AST 12*  ALT 13  ALKPHOS 55  BILITOT 0.7   PT/INR No results for input(s): "LABPROT", "INR" in the last 72 hours.  STUDIES: CT CHEST ABDOMEN PELVIS W CONTRAST  Result Date: 04/17/2023 CLINICAL DATA:   Polytrauma, blunt Fall elevated on Eliquis this morning, near syncope for last 3 weeks. Left lower chest pain/left upper abdominal pain. Right facial injury. EXAM: CT CHEST, ABDOMEN, AND PELVIS WITH CONTRAST TECHNIQUE: Multidetector CT imaging of the chest, abdomen and pelvis was performed following the standard protocol during bolus administration of intravenous contrast. RADIATION DOSE REDUCTION: This exam was performed according to the departmental dose-optimization program which includes automated exposure control, adjustment of the mA and/or kV according to patient size and/or use of iterative reconstruction technique. CONTRAST:  75mL OMNIPAQUE IOHEXOL 350 MG/ML SOLN COMPARISON:  None Available. FINDINGS: CHEST: Cardiovascular: No aortic injury. The thoracic aorta is normal in caliber. The heart is enlarged in size. No significant pericardial effusion. At least 3 vessel coronary calcification. Mild atherosclerotic plaque. Mediastinum/Nodes: No pneumomediastinum. No mediastinal hematoma. The esophagus is unremarkable. Large volume hiatal hernia containing the majority of the stomach. The thyroid is unremarkable. The central airways are patent. No mediastinal, hilar, or axillary lymphadenopathy. Lungs/Pleura: No focal consolidation. No pulmonary nodule. No pulmonary mass. No pulmonary contusion or laceration. No pneumatocele formation. Bilateral trace, right greater than left, pleural effusions. No pneumothorax. No hemothorax. Musculoskeletal/Chest wall: No chest wall mass. No acute rib or sternal fracture. No spinal fracture. Old healed left rib fractures. ABDOMEN / PELVIS: Hepatobiliary: Not enlarged. No focal lesion. No laceration or subcapsular hematoma. Status post cholecystectomy.  No biliary ductal dilatation. Pancreas: Atrophic parenchyma. Otherwise normal pancreatic contour. No main pancreatic duct dilatation. Spleen: Not enlarged. No focal lesion. No laceration, subcapsular hematoma, or vascular injury.  Adrenals/Urinary Tract: No nodularity bilaterally. Bilateral kidneys enhance symmetrically. No hydronephrosis. No contusion, laceration, or subcapsular hematoma. No injury to the vascular structures or collecting systems. No hydroureter. The urinary bladder is unremarkable. Stomach/Bowel: No small or large bowel wall thickening or dilatation. Status post appendectomy. Vasculature/Lymphatics: Moderate atherosclerotic plaque. No abdominal aorta or iliac aneurysm. No active contrast extravasation or pseudoaneurysm. No abdominal, pelvic, inguinal lymphadenopathy. Reproductive: Status post hysterectomy with limited evaluation due to streak artifact originating from the right femoral surgical hardware. Other: No simple free fluid ascites. No pneumoperitoneum. No hemoperitoneum. No mesenteric hematoma identified. No organized fluid collection. Musculoskeletal: No significant soft tissue hematoma. No acute pelvic fracture. No spinal fracture. L2-L4 poor posterolateral and interbody fusion surgical hardware. Status post total right hip arthroplasty. 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 Ports and Devices: None. IMPRESSION: 1. No acute intrathoracic, intra-abdominal, intrapelvic traumatic injury. 2. No acute fracture or traumatic malalignment of the thoracic or lumbar spine. Other imaging findings of potential clinical significance: 1. Cardiomegaly. 2. Bilateral trace, right greater than left, pleural effusions. 3. Large volume hiatal hernia containing the majority of the stomach. 4.  Aortic Atherosclerosis (ICD10-I70.0). Electronically Signed   By: Tish Frederickson M.D.   On: 04/17/2023 20:12   CT HEAD WO CONTRAST ( )  Result Date: 04/17/2023 CLINICAL DATA:  Polytrauma, blunt; Facial trauma, blunt Fall elevated on Eliquis this morning, near syncope for last 3 weeks. Left lower chest pain/left upper abdominal pain. Right facial injury. EXAM: CT HEAD WITHOUT CONTRAST CT MAXILLOFACIAL WITHOUT CONTRAST CT  CERVICAL  SPINE WITHOUT CONTRAST TECHNIQUE: Multidetector CT imaging of the head, cervical spine, and maxillofacial structures were performed using the standard protocol without intravenous contrast. Multiplanar CT image reconstructions of the cervical spine and maxillofacial structures were also generated. RADIATION DOSE REDUCTION: This exam was performed according to the departmental dose-optimization program which includes automated exposure control, adjustment of the mA and/or kV according to patient size and/or use of iterative reconstruction technique. COMPARISON:  None Available. FINDINGS: CT HEAD FINDINGS Brain: Cerebral ventricle sizes are concordant with the degree of cerebral volume loss. No evidence of large-territorial acute infarction. No parenchymal hemorrhage. No mass lesion. No extra-axial collection. No mass effect or midline shift. No hydrocephalus. Basilar cisterns are patent. Vascular: No hyperdense vessel. Skull: No acute fracture or focal lesion. Other: A 1.3 cm right periorbital/frontal scalp hematoma formation. CT MAXILLOFACIAL FINDINGS Osseous: No fracture or mandibular dislocation. No destructive process. Sinuses/Orbits: Surgical changes of the sinuses. Paranasal sinuses and mastoid air cells are clear. Bilateral lens replacement. Otherwise the orbits are unremarkable. Soft tissues: Negative. CT CERVICAL SPINE FINDINGS Alignment: Reversal of the normal cervical lordosis centered at the C5-C6 level likely due to positioning. Skull base and vertebrae: Multilevel degenerative changes of the spine most prominent at the C5 through C7 levels. No acute fracture. No aggressive appearing focal osseous lesion or focal pathologic process. Soft tissues and spinal canal: No prevertebral fluid or swelling. No visible canal hematoma. Upper chest: Unremarkable. Other: None. IMPRESSION: 1. No acute intracranial abnormality. 2.  No acute displaced facial fracture. 3. No acute displaced fracture or traumatic  listhesis of the cervical spine. Electronically Signed   By: Tish Frederickson M.D.   On: 04/17/2023 19:54   CT Cervical Spine Wo Contrast  Result Date: 04/17/2023 CLINICAL DATA:  Polytrauma, blunt; Facial trauma, blunt Fall elevated on Eliquis this morning, near syncope for last 3 weeks. Left lower chest pain/left upper abdominal pain. Right facial injury. EXAM: CT HEAD WITHOUT CONTRAST CT MAXILLOFACIAL WITHOUT CONTRAST CT CERVICAL SPINE WITHOUT CONTRAST TECHNIQUE: Multidetector CT imaging of the head, cervical spine, and maxillofacial structures were performed using the standard protocol without intravenous contrast. Multiplanar CT image reconstructions of the cervical spine and maxillofacial structures were also generated. RADIATION DOSE REDUCTION: This exam was performed according to the departmental dose-optimization program which includes automated exposure control, adjustment of the mA and/or kV according to patient size and/or use of iterative reconstruction technique. COMPARISON:  None Available. FINDINGS: CT HEAD FINDINGS Brain: Cerebral ventricle sizes are concordant with the degree of cerebral volume loss. No evidence of large-territorial acute infarction. No parenchymal hemorrhage. No mass lesion. No extra-axial collection. No mass effect or midline shift. No hydrocephalus. Basilar cisterns are patent. Vascular: No hyperdense vessel. Skull: No acute fracture or focal lesion. Other: A 1.3 cm right periorbital/frontal scalp hematoma formation. CT MAXILLOFACIAL FINDINGS Osseous: No fracture or mandibular dislocation. No destructive process. Sinuses/Orbits: Surgical changes of the sinuses. Paranasal sinuses and mastoid air cells are clear. Bilateral lens replacement. Otherwise the orbits are unremarkable. Soft tissues: Negative. CT CERVICAL SPINE FINDINGS Alignment: Reversal of the normal cervical lordosis centered at the C5-C6 level likely due to positioning. Skull base and vertebrae: Multilevel  degenerative changes of the spine most prominent at the C5 through C7 levels. No acute fracture. No aggressive appearing focal osseous lesion or focal pathologic process. Soft tissues and spinal canal: No prevertebral fluid or swelling. No visible canal hematoma. Upper chest: Unremarkable. Other: None. IMPRESSION: 1. No acute intracranial abnormality. 2.  No acute displaced facial fracture.  3. No acute displaced fracture or traumatic listhesis of the cervical spine. Electronically Signed   By: Tish Frederickson M.D.   On: 04/17/2023 19:54   CT Maxillofacial Wo Contrast  Result Date: 04/17/2023 CLINICAL DATA:  Polytrauma, blunt; Facial trauma, blunt Fall elevated on Eliquis this morning, near syncope for last 3 weeks. Left lower chest pain/left upper abdominal pain. Right facial injury. EXAM: CT HEAD WITHOUT CONTRAST CT MAXILLOFACIAL WITHOUT CONTRAST CT CERVICAL SPINE WITHOUT CONTRAST TECHNIQUE: Multidetector CT imaging of the head, cervical spine, and maxillofacial structures were performed using the standard protocol without intravenous contrast. Multiplanar CT image reconstructions of the cervical spine and maxillofacial structures were also generated. RADIATION DOSE REDUCTION: This exam was performed according to the departmental dose-optimization program which includes automated exposure control, adjustment of the mA and/or kV according to patient size and/or use of iterative reconstruction technique. COMPARISON:  None Available. FINDINGS: CT HEAD FINDINGS Brain: Cerebral ventricle sizes are concordant with the degree of cerebral volume loss. No evidence of large-territorial acute infarction. No parenchymal hemorrhage. No mass lesion. No extra-axial collection. No mass effect or midline shift. No hydrocephalus. Basilar cisterns are patent. Vascular: No hyperdense vessel. Skull: No acute fracture or focal lesion. Other: A 1.3 cm right periorbital/frontal scalp hematoma formation. CT MAXILLOFACIAL FINDINGS  Osseous: No fracture or mandibular dislocation. No destructive process. Sinuses/Orbits: Surgical changes of the sinuses. Paranasal sinuses and mastoid air cells are clear. Bilateral lens replacement. Otherwise the orbits are unremarkable. Soft tissues: Negative. CT CERVICAL SPINE FINDINGS Alignment: Reversal of the normal cervical lordosis centered at the C5-C6 level likely due to positioning. Skull base and vertebrae: Multilevel degenerative changes of the spine most prominent at the C5 through C7 levels. No acute fracture. No aggressive appearing focal osseous lesion or focal pathologic process. Soft tissues and spinal canal: No prevertebral fluid or swelling. No visible canal hematoma. Upper chest: Unremarkable. Other: None. IMPRESSION: 1. No acute intracranial abnormality. 2.  No acute displaced facial fracture. 3. No acute displaced fracture or traumatic listhesis of the cervical spine. Electronically Signed   By: Tish Frederickson M.D.   On: 04/17/2023 19:54   DG Humerus Right  Result Date: 04/17/2023 CLINICAL DATA:  Near syncope, fall.  Rolled out of bed.  Pain. EXAM: RIGHT HUMERUS - 2+ VIEW COMPARISON:  None Available. FINDINGS: There is no evidence of fracture or other focal bone lesions. Cortical margins of the humerus are intact. Soft tissues are unremarkable. IMPRESSION: Negative radiographs of the right humerus. Electronically Signed   By: Narda Rutherford M.D.   On: 04/17/2023 17:29   DG Chest Portable 1 View  Result Date: 04/17/2023 CLINICAL DATA:  Syncope, fall. EXAM: PORTABLE CHEST 1 VIEW COMPARISON:  04/12/2023 FINDINGS: Stable cardiomegaly. Unchanged mediastinal contours. Retrocardiac hiatal hernia, moderate to large in size. Slight vascular congestion without edema. No pleural effusion or pneumothorax. No acute osseous findings. IMPRESSION: 1. Cardiomegaly with slight vascular congestion. 2. Moderate to large hiatal hernia. Electronically Signed   By: Narda Rutherford M.D.   On:  04/17/2023 17:28      Impression /Plan:   83 year old female patient that reports to ED after falling out of her bed and hitting her head along with symptoms of fatigue, lightheadedness, and dizziness.  Upon admission patient was found to have a hemoglobin of 5.5.  Also reports melena for 3 to 4 weeks.  On Eliquis for history of DVT.  Acute on chronic normocytic anemia secondary to suspected lower GI bleed. Reports of dark tarry stool 3-4  weeks. On OAC for DVT, stopped 10/21 at 1000. EGD 11/2022 with duodenitis and 10cm hiatal hernia. R/O gastritis, Cameron erosions, NSAID induced ulcers. Today hgb 7.9 from 5.5 on admission (10/21), received 2 units PRBCs, from prior of 9.9 (11/2022) BUN 12/Creat 0.72 - CL diet today, NPO after MN -Holding Eliquis -Changed Pantoprazole 40mg  IV to BID -Trend H/H, transfuse as needed -Check Iron panel, ferritin - Plan for EGD tomorrow. I thoroughly discussed the procedure to include nature, alternatives, benefits, and risks including but not limited to bleeding, perforation, infection, anesthesia/cardiac and pulmonary complications. Patient provides understanding and gave verbal consent to proceed. -Not interested in colonoscopy if warranted.  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 Denies recent abx use. No new medications. Ct scan 10/21 shows no small or large bowel wall thickening or dilatation.  -Check Mg level -Recommend probiotics  -Consider bile salt medication or antispasmodic if no improvement  History of DVT . Last dose on 10/21 at 1000. -Eliquis on hold  COPD- on Room air  CHF. 09/17/30 Echo EF >55%. BNP 339.5.   Hypothyroidism- on synthroid. TSH 0.222.  Other comorbidities: Hypertension , obesity  Principal Problem:   Acute GI bleeding Active Problems:   Essential hypertension   Hypothyroidism   History of DVT (deep vein thrombosis)    Anxiety   Obesity, Class III, BMI 40-49.9 (morbid obesity) (HCC)    LOS: 0 days   Thank you for your kind consultation, we will continue to follow.   Lou Irigoyen J Temekia Caskey  04/18/2023, 12:47 PM

## 2023-04-18 NOTE — Assessment & Plan Note (Signed)
BMI of 42 

## 2023-04-18 NOTE — ED Provider Notes (Signed)
  Provider Note MRN:  [id number]  Arrival date & time: 04/18/23    ED Course and Medical Decision Making  Assumed care from Dr Tegeler at shift change.  See note from prior team for complete details, in brief:  82 year old female, arrived as FOT Imaging stable, found with hemoglobin 5.5, heme+ stool.[name]  PRBC ordered.  Awaiting callback/admission from hospitalist  Pt HDS on recheck, blood products infusing, no complaints  Dr Cyndia Bent taking over care        .Critical Care  Performed by: Sloan Leiter, DO Authorized by: Sloan Leiter, DO   Critical care provider statement:    Critical care time (minutes):  30   Critical care time was exclusive of:  Separately billable procedures and treating other patients   Critical care was necessary to treat or prevent imminent or life-threatening deterioration of the following conditions:  Circulatory failure   Critical care was time spent personally by me on the following activities:  Development of treatment plan with patient or surrogate, discussions with consultants, evaluation of patient's response to treatment, examination of patient, ordering and review of laboratory studies, ordering and review of radiographic studies, ordering and performing treatments and interventions, pulse oximetry, re-evaluation of patient's condition, review of old charts and obtaining history from patient or surrogate   Final Clinical Impressions(s) / ED Diagnoses     ICD-10-CM   1. Fall, initial encounter  W19.XXXA     2. Symptomatic anemia  D64.9     3. Gastrointestinal hemorrhage, unspecified gastrointestinal hemorrhage type  K92.2       ED Discharge Orders     None       Discharge Instructions   None        Sloan Leiter, DO 04/18/23 0113

## 2023-04-18 NOTE — Plan of Care (Signed)
  Problem: Education: Goal: Knowledge of General Education information will improve Description: Including pain rating scale, medication(s)/side effects and non-pharmacologic comfort measures Outcome: Progressing   Problem: Health Behavior/Discharge Planning: Goal: Ability to manage health-related needs will improve Outcome: Progressing   Problem: Clinical Measurements: Goal: Ability to maintain clinical measurements within normal limits will improve Outcome: Progressing Goal: Will remain free from infection Outcome: Progressing Goal: Diagnostic test results will improve Outcome: Progressing Goal: Respiratory complications will improve Outcome: Progressing Goal: Cardiovascular complication will be avoided Outcome: Progressing   Problem: Activity: Goal: Risk for activity intolerance will decrease Outcome: Progressing   Problem: Nutrition: Goal: Adequate nutrition will be maintained Outcome: Progressing   Problem: Coping: Goal: Level of anxiety will decrease Outcome: Progressing   Problem: Elimination: Goal: Will not experience complications related to bowel motility Outcome: Progressing Goal: Will not experience complications related to urinary retention Outcome: Progressing   Problem: Pain Management: Goal: General experience of comfort will improve Outcome: Progressing   Problem: Safety: Goal: Ability to remain free from injury will improve Outcome: Progressing   Problem: Skin Integrity: Goal: Risk for impaired skin integrity will decrease Outcome: Progressing   Problem: Education: Goal: Knowledge of General Education information will improve Description: Including pain rating scale, medication(s)/side effects and non-pharmacologic comfort measures Outcome: Progressing   Problem: Health Behavior/Discharge Planning: Goal: Ability to manage health-related needs will improve Outcome: Progressing   Problem: Clinical Measurements: Goal: Ability to maintain  clinical measurements within normal limits will improve Outcome: Progressing   Problem: Clinical Measurements: Goal: Will remain free from infection Outcome: Progressing   Problem: Clinical Measurements: Goal: Diagnostic test results will improve Outcome: Progressing   Problem: Clinical Measurements: Goal: Respiratory complications will improve Outcome: Progressing   Problem: Clinical Measurements: Goal: Cardiovascular complication will be avoided Outcome: Progressing   Problem: Activity: Goal: Risk for activity intolerance will decrease Outcome: Progressing   Problem: Nutrition: Goal: Adequate nutrition will be maintained Outcome: Progressing   Problem: Coping: Goal: Level of anxiety will decrease Outcome: Progressing   Problem: Elimination: Goal: Will not experience complications related to bowel motility Outcome: Progressing   Problem: Elimination: Goal: Will not experience complications related to urinary retention Outcome: Progressing   Problem: Pain Management: Goal: General experience of comfort will improve Outcome: Progressing   Problem: Safety: Goal: Ability to remain free from injury will improve Outcome: Progressing   Problem: Skin Integrity: Goal: Risk for impaired skin integrity will decrease Outcome: Progressing

## 2023-04-19 ENCOUNTER — Other Ambulatory Visit (HOSPITAL_COMMUNITY): Payer: Medicare HMO

## 2023-04-19 ENCOUNTER — Inpatient Hospital Stay (HOSPITAL_COMMUNITY): Payer: Medicare HMO | Admitting: Certified Registered Nurse Anesthetist

## 2023-04-19 ENCOUNTER — Inpatient Hospital Stay (HOSPITAL_COMMUNITY): Payer: Medicare HMO

## 2023-04-19 ENCOUNTER — Encounter (HOSPITAL_COMMUNITY): Payer: Self-pay | Admitting: Family Medicine

## 2023-04-19 ENCOUNTER — Encounter (HOSPITAL_COMMUNITY)
Admission: EM | Disposition: A | Discharge status: Home Health | Payer: Self-pay | Source: Home / Self Care | Attending: Emergency Medicine

## 2023-04-19 DIAGNOSIS — I2510 Atherosclerotic heart disease of native coronary artery without angina pectoris: Secondary | ICD-10-CM

## 2023-04-19 DIAGNOSIS — K921 Melena: Secondary | ICD-10-CM | POA: Diagnosis not present

## 2023-04-19 DIAGNOSIS — R008 Other abnormalities of heart beat: Secondary | ICD-10-CM | POA: Diagnosis not present

## 2023-04-19 DIAGNOSIS — D649 Anemia, unspecified: Secondary | ICD-10-CM

## 2023-04-19 DIAGNOSIS — Z86718 Personal history of other venous thrombosis and embolism: Secondary | ICD-10-CM | POA: Diagnosis not present

## 2023-04-19 DIAGNOSIS — K317 Polyp of stomach and duodenum: Secondary | ICD-10-CM

## 2023-04-19 DIAGNOSIS — I10 Essential (primary) hypertension: Secondary | ICD-10-CM | POA: Diagnosis not present

## 2023-04-19 DIAGNOSIS — K922 Gastrointestinal hemorrhage, unspecified: Secondary | ICD-10-CM | POA: Diagnosis not present

## 2023-04-19 HISTORY — PX: POLYPECTOMY: SHX5525

## 2023-04-19 HISTORY — PX: ESOPHAGOGASTRODUODENOSCOPY (EGD) WITH PROPOFOL: SHX5813

## 2023-04-19 HISTORY — DX: Melena: K92.1

## 2023-04-19 HISTORY — DX: Polyp of stomach and duodenum: K31.7

## 2023-04-19 HISTORY — PX: HEMOSTASIS CLIP PLACEMENT: SHX6857

## 2023-04-19 LAB — COMPREHENSIVE METABOLIC PANEL
ALT: 12 U/L (ref 0–44)
AST: 10 U/L — ABNORMAL LOW (ref 15–41)
Albumin: 3.1 g/dL — ABNORMAL LOW (ref 3.5–5.0)
Alkaline Phosphatase: 52 U/L (ref 38–126)
Anion gap: 11 (ref 5–15)
BUN: 9 mg/dL (ref 8–23)
CO2: 24 mmol/L (ref 22–32)
Calcium: 8.3 mg/dL — ABNORMAL LOW (ref 8.9–10.3)
Chloride: 99 mmol/L (ref 98–111)
Creatinine, Ser: 0.61 mg/dL (ref 0.44–1.00)
GFR, Estimated: >60 mL/min (ref >60)
Glucose, Bld: 92 mg/dL (ref 70–99)
Potassium: 3.6 mmol/L (ref 3.5–5.1)
Sodium: 134 mmol/L — ABNORMAL LOW (ref 135–145)
Total Bilirubin: 1.8 mg/dL — ABNORMAL HIGH (ref 0.3–1.2)
Total Protein: 5.9 g/dL — ABNORMAL LOW (ref 6.5–8.1)

## 2023-04-19 LAB — FERRITIN: Ferritin: 10 ng/mL — ABNORMAL LOW (ref 11–307)

## 2023-04-19 LAB — ECHOCARDIOGRAM COMPLETE
AR max vel: 1.92 cm2
AV Area VTI: 2.51 cm2
AV Area mean vel: 2.02 cm2
AV Mean grad: 3 mm[Hg]
AV Peak grad: 7.6 mm[Hg]
Ao pk vel: 1.38 m/s
Area-P 1/2: 3.77 cm2
Calc EF: 81.9 %
Height: 63 in
S' Lateral: 3.8 cm
Single Plane A2C EF: 87.8 %
Single Plane A4C EF: 73.7 %
Weight: 3809.55 [oz_av]

## 2023-04-19 LAB — IRON AND TIBC
Iron: 25 ug/dL — ABNORMAL LOW (ref 28–170)
Saturation Ratios: 6 % — ABNORMAL LOW (ref 10.4–31.8)
TIBC: 447 ug/dL (ref 250–450)
UIBC: 422 ug/dL

## 2023-04-19 LAB — CBC
HCT: 26.8 % — ABNORMAL LOW (ref 36.0–46.0)
Hemoglobin: 8 g/dL — ABNORMAL LOW (ref 12.0–15.0)
MCH: 24.8 pg — ABNORMAL LOW (ref 26.0–34.0)
MCHC: 29.9 g/dL — ABNORMAL LOW (ref 30.0–36.0)
MCV: 83 fL (ref 80.0–100.0)
Platelets: 236 10*3/uL (ref 150–400)
RBC: 3.23 MIL/uL — ABNORMAL LOW (ref 3.87–5.11)
RDW: 16.9 % — ABNORMAL HIGH (ref 11.5–15.5)
WBC: 6.2 10*3/uL (ref 4.0–10.5)
nRBC: 0.3 % — ABNORMAL HIGH (ref 0.0–0.2)

## 2023-04-19 LAB — PROTIME-INR
INR: 1.1 (ref 0.8–1.2)
Prothrombin Time: 14.4 s (ref 11.4–15.2)

## 2023-04-19 SURGERY — ESOPHAGOGASTRODUODENOSCOPY (EGD) WITH PROPOFOL
Anesthesia: Monitor Anesthesia Care

## 2023-04-19 MED ORDER — SODIUM CHLORIDE 0.9 % IV SOLN: INTRAVENOUS | Status: DC | PRN

## 2023-04-19 MED ORDER — LIDOCAINE 2% (20 MG/ML) 5 ML SYRINGE
INTRAMUSCULAR | Status: DC | PRN
  Administered 2023-04-19: 60 mg via INTRAVENOUS

## 2023-04-19 MED ORDER — PROPOFOL 500 MG/50ML IV EMUL
INTRAVENOUS | Status: DC | PRN
  Administered 2023-04-19: 100 ug/kg/min via INTRAVENOUS

## 2023-04-19 MED ORDER — PERFLUTREN LIPID MICROSPHERE
1.0000 mL | INTRAVENOUS | Status: AC | PRN
  Administered 2023-04-19: 2 mL via INTRAVENOUS

## 2023-04-19 MED ORDER — PROPOFOL 10 MG/ML IV BOLUS
INTRAVENOUS | Status: DC | PRN
  Administered 2023-04-19: 10 mg via INTRAVENOUS
  Administered 2023-04-19: 15 mg via INTRAVENOUS
  Administered 2023-04-19: 10 mg via INTRAVENOUS

## 2023-04-19 SURGICAL SUPPLY — 14 items
BLOCK BITE 60FR ADLT L/F BLUE (MISCELLANEOUS) ×2
ELECT REM PT RETURN 9FT ADLT (ELECTROSURGICAL)
FORCEP RJ3 GP 1.8X160 W-NEEDLE (CUTTING FORCEPS)
FORCEPS BIOP RAD 4 LRG CAP 4 (CUTTING FORCEPS)
NDL SCLEROTHERAPY 25GX240 (NEEDLE) IMPLANT
NEEDLE SCLEROTHERAPY 25GX240 (NEEDLE)
PROBE APC STR FIRE (PROBE)
PROBE INJECTION GOLD (MISCELLANEOUS)
PROBE INJECTION GOLD 7FR (MISCELLANEOUS)
SNARE SHORT THROW 13M SML OVAL (MISCELLANEOUS)
SYR 50ML LL SCALE MARK (SYRINGE)
TUBING ENDO SMARTCAP PENTAX (MISCELLANEOUS) ×4
TUBING IRRIGATION ENDOGATOR (MISCELLANEOUS) ×2
WATER STERILE IRR 1000ML POUR (IV SOLUTION)

## 2023-04-19 NOTE — Transfer of Care (Signed)
Immediate Anesthesia Transfer of Care Note  Patient: Stacy Allen  Procedure(s) Performed: ESOPHAGOGASTRODUODENOSCOPY (EGD) WITH PROPOFOL POLYPECTOMY HEMOSTASIS CLIP PLACEMENT  Patient Location: Endoscopy Unit  Anesthesia Type:MAC  Level of Consciousness: drowsy and patient cooperative  Airway & Oxygen Therapy: Patient Spontanous Breathing and Patient connected to nasal cannula oxygen  Post-op Assessment: Report given to RN and Post -op Vital signs reviewed and stable  Post vital signs: Reviewed and stable  Last Vitals:  Vitals Value Taken Time  BP 128/70   Temp 97   Pulse 65   Resp 22   SpO2 99%     Last Pain:  Vitals:   04/19/23 1350  TempSrc: Temporal  PainSc: 3          Complications: No notable events documented.

## 2023-04-19 NOTE — Interval H&P Note (Signed)
History and Physical Interval Note: Patient in endo for EGD this afternoon. Hgb stable post transfusion. She reports feeling similar to yesterday. We discussed EGD and anesthesia, she is agreeable. Hopefully we can clarify source of her anemia with this exam. 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 If it is negative she will need a colonoscopy. She is not sure if she could handle that right now. Will await her EGD with further recommendations. Otherwise no interval changes she wishes to proceed.  04/19/2023 2:16 PM  Stacy Allen  has presented today for surgery, with the diagnosis of Anemia, melena.  The various methods of treatment have been discussed with the patient and family. After consideration of risks, benefits and other options for treatment, the patient has consented to  Procedure(s): ESOPHAGOGASTRODUODENOSCOPY (EGD) WITH PROPOFOL (N/A) as a surgical intervention.  The patient's history has been reviewed, patient examined, no change in status, stable for surgery.  I have reviewed the patient's chart and labs.  Questions were answered to the patient's satisfaction.     Viviann Spare P Guadalupe Kerekes

## 2023-04-19 NOTE — Progress Notes (Signed)
PROGRESS NOTE        PATIENT DETAILS Name: Stacy Allen Age: 82 y.o. Sex: female Date of Birth: 14-May-1941 Admit Date: 04/17/2023 Admitting Physician Anselm Jungling, DO YBO:[id number], Edythe Lynn, NP  Brief Summary: Patient is a 82 y.o.  female with history of CAD s/p PCI, recurrent DVT on Eliquis-who presented with a fall-in the setting of melanotic stools and a hemoglobin of 5.5.  She was then admitted to [facility] below for further details.  Significant events: 10/21>> admit to [facility]  Significant studies: 10/21>> x-ray right humerus: No fracture 10/21>> CT chest/abdomen/pelvis: No acute injury. 10/21>> CT head: No acute intracranial injury 10/21>> CT maxillofacial: No fracture 10/21>> CT C-spine: No fracture  Significant microbiology data: 10/21>> urine culture:<10,000 colonies/mL  Procedures: None  Consults: GI  Subjective: Lying comfortably in bed-denies any chest pain or shortness of breath.  No melanotic stools overnight.  Objective: Vitals: Blood pressure (!) 147/96, pulse 76, temperature (!) 97.5 F (36.4 C), temperature source Oral, resp. rate 13, height 5\' 3"  (1.6 m), weight 108 kg, SpO2 95%.   Exam: Gen Exam:Alert awake-not in any distress HEENT:atraumatic, normocephalic Chest: B/L clear to auscultation anteriorly CVS:S1S2 regular Abdomen:soft non tender, non distended Extremities:no edema Neurology: Non focal Skin: no rash  Pertinent Labs/Radiology:    Latest Ref Rng & Units 04/19/2023    3:22 AM 04/18/2023    4:28 PM 04/18/2023    3:22 AM  CBC  WBC 4.0 - 10.5 K/uL 6.2   7.0   Hemoglobin 12.0 - 15.0 g/dL 8.0  7.8  7.9   Hematocrit 36.0 - 46.0 % 26.8  26.3  26.1   Platelets 150 - 400 K/uL 236   271     Lab Results  Component Value Date   NA 134 (L) 04/19/2023   K 3.6 04/19/2023   CL 99 04/19/2023   CO2 24 04/19/2023      Assessment/Plan: Upper GI bleeding with acute blood loss anemia Hb stable after 2 units of PRBC  on admission No further bleeding overnight PPI EGD scheduled for later today Eliquis on hold  Mechanical fall Apparently rolled out of bed-no history of syncope Extensive imaging studies negative Complaining of left shoulder pain-x-ray ordered 10/23 PT/OT eval  History of VTE Eliquis on hold  History of CAD s/p PCI 2007 Recent LHC July 2024 with nonobstructive CAD-patent RCA stent. No general symptoms currently  Chronic HFpEF Euvolemic Echo pending  HTN BP stable Amlodipine/metoprolol  Hypothyroidism Synthroid TSH suppressed-check free T4-likely sick euthyroid syndrome  Mood disorder Stable Celexa/Klonopin  Morbid Obesity: Estimated body mass index is 42.18 kg/m as calculated from the following:   Height as of this encounter: 5\' 3"  (1.6 m).   Weight as of this encounter: 108 kg.   Code status:   Code Status: Full Code   DVT Prophylaxis: SCDs Start: 04/18/23 0110    Family Communication: None at bedside   Disposition Plan: Status is: Inpatient Remains inpatient appropriate because: Severity of illness   Planned Discharge Destination:Home health   Diet: Diet Order             Diet NPO time specified Except for: Sips with Meds  Diet effective midnight                     Antimicrobial agents: Anti-infectives (From admission, onward)    None  MEDICATIONS: Scheduled Meds:  amLODipine  5 mg Oral Daily   citalopram  40 mg Oral Daily   clonazePAM  1 mg Oral QHS   gabapentin  600 mg Oral q morning   And   gabapentin  1,200 mg Oral QHS   levothyroxine  150 mcg Oral Daily   metoprolol tartrate  25 mg Oral Daily   pantoprazole (PROTONIX) IV  40 mg Intravenous Q12H   Continuous Infusions: PRN Meds:.acetaminophen, hydrALAZINE   I have personally reviewed following labs and imaging studies  LABORATORY DATA: CBC: Recent Labs  Lab 04/17/23 1733 04/18/23 0322 04/18/23 1628 04/19/23 0322  WBC 8.8 7.0  --  6.2  NEUTROABS  6.0  --   --   --   HGB 5.5* 7.9* 7.8* 8.0*  HCT 19.6* 26.1* 26.3* 26.8*  MCV 83.4 83.4  --  83.0  PLT 332 271  --  236    Basic Metabolic Panel: Recent Labs  Lab 04/17/23 1733 04/18/23 0322 04/19/23 0322  NA 136 135 134*  K 3.8 3.7 3.6  CL 101 100 99  CO2 26 27 24   GLUCOSE 97 103* 92  BUN 18 12 9   CREATININE 0.81 0.72 0.61  CALCIUM 8.3* 8.3* 8.3*  MG  --  2.1  --     GFR: Estimated Creatinine Clearance: 66.1 mL/min (by C-G formula based on SCr of 0.61 mg/dL).  Liver Function Tests: Recent Labs  Lab 04/17/23 1733 04/19/23 0322  AST 12* 10*  ALT 13 12  ALKPHOS 55 52  BILITOT 0.7 1.8*  PROT 6.0* 5.9*  ALBUMIN 3.1* 3.1*   Recent Labs  Lab 04/17/23 1733  LIPASE 23   No results for input(s): "AMMONIA" in the last 168 hours.  Coagulation Profile: Recent Labs  Lab 04/18/23 1739 04/19/23 0322  INR 1.2 1.1    Cardiac Enzymes: No results for input(s): "CKTOTAL", "CKMB", "CKMBINDEX", "TROPONINI" in the last 168 hours.  BNP (last 3 results) No results for input(s): "PROBNP" in the last 8760 hours.  Lipid Profile: No results for input(s): "CHOL", "HDL", "LDLCALC", "TRIG", "CHOLHDL", "LDLDIRECT" in the last 72 hours.  Thyroid Function Tests: Recent Labs    04/17/23 1733  TSH 0.222*    Anemia Panel: Recent Labs    04/19/23 0322  FERRITIN 10*  TIBC 447  IRON 25*    Urine analysis:    Component Value Date/Time   COLORURINE STRAW (A) 04/17/2023 1808   APPEARANCEUR CLEAR 04/17/2023 1808   APPEARANCEUR CLEAR 07/23/2014 1444   LABSPEC 1.010 04/17/2023 1808   LABSPEC 1.005 07/23/2014 1444   PHURINE 7.0 04/17/2023 1808   GLUCOSEU NEGATIVE 04/17/2023 1808   GLUCOSEU NEGATIVE 07/23/2014 1444   HGBUR NEGATIVE 04/17/2023 1808   BILIRUBINUR NEGATIVE 04/17/2023 1808   BILIRUBINUR NEGATIVE 07/23/2014 1444   KETONESUR NEGATIVE 04/17/2023 1808   PROTEINUR NEGATIVE 04/17/2023 1808   NITRITE NEGATIVE 04/17/2023 1808   LEUKOCYTESUR NEGATIVE 04/17/2023 1808    LEUKOCYTESUR NEGATIVE 07/23/2014 1444    Sepsis Labs: Lactic Acid, Venous    Component Value Date/Time   LATICACIDVEN 0.9 04/17/2023 1733    MICROBIOLOGY: Recent Results (from the past 240 hour(s))  Urine Culture     Status: Abnormal   Collection Time: 04/17/23  7:45 PM   Specimen: Urine, Clean Catch  Result Value Ref Range Status   Specimen Description URINE, CLEAN CATCH  Final   Special Requests NONE  Final   Culture (A)  Final    <10,000 COLONIES/mL INSIGNIFICANT GROWTH Performed at [facility]  [facility], [address] [place], [place] [address]    Report Status 04/18/2023 FINAL  Final    RADIOLOGY STUDIES/RESULTS: CT CHEST ABDOMEN PELVIS W CONTRAST  Result Date: 04/17/2023 CLINICAL DATA:  Polytrauma, blunt Fall elevated on Eliquis this morning, near syncope for last 3 weeks. Left lower chest pain/left upper abdominal pain. Right facial injury. EXAM: CT CHEST, ABDOMEN, AND PELVIS WITH CONTRAST TECHNIQUE: Multidetector CT imaging of the chest, abdomen and pelvis was performed following the standard protocol during bolus administration of intravenous contrast. RADIATION DOSE REDUCTION: This exam was performed according to the departmental dose-optimization program which includes automated exposure control, adjustment of the mA and/or kV according to patient size and/or use of iterative reconstruction technique. CONTRAST:  75mL OMNIPAQUE IOHEXOL 350 MG/ML SOLN COMPARISON:  None Available. FINDINGS: CHEST: Cardiovascular: No aortic injury. The thoracic aorta is normal in caliber. The heart is enlarged in size. No significant pericardial effusion. At least 3 vessel coronary calcification. Mild atherosclerotic plaque. Mediastinum/Nodes: No pneumomediastinum. No mediastinal hematoma. The esophagus is unremarkable. Large volume hiatal hernia containing the majority of the stomach. The thyroid is unremarkable. The central airways are patent. No mediastinal, hilar, or axillary  lymphadenopathy. Lungs/Pleura: No focal consolidation. No pulmonary nodule. No pulmonary mass. No pulmonary contusion or laceration. No pneumatocele formation. Bilateral trace, right greater than left, pleural effusions. No pneumothorax. No hemothorax. Musculoskeletal/Chest wall: No chest wall mass. No acute rib or sternal fracture. No spinal fracture. Old healed left rib fractures. ABDOMEN / PELVIS: Hepatobiliary: Not enlarged. No focal lesion. No laceration or subcapsular hematoma. Status post cholecystectomy.  No biliary ductal dilatation. Pancreas: Atrophic parenchyma. Otherwise normal pancreatic contour. No main pancreatic duct dilatation. Spleen: Not enlarged. No focal lesion. No laceration, subcapsular hematoma, or vascular injury. Adrenals/Urinary Tract: No nodularity bilaterally. Bilateral kidneys enhance symmetrically. No hydronephrosis. No contusion, laceration, or subcapsular hematoma. No injury to the vascular structures or collecting systems. No hydroureter. The urinary bladder is unremarkable. Stomach/Bowel: No small or large bowel wall thickening or dilatation. Status post appendectomy. Vasculature/Lymphatics: Moderate atherosclerotic plaque. No abdominal aorta or iliac aneurysm. No active contrast extravasation or pseudoaneurysm. No abdominal, pelvic, inguinal lymphadenopathy. Reproductive: Status post hysterectomy with limited evaluation due to streak artifact originating from the right femoral surgical hardware. Other: No simple free fluid ascites. No pneumoperitoneum. No hemoperitoneum. No mesenteric hematoma identified. No organized fluid collection. Musculoskeletal: No significant soft tissue hematoma. No acute pelvic fracture. No spinal fracture. L2-L4 poor posterolateral and interbody fusion surgical hardware. Status post total right hip arthroplasty. 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 Ports and Devices: None. IMPRESSION: 1. No acute intrathoracic, intra-abdominal,  intrapelvic traumatic injury. 2. No acute fracture or traumatic malalignment of the thoracic or lumbar spine. Other imaging findings of potential clinical significance: 1. Cardiomegaly. 2. Bilateral trace, right greater than left, pleural effusions. 3. Large volume hiatal hernia containing the majority of the stomach. 4.  Aortic Atherosclerosis (ICD10-I70.0). Electronically Signed   By: Tish Frederickson M.D.   On: 04/17/2023 20:12   CT HEAD WO CONTRAST ( )  Result Date: 04/17/2023 CLINICAL DATA:  Polytrauma, blunt; Facial trauma, blunt Fall elevated on Eliquis this morning, near syncope for last 3 weeks. Left lower chest pain/left upper abdominal pain. Right facial injury. EXAM: CT HEAD WITHOUT CONTRAST CT MAXILLOFACIAL WITHOUT CONTRAST CT CERVICAL SPINE WITHOUT CONTRAST TECHNIQUE: Multidetector CT imaging of the head, cervical spine, and maxillofacial structures were performed using the standard protocol without intravenous contrast. Multiplanar CT image reconstructions of the cervical spine  and maxillofacial structures were also generated. RADIATION DOSE REDUCTION: This exam was performed according to the departmental dose-optimization program which includes automated exposure control, adjustment of the mA and/or kV according to patient size and/or use of iterative reconstruction technique. COMPARISON:  None Available. FINDINGS: CT HEAD FINDINGS Brain: Cerebral ventricle sizes are concordant with the degree of cerebral volume loss. No evidence of large-territorial acute infarction. No parenchymal hemorrhage. No mass lesion. No extra-axial collection. No mass effect or midline shift. No hydrocephalus. Basilar cisterns are patent. Vascular: No hyperdense vessel. Skull: No acute fracture or focal lesion. Other: A 1.3 cm right periorbital/frontal scalp hematoma formation. CT MAXILLOFACIAL FINDINGS Osseous: No fracture or mandibular dislocation. No destructive process. Sinuses/Orbits: Surgical changes of the  sinuses. Paranasal sinuses and mastoid air cells are clear. Bilateral lens replacement. Otherwise the orbits are unremarkable. Soft tissues: Negative. CT CERVICAL SPINE FINDINGS Alignment: Reversal of the normal cervical lordosis centered at the C5-C6 level likely due to positioning. Skull base and vertebrae: Multilevel degenerative changes of the spine most prominent at the C5 through C7 levels. No acute fracture. No aggressive appearing focal osseous lesion or focal pathologic process. Soft tissues and spinal canal: No prevertebral fluid or swelling. No visible canal hematoma. Upper chest: Unremarkable. Other: None. IMPRESSION: 1. No acute intracranial abnormality. 2.  No acute displaced facial fracture. 3. No acute displaced fracture or traumatic listhesis of the cervical spine. Electronically Signed   By: Tish Frederickson M.D.   On: 04/17/2023 19:54   CT Cervical Spine Wo Contrast  Result Date: 04/17/2023 CLINICAL DATA:  Polytrauma, blunt; Facial trauma, blunt Fall elevated on Eliquis this morning, near syncope for last 3 weeks. Left lower chest pain/left upper abdominal pain. Right facial injury. EXAM: CT HEAD WITHOUT CONTRAST CT MAXILLOFACIAL WITHOUT CONTRAST CT CERVICAL SPINE WITHOUT CONTRAST TECHNIQUE: Multidetector CT imaging of the head, cervical spine, and maxillofacial structures were performed using the standard protocol without intravenous contrast. Multiplanar CT image reconstructions of the cervical spine and maxillofacial structures were also generated. RADIATION DOSE REDUCTION: This exam was performed according to the departmental dose-optimization program which includes automated exposure control, adjustment of the mA and/or kV according to patient size and/or use of iterative reconstruction technique. COMPARISON:  None Available. FINDINGS: CT HEAD FINDINGS Brain: Cerebral ventricle sizes are concordant with the degree of cerebral volume loss. No evidence of large-territorial acute infarction.  No parenchymal hemorrhage. No mass lesion. No extra-axial collection. No mass effect or midline shift. No hydrocephalus. Basilar cisterns are patent. Vascular: No hyperdense vessel. Skull: No acute fracture or focal lesion. Other: A 1.3 cm right periorbital/frontal scalp hematoma formation. CT MAXILLOFACIAL FINDINGS Osseous: No fracture or mandibular dislocation. No destructive process. Sinuses/Orbits: Surgical changes of the sinuses. Paranasal sinuses and mastoid air cells are clear. Bilateral lens replacement. Otherwise the orbits are unremarkable. Soft tissues: Negative. CT CERVICAL SPINE FINDINGS Alignment: Reversal of the normal cervical lordosis centered at the C5-C6 level likely due to positioning. Skull base and vertebrae: Multilevel degenerative changes of the spine most prominent at the C5 through C7 levels. No acute fracture. No aggressive appearing focal osseous lesion or focal pathologic process. Soft tissues and spinal canal: No prevertebral fluid or swelling. No visible canal hematoma. Upper chest: Unremarkable. Other: None. IMPRESSION: 1. No acute intracranial abnormality. 2.  No acute displaced facial fracture. 3. No acute displaced fracture or traumatic listhesis of the cervical spine. Electronically Signed   By: Tish Frederickson M.D.   On: 04/17/2023 19:54   CT Maxillofacial Wo Contrast  Result Date: 04/17/2023 CLINICAL DATA:  Polytrauma, blunt; Facial trauma, blunt Fall elevated on Eliquis this morning, near syncope for last 3 weeks. Left lower chest pain/left upper abdominal pain. Right facial injury. EXAM: CT HEAD WITHOUT CONTRAST CT MAXILLOFACIAL WITHOUT CONTRAST CT CERVICAL SPINE WITHOUT CONTRAST TECHNIQUE: Multidetector CT imaging of the head, cervical spine, and maxillofacial structures were performed using the standard protocol without intravenous contrast. Multiplanar CT image reconstructions of the cervical spine and maxillofacial structures were also generated. RADIATION DOSE  REDUCTION: This exam was performed according to the departmental dose-optimization program which includes automated exposure control, adjustment of the mA and/or kV according to patient size and/or use of iterative reconstruction technique. COMPARISON:  None Available. FINDINGS: CT HEAD FINDINGS Brain: Cerebral ventricle sizes are concordant with the degree of cerebral volume loss. No evidence of large-territorial acute infarction. No parenchymal hemorrhage. No mass lesion. No extra-axial collection. No mass effect or midline shift. No hydrocephalus. Basilar cisterns are patent. Vascular: No hyperdense vessel. Skull: No acute fracture or focal lesion. Other: A 1.3 cm right periorbital/frontal scalp hematoma formation. CT MAXILLOFACIAL FINDINGS Osseous: No fracture or mandibular dislocation. No destructive process. Sinuses/Orbits: Surgical changes of the sinuses. Paranasal sinuses and mastoid air cells are clear. Bilateral lens replacement. Otherwise the orbits are unremarkable. Soft tissues: Negative. CT CERVICAL SPINE FINDINGS Alignment: Reversal of the normal cervical lordosis centered at the C5-C6 level likely due to positioning. Skull base and vertebrae: Multilevel degenerative changes of the spine most prominent at the C5 through C7 levels. No acute fracture. No aggressive appearing focal osseous lesion or focal pathologic process. Soft tissues and spinal canal: No prevertebral fluid or swelling. No visible canal hematoma. Upper chest: Unremarkable. Other: None. IMPRESSION: 1. No acute intracranial abnormality. 2.  No acute displaced facial fracture. 3. No acute displaced fracture or traumatic listhesis of the cervical spine. Electronically Signed   By: Tish Frederickson M.D.   On: 04/17/2023 19:54   DG Humerus Right  Result Date: 04/17/2023 CLINICAL DATA:  Near syncope, fall.  Rolled out of bed.  Pain. EXAM: RIGHT HUMERUS - 2+ VIEW COMPARISON:  None Available. FINDINGS: There is no evidence of fracture or  other focal bone lesions. Cortical margins of the humerus are intact. Soft tissues are unremarkable. IMPRESSION: Negative radiographs of the right humerus. Electronically Signed   By: Narda Rutherford M.D.   On: 04/17/2023 17:29   DG Chest Portable 1 View  Result Date: 04/17/2023 CLINICAL DATA:  Syncope, fall. EXAM: PORTABLE CHEST 1 VIEW COMPARISON:  04/12/2023 FINDINGS: Stable cardiomegaly. Unchanged mediastinal contours. Retrocardiac hiatal hernia, moderate to large in size. Slight vascular congestion without edema. No pleural effusion or pneumothorax. No acute osseous findings. IMPRESSION: 1. Cardiomegaly with slight vascular congestion. 2. Moderate to large hiatal hernia. Electronically Signed   By: Narda Rutherford M.D.   On: 04/17/2023 17:28     LOS: 1 day   Jeoffrey Massed, MD  [facility]    To contact the attending provider between 7A-7P or the covering provider during after hours 7P-7A, please log into the web site [email or web address] and access using universal [place] password for that web site. If you do not have the password, please call the hospital operator.  04/19/2023, 11:14 AM

## 2023-04-19 NOTE — Op Note (Signed)
[facility] Patient Name: Stacy Allen Procedure Date : 04/19/2023 MRN: [id number] Attending MD: Willaim Rayas. Adela Lank , MD, [id number] Date of Birth: 02/28/1941 CSN: [id number] Age: 82 Admit Type: Inpatient Procedure:                Upper GI endoscopy Indications:              Melena / dark stools, new anemia, iron deficiency -                            on Eliquis, held for 2 days. History of large                            hiatal hernia. BUN normal. EGD to further evaluate. Providers:                Willaim Rayas. Adela Lank, MD, Stephens Shire RN, RN,                            Kandice Robinsons, Technician Referring MD:              Medicines:                Monitored Anesthesia Care Complications:            No immediate complications. Estimated blood loss:                            Minimal. Estimated Blood Loss:     Estimated blood loss was minimal. Procedure:                Pre-Anesthesia Assessment:                           - Prior to the procedure, a History and Physical                            was performed, and patient medications and                            allergies were reviewed. The patient's tolerance of                            previous anesthesia was also reviewed. The risks                            and benefits of the procedure and the sedation                            options and risks were discussed with the patient.                            All questions were answered, and informed consent                            was obtained. Prior Anticoagulants: The patient has  taken Eliquis (apixaban), last dose was 2 days                            prior to procedure. ASA Grade Assessment: III - A                            patient with severe systemic disease. After                            reviewing the risks and benefits, the patient was                            deemed in satisfactory condition to undergo the                             procedure.                           After obtaining informed consent, the endoscope was                            passed under direct vision. Throughout the                            procedure, the patient's blood pressure, pulse, and                            oxygen saturations were monitored continuously. The                            [id number]-H190 ([id number]) Olympus endoscope was introduced                            through the mouth, and advanced to the second part                            of duodenum. The upper GI endoscopy was                            accomplished without difficulty. The patient                            tolerated the procedure well. Scope In: Scope Out: Findings:      Esophagogastric landmarks were identified: the Z-line was found at 33       cm, the gastroesophageal junction was found at 33 cm and the upper       extent of the gastric folds was found at 43 cm from the incisors.      A large 10 cm hiatal hernia was present.      The exam of the esophagus was otherwise normal.      Erythematous mucosa was found in the hernia sac - no obvious ulcerations       or stigmata of bleeding but superficial erythema noted in multiple areas.      A single 5 mm  sessile polyp with erythema and adherent heme was found in       the distal gastric body. The polyp was removed with a hot snare.       Resection and retrieval were complete. To prevent bleeding after the       polypectomy, one hemostatic clip was successfully placed. Suspect this       is a benign fundic gland polyp. Numerous other benign appearing small       polyps were noted and not removed.      The exam of the stomach was otherwise normal.      The examined duodenum was normal. Impression:               - Esophagogastric landmarks identified.                           - 10 cm hiatal hernia with areas of erythematous                            mucosa in the hernia sac - no clear  ulcerations or                            stigmata of recent bleeding                           - A benign appearing gastric polyp with erythema                            and adherent heme. No high risk stigmata for                            bleding. Resected and retrieved. Clip was placed.                           - Other benign appearing fundic gland polyps                           - Normal stomach otherwise.                           - Normal examined duodenum.                           Possible the patient could be having intermittent                            bleeding / IDA in part from large hiatal hernia sac                            but no stigmata noted. I removed gastric polyp                            given some old heme there but no high risk stigmata                            for  bleeding.                           Recommend colonoscopy to clear her colon to ensure                            no other lower tract process, unclear if she is                            willing to do this however. I will discuss. If she                            wishes to proceed we can do this in the next 1-2                            days pending her tolerance of prep. If she declines                            then would monitor post EGD. Recommendation:           - Return patient to hospital ward for ongoing care.                           - Will discuss with her if she is willing to do                            colonoscopy or not                           - Diet pending decision about possible colonoscopy                           - Continue present medications.                           - Continue protonix.                           - Await pathology results.                           - Hold Eliquis for now while we determine next                            steps in her care                           - GI service will follow, call with questions Procedure Code(s):        ---  Professional ---                           [phone], Esophagogastroduodenoscopy, flexible,                            transoral; with removal of tumor(s), polyp(s), or  other lesion(s) by snare technique Diagnosis Code(s):        --- Professional ---                           K44.9, Diaphragmatic hernia without obstruction or                            gangrene                           K31.89, Other diseases of stomach and duodenum                           K31.7, Polyp of stomach and duodenum                           D50.9, Iron deficiency anemia, unspecified                           K92.1, Melena (includes Hematochezia) CPT copyright 2022 American [employer] Association. All rights reserved. The codes documented in this report are preliminary and upon coder review may  be revised to meet current compliance requirements. Viviann Spare P. Admiral Marcucci, MD 04/19/2023 3:02:49 PM This report has been signed electronically. Number of Addenda: 0

## 2023-04-19 NOTE — Anesthesia Procedure Notes (Signed)
Procedure Name: MAC Date/Time: 04/19/2023 2:36 PM  Performed by: Audie Pinto, CRNAPre-anesthesia Checklist: Patient identified, Emergency Drugs available, Suction available and Patient being monitored Patient Re-evaluated:Patient Re-evaluated prior to induction Oxygen Delivery Method: Nasal cannula Induction Type: IV induction Placement Confirmation: positive ETCO2 Dental Injury: Teeth and Oropharynx as per pre-operative assessment

## 2023-04-19 NOTE — Evaluation (Signed)
Occupational Therapy Evaluation Patient Details Name: Stacy Allen MRN: [id number] DOB: 09-14-1940 Today's Date: 04/19/2023   History of Present Illness Pt is a 82 y.o. female with medical history significant for coronary artery disease, hypertension, fibromyalgia, dyslipidemia who presents to the ER at the request of her orthopedic surgeon for a right femoral neck fracture, underwent R hip hemiarthroplasty.   Clinical Impression   Pt admitted for above, demonstrates L shoulder pain with ROM and some balance deficits. She ambulated to bathroom with CGA and needs CGA to setup assist for ADLs. She also demonstrates decreased activity tolerance, noted to be more out of breath following 52ft walk to bathroom. Pt would benefit from continued acute skilled OT services to address deficits and educate on energy conservation strategies prn to improve activity tolerance in ADLs. Anticipate no follow up OT needed as pt also declined HH services.        If plan is discharge home, recommend the following: Assistance with cooking/housework;Assist for transportation    Functional Status Assessment  Patient has had a recent decline in their functional status and demonstrates the ability to make significant improvements in function in a reasonable and predictable amount of time.  Equipment Recommendations  Tub/shower seat (home setup likely not able to fit tub bench)    Recommendations for Other Services       Precautions / Restrictions Precautions Precautions: Fall Restrictions Weight Bearing Restrictions: No      Mobility Bed Mobility Overal bed mobility: Needs Assistance Bed Mobility: Supine to Sit, Sit to Supine     Supine to sit: Supervision Sit to supine: Supervision        Transfers Overall transfer level: Needs assistance Equipment used: Rolling walker (2 wheels) Transfers: Sit to/from Stand Sit to Stand: Contact guard assist           General transfer comment: CGA  from EOB and from toilet      Balance Overall balance assessment: Needs assistance Sitting-balance support: Feet supported, No upper extremity supported Sitting balance-Leahy Scale: Good       Standing balance-Leahy Scale: Fair Standing balance comment: can traverse short distance without DME support                           ADL either performed or assessed with clinical judgement   ADL Overall ADL's : Needs assistance/impaired Eating/Feeding: Independent;Sitting   Grooming: Wash/dry hands;Standing;Contact guard assist   Upper Body Bathing: Sitting;Modified independent   Lower Body Bathing: Sitting/lateral leans;Set up;Supervison/ safety Lower Body Bathing Details (indicate cue type and reason): figure four technique simulated Upper Body Dressing : Sitting;Set up   Lower Body Dressing: Supervision/safety;Set up;Sitting/lateral leans Lower Body Dressing Details (indicate cue type and reason): doff/don socks using figure four technique Toilet Transfer: Contact guard assist;Rolling walker (2 wheels)   Toileting- Clothing Manipulation and Hygiene: Contact guard assist;Sit to/from stand       Functional mobility during ADLs: Contact guard assist;Rolling walker (2 wheels) (room level ambulation)       Vision Baseline Vision/History: 6 Macular Degeneration Vision Assessment?: Vision impaired- to be further tested in functional context Additional Comments: Pt reports she has MD at baseline, hx of blurry vision.     Perception         Praxis         Pertinent Vitals/Pain Pain Assessment Pain Assessment: Faces Faces Pain Scale: Hurts even more Pain Location: Pt reports the shoulders (L more recent), back, and abdomen  Pain Descriptors / Indicators: Discomfort, Grimacing, Guarding, Aching Pain Intervention(s): Monitored during session, Repositioned, Limited activity within patient's tolerance     Extremity/Trunk Assessment Upper Extremity Assessment Upper  Extremity Assessment: LUE deficits/detail (RUE WLF) LUE Deficits / Details: elbow flexion MMT 4/5, deferred shoulder mmt due to injury awaiting MRI results. Shoulder flexion of about 40* before onset of pain LUE: Shoulder pain with ROM   Lower Extremity Assessment Lower Extremity Assessment: Defer to PT evaluation       Communication Communication Communication: No apparent difficulties Cueing Techniques: Verbal cues   Cognition Arousal: Alert Behavior During Therapy: WFL for tasks assessed/performed Overall Cognitive Status: Within Functional Limits for tasks assessed                                       General Comments  Sp02 95% on RA following mobility, increase in breathing rate noted    Exercises     Shoulder Instructions      Home Living Family/patient expects to be discharged to:: Private residence Living Arrangements: Alone (lives in camper in [place] backyard) Available Help at Discharge: Family;Available 24 hours/day (someone is always home per report) Type of Home: Other(Comment) (camper) Home Access: Ramped entrance (bilat rails)     Home Layout: Other (Comment) (camper)     Bathroom Shower/Tub: Chief Strategy Officer:  (comfort height) Bathroom Accessibility: No   Home Equipment: Rollator (4 wheels);Cane - single point;BSC/3in1;Hand held shower head   Additional Comments: Vikki Ports is pt niece, she lives in the niece's backyard      Prior Functioning/Environment Prior Level of Function : Independent/Modified Independent;Driving;History of Falls (last six months)             Mobility Comments: Uses Rollator outside of camper ADLs Comments: ind        OT Problem List: Impaired UE functional use;Impaired balance (sitting and/or standing);Pain;Decreased activity tolerance      OT Treatment/Interventions: Self-care/ADL training;Therapeutic exercise;Therapeutic activities;Patient/family education;Balance training     OT Goals(Current goals can be found in the care plan section) Acute Rehab OT Goals Patient Stated Goal: To go home OT Goal Formulation: With patient/family Time For Goal Achievement: 05/03/23 Potential to Achieve Goals: Good ADL Goals Pt Will Perform Grooming: with modified independence;standing Pt Will Transfer to Toilet: with modified independence;ambulating Pt Will Perform Tub/Shower Transfer: Tub transfer;with modified independence Additional ADL Goal #1: Pt will demonstrate independent use of energy conservation strategies prn to engage in functional activities Additional ADL Goal #2: Pt will complete 3 step household management iADL with Supervision  OT Frequency: Min 1X/week    Co-evaluation              AM-PAC OT "6 Clicks" Daily Activity     Outcome Measure Help from another person eating meals?: None Help from another person taking care of personal grooming?: A Little Help from another person toileting, which includes using toliet, bedpan, or urinal?: A Little Help from another person bathing (including washing, rinsing, drying)?: A Little Help from another person to put on and taking off regular upper body clothing?: A Little Help from another person to put on and taking off regular lower body clothing?: A Little 6 Click Score: 19   End of Session Equipment Utilized During Treatment: Gait belt;Rolling walker (2 wheels) Nurse Communication: Mobility status  Activity Tolerance: Patient tolerated treatment well Patient left: in bed;with call bell/phone within reach;with family/visitor  present  OT Visit Diagnosis: Unsteadiness on feet (R26.81);History of falling (Z91.81);Pain Pain - Right/Left: Left Pain - part of body: Shoulder                Time: 1478-2956 OT Time Calculation (min): 24 min Charges:  OT General Charges $OT Visit: 1 Visit OT Evaluation $OT Eval Moderate Complexity: 1 Mod OT Treatments $Self Care/Home Management : 8-22 mins  04/19/2023  AB,  OTR/L  Acute Rehabilitation Services  Office: [phone]   Tristan Schroeder 04/19/2023, 5:28 PM

## 2023-04-19 NOTE — Progress Notes (Signed)
Echocardiogram 2D Echocardiogram has been performed.  Reinaldo Raddle Mata Rowen 04/19/2023, 4:51 PM

## 2023-04-19 NOTE — Plan of Care (Signed)

## 2023-04-20 DIAGNOSIS — Z86718 Personal history of other venous thrombosis and embolism: Secondary | ICD-10-CM | POA: Diagnosis not present

## 2023-04-20 DIAGNOSIS — D649 Anemia, unspecified: Secondary | ICD-10-CM | POA: Diagnosis not present

## 2023-04-20 DIAGNOSIS — Z7901 Long term (current) use of anticoagulants: Secondary | ICD-10-CM | POA: Diagnosis not present

## 2023-04-20 DIAGNOSIS — K922 Gastrointestinal hemorrhage, unspecified: Secondary | ICD-10-CM | POA: Diagnosis not present

## 2023-04-20 DIAGNOSIS — I10 Essential (primary) hypertension: Secondary | ICD-10-CM | POA: Diagnosis not present

## 2023-04-20 LAB — CBC
HCT: 28.4 % — ABNORMAL LOW (ref 36.0–46.0)
Hemoglobin: 8.6 g/dL — ABNORMAL LOW (ref 12.0–15.0)
MCH: 25.1 pg — ABNORMAL LOW (ref 26.0–34.0)
MCHC: 30.3 g/dL (ref 30.0–36.0)
MCV: 83 fL (ref 80.0–100.0)
Platelets: 266 10*3/uL (ref 150–400)
RBC: 3.42 MIL/uL — ABNORMAL LOW (ref 3.87–5.11)
RDW: 17.2 % — ABNORMAL HIGH (ref 11.5–15.5)
WBC: 7 10*3/uL (ref 4.0–10.5)
nRBC: 0 % (ref 0.0–0.2)

## 2023-04-20 LAB — T4, FREE: Free T4: 1.85 ng/dL — ABNORMAL HIGH (ref 0.61–1.12)

## 2023-04-20 LAB — SURGICAL PATHOLOGY

## 2023-04-20 MED ORDER — PEG-KCL-NACL-NASULF-NA ASC-C 100 G PO SOLR: 1.0000 | Freq: Once | ORAL | Status: DC

## 2023-04-20 MED ORDER — LEVOTHYROXINE SODIUM 25 MCG PO TABS: 125.0000 ug | ORAL_TABLET | Freq: Every day | ORAL | Status: DC

## 2023-04-20 MED ORDER — PEG-KCL-NACL-NASULF-NA ASC-C 100 G PO SOLR
0.5000 | Freq: Once | ORAL | Status: AC
  Administered 2023-04-20: 100 g via ORAL
  Filled 2023-04-20: qty 1

## 2023-04-20 NOTE — Evaluation (Signed)
Physical Therapy Evaluation and Discharge Patient Details Name: Stacy Allen MRN: [id number] DOB: 03/20/1941 Today's Date: 04/20/2023  History of Present Illness  82 y.o. female presented to ED 04/17/23 after fall from bed and struck her head. Reports 3 week history of near syncope. CT imaging negative for traumatic injuries.+anemia with GI bleed, bil pleural effusions,  10/23 upper endoscopy  PMH significant for previous hysterectomy, previous cholecystectomy, previous rectocele repair, CAD, COPD , previous DVT on Eliquis, hypertension, hyperlipidemia, CHF, osteoporosis, and fibromyalgia  Clinical Impression   Patient evaluated by Physical Therapy with no further acute PT needs identified. She is modified independent with rolling walker, ambulating in hallway. PT is signing off. Thank you for this referral.         If plan is discharge home, recommend the following:     Can travel by private vehicle        Equipment Recommendations None recommended by PT  Recommendations for Other Services       Functional Status Assessment Patient has not had a recent decline in their functional status     Precautions / Restrictions Precautions Precautions: Fall Restrictions Weight Bearing Restrictions: No      Mobility  Bed Mobility                    Transfers Overall transfer level: Modified independent Equipment used: Rolling walker (2 wheels) Transfers: Sit to/from Stand Sit to Stand: Modified independent (Device/Increase time)           General transfer comment: from recliner    Ambulation/Gait Ambulation/Gait assistance: Modified independent (Device/Increase time) Gait Distance (Feet): 120 Feet Assistive device: Rolling walker (2 wheels) Gait Pattern/deviations: Step-through pattern, Decreased stride length   Gait velocity interpretation: 1.31 - 2.62 ft/sec, indicative of limited community ambulator   General Gait Details: toward end of walk reported  mild dyspnea  Stairs            Wheelchair Mobility     Tilt Bed    Modified Rankin (Stroke Patients Only)       Balance Overall balance assessment: Needs assistance Sitting-balance support: Feet supported, No upper extremity supported Sitting balance-Leahy Scale: Good       Standing balance-Leahy Scale: Fair                               Pertinent Vitals/Pain Pain Assessment Pain Assessment: Faces Faces Pain Scale: Hurts little more Pain Location: Pt reports the shoulders (L more recent), back, and abdomen Pain Descriptors / Indicators: Discomfort, Grimacing, Guarding, Aching Pain Intervention(s): Limited activity within patient's tolerance, Monitored during session    Home Living Family/patient expects to be discharged to:: Private residence Living Arrangements: Alone (lives in camper in [place] backyard) Available Help at Discharge: Family;Available 24 hours/day (someone is always home per report) Type of Home: Other(Comment) (camper) Home Access: Ramped entrance (bilat rails)       Home Layout: Other (Comment) (camper) Home Equipment: Rollator (4 wheels);Cane - single point;BSC/3in1;Hand held shower head Additional Comments: Vikki Ports is pt niece, she lives in the niece's backyard    Prior Function Prior Level of Function : Independent/Modified Independent;Driving;History of Falls (last six months)             Mobility Comments: Production designer, theatre/television/film outside of camper ADLs Comments: ind     Extremity/Trunk Assessment   Upper Extremity Assessment Upper Extremity Assessment: Defer to OT evaluation    Lower Extremity Assessment  Lower Extremity Assessment: Generalized weakness    Cervical / Trunk Assessment Cervical / Trunk Assessment: Other exceptions Cervical / Trunk Exceptions: overweight  Communication   Communication Communication: No apparent difficulties  Cognition Arousal: Alert Behavior During Therapy: WFL for tasks  assessed/performed Overall Cognitive Status: Within Functional Limits for tasks assessed                                          General Comments      Exercises     Assessment/Plan    PT Assessment Patient does not need any further PT services  PT Problem List         PT Treatment Interventions      PT Goals (Current goals can be found in the Care Plan section)  Acute Rehab PT Goals Patient Stated Goal: go home tomorrow after colonoscopy PT Goal Formulation: All assessment and education complete, DC therapy    Frequency       Co-evaluation               AM-PAC PT "6 Clicks" Mobility  Outcome Measure Help needed turning from your back to your side while in a flat bed without using bedrails?: None Help needed moving from lying on your back to sitting on the side of a flat bed without using bedrails?: None Help needed moving to and from a bed to a chair (including a wheelchair)?: None Help needed standing up from a chair using your arms (e.g., wheelchair or bedside chair)?: None Help needed to walk in hospital room?: None Help needed climbing 3-5 steps with a railing? : A Little 6 Click Score: 23    End of Session Equipment Utilized During Treatment: Gait belt Activity Tolerance: Patient tolerated treatment well Patient left: in chair;with call bell/phone within reach   PT Visit Diagnosis: Other abnormalities of gait and mobility (R26.89)    Time: 7829-5621 PT Time Calculation (min) (ACUTE ONLY): 18 min   Charges:   PT Evaluation $PT Eval Low Complexity: 1 Low   PT General Charges $$ ACUTE PT VISIT: 1 Visit          Jerolyn Center, PT Acute Rehabilitation Services  Office [phone]   Zena Amos 04/20/2023, 12:57 PM

## 2023-04-20 NOTE — Addendum Note (Signed)
Addendum  created 04/20/23 0940 by Mariann Barter, MD   Attestation recorded in Intraprocedure, Flowsheet accepted, Intraprocedure Attestations filed

## 2023-04-20 NOTE — H&P (View-Only) (Signed)
Progress Note   Subjective  Chief Complaint: Melena and anemia  Today, patient is found sitting up either sink washing herself.  Tells me that she is on board for plans of colonoscopy tomorrow.  Denies any new GI complaints or concerns, no further dark stools overnight.   Objective   Vital signs in last 24 hours: Temp:  [97 F (36.1 C)-98.1 F (36.7 C)] 98.1 F (36.7 C) (10/24 0823) Pulse Rate:  [63-71] 69 (10/23 1942) Resp:  [15-24] 24 (10/24 0823) BP: (110-175)/(55-85) 157/85 (10/24 0823) SpO2:  [95 %-96 %] 96 % (10/24 0500) Last BM Date : 04/19/23 General:    Overweight white female in NAD Heart:  Regular rate and rhythm; no murmurs Lungs: Respirations even and unlabored, lungs CTA bilaterally Abdomen:  Soft, nontender and nondistended. Normal bowel sounds. Psych:  Cooperative. Normal mood and affect.  Intake/Output from previous day: 10/23 0701 - 10/24 0700 In: 200 [I.V.:200] Out: -    Lab Results: Recent Labs    04/18/23 0322 04/18/23 1628 04/19/23 0322 04/20/23 0608  WBC 7.0  --  6.2 7.0  HGB 7.9* 7.8* 8.0* 8.6*  HCT 26.1* 26.3* 26.8* 28.4*  PLT 271  --  236 266   BMET Recent Labs    04/17/23 1733 04/18/23 0322 04/19/23 0322  NA 136 135 134*  K 3.8 3.7 3.6  CL 101 100 99  CO2 26 27 24   GLUCOSE 97 103* 92  BUN 18 12 9   CREATININE 0.81 0.72 0.61  CALCIUM 8.3* 8.3* 8.3*   LFT Recent Labs    04/19/23 0322  PROT 5.9*  ALBUMIN 3.1*  AST 10*  ALT 12  ALKPHOS 52  BILITOT 1.8*   PT/INR Recent Labs    04/18/23 1739 04/19/23 0322  LABPROT 15.5* 14.4  INR 1.2 1.1    Studies/Results: DG Shoulder Left  Result Date: 04/19/2023 CLINICAL DATA:  Fall EXAM: LEFT SHOULDER - 2+ VIEW COMPARISON:  Left shoulder x-ray 12/11/2022 FINDINGS: There is subacromial joint space narrowing. There is no fracture or dislocation. Bones are osteopenic. The soft tissues are within normal limits. IMPRESSION: 1. No fracture or dislocation. 2. Subacromial joint  space narrowing can be seen with chronic rotator cuff tendinopathy. Electronically Signed   By: Darliss Cheney M.D.   On: 04/19/2023 17:16   ECHOCARDIOGRAM COMPLETE  Result Date: 04/19/2023    ECHOCARDIOGRAM REPORT   Patient Name:   Stacy Allen Date of Exam: 04/19/2023 Medical Rec #:  [id number]      Height:       63.0 in Accession #:    [id number]     Weight:       238.1 lb Date of Birth:  [birth year]     BSA:          2.082 m Patient Age:    82 years       BP:           130/82 mmHg Patient Gender: F              HR:           63 bpm. Exam Location:  Inpatient Procedure: 2D Echo, Cardiac Doppler, Color Doppler and Intracardiac            Opacification Agent Indications:    Other abnomalities of the heart  History:        Patient has no prior history of Echocardiogram examinations.  Risk Factors:Hypertension.  Sonographer:    Karma Ganja Referring Phys: [phone] A CALDWELL POWELL JR IMPRESSIONS  1. Left ventricular ejection fraction, by estimation, is 55 to 60%. The left ventricle has normal function. The left ventricle has no regional wall motion abnormalities. Left ventricular diastolic parameters are consistent with Grade II diastolic dysfunction (pseudonormalization).  2. Right ventricular systolic function is normal. The right ventricular size is normal. Tricuspid regurgitation signal is inadequate for assessing PA pressure.  3. Left atrial size was moderately dilated.  4. Right atrial size was severely dilated.  5. The mitral valve is normal in structure. No evidence of mitral valve regurgitation. No evidence of mitral stenosis.  6. The aortic valve was not well visualized. Aortic valve regurgitation is not visualized. No aortic stenosis is present.  7. The inferior vena cava is dilated in size with >50% respiratory variability, suggesting right atrial pressure of 8 mmHg. FINDINGS  Left Ventricle: Left ventricular ejection fraction, by estimation, is 55 to 60%. The left ventricle has normal  function. The left ventricle has no regional wall motion abnormalities. Definity contrast agent was given IV to delineate the left ventricular  endocardial borders. The left ventricular internal cavity size was normal in size. There is no left ventricular hypertrophy. Left ventricular diastolic parameters are consistent with Grade II diastolic dysfunction (pseudonormalization). Right Ventricle: The right ventricular size is normal. No increase in right ventricular wall thickness. Right ventricular systolic function is normal. Tricuspid regurgitation signal is inadequate for assessing PA pressure. Left Atrium: Left atrial size was moderately dilated. Right Atrium: Right atrial size was severely dilated. Pericardium: Trivial pericardial effusion is present. Mitral Valve: The mitral valve is normal in structure. No evidence of mitral valve regurgitation. No evidence of mitral valve stenosis. Tricuspid Valve: The tricuspid valve is normal in structure. Tricuspid valve regurgitation is trivial. Aortic Valve: The aortic valve was not well visualized. Aortic valve regurgitation is not visualized. No aortic stenosis is present. Aortic valve mean gradient measures 3.0 mmHg. Aortic valve peak gradient measures 7.6 mmHg. Aortic valve area, by VTI measures 2.51 cm. Pulmonic Valve: The pulmonic valve was normal in structure. Pulmonic valve regurgitation is not visualized. Aorta: The aortic root is normal in size and structure. Venous: The inferior vena cava is dilated in size with greater than 50% respiratory variability, suggesting right atrial pressure of 8 mmHg. IAS/Shunts: No atrial level shunt detected by color flow Doppler.  LEFT VENTRICLE PLAX 2D LVIDd:         5.10 cm      Diastology LVIDs:         3.80 cm      LV e' medial:    14.50 cm/s LV PW:         0.90 cm      LV E/e' medial:  9.2 LV IVS:        0.80 cm      LV e' lateral:   12.10 cm/s LVOT diam:     2.00 cm      LV E/e' lateral: 11.1 LV SV:         67 LV SV  Index:   32 LVOT Area:     3.14 cm  LV Volumes (MOD) LV vol d, MOD A2C: 104.0 ml LV vol d, MOD A4C: 64.3 ml LV vol s, MOD A2C: 12.7 ml LV vol s, MOD A4C: 16.9 ml LV SV MOD A2C:     91.3 ml LV SV MOD A4C:     64.3 ml  LV SV MOD BP:      70.2 ml RIGHT VENTRICLE             IVC RV Basal diam:  3.90 cm     IVC diam: 2.30 cm RV S prime:     17.40 cm/s TAPSE (M-mode): 3.0 cm LEFT ATRIUM              Index        RIGHT ATRIUM           Index LA diam:        4.30 cm  2.06 cm/m   RA Area:     29.00 cm LA Vol (A2C):   140.0 ml 67.23 ml/m  RA Volume:   94.50 ml  45.38 ml/m LA Vol (A4C):   91.4 ml  43.89 ml/m LA Biplane Vol: 114.0 ml 54.75 ml/m  AORTIC VALVE                    PULMONIC VALVE AV Area (Vmax):    1.92 cm     PV Vmax:       0.92 m/s AV Area (Vmean):   2.02 cm     PV Vmean:      61.500 cm/s AV Area (VTI):     2.51 cm     PV VTI:        0.209 m AV Vmax:           138.00 cm/s  PV Peak grad:  3.4 mmHg AV Vmean:          82.000 cm/s  PV Mean grad:  2.0 mmHg AV VTI:            0.265 m AV Peak Grad:      7.6 mmHg AV Mean Grad:      3.0 mmHg LVOT Vmax:         84.20 cm/s LVOT Vmean:        52.700 cm/s LVOT VTI:          0.212 m LVOT/AV VTI ratio: 0.80  AORTA Ao Root diam: 2.80 cm Ao Asc diam:  3.00 cm MITRAL VALVE MV Area (PHT): 3.77 cm     SHUNTS MV Decel Time: 201 msec     Systemic VTI:  0.21 m MV E velocity: 134.00 cm/s  Systemic Diam: 2.00 cm MV A velocity: 78.40 cm/s MV E/A ratio:  1.71 Dalton McleanMD Electronically signed by Wilfred Lacy Signature Date/Time: 04/19/2023/4:54:08 PM    Final      Assessment / Plan:   Assessment: 1.  Acute on chronic normocytic anemia secondary to suspected lower GI bleed: Dark tarry stools for 3 to 4 weeks on OAC for DVT, stopped 10/21 at 10:00, EGD 04/19/2023 with 10 cm hiatal hernia with areas of erythematous mucosa in the hernia sac, benign-appearing gastric polyp and other benign-appearing fundic gland polyps-thought that anemia could be due to large hiatal hernia,  colonoscopy planned for tomorrow 2.  Chronic diarrhea with gas, bloating abdominal discomfort 3.  History of DVT 4.  COPD 5.  CHF  Plan: 1.  Colonoscopy scheduled for tomorrow with Dr. Adela Lank.  Did discuss risks, benefits, limitations and alternatives with the patient and she agrees to proceed. 2.  Patient will have movi prep today started this afternoon in split dose fashion 3.  Continue clear liquid diet and n.p.o. at midnight 4.  Continue to monitor hemoglobin and transfusion as needed less than 7  Thank you for your kind consultation,  we will continue to follow.   LOS: 2 days   Unk Lightning  04/20/2023, 10:23 AM

## 2023-04-20 NOTE — Anesthesia Postprocedure Evaluation (Signed)
Anesthesia Post Note  Patient: URSULINE ROTON  Procedure(s) Performed: ESOPHAGOGASTRODUODENOSCOPY (EGD) WITH PROPOFOL POLYPECTOMY HEMOSTASIS CLIP PLACEMENT     Patient location during evaluation: PACU Anesthesia Type: MAC Level of consciousness: awake and alert Pain management: pain level controlled Vital Signs Assessment: post-procedure vital signs reviewed and stable Respiratory status: spontaneous breathing, nonlabored ventilation, respiratory function stable and patient connected to nasal cannula oxygen Cardiovascular status: stable and blood pressure returned to baseline Postop Assessment: no apparent nausea or vomiting Anesthetic complications: no   No notable events documented.             Mariann Barter

## 2023-04-20 NOTE — Progress Notes (Signed)
PROGRESS NOTE        PATIENT DETAILS Name: KD BORUCH Age: 82 y.o. Sex: female Date of Birth: Oct 11, 1940 Admit Date: 04/17/2023 Admitting Physician Anselm Jungling, DO [id number], Edythe Lynn, NP  Brief Summary: Patient is a 82 y.o.  female with history of CAD s/p PCI, recurrent DVT on Eliquis-who presented with a fall-in the setting of melanotic stools and a hemoglobin of 5.5.  She was then admitted to [facility] below for further details.  Significant events: 10/21>> admit to [facility]  Significant studies: 10/21>> x-ray right humerus: No fracture 10/21>> CT chest/abdomen/pelvis: No acute injury. 10/21>> CT head: No acute intracranial injury 10/21>> CT maxillofacial: No fracture 10/21>> CT C-spine: No fracture 10/23>> echo: EF 55-60%, grade 2 diastolic dysfunction.  Significant microbiology data: 10/21>> urine culture:<10,000 colonies/mL  Procedures: 10/23>> EGD: Hiatal hernia, benign-appearing gastric polyp-no obvious bleeding source.  Consults: GI  Subjective: Brown-colored stools overnight.  Reluctantly agreeable to pursue colonoscopy.  Objective: Vitals: Blood pressure (!) 157/85, pulse 69, temperature 98.1 F (36.7 C), temperature source Oral, resp. rate (!) 24, height 5\' 3"  (1.6 m), weight 108 kg, SpO2 96%.   Exam: Gen Exam:Alert awake-not in any distress HEENT:atraumatic, normocephalic Chest: B/L clear to auscultation anteriorly CVS:S1S2 regular Abdomen:soft non tender, non distended Extremities:no edema Neurology: Non focal Skin: no rash  Pertinent Labs/Radiology:    Latest Ref Rng & Units 04/20/2023    6:08 AM 04/19/2023    3:22 AM 04/18/2023    4:28 PM  CBC  WBC 4.0 - 10.5 K/uL 7.0  6.2    Hemoglobin 12.0 - 15.0 g/dL 8.6  8.0  7.8   Hematocrit 36.0 - 46.0 % 28.4  26.8  26.3   Platelets 150 - 400 K/uL 266  236      Lab Results  Component Value Date   NA 134 (L) 04/19/2023   K 3.6 04/19/2023   CL 99 04/19/2023   CO2 24  04/19/2023      Assessment/Plan: Upper GI bleeding with acute blood loss anemia Hb stable-did require units of PRBC on admission Brown stools overnight EGD negative for obvious bleeding source Reluctantly agreeable to pursue colonoscopy-now scheduled for tomorrow PPI Eliquis remains on hold.   Follow CBC  Mechanical fall Apparently rolled out of bed-no history of syncope Extensive imaging studies negative Complaining of left shoulder pain-x-ray ordered 10/23 PT/OT eval  History of VTE Eliquis on hold  History of CAD s/p PCI 2007 Recent LHC July 2024 with nonobstructive CAD-patent RCA stent. No general symptoms currently  Chronic HFpEF Euvolemic  HTN BP stable Amlodipine/metoprolol  Hypothyroidism TSH suppressed-Free T4 elevated-Synthroid reduced to 125 mg Repeat thyroid function panel at PCPs office in 4 to 5 weeks.  Mood disorder Stable Celexa/Klonopin  Morbid Obesity: Estimated body mass index is 42.18 kg/m as calculated from the following:   Height as of this encounter: 5\' 3"  (1.6 m).   Weight as of this encounter: 108 kg.   Code status:   Code Status: Full Code   DVT Prophylaxis: SCDs Start: 04/18/23 0110   Family Communication: Niece-[name] [name]-[phone]-updated 10/24   Disposition Plan: Status is: Inpatient Remains inpatient appropriate because: Severity of illness   Planned Discharge Destination:Home health   Diet: Diet Order             Diet NPO time specified Except for: Ice Chips, Sips with Meds  Diet effective midnight           Diet clear liquid Fluid consistency: Thin  Diet effective now                     Antimicrobial agents: Anti-infectives (From admission, onward)    None        MEDICATIONS: Scheduled Meds:  amLODipine  5 mg Oral Daily   citalopram  40 mg Oral Daily   clonazePAM  1 mg Oral QHS   gabapentin  600 mg Oral q morning   And   gabapentin  1,200 mg Oral QHS   levothyroxine  150 mcg Oral  Daily   metoprolol tartrate  25 mg Oral Daily   pantoprazole (PROTONIX) IV  40 mg Intravenous Q12H   peg 3350 powder  0.5 kit Oral Once   And   peg 3350 powder  0.5 kit Oral Once   Continuous Infusions: PRN Meds:.acetaminophen, hydrALAZINE   I have personally reviewed following labs and imaging studies  LABORATORY DATA: CBC: Recent Labs  Lab 04/17/23 1733 04/18/23 0322 04/18/23 1628 04/19/23 0322 04/20/23 0608  WBC 8.8 7.0  --  6.2 7.0  NEUTROABS 6.0  --   --   --   --   HGB 5.5* 7.9* 7.8* 8.0* 8.6*  HCT 19.6* 26.1* 26.3* 26.8* 28.4*  MCV 83.4 83.4  --  83.0 83.0  PLT 332 271  --  236 266    Basic Metabolic Panel: Recent Labs  Lab 04/17/23 1733 04/18/23 0322 04/19/23 0322  NA 136 135 134*  K 3.8 3.7 3.6  CL 101 100 99  CO2 26 27 24   GLUCOSE 97 103* 92  BUN 18 12 9   CREATININE 0.81 0.72 0.61  CALCIUM 8.3* 8.3* 8.3*  MG  --  2.1  --     GFR: Estimated Creatinine Clearance: 66.1 mL/min (by C-G formula based on SCr of 0.61 mg/dL).  Liver Function Tests: Recent Labs  Lab 04/17/23 1733 04/19/23 0322  AST 12* 10*  ALT 13 12  ALKPHOS 55 52  BILITOT 0.7 1.8*  PROT 6.0* 5.9*  ALBUMIN 3.1* 3.1*   Recent Labs  Lab 04/17/23 1733  LIPASE 23   No results for input(s): "AMMONIA" in the last 168 hours.  Coagulation Profile: Recent Labs  Lab 04/18/23 1739 04/19/23 0322  INR 1.2 1.1    Cardiac Enzymes: No results for input(s): "CKTOTAL", "CKMB", "CKMBINDEX", "TROPONINI" in the last 168 hours.  BNP (last 3 results) No results for input(s): "PROBNP" in the last 8760 hours.  Lipid Profile: No results for input(s): "CHOL", "HDL", "LDLCALC", "TRIG", "CHOLHDL", "LDLDIRECT" in the last 72 hours.  Thyroid Function Tests: Recent Labs    04/17/23 1733 04/20/23 0608  TSH 0.222*  --   FREET4  --  1.85*    Anemia Panel: Recent Labs    04/19/23 0322  FERRITIN 10*  TIBC 447  IRON 25*    Urine analysis:    Component Value Date/Time   COLORURINE  STRAW (A) 04/17/2023 1808   APPEARANCEUR CLEAR 04/17/2023 1808   APPEARANCEUR CLEAR 07/23/2014 1444   LABSPEC 1.010 04/17/2023 1808   LABSPEC 1.005 07/23/2014 1444   PHURINE 7.0 04/17/2023 1808   GLUCOSEU NEGATIVE 04/17/2023 1808   GLUCOSEU NEGATIVE 07/23/2014 1444   HGBUR NEGATIVE 04/17/2023 1808   BILIRUBINUR NEGATIVE 04/17/2023 1808   BILIRUBINUR NEGATIVE 07/23/2014 1444   KETONESUR NEGATIVE 04/17/2023 1808   PROTEINUR NEGATIVE 04/17/2023 1808   NITRITE NEGATIVE 04/17/2023 1808  LEUKOCYTESUR NEGATIVE 04/17/2023 1808   LEUKOCYTESUR NEGATIVE 07/23/2014 1444    Sepsis Labs: Lactic Acid, Venous    Component Value Date/Time   LATICACIDVEN 0.9 04/17/2023 1733    MICROBIOLOGY: Recent Results (from the past 240 hour(s))  Urine Culture     Status: Abnormal   Collection Time: 04/17/23  7:45 PM   Specimen: Urine, Clean Catch  Result Value Ref Range Status   Specimen Description URINE, CLEAN CATCH  Final   Special Requests NONE  Final   Culture (A)  Final    <10,000 COLONIES/mL INSIGNIFICANT GROWTH Performed at [facility], [address] [place], [place] [address]    Report Status 04/18/2023 FINAL  Final    RADIOLOGY STUDIES/RESULTS: DG Shoulder Left  Result Date: 04/19/2023 CLINICAL DATA:  Fall EXAM: LEFT SHOULDER - 2+ VIEW COMPARISON:  Left shoulder x-ray 12/11/2022 FINDINGS: There is subacromial joint space narrowing. There is no fracture or dislocation. Bones are osteopenic. The soft tissues are within normal limits. IMPRESSION: 1. No fracture or dislocation. 2. 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 Electronically Signed   By: Darliss Cheney M.D.   On: 04/19/2023 17:16   ECHOCARDIOGRAM COMPLETE  Result Date: 04/19/2023    ECHOCARDIOGRAM REPORT   Patient Name:   NYJAH CHOUDHURY Date of Exam: 04/19/2023 Medical Rec #:  [id number]      Height:       63.0 in Accession #:    [id number]     Weight:       238.1 lb Date of  Birth:  November 28, 1940     BSA:          2.082 m Patient Age:    81 years       BP:           130/82 mmHg Patient Gender: F              HR:           63 bpm. Exam Location:  Inpatient Procedure: 2D Echo, Cardiac Doppler, Color Doppler and Intracardiac            Opacification Agent Indications:    Other abnomalities of the heart  History:        Patient has no prior history of Echocardiogram examinations.                 Risk Factors:Hypertension.  Sonographer:    Karma Ganja Referring Phys: [id number] A CALDWELL POWELL JR IMPRESSIONS  1. Left ventricular ejection fraction, by estimation, is 55 to 60%. The left ventricle has normal function. The left ventricle has no regional wall motion abnormalities. Left ventricular diastolic parameters are consistent with Grade II diastolic dysfunction (pseudonormalization).  2. Right ventricular systolic function is normal. The right ventricular size is normal. Tricuspid regurgitation signal is inadequate for assessing PA pressure.  3. Left atrial size was moderately dilated.  4. Right atrial size was severely dilated.  5. The mitral valve is normal in structure. No evidence of mitral valve regurgitation. No evidence of mitral stenosis.  6. The aortic valve was not well visualized. Aortic valve regurgitation is not visualized. No aortic stenosis is present.  7. The inferior vena cava is dilated in size with >50% respiratory variability, suggesting right atrial pressure of 8 mmHg. FINDINGS  Left Ventricle: Left ventricular ejection fraction, by estimation, is 55 to 60%. The left ventricle has normal function. The left ventricle has no regional wall motion abnormalities. Definity  contrast agent was given IV to delineate the left ventricular  endocardial borders. The left ventricular internal cavity size was normal in size. There is no left ventricular hypertrophy. Left ventricular diastolic parameters are consistent with Grade II diastolic dysfunction (pseudonormalization). Right  Ventricle: The right ventricular size is normal. No increase in right ventricular wall thickness. Right ventricular systolic function is normal. Tricuspid regurgitation signal is inadequate for assessing PA pressure. Left Atrium: Left atrial size was moderately dilated. Right Atrium: Right atrial size was severely dilated. Pericardium: Trivial pericardial effusion is present. Mitral Valve: The mitral valve is normal in structure. No evidence of mitral valve regurgitation. No evidence of mitral valve stenosis. Tricuspid Valve: The tricuspid valve is normal in structure. Tricuspid valve regurgitation is trivial. Aortic Valve: The aortic valve was not well visualized. Aortic valve regurgitation is not visualized. No aortic stenosis is present. Aortic valve mean gradient measures 3.0 mmHg. Aortic valve peak gradient measures 7.6 mmHg. Aortic valve area, by VTI measures 2.51 cm. Pulmonic Valve: The pulmonic valve was normal in structure. Pulmonic valve regurgitation is not visualized. Aorta: The aortic root is normal in size and structure. Venous: The inferior vena cava is dilated in size with greater than 50% respiratory variability, suggesting right atrial pressure of 8 mmHg. IAS/Shunts: No atrial level shunt detected by color flow Doppler.  LEFT VENTRICLE PLAX 2D LVIDd:         5.10 cm      Diastology LVIDs:         3.80 cm      LV e' medial:    14.50 cm/s LV PW:         0.90 cm      LV E/e' medial:  9.2 LV IVS:        0.80 cm      LV e' lateral:   12.10 cm/s LVOT diam:     2.00 cm      LV E/e' lateral: 11.1 LV SV:         67 LV SV Index:   32 LVOT Area:     3.14 cm  LV Volumes (MOD) LV vol d, MOD A2C: 104.0 ml LV vol d, MOD A4C: 64.3 ml LV vol s, MOD A2C: 12.7 ml LV vol s, MOD A4C: 16.9 ml LV SV MOD A2C:     91.3 ml LV SV MOD A4C:     64.3 ml LV SV MOD BP:      70.2 ml RIGHT VENTRICLE             IVC RV Basal diam:  3.90 cm     IVC diam: 2.30 cm RV S prime:     17.40 cm/s TAPSE (M-mode): 3.0 cm LEFT ATRIUM               Index        RIGHT ATRIUM           Index LA diam:        4.30 cm  2.06 cm/m   RA Area:     29.00 cm LA Vol (A2C):   140.0 ml 67.23 ml/m  RA Volume:   94.50 ml  45.38 ml/m LA Vol (A4C):   91.4 ml  43.89 ml/m LA Biplane Vol: 114.0 ml 54.75 ml/m  AORTIC VALVE                    PULMONIC VALVE AV Area (Vmax):    1.92 cm     PV  Vmax:       0.92 m/s AV Area (Vmean):   2.02 cm     PV Vmean:      61.500 cm/s AV Area (VTI):     2.51 cm     PV VTI:        0.209 m AV Vmax:           138.00 cm/s  PV Peak grad:  3.4 mmHg AV Vmean:          82.000 cm/s  PV Mean grad:  2.0 mmHg AV VTI:            0.265 m AV Peak Grad:      7.6 mmHg AV Mean Grad:      3.0 mmHg LVOT Vmax:         84.20 cm/s LVOT Vmean:        52.700 cm/s LVOT VTI:          0.212 m LVOT/AV VTI ratio: 0.80  AORTA Ao Root diam: 2.80 cm Ao Asc diam:  3.00 cm MITRAL VALVE MV Area (PHT): 3.77 cm     SHUNTS MV Decel Time: 201 msec     Systemic VTI:  0.21 m MV E velocity: 134.00 cm/s  Systemic Diam: 2.00 cm MV A velocity: 78.40 cm/s MV E/A ratio:  1.71 Dalton McleanMD Electronically signed by Wilfred Lacy Signature Date/Time: 04/19/2023/4:54:08 PM    Final      LOS: 2 days   Jeoffrey Massed, MD  [facility]    To contact the attending provider between 7A-7P or the covering provider during after hours 7P-7A, please log into the web site [email or web address] and access using universal [place] password for that web site. If you do not have the password, please call the hospital operator.  04/20/2023, 11:29 AM

## 2023-04-20 NOTE — Anesthesia Preprocedure Evaluation (Signed)
Anesthesia Evaluation  Patient identified by MRN, date of birth, ID band Patient awake    Reviewed: Allergy & Precautions, NPO status , [name] Chart, lab work & pertinent test results, reviewed documented beta blocker date and time   History of Anesthesia Complications (+) history of anesthetic complications  Airway Mallampati: III  TM Distance: >3 FB     Dental no notable dental hx.    Pulmonary shortness of breath, sleep apnea , former smoker   breath sounds clear to auscultation       Cardiovascular hypertension, + angina  + CAD and +CHF  (-) Past MI and (-) Cardiac Stents  Rhythm:Regular Rate:Normal     Neuro/Psych  Headaches PSYCHIATRIC DISORDERS Anxiety Depression     Neuromuscular disease    GI/Hepatic hiatal hernia, PUD,GERD  ,,  Endo/Other  Hypothyroidism    Renal/GU      Musculoskeletal  (+) Arthritis ,  Fibromyalgia -  Abdominal   Peds  Hematology  (+) Blood dyscrasia, anemia   Anesthesia Other Findings   Reproductive/Obstetrics                              Anesthesia Physical Anesthesia Plan  ASA: 3  Anesthesia Plan: MAC   Post-op Pain Management:    Induction:   PONV Risk Score and Plan: 2 and Propofol infusion  Airway Management Planned:   Additional Equipment:   Intra-op Plan:   Post-operative Plan:   Informed Consent: I have reviewed the patients History and Physical, chart, labs and discussed the procedure including the risks, benefits and alternatives for the proposed anesthesia with the patient or authorized representative who has indicated his/her understanding and acceptance.     Dental advisory given  Plan Discussed with: CRNA  Anesthesia Plan Comments:          Anesthesia Quick Evaluation

## 2023-04-20 NOTE — Progress Notes (Signed)
Mobility Specialist Progress Note;   04/20/23 1500  Mobility  Activity Ambulated with assistance in hallway  Level of Assistance Standby assist, set-up cues, supervision of patient - no hands on  Assistive Device Front wheel walker  Distance Ambulated (ft) 175 ft  Activity Response Tolerated well  Mobility Referral Yes  $Mobility charge 1 Mobility  Mobility Specialist Start Time (ACUTE ONLY) 1500  Mobility Specialist Stop Time (ACUTE ONLY) 1520  Mobility Specialist Time Calculation (min) (ACUTE ONLY) 20 min   Pt agreeable to mobility. Required no physical assistance during ambulation, SV. Asx throughout session. Pt back in bed with all needs met.   Caesar Bookman Mobility Specialist Please contact via SecureChat or Rehab Office [phone]

## 2023-04-20 NOTE — Progress Notes (Signed)
Progress Note   Subjective  Chief Complaint: Melena and anemia  Today, patient is found sitting up either sink washing herself.  Tells me that she is on board for plans of colonoscopy tomorrow.  Denies any new GI complaints or concerns, no further dark stools overnight.   Objective   Vital signs in last 24 hours: Temp:  [97 F (36.1 C)-98.1 F (36.7 C)] 98.1 F (36.7 C) (10/24 0823) Pulse Rate:  [63-71] 69 (10/23 1942) Resp:  [15-24] 24 (10/24 0823) BP: (110-175)/(55-85) 157/85 (10/24 0823) SpO2:  [95 %-96 %] 96 % (10/24 0500) Last BM Date : 04/19/23 General:    Overweight white female in NAD Heart:  Regular rate and rhythm; no murmurs Lungs: Respirations even and unlabored, lungs CTA bilaterally Abdomen:  Soft, nontender and nondistended. Normal bowel sounds. Psych:  Cooperative. Normal mood and affect.  Intake/Output from previous day: 10/23 0701 - 10/24 0700 In: 200 [I.V.:200] Out: -    Lab Results: Recent Labs    04/18/23 0322 04/18/23 1628 04/19/23 0322 04/20/23 0608  WBC 7.0  --  6.2 7.0  HGB 7.9* 7.8* 8.0* 8.6*  HCT 26.1* 26.3* 26.8* 28.4*  PLT 271  --  236 266   BMET Recent Labs    04/17/23 1733 04/18/23 0322 04/19/23 0322  NA 136 135 134*  K 3.8 3.7 3.6  CL 101 100 99  CO2 26 27 24   GLUCOSE 97 103* 92  BUN 18 12 9   CREATININE 0.81 0.72 0.61  CALCIUM 8.3* 8.3* 8.3*   LFT Recent Labs    04/19/23 0322  PROT 5.9*  ALBUMIN 3.1*  AST 10*  ALT 12  ALKPHOS 52  BILITOT 1.8*   PT/INR Recent Labs    04/18/23 1739 04/19/23 0322  LABPROT 15.5* 14.4  INR 1.2 1.1    Studies/Results: DG Shoulder Left  Result Date: 04/19/2023 CLINICAL DATA:  Fall EXAM: LEFT SHOULDER - 2+ VIEW COMPARISON:  Left shoulder x-ray 12/11/2022 FINDINGS: There is subacromial joint space narrowing. There is no fracture or dislocation. Bones are osteopenic. The soft tissues are within normal limits. IMPRESSION: 1. No fracture or dislocation. 2. Subacromial joint  space narrowing can be seen with chronic rotator cuff tendinopathy. Electronically Signed   By: Darliss Cheney M.D.   On: 04/19/2023 17:16   ECHOCARDIOGRAM COMPLETE  Result Date: 04/19/2023    ECHOCARDIOGRAM REPORT   Patient Name:   Stacy Allen Date of Exam: 04/19/2023 Medical Rec #:  [id number]      Height:       63.0 in Accession #:    [id number]     Weight:       238.1 lb Date of Birth:  [birth year]     BSA:          2.082 m Patient Age:    82 years       BP:           130/82 mmHg Patient Gender: F              HR:           63 bpm. Exam Location:  Inpatient Procedure: 2D Echo, Cardiac Doppler, Color Doppler and Intracardiac            Opacification Agent Indications:    Other abnomalities of the heart  History:        Patient has no prior history of Echocardiogram examinations.  Risk Factors:Hypertension.  Sonographer:    Karma Ganja Referring Phys: [phone] A CALDWELL POWELL JR IMPRESSIONS  1. Left ventricular ejection fraction, by estimation, is 55 to 60%. The left ventricle has normal function. The left ventricle has no regional wall motion abnormalities. Left ventricular diastolic parameters are consistent with Grade II diastolic dysfunction (pseudonormalization).  2. Right ventricular systolic function is normal. The right ventricular size is normal. Tricuspid regurgitation signal is inadequate for assessing PA pressure.  3. Left atrial size was moderately dilated.  4. Right atrial size was severely dilated.  5. The mitral valve is normal in structure. No evidence of mitral valve regurgitation. No evidence of mitral stenosis.  6. The aortic valve was not well visualized. Aortic valve regurgitation is not visualized. No aortic stenosis is present.  7. The inferior vena cava is dilated in size with >50% respiratory variability, suggesting right atrial pressure of 8 mmHg. FINDINGS  Left Ventricle: Left ventricular ejection fraction, by estimation, is 55 to 60%. The left ventricle has normal  function. The left ventricle has no regional wall motion abnormalities. Definity contrast agent was given IV to delineate the left ventricular  endocardial borders. The left ventricular internal cavity size was normal in size. There is no left ventricular hypertrophy. Left ventricular diastolic parameters are consistent with Grade II diastolic dysfunction (pseudonormalization). Right Ventricle: The right ventricular size is normal. No increase in right ventricular wall thickness. Right ventricular systolic function is normal. Tricuspid regurgitation signal is inadequate for assessing PA pressure. Left Atrium: Left atrial size was moderately dilated. Right Atrium: Right atrial size was severely dilated. Pericardium: Trivial pericardial effusion is present. Mitral Valve: The mitral valve is normal in structure. No evidence of mitral valve regurgitation. No evidence of mitral valve stenosis. Tricuspid Valve: The tricuspid valve is normal in structure. Tricuspid valve regurgitation is trivial. Aortic Valve: The aortic valve was not well visualized. Aortic valve regurgitation is not visualized. No aortic stenosis is present. Aortic valve mean gradient measures 3.0 mmHg. Aortic valve peak gradient measures 7.6 mmHg. Aortic valve area, by VTI measures 2.51 cm. Pulmonic Valve: The pulmonic valve was normal in structure. Pulmonic valve regurgitation is not visualized. Aorta: The aortic root is normal in size and structure. Venous: The inferior vena cava is dilated in size with greater than 50% respiratory variability, suggesting right atrial pressure of 8 mmHg. IAS/Shunts: No atrial level shunt detected by color flow Doppler.  LEFT VENTRICLE PLAX 2D LVIDd:         5.10 cm      Diastology LVIDs:         3.80 cm      LV e' medial:    14.50 cm/s LV PW:         0.90 cm      LV E/e' medial:  9.2 LV IVS:        0.80 cm      LV e' lateral:   12.10 cm/s LVOT diam:     2.00 cm      LV E/e' lateral: 11.1 LV SV:         67 LV SV  Index:   32 LVOT Area:     3.14 cm  LV Volumes (MOD) LV vol d, MOD A2C: 104.0 ml LV vol d, MOD A4C: 64.3 ml LV vol s, MOD A2C: 12.7 ml LV vol s, MOD A4C: 16.9 ml LV SV MOD A2C:     91.3 ml LV SV MOD A4C:     64.3 ml  LV SV MOD BP:      70.2 ml RIGHT VENTRICLE             IVC RV Basal diam:  3.90 cm     IVC diam: 2.30 cm RV S prime:     17.40 cm/s TAPSE (M-mode): 3.0 cm LEFT ATRIUM              Index        RIGHT ATRIUM           Index LA diam:        4.30 cm  2.06 cm/m   RA Area:     29.00 cm LA Vol (A2C):   140.0 ml 67.23 ml/m  RA Volume:   94.50 ml  45.38 ml/m LA Vol (A4C):   91.4 ml  43.89 ml/m LA Biplane Vol: 114.0 ml 54.75 ml/m  AORTIC VALVE                    PULMONIC VALVE AV Area (Vmax):    1.92 cm     PV Vmax:       0.92 m/s AV Area (Vmean):   2.02 cm     PV Vmean:      61.500 cm/s AV Area (VTI):     2.51 cm     PV VTI:        0.209 m AV Vmax:           138.00 cm/s  PV Peak grad:  3.4 mmHg AV Vmean:          82.000 cm/s  PV Mean grad:  2.0 mmHg AV VTI:            0.265 m AV Peak Grad:      7.6 mmHg AV Mean Grad:      3.0 mmHg LVOT Vmax:         84.20 cm/s LVOT Vmean:        52.700 cm/s LVOT VTI:          0.212 m LVOT/AV VTI ratio: 0.80  AORTA Ao Root diam: 2.80 cm Ao Asc diam:  3.00 cm MITRAL VALVE MV Area (PHT): 3.77 cm     SHUNTS MV Decel Time: 201 msec     Systemic VTI:  0.21 m MV E velocity: 134.00 cm/s  Systemic Diam: 2.00 cm MV A velocity: 78.40 cm/s MV E/A ratio:  1.71 Dalton McleanMD Electronically signed by Wilfred Lacy Signature Date/Time: 04/19/2023/4:54:08 PM    Final      Assessment / Plan:   Assessment: 1.  Acute on chronic normocytic anemia secondary to suspected lower GI bleed: Dark tarry stools for 3 to 4 weeks on OAC for DVT, stopped 10/21 at 10:00, EGD 04/19/2023 with 10 cm hiatal hernia with areas of erythematous mucosa in the hernia sac, benign-appearing gastric polyp and other benign-appearing fundic gland polyps-thought that anemia could be due to large hiatal hernia,  colonoscopy planned for tomorrow 2.  Chronic diarrhea with gas, bloating abdominal discomfort 3.  History of DVT 4.  COPD 5.  CHF  Plan: 1.  Colonoscopy scheduled for tomorrow with Dr. Adela Lank.  Did discuss risks, benefits, limitations and alternatives with the patient and she agrees to proceed. 2.  Patient will have movi prep today started this afternoon in split dose fashion 3.  Continue clear liquid diet and n.p.o. at midnight 4.  Continue to monitor hemoglobin and transfusion as needed less than 7  Thank you for your kind consultation,  we will continue to follow.   LOS: 2 days   Unk Lightning  04/20/2023, 10:23 AM

## 2023-04-21 ENCOUNTER — Other Ambulatory Visit (HOSPITAL_COMMUNITY): Payer: Self-pay

## 2023-04-21 ENCOUNTER — Encounter (HOSPITAL_COMMUNITY)
Admission: EM | Disposition: A | Discharge status: Home Health | Payer: Self-pay | Source: Home / Self Care | Attending: Emergency Medicine

## 2023-04-21 ENCOUNTER — Inpatient Hospital Stay (HOSPITAL_COMMUNITY): Payer: Medicare HMO | Admitting: Certified Registered"

## 2023-04-21 DIAGNOSIS — D126 Benign neoplasm of colon, unspecified: Secondary | ICD-10-CM

## 2023-04-21 DIAGNOSIS — D649 Anemia, unspecified: Secondary | ICD-10-CM

## 2023-04-21 DIAGNOSIS — I10 Essential (primary) hypertension: Secondary | ICD-10-CM | POA: Diagnosis not present

## 2023-04-21 DIAGNOSIS — K921 Melena: Secondary | ICD-10-CM

## 2023-04-21 DIAGNOSIS — K922 Gastrointestinal hemorrhage, unspecified: Secondary | ICD-10-CM | POA: Diagnosis not present

## 2023-04-21 DIAGNOSIS — Z86718 Personal history of other venous thrombosis and embolism: Secondary | ICD-10-CM | POA: Diagnosis not present

## 2023-04-21 DIAGNOSIS — E039 Hypothyroidism, unspecified: Secondary | ICD-10-CM

## 2023-04-21 HISTORY — PX: POLYPECTOMY: SHX5525

## 2023-04-21 HISTORY — PX: COLONOSCOPY WITH PROPOFOL: SHX5780

## 2023-04-21 HISTORY — DX: Benign neoplasm of colon, unspecified: D12.6

## 2023-04-21 SURGERY — COLONOSCOPY WITH PROPOFOL
Anesthesia: Monitor Anesthesia Care

## 2023-04-21 MED ORDER — PROPOFOL 10 MG/ML IV BOLUS
INTRAVENOUS | Status: DC | PRN
  Administered 2023-04-21 (×2): 20 mg via INTRAVENOUS
  Administered 2023-04-21: 30 mg via INTRAVENOUS
  Administered 2023-04-21: 80 mg via INTRAVENOUS

## 2023-04-21 MED ORDER — PROPOFOL 500 MG/50ML IV EMUL
INTRAVENOUS | Status: DC | PRN
  Administered 2023-04-21: 100 ug/kg/min via INTRAVENOUS

## 2023-04-21 MED ORDER — SUCRALFATE 1 GM/10ML PO SUSP
1.0000 g | Freq: Three times a day (TID) | ORAL | 0 refills | Status: DC
  Filled 2023-04-21: qty 280, 7d supply, fill #0

## 2023-04-21 MED ORDER — OMEPRAZOLE 40 MG PO CPDR
40.0000 mg | DELAYED_RELEASE_CAPSULE | Freq: Two times a day (BID) | ORAL | 1 refills | Status: DC
  Filled 2023-04-21: qty 60, 30d supply, fill #0

## 2023-04-21 MED ORDER — SUCRALFATE 1 GM/10ML PO SUSP
1.0000 g | Freq: Three times a day (TID) | ORAL | Status: DC
  Administered 2023-04-21: 1 g via ORAL
  Filled 2023-04-21 (×2): qty 10

## 2023-04-21 MED ORDER — APIXABAN 5 MG PO TABS: 5.0000 mg | ORAL_TABLET | Freq: Two times a day (BID) | ORAL | Status: DC

## 2023-04-21 MED ORDER — SODIUM CHLORIDE 0.9 % IV SOLN
INTRAVENOUS | Status: AC | PRN
  Administered 2023-04-21: 500 mL via INTRAVENOUS

## 2023-04-21 MED ORDER — LEVOTHYROXINE SODIUM 125 MCG PO TABS
125.0000 ug | ORAL_TABLET | Freq: Every day | ORAL | 1 refills | Status: DC
  Filled 2023-04-21: qty 30, 30d supply, fill #0
  Filled 2023-05-29: qty 30, 30d supply, fill #1

## 2023-04-21 MED ORDER — LIDOCAINE 2% (20 MG/ML) 5 ML SYRINGE
INTRAMUSCULAR | Status: DC | PRN
  Administered 2023-04-21: 60 mg via INTRAVENOUS

## 2023-04-21 SURGICAL SUPPLY — 19 items
ELECT REM PT RETURN 9FT ADLT (ELECTROSURGICAL)
FCP BXJMBJMB 240X2.8X (CUTTING FORCEPS)
FLOOR PAD 36X40 (MISCELLANEOUS) ×2
FORCEPS BIOP RAD 4 LRG CAP 4 (CUTTING FORCEPS)
FORCEPS BIOP RJ4 240 W/NDL (CUTTING FORCEPS)
INJECTOR/SNARE I SNARE (MISCELLANEOUS)
LUBRICANT JELLY 4.5OZ STERILE (MISCELLANEOUS)
MANIFOLD NEPTUNE II (INSTRUMENTS)
NDL SCLEROTHERAPY 25GX240 (NEEDLE) IMPLANT
NEEDLE SCLEROTHERAPY 25GX240 (NEEDLE)
PROBE APC STR FIRE (PROBE)
PROBE INJECTION GOLD (MISCELLANEOUS)
PROBE INJECTION GOLD 7FR (MISCELLANEOUS)
SNARE ROTATE MED OVAL 20MM (MISCELLANEOUS)
SYR 50ML LL SCALE MARK (SYRINGE)
TRAP SPECIMEN MUCOUS 40CC (MISCELLANEOUS)
TUBING ENDO SMARTCAP PENTAX (MISCELLANEOUS)
TUBING IRRIGATION ENDOGATOR (MISCELLANEOUS) ×2
WATER STERILE IRR 1000ML POUR (IV SOLUTION)

## 2023-04-21 NOTE — Anesthesia Preprocedure Evaluation (Signed)
Anesthesia Evaluation  Patient identified by MRN, date of birth, ID band Patient awake    Reviewed: Allergy & Precautions, NPO status , [name] Chart, lab work & pertinent test results, reviewed documented beta blocker date and time   History of Anesthesia Complications (+) history of anesthetic complications  Airway Mallampati: III  TM Distance: >3 FB     Dental no notable dental hx.    Pulmonary shortness of breath, sleep apnea , former smoker   breath sounds clear to auscultation       Cardiovascular hypertension, + angina  + CAD and +CHF  (-) Past MI and (-) Cardiac Stents  Rhythm:Regular Rate:Normal     Neuro/Psych  Headaches PSYCHIATRIC DISORDERS Anxiety Depression     Neuromuscular disease    GI/Hepatic hiatal hernia, PUD,GERD  ,,  Endo/Other  Hypothyroidism  Morbid obesity  Renal/GU      Musculoskeletal  (+) Arthritis ,  Fibromyalgia -  Abdominal  (+) + obese  Peds  Hematology  (+) Blood dyscrasia, anemia   Anesthesia Other Findings   Reproductive/Obstetrics                             Anesthesia Physical Anesthesia Plan  ASA: 3  Anesthesia Plan: MAC   Post-op Pain Management: Minimal or no pain anticipated   Induction:   PONV Risk Score and Plan: 2 and Propofol infusion  Airway Management Planned:   Additional Equipment:   Intra-op Plan:   Post-operative Plan:   Informed Consent: I have reviewed the patients History and Physical, chart, labs and discussed the procedure including the risks, benefits and alternatives for the proposed anesthesia with the patient or authorized representative who has indicated his/her understanding and acceptance.     Dental advisory given  Plan Discussed with: CRNA  Anesthesia Plan Comments:         Anesthesia Quick Evaluation

## 2023-04-21 NOTE — Anesthesia Postprocedure Evaluation (Signed)
Anesthesia Post Note  Patient: Stacy Allen  Procedure(s) Performed: COLONOSCOPY WITH PROPOFOL POLYPECTOMY     Patient location during evaluation: PACU Anesthesia Type: MAC Level of consciousness: awake and alert Pain management: pain level controlled Vital Signs Assessment: post-procedure vital signs reviewed and stable Respiratory status: spontaneous breathing, nonlabored ventilation and respiratory function stable Cardiovascular status: blood pressure returned to baseline and stable Postop Assessment: no apparent nausea or vomiting Anesthetic complications: no   No notable events documented.  Last Vitals:  Vitals:   04/21/23 0920 04/21/23 0930  BP: (!) 133/54 136/64  Pulse: 77 71  Resp: 20 18  Temp:    SpO2: 99% 98%    Last Pain:  Vitals:   04/21/23 0930  TempSrc:   PainSc: 2                  Lowella Curb

## 2023-04-21 NOTE — Care Management Important Message (Signed)
Important Message  Patient Details  Name: Stacy Allen MRN: [id number] Date of Birth: Sep 30, 1940   Important Message Given:  Yes - Medicare IM     Dorena Bodo 04/21/2023, 3:59 PM

## 2023-04-21 NOTE — Progress Notes (Signed)
Mobility Specialist Progress Note;   04/21/23 1050  Mobility  Activity Ambulated with assistance in hallway  Level of Assistance Standby assist, set-up cues, supervision of patient - no hands on  Assistive Device Front wheel walker  Distance Ambulated (ft) 175 ft  Activity Response Tolerated well  Mobility Referral Yes  $Mobility charge 1 Mobility  Mobility Specialist Start Time (ACUTE ONLY) 1050  Mobility Specialist Stop Time (ACUTE ONLY) 1110  Mobility Specialist Time Calculation (min) (ACUTE ONLY) 20 min   Pt agreeable to mobility with encouragement. Required no physical assistance during ambulation, SV. Asx throughout session. Pt back in bed with all needs met, eager for discharge today.   Caesar Bookman Mobility Specialist Please contact via SecureChat or Rehab Office [phone]

## 2023-04-21 NOTE — Transfer of Care (Signed)
Immediate Anesthesia Transfer of Care Note  Patient: Stacy Allen  Procedure(s) Performed: COLONOSCOPY WITH PROPOFOL POLYPECTOMY  Patient Location: Endoscopy Unit  Anesthesia Type:MAC  Level of Consciousness: awake, alert , and oriented  Airway & Oxygen Therapy: Patient Spontanous Breathing  Post-op Assessment: Report given to RN, Post -op Vital signs reviewed and stable, and Patient moving all extremities X 4  Post vital signs: Reviewed and stable  Last Vitals:  Vitals Value Taken Time  BP 125/56   Temp    Pulse 78   Resp 14   SpO2 98     Last Pain:  Vitals:   04/21/23 0755  TempSrc: Tympanic  PainSc: 2          Complications: No notable events documented.

## 2023-04-21 NOTE — Plan of Care (Signed)
Pt medically stable for d/c, SL to [facility] d/c, family at bedside to transport pt home, all belongings sent with pt.

## 2023-04-21 NOTE — TOC Transition Note (Signed)
Transition of Care [facility]) - CM/SW Discharge Note   Patient Details  Name: Stacy Allen MRN: [id number] Date of Birth: 1940/12/16  Transition of Care [facility] CM/SW Contact:  Gordy Clement, RN Phone Number: 04/21/2023, 11:04 AM   Clinical Narrative:     Patient to DC to home today wioth Niece and Nephew.  Patient states she has all DME needed and is declining the recommended Shower Chair- states she will get on her own.  Home Health PT and OT will be provided by [facility] . AVS updated  No Additional TOC needs           Patient Goals and CMS Choice      Discharge Placement                         Discharge Plan and Services Additional resources added to the After Visit Summary for                                       Social Determinants of Health (SDOH) Interventions SDOH Screenings   Food Insecurity: No Food Insecurity (04/18/2023)  Housing: Low Risk  (04/18/2023)  Transportation Needs: No Transportation Needs (04/18/2023)  Utilities: Not At Risk (04/18/2023)  Financial Resource Strain: Low Risk  (01/08/2020)   Received from [facility]  Tobacco Use: Medium Risk (04/19/2023)     Readmission Risk Interventions     No data to display

## 2023-04-21 NOTE — Progress Notes (Signed)
OT DC Note  Patient Details Name: Stacy Allen MRN: [id number] DOB: 08-Apr-1941   Cancelled Treatment:    Reason Eval/Treat Not Completed: Other (comment) (Pt now completing mobility without physical assist and does not require much rest break. Observed as she completed LBD without assist. Pt back to baseline functioning per report and per chart review. OT signing off)  04/21/2023  AB, OTR/L  Acute Rehabilitation Services  Office: [phone]   Tristan Schroeder 04/21/2023, 5:23 PM

## 2023-04-21 NOTE — Plan of Care (Signed)

## 2023-04-21 NOTE — Op Note (Signed)
[facility] Patient Name: Stacy Allen Procedure Date : 04/21/2023 MRN: [id number] Attending MD: Willaim Rayas. Adela Lank , MD, [id number] Date of Birth: 10/24/1940 CSN: [id number] Age: 82 Admit Type: Inpatient Procedure:                Colonoscopy Indications:              Symptomatic anemia, dark stools, large hiatal                            hernia with erythema on EGD, last colonoscopy a                            long time ago. Colonoscopy to clear lower tract.                            History of Eliquis use and recent traumatic fall Providers:                Viviann Spare P. Adela Lank, MD, Jacquelyn "Jaci" Clelia Croft,                            RN, Priscella Mann, Technician Referring MD:              Medicines:                Monitored Anesthesia Care Complications:            No immediate complications. Estimated blood loss:                            Minimal. Estimated Blood Loss:     Estimated blood loss was minimal. Procedure:                Pre-Anesthesia Assessment:                           - Prior to the procedure, a History and Physical                            was performed, and patient medications and                            allergies were reviewed. The patient's tolerance of                            previous anesthesia was also reviewed. The risks                            and benefits of the procedure and the sedation                            options and risks were discussed with the patient.                            All questions were answered, and informed consent  was obtained. Prior Anticoagulants: The patient has                            taken Eliquis (apixaban), last dose was 5 days                            prior to procedure. ASA Grade Assessment: III - A                            patient with severe systemic disease. After                            reviewing the risks and benefits, the patient was                             deemed in satisfactory condition to undergo the                            procedure.                           After obtaining informed consent, the colonoscope                            was passed under direct vision. Throughout the                            procedure, the patient's blood pressure, pulse, and                            oxygen saturations were monitored continuously. The                            CF-HQ190L ([id number]) Olympus colonoscope was                            introduced through the anus and advanced to the the                            cecum, identified by appendiceal orifice and                            ileocecal valve. The colonoscopy was performed                            without difficulty. The patient tolerated the                            procedure well. The quality of the bowel                            preparation was adequate. The ileocecal valve,  appendiceal orifice, and rectum were photographed. Scope In: 8:42:13 AM Scope Out: 9:11:58 AM Scope Withdrawal Time: 0 hours 22 minutes 59 seconds  Total Procedure Duration: 0 hours 29 minutes 45 seconds  Findings:      The perianal and digital rectal examinations were normal.      A 4 mm polyp was found in the cecum. The polyp was sessile. The polyp       was removed with a cold snare. Resection and retrieval were complete.      Two sessile polyps were found in the ascending colon. The polyps were       diminutive in size. These polyps were removed with a cold snare.       Resection and retrieval were complete.      The colon was long / redundant, tortuous, abdominal pressure required to       achieve cecal intubation.      Internal hemorrhoids were found during retroflexion.      The exam was otherwise without abnormality. Impression:               - One 4 mm polyp in the cecum, removed with a cold                            snare. Resected and retrieved.                            - Two diminutive polyps in the ascending colon,                            removed with a cold snare. Resected and retrieved.                           - Tortuous colon.                           - Internal hemorrhoids.                           - The examination was otherwise normal.                           No cause for anemia / bleeding on colonoscopy.                            Suspect anemia could be due to hiatal hernia which                            is quite large, also had inflamed gastric polyp                            that was removed.                           No bleeding since she was in the hospital. Will                            talk with primary team about plans for resumption  of anticoagulation - she is a fall risk. If she is                            not going to be anticoagulated in the future                            hopefully we can treat medically (BID PPI,                            carafate) and monitor blood counts. If she is going                            to be anticoagulated, at risk for recurrent /                            worsening anemia without repair of hiatal hernia                            (which would be a major surgery for her). Recommend                            twice daily 40mg  omeprazole or pantoprazole                            indefinitely for now and monitor. Can consider                            capsule endoscopy with recurrent anemia but I think                            low yield. Her CT scan did not show any mass                            lesions or concerning bowel problems. Recommendation:           - Return patient to hospital ward for ongoing care.                           - Advance diet as tolerated.                           - Continue present medications.                           - Await pathology results.                           - Will discuss anticoagulation  with her primary                            team. I don't think we need to pursue further  workup right now, but at risk for worsening /                            recurrent anemia with resumption of                            anticoagulation. If she does resume anticoagulation                            would not do it any sooner than tomorrow given                            polypectomy today                           - Twice daily 40mg  PPI indefinitely                           - May be able to be discharge later today. Needs                            repeat CBC next week to make sure Hgb stable. Can                            follow up with primary GI MD as outpatient (Dr.                            Norma Fredrickson)                           - We will sign off for now, call with questions Procedure Code(s):        --- Professional ---                           [phone], Colonoscopy, flexible; with removal of                            tumor(s), polyp(s), or other lesion(s) by snare                            technique Diagnosis Code(s):        --- Professional ---                           D12.0, Benign neoplasm of cecum                           D12.2, Benign neoplasm of ascending colon                           K64.8, Other hemorrhoids                           K92.1, Melena (includes Hematochezia)  Q43.8, Other specified congenital malformations of                            intestine CPT copyright 2022 [employer]. All rights reserved. The codes documented in this report are preliminary and upon coder review may  be revised to meet current compliance requirements. Viviann Spare P. Yazmeen Woolf, MD 04/21/2023 9:30:02 AM This report has been signed electronically. Number of Addenda: 0

## 2023-04-21 NOTE — Discharge Summary (Addendum)
PATIENT DETAILS Name: Stacy Allen Age: 82 y.o. Sex: female Date of Birth: 12/15/1940 MRN: [id number]. Admitting Physician: Anselm Jungling, DO WUJ:[id number], Edythe Lynn, NP  Admit Date: 04/17/2023 Discharge date: 04/21/2023  Recommendations for Outpatient Follow-up:  Follow up with PCP in 1-2 weeks Please obtain CMP/CBC in one week Colonoscopy polypectomy-biopsy pending-please follow-up.  Admitted From:  Home  Disposition: Home health   Discharge Condition: good  CODE STATUS:   Code Status: Full Code   Diet recommendation:  Diet Order             Diet Heart Fluid consistency: Thin  Diet effective now           Diet - low sodium heart healthy                    Brief Summary: Patient is a 82 y.o.  female with history of CAD s/p PCI, recurrent DVT on Eliquis-who presented with a fall-in the setting of melanotic stools and a hemoglobin of 5.5.  She was then admitted to [facility] below for further details.   Significant events: 10/21>> admit to [facility]   Significant studies: 10/21>> x-ray right humerus: No fracture 10/21>> CT chest/abdomen/pelvis: No acute injury. 10/21>> CT head: No acute intracranial injury 10/21>> CT maxillofacial: No fracture 10/21>> CT C-spine: No fracture 10/23>> echo: EF 55-60%, grade 2 diastolic dysfunction.   Significant microbiology data: 10/21>> urine culture:<10,000 colonies/mL   Procedures: 10/23>> EGD: Hiatal hernia, benign-appearing gastric polyp-no obvious bleeding source. 10/25>> colonoscopy: Cecal polyp-internal hemorrhoids-no foci of bleeding.   Consults: GI  Brief Hospital Course: Upper GI bleeding with acute blood loss anemia Hb stable-be required 2 units of PRBC on admission Brown stools overnight EGD/colonoscopy negative for bleeding foci Extensive risk/benefits discussion done with patient-and then with patient's niece over the phone-plan is to cautiously resume Eliquis-this is her first episode of GI bleeding. GI  recommending Carafate x 1 week, will change PPI to twice daily on discharge If patient is GI bleeding reoccurs in the future-we can consider stopping anticoagulation.   Mechanical fall Apparently rolled out of bed-no history of syncope Extensive imaging studies negative Complaining of left shoulder pain-x-ray ordered 10/23 PT/OT eval-Home health recommended Spoke with niece over the phone on 10/25-they have ordered some devices that they would put on the edge of the bed so that she does not roll over in the future.   History of VTE-recurrent Eliquis resume on 10/26 or 10/27-as patient required a polypectomy today See discussion regarding risk/benefits of anticoagulation-above.   History of CAD s/p PCI 2007 Recent LHC July 2024 with nonobstructive CAD-patent RCA stent. No angina symptoms.   Chronic HFpEF Euvolemic   HTN BP stable Amlodipine/metoprolol   Hypothyroidism TSH suppressed-Free T4 elevated-Synthroid reduced to 125 mg Repeat thyroid function panel at PCPs office in 4 to 5 weeks.   Mood disorder Stable Celexa/Klonopin   Morbid Obesity: Estimated body mass index is 42.18 kg/m as calculated from the following:   Height as of this encounter: 5\' 3"  (1.6 m).   Weight as of this encounter: 108 kg.    Discharge Diagnoses:  Principal Problem:   Acute GI bleeding Active Problems:   Essential hypertension   Hypothyroidism   History of DVT (deep vein thrombosis)   Anxiety   Obesity, Class III, BMI 40-49.9 (morbid obesity) (HCC)   Symptomatic anemia   Anticoagulated   Melena   Gastric polyp   Benign neoplasm of colon   Discharge Instructions:  Activity:  As  tolerated with Full fall precautions use walker/cane & assistance as needed  Discharge Instructions     Call MD for:   Complete by: As directed    Black tarry stools or hematochezia or coffee-ground emesis or blood in vomitus.   Call MD for:  extreme fatigue   Complete by: As directed    Call MD for:   persistant dizziness or light-headedness   Complete by: As directed    Diet - low sodium heart healthy   Complete by: As directed    Discharge instructions   Complete by: As directed    Follow with Primary MD  Jodi Marble, NP in 1-2 weeks  Please get a complete blood count and chemistry panel checked by your Primary MD at your next visit, and again as instructed by your Primary MD.  Colonoscopy biopsies pending-please follow results with your gastroenterologist or your primary care practitioner  Your thyroid function tests were somewhat abnormal-dose of Synthroid has been decreased slightly to 125 mcg daily-please ask your primary care practitioner to repeat thyroid function test in next 4-5 weeks.  Take Carafate x 1 week  Change omeprazole to twice daily dosing  If you have recurrent GI bleeding in the future-please have a risk/benefit discussion of whether we need to stop anticoagulation permanently.  Get Medicines reviewed and adjusted: Please take all your medications with you for your next visit with your Primary MD  Laboratory/radiological data: Please request your Primary MD to go over all hospital tests and procedure/radiological results at the follow up, please ask your Primary MD to get all Hospital records sent to his/her office.  In some cases, they will be blood work, cultures and biopsy results pending at the time of your discharge. Please request that your primary care M.D. follows up on these results.  Also Note the following: If you experience worsening of your admission symptoms, develop shortness of breath, life threatening emergency, suicidal or homicidal thoughts you must seek medical attention immediately by calling 911 or calling your MD immediately  if symptoms less severe.  You must read complete instructions/literature along with all the possible adverse reactions/side effects for all the Medicines you take and that have been prescribed to you. Take any  new Medicines after you have completely understood and accpet all the possible adverse reactions/side effects.   Do not drive when taking Pain medications or sleeping medications (Benzodaizepines)  Do not take more than prescribed Pain, Sleep and Anxiety Medications. It is not advisable to combine anxiety,sleep and pain medications without talking with your primary care practitioner  Special Instructions: If you have smoked or chewed Tobacco  in the last 2 yrs please stop smoking, stop any regular Alcohol  and or any Recreational drug use.  Wear Seat belts while driving.  Please note: You were cared for by a hospitalist during your hospital stay. Once you are discharged, your primary care physician will handle any further medical issues. Please note that NO REFILLS for any discharge medications will be authorized once you are discharged, as it is imperative that you return to your primary care physician (or establish a relationship with a primary care physician if you do not have one) for your post hospital discharge needs so that they can reassess your need for medications and monitor your lab values.   Increase activity slowly   Complete by: As directed       Allergies as of 04/21/2023       Reactions   Azithromycin Diarrhea, Rash   "  fainting" Other reaction(s): Syncope  "fainting"    Other reaction(s): NOT KNOWN  "fainting"   Meloxicam Diarrhea   Morphine Other (See Comments), Rash   Other reaction(s): Unknown Retain urine Other reaction(s): NOT KNOWN, Other (See Comments) Retain urine Other reaction(s): side effect of severe rash   Adhesive [tape]    blisters   Codeine Nausea And Vomiting   Lactose Intolerance (gi) Other (See Comments)   "stomach pain"   Red Dye #40 (allura Red) Itching   "red food dye"   Shellfish-derived Products Itching, Swelling   Aspirin Rash   Latex Rash   With extended contact   Lodine [etodolac] Rash   Morphine And Codeine Hives, Nausea And  Vomiting   Other reaction(s): UNKNOWN   Penicillins Rash, Hives   Given on 05/07/20.  No reaction noted.   Pravastatin Rash        Medication List     STOP taking these medications    amoxicillin 500 MG capsule Commonly known as: AMOXIL       TAKE these medications    acetaminophen 500 MG tablet Commonly known as: TYLENOL Take 500 mg by mouth every 6 (six) hours as needed.   ADVAIR HFA IN Inhale 1 puff into the lungs daily as needed.   albuterol 108 (90 Base) MCG/ACT inhaler Commonly known as: VENTOLIN HFA Inhale 2 puffs into the lungs.   amLODipine 10 MG tablet Commonly known as: NORVASC Take 0.5 tablets (5 mg total) by mouth daily.   apixaban 5 MG Tabs tablet Commonly known as: Eliquis Take 1 tablet (5 mg total) by mouth 2 (two) times daily. Then 1 tablet (5mg ) twice daily Start taking on: April 23, 2023 What changed:  additional instructions These instructions start on April 23, 2023. If you are unsure what to do until then, ask your doctor or other care provider.   carboxymethylcellulose 0.5 % Soln Commonly known as: REFRESH PLUS Place 1 drop into both eyes daily as needed (dry eyes).   citalopram 40 MG tablet Commonly known as: CELEXA Take 40 mg by mouth daily.   clonazePAM 1 MG tablet Commonly known as: KLONOPIN Take 1 mg by mouth at bedtime.   EPINEPHrine 0.3 mg/0.3 mL Soaj injection Commonly known as: EPI-PEN Inject 0.3 mg into the muscle once.   FeroSul 325 (65 Fe) MG tablet Generic drug: ferrous sulfate Take 325 mg by mouth daily as needed (when tolerable).   gabapentin 600 MG tablet Commonly known as: NEURONTIN Take 600-1,200 mg by mouth See admin instructions. Take 1 tablet (600mg ) by mouth in the morning and 2 tablets (1200mg ) by mouth at bedtime   levothyroxine 125 MCG tablet Commonly known as: SYNTHROID Take 1 tablet (125 mcg total) by mouth daily. What changed:  medication strength how much to take   metoprolol tartrate  25 MG tablet Commonly known as: LOPRESSOR Take 1 tablet (25 mg total) by mouth daily.   omeprazole 20 MG capsule Commonly known as: PRILOSEC Take 2 capsules (40 mg total) by mouth 2 (two) times daily before a meal. What changed: when to take this   ondansetron 4 MG disintegrating tablet Commonly known as: ZOFRAN-ODT Take 1 tablet (4 mg total) by mouth every 8 (eight) hours as needed for nausea or vomiting.   sucralfate 1 GM/10ML suspension Commonly known as: CARAFATE Take 10 mLs (1 g total) by mouth 4 (four) times daily -  with meals and at bedtime for 7 days.   tiZANidine 4 MG tablet Commonly known as:  ZANAFLEX Take 4 mg by mouth at bedtime as needed for muscle spasms. Only uses 1/4 of medication        Allergies  Allergen Reactions   Azithromycin Diarrhea and Rash    "fainting"  Other reaction(s): Syncope  "fainting"    Other reaction(s): NOT KNOWN  "fainting"   Meloxicam Diarrhea   Morphine Other (See Comments) and Rash    Other reaction(s): Unknown Retain urine Other reaction(s): NOT KNOWN, Other (See Comments) Retain urine Other reaction(s): side effect of severe rash   Adhesive [Tape]     blisters   Codeine Nausea And Vomiting   Lactose Intolerance (Gi) Other (See Comments)    "stomach pain"   Red Dye #40 (Allura Red) Itching    "red food dye"   Shellfish-Derived Products Itching and Swelling   Aspirin Rash   Latex Rash    With extended contact   Lodine [Etodolac] Rash   Morphine And Codeine Hives and Nausea And Vomiting    Other reaction(s): UNKNOWN   Penicillins Rash and Hives    Given on 05/07/20.  No reaction noted.   Pravastatin Rash     Other Procedures/Studies: DG Shoulder Left  Result Date: 04/19/2023 CLINICAL DATA:  Fall EXAM: LEFT SHOULDER - 2+ VIEW COMPARISON:  Left shoulder x-ray 12/11/2022 FINDINGS: There is subacromial joint space narrowing. There is no fracture or dislocation. Bones are osteopenic. The soft tissues are within  normal limits. IMPRESSION: 1. No fracture or dislocation. 2. 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 Electronically Signed   By: Darliss Cheney M.D.   On: 04/19/2023 17:16   ECHOCARDIOGRAM COMPLETE  Result Date: 04/19/2023    ECHOCARDIOGRAM REPORT   Patient Name:   ADYN ASKINS Date of Exam: 04/19/2023 Medical Rec #:  [id number]      Height:       63.0 in Accession #:    [id number]     Weight:       238.1 lb Date of Birth:  06/16/1941     BSA:          2.082 m Patient Age:    81 years       BP:           130/82 mmHg Patient Gender: F              HR:           63 bpm. Exam Location:  Inpatient Procedure: 2D Echo, Cardiac Doppler, Color Doppler and Intracardiac            Opacification Agent Indications:    Other abnomalities of the heart  History:        Patient has no prior history of Echocardiogram examinations.                 Risk Factors:Hypertension.  Sonographer:    Karma Ganja Referring Phys: [id number] A CALDWELL POWELL JR IMPRESSIONS  1. Left ventricular ejection fraction, by estimation, is 55 to 60%. The left ventricle has normal function. The left ventricle has no regional wall motion abnormalities. Left ventricular diastolic parameters are consistent with Grade II diastolic dysfunction (pseudonormalization).  2. Right ventricular systolic function is normal. The right ventricular size is normal. Tricuspid regurgitation signal is inadequate for assessing PA pressure.  3. Left atrial size was moderately dilated.  4. Right atrial size was severely dilated.  5. The mitral valve is normal in structure. No evidence of mitral valve regurgitation. No evidence  of mitral stenosis.  6. The aortic valve was not well visualized. Aortic valve regurgitation is not visualized. No aortic stenosis is present.  7. The inferior vena cava is dilated in size with >50% respiratory variability, suggesting right atrial pressure of 8 mmHg. FINDINGS  Left Ventricle: Left ventricular  ejection fraction, by estimation, is 55 to 60%. The left ventricle has normal function. The left ventricle has no regional wall motion abnormalities. Definity contrast agent was given IV to delineate the left ventricular  endocardial borders. The left ventricular internal cavity size was normal in size. There is no left ventricular hypertrophy. Left ventricular diastolic parameters are consistent with Grade II diastolic dysfunction (pseudonormalization). Right Ventricle: The right ventricular size is normal. No increase in right ventricular wall thickness. Right ventricular systolic function is normal. Tricuspid regurgitation signal is inadequate for assessing PA pressure. Left Atrium: Left atrial size was moderately dilated. Right Atrium: Right atrial size was severely dilated. Pericardium: Trivial pericardial effusion is present. Mitral Valve: The mitral valve is normal in structure. No evidence of mitral valve regurgitation. No evidence of mitral valve stenosis. Tricuspid Valve: The tricuspid valve is normal in structure. Tricuspid valve regurgitation is trivial. Aortic Valve: The aortic valve was not well visualized. Aortic valve regurgitation is not visualized. No aortic stenosis is present. Aortic valve mean gradient measures 3.0 mmHg. Aortic valve peak gradient measures 7.6 mmHg. Aortic valve area, by VTI measures 2.51 cm. Pulmonic Valve: The pulmonic valve was normal in structure. Pulmonic valve regurgitation is not visualized. Aorta: The aortic root is normal in size and structure. Venous: The inferior vena cava is dilated in size with greater than 50% respiratory variability, suggesting right atrial pressure of 8 mmHg. IAS/Shunts: No atrial level shunt detected by color flow Doppler.  LEFT VENTRICLE PLAX 2D LVIDd:         5.10 cm      Diastology LVIDs:         3.80 cm      LV e' medial:    14.50 cm/s LV PW:         0.90 cm      LV E/e' medial:  9.2 LV IVS:        0.80 cm      LV e' lateral:   12.10 cm/s  LVOT diam:     2.00 cm      LV E/e' lateral: 11.1 LV SV:         67 LV SV Index:   32 LVOT Area:     3.14 cm  LV Volumes (MOD) LV vol d, MOD A2C: 104.0 ml LV vol d, MOD A4C: 64.3 ml LV vol s, MOD A2C: 12.7 ml LV vol s, MOD A4C: 16.9 ml LV SV MOD A2C:     91.3 ml LV SV MOD A4C:     64.3 ml LV SV MOD BP:      70.2 ml RIGHT VENTRICLE             IVC RV Basal diam:  3.90 cm     IVC diam: 2.30 cm RV S prime:     17.40 cm/s TAPSE (M-mode): 3.0 cm LEFT ATRIUM              Index        RIGHT ATRIUM           Index LA diam:        4.30 cm  2.06 cm/m   RA Area:     29.00  cm LA Vol (A2C):   140.0 ml 67.23 ml/m  RA Volume:   94.50 ml  45.38 ml/m LA Vol (A4C):   91.4 ml  43.89 ml/m LA Biplane Vol: 114.0 ml 54.75 ml/m  AORTIC VALVE                    PULMONIC VALVE AV Area (Vmax):    1.92 cm     PV Vmax:       0.92 m/s AV Area (Vmean):   2.02 cm     PV Vmean:      61.500 cm/s AV Area (VTI):     2.51 cm     PV VTI:        0.209 m AV Vmax:           138.00 cm/s  PV Peak grad:  3.4 mmHg AV Vmean:          82.000 cm/s  PV Mean grad:  2.0 mmHg AV VTI:            0.265 m AV Peak Grad:      7.6 mmHg AV Mean Grad:      3.0 mmHg LVOT Vmax:         84.20 cm/s LVOT Vmean:        52.700 cm/s LVOT VTI:          0.212 m LVOT/AV VTI ratio: 0.80  AORTA Ao Root diam: 2.80 cm Ao Asc diam:  3.00 cm MITRAL VALVE MV Area (PHT): 3.77 cm     SHUNTS MV Decel Time: 201 msec     Systemic VTI:  0.21 m MV E velocity: 134.00 cm/s  Systemic Diam: 2.00 cm MV A velocity: 78.40 cm/s MV E/A ratio:  1.71 Dalton McleanMD Electronically signed by Wilfred Lacy Signature Date/Time: 04/19/2023/4:54:08 PM    Final    CT CHEST ABDOMEN PELVIS W CONTRAST  Result Date: 04/17/2023 CLINICAL DATA:  Polytrauma, blunt Fall elevated on Eliquis this morning, near syncope for last 3 weeks. Left lower chest pain/left upper abdominal pain. Right facial injury. EXAM: CT CHEST, ABDOMEN, AND PELVIS WITH CONTRAST TECHNIQUE: Multidetector CT imaging of the chest,  abdomen and pelvis was performed following the standard protocol during bolus administration of intravenous contrast. RADIATION DOSE REDUCTION: This exam was performed according to the departmental dose-optimization program which includes automated exposure control, adjustment of the mA and/or kV according to patient size and/or use of iterative reconstruction technique. CONTRAST:  75mL OMNIPAQUE IOHEXOL 350 MG/ML SOLN COMPARISON:  None Available. FINDINGS: CHEST: Cardiovascular: No aortic injury. The thoracic aorta is normal in caliber. The heart is enlarged in size. No significant pericardial effusion. At least 3 vessel coronary calcification. Mild atherosclerotic plaque. Mediastinum/Nodes: No pneumomediastinum. No mediastinal hematoma. The esophagus is unremarkable. Large volume hiatal hernia containing the majority of the stomach. The thyroid is unremarkable. The central airways are patent. No mediastinal, hilar, or axillary lymphadenopathy. Lungs/Pleura: No focal consolidation. No pulmonary nodule. No pulmonary mass. No pulmonary contusion or laceration. No pneumatocele formation. Bilateral trace, right greater than left, pleural effusions. No pneumothorax. No hemothorax. Musculoskeletal/Chest wall: No chest wall mass. No acute rib or sternal fracture. No spinal fracture. Old healed left rib fractures. ABDOMEN / PELVIS: Hepatobiliary: Not enlarged. No focal lesion. No laceration or subcapsular hematoma. Status post cholecystectomy.  No biliary ductal dilatation. Pancreas: Atrophic parenchyma. Otherwise normal pancreatic contour. No main pancreatic duct dilatation. Spleen: Not enlarged. No focal lesion. No laceration, subcapsular hematoma, or vascular injury. Adrenals/Urinary Tract:  No nodularity bilaterally. Bilateral kidneys enhance symmetrically. No hydronephrosis. No contusion, laceration, or subcapsular hematoma. No injury to the vascular structures or collecting systems. No hydroureter. The urinary bladder  is unremarkable. Stomach/Bowel: No small or large bowel wall thickening or dilatation. Status post appendectomy. Vasculature/Lymphatics: Moderate atherosclerotic plaque. No abdominal aorta or iliac aneurysm. No active contrast extravasation or pseudoaneurysm. No abdominal, pelvic, inguinal lymphadenopathy. Reproductive: Status post hysterectomy with limited evaluation due to streak artifact originating from the right femoral surgical hardware. Other: No simple free fluid ascites. No pneumoperitoneum. No hemoperitoneum. No mesenteric hematoma identified. No organized fluid collection. Musculoskeletal: No significant soft tissue hematoma. No acute pelvic fracture. No spinal fracture. L2-L4 poor posterolateral and interbody fusion surgical hardware. Status post total right hip arthroplasty. 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 Ports and Devices: None. IMPRESSION: 1. No acute intrathoracic, intra-abdominal, intrapelvic traumatic injury. 2. No acute fracture or traumatic malalignment of the thoracic or lumbar spine. Other imaging findings of potential clinical significance: 1. Cardiomegaly. 2. Bilateral trace, right greater than left, pleural effusions. 3. Large volume hiatal hernia containing the majority of the stomach. 4.  Aortic Atherosclerosis (ICD10-I70.0). Electronically Signed   By: Tish Frederickson M.D.   On: 04/17/2023 20:12   CT HEAD WO CONTRAST ( )  Result Date: 04/17/2023 CLINICAL DATA:  Polytrauma, blunt; Facial trauma, blunt Fall elevated on Eliquis this morning, near syncope for last 3 weeks. Left lower chest pain/left upper abdominal pain. Right facial injury. EXAM: CT HEAD WITHOUT CONTRAST CT MAXILLOFACIAL WITHOUT CONTRAST CT CERVICAL SPINE WITHOUT CONTRAST TECHNIQUE: Multidetector CT imaging of the head, cervical spine, and maxillofacial structures were performed using the standard protocol without intravenous contrast. Multiplanar CT image reconstructions of the cervical spine  and maxillofacial structures were also generated. RADIATION DOSE REDUCTION: This exam was performed according to the departmental dose-optimization program which includes automated exposure control, adjustment of the mA and/or kV according to patient size and/or use of iterative reconstruction technique. COMPARISON:  None Available. FINDINGS: CT HEAD FINDINGS Brain: Cerebral ventricle sizes are concordant with the degree of cerebral volume loss. No evidence of large-territorial acute infarction. No parenchymal hemorrhage. No mass lesion. No extra-axial collection. No mass effect or midline shift. No hydrocephalus. Basilar cisterns are patent. Vascular: No hyperdense vessel. Skull: No acute fracture or focal lesion. Other: A 1.3 cm right periorbital/frontal scalp hematoma formation. CT MAXILLOFACIAL FINDINGS Osseous: No fracture or mandibular dislocation. No destructive process. Sinuses/Orbits: Surgical changes of the sinuses. Paranasal sinuses and mastoid air cells are clear. Bilateral lens replacement. Otherwise the orbits are unremarkable. Soft tissues: Negative. CT CERVICAL SPINE FINDINGS Alignment: Reversal of the normal cervical lordosis centered at the C5-C6 level likely due to positioning. Skull base and vertebrae: Multilevel degenerative changes of the spine most prominent at the C5 through C7 levels. No acute fracture. No aggressive appearing focal osseous lesion or focal pathologic process. Soft tissues and spinal canal: No prevertebral fluid or swelling. No visible canal hematoma. Upper chest: Unremarkable. Other: None. IMPRESSION: 1. No acute intracranial abnormality. 2.  No acute displaced facial fracture. 3. No acute displaced fracture or traumatic listhesis of the cervical spine. Electronically Signed   By: Tish Frederickson M.D.   On: 04/17/2023 19:54   CT Cervical Spine Wo Contrast  Result Date: 04/17/2023 CLINICAL DATA:  Polytrauma, blunt; Facial trauma, blunt Fall elevated on Eliquis this  morning, near syncope for last 3 weeks. Left lower chest pain/left upper abdominal pain. Right facial injury. EXAM: CT HEAD WITHOUT CONTRAST CT MAXILLOFACIAL  WITHOUT CONTRAST CT CERVICAL SPINE WITHOUT CONTRAST TECHNIQUE: Multidetector CT imaging of the head, cervical spine, and maxillofacial structures were performed using the standard protocol without intravenous contrast. Multiplanar CT image reconstructions of the cervical spine and maxillofacial structures were also generated. RADIATION DOSE REDUCTION: This exam was performed according to the departmental dose-optimization program which includes automated exposure control, adjustment of the mA and/or kV according to patient size and/or use of iterative reconstruction technique. COMPARISON:  None Available. FINDINGS: CT HEAD FINDINGS Brain: Cerebral ventricle sizes are concordant with the degree of cerebral volume loss. No evidence of large-territorial acute infarction. No parenchymal hemorrhage. No mass lesion. No extra-axial collection. No mass effect or midline shift. No hydrocephalus. Basilar cisterns are patent. Vascular: No hyperdense vessel. Skull: No acute fracture or focal lesion. Other: A 1.3 cm right periorbital/frontal scalp hematoma formation. CT MAXILLOFACIAL FINDINGS Osseous: No fracture or mandibular dislocation. No destructive process. Sinuses/Orbits: Surgical changes of the sinuses. Paranasal sinuses and mastoid air cells are clear. Bilateral lens replacement. Otherwise the orbits are unremarkable. Soft tissues: Negative. CT CERVICAL SPINE FINDINGS Alignment: Reversal of the normal cervical lordosis centered at the C5-C6 level likely due to positioning. Skull base and vertebrae: Multilevel degenerative changes of the spine most prominent at the C5 through C7 levels. No acute fracture. No aggressive appearing focal osseous lesion or focal pathologic process. Soft tissues and spinal canal: No prevertebral fluid or swelling. No visible canal  hematoma. Upper chest: Unremarkable. Other: None. IMPRESSION: 1. No acute intracranial abnormality. 2.  No acute displaced facial fracture. 3. No acute displaced fracture or traumatic listhesis of the cervical spine. Electronically Signed   By: Tish Frederickson M.D.   On: 04/17/2023 19:54   CT Maxillofacial Wo Contrast  Result Date: 04/17/2023 CLINICAL DATA:  Polytrauma, blunt; Facial trauma, blunt Fall elevated on Eliquis this morning, near syncope for last 3 weeks. Left lower chest pain/left upper abdominal pain. Right facial injury. EXAM: CT HEAD WITHOUT CONTRAST CT MAXILLOFACIAL WITHOUT CONTRAST CT CERVICAL SPINE WITHOUT CONTRAST TECHNIQUE: Multidetector CT imaging of the head, cervical spine, and maxillofacial structures were performed using the standard protocol without intravenous contrast. Multiplanar CT image reconstructions of the cervical spine and maxillofacial structures were also generated. RADIATION DOSE REDUCTION: This exam was performed according to the departmental dose-optimization program which includes automated exposure control, adjustment of the mA and/or kV according to patient size and/or use of iterative reconstruction technique. COMPARISON:  None Available. FINDINGS: CT HEAD FINDINGS Brain: Cerebral ventricle sizes are concordant with the degree of cerebral volume loss. No evidence of large-territorial acute infarction. No parenchymal hemorrhage. No mass lesion. No extra-axial collection. No mass effect or midline shift. No hydrocephalus. Basilar cisterns are patent. Vascular: No hyperdense vessel. Skull: No acute fracture or focal lesion. Other: A 1.3 cm right periorbital/frontal scalp hematoma formation. CT MAXILLOFACIAL FINDINGS Osseous: No fracture or mandibular dislocation. No destructive process. Sinuses/Orbits: Surgical changes of the sinuses. Paranasal sinuses and mastoid air cells are clear. Bilateral lens replacement. Otherwise the orbits are unremarkable. Soft tissues:  Negative. CT CERVICAL SPINE FINDINGS Alignment: Reversal of the normal cervical lordosis centered at the C5-C6 level likely due to positioning. Skull base and vertebrae: Multilevel degenerative changes of the spine most prominent at the C5 through C7 levels. No acute fracture. No aggressive appearing focal osseous lesion or focal pathologic process. Soft tissues and spinal canal: No prevertebral fluid or swelling. No visible canal hematoma. Upper chest: Unremarkable. Other: None. IMPRESSION: 1. No acute intracranial abnormality. 2.  No acute  displaced facial fracture. 3. No acute displaced fracture or traumatic listhesis of the cervical spine. Electronically Signed   By: Tish Frederickson M.D.   On: 04/17/2023 19:54   DG Humerus Right  Result Date: 04/17/2023 CLINICAL DATA:  Near syncope, fall.  Rolled out of bed.  Pain. EXAM: RIGHT HUMERUS - 2+ VIEW COMPARISON:  None Available. FINDINGS: There is no evidence of fracture or other focal bone lesions. Cortical margins of the humerus are intact. Soft tissues are unremarkable. IMPRESSION: Negative radiographs of the right humerus. Electronically Signed   By: Narda Rutherford M.D.   On: 04/17/2023 17:29   DG Chest Portable 1 View  Result Date: 04/17/2023 CLINICAL DATA:  Syncope, fall. EXAM: PORTABLE CHEST 1 VIEW COMPARISON:  04/12/2023 FINDINGS: Stable cardiomegaly. Unchanged mediastinal contours. Retrocardiac hiatal hernia, moderate to large in size. Slight vascular congestion without edema. No pleural effusion or pneumothorax. No acute osseous findings. IMPRESSION: 1. Cardiomegaly with slight vascular congestion. 2. Moderate to large hiatal hernia. Electronically Signed   By: Narda Rutherford M.D.   On: 04/17/2023 17:28   DG Chest 2 View  Result Date: 04/12/2023 CLINICAL DATA:  Shortness of breath, wheezing, cough EXAM: CHEST - 2 VIEW COMPARISON:  12/11/2022 FINDINGS: Large hiatal hernia. Heart and mediastinal contours are within normal limits. No focal  opacities or effusions. No acute bony abnormality. IMPRESSION: Large hiatal hernia.  No active cardiopulmonary disease. Electronically Signed   By: Charlett Nose M.D.   On: 04/12/2023 19:12     TODAY-DAY OF DISCHARGE:  Subjective:   Stacy Allen today has no headache,no chest abdominal pain,no new weakness tingling or numbness, feels much better wants to go home today.   Objective:   Blood pressure 136/64, pulse 71, temperature 98 F (36.7 C), temperature source Tympanic, resp. rate 18, height 5\' 3"  (1.6 m), weight 108 kg, SpO2 98%.  Intake/Output Summary (Last 24 hours) at 04/21/2023 1058 Last data filed at 04/21/2023 0906 Gross per 24 hour  Intake 300 ml  Output --  Net 300 ml   Filed Weights   04/17/23 1648  Weight: 108 kg    Exam: Awake Alert, Oriented *3, No new F.N deficits, Normal affect [place].AT,PERRAL Supple Neck,No JVD, No cervical lymphadenopathy appriciated.  Symmetrical Chest wall movement, Good air movement bilaterally, CTAB RRR,No Gallops,Rubs or new Murmurs, No Parasternal Heave +ve B.Sounds, Abd Soft, Non tender, No organomegaly appriciated, No rebound -guarding or rigidity. No Cyanosis, Clubbing or edema, No new Rash or bruise   PERTINENT RADIOLOGIC STUDIES: DG Shoulder Left  Result Date: 04/19/2023 CLINICAL DATA:  Fall EXAM: LEFT SHOULDER - 2+ VIEW COMPARISON:  Left shoulder x-ray 12/11/2022 FINDINGS: There is subacromial joint space narrowing. There is no fracture or dislocation. Bones are osteopenic. The soft tissues are within normal limits. IMPRESSION: 1. No fracture or dislocation. 2. 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 Electronically Signed   By: Darliss Cheney M.D.   On: 04/19/2023 17:16   ECHOCARDIOGRAM COMPLETE  Result Date: 04/19/2023    ECHOCARDIOGRAM REPORT   Patient Name:   SHELMA MILLWOOD Date of Exam: 04/19/2023 Medical Rec #:  [id number]      Height:       63.0 in Accession #:    [id number]      Weight:       238.1 lb Date of Birth:  09-02-1940     BSA:          2.082 m Patient Age:  81 years       BP:           130/82 mmHg Patient Gender: F              HR:           63 bpm. Exam Location:  Inpatient Procedure: 2D Echo, Cardiac Doppler, Color Doppler and Intracardiac            Opacification Agent Indications:    Other abnomalities of the heart  History:        Patient has no prior history of Echocardiogram examinations.                 Risk Factors:Hypertension.  Sonographer:    Karma Ganja Referring Phys: [phone] A CALDWELL POWELL JR IMPRESSIONS  1. Left ventricular ejection fraction, by estimation, is 55 to 60%. The left ventricle has normal function. The left ventricle has no regional wall motion abnormalities. Left ventricular diastolic parameters are consistent with Grade II diastolic dysfunction (pseudonormalization).  2. Right ventricular systolic function is normal. The right ventricular size is normal. Tricuspid regurgitation signal is inadequate for assessing PA pressure.  3. Left atrial size was moderately dilated.  4. Right atrial size was severely dilated.  5. The mitral valve is normal in structure. No evidence of mitral valve regurgitation. No evidence of mitral stenosis.  6. The aortic valve was not well visualized. Aortic valve regurgitation is not visualized. No aortic stenosis is present.  7. The inferior vena cava is dilated in size with >50% respiratory variability, suggesting right atrial pressure of 8 mmHg. FINDINGS  Left Ventricle: Left ventricular ejection fraction, by estimation, is 55 to 60%. The left ventricle has normal function. The left ventricle has no regional wall motion abnormalities. Definity contrast agent was given IV to delineate the left ventricular  endocardial borders. The left ventricular internal cavity size was normal in size. There is no left ventricular hypertrophy. Left ventricular diastolic parameters are consistent with Grade II diastolic dysfunction  (pseudonormalization). Right Ventricle: The right ventricular size is normal. No increase in right ventricular wall thickness. Right ventricular systolic function is normal. Tricuspid regurgitation signal is inadequate for assessing PA pressure. Left Atrium: Left atrial size was moderately dilated. Right Atrium: Right atrial size was severely dilated. Pericardium: Trivial pericardial effusion is present. Mitral Valve: The mitral valve is normal in structure. No evidence of mitral valve regurgitation. No evidence of mitral valve stenosis. Tricuspid Valve: The tricuspid valve is normal in structure. Tricuspid valve regurgitation is trivial. Aortic Valve: The aortic valve was not well visualized. Aortic valve regurgitation is not visualized. No aortic stenosis is present. Aortic valve mean gradient measures 3.0 mmHg. Aortic valve peak gradient measures 7.6 mmHg. Aortic valve area, by VTI measures 2.51 cm. Pulmonic Valve: The pulmonic valve was normal in structure. Pulmonic valve regurgitation is not visualized. Aorta: The aortic root is normal in size and structure. Venous: The inferior vena cava is dilated in size with greater than 50% respiratory variability, suggesting right atrial pressure of 8 mmHg. IAS/Shunts: No atrial level shunt detected by color flow Doppler.  LEFT VENTRICLE PLAX 2D LVIDd:         5.10 cm      Diastology LVIDs:         3.80 cm      LV e' medial:    14.50 cm/s LV PW:         0.90 cm      LV E/e' medial:  9.2  LV IVS:        0.80 cm      LV e' lateral:   12.10 cm/s LVOT diam:     2.00 cm      LV E/e' lateral: 11.1 LV SV:         67 LV SV Index:   32 LVOT Area:     3.14 cm  LV Volumes (MOD) LV vol d, MOD A2C: 104.0 ml LV vol d, MOD A4C: 64.3 ml LV vol s, MOD A2C: 12.7 ml LV vol s, MOD A4C: 16.9 ml LV SV MOD A2C:     91.3 ml LV SV MOD A4C:     64.3 ml LV SV MOD BP:      70.2 ml RIGHT VENTRICLE             IVC RV Basal diam:  3.90 cm     IVC diam: 2.30 cm RV S prime:     17.40 cm/s TAPSE  (M-mode): 3.0 cm LEFT ATRIUM              Index        RIGHT ATRIUM           Index LA diam:        4.30 cm  2.06 cm/m   RA Area:     29.00 cm LA Vol (A2C):   140.0 ml 67.23 ml/m  RA Volume:   94.50 ml  45.38 ml/m LA Vol (A4C):   91.4 ml  43.89 ml/m LA Biplane Vol: 114.0 ml 54.75 ml/m  AORTIC VALVE                    PULMONIC VALVE AV Area (Vmax):    1.92 cm     PV Vmax:       0.92 m/s AV Area (Vmean):   2.02 cm     PV Vmean:      61.500 cm/s AV Area (VTI):     2.51 cm     PV VTI:        0.209 m AV Vmax:           138.00 cm/s  PV Peak grad:  3.4 mmHg AV Vmean:          82.000 cm/s  PV Mean grad:  2.0 mmHg AV VTI:            0.265 m AV Peak Grad:      7.6 mmHg AV Mean Grad:      3.0 mmHg LVOT Vmax:         84.20 cm/s LVOT Vmean:        52.700 cm/s LVOT VTI:          0.212 m LVOT/AV VTI ratio: 0.80  AORTA Ao Root diam: 2.80 cm Ao Asc diam:  3.00 cm MITRAL VALVE MV Area (PHT): 3.77 cm     SHUNTS MV Decel Time: 201 msec     Systemic VTI:  0.21 m MV E velocity: 134.00 cm/s  Systemic Diam: 2.00 cm MV A velocity: 78.40 cm/s MV E/A ratio:  1.71 Dalton McleanMD Electronically signed by Wilfred Lacy Signature Date/Time: 04/19/2023/4:54:08 PM    Final      PERTINENT LAB RESULTS: CBC: Recent Labs    04/19/23 0322 04/20/23 0608  WBC 6.2 7.0  HGB 8.0* 8.6*  HCT 26.8* 28.4*  PLT 236 266   CMET CMP     Component Value Date/Time   NA 134 (L) 04/19/2023 0322  NA 131 (L) 10/18/2017 1441   NA 136 09/13/2012 1157   K 3.6 04/19/2023 0322   K 3.3 (L) 09/13/2012 1157   CL 99 04/19/2023 0322   CL 103 09/13/2012 1157   CO2 24 04/19/2023 0322   CO2 24 09/13/2012 1157   GLUCOSE 92 04/19/2023 0322   GLUCOSE 106 (H) 09/13/2012 1157   BUN 9 04/19/2023 0322   BUN 18 10/18/2017 1441   BUN 10 09/13/2012 1157   CREATININE 0.61 04/19/2023 0322   CREATININE 0.85 09/13/2012 1157   CALCIUM 8.3 (L) 04/19/2023 0322   CALCIUM 8.6 09/13/2012 1157   PROT 5.9 (L) 04/19/2023 0322   PROT 7.2 10/18/2017 1441    PROT 7.3 09/13/2012 1157   ALBUMIN 3.1 (L) 04/19/2023 0322   ALBUMIN 4.1 10/18/2017 1441   ALBUMIN 3.5 09/13/2012 1157   AST 10 (L) 04/19/2023 0322   AST 15 09/13/2012 1157   ALT 12 04/19/2023 0322   ALT 19 09/13/2012 1157   ALKPHOS 52 04/19/2023 0322   ALKPHOS 100 09/13/2012 1157   BILITOT 1.8 (H) 04/19/2023 0322   BILITOT 0.6 10/18/2017 1441   BILITOT 0.6 09/13/2012 1157   GFRNONAA >60 04/19/2023 0322   GFRNONAA >60 11/20/2011 1229    GFR Estimated Creatinine Clearance: 66.1 mL/min (by C-G formula based on SCr of 0.61 mg/dL). No results for input(s): "LIPASE", "AMYLASE" in the last 72 hours. No results for input(s): "CKTOTAL", "CKMB", "CKMBINDEX", "TROPONINI" in the last 72 hours. Invalid input(s): "POCBNP" No results for input(s): "DDIMER" in the last 72 hours. No results for input(s): "HGBA1C" in the last 72 hours. No results for input(s): "CHOL", "HDL", "LDLCALC", "TRIG", "CHOLHDL", "LDLDIRECT" in the last 72 hours. No results for input(s): "TSH", "T4TOTAL", "T3FREE", "THYROIDAB" in the last 72 hours.  Invalid input(s): "FREET3" Recent Labs    04/19/23 0322  FERRITIN 10*  TIBC 447  IRON 25*   Coags: Recent Labs    04/18/23 1739 04/19/23 0322  INR 1.2 1.1   Microbiology: Recent Results (from the past 240 hour(s))  Urine Culture     Status: Abnormal   Collection Time: 04/17/23  7:45 PM   Specimen: Urine, Clean Catch  Result Value Ref Range Status   Specimen Description URINE, CLEAN CATCH  Final   Special Requests NONE  Final   Culture (A)  Final    <10,000 COLONIES/mL INSIGNIFICANT GROWTH Performed at [facility], [address] [place], [place] [address]    Report Status 04/18/2023 FINAL  Final    FURTHER DISCHARGE INSTRUCTIONS:  Get Medicines reviewed and adjusted: Please take all your medications with you for your next visit with your Primary MD  Laboratory/radiological data: Please request your Primary MD to go over all hospital tests and  procedure/radiological results at the follow up, please ask your Primary MD to get all Hospital records sent to his/her office.  In some cases, they will be blood work, cultures and biopsy results pending at the time of your discharge. Please request that your primary care M.D. goes through all the records of your hospital data and follows up on these results.  Also Note the following: If you experience worsening of your admission symptoms, develop shortness of breath, life threatening emergency, suicidal or homicidal thoughts you must seek medical attention immediately by calling 911 or calling your MD immediately  if symptoms less severe.  You must read complete instructions/literature along with all the possible adverse reactions/side effects for all the Medicines you take and that  have been prescribed to you. Take any new Medicines after you have completely understood and accpet all the possible adverse reactions/side effects.   Do not drive when taking Pain medications or sleeping medications (Benzodaizepines)  Do not take more than prescribed Pain, Sleep and Anxiety Medications. It is not advisable to combine anxiety,sleep and pain medications without talking with your primary care practitioner  Special Instructions: If you have smoked or chewed Tobacco  in the last 2 yrs please stop smoking, stop any regular Alcohol  and or any Recreational drug use.  Wear Seat belts while driving.  Please note: You were cared for by a hospitalist during your hospital stay. Once you are discharged, your primary care physician will handle any further medical issues. Please note that NO REFILLS for any discharge medications will be authorized once you are discharged, as it is imperative that you return to your primary care physician (or establish a relationship with a primary care physician if you do not have one) for your post hospital discharge needs so that they can reassess your need for medications and  monitor your lab values.  Total Time spent coordinating discharge including counseling, education and face to face time equals greater than 30 minutes.  SignedJeoffrey Massed 04/21/2023 10:58 AM

## 2023-04-21 NOTE — Interval H&P Note (Signed)
History and Physical Interval Note: Patient states she drank prep and she is clear. Here for colonoscopy to evaluate IDA, anticoagulated. I have discussed risks / benefits and she understands and wants to proceed. Further recommendations pending results.   04/21/2023 8:21 AM  Stacy Allen  has presented today for surgery, with the diagnosis of anemia, GI bleeding.  The various methods of treatment have been discussed with the patient and family. After consideration of risks, benefits and other options for treatment, the patient has consented to  Procedure(s): COLONOSCOPY WITH PROPOFOL (N/A) as a surgical intervention.  The patient's history has been reviewed, patient examined, no change in status, stable for surgery.  I have reviewed the patient's chart and labs.  Questions were answered to the patient's satisfaction.     Viviann Spare P Jayleena Stille

## 2023-04-24 ENCOUNTER — Encounter (HOSPITAL_COMMUNITY): Payer: Self-pay | Admitting: Gastroenterology

## 2023-04-24 LAB — SURGICAL PATHOLOGY

## 2023-04-25 ENCOUNTER — Ambulatory Visit: Payer: Medicare HMO | Admitting: Clinical

## 2023-04-25 DIAGNOSIS — F419 Anxiety disorder, unspecified: Secondary | ICD-10-CM | POA: Diagnosis not present

## 2023-04-25 DIAGNOSIS — F330 Major depressive disorder, recurrent, mild: Secondary | ICD-10-CM

## 2023-04-25 NOTE — Progress Notes (Signed)
[facility] Counselor/Therapist Progress Note  Patient ID: Stacy Allen, MRN: [id number],    Date: 04/25/2023  Time Spent: 12:37pm - 1:29pm : 52 minutes   Treatment Type: Individual Therapy  Reported Symptoms: none reported   Mental Status Exam: Appearance:  Neat and Well Groomed     Behavior: Appropriate  Motor: Normal  Speech/Language:  Clear and Coherent  Affect: Appropriate  Mood: normal  Thought process: normal  Thought content:   WNL  Sensory/Perceptual disturbances:   WNL  Orientation: oriented to person, place, and situation  Attention: Good  Concentration: Good  Memory: WNL  Fund of knowledge:  Good  Insight:   Fair  Judgment:  Good  Impulse Control: Good   Risk Assessment: Danger to Self:  No Patient denied current suicidal ideation  Self-injurious Behavior: No Danger to Others: No Patient denied current homicidal ideation Duty to Warn:no Physical Aggression / Violence:No  Access to Firearms a concern: No  Gang Involvement:No   Subjective: Patient reported she recently fell out of her bed and hit her head and shoulders. Patient reported she was admitted to the hospital on Monday and was discharged on Friday. Patient reported she had a CT, multiple x-rays, blood work, and multiple tests during recent hospitalization. Patient reported multiple values on patient's blood work were low. Patient reported she had a colonoscopy and endoscopy while in the hospital and stated, "I felt good about that" in response to colonoscopy. Patient reported she will start receiving home health services this week. Patient reported she became upset when she did not receive food in the hospital. Patient stated, "I'm doing pretty good, I won't say I'm not worried" in response to patient's mood. Patient reported she is scared to resume dosage of 2 eliquis.  Patient reported concern related to her nephew's marriage and asked her family not to discuss nephew's marriage in  patient's presence. Patient stated, "my heart is broken over Ron's (nephew) marriage".  Patient stated, "I just never thought somebody's situation would hit me that hard". Patient reported she is grateful for a dish organizer, addition to her mattress,  visits from family while patient was in the hospital, and placemats. Patient stated, "its pretty good" in response to mood today.   Interventions: Cognitive Behavioral Therapy and supportive therapy.  Clinician conducted session in person at clinician's office at [facility]. Reviewed events since last session. Provided supportive therapy, active listening, and validation as patient discussed recent hospitalization and patient's thoughts/feelings in response. Assessed patient's mood. Discussed patient's thoughts/feelings associated with increase in medication (eliquis). Assisted patient in exploring and identifying triggers for changes in mood. Assisted patient in exploring and identifying areas of gratitude. Clinician requested for homework patient continue gratitude journal and practice visualization/imagery exercises.    Collaboration of Care: not required at this time   Diagnosis:  Mild episode of recurrent major depressive disorder (HCC)   Anxiety disorder, unspecified type     Plan: Patient is to utilize [employer] [facility], thought re-framing, mindfulness and coping strategies to decrease symptoms associated with their diagnosis. Frequency: bi-weekly  Modality: individual      Long-term goal:   Patient stated, "I guess I want to see my being able to recognize and appreciate what I have instead of focusing on all I've lost".  Target Date: 12/01/23  Progress: progressing    Short-term goal:  Write at least one positive affirmation about herself and/or patient's environment each day Target Date: 06/02/23  Progress: progressing    Practice  gratitude exercises to decrease symptoms of depression Target Date: 06/02/23   Progress: progressing    Identify, challenge, and replace negative thought patterns that contribute to feelings of depression and anxiety with positive thoughts and beliefs per patient's report Target Date: 06/02/23  Progress: progressing    Develop and implement strategies to increase self care, such as, participating in water exercise class, accepting invitations to social functions   Target Date: 06/02/23  Progress: progressing    verbalize positive statements regarding self and her ability to cope with life stressors     Target Date: 06/02/23  Progress: progressing    Doree Barthel, LCSW

## 2023-04-25 NOTE — Progress Notes (Signed)
                Dezi Schaner, LCSW 

## 2023-04-27 ENCOUNTER — Other Ambulatory Visit: Payer: Self-pay

## 2023-04-28 ENCOUNTER — Other Ambulatory Visit: Payer: Self-pay

## 2023-04-30 ENCOUNTER — Other Ambulatory Visit: Payer: Self-pay

## 2023-05-02 ENCOUNTER — Other Ambulatory Visit: Payer: Self-pay

## 2023-05-02 MED ORDER — SUCRALFATE 1 GM/10ML PO SUSP
1.0000 g | Freq: Three times a day (TID) | ORAL | 0 refills | Status: DC
  Filled 2023-05-02: qty 280, 7d supply, fill #0

## 2023-05-03 ENCOUNTER — Other Ambulatory Visit: Payer: Self-pay

## 2023-05-15 ENCOUNTER — Encounter: Payer: Self-pay | Admitting: Urology

## 2023-05-15 ENCOUNTER — Ambulatory Visit: Payer: Medicare HMO | Admitting: Urology

## 2023-05-15 VITALS — BP 139/80 | HR 81 | Ht 63.0 in

## 2023-05-15 DIAGNOSIS — Z87440 Personal history of urinary (tract) infections: Secondary | ICD-10-CM

## 2023-05-15 DIAGNOSIS — R319 Hematuria, unspecified: Secondary | ICD-10-CM | POA: Diagnosis not present

## 2023-05-15 DIAGNOSIS — N3020 Other chronic cystitis without hematuria: Secondary | ICD-10-CM

## 2023-05-15 LAB — URINALYSIS, COMPLETE
Bilirubin, UA: NEGATIVE
Glucose, UA: NEGATIVE
Ketones, UA: NEGATIVE
Leukocytes,UA: NEGATIVE
Nitrite, UA: NEGATIVE
Protein,UA: NEGATIVE
Specific Gravity, UA: 1.015 (ref 1.005–1.030)
Urobilinogen, Ur: 0.2 mg/dL (ref 0.2–1.0)
pH, UA: 7 (ref 5.0–7.5)

## 2023-05-15 LAB — MICROSCOPIC EXAMINATION: Epithelial Cells (non renal): >10 /[HPF] — AB (ref 0–10)

## 2023-05-15 MED ORDER — TRIMETHOPRIM 100 MG PO TABS: 100.0000 mg | ORAL_TABLET | Freq: Every day | ORAL | 11 refills | Status: DC

## 2023-05-15 NOTE — Patient Instructions (Signed)

## 2023-05-15 NOTE — Progress Notes (Signed)
05/15/2023 10:43 AM   Stacy Allen [birth year] [id number]  Referring provider: Jodi Marble, NP [address] [place],  [place] [address]  Chief Complaint  Patient presents with   New Patient (Initial Visit)   Hematuria    HPI: I was consulted to assess the patient's urinary incontinence and infections.  She has urge incontinence.  No bedwetting.  I believe if she has to go too far she has foot on the floor syndrome.  She may rarely leak with sneezing.  She was not a strong historian.  She says she does not wear pads normally but wears a pull-up at night  It also appears that the patient was referred for microscopic hematuria  She can void every hour and then hold it for several hours.  She can get up 10 times at night.  Sometimes has ankle edema.  No diuretic.  She will have urgency and get a false signal and then have a poor flow  She thinks he gets a bladder infection every 2 months with burning urgency that respond favorably to antibiotics.  She said currently it hurts a little bit at the end of urination but the urine is otherwise clear  She has had low back surgery.  She has had a hysterectomy.  I reviewed urine culture from the last few years and 2 were negative and 1 was positive.  She had a normal CT scan June 2023.  It turns out she had a CT scan of the abdomen pelvis and chest in October when she was admitted and it was within normal limits  She no longer takes daily Eliquis.  She is not on aspirin.  She has a smoking history   PMH: Past Medical History:  Diagnosis Date   Adenomatous polyps    Allergic state    Anemia    Anginal pain (HCC)    Anxiety    Arthritis    osteoarthritis   Broken rib    left   Cancer (HCC)    skin   CHF (congestive heart failure) (HCC)    Chicken pox    Complication of anesthesia    respiratory distress after rectocele surgery   Coronary artery disease    Depression    Eczema    Fibromyalgia    GERD (gastroesophageal  reflux disease)    Headache    migraines   Hemorrhoids    History of hiatal hernia    Hypercalcemia    Hyperlipidemia    Hypertension    Hypothyroidism    Lumbar stenosis    Obesity    Peptic ulcer disease    Shortness of breath dyspnea    Sleep apnea    No CPAP   Vertigo    Vitamin D deficiency     Surgical History: Past Surgical History:  Procedure Laterality Date   ABDOMINAL HYSTERECTOMY     BACK SURGERY  2007   Dr. Gerrit Heck, [facility], Spinal Fusion   CARDIAC CATHETERIZATION N/A 04/03/2015   Procedure: Left Heart Cath and Coronary Angiography;  Surgeon: Dalia Heading, MD;  Location: [facility] INVASIVE CV LAB;  Service: Cardiovascular;  Laterality: N/A;   CATARACT EXTRACTION W/PHACO Left 04/22/2019   Procedure: CATARACT EXTRACTION PHACO AND INTRAOCULAR LENS PLACEMENT (IOC) LEFT  00:45.4  12.9%  6.03;  Surgeon: Nevada Crane, MD;  Location: [facility] SURGERY [facility]TR;  Service: Ophthalmology;  Laterality: Left;  Latex sleep apnea   CATARACT EXTRACTION W/PHACO Right 07/01/2019   Procedure: CATARACT EXTRACTION PHACO AND INTRAOCULAR  LENS PLACEMENT (IOC) RIGHT 2.38  00:24.4;  Surgeon: Nevada Crane, MD;  Location: [facility] SUR[facility]ERY [facility];  Service: Ophthalmology;  Laterality: Right;  Latex Sleep apnea   CHOLECYSTECTOMY     COLONOSCOPY WITH PROPOFOL N/A 04/21/2023   Procedure: COLONOSCOPY WITH PROPOFOL;  Surgeon: Benancio Deeds, MD;  Location: [facility] ENDOSCOPY;  Service: Gastroenterology;  Laterality: N/A;   CORONARY ANGIOPLASTY  2015   Dr. Lady Gary, [facility] Cath Lab   ESOPHAGOGASTRODUODENOSCOPY (EGD) WITH PROPOFOL N/A 12/21/2016   Procedure: ESOPHAGOGASTRODUODENOSCOPY (EGD) WITH PROPOFOL;  Surgeon: Scot Jun, MD;  Location: [facility] ENDOSCOPY;  Service: Endoscopy;  Laterality: N/A;   ESOPHAGOGASTRODUODENOSCOPY (EGD) WITH PROPOFOL N/A 12/08/2022   Procedure: ESOPHAGOGASTRODUODENOSCOPY (EGD) WITH PROPOFOL;  Surgeon: Jaynie Collins, DO;  Location: [facility] ENDOSCOPY;  Service:  Gastroenterology;  Laterality: N/A;   ESOPHAGOGASTRODUODENOSCOPY (EGD) WITH PROPOFOL N/A 04/19/2023   Procedure: ESOPHAGOGASTRODUODENOSCOPY (EGD) WITH PROPOFOL;  Surgeon: Benancio Deeds, MD;  Location: [facility] ENDOSCOPY;  Service: Gastroenterology;  Laterality: N/A;   EYE SURGERY     FRACTURE SURGERY Left 1991   Fractures Femur, [facility]   HEMOSTASIS CLIP PLACEMENT  04/19/2023   Procedure: HEMOSTASIS CLIP PLACEMENT;  Surgeon: Benancio Deeds, MD;  Location: [facility] ENDOSCOPY;  Service: Gastroenterology;;   HIP ARTHROPLASTY Right 05/07/2020   Procedure: ARTHROPLASTY RIGHT HIP (HEMIARTHROPLASTY);  Surgeon: Juanell Fairly, MD;  Location: [facility] ORS;  Service: Orthopedics;  Laterality: Right;   IMAGE GUIDED SINUS SURGERY Bilateral 09/28/2015   Procedure: IMAGE GUIDED SINUS SURGERY, SEPTOPLASTY, BILATERAL FRONTAL SINUSOTOMIES, BILATERAL MAXILLARY ANTROSTOMIES, BILATERAL TOTAL ETHMOIDECTOMY, BILATERAL SPHENOIDECTOMY, BILATERAL INFERIOR TURBINATE REDUCTION;  Surgeon: Vernie Murders, MD;  Location: [facility] ORS;  Service: ENT;  Laterality: Bilateral;   LEFT HEART CATH AND CORONARY ANGIOGRAPHY Left 01/19/2023   Procedure: LEFT HEART CATH AND CORONARY ANGIOGRAPHY;  Surgeon: Marcina Millard, MD;  Location: [facility] INVASIVE CV LAB;  Service: Cardiovascular;  Laterality: Left;   POLYPECTOMY  04/19/2023   Procedure: POLYPECTOMY;  Surgeon: Benancio Deeds, MD;  Location: [facility] ENDOSCOPY;  Service: Gastroenterology;;   POLYPECTOMY  04/21/2023   Procedure: POLYPECTOMY;  Surgeon: Benancio Deeds, MD;  Location: [facility] ENDOSCOPY;  Service: Gastroenterology;;   RECTOCELE REPAIR      Home Medications:  Allergies as of 05/15/2023       Reactions   Azithromycin Diarrhea, Rash   "fainting" Other reaction(s): Syncope  "fainting"    Other reaction(s): NOT KNOWN  "fainting"   Meloxicam Diarrhea   Morphine Other (See Comments), Rash   Other reaction(s): Unknown Retain urine Other reaction(s): NOT  KNOWN, Other (See Comments) Retain urine Other reaction(s): side effect of severe rash   Adhesive [tape]    blisters   Codeine Nausea And Vomiting   Lactose Intolerance (gi) Other (See Comments)   "stomach pain"   Red Dye #40 (allura Red) Itching   "red food dye"   Shellfish-derived Products Itching, Swelling   Aspirin Rash   Latex Rash   With extended contact   Lodine [etodolac] Rash   Morphine And Codeine Hives, Nausea And Vomiting   Other reaction(s): UNKNOWN   Penicillins Rash, Hives   Given on 05/07/20.  No reaction noted.   Pravastatin Rash        Medication List        Accurate as of May 15, 2023 10:43 AM. If you have any questions, ask your nurse or doctor.          acetaminophen 500 MG tablet Commonly known as: TYLENOL Take 500 mg by mouth every 6 (  six) hours as needed.   ADVAIR HFA IN Inhale 1 puff into the lungs daily as needed.   albuterol 108 (90 Base) MCG/ACT inhaler Commonly known as: VENTOLIN HFA Inhale 2 puffs into the lungs.   amLODipine 10 MG tablet Commonly known as: NORVASC Take 0.5 tablets (5 mg total) by mouth daily.   apixaban 5 MG Tabs tablet Commonly known as: Eliquis Take 1 tablet (5 mg total) by mouth 2 (two) times daily. Then 1 tablet (5mg ) twice daily   carboxymethylcellulose 0.5 % Soln Commonly known as: REFRESH PLUS Place 1 drop into both eyes daily as needed (dry eyes).   citalopram 40 MG tablet Commonly known as: CELEXA Take 40 mg by mouth daily.   clonazePAM 1 MG tablet Commonly known as: KLONOPIN Take 1 mg by mouth at bedtime.   EPINEPHrine 0.3 mg/0.3 mL Soaj injection Commonly known as: EPI-PEN Inject 0.3 mg into the muscle once.   FeroSul 325 (65 Fe) MG tablet Generic drug: ferrous sulfate Take 325 mg by mouth daily as needed (when tolerable).   gabapentin 600 MG tablet Commonly known as: NEURONTIN Take 600-1,200 mg by mouth See admin instructions. Take 1 tablet (600mg ) by mouth in the morning and 2  tablets (1200mg ) by mouth at bedtime   levothyroxine 125 MCG tablet Commonly known as: SYNTHROID Take 1 tablet (125 mcg total) by mouth daily.   metoprolol tartrate 25 MG tablet Commonly known as: LOPRESSOR Take 1 tablet (25 mg total) by mouth daily.   omeprazole 40 MG capsule Commonly known as: PRILOSEC Take 1 capsule (40 mg total) by mouth 2 (two) times daily before a meal.   ondansetron 4 MG disintegrating tablet Commonly known as: ZOFRAN-ODT Take 1 tablet (4 mg total) by mouth every 8 (eight) hours as needed for nausea or vomiting.   sucralfate 1 GM/10ML suspension Commonly known as: CARAFATE Take 10 mLs (1 g total) by mouth 4 (four) times daily -  with meals and at bedtime for 7 days. What changed: Another medication with the same name was removed. Continue taking this medication, and follow the directions you see here. Changed by: Lorin Picket A Baldemar Dady   tiZANidine 4 MG tablet Commonly known as: ZANAFLEX Take 4 mg by mouth at bedtime as needed for muscle spasms. Only uses 1/4 of medication        Allergies:  Allergies  Allergen Reactions   Azithromycin Diarrhea and Rash    "fainting"  Other reaction(s): Syncope  "fainting"    Other reaction(s): NOT KNOWN  "fainting"   Meloxicam Diarrhea   Morphine Other (See Comments) and Rash    Other reaction(s): Unknown Retain urine Other reaction(s): NOT KNOWN, Other (See Comments) Retain urine Other reaction(s): side effect of severe rash   Adhesive [Tape]     blisters   Codeine Nausea And Vomiting   Lactose Intolerance (Gi) Other (See Comments)    "stomach pain"   Red Dye #40 (Allura Red) Itching    "red food dye"   Shellfish-Derived Products Itching and Swelling   Aspirin Rash   Latex Rash    With extended contact   Lodine [Etodolac] Rash   Morphine And Codeine Hives and Nausea And Vomiting    Other reaction(s): UNKNOWN   Penicillins Rash and Hives    Given on 05/07/20.  No reaction noted.   Pravastatin Rash     Family History: Family History  Problem Relation Age of Onset   Hypertension Mother    Cancer Mother    Diabetes  Father    Hypertension Father    Heart failure Father    Breast cancer Neg Hx     Social History:  reports that she quit smoking about 24 years ago. Her smoking use included cigarettes. She has been exposed to tobacco smoke. She has never used smokeless tobacco. She reports that she does not drink alcohol and does not use drugs.  ROS:                                        Physical Exam: BP 139/80   Pulse 81   Ht 5\' 3"  (1.6 m)   BMI 42.18 kg/m   Constitutional:  Alert and oriented, No acute distress. HEENT: [place] AT, moist mucus membranes.  Trachea midline, no masses.  Laboratory Data: Lab Results  Component Value Date   WBC 7.0 04/20/2023   HGB 8.6 (L) 04/20/2023   HCT 28.4 (L) 04/20/2023   MCV 83.0 04/20/2023   PLT 266 04/20/2023    Lab Results  Component Value Date   CREATININE 0.61 04/19/2023    No results found for: "PSA"  No results found for: "TESTOSTERONE"  No results found for: "HGBA1C"  Urinalysis    Component Value Date/Time   COLORURINE STRAW (A) 04/17/2023 1808   APPEARANCEUR CLEAR 04/17/2023 1808   APPEARANCEUR CLEAR 07/23/2014 1444   LABSPEC 1.010 04/17/2023 1808   LABSPEC 1.005 07/23/2014 1444   PHURINE 7.0 04/17/2023 1808   GLUCOSEU NEGATIVE 04/17/2023 1808   GLUCOSEU NEGATIVE 07/23/2014 1444   HGBUR NEGATIVE 04/17/2023 1808   BILIRUBINUR NEGATIVE 04/17/2023 1808   BILIRUBINUR NEGATIVE 07/23/2014 1444   KETONESUR NEGATIVE 04/17/2023 1808   PROTEINUR NEGATIVE 04/17/2023 1808   NITRITE NEGATIVE 04/17/2023 1808   LEUKOCYTESUR NEGATIVE 04/17/2023 1808   LEUKOCYTESUR NEGATIVE 07/23/2014 1444    Pertinent Imaging: Urine reviewed and sent for culture.  Chart reviewed  Assessment & Plan: Pathophysiology of UTIs discussed.  I would like to put her on trimethoprim 100 mg 30 x 11 and have her come back  in 6 to 8 weeks for cystoscopy and pelvic examination.  Hopefully some of her urgency and frequency will also settle down.  She likely also has a nocturnal diuresis.  Workup for hematuria discussed.  Return on trimethoprim for cystoscopy.    It will be interesting to some of her symptoms especially the nocturia down regulate some on the trimethoprim.  1. Hematuria, unspecified type  - Urinalysis, Complete   No follow-ups on file.  Martina Sinner, MD  [facility] [address] [place], [place] [address] [phone]

## 2023-05-18 ENCOUNTER — Ambulatory Visit: Payer: Medicare HMO | Admitting: Clinical

## 2023-05-18 LAB — CULTURE, URINE COMPREHENSIVE

## 2023-05-29 ENCOUNTER — Other Ambulatory Visit: Payer: Self-pay

## 2023-06-08 ENCOUNTER — Encounter (INDEPENDENT_AMBULATORY_CARE_PROVIDER_SITE_OTHER): Payer: Self-pay | Admitting: Nurse Practitioner

## 2023-06-08 ENCOUNTER — Ambulatory Visit (INDEPENDENT_AMBULATORY_CARE_PROVIDER_SITE_OTHER): Payer: Medicare HMO | Admitting: Nurse Practitioner

## 2023-06-08 VITALS — BP 126/87 | HR 84 | Resp 18 | Ht 63.0 in | Wt 231.0 lb

## 2023-06-08 DIAGNOSIS — Z86718 Personal history of other venous thrombosis and embolism: Secondary | ICD-10-CM

## 2023-06-08 DIAGNOSIS — I10 Essential (primary) hypertension: Secondary | ICD-10-CM

## 2023-06-08 DIAGNOSIS — D649 Anemia, unspecified: Secondary | ICD-10-CM | POA: Diagnosis not present

## 2023-06-11 NOTE — Progress Notes (Signed)
Subjective:    Patient ID: Stacy Allen, female    DOB: 1941-02-18, 82 y.o.   MRN: [id number] Chief Complaint  Patient presents with   New Patient (Initial Visit)    np. consult. 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 stopped Eliquis due to concerns of recurrent bleeding. searching for other options for reducing recurrent DVT. stephenson, lars    Stacy Allen is an 82 year old female who presents today with primary care provider Lavinia Sharps in order to evaluate for place to prevent DVT.  The patient was on longstanding Eliquis 5 mg twice daily when she began to suffer GI bleed.  Once the GI bleed was reasonable she still continued to have a low hemoglobin level and it is felt that it may be related to her very large hiatal hernia that she has.  Given the bleeding she has not been on Eliquis and thankfully she has not had any recurrence of DVTs that she has not been on it.  Currently there is no evidence of significant swelling or evidence of possible thrombus.    Review of Systems  Neurological:  Positive for weakness.  All other systems reviewed and are negative.      Objective:   Physical Exam Vitals reviewed.  HENT:     Head: Normocephalic.  Cardiovascular:     Rate and Rhythm: Normal rate.     Pulses: Normal pulses.  Pulmonary:     Effort: Pulmonary effort is normal.  Skin:    General: Skin is warm and dry.  Neurological:     Mental Status: She is alert and oriented to person, place, and time.     Motor: Weakness present.  Psychiatric:        Mood and Affect: Mood normal.        Behavior: Behavior normal.        Thought Content: Thought content normal.        Judgment: Judgment normal.     BP 126/87   Pulse 84   Resp 18   Ht 5\' 3"  (1.6 m)   Wt 231 lb (104.8 kg)   BMI 40.92 kg/m   Past Medical History:  Diagnosis Date   Adenomatous polyps    Allergic state    Anemia    Anginal pain (HCC)    Anxiety    Arthritis    osteoarthritis    Broken rib    left   Cancer (HCC)    skin   CHF (congestive heart failure) (HCC)    Chicken pox    Complication of anesthesia    respiratory distress after rectocele surgery   Coronary artery disease    Depression    Eczema    Fibromyalgia    GERD (gastroesophageal reflux disease)    Headache    migraines   Hemorrhoids    History of hiatal hernia    Hypercalcemia    Hyperlipidemia    Hypertension    Hypothyroidism    Lumbar stenosis    Obesity    Peptic ulcer disease    Shortness of breath dyspnea    Sleep apnea    No CPAP   Vertigo    Vitamin D deficiency     Social History   Socioeconomic History   Marital status: Widowed    Spouse name: Not on file   Number of children: Not on file   Years of education: Not on file   Highest education level: Not on file  Occupational  History   Not on file  Tobacco Use   Smoking status: Former    Current packs/day: 0.00    Types: Cigarettes    Quit date: 2000    Years since quitting: 24.9    Passive exposure: Past   Smokeless tobacco: Never  Vaping Use   Vaping status: Never Used  Substance and Sexual Activity   Alcohol use: No   Drug use: No   Sexual activity: Not on file  Other Topics Concern   Not on file  Social History Narrative   Lives in camper in nephew's backyard. No pets.   Social Drivers of Corporate investment banker Strain: Low Risk  (06/02/2023)   Received from [facility]   Overall Financial Resource Strain (CARDIA)    Difficulty of Paying Living Expenses: Not hard at all  Food Insecurity: No Food Insecurity (06/02/2023)   Received from [facility]   Hunger Vital Sign    Worried About Running Out of Food in the Last Year: Never true    Ran Out of Food in the Last Year: Never true  Transportation Needs: No Transportation Needs (06/02/2023)   Received from [facility] - Transportation    In the past 12 months, has lack of  transportation kept you from medical appointments or from getting medications?: No    Lack of Transportation (Non-Medical): No  Physical Activity: Not on file  Stress: Not on file  Social Connections: Not on file  Intimate Partner Violence: Not At Risk (04/18/2023)   Humiliation, Afraid, Rape, and Kick questionnaire    Fear of Current or Ex-Partner: No    Emotionally Abused: No    Physically Abused: No    Sexually Abused: No    Past Surgical History:  Procedure Laterality Date   ABDOMINAL HYSTERECTOMY     BACK SURGERY  2007   Dr. Gerrit Heck, [facility], Spinal Fusion   CARDIAC CATHETERIZATION N/A 04/03/2015   Procedure: Left Heart Cath and Coronary Angiography;  Surgeon: Dalia Heading, MD;  Location: [facility] INVASIVE CV LAB;  Service: Cardiovascular;  Laterality: N/A;   CATARACT EXTRACTION W/PHACO Left 04/22/2019   Procedure: CATARACT EXTRACTION PHACO AND INTRAOCULAR LENS PLACEMENT (IOC) LEFT  00:45.4  12.9%  6.03;  Surgeon: Nevada Crane, MD;  Location: [facility] SURGERY CNTR;  Service: Ophthalmology;  Laterality: Left;  Latex sleep apnea   CATARACT EXTRACTION W/PHACO Right 07/01/2019   Procedure: CATARACT EXTRACTION PHACO AND INTRAOCULAR LENS PLACEMENT (IOC) RIGHT 2.38  00:24.4;  Surgeon: Nevada Crane, MD;  Location: [facility] SURGERY CNTR;  Service: Ophthalmology;  Laterality: Right;  Latex Sleep apnea   CHOLECYSTECTOMY     COLONOSCOPY WITH PROPOFOL N/A 04/21/2023   Procedure: COLONOSCOPY WITH PROPOFOL;  Surgeon: Benancio Deeds, MD;  Location: [facility] ENDOSCOPY;  Service: Gastroenterology;  Laterality: N/A;   CORONARY ANGIOPLASTY  2015   Dr. Lady Gary, [facility] Cath Lab   ESOPHAGOGASTRODUODENOSCOPY (EGD) WITH PROPOFOL N/A 12/21/2016   Procedure: ESOPHAGOGASTRODUODENOSCOPY (EGD) WITH PROPOFOL;  Surgeon: Scot Jun, MD;  Location: [facility] ENDOSCOPY;  Service: Endoscopy;  Laterality: N/A;   ESOPHAGOGASTRODUODENOSCOPY (EGD) WITH PROPOFOL N/A 12/08/2022   Procedure: ESOPHAGOGASTRODUODENOSCOPY  (EGD) WITH PROPOFOL;  Surgeon: Jaynie Collins, DO;  Location: [facility] ENDOSCOPY;  Service: Gastroenterology;  Laterality: N/A;   ESOPHAGOGASTRODUODENOSCOPY (EGD) WITH PROPOFOL N/A 04/19/2023   Procedure: ESOPHAGOGASTRODUODENOSCOPY (EGD) WITH PROPOFOL;  Surgeon: Benancio Deeds, MD;  Location: [facility] ENDOSCOPY;  Service: Gastroenterology;  Laterality: N/A;   EYE SURGERY  FRACTURE SURGERY Left 1991   Fractures Femur, [facility]   HEMOSTASIS CLIP PLACEMENT  04/19/2023   Procedure: HEMOSTASIS CLIP PLACEMENT;  Surgeon: Benancio Deeds, MD;  Location: [facility] ENDOSCOPY;  Service: Gastroenterology;;   HIP ARTHROPLASTY Right 05/07/2020   Procedure: ARTHROPLASTY RIGHT HIP (HEMIARTHROPLASTY);  Surgeon: Juanell Fairly, MD;  Location: [facility] ORS;  Service: Orthopedics;  Laterality: Right;   IMAGE GUIDED SINUS SURGERY Bilateral 09/28/2015   Procedure: IMAGE GUIDED SINUS SURGERY, SEPTOPLASTY, BILATERAL FRONTAL SINUSOTOMIES, BILATERAL MAXILLARY ANTROSTOMIES, BILATERAL TOTAL ETHMOIDECTOMY, BILATERAL SPHENOIDECTOMY, BILATERAL INFERIOR TURBINATE REDUCTION;  Surgeon: Vernie Murders, MD;  Location: [facility] ORS;  Service: ENT;  Laterality: Bilateral;   LEFT HEART CATH AND CORONARY ANGIOGRAPHY Left 01/19/2023   Procedure: LEFT HEART CATH AND CORONARY ANGIOGRAPHY;  Surgeon: Marcina Millard, MD;  Location: [facility] INVASIVE CV LAB;  Service: Cardiovascular;  Laterality: Left;   POLYPECTOMY  04/19/2023   Procedure: POLYPECTOMY;  Surgeon: Benancio Deeds, MD;  Location: [facility] ENDOSCOPY;  Service: Gastroenterology;;   POLYPECTOMY  04/21/2023   Procedure: POLYPECTOMY;  Surgeon: Benancio Deeds, MD;  Location: [facility] ENDOSCOPY;  Service: Gastroenterology;;   RECTOCELE REPAIR      Family History  Problem Relation Age of Onset   Hypertension Mother    Cancer Mother    Diabetes Father    Hypertension Father    Heart failure Father    Breast cancer Neg Hx     Allergies  Allergen Reactions   Azithromycin  Diarrhea and Rash    "fainting"  Other reaction(s): Syncope  "fainting"    Other reaction(s): NOT KNOWN  "fainting"   Meloxicam Diarrhea   Morphine Other (See Comments) and Rash    Other reaction(s): Unknown Retain urine Other reaction(s): NOT KNOWN, Other (See Comments) Retain urine Other reaction(s): side effect of severe rash   Adhesive [Tape]     blisters   Codeine Nausea And Vomiting   Lactose Intolerance (Gi) Other (See Comments)    "stomach pain"   Red Dye #40 (Allura Red) Itching    "red food dye"   Shellfish-Derived Products Itching and Swelling   Aspirin Rash   Latex Rash    With extended contact   Lodine [Etodolac] Rash   Morphine And Codeine Hives and Nausea And Vomiting    Other reaction(s): UNKNOWN   Penicillins Rash and Hives    Given on 05/07/20.  No reaction noted.   Pravastatin Rash       Latest Ref Rng & Units 04/20/2023    6:08 AM 04/19/2023    3:22 AM 04/18/2023    4:28 PM  CBC  WBC 4.0 - 10.5 K/uL 7.0  6.2    Hemoglobin 12.0 - 15.0 g/dL 8.6  8.0  7.8   Hematocrit 36.0 - 46.0 % 28.4  26.8  26.3   Platelets 150 - 400 K/uL 266  236        CMP     Component Value Date/Time   NA 134 (L) 04/19/2023 0322   NA 131 (L) 10/18/2017 1441   NA 136 09/13/2012 1157   K 3.6 04/19/2023 0322   K 3.3 (L) 09/13/2012 1157   CL 99 04/19/2023 0322   CL 103 09/13/2012 1157   CO2 24 04/19/2023 0322   CO2 24 09/13/2012 1157   GLUCOSE 92 04/19/2023 0322   GLUCOSE 106 (H) 09/13/2012 1157   BUN 9 04/19/2023 0322   BUN 18 10/18/2017 1441   BUN 10 09/13/2012 1157   CREATININE 0.61 04/19/2023 0322   CREATININE  0.85 09/13/2012 1157   CALCIUM 8.3 (L) 04/19/2023 0322   CALCIUM 8.6 09/13/2012 1157   PROT 5.9 (L) 04/19/2023 0322   PROT 7.2 10/18/2017 1441   PROT 7.3 09/13/2012 1157   ALBUMIN 3.1 (L) 04/19/2023 0322   ALBUMIN 4.1 10/18/2017 1441   ALBUMIN 3.5 09/13/2012 1157   AST 10 (L) 04/19/2023 0322   AST 15 09/13/2012 1157   ALT 12 04/19/2023 0322    ALT 19 09/13/2012 1157   ALKPHOS 52 04/19/2023 0322   ALKPHOS 100 09/13/2012 1157   BILITOT 1.8 (H) 04/19/2023 0322   BILITOT 0.6 10/18/2017 1441   BILITOT 0.6 09/13/2012 1157   GFRNONAA >60 04/19/2023 0322   GFRNONAA >60 11/20/2011 1229     No results found.     Assessment & Plan:   1. History of DVT (deep vein thrombosis) (Primary) I had a discussion with the patient and her daughter regarding the prevention of DVTs.  Unfortunately once a person has had a DVT they are a little bit more likely to get another 1.  The only true way to prevent DVTs is to have the patient on anticoagulation.  However there is an alternative to full anticoagulation instead utilizing 2.5 mg twice a day instead of 5 mg twice per day.  Traditionally Eliquis has less bleeding risks and Xarelto.  And 2.5 mg of Eliquis.  The same bleeding risk of aspirin.  Given her history of multiple blood clots I feel that trying this is a reasonable alternative given that she is also being closely monitored for her hemoglobin.  Will have her return in 6 to 8 weeks in addition to performing DVT studies to see how she is tolerating this.  2. Essential hypertension Continue antihypertensive medications as already ordered, these medications have been reviewed and there are no changes at this time.  3. Symptomatic anemia The patient has been getting her hemoglobin monitor closely and it is still at her new baseline.  We will cautiously introduce her Eliquis back at 2.5 mg while her hemoglobin is being monitored to determine if she can tolerate being on Eliquis   Current Outpatient Medications on File Prior to Visit  Medication Sig Dispense Refill   acetaminophen (TYLENOL) 500 MG tablet Take 500 mg by mouth every 6 (six) hours as needed.     albuterol (PROVENTIL HFA;VENTOLIN HFA) 108 (90 BASE) MCG/ACT inhaler Inhale 2 puffs into the lungs.     amLODipine (NORVASC) 10 MG tablet Take 0.5 tablets (5 mg total) by mouth daily.      carboxymethylcellulose (REFRESH PLUS) 0.5 % SOLN Place 1 drop into both eyes daily as needed (dry eyes).     citalopram (CELEXA) 40 MG tablet Take 40 mg by mouth daily.     clonazePAM (KLONOPIN) 1 MG tablet Take 1 mg by mouth at bedtime.     EPINEPHrine 0.3 mg/0.3 mL IJ SOAJ injection Inject 0.3 mg into the muscle once.     FEROSUL 325 (65 Fe) MG tablet Take 325 mg by mouth daily as needed (when tolerable).     Fluticasone-Salmeterol (ADVAIR HFA IN) Inhale 1 puff into the lungs daily as needed.     gabapentin (NEURONTIN) 600 MG tablet Take 600-1,200 mg by mouth See admin instructions. Take 1 tablet (600mg ) by mouth in the morning and 2 tablets (1200mg ) by mouth at bedtime     levothyroxine (SYNTHROID) 125 MCG tablet Take 1 tablet (125 mcg total) by mouth daily. 30 tablet 1   omeprazole (PRILOSEC)  40 MG capsule Take 1 capsule (40 mg total) by mouth 2 (two) times daily before a meal. 60 capsule 1   ondansetron (ZOFRAN-ODT) 4 MG disintegrating tablet Take 1 tablet (4 mg total) by mouth every 8 (eight) hours as needed for nausea or vomiting. 20 tablet 0   ranitidine (ZANTAC) 150 MG capsule Take 150 mg by mouth 2 (two) times daily.     sucralfate (CARAFATE) 1 GM/10ML suspension Take 10 mLs (1 g total) by mouth 4 (four) times daily -  with meals and at bedtime for 7 days. 280 mL 0   tiZANidine (ZANAFLEX) 4 MG tablet Take 4 mg by mouth at bedtime as needed for muscle spasms. Only uses 1/4 of medication     torsemide (DEMADEX) 20 MG tablet Take by mouth.     trimethoprim (TRIMPEX) 100 MG tablet Take 1 tablet (100 mg total) by mouth daily. 30 tablet 11   apixaban (ELIQUIS) 5 MG TABS tablet Take 1 tablet (5 mg total) by mouth 2 (two) times daily. Then 1 tablet (5mg ) twice daily (Patient not taking: Reported on 06/08/2023)     metoprolol tartrate (LOPRESSOR) 25 MG tablet Take 1 tablet (25 mg total) by mouth daily. (Patient not taking: Reported on 06/08/2023)     [DISCONTINUED] loratadine (CLARITIN) 10 MG  tablet Take 1 tablet (10 mg total) by mouth daily. Take 1 tablet in the morning. As needed for itching. 30 tablet 0   No current facility-administered medications on file prior to visit.    There are no Patient Instructions on file for this visit. No follow-ups on file.   Georgiana Spinner, NP

## 2023-06-13 NOTE — Progress Notes (Unsigned)
06/15/2023 9:51 PM   Stacy Allen [birth year] [id number]  Referring provider: Jodi Marble, NP [address] [place],  [place] [address]  Urological history: 1. Urge incontinence -contributing factors of age, GSM, HTN, sleep apnea, lumbar DDD, anxiety, obesity, benzo's, diuretics and history of smoking  No chief complaint on file.  HPI: Stacy Allen is a 82 y.o. female who presents today for suspected UTI.    Previous records reviewed.   She was seen by Dr. Sherron Monday in November for micro heme, urge incontinence and recurrent UTI's.   He placed her on trimethoprim 100 mg daily to down regulate her bladder.  She had a contrast study in 03/2023 which did not demonstrate a source for the micro heme.  She is schedule for a cysto in January.    UA ***  PVR ***  PMH: Past Medical History:  Diagnosis Date   Adenomatous polyps    Allergic state    Anemia    Anginal pain (HCC)    Anxiety    Arthritis    osteoarthritis   Broken rib    left   Cancer (HCC)    skin   CHF (congestive heart failure) (HCC)    Chicken pox    Complication of anesthesia    respiratory distress after rectocele surgery   Coronary artery disease    Depression    Eczema    Fibromyalgia    GERD (gastroesophageal reflux disease)    Headache    migraines   Hemorrhoids    History of hiatal hernia    Hypercalcemia    Hyperlipidemia    Hypertension    Hypothyroidism    Lumbar stenosis    Obesity    Peptic ulcer disease    Shortness of breath dyspnea    Sleep apnea    No CPAP   Vertigo    Vitamin D deficiency     Surgical History: Past Surgical History:  Procedure Laterality Date   ABDOMINAL HYSTERECTOMY     BACK SURGERY  2007   Dr. Gerrit Heck, [facility], Spinal Fusion   CARDIAC CATHETERIZATION N/A 04/03/2015   Procedure: Left Heart Cath and Coronary Angiography;  Surgeon: Dalia Heading, MD;  Location: [facility] INVASIVE CV LAB;  Service: Cardiovascular;  Laterality: N/A;   CATARACT  EXTRACTION W/PHACO Left 04/22/2019   Procedure: CATARACT EXTRACTION PHACO AND INTRAOCULAR LENS PLACEMENT (IOC) LEFT  00:45.4  12.9%  6.03;  Surgeon: Nevada Crane, MD;  Location: [facility];  Service: Ophthalmology;  Laterality: Left;  Latex sleep apnea   CATARACT EXTRACTION W/PHACO Right 07/01/2019   Procedure: CATARACT EXTRACTION PHACO AND INTRAOCULAR LENS PLACEMENT (IOC) RIGHT 2.38  00:24.4;  Surgeon: Nevada Crane, MD;  Location: [facility];  Service: Ophthalmology;  Laterality: Right;  Latex Sleep apnea   CHOLECYSTECTOMY     COLONOSCOPY WITH PROPOFOL N/A 04/21/2023   Procedure: COLONOSCOPY WITH PROPOFOL;  Surgeon: Benancio Deeds, MD;  Location: [facility] ENDOSCOPY;  Service: Gastroenterology;  Laterality: N/A;   CORONARY ANGIOPLASTY  2015   Dr. Lady Gary, [facility]   ESOPHAGOGASTRODUODENOSCOPY (EGD) WITH PROPOFOL N/A 12/21/2016   Procedure: ESOPHAGOGASTRODUODENOSCOPY (EGD) WITH PROPOFOL;  Surgeon: Scot Jun, MD;  Location: [facility] ENDOSCOPY;  Service: Endoscopy;  Laterality: N/A;   ESOPHAGOGASTRODUODENOSCOPY (EGD) WITH PROPOFOL N/A 12/08/2022   Procedure: ESOPHAGOGASTRODUODENOSCOPY (EGD) WITH PROPOFOL;  Surgeon: Jaynie Collins, DO;  Location: [facility] ENDOSCOPY;  Service: Gastroenterology;  Laterality: N/A;   ESOPHAGOGASTRODUODENOSCOPY (EGD) WITH PROPOFOL N/A 04/19/2023   Procedure: ESOPHAGOGASTRODUODENOSCOPY (  EGD) WITH PROPOFOL;  Surgeon: Benancio Deeds, MD;  Location: [facility] ENDOSCOPY;  Service: Gastroenterology;  Laterality: N/A;   EYE SURGERY     FRACTURE SURGERY Left 1991   Fractures Femur, [facility]   HEMOSTASIS CLIP PLACEMENT  04/19/2023   Procedure: HEMOSTASIS CLIP PLACEMENT;  Surgeon: Benancio Deeds, MD;  Location: [facility] ENDOSCOPY;  Service: Gastroenterology;;   HIP ARTHROPLASTY Right 05/07/2020   Procedure: ARTHROPLASTY RIGHT HIP (HEMIARTHROPLASTY);  Surgeon: Juanell Fairly, MD;  Location: [facility] ORS;  Service: Orthopedics;   Laterality: Right;   IMAGE GUIDED SINUS SURGERY Bilateral 09/28/2015   Procedure: IMAGE GUIDED SINUS SURGERY, SEPTOPLASTY, BILATERAL FRONTAL SINUSOTOMIES, BILATERAL MAXILLARY ANTROSTOMIES, BILATERAL TOTAL ETHMOIDECTOMY, BILATERAL SPHENOIDECTOMY, BILATERAL INFERIOR TURBINATE REDUCTION;  Surgeon: Vernie Murders, MD;  Location: [facility] ORS;  Service: ENT;  Laterality: Bilateral;   LEFT HEART CATH AND CORONARY ANGIOGRAPHY Left 01/19/2023   Procedure: LEFT HEART CATH AND CORONARY ANGIOGRAPHY;  Surgeon: Marcina Millard, MD;  Location: [facility] INVASIVE CV LAB;  Service: Cardiovascular;  Laterality: Left;   POLYPECTOMY  04/19/2023   Procedure: POLYPECTOMY;  Surgeon: Benancio Deeds, MD;  Location: [facility] ENDOSCOPY;  Service: Gastroenterology;;   POLYPECTOMY  04/21/2023   Procedure: POLYPECTOMY;  Surgeon: Benancio Deeds, MD;  Location: [facility] ENDOSCOPY;  Service: Gastroenterology;;   RECTOCELE REPAIR      Home Medications:  Allergies as of 06/15/2023       Reactions   Azithromycin Diarrhea, Rash   "fainting" Other reaction(s): Syncope  "fainting"    Other reaction(s): NOT KNOWN  "fainting"   Meloxicam Diarrhea   Morphine Other (See Comments), Rash   Other reaction(s): Unknown Retain urine Other reaction(s): NOT KNOWN, Other (See Comments) Retain urine Other reaction(s): side effect of severe rash   Adhesive [tape]    blisters   Codeine Nausea And Vomiting   Lactose Intolerance (gi) Other (See Comments)   "stomach pain"   Red Dye #40 (allura Red) Itching   "red food dye"   Shellfish-derived Products Itching, Swelling   Aspirin Rash   Latex Rash   With extended contact   Lodine [etodolac] Rash   Morphine And Codeine Hives, Nausea And Vomiting   Other reaction(s): UNKNOWN   Penicillins Rash, Hives   Given on 05/07/20.  No reaction noted.   Pravastatin Rash        Medication List        Accurate as of June 13, 2023  9:51 PM. If you have any questions, ask your nurse or  doctor.          acetaminophen 500 MG tablet Commonly known as: TYLENOL Take 500 mg by mouth every 6 (six) hours as needed.   ADVAIR HFA IN Inhale 1 puff into the lungs daily as needed.   albuterol 108 (90 Base) MCG/ACT inhaler Commonly known as: VENTOLIN HFA Inhale 2 puffs into the lungs.   amLODipine 10 MG tablet Commonly known as: NORVASC Take 0.5 tablets (5 mg total) by mouth daily.   apixaban 5 MG Tabs tablet Commonly known as: Eliquis Take 1 tablet (5 mg total) by mouth 2 (two) times daily. Then 1 tablet (5mg ) twice daily   carboxymethylcellulose 0.5 % Soln Commonly known as: REFRESH PLUS Place 1 drop into both eyes daily as needed (dry eyes).   citalopram 40 MG tablet Commonly known as: CELEXA Take 40 mg by mouth daily.   clonazePAM 1 MG tablet Commonly known as: KLONOPIN Take 1 mg by mouth at bedtime.   EPINEPHrine 0.3 mg/0.3 mL Soaj injection  Commonly known as: EPI-PEN Inject 0.3 mg into the muscle once.   FeroSul 325 (65 Fe) MG tablet Generic drug: ferrous sulfate Take 325 mg by mouth daily as needed (when tolerable).   gabapentin 600 MG tablet Commonly known as: NEURONTIN Take 600-1,200 mg by mouth See admin instructions. Take 1 tablet (600mg ) by mouth in the morning and 2 tablets (1200mg ) by mouth at bedtime   levothyroxine 125 MCG tablet Commonly known as: SYNTHROID Take 1 tablet (125 mcg total) by mouth daily.   metoprolol tartrate 25 MG tablet Commonly known as: LOPRESSOR Take 1 tablet (25 mg total) by mouth daily.   omeprazole 40 MG capsule Commonly known as: PRILOSEC Take 1 capsule (40 mg total) by mouth 2 (two) times daily before a meal.   ondansetron 4 MG disintegrating tablet Commonly known as: ZOFRAN-ODT Take 1 tablet (4 mg total) by mouth every 8 (eight) hours as needed for nausea or vomiting.   ranitidine 150 MG capsule Commonly known as: ZANTAC Take 150 mg by mouth 2 (two) times daily.   sucralfate 1 GM/10ML  suspension Commonly known as: CARAFATE Take 10 mLs (1 g total) by mouth 4 (four) times daily -  with meals and at bedtime for 7 days.   tiZANidine 4 MG tablet Commonly known as: ZANAFLEX Take 4 mg by mouth at bedtime as needed for muscle spasms. Only uses 1/4 of medication   torsemide 20 MG tablet Commonly known as: DEMADEX Take by mouth.   trimethoprim 100 MG tablet Commonly known as: TRIMPEX Take 1 tablet (100 mg total) by mouth daily.        Allergies:  Allergies  Allergen Reactions   Azithromycin Diarrhea and Rash    "fainting"  Other reaction(s): Syncope  "fainting"    Other reaction(s): NOT KNOWN  "fainting"   Meloxicam Diarrhea   Morphine Other (See Comments) and Rash    Other reaction(s): Unknown Retain urine Other reaction(s): NOT KNOWN, Other (See Comments) Retain urine Other reaction(s): side effect of severe rash   Adhesive [Tape]     blisters   Codeine Nausea And Vomiting   Lactose Intolerance (Gi) Other (See Comments)    "stomach pain"   Red Dye #40 (Allura Red) Itching    "red food dye"   Shellfish-Derived Products Itching and Swelling   Aspirin Rash   Latex Rash    With extended contact   Lodine [Etodolac] Rash   Morphine And Codeine Hives and Nausea And Vomiting    Other reaction(s): UNKNOWN   Penicillins Rash and Hives    Given on 05/07/20.  No reaction noted.   Pravastatin Rash    Family History: Family History  Problem Relation Age of Onset   Hypertension Mother    Cancer Mother    Diabetes Father    Hypertension Father    Heart failure Father    Breast cancer Neg Hx     Social History:  reports that she quit smoking about 24 years ago. Her smoking use included cigarettes. She has been exposed to tobacco smoke. She has never used smokeless tobacco. She reports that she does not drink alcohol and does not use drugs.  ROS: Pertinent ROS in HPI  Physical Exam: There were no vitals taken for this visit.  Constitutional:  Well  nourished. Alert and oriented, No acute distress. HEENT: [place] AT, moist mucus membranes.  Trachea midline, no masses. Cardiovascular: No clubbing, cyanosis, or edema. Respiratory: Normal respiratory effort, no increased work of breathing. GU: No CVA  tenderness.  No bladder fullness or masses.  Recession of labia minora, dry, pale vulvar vaginal mucosa and loss of mucosal ridges and folds.  Normal urethral meatus, no lesions, no prolapse, no discharge.   No urethral masses, tenderness and/or tenderness. No bladder fullness, tenderness or masses. *** vagina mucosa, *** estrogen effect, no discharge, no lesions, *** pelvic support, *** cystocele and *** rectocele noted.  No cervical motion tenderness.  Uterus is freely mobile and non-fixed.  No adnexal/parametria masses or tenderness noted.  Anus and perineum are without rashes or lesions.   ***  Neurologic: Grossly intact, no focal deficits, moving all 4 extremities. Psychiatric: Normal mood and affect.    Laboratory Data: CBC w/auto Differential (5 Part) Order: [id number] Component Ref Range & Units 8 d ago  WBC (White Blood Cell Count) 4.1 - 10.2 10^3/uL 12.1 High   RBC (Red Blood Cell Count) 4.04 - 5.48 10^6/uL 3.59 Low   Hemoglobin 12.0 - 15.0 gm/dL 8.7 Low   Hematocrit 34.7 - 47.0 % 29.7 Low   MCV (Mean Corpuscular Volume) 80.0 - 100.0 fl 82.7  MCH (Mean Corpuscular Hemoglobin) 27.0 - 31.2 pg 24.2 Low   MCHC (Mean Corpuscular Hemoglobin Concentration) 32.0 - 36.0 gm/dL 42.5 Low   Platelet Count 150 - 450 10^3/uL 413  RDW-CV (Red Cell Distribution Width) 11.6 - 14.8 % 20.1 High   MPV (Mean Platelet Volume) 9.4 - 12.4 fl 9.3 Low   Neutrophils 1.50 - 7.80 10^3/uL 9.23 High   Lymphocytes 1.00 - 3.60 10^3/uL 1.45  Monocytes 0.00 - 1.50 10^3/uL 1.26  Eosinophils 0.00 - 0.55 10^3/uL 0.04  Basophils 0.00 - 0.09 10^3/uL 0.03  Neutrophil % 32.0 - 70.0 % 76.7 High   Lymphocyte % 10.0 - 50.0 % 12  Monocyte % 4.0 - 13.0 % 10.5   Eosinophil % 1.0 - 5.0 % 0.3 Low   Basophil% 0.0 - 2.0 % 0.2  Immature Granulocyte % <=0.7 % 0.3  Immature Granulocyte Count <=0.06 10^3/L 0.04  Resulting Agency [facility]   Specimen Collected: 06/05/23 12:45   Performed by: [facility] Last Resulted: 06/05/23 14:44  Received From: [facility]  Result Received: 06/08/23 13:21    Lab Results  Component Value Date   CREATININE 0.61 04/19/2023    Lab Results  Component Value Date   TSH 0.222 (L) 04/17/2023    Lab Results  Component Value Date   AST 10 (L) 04/19/2023   Lab Results  Component Value Date   ALT 12 04/19/2023    Urinalysis See EPIC and HPI  I have reviewed the labs.   Pertinent Imaging: ***  Assessment & Plan:    1. Suspected UTI -UA grossly infected  -Urine culture pending -Started empirically on ***, will adjust if necessary once urine culture and sensitivity results are available  2. High risk hematuria -former smoker -contrast CT (03/2023) - NED -cysto pending -no reports of gross heme -UA ***   No follow-ups on file.  These notes generated with voice recognition software. I apologize for typographical errors.  Cloretta Ned  [facility], [facility] [address]  [address] [place], [place] [address] [phone]

## 2023-06-15 ENCOUNTER — Ambulatory Visit: Payer: Medicare HMO | Admitting: Urology

## 2023-06-15 ENCOUNTER — Encounter: Payer: Self-pay | Admitting: Urology

## 2023-06-15 VITALS — BP 141/83 | HR 106 | Ht 63.0 in | Wt 231.0 lb

## 2023-06-15 DIAGNOSIS — R3989 Other symptoms and signs involving the genitourinary system: Secondary | ICD-10-CM

## 2023-06-15 DIAGNOSIS — R319 Hematuria, unspecified: Secondary | ICD-10-CM

## 2023-06-15 DIAGNOSIS — Z87898 Personal history of other specified conditions: Secondary | ICD-10-CM

## 2023-06-15 LAB — URINALYSIS, COMPLETE
Bilirubin, UA: NEGATIVE
Glucose, UA: NEGATIVE
Ketones, UA: NEGATIVE
Nitrite, UA: POSITIVE — AB
Protein,UA: NEGATIVE
RBC, UA: NEGATIVE
Specific Gravity, UA: 1.02 (ref 1.005–1.030)
Urobilinogen, Ur: 0.2 mg/dL (ref 0.2–1.0)
pH, UA: 6 (ref 5.0–7.5)

## 2023-06-15 LAB — BLADDER SCAN AMB NON-IMAGING

## 2023-06-15 LAB — MICROSCOPIC EXAMINATION: WBC, UA: >30 /[HPF] — AB (ref 0–5)

## 2023-06-15 MED ORDER — CEFUROXIME AXETIL 250 MG PO TABS: 250.0000 mg | ORAL_TABLET | Freq: Two times a day (BID) | ORAL | 0 refills | Status: DC

## 2023-06-19 ENCOUNTER — Ambulatory Visit: Payer: Medicare HMO | Admitting: Clinical

## 2023-06-19 DIAGNOSIS — F419 Anxiety disorder, unspecified: Secondary | ICD-10-CM | POA: Diagnosis not present

## 2023-06-19 DIAGNOSIS — F330 Major depressive disorder, recurrent, mild: Secondary | ICD-10-CM

## 2023-06-19 LAB — CULTURE, URINE COMPREHENSIVE

## 2023-06-19 NOTE — Progress Notes (Signed)
[facility] Counselor/Therapist Progress Note  Patient ID: Stacy Allen, MRN: [id number],    Date: 06/19/2023  Time Spent: 12:31pm - 1:23pm : 52 minutes   Treatment Type: Individual Therapy  Reported Symptoms: Patient reported weakness, depressed mood yesterday  Mental Status Exam: Appearance:  Could not assess      Behavior: Could not assess  Motor: Could not assess  Speech/Language:  Clear and Coherent  Affect: Could not assess  Mood: normal  Thought process: normal  Thought content:   WNL  Sensory/Perceptual disturbances:   WNL  Orientation: oriented to person, place, and situation  Attention: Good  Concentration: Good  Memory: WNL  Fund of knowledge:  Good  Insight:   Good  Judgment:  Good  Impulse Control: Good   Risk Assessment: Danger to Self:  No Patient denied current suicidal ideation  Self-injurious Behavior: No Danger to Others: No Patient denied current homicidal ideation Duty to Warn:no Physical Aggression / Violence:No  Access to Firearms a concern: No  Gang Involvement:No   Subjective: Patient reported a tree recently fell on the bedroom of patient's camper while patient was in the kitchen. Patient reported she is currently staying in her niece and nephew's home while patient's camper is being replaced. Patient reported she has not been feeling well and reported she missed her last two therapy sessions due to illness. Patient reported she has been feeling weak. Patient reported she is having lab work every two weeks to check patient's hemoglobin levels. Patient reported she feels weak and heart starts racing when patient walks. Patient reported she experienced difficulty getting out of bed yesterday due to weakness. Patient reported she is scheduled to have lab work today. Patient reported she was hospitalized for five days recently due to low hemoglobin levels. Patient reported she is scheduled to have a urology procedure in 2025. Patient  stated, "its gangs up on me" in reference to health concerns. Patient reported spending time with patient's great great niece has been beneficial to patient's mood. Patient reported she obtained several apartment applications prior to patient's niece/nephew purchasing patient a new camper. Patient reported patient's new camper is larger and patient stated, "I'm excited" in reference to new camper. Patient reported niece/nephew are having trees removed prior to patient moving into the new camper. Patient reported her sister in law passed away on 06-27-2023. Patient stated yesterday she was experiencing the thought, "lord would you please take me". Patient stated yesterday she was experiencing the thought, "I was just saying to him why are you waiting, what am I doing here, what is it that I need to get done" in reference to patient's thoughts yesterday. Patient reported experiencing the thought, "people love me and would be hurt if something happened to me at Christmas". Patient stated, "I was very depressed" in reference to patient's mood yesterday. Patient stated, "It is up to him (in reference to patient's faith), it is not up to me" in reference to patient's thoughts yesterday. Patient stated, "God", patient's faith, and stated "the household here too" in response to protective factors. Patient stated, "its much better" in response to patient's current mood. Patient stated, "yes, because circumstances" in reference to the need to continue to work towards patient's goals.   Interventions: Cognitive Behavioral Therapy and supportive therapy.  Clinician conducted session via telephone from clinician's home office at patient's request. Patient reported she experiences difficulty utilizing the video component on her phone. Patient provided verbal consent to proceed with telehealth session and  is aware of limitations of telephone or video visits. Patient participated in session from patient's home. Reviewed events  since last session. Provided supportive therapy, active listening, and validation as patient discussed loss of camper, recent health concerns, sister in law's passing, and patient's thoughts/feelings in response. Assessed for SI/HI, clarified patient's statements/thoughts patient experienced yesterday, and assessed for safety. Explored and identified protective factors that maintain patient's safety. Assessed patient's current mood. Reviewed patient's goals and patient's progress.    Collaboration of Care: not required at this time   Diagnosis:  Mild episode of recurrent major depressive disorder (HCC)   Anxiety disorder, unspecified type     Plan: Patient is to utilize [employer] [facility], thought re-framing, mindfulness and coping strategies to decrease symptoms associated with their diagnosis. Frequency: bi-weekly  Modality: individual      Long-term goal:   Patient stated, "I guess I want to see my being able to recognize and appreciate what I have instead of focusing on all I've lost".  Target Date: 12/01/23  Progress: progressing    Short-term goal:  Write at least one positive affirmation about herself and/or patient's environment each day Target Date: 12/01/22  Progress: progressing    Practice gratitude exercises to decrease symptoms of depression Target Date: 12/01/22  Progress: progressing    Identify, challenge, and replace negative thought patterns that contribute to feelings of depression and anxiety with positive thoughts and beliefs per patient's report Target Date: 12/01/22  Progress: progressing    Develop and implement strategies to increase self care, such as, participating in water exercise class, accepting invitations to social functions   Target Date: 12/01/22  Progress: progressing    verbalize positive statements regarding self and her ability to cope with life stressors     Target Date: 12/01/22  Progress: progressing    Doree Barthel, LCSW

## 2023-06-19 NOTE — Progress Notes (Signed)
                Dezi Schaner, LCSW 

## 2023-06-20 ENCOUNTER — Other Ambulatory Visit: Payer: Self-pay

## 2023-06-20 ENCOUNTER — Observation Stay: Payer: Medicare HMO

## 2023-06-20 ENCOUNTER — Observation Stay
Admission: EM | Admit: 2023-06-20 | Discharge: 2023-06-23 | Disposition: A | Discharge status: Home / Self Care | Payer: Medicare HMO | Attending: Emergency Medicine | Admitting: Emergency Medicine

## 2023-06-20 DIAGNOSIS — Z96641 Presence of right artificial hip joint: Secondary | ICD-10-CM | POA: Diagnosis not present

## 2023-06-20 DIAGNOSIS — I110 Hypertensive heart disease with heart failure: Secondary | ICD-10-CM | POA: Diagnosis not present

## 2023-06-20 DIAGNOSIS — D649 Anemia, unspecified: Principal | ICD-10-CM | POA: Insufficient documentation

## 2023-06-20 DIAGNOSIS — D62 Acute posthemorrhagic anemia: Secondary | ICD-10-CM | POA: Diagnosis not present

## 2023-06-20 DIAGNOSIS — Z87891 Personal history of nicotine dependence: Secondary | ICD-10-CM | POA: Diagnosis not present

## 2023-06-20 DIAGNOSIS — E039 Hypothyroidism, unspecified: Secondary | ICD-10-CM | POA: Insufficient documentation

## 2023-06-20 DIAGNOSIS — Z91040 Latex allergy status: Secondary | ICD-10-CM | POA: Diagnosis not present

## 2023-06-20 DIAGNOSIS — K922 Gastrointestinal hemorrhage, unspecified: Secondary | ICD-10-CM | POA: Diagnosis not present

## 2023-06-20 DIAGNOSIS — Z85828 Personal history of other malignant neoplasm of skin: Secondary | ICD-10-CM | POA: Insufficient documentation

## 2023-06-20 DIAGNOSIS — Z7901 Long term (current) use of anticoagulants: Secondary | ICD-10-CM | POA: Diagnosis not present

## 2023-06-20 DIAGNOSIS — Z79899 Other long term (current) drug therapy: Secondary | ICD-10-CM | POA: Insufficient documentation

## 2023-06-20 DIAGNOSIS — I509 Heart failure, unspecified: Secondary | ICD-10-CM | POA: Insufficient documentation

## 2023-06-20 DIAGNOSIS — I2510 Atherosclerotic heart disease of native coronary artery without angina pectoris: Secondary | ICD-10-CM | POA: Diagnosis not present

## 2023-06-20 DIAGNOSIS — Z86718 Personal history of other venous thrombosis and embolism: Secondary | ICD-10-CM | POA: Insufficient documentation

## 2023-06-20 LAB — CBC
HCT: 22.8 % — ABNORMAL LOW (ref 36.0–46.0)
Hemoglobin: 6.6 g/dL — ABNORMAL LOW (ref 12.0–15.0)
MCH: 24.1 pg — ABNORMAL LOW (ref 26.0–34.0)
MCHC: 28.9 g/dL — ABNORMAL LOW (ref 30.0–36.0)
MCV: 83.2 fL (ref 80.0–100.0)
Platelets: 305 10*3/uL (ref 150–400)
RBC: 2.74 MIL/uL — ABNORMAL LOW (ref 3.87–5.11)
RDW: 20.2 % — ABNORMAL HIGH (ref 11.5–15.5)
WBC: 6.7 10*3/uL (ref 4.0–10.5)
nRBC: 0 % (ref 0.0–0.2)

## 2023-06-20 LAB — COMPREHENSIVE METABOLIC PANEL
ALT: 11 U/L (ref 0–44)
AST: 16 U/L (ref 15–41)
Albumin: 3.4 g/dL — ABNORMAL LOW (ref 3.5–5.0)
Alkaline Phosphatase: 66 U/L (ref 38–126)
Anion gap: 7 (ref 5–15)
BUN: 19 mg/dL (ref 8–23)
CO2: 27 mmol/L (ref 22–32)
Calcium: 8.3 mg/dL — ABNORMAL LOW (ref 8.9–10.3)
Chloride: 100 mmol/L (ref 98–111)
Creatinine, Ser: 0.76 mg/dL (ref 0.44–1.00)
GFR, Estimated: >60 mL/min (ref >60)
Glucose, Bld: 109 mg/dL — ABNORMAL HIGH (ref 70–99)
Potassium: 3.9 mmol/L (ref 3.5–5.1)
Sodium: 134 mmol/L — ABNORMAL LOW (ref 135–145)
Total Bilirubin: 0.6 mg/dL (ref <1.2)
Total Protein: 6.6 g/dL (ref 6.5–8.1)

## 2023-06-20 LAB — PROTIME-INR
INR: 1.1 (ref 0.8–1.2)
Prothrombin Time: 14.7 s (ref 11.4–15.2)

## 2023-06-20 LAB — PREPARE RBC (CROSSMATCH)

## 2023-06-20 MED ORDER — BISACODYL 10 MG RE SUPP: 10.0000 mg | Freq: Every day | RECTAL | Status: DC | PRN

## 2023-06-20 MED ORDER — HYDRALAZINE HCL 20 MG/ML IJ SOLN: 5.0000 mg | INTRAMUSCULAR | Status: DC | PRN

## 2023-06-20 MED ORDER — GABAPENTIN 400 MG PO CAPS
1200.0000 mg | ORAL_CAPSULE | Freq: Every day | ORAL | Status: DC
  Administered 2023-06-20 – 2023-06-22 (×3): 1200 mg via ORAL
  Filled 2023-06-20 (×2): qty 3
  Filled 2023-06-20: qty 4

## 2023-06-20 MED ORDER — OXYCODONE HCL 5 MG PO TABS
5.0000 mg | ORAL_TABLET | ORAL | Status: DC | PRN
  Administered 2023-06-21: 5 mg via ORAL
  Filled 2023-06-20: qty 1

## 2023-06-20 MED ORDER — PANTOPRAZOLE SODIUM 40 MG IV SOLR
40.0000 mg | Freq: Two times a day (BID) | INTRAVENOUS | Status: DC
  Administered 2023-06-20 – 2023-06-23 (×6): 40 mg via INTRAVENOUS
  Filled 2023-06-20 (×6): qty 10

## 2023-06-20 MED ORDER — GABAPENTIN 300 MG PO CAPS
600.0000 mg | ORAL_CAPSULE | Freq: Every day | ORAL | Status: DC
Start: 2023-06-21 — End: 2023-06-23
  Administered 2023-06-21 – 2023-06-23 (×3): 600 mg via ORAL
  Filled 2023-06-20 (×3): qty 2

## 2023-06-20 MED ORDER — ONDANSETRON HCL 4 MG/2ML IJ SOLN: 4.0000 mg | Freq: Four times a day (QID) | INTRAMUSCULAR | Status: DC | PRN

## 2023-06-20 MED ORDER — PANTOPRAZOLE SODIUM 40 MG IV SOLR
40.0000 mg | Freq: Once | INTRAVENOUS | Status: AC
  Administered 2023-06-20: 40 mg via INTRAVENOUS
  Filled 2023-06-20: qty 10

## 2023-06-20 MED ORDER — CITALOPRAM HYDROBROMIDE 20 MG PO TABS
20.0000 mg | ORAL_TABLET | Freq: Every day | ORAL | Status: DC
  Administered 2023-06-21 – 2023-06-23 (×3): 20 mg via ORAL
  Filled 2023-06-20 (×3): qty 1

## 2023-06-20 MED ORDER — ACETAMINOPHEN 650 MG RE SUPP: 650.0000 mg | Freq: Four times a day (QID) | RECTAL | Status: DC | PRN

## 2023-06-20 MED ORDER — ACETAMINOPHEN 325 MG PO TABS
650.0000 mg | ORAL_TABLET | Freq: Four times a day (QID) | ORAL | Status: DC | PRN
  Administered 2023-06-20 – 2023-06-21 (×2): 650 mg via ORAL
  Filled 2023-06-20 (×2): qty 2

## 2023-06-20 MED ORDER — ONDANSETRON HCL 4 MG PO TABS
4.0000 mg | ORAL_TABLET | Freq: Four times a day (QID) | ORAL | Status: DC | PRN
Start: 2023-06-20 — End: 2023-06-23

## 2023-06-20 MED ORDER — CLONAZEPAM 1 MG PO TABS
1.0000 mg | ORAL_TABLET | Freq: Every day | ORAL | Status: DC
  Administered 2023-06-20 – 2023-06-22 (×3): 1 mg via ORAL
  Filled 2023-06-20: qty 2
  Filled 2023-06-20 (×2): qty 1

## 2023-06-20 MED ORDER — POLYETHYLENE GLYCOL 3350 17 G PO PACK: 17.0000 g | PACK | Freq: Every day | ORAL | Status: DC | PRN

## 2023-06-20 MED ORDER — TIZANIDINE HCL 4 MG PO TABS
4.0000 mg | ORAL_TABLET | Freq: Every evening | ORAL | Status: DC | PRN
  Administered 2023-06-20: 4 mg via ORAL
  Filled 2023-06-20: qty 2

## 2023-06-20 MED ORDER — LEVOTHYROXINE SODIUM 50 MCG PO TABS
150.0000 ug | ORAL_TABLET | Freq: Every day | ORAL | Status: DC
  Administered 2023-06-21 – 2023-06-23 (×3): 150 ug via ORAL
  Filled 2023-06-20 (×3): qty 1

## 2023-06-20 MED ORDER — CEFDINIR 300 MG PO CAPS
300.0000 mg | ORAL_CAPSULE | Freq: Two times a day (BID) | ORAL | Status: DC
Start: 2023-06-20 — End: 2023-06-23
  Administered 2023-06-20 – 2023-06-23 (×6): 300 mg via ORAL
  Filled 2023-06-20 (×7): qty 1

## 2023-06-20 MED ORDER — SODIUM CHLORIDE 0.9 % IV SOLN
10.0000 mL/h | Freq: Once | INTRAVENOUS | Status: DC
Start: 2023-06-20 — End: 2023-06-20

## 2023-06-20 NOTE — H&P (Addendum)
HISTORY AND PHYSICAL    Stacy Allen   ZOX:[id number] DOB: 02/15/1941   Date of Service: 06/20/23 Requesting physician/APP from ED: Treatment Team:  Attending Provider: Sunnie Nielsen, DO  PCP: Jodi Marble, NP   HPI: Stacy Allen is a 82 y.o. 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 Presents to ED 06/20/23 for concerns abnormal labs (anemia) and fatigue. Of note, was admitted to hospital for GI bleed 03/2023. EGD showed large hiatal hernia, benign-appearing gastric polyp-no obvious bleeding source. Colonoscopy showed cecal polyp, internal hemorrhoids, no foci of bleeding. Discharged home. Was off Eliquis following hospitalization. Recently restarted on Eliquis lower dose 2.5 mg bid by vascular surgery 06/08/23 given concern for hx recurrent DVT. Pt was taking this, noted darker stool, stopped the Eliquis 06/18/23 (2 days prior to presentation to ED - last dose she thinks Sat night or Sun morning)    Hospital course / significant events:  12/24: to ED, Hgb 6.6. 2 units PRBC ordered by EDP. Admitted to hospitalist service.   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 but complicated by GIB, large hiatal hernia, GERD Chronic iron deficiency anemia Note last dose eliquis about 2 days ago she thinks AM 12/22 Protonix IV 40 mg bid  CLD GI consult No anticoagulation PRBC as ordered Monitor HH Pt reports would be ok w/ EGD if advised, would NOT want to do another colonoscopy   Hx recurrent DVT  Pt reports "I have the clot now" but no recent imaging, reports pain in RLE [place] bilateral LE If (+)DVT will certainly need to consult vascular team   CAD s/p PCI HTN Statin intolerance, ASA intolerance  Amlodipine at home - holding   Not on BB, ACE/ARB at home Hydralazine prn HTN   UTI - recent 06/15/2023 Ecoli pansensitive  Cefuroxime started 12/19 Continue omnicef (cefuroxime not on formulary)  Hypothyroid Continue home levothyroxine 150 mcg dose confirmed w/ patient  Anxiety/depression Continue Celexa, clonazepam  Neuropathic pain Continue home gabapentin   Class 3 based on BMI: Body mass index is 40.92 kg/m.  Underweight - under 18  overweight - 25 to 29 obese - 30 or more Class 1 obesity: BMI of 30.0 to 34 Class 2 obesity: BMI of 35.0 to 39 Class 3 obesity: BMI of 40.0 to 49 Super Morbid Obesity: BMI 50-59 Super-super Morbid Obesity: BMI 60+ Significantly low or high BMI is associated with higher medical risk.  Weight management advised as adjunct to other disease management and risk reduction treatments    DVT prophylaxis: SCD IV fluids: no continuous IV fluids  Nutrition: CLD Central lines / invasive devices: none  Code Status: DNR confirmed w/ patient on admission by Dr Lyn Hollingshead  ACP documentation reviewed: DNR form is on file in [facility]  TOC needs: none Barriers to dispo / significant pending items: GI bleed, blood transfusion, possible endoscopy             Review of Systems:  Review of Systems  Constitutional:  Positive for malaise/fatigue. Negative for chills, diaphoresis and fever.  Cardiovascular:  Negative for chest pain, claudication and leg swelling (reports mild swelling chronically, states "I have blood clots now" in RLE, reports burning sensation).  Gastrointestinal:  Positive for melena. Negative for abdominal pain, constipation, diarrhea, heartburn, nausea and vomiting.  Genitourinary:  Positive for frequency and urgency. Negative for hematuria.  Musculoskeletal:  Positive for joint pain (chronic). Negative for back pain, falls, myalgias and neck pain.  Skin:  Negative for rash.  Neurological:  Negative for dizziness, tingling, focal weakness, seizures and  headaches.  Endo/Heme/Allergies:  Bruises/bleeds easily.  Psychiatric/Behavioral:  Negative for depression. The patient is not nervous/anxious.        has a past medical history of Adenomatous polyps, Allergic state, Anemia, Anginal pain (HCC), Anxiety, Arthritis, Broken rib, Cancer (HCC), CHF (congestive heart failure) (HCC), Chicken pox, Complication of anesthesia, Coronary artery disease, Depression, Eczema, Fibromyalgia, GERD (gastroesophageal reflux disease), Headache, Hemorrhoids, History of hiatal hernia, Hypercalcemia, Hyperlipidemia, Hypertension, Hypothyroidism, Lumbar stenosis, Obesity, Peptic ulcer disease, Shortness of breath dyspnea, Sleep apnea, Vertigo, and Vitamin D deficiency. (Not in an outpatient encounter)   Allergies  Allergen Reactions   Azithromycin Diarrhea and Rash    "fainting"  Other reaction(s): Syncope  "fainting"    Other reaction(s): NOT KNOWN  "fainting"   Meloxicam Diarrhea   Morphine Other (See Comments) and Rash    Other reaction(s): Unknown Retain urine Other reaction(s): NOT KNOWN, Other (See Comments) Retain urine Other reaction(s): side effect of severe rash   Adhesive [Tape]     blisters   Codeine Nausea And Vomiting   Lactose Intolerance (Gi) Other (See Comments)    "stomach pain"   Red Dye #40 (Allura Red) Itching    "red food dye"   Shellfish-Derived Products Itching and Swelling   Aspirin Rash   Latex Rash    With extended contact   Lodine [Etodolac] Rash   Morphine And Codeine Hives and Nausea And Vomiting    Other reaction(s): UNKNOWN   Penicillins Rash and Hives    Given on 05/07/20.  No reaction noted.   Pravastatin Rash      family history includes Cancer in her mother; Diabetes in her father; Heart failure in her father; Hypertension in her father and mother. Past Surgical History:  Procedure Laterality Date   ABDOMINAL HYSTERECTOMY     BACK SURGERY  2007   Dr. Gerrit Heck, [facility], Spinal Fusion   CARDIAC  CATHETERIZATION N/A 04/03/2015   Procedure: Left Heart Cath and Coronary Angiography;  Surgeon: Dalia Heading, MD;  Location: [facility] INVASIVE CV LAB;  Service: Cardiovascular;  Laterality: N/A;   CATARACT EXTRACTION W/PHACO Left 04/22/2019   Procedure: CATARACT EXTRACTION PHACO AND INTRAOCULAR LENS PLACEMENT (IOC) LEFT  00:45.4  12.9%  6.03;  Surgeon: Nevada Crane, MD;  Location: [facility] SURGERY CNTR;  Service: Ophthalmology;  Laterality: Left;  Latex sleep apnea   CATARACT EXTRACTION W/PHACO Right 07/01/2019   Procedure: CATARACT EXTRACTION PHACO AND INTRAOCULAR LENS PLACEMENT (IOC) RIGHT 2.38  00:24.4;  Surgeon: Nevada Crane, MD;  Location: [facility] SURGERY CNTR;  Service: Ophthalmology;  Laterality: Right;  Latex Sleep apnea   CHOLECYSTECTOMY     COLONOSCOPY WITH PROPOFOL N/A 04/21/2023   Procedure: COLONOSCOPY WITH PROPOFOL;  Surgeon: Benancio Deeds, MD;  Location: [facility] ENDOSCOPY;  Service: Gastroenterology;  Laterality: N/A;   CORONARY ANGIOPLASTY  2015   Dr. Lady Gary, [facility] Cath Lab   ESOPHAGOGASTRODUODENOSCOPY (EGD) WITH PROPOFOL N/A 12/21/2016   Procedure: ESOPHAGOGASTRODUODENOSCOPY (EGD) WITH PROPOFOL;  Surgeon: Scot Jun, MD;  Location: [facility] ENDOSCOPY;  Service: Endoscopy;  Laterality: N/A;   ESOPHAGOGASTRODUODENOSCOPY (EGD) WITH PROPOFOL N/A 12/08/2022   Procedure: ESOPHAGOGASTRODUODENOSCOPY (EGD) WITH PROPOFOL;  Surgeon: Jaynie Collins, DO;  Location: [facility] ENDOSCOPY;  Service: Gastroenterology;  Laterality: N/A;   ESOPHAGOGASTRODUODENOSCOPY (  EGD) WITH PROPOFOL N/A 04/19/2023   Procedure: ESOPHAGOGASTRODUODENOSCOPY (EGD) WITH PROPOFOL;  Surgeon: Benancio Deeds, MD;  Location: [facility] ENDOSCOPY;  Service: Gastroenterology;  Laterality: N/A;   EYE SURGERY     FRACTURE SURGERY Left 1991   Fractures Femur, [facility]   HEMOSTASIS CLIP PLACEMENT  04/19/2023   Procedure: HEMOSTASIS CLIP PLACEMENT;  Surgeon: Benancio Deeds, MD;  Location: [facility] ENDOSCOPY;   Service: Gastroenterology;;   HIP ARTHROPLASTY Right 05/07/2020   Procedure: ARTHROPLASTY RIGHT HIP (HEMIARTHROPLASTY);  Surgeon: Juanell Fairly, MD;  Location: [facility] ORS;  Service: Orthopedics;  Laterality: Right;   IMAGE GUIDED SINUS SURGERY Bilateral 09/28/2015   Procedure: IMAGE GUIDED SINUS SURGERY, SEPTOPLASTY, BILATERAL FRONTAL SINUSOTOMIES, BILATERAL MAXILLARY ANTROSTOMIES, BILATERAL TOTAL ETHMOIDECTOMY, BILATERAL SPHENOIDECTOMY, BILATERAL INFERIOR TURBINATE REDUCTION;  Surgeon: Vernie Murders, MD;  Location: [facility] ORS;  Service: ENT;  Laterality: Bilateral;   LEFT HEART CATH AND CORONARY ANGIOGRAPHY Left 01/19/2023   Procedure: LEFT HEART CATH AND CORONARY ANGIOGRAPHY;  Surgeon: Marcina Millard, MD;  Location: [facility] INVASIVE CV LAB;  Service: Cardiovascular;  Laterality: Left;   POLYPECTOMY  04/19/2023   Procedure: POLYPECTOMY;  Surgeon: Benancio Deeds, MD;  Location: [facility] ENDOSCOPY;  Service: Gastroenterology;;   POLYPECTOMY  04/21/2023   Procedure: POLYPECTOMY;  Surgeon: Benancio Deeds, MD;  Location: [facility] ENDOSCOPY;  Service: Gastroenterology;;   RECTOCELE REPAIR            Objective Findings:  Vitals:   06/20/23 1230 06/20/23 1231  BP: 134/81   Pulse: 91   Resp: 17   Temp: 97.7 F (36.5 C)   TempSrc: Oral   SpO2: 100%   Weight:  104.8 kg  Height:  5\' 3"  (1.6 m)   No intake or output data in the 24 hours ending 06/20/23 1640 Filed Weights   06/20/23 1231  Weight: 104.8 kg    Examination:  Physical Exam Constitutional:      General: She is not in acute distress.    Appearance: Normal appearance.  HENT:     Head: Normocephalic and atraumatic.  Eyes:     Extraocular Movements: Extraocular movements intact.     Conjunctiva/sclera: Conjunctivae normal.  Cardiovascular:     Rate and Rhythm: Normal rate and regular rhythm.     Heart sounds: Normal heart sounds.  Pulmonary:     Effort: Pulmonary effort is normal.     Breath sounds: Normal breath  sounds.  Abdominal:     General: Abdomen is flat. Bowel sounds are normal. There is no distension.     Palpations: Abdomen is soft.     Tenderness: There is no abdominal tenderness. There is no guarding.  Musculoskeletal:        General: Normal range of motion.     Cervical back: Normal range of motion.     Right lower leg: No edema.     Left lower leg: No edema.  Skin:    General: Skin is warm and dry.     Coloration: Skin is pale.  Neurological:     General: No focal deficit present.     Mental Status: She is alert and oriented to person, place, and time. Mental status is at baseline.  Psychiatric:        Mood and Affect: Mood normal.        Behavior: Behavior normal.        Thought Content: Thought content normal.        Judgment: Judgment normal.  Scheduled Medications:   cefdinir  300 mg Oral Q12H   citalopram  40 mg Oral Daily   clonazePAM  1 mg Oral QHS   gabapentin  600-1,200 mg Oral See admin instructions   [START ON 06/21/2023] levothyroxine  150 mcg Oral Q0600   pantoprazole (PROTONIX) IV  40 mg Intravenous Q12H    Continuous Infusions:    PRN Medications:  acetaminophen **OR** acetaminophen, bisacodyl, hydrALAZINE, ondansetron **OR** ondansetron (ZOFRAN) IV, oxyCODONE, polyethylene glycol, tiZANidine  Antimicrobials:  Anti-infectives (From admission, onward)    Start     Dose/Rate Route Frequency Ordered Stop   06/20/23 2200  cefdinir (OMNICEF) capsule 300 mg        300 mg Oral Every 12 hours 06/20/23 1637             Data Reviewed: I have personally reviewed following labs and imaging studies  CBC: Recent Labs  Lab 06/20/23 1232  WBC 6.7  HGB 6.6*  HCT 22.8*  MCV 83.2  PLT 305   Basic Metabolic Panel: Recent Labs  Lab 06/20/23 1232  NA 134*  K 3.9  CL 100  CO2 27  GLUCOSE 109*  BUN 19  CREATININE 0.76  CALCIUM 8.3*   GFR: Estimated Creatinine Clearance: 63.9 mL/min (by C-G formula based on SCr of 0.76  mg/dL). Liver Function Tests: Recent Labs  Lab 06/20/23 1232  AST 16  ALT 11  ALKPHOS 66  BILITOT 0.6  PROT 6.6  ALBUMIN 3.4*   No results for input(s): "LIPASE", "AMYLASE" in the last 168 hours. No results for input(s): "AMMONIA" in the last 168 hours. Coagulation Profile: Recent Labs  Lab 06/20/23 1232  INR 1.1   Cardiac Enzymes: No results for input(s): "CKTOTAL", "CKMB", "CKMBINDEX", "TROPONINI" in the last 168 hours. BNP (last 3 results) No results for input(s): "PROBNP" in the last 8760 hours. HbA1C: No results for input(s): "HGBA1C" in the last 72 hours. CBG: No results for input(s): "GLUCAP" in the last 168 hours. Lipid Profile: No results for input(s): "CHOL", "HDL", "LDLCALC", "TRIG", "CHOLHDL", "LDLDIRECT" in the last 72 hours. Thyroid Function Tests: No results for input(s): "TSH", "T4TOTAL", "FREET4", "T3FREE", "THYROIDAB" in the last 72 hours. Anemia Panel: No results for input(s): "VITAMINB12", "FOLATE", "FERRITIN", "TIBC", "IRON", "RETICCTPCT" in the last 72 hours. Most Recent Urinalysis On File:     Component Value Date/Time   COLORURINE STRAW (A) 04/17/2023 1808   APPEARANCEUR Hazy (A) 06/15/2023 1124   LABSPEC 1.010 04/17/2023 1808   LABSPEC 1.005 07/23/2014 1444   PHURINE 7.0 04/17/2023 1808   GLUCOSEU Negative 06/15/2023 1124   GLUCOSEU NEGATIVE 07/23/2014 1444   HGBUR NEGATIVE 04/17/2023 1808   BILIRUBINUR Negative 06/15/2023 1124   BILIRUBINUR NEGATIVE 07/23/2014 1444   KETONESUR NEGATIVE 04/17/2023 1808   PROTEINUR Negative 06/15/2023 1124   PROTEINUR NEGATIVE 04/17/2023 1808   NITRITE Positive (A) 06/15/2023 1124   NITRITE NEGATIVE 04/17/2023 1808   LEUKOCYTESUR 1+ (A) 06/15/2023 1124   LEUKOCYTESUR NEGATIVE 04/17/2023 1808   LEUKOCYTESUR NEGATIVE 07/23/2014 1444   Sepsis Labs: @LABRCNTIP (procalcitonin:4,lacticidven:4)  Recent Results (from the past 240 hours)  CULTURE, URINE COMPREHENSIVE     Status: Abnormal   Collection Time:  06/15/23 11:24 AM   Specimen: Urine   UR  Result Value Ref Range Status   Urine Culture, Comprehensive Final report (A)  Final   Organism ID, Bacteria Escherichia coli (A)  Final    Comment: Cefazolin with an MIC <=16 predicts susceptibility to the oral agents cefaclor, cefdinir, cefpodoxime, cefprozil, cefuroxime, cephalexin,  and loracarbef when used for therapy of uncomplicated urinary tract infections due to E. coli, Klebsiella pneumoniae, and Proteus mirabilis. Multi-Drug Resistant Organism Greater than 100,000 colony forming units per mL    ANTIMICROBIAL SUSCEPTIBILITY Comment  Final    Comment:       ** S = Susceptible; I = Intermediate; R = Resistant **                    P = Positive; N = Negative             MICS are expressed in micrograms per mL    Antibiotic                 RSLT#1    RSLT#2    RSLT#3    RSLT#4 Amoxicillin/Clavulanic Acid    I Ampicillin                     R Cefazolin                      S Cefepime                       S Ceftriaxone                    S Cefuroxime                     S Ciprofloxacin                  S Ertapenem                      S Gentamicin                     S Imipenem                       S Levofloxacin                   S Meropenem                      S Nitrofurantoin                 S Piperacillin/Tazobactam        S Tetracycline                   R Tobramycin                     S Trimethoprim/Sulfa             R   Microscopic Examination     Status: Abnormal   Collection Time: 06/15/23 11:24 AM   Urine  Result Value Ref Range Status   WBC, UA >30 (A) 0 - 5 /hpf Final   RBC, Urine 0-2 0 - 2 /hpf Final   Epithelial Cells (non renal) 0-10 0 - 10 /hpf Final   Mucus, UA Present (A) Not Estab. Final   Bacteria, UA Many (A) None seen/Few Final         Radiology Studies: No results found.           LOS: 0 days        Sunnie Nielsen, DO [facility] 06/20/2023, 4:40 PM    Dictation  software may have been used to generate the  above note. Typos may occur and escape review in typed/dictated notes. Please contact Dr Lyn Hollingshead directly for clarity if needed.  Staff may message me via secure chat in Epic  but this may not receive an immediate response,  please page me for urgent matters!  If 7PM-7AM, please contact night coverage [email or web address]

## 2023-06-20 NOTE — Hospital Course (Addendum)
HPI: Stacy Allen is a 82 y.o. 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 Presents to ED 06/20/23 for concerns abnormal labs (anemia) and fatigue. Of note, was admitted to hospital for GI bleed 03/2023. EGD showed large hiatal hernia, benign-appearing gastric polyp-no obvious bleeding source. Colonoscopy showed cecal polyp, internal hemorrhoids, no foci of bleeding. Discharged home. Was off Eliquis following hospitalization. Recently restarted on Eliquis lower dose 2.5 mg bid by vascular surgery 06/08/23 given concern for hx recurrent DVT. Pt was taking this, noted darker stool, stopped the Eliquis 06/18/23 (2 days prior to presentation to ED - last dose she thinks Sat night or Sun morning)    Hospital course / significant events:  12/24: to ED, Hgb 6.6. 2 units PRBC ordered by EDP. Admitted to hospitalist service.  12/25: Hgb 8.2. GI consult --> no benefit to repeat scope, recs for capsule endoscopy tomorrow. 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 but complicated by GIB, large hiatal hernia, GERD Chronic iron deficiency anemia Note last dose eliquis about 2 days ago she thinks AM 12/22 Protonix IV 40 mg bid  CLD --> regular diet this afternoon GI consult --> capsule endoscopy  No anticoagulation Monitor HH  Hx provoked DVT - "recurrent DVT" is on record but only noted clot dx appears to  be following hip surgery in 2021  Per vascular, clot likely endothelialized and will continue to show up on imaging as "chronic DVT" D/c anticoagulation  Hypercoagulable workup recommended by vascular surgery for completeness - if this reveals concern for high risk clot, would consider IVC filter  DOAC can impact the accuracy of testing, it is recommended to wait 2 weeks following discontinuation of anticoagulation, follow outpatient to consider testing   CAD s/p PCI HTN Statin intolerance, ASA intolerance  Amlodipine at home - holding  Not on BB, ACE/ARB at home Hydralazine prn HTN   UTI - recent 06/15/2023 Ecoli pansensitive  Cefuroxime started 12/19 Continue omnicef (cefuroxime not on formulary)  Hypothyroid Continue home levothyroxine 150 mcg dose confirmed w/ patient  Anxiety/depression Continue Celexa, clonazepam  Neuropathic pain Continue home gabapentin   Class 3 based on BMI: Body mass index is 40.92 kg/m.  Underweight - under 18  overweight - 25 to 29 obese - 30 or more Class 1 obesity: BMI of 30.0 to 34 Class 2 obesity: BMI of 35.0 to 39 Class 3 obesity: BMI of 40.0 to 49 Super Morbid Obesity: BMI 50-59 Super-super Morbid Obesity: BMI 60+ Significantly low or high BMI is associated with higher medical risk.  Weight management advised as adjunct to other disease management and risk reduction treatments   DVT prophylaxis: SCD IV fluids: no continuous IV fluids  Nutrition: CLD Central lines / invasive devices: none  Code Status: DNR confirmed w/ patient on admission by Dr Lyn Hollingshead  ACP documentation reviewed: DNR form is on file in [facility]  [facility] needs: none Barriers to dispo / significant pending items:  capsule endoscopy / GI clearance

## 2023-06-20 NOTE — ED Provider Notes (Signed)
[facility] Provider Note    Event Date/Time   First MD Initiated Contact with Patient 06/20/23 1438     (approximate)   History   Abnormal Labs   HPI  Stacy Allen is a 82 y.o. female history of GI bleeding DVT  History of prior GI bleeding, suspected from an upper source.  Patient was on anticoagulation at that time for treatment of a DVT.  Anticoagulation status is that she has self discontinued it as of about 3 days ago  Patient reports similar symptoms in around October.  She started noticing bloody stool then.  She was admitted.  She and her family advised that she is DNR, and they tell me that she does not wish to entertain having repeat endoscopy.  She is however amenable to receiving blood transfusion and feels like she needs 2 units of blood  She has been having dark stool for several days, restarted her Eliquis briefly at the request of vascular surgery for DVT but noticed within a day or 2 that she was having bloody stool and discontinued its use on Sunday  She is feeling fatigued.  No chest pain or shortness of breath.  No abdominal pain. She had a small amount of black "coffee ground" as she describes it and a small amount of vomit yesterday  Physical Exam   Triage Vital Signs: ED Triage Vitals  Encounter Vitals Group     BP 06/20/23 1230 134/81     Systolic BP Percentile --      Diastolic BP Percentile --      Pulse Rate 06/20/23 1230 91     Resp 06/20/23 1230 17     Temp 06/20/23 1230 97.7 F (36.5 C)     Temp Source 06/20/23 1230 Oral     SpO2 06/20/23 1230 100 %     Weight 06/20/23 1231 231 lb (104.8 kg)     Height 06/20/23 1231 5\' 3"  (1.6 m)     Head Circumference --      Peak Flow --      Pain Score 06/20/23 1230 0     Pain Loc --      Pain Education --      Exclude from Growth Chart --     Most recent vital signs: Vitals:   06/20/23 1928 06/20/23 1930  BP:  (!) 146/72  Pulse: 76 78  Resp:  20  Temp: 97.9 F (36.6  C) 97.9 F (36.6 C)  SpO2: 99% 99%     General: Awake, no distress.  CV:  Good peripheral perfusion.  Resp:  Normal effort.  Abd:  No distention.  Soft nontender nondistended throughout Other:  Fully alert well-perfused, in no distress.  Patient requesting liquid diet   ED Results / Procedures / Treatments   Labs (all labs ordered are listed, but only abnormal results are displayed) Labs Reviewed  COMPREHENSIVE METABOLIC PANEL - Abnormal; Notable for the following components:      Result Value   Sodium 134 (*)    Glucose, Bld 109 (*)    Calcium 8.3 (*)    Albumin 3.4 (*)    All other components within normal limits  CBC - Abnormal; Notable for the following components:   RBC 2.74 (*)    Hemoglobin 6.6 (*)    HCT 22.8 (*)    MCH 24.1 (*)    MCHC 28.9 (*)    RDW 20.2 (*)    All other components within  normal limits  PROTIME-INR  BASIC METABOLIC PANEL  CBC  POC OCCULT BLOOD, ED  TYPE AND SCREEN  PREPARE RBC (CROSSMATCH)   Discussed risks benefits and alternatives to blood transfusion with the patient.  Patient agreeable to proceeding with transfusion and amenable to signing consent.  She has previously received transfusions  EKG     RADIOLOGY  Painless.  No abdominal symptoms.  Reports dark stool.  No noted indication for imaging study at this time.  She is not currently exhibiting symptoms of massive bleeding, has no associated abdominal pain.   PROCEDURES:  Critical Care performed: Yes, see critical care procedure note(s)  CRITICAL CARE Performed by: Sharyn Creamer   Total critical care time: 30 minutes  Critical care time was exclusive of separately billable procedures and treating other patients.  Critical care was necessary to treat or prevent imminent or life-threatening deterioration.  Critical care was time spent personally by me on the following activities: development of treatment plan with patient and/or surrogate as well as nursing, discussions  with consultants, evaluation of patient's response to treatment, examination of patient, obtaining history from patient or surrogate, ordering and performing treatments and interventions, ordering and review of laboratory studies, ordering and review of radiographic studies, pulse oximetry and re-evaluation of patient's condition.   Procedures   MEDICATIONS ORDERED IN ED: Medications  acetaminophen (TYLENOL) tablet 650 mg (has no administration in time range)    Or  acetaminophen (TYLENOL) suppository 650 mg (has no administration in time range)  oxyCODONE (Oxy IR/ROXICODONE) immediate release tablet 5 mg (has no administration in time range)  polyethylene glycol (MIRALAX / GLYCOLAX) packet 17 g (has no administration in time range)  bisacodyl (DULCOLAX) suppository 10 mg (has no administration in time range)  ondansetron (ZOFRAN) tablet 4 mg (has no administration in time range)    Or  ondansetron (ZOFRAN) injection 4 mg (has no administration in time range)  hydrALAZINE (APRESOLINE) injection 5 mg (has no administration in time range)  pantoprazole (PROTONIX) injection 40 mg (has no administration in time range)  citalopram (CELEXA) tablet 20 mg (has no administration in time range)  clonazePAM (KLONOPIN) tablet 1 mg (has no administration in time range)  gabapentin (NEURONTIN) tablet 600-1,200 mg (has no administration in time range)  levothyroxine (SYNTHROID) tablet 150 mcg (has no administration in time range)  tiZANidine (ZANAFLEX) tablet 4 mg (has no administration in time range)  cefdinir (OMNICEF) capsule 300 mg (has no administration in time range)  pantoprazole (PROTONIX) injection 40 mg (40 mg Intravenous Given 06/20/23 1636)     IMPRESSION / MDM / ASSESSMENT AND PLAN / ED COURSE  I reviewed the triage vital signs and the nursing notes.                              Differential diagnosis includes, but is not limited to, recurrent GI bleeding, upper GI bleeding,  gastrointestinal hemorrhage, no noted history of varices but has a known large hiatal hernia.  She has no symptoms of infection.  She is hemodynamically stable but with fairly severe anemia.  She has self discontinued her anticoagulant.  Will transfuse, consulted with our hospitalist for admission.  The patient is adamant that she would not want to entertain repeat endoscopy at this point, she also advises that she would like a diet at least a liquid diet and we will order that.  I did inform her that being on a diet could preclude  her from having emergent endoscopy if her bleeding worsen, but she and her family relate her desires and her DNR status and that she is amenable to being on a diet even if it could preclude her from endoscopy if needed emergently.   [name] presentation is most consistent with acute presentation with potential threat to life or bodily function.      Patient has known DVT, she has already discontinued her Eliquis when she became suspicious that she started having dark stools and bleeding on Sunday.   Consulted with and patient accepted to hospital service by Dr. Lyn Hollingshead  FINAL CLINICAL IMPRESSION(S) / ED DIAGNOSES   Final diagnoses:  Symptomatic anemia  Gastrointestinal hemorrhage, unspecified gastrointestinal hemorrhage type     Rx / DC Orders   ED Discharge Orders     None        Note:  This document was prepared using Dragon voice recognition software and may include unintentional dictation errors.   Sharyn Creamer, MD 06/20/23 2000

## 2023-06-20 NOTE — ED Triage Notes (Signed)
Pt sts that her Dr. told her that her hemoglobin is low and that she needs a blood transfusion.

## 2023-06-21 ENCOUNTER — Encounter: Payer: Self-pay | Admitting: Osteopathic Medicine

## 2023-06-21 DIAGNOSIS — D649 Anemia, unspecified: Secondary | ICD-10-CM

## 2023-06-21 DIAGNOSIS — K922 Gastrointestinal hemorrhage, unspecified: Secondary | ICD-10-CM

## 2023-06-21 LAB — CBC
HCT: 26 % — ABNORMAL LOW (ref 36.0–46.0)
Hemoglobin: 8.2 g/dL — ABNORMAL LOW (ref 12.0–15.0)
MCH: 25.4 pg — ABNORMAL LOW (ref 26.0–34.0)
MCHC: 31.5 g/dL (ref 30.0–36.0)
MCV: 80.5 fL (ref 80.0–100.0)
Platelets: 265 10*3/uL (ref 150–400)
RBC: 3.23 MIL/uL — ABNORMAL LOW (ref 3.87–5.11)
RDW: 18.4 % — ABNORMAL HIGH (ref 11.5–15.5)
WBC: 4.2 10*3/uL (ref 4.0–10.5)
nRBC: 0 % (ref 0.0–0.2)

## 2023-06-21 LAB — BASIC METABOLIC PANEL
Anion gap: 7 (ref 5–15)
BUN: 16 mg/dL (ref 8–23)
CO2: 25 mmol/L (ref 22–32)
Calcium: 8.2 mg/dL — ABNORMAL LOW (ref 8.9–10.3)
Chloride: 103 mmol/L (ref 98–111)
Creatinine, Ser: 0.75 mg/dL (ref 0.44–1.00)
GFR, Estimated: >60 mL/min (ref >60)
Glucose, Bld: 111 mg/dL — ABNORMAL HIGH (ref 70–99)
Potassium: 4.3 mmol/L (ref 3.5–5.1)
Sodium: 135 mmol/L (ref 135–145)

## 2023-06-21 NOTE — Plan of Care (Signed)
  Problem: Health Behavior/Discharge Planning: Goal: Ability to manage health-related needs will improve Outcome: Progressing   Problem: Clinical Measurements: Goal: Diagnostic test results will improve Outcome: Progressing   Problem: Activity: Goal: Risk for activity intolerance will decrease Outcome: Progressing   Problem: Safety: Goal: Ability to remain free from injury will improve Outcome: Progressing

## 2023-06-21 NOTE — Consult Note (Signed)
[facility]  Patient name: Stacy Allen MRN: [id number] DOB: 05-08-1941 Sex: female   REQUESTING PROVIDER:    Hospitalists    REASON FOR CONSULT:    Chronic DVT  HISTORY OF PRESENT ILLNESS:   Stacy Allen is a 82 y.o. female, who was admitted to the hospital with symptomatic anemia.  She has had similar issues with GI bleeding in the past.  She was recently seen in our office and her Eliquis dose was decreased.  She is on anticoagulation for history of DVT.  She has been on anticoagulation for this.  She describes multiple DVTs in the past however she states that she first had a DVT following a period of immobility after a hip fracture.  Subsequent ultrasound have shown recurrent thrombus.  In 2021 she was diagnosed with a near occlusive DVT in the right femoral vein.  Ultrasound in 2016 was negative for right leg DVT   She has a history of coronary artery disease, status post PCI.  She is medically managed for hypertension.  She is on statin for hypercholesterolemia.  She is a former smoker.  PAST MEDICAL HISTORY    Past Medical History:  Diagnosis Date   Adenomatous polyps    Allergic state    Anemia    Anginal pain (HCC)    Anxiety    Arthritis    osteoarthritis   Broken rib    left   Cancer (HCC)    skin   CHF (congestive heart failure) (HCC)    Chicken pox    Complication of anesthesia    respiratory distress after rectocele surgery   Coronary artery disease    Depression    Eczema    Fibromyalgia    GERD (gastroesophageal reflux disease)    Headache    migraines   Hemorrhoids    History of hiatal hernia    Hypercalcemia    Hyperlipidemia    Hypertension    Hypothyroidism    Lumbar stenosis    Obesity    Peptic ulcer disease    Shortness of breath dyspnea    Sleep apnea    No CPAP   Vertigo    Vitamin D deficiency      FAMILY HISTORY   Family History  Problem Relation Age of  Onset   Hypertension Mother    Cancer Mother    Diabetes Father    Hypertension Father    Heart failure Father    Breast cancer Neg Hx     SOCIAL HISTORY:   Social History   Socioeconomic History   Marital status: Widowed    Spouse name: Not on file   Number of children: Not on file   Years of education: Not on file   Highest education level: Not on file  Occupational History   Not on file  Tobacco Use   Smoking status: Former    Current packs/day: 0.00    Types: Cigarettes    Quit date: 2000    Years since quitting: 25.0    Passive exposure: Past   Smokeless tobacco: Never  Vaping Use   Vaping status: Never Used  Substance and Sexual Activity   Alcohol use: No   Drug use: No   Sexual activity: Not on file  Other Topics Concern   Not on file  Social History Narrative   Lives in camper in nephew's backyard. No pets.   Social Drivers of Corporate investment banker Strain: Low  Risk  (06/02/2023)   Received from [facility]   Overall Financial Resource Strain (CARDIA)    Difficulty of Paying Living Expenses: Not hard at all  Food Insecurity: No Food Insecurity (06/21/2023)   Hunger Vital Sign    Worried About Running Out of Food in the Last Year: Never true    Ran Out of Food in the Last Year: Never true  Transportation Needs: No Transportation Needs (06/21/2023)   PRAPARE - Administrator, Civil Service (Medical): No    Lack of Transportation (Non-Medical): No  Physical Activity: Not on file  Stress: Not on file  Social Connections: Not on file  Intimate Partner Violence: Not At Risk (06/21/2023)   Humiliation, Afraid, Rape, and Kick questionnaire    Fear of Current or Ex-Partner: No    Emotionally Abused: No    Physically Abused: No    Sexually Abused: No    ALLERGIES:    Allergies  Allergen Reactions   Azithromycin Diarrhea and Rash    "fainting"  Other reaction(s): Syncope  "fainting"    Other reaction(s): NOT  KNOWN  "fainting"   Meloxicam Diarrhea   Morphine Other (See Comments) and Rash    Other reaction(s): Unknown Retain urine Other reaction(s): NOT KNOWN, Other (See Comments) Retain urine Other reaction(s): side effect of severe rash   Adhesive [Tape]     blisters   Codeine Nausea And Vomiting   Lactose Intolerance (Gi) Other (See Comments)    "stomach pain"   Red Dye #40 (Allura Red) Itching    "red food dye"   Shellfish-Derived Products Itching and Swelling   Aspirin Rash   Latex Rash    With extended contact   Lodine [Etodolac] Rash   Morphine And Codeine Hives and Nausea And Vomiting    Other reaction(s): UNKNOWN   Penicillins Rash and Hives    Given on 05/07/20.  No reaction noted.   Pravastatin Rash    CURRENT MEDICATIONS:    Current Facility-Administered Medications  Medication Dose Route Frequency Provider Last Rate Last Admin   acetaminophen (TYLENOL) tablet 650 mg  650 mg Oral Q6H PRN Sunnie Nielsen, DO   650 mg at 06/21/23 6301   Or   acetaminophen (TYLENOL) suppository 650 mg  650 mg Rectal Q6H PRN Sunnie Nielsen, DO       bisacodyl (DULCOLAX) suppository 10 mg  10 mg Rectal Daily PRN Sunnie Nielsen, DO       cefdinir (OMNICEF) capsule 300 mg  300 mg Oral Q12H Sunnie Nielsen, DO   300 mg at 06/21/23 6010   citalopram (CELEXA) tablet 20 mg  20 mg Oral Daily Sunnie Nielsen, DO   20 mg at 06/21/23 9323   clonazePAM (KLONOPIN) tablet 1 mg  1 mg Oral QHS Sunnie Nielsen, DO   1 mg at 06/20/23 2221   gabapentin (NEURONTIN) capsule 1,200 mg  1,200 mg Oral QHS Barrie Folk, RPH   1,200 mg at 06/20/23 2221   gabapentin (NEURONTIN) capsule 600 mg  600 mg Oral Daily Sunnie Nielsen, DO   600 mg at 06/21/23 5573   hydrALAZINE (APRESOLINE) injection 5 mg  5 mg Intravenous Q4H PRN Sunnie Nielsen, DO       levothyroxine (SYNTHROID) tablet 150 mcg  150 mcg Oral Q0600 Sunnie Nielsen, DO   150 mcg at 06/21/23 0601   ondansetron [facility])  tablet 4 mg  4 mg Oral Q6H PRN Sunnie Nielsen, DO       Or  ondansetron (ZOFRAN) injection 4 mg  4 mg Intravenous Q6H PRN Sunnie Nielsen, DO       oxyCODONE (Oxy IR/ROXICODONE) immediate release tablet 5 mg  5 mg Oral Q4H PRN Sunnie Nielsen, DO       pantoprazole (PROTONIX) injection 40 mg  40 mg Intravenous Q12H Sunnie Nielsen, DO   40 mg at 06/21/23 9604   polyethylene glycol (MIRALAX / GLYCOLAX) packet 17 g  17 g Oral Daily PRN Sunnie Nielsen, DO       tiZANidine (ZANAFLEX) tablet 4 mg  4 mg Oral QHS PRN Sunnie Nielsen, DO   4 mg at 06/20/23 2221    REVIEW OF SYSTEMS:   [X]  denotes positive finding, [ ]  denotes negative finding Cardiac  Comments:  Chest pain or chest pressure:    Shortness of breath upon exertion:    Short of breath when lying flat:    Irregular heart rhythm:        Vascular    Pain in calf, thigh, or hip brought on by ambulation:    Pain in feet at night that wakes you up from your sleep:     Blood clot in your veins:    Leg swelling:  x       Pulmonary    Oxygen at home:    Productive cough:     Wheezing:         Neurologic    Sudden weakness in arms or legs:     Sudden numbness in arms or legs:     Sudden onset of difficulty speaking or slurred speech:    Temporary loss of vision in one eye:     Problems with dizziness:         Gastrointestinal    Blood in stool:      Vomited blood:         Genitourinary    Burning when urinating:     Blood in urine:        Psychiatric    Major depression:         Hematologic    Bleeding problems:    Problems with blood clotting too easily:        Skin    Rashes or ulcers:        Constitutional    Fever or chills:     PHYSICAL EXAM:   Vitals:   06/21/23 0154 06/21/23 0236 06/21/23 0342 06/21/23 0744  BP:  124/69 (!) 116/43 (!) 145/82  Pulse:  65 66 76  Resp:  18 18 15   Temp: (!) 97.4 F (36.3 C) 97.6 F (36.4 C) (!) 97.5 F (36.4 C) 98.2 F (36.8 C)  TempSrc: Oral  Oral Axillary Oral  SpO2:  98% 96% 99%  Weight:      Height:        GENERAL: The patient is a well-nourished female, in no acute distress. The vital signs are documented above. CARDIAC: There is a regular rate and rhythm.  PULMONARY: Nonlabored respirations MUSCULOSKELETAL: There are no major deformities or cyanosis. NEUROLOGIC: No focal weakness or paresthesias are detected. SKIN: There are no ulcers or rashes noted. PSYCHIATRIC: The patient has a normal affect.  STUDIES:   I have reviewed the following ultrasound: 1. Eccentric nonocclusive thrombus in the right popliteal vein similar to prior study likely indicating chronic deep venous thrombosis. 2. Otherwise, no evidence of deep venous thrombosis in the visualized lower extremity veins. 3. Bilateral popliteal cysts.  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  Therefore, she is likely going to need to come off of her anticoagulation.  She is scheduled for capsule endoscopy tomorrow.  From talking to the patient and looking through her records it sounds like her anticoagulation was started following a DVT in 2021.  This was associated with a period of inactivity from her hip fracture.  I am unable to get any other evidence that she has a hypercoagulable state.  This sounds like a provoked DVT that has endothelialized and shows up is chronic DVT on subsequent imaging.  She would likely benefit from a hypercoagulable workup.  Options at this time are to stop her anticoagulation if indeed this was a provoked DVT from her hip fracture.  Current recommendations would be 3 months of anticoagulation after provoked clot.  Alternatively since she cannot be anticoagulated if it is felt that she is at risk for future thrombotic events, a IVC filter could be placed.     Charlena Cross, MD, FACS [facility] [phone] Pager [phone]

## 2023-06-21 NOTE — Progress Notes (Addendum)
PROGRESS NOTE    Stacy Allen   ZOX:[id number] DOB: Dec 25, 1940  DOA: 06/20/2023 Date of Service: 06/21/23 which is hospital day 0  PCP: Jodi Marble, NP    HPI: Stacy Allen is a 82 y.o. 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 Presents to ED 06/20/23 for concerns abnormal labs (anemia) and fatigue. Of note, was admitted to hospital for GI bleed 03/2023. EGD showed large hiatal hernia, benign-appearing gastric polyp-no obvious bleeding source. Colonoscopy showed cecal polyp, internal hemorrhoids, no foci of bleeding. Discharged home. Was off Eliquis following hospitalization. Recently restarted on Eliquis lower dose 2.5 mg bid by vascular surgery 06/08/23 given concern for hx recurrent DVT. Pt was taking this, noted darker stool, stopped the Eliquis 06/18/23 (2 days prior to presentation to ED - last dose she thinks Sat night or Sun morning)    Hospital course / significant events:  12/24: to ED, Hgb 6.6. 2 units PRBC ordered by EDP. Admitted to hospitalist service.  12/25: Hgb 8.2. GI consult --> no benefit to repeat scope, recs for capsule endoscopy tomorrow. 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 but complicated by GIB, large hiatal hernia, GERD Chronic iron deficiency anemia Note last dose eliquis about 2 days ago she thinks AM 12/22 Protonix IV 40 mg bid  [facility] GI consult --> capsule endoscopy tomorrow  No  anticoagulation Monitor HH  Hx provoked DVT - "recurrent DVT" is on record but only noted clot dx appears to be following hip surgery in 2021  Per vascular, clot likely endothelialized and will continue to show up on imaging as "chronic DVT" D/c anticoagulation  Hypercoagulable workup recommended by vascular surgery for completeness - if this reveals concern for high risk clot, would consider IVC filter  DOAC can impact the accuracy of testing, it is recommended to wait 2 weeks following discontinuation of anticoagulation, follow outpatient to consider testing   CAD s/p PCI HTN Statin intolerance, ASA intolerance  Amlodipine at home - holding  Not on BB, ACE/ARB at home Hydralazine prn HTN   UTI - recent 06/15/2023 Ecoli pansensitive  Cefuroxime started 12/19 Continue omnicef (cefuroxime not on formulary)  Hypothyroid Continue home levothyroxine 150 mcg dose confirmed w/ patient  Anxiety/depression Continue Celexa, clonazepam  Neuropathic pain Continue home gabapentin   Class 3 based on BMI: Body mass index is 40.92 kg/m.  Underweight - under 18  overweight - 25 to 29 obese - 30 or more Class 1 obesity: BMI of 30.0 to 34 Class 2 obesity: BMI of 35.0 to 39 Class 3 obesity: BMI of 40.0 to 49 Super Morbid Obesity: BMI 50-59 Super-super Morbid Obesity: BMI 60+ Significantly low or high BMI is associated with higher medical risk.  Weight management advised as adjunct to other disease management and risk reduction treatments   DVT prophylaxis: SCD IV fluids: no continuous IV fluids  Nutrition: CLD Central lines / invasive devices: none  Code Status: DNR confirmed w/ patient on admission by Dr  Gema Ringold  ACP documentation reviewed: DNR form is on file in [facility]  [facility] needs: none Barriers to dispo / significant pending items: capsule endoscopy tomorrow              Subjective / Brief ROS:  Patient reports feeling okay today, better following blood transfusion   Denies CP/SOB.  Pain controlled.  Denies new weakness.  Tolerating clears.  Reports no concerns w/ urination/defecation.   Family Communication: none at this time    Objective Findings:  Vitals:   06/21/23 0154 06/21/23 0236 06/21/23 0342 06/21/23 0744  BP:  124/69 (!) 116/43 (!) 145/82  Pulse:  65 66 76  Resp:  18 18 15   Temp: (!) 97.4 F (36.3 C) 97.6 F (36.4 C) (!) 97.5 F (36.4 C) 98.2 F (36.8 C)  TempSrc: Oral Oral Axillary Oral  SpO2:  98% 96% 99%  Weight:      Height:        Intake/Output Summary (Last 24 hours) at 06/21/2023 1725 Last data filed at 06/21/2023 1400 Gross per 24 hour  Intake 766 ml  Output --  Net 766 ml   Filed Weights   06/20/23 1231  Weight: 104.8 kg    Examination:  Physical Exam Constitutional:      General: She is not in acute distress. Cardiovascular:     Rate and Rhythm: Normal rate and regular rhythm.  Pulmonary:     Effort: Pulmonary effort is normal.     Breath sounds: Normal breath sounds.  Skin:    General: Skin is warm and dry.  Neurological:     General: No focal deficit present.     Mental Status: She is alert and oriented to person, place, and time.  Psychiatric:        Mood and Affect: Mood normal.        Behavior: Behavior normal.          Scheduled Medications:   cefdinir  300 mg Oral Q12H   citalopram  20 mg Oral Daily   clonazePAM  1 mg Oral QHS   gabapentin  1,200 mg Oral QHS   gabapentin  600 mg Oral Daily   levothyroxine  150 mcg Oral Q0600   pantoprazole (PROTONIX) IV  40 mg Intravenous Q12H    Continuous Infusions:   PRN Medications:  acetaminophen **OR** acetaminophen, bisacodyl, hydrALAZINE, ondansetron **OR** ondansetron (ZOFRAN) IV, oxyCODONE, polyethylene glycol, tiZANidine  Antimicrobials from admission:  Anti-infectives (From admission, onward)    Start     Dose/Rate Route Frequency Ordered Stop   06/20/23 2200  cefdinir (OMNICEF) capsule 300 mg        300 mg Oral Every  12 hours 06/20/23 1637             Data Reviewed:  I have personally reviewed the following...  CBC: Recent Labs  Lab 06/20/23 1232 06/21/23 0718  WBC 6.7 4.2  HGB 6.6* 8.2*  HCT 22.8* 26.0*  MCV 83.2 80.5  PLT 305 265   Basic Metabolic Panel: Recent Labs  Lab 06/20/23 1232 06/21/23 0718  NA 134* 135  K 3.9 4.3  CL 100 103  CO2 27 25  GLUCOSE 109* 111*  BUN 19 16  CREATININE 0.76 0.75  CALCIUM 8.3* 8.2*   GFR: Estimated Creatinine Clearance: 63.9 mL/min (by C-G formula based on SCr of 0.75 mg/dL). Liver Function Tests: Recent Labs  Lab 06/20/23 1232  AST 16  ALT 11  ALKPHOS 66  BILITOT 0.6  PROT 6.6  ALBUMIN 3.4*   No results for input(s): "LIPASE", "AMYLASE" in the last 168 hours. No results for input(s): "AMMONIA" in the last 168 hours. Coagulation Profile: Recent Labs  Lab 06/20/23 1232  INR 1.1   Cardiac Enzymes: No results for input(s): "CKTOTAL", "CKMB", "CKMBINDEX", "TROPONINI" in the last 168 hours. BNP (last 3 results) No results for input(s): "PROBNP" in the last 8760 hours. HbA1C: No results for input(s): "HGBA1C" in the last 72 hours. CBG: No results for input(s): "GLUCAP" in the last 168 hours. Lipid Profile: No results for input(s): "CHOL", "HDL", "LDLCALC", "TRIG", "CHOLHDL", "LDLDIRECT" in the last 72 hours. Thyroid Function Tests: No results for input(s): "TSH", "T4TOTAL", "FREET4", "T3FREE", "THYROIDAB" in the last 72 hours. Anemia Panel: No results for input(s): "VITAMINB12", "FOLATE", "FERRITIN", "TIBC", "IRON", "RETICCTPCT" in the last 72 hours. Most Recent Urinalysis On File:     Component Value Date/Time   COLORURINE STRAW (A) 04/17/2023 1808   APPEARANCEUR Hazy (A) 06/15/2023 1124   LABSPEC 1.010 04/17/2023 1808   LABSPEC 1.005 07/23/2014 1444   PHURINE 7.0 04/17/2023 1808   GLUCOSEU Negative 06/15/2023 1124   GLUCOSEU NEGATIVE 07/23/2014 1444   HGBUR NEGATIVE 04/17/2023 1808   BILIRUBINUR Negative 06/15/2023  1124   BILIRUBINUR NEGATIVE 07/23/2014 1444   KETONESUR NEGATIVE 04/17/2023 1808   PROTEINUR Negative 06/15/2023 1124   PROTEINUR NEGATIVE 04/17/2023 1808   NITRITE Positive (A) 06/15/2023 1124   NITRITE NEGATIVE 04/17/2023 1808   LEUKOCYTESUR 1+ (A) 06/15/2023 1124   LEUKOCYTESUR NEGATIVE 04/17/2023 1808   LEUKOCYTESUR NEGATIVE 07/23/2014 1444   Sepsis Labs: @LABRCNTIP (procalcitonin:4,lacticidven:4) Microbiology: Recent Results (from the past 240 hours)  CULTURE, URINE COMPREHENSIVE     Status: Abnormal   Collection Time: 06/15/23 11:24 AM   Specimen: Urine   UR  Result Value Ref Range Status   Urine Culture, Comprehensive Final report (A)  Final   Organism ID, Bacteria Escherichia coli (A)  Final    Comment: Cefazolin with an MIC <=16 predicts susceptibility to the oral agents cefaclor, cefdinir, cefpodoxime, cefprozil, cefuroxime, cephalexin, and loracarbef when used for therapy of uncomplicated urinary tract infections due to E. coli, Klebsiella pneumoniae, and Proteus mirabilis. Multi-Drug Resistant Organism Greater than 100,000 colony forming units per mL    ANTIMICROBIAL SUSCEPTIBILITY Comment  Final    Comment:       ** S = Susceptible; I = Intermediate; R = Resistant **                    P = Positive; N = Negative             MICS are expressed in micrograms per mL    Antibiotic                 RSLT#1    RSLT#2    RSLT#3    RSLT#4 Amoxicillin/Clavulanic Acid    I Ampicillin                     R Cefazolin                      S Cefepime                       S Ceftriaxone                    S Cefuroxime  S Ciprofloxacin                  S Ertapenem                      S Gentamicin                     S Imipenem                       S Levofloxacin                   S Meropenem                      S Nitrofurantoin                 S Piperacillin/Tazobactam        S Tetracycline                   R Tobramycin                      S Trimethoprim/Sulfa             R   Microscopic Examination     Status: Abnormal   Collection Time: 06/15/23 11:24 AM   Urine  Result Value Ref Range Status   WBC, UA >30 (A) 0 - 5 /hpf Final   RBC, Urine 0-2 0 - 2 /hpf Final   Epithelial Cells (non renal) 0-10 0 - 10 /hpf Final   Mucus, UA Present (A) Not Estab. Final   Bacteria, UA Many (A) None seen/Few Final      Radiology Studies last 3 days: US Venous Img Lower Bilateral (DVT) Result Date: 06/20/2023 CLINICAL DATA:  History of recurrent deep venous thrombosis. Leg pain, varicose veins, skin malignancy and thyroid hormone therapy. No anticoagulation. EXAM: Bilateral LOWER EXTREMITY VENOUS DOPPLER ULTRASOUND TECHNIQUE: Gray-scale sonography with compression, as well as color and duplex ultrasound, were performed to evaluate the deep venous system(s) from the level of the common femoral vein through the popliteal and proximal calf veins. COMPARISON:  None Available. FINDINGS: VENOUS Normal compressibility of the common femoral, superficial femoral, and left popliteal veins, as well as the visualized calf veins. Visualized portions of profunda femoral vein and great saphenous vein unremarkable. No filling defects to suggest DVT on grayscale or color Doppler imaging. Doppler waveforms show normal direction of venous flow, normal respiratory plasticity and response to augmentation. There is eccentric nonocclusive thrombus demonstrated in the right popliteal vein. Appearance is similar to the previous study likely indicating chronic venous thrombosis. OTHER Bilateral popliteal cysts, right measuring 4.7 x 1.4 x 2.8 cm and left 7 x 2.7 x 2.2 cm. Limitations: none IMPRESSION: 1. Eccentric nonocclusive thrombus in the right popliteal vein similar to prior study likely indicating chronic deep venous thrombosis. 2. Otherwise, no evidence of deep venous thrombosis in the visualized lower extremity veins. 3. Bilateral popliteal cysts. Electronically  Signed   By: Burman Nieves M.D.   On: 06/20/2023 18:54           Sunnie Nielsen, DO [facility] 06/21/2023, 5:25 PM    Dictation software may have been used to generate the above note. Typos may occur and escape review in typed/dictated notes. Please contact Dr Lyn Hollingshead directly for clarity if needed.  Staff may message me via secure chat in Epic  but this may  not receive an immediate response,  please page me for urgent matters!  If 7PM-7AM, please contact night coverage [email or web address]

## 2023-06-21 NOTE — Consult Note (Signed)
Midge Minium, MD [facility]  [address], [address] [place], [place] [address] Phone: [phone] Fax : [phone]  Consultation  Referring Provider:     Dr. Lyn Hollingshead Primary Care Physician:  Jodi Marble, NP Primary Gastroenterologist:  Dr. Timothy Lasso         Reason for Consultation:     Anemia  Date of Admission:  06/20/2023 Date of Consultation:  06/21/2023         HPI:   Stacy Allen is a 82 y.o. female who was admitted to the hospital with symptomatic anemia.  The patient has a history of multiple endoscopic with a upper endoscopy back in 2018 by Dr. Mechele Collin.  At that time she only had gastritis.  In June of this year the patient had a another upper endoscopy by Dr. Timothy Lasso at that time the patient was found to have a gastric polyp and a large hiatal hernia.  The patient then was seen at [facility] in [place] and had an upper endoscopy and colonoscopy back in October of this year which did not show any sign of bleeding but the gastric polyp was removed and the patient was noted again to have a hiatal hernia. 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  The patient also has a history of GERD, DVT and hypothyroidism and was admitted with a a hemoglobin of 6.6 with the most recent hemoglobin 2 months ago being 8.6.  After transfusions the patient was found to have a hemoglobin of 8.2 this morning. The patient reports that she has chronic melena and denies being on any anti-inflammatories and also denies hematemesis.  Past Medical History:  Diagnosis Date   Adenomatous polyps    Allergic state    Anemia    Anginal pain (HCC)    Anxiety    Arthritis    osteoarthritis   Broken rib    left   Cancer (HCC)    skin   CHF (congestive heart failure) (HCC)    Chicken pox    Complication of anesthesia    respiratory distress after rectocele surgery   Coronary artery disease    Depression    Eczema    Fibromyalgia    GERD  (gastroesophageal reflux disease)    Headache    migraines   Hemorrhoids    History of hiatal hernia    Hypercalcemia    Hyperlipidemia    Hypertension    Hypothyroidism    Lumbar stenosis    Obesity    Peptic ulcer disease    Shortness of breath dyspnea    Sleep apnea    No CPAP   Vertigo    Vitamin D deficiency     Past Surgical History:  Procedure Laterality Date   ABDOMINAL HYSTERECTOMY     BACK SURGERY  2007   Dr. Gerrit Heck, [facility], Spinal Fusion   CARDIAC CATHETERIZATION N/A 04/03/2015   Procedure: Left Heart Cath and Coronary Angiography;  Surgeon: Dalia Heading, MD;  Location: [facility] INVASIVE CV LAB;  Service: Cardiovascular;  Laterality: N/A;   CATARACT EXTRACTION W/PHACO Left 04/22/2019   Procedure: CATARACT EXTRACTION PHACO AND INTRAOCULAR LENS PLACEMENT (IOC) LEFT  00:45.4  12.9%  6.03;  Surgeon: Nevada Crane, MD;  Location: [facility] SURGERY CNTR;  Service: Ophthalmology;  Laterality: Left;  Latex sleep apnea   CATARACT EXTRACTION W/PHACO Right 07/01/2019   Procedure: CATARACT EXTRACTION PHACO AND INTRAOCULAR LENS PLACEMENT (IOC) RIGHT 2.38  00:24.4;  Surgeon: Nevada Crane, MD;  Location: [facility] SURGERY CNTR;  Service: Ophthalmology;  Laterality: Right;  Latex Sleep apnea   CHOLECYSTECTOMY     COLONOSCOPY WITH PROPOFOL N/A 04/21/2023   Procedure: COLONOSCOPY WITH PROPOFOL;  Surgeon: Benancio Deeds, MD;  Location: [facility] ENDOSCOPY;  Service: Gastroenterology;  Laterality: N/A;   CORONARY ANGIOPLASTY  2015   Dr. Lady Gary, [facility] Cath Lab   ESOPHAGOGASTRODUODENOSCOPY (EGD) WITH PROPOFOL N/A 12/21/2016   Procedure: ESOPHAGOGASTRODUODENOSCOPY (EGD) WITH PROPOFOL;  Surgeon: Scot Jun, MD;  Location: [facility] ENDOSCOPY;  Service: Endoscopy;  Laterality: N/A;   ESOPHAGOGASTRODUODENOSCOPY (EGD) WITH PROPOFOL N/A 12/08/2022   Procedure: ESOPHAGOGASTRODUODENOSCOPY (EGD) WITH PROPOFOL;  Surgeon: Jaynie Collins, DO;  Location: [facility] ENDOSCOPY;  Service:  Gastroenterology;  Laterality: N/A;   ESOPHAGOGASTRODUODENOSCOPY (EGD) WITH PROPOFOL N/A 04/19/2023   Procedure: ESOPHAGOGASTRODUODENOSCOPY (EGD) WITH PROPOFOL;  Surgeon: Benancio Deeds, MD;  Location: [facility] ENDOSCOPY;  Service: Gastroenterology;  Laterality: N/A;   EYE SURGERY     FRACTURE SURGERY Left 1991   Fractures Femur, [facility]   HEMOSTASIS CLIP PLACEMENT  04/19/2023   Procedure: HEMOSTASIS CLIP PLACEMENT;  Surgeon: Benancio Deeds, MD;  Location: [facility] ENDOSCOPY;  Service: Gastroenterology;;   HIP ARTHROPLASTY Right 05/07/2020   Procedure: ARTHROPLASTY RIGHT HIP (HEMIARTHROPLASTY);  Surgeon: Juanell Fairly, MD;  Location: [facility] ORS;  Service: Orthopedics;  Laterality: Right;   IMAGE GUIDED SINUS SURGERY Bilateral 09/28/2015   Procedure: IMAGE GUIDED SINUS SURGERY, SEPTOPLASTY, BILATERAL FRONTAL SINUSOTOMIES, BILATERAL MAXILLARY ANTROSTOMIES, BILATERAL TOTAL ETHMOIDECTOMY, BILATERAL SPHENOIDECTOMY, BILATERAL INFERIOR TURBINATE REDUCTION;  Surgeon: Vernie Murders, MD;  Location: [facility] ORS;  Service: ENT;  Laterality: Bilateral;   LEFT HEART CATH AND CORONARY ANGIOGRAPHY Left 01/19/2023   Procedure: LEFT HEART CATH AND CORONARY ANGIOGRAPHY;  Surgeon: Marcina Millard, MD;  Location: [facility] INVASIVE CV LAB;  Service: Cardiovascular;  Laterality: Left;   POLYPECTOMY  04/19/2023   Procedure: POLYPECTOMY;  Surgeon: Benancio Deeds, MD;  Location: [facility] ENDOSCOPY;  Service: Gastroenterology;;   POLYPECTOMY  04/21/2023   Procedure: POLYPECTOMY;  Surgeon: Benancio Deeds, MD;  Location: [facility] ENDOSCOPY;  Service: Gastroenterology;;   RECTOCELE REPAIR      Prior to Admission medications   Medication Sig Start Date End Date Taking? Authorizing Provider  acetaminophen (TYLENOL) 500 MG tablet Take 500 mg by mouth every 6 (six) hours as needed.   Yes [provider]  albuterol (PROVENTIL HFA;VENTOLIN HFA) 108 (90 BASE) MCG/ACT inhaler Inhale 2 puffs into the lungs every 6  (six) hours as needed for wheezing or shortness of breath.   Yes [provider]  amLODipine (NORVASC) 10 MG tablet Take 0.5 tablets (5 mg total) by mouth daily. 10/23/21  Yes Lurene Shadow, MD  carboxymethylcellulose (REFRESH PLUS) 0.5 % SOLN Place 1 drop into both eyes daily as needed (dry eyes).   Yes [provider]  citalopram (CELEXA) 20 MG tablet Take 20 mg by mouth daily.   Yes [provider]  clonazePAM (KLONOPIN) 1 MG tablet Take 1 mg by mouth at bedtime.   Yes [provider]  gabapentin (NEURONTIN) 600 MG tablet Take 600-1,200 mg by mouth See admin instructions. Take 1 tablet (600mg ) by mouth in the morning and 2 tablets (1200mg ) by mouth at bedtime 04/24/20  Yes [provider]  levothyroxine (SYNTHROID) 125 MCG tablet Take 1 tablet (125 mcg total) by mouth daily. 04/21/23  Yes Ghimire, Werner Lean, MD  ondansetron (ZOFRAN-ODT) 4 MG disintegrating tablet Take 1 tablet (4 mg total) by mouth every  8 (eight) hours as needed for nausea or vomiting. 04/12/23  Yes White, Adrienne R, NP  tiZANidine (ZANAFLEX) 4 MG tablet Take 4 mg by mouth at bedtime as needed for muscle spasms. Only uses 1/4 of medication 01/23/23  Yes [provider]  trimethoprim (TRIMPEX) 100 MG tablet Take 1 tablet (100 mg total) by mouth daily. 05/15/23  Yes MacDiarmid, Lorin Picket, MD  apixaban (ELIQUIS) 5 MG TABS tablet Take 1 tablet (5 mg total) by mouth 2 (two) times daily. Then 1 tablet (5mg ) twice daily Patient not taking: Reported on 06/08/2023 04/23/23   Maretta Bees, MD  cefUROXime (CEFTIN) 250 MG tablet Take 1 tablet (250 mg total) by mouth 2 (two) times daily with a meal. 06/15/23   McGowan, Carollee Herter A, PA-C  EPINEPHrine 0.3 mg/0.3 mL IJ SOAJ injection Inject 0.3 mg into the muscle once. 04/20/20   [provider]  FEROSUL 325 (65 Fe) MG tablet Take 325 mg by mouth daily as needed (when tolerable). Patient not taking: Reported on 06/20/2023 01/26/23    [provider]  Fluticasone-Salmeterol (ADVAIR HFA IN) Inhale 1 puff into the lungs daily as needed.    [provider]  metoprolol tartrate (LOPRESSOR) 25 MG tablet Take 1 tablet (25 mg total) by mouth daily. Patient not taking: Reported on 06/08/2023 10/23/21   Lurene Shadow, MD  omeprazole (PRILOSEC) 40 MG capsule Take 1 capsule (40 mg total) by mouth 2 (two) times daily before a meal. Patient taking differently: Take 40 mg by mouth daily. 04/21/23   Ghimire, Werner Lean, MD  ranitidine (ZANTAC) 150 MG capsule Take 150 mg by mouth 2 (two) times daily. Patient not taking: Reported on 06/20/2023 06/08/23 06/07/24  [provider]  sucralfate (CARAFATE) 1 GM/10ML suspension Take 10 mLs (1 g total) by mouth 4 (four) times daily -  with meals and at bedtime for 7 days. Patient taking differently: Take 1 g by mouth 4 (four) times daily as needed. 05/02/23     torsemide (DEMADEX) 20 MG tablet Take by mouth. Patient not taking: Reported on 06/20/2023 06/02/23 06/01/24  [provider]  loratadine (CLARITIN) 10 MG tablet Take 1 tablet (10 mg total) by mouth daily. Take 1 tablet in the morning. As needed for itching. 02/14/17 02/17/19  Hassan Rowan, MD    Family History  Problem Relation Age of Onset   Hypertension Mother    Cancer Mother    Diabetes Father    Hypertension Father    Heart failure Father    Breast cancer Neg Hx      Social History   Tobacco Use   Smoking status: Former    Current packs/day: 0.00    Types: Cigarettes    Quit date: 2000    Years since quitting: 25.0    Passive exposure: Past   Smokeless tobacco: Never  Vaping Use   Vaping status: Never Used  Substance Use Topics   Alcohol use: No   Drug use: No    Allergies as of 06/20/2023 - Review Complete 06/20/2023  Allergen Reaction Noted   Azithromycin Diarrhea and Rash 05/03/2011   Meloxicam Diarrhea 04/15/2020   Morphine Other (See Comments) and Rash 05/03/2011   Adhesive  [tape]  12/20/2016   Codeine Nausea And Vomiting 04/02/2015   Lactose intolerance (gi) Other (See Comments) 04/02/2015   Red dye #40 (allura red) Itching 09/22/2015   Shellfish-derived products Itching and Swelling 04/02/2015   Aspirin Rash 03/11/2022   Latex Rash 09/23/2015   Lodine [etodolac]  Rash 04/02/2015   Morphine and codeine Hives and Nausea And Vomiting 05/03/2011   Penicillins Rash and Hives 12/21/2018   Pravastatin Rash 05/06/2019    Review of Systems:    All systems reviewed and negative except where noted in HPI.   Physical Exam:  Vital signs in last 24 hours: Temp:  [97.4 F (36.3 C)-98.8 F (37.1 C)] 98.2 F (36.8 C) (12/25 0744) Pulse Rate:  [65-87] 76 (12/25 0744) Resp:  [15-23] 15 (12/25 0744) BP: (92-152)/(43-90) 145/82 (12/25 0744) SpO2:  [96 %-100 %] 99 % (12/25 0744) Last BM Date :  (PTA) General:   Pleasant, cooperative in NAD Head:  Normocephalic and atraumatic. Eyes:   No icterus.   Conjunctiva pink. PERRLA. Ears:  Normal auditory acuity. Neck:  Supple; no masses or thyroidomegaly Lungs: Respirations even and unlabored. Lungs clear to auscultation bilaterally.   No wheezes, crackles, or rhonchi.  Heart:  Regular rate and rhythm;  Without murmur, clicks, rubs or gallops Abdomen:  Soft, nondistended, nontender. Normal bowel sounds. No appreciable masses or hepatomegaly.  No rebound or guarding.  Rectal:  Not performed. Msk:  Symmetrical without gross deformities.    Extremities:  Without edema, cyanosis or clubbing. Neurologic:  Alert and oriented x3;  grossly normal neurologically. Skin:  Intact without significant lesions or rashes. Cervical Nodes:  No significant cervical adenopathy. Psych:  Alert and cooperative. Normal affect.  LAB RESULTS: Recent Labs    06/20/23 1232 06/21/23 0718  WBC 6.7 4.2  HGB 6.6* 8.2*  HCT 22.8* 26.0*  PLT 305 265   BMET Recent Labs    06/20/23 1232 06/21/23 0718  NA 134* 135  K 3.9 4.3  CL 100 103  CO2  27 25  GLUCOSE 109* 111*  BUN 19 16  CREATININE 0.76 0.75  CALCIUM 8.3* 8.2*   LFT Recent Labs    06/20/23 1232  PROT 6.6  ALBUMIN 3.4*  AST 16  ALT 11  ALKPHOS 66  BILITOT 0.6   PT/INR Recent Labs    06/20/23 1232  LABPROT 14.7  INR 1.1    STUDIES: US Venous Img Lower Bilateral (DVT) Result Date: 06/20/2023 CLINICAL DATA:  History of recurrent deep venous thrombosis. Leg pain, varicose veins, skin malignancy and thyroid hormone therapy. No anticoagulation. EXAM: Bilateral LOWER EXTREMITY VENOUS DOPPLER ULTRASOUND TECHNIQUE: Gray-scale sonography with compression, as well as color and duplex ultrasound, were performed to evaluate the deep venous system(s) from the level of the common femoral vein through the popliteal and proximal calf veins. COMPARISON:  None Available. FINDINGS: VENOUS Normal compressibility of the common femoral, superficial femoral, and left popliteal veins, as well as the visualized calf veins. Visualized portions of profunda femoral vein and great saphenous vein unremarkable. No filling defects to suggest DVT on grayscale or color Doppler imaging. Doppler waveforms show normal direction of venous flow, normal respiratory plasticity and response to augmentation. There is eccentric nonocclusive thrombus demonstrated in the right popliteal vein. Appearance is similar to the previous study likely indicating chronic venous thrombosis. OTHER Bilateral popliteal cysts, right measuring 4.7 x 1.4 x 2.8 cm and left 7 x 2.7 x 2.2 cm. Limitations: none IMPRESSION: 1. Eccentric nonocclusive thrombus in the right popliteal vein similar to prior study likely indicating chronic deep venous thrombosis. 2. Otherwise, no evidence of deep venous thrombosis in the visualized lower extremity veins. 3. Bilateral popliteal cysts. Electronically Signed   By: Burman Nieves M.D.   On: 06/20/2023 18:54      Impression / Plan:  Assessment: Principal Problem:   Anemia   Stacy Allen is a 82 y.o. y/o female with history of iron deficient anemia with a recent workup including an EGD and colonoscopy.  The patient also had a EGD without any sign of bleeding back in June.  The patient now comes in with continued anemia on anticoagulation.  The patient is being evaluated by vascular surgery for possible alternatives or the need to stay on anticoagulation.  Plan:  The patient will be set up for capsule endoscopy for tomorrow.  With the patient's most recent EGD and colonoscopy being in October I do not feel that repeating those tests at this time would be fruitful.  The patient has been explained the plan and agrees with it.  Thank you for involving me in the care of this patient.      LOS: 0 days   Midge Minium, MD, [facility] 06/21/2023, 2:53 PM,  Pager [phone] 7am-5pm  Check AMION for 5pm -7am coverage and on weekends   Note: This dictation was prepared with Dragon dictation along with smaller phrase technology. Any transcriptional errors that result from this process are unintentional.

## 2023-06-22 ENCOUNTER — Encounter (INDEPENDENT_AMBULATORY_CARE_PROVIDER_SITE_OTHER): Payer: Self-pay

## 2023-06-22 ENCOUNTER — Encounter
Admission: EM | Disposition: A | Discharge status: Home / Self Care | Payer: Self-pay | Source: Home / Self Care | Attending: Emergency Medicine

## 2023-06-22 ENCOUNTER — Encounter: Payer: Self-pay | Admitting: Gastroenterology

## 2023-06-22 DIAGNOSIS — D649 Anemia, unspecified: Secondary | ICD-10-CM | POA: Diagnosis not present

## 2023-06-22 DIAGNOSIS — K921 Melena: Secondary | ICD-10-CM | POA: Diagnosis not present

## 2023-06-22 DIAGNOSIS — D500 Iron deficiency anemia secondary to blood loss (chronic): Secondary | ICD-10-CM | POA: Diagnosis not present

## 2023-06-22 HISTORY — PX: GIVENS CAPSULE STUDY: SHX5432

## 2023-06-22 LAB — CBC
HCT: 27 % — ABNORMAL LOW (ref 36.0–46.0)
Hemoglobin: 8.4 g/dL — ABNORMAL LOW (ref 12.0–15.0)
MCH: 25.3 pg — ABNORMAL LOW (ref 26.0–34.0)
MCHC: 31.1 g/dL (ref 30.0–36.0)
MCV: 81.3 fL (ref 80.0–100.0)
Platelets: 272 10*3/uL (ref 150–400)
RBC: 3.32 MIL/uL — ABNORMAL LOW (ref 3.87–5.11)
RDW: 18.2 % — ABNORMAL HIGH (ref 11.5–15.5)
WBC: 4.8 10*3/uL (ref 4.0–10.5)
nRBC: 0 % (ref 0.0–0.2)

## 2023-06-22 SURGERY — GIVENS CAPSULE STUDY

## 2023-06-22 NOTE — Plan of Care (Signed)

## 2023-06-22 NOTE — Plan of Care (Signed)

## 2023-06-22 NOTE — TOC CM/SW Note (Signed)
Transition of Care [facility]) - Inpatient Brief Assessment   Patient Details  Name: Stacy Allen MRN: [id number] Date of Birth: February 28, 1941  Transition of [facility] CM/SW Contact:    Allena Katz, LCSW Phone Number: 06/22/2023, 4:12 PM   Clinical Narrative:    Transition of Care Asessment: Insurance and Status: Insurance coverage has been reviewed Patient has primary care physician: Yes Home environment has been reviewed: adult children Prior level of function:: independent Prior/Current Home Services: No current home services Social Drivers of Health Review: SDOH reviewed no interventions necessary Readmission risk has been reviewed: Yes Transition of care needs: no transition of care needs at this time

## 2023-06-22 NOTE — Progress Notes (Signed)
PROGRESS NOTE    Stacy Allen   WUJ:[id number] DOB: 11/08/1940  DOA: 06/20/2023 Date of Service: 06/22/23 which is hospital day 0  PCP: Jodi Marble, NP    HPI: Stacy Allen is a 82 y.o. 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 Presents to ED 06/20/23 for concerns abnormal labs (anemia) and fatigue. Of note, was admitted to hospital for GI bleed 03/2023. EGD showed large hiatal hernia, benign-appearing gastric polyp-no obvious bleeding source. Colonoscopy showed cecal polyp, internal hemorrhoids, no foci of bleeding. Discharged home. Was off Eliquis following hospitalization. Recently restarted on Eliquis lower dose 2.5 mg bid by vascular surgery 06/08/23 given concern for hx recurrent DVT. Pt was taking this, noted darker stool, stopped the Eliquis 06/18/23 (2 days prior to presentation to ED - last dose she thinks Sat night or Sun morning)    Hospital course / significant events:  12/24: to ED, Hgb 6.6. 2 units PRBC ordered by EDP. Admitted to hospitalist service.  12/25: Hgb 8.2. GI consult --> no benefit to repeat scope, recs for capsule endoscopy tomorrow. 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 but complicated by GIB, large hiatal hernia, GERD Chronic iron deficiency anemia Note last dose eliquis about 2 days ago she thinks AM 12/22 Protonix IV 40 mg bid  CLD -->  regular diet this afternoon GI consult --> capsule endoscopy  No anticoagulation Monitor HH  Hx provoked DVT - "recurrent DVT" is on record but only noted clot dx appears to be following hip surgery in 2021  Per vascular, clot likely endothelialized and will continue to show up on imaging as "chronic DVT" D/c anticoagulation  Hypercoagulable workup recommended by vascular surgery for completeness - if this reveals concern for high risk clot, would consider IVC filter  DOAC can impact the accuracy of testing, it is recommended to wait 2 weeks following discontinuation of anticoagulation, follow outpatient to consider testing   CAD s/p PCI HTN Statin intolerance, ASA intolerance  Amlodipine at home - holding  Not on BB, ACE/ARB at home Hydralazine prn HTN   UTI - recent 06/15/2023 Ecoli pansensitive  Cefuroxime started 12/19 Continue omnicef (cefuroxime not on formulary)  Hypothyroid Continue home levothyroxine 150 mcg dose confirmed w/ patient  Anxiety/depression Continue Celexa, clonazepam  Neuropathic pain Continue home gabapentin   Class 3 based on BMI: Body mass index is 40.92 kg/m.  Underweight - under 18  overweight - 25 to 29 obese - 30 or more Class 1 obesity: BMI of 30.0 to 34 Class 2 obesity: BMI of 35.0 to 39 Class 3 obesity: BMI of 40.0 to 49 Super Morbid Obesity: BMI 50-59 Super-super Morbid Obesity: BMI 60+ Significantly low or high BMI is associated with higher medical risk.  Weight management advised as adjunct to other disease management and risk reduction treatments   DVT prophylaxis: SCD IV fluids: no continuous IV fluids  Nutrition: CLD Central lines / invasive devices: none  Code Status: DNR confirmed w/ patient on admission by Dr Lyn Hollingshead  ACP documentation reviewed: DNR form is on file in [facility]  [facility] needs: none Barriers to dispo / significant pending items: capsule endoscopy / GI clearance              Subjective / Brief ROS:   Patient reports feeling okay today, no major concerns  Denies CP/SOB.  Pain controlled.  Tolerating clears.  Reports no concerns w/ urination/defecation.   Family Communication: none at this time    Objective Findings:  Vitals:   06/21/23 1848 06/21/23 1945 06/22/23 0440 06/22/23 0900  BP: 135/69 (!) 146/71 136/73 (!) 169/96  Pulse: 81 75 88 89  Resp: 16 20 18 18   Temp: 98 F (36.7 C) 98 F (36.7 C) 97.8 F (36.6 C) 98 F (36.7 C)  TempSrc: Oral Oral Oral Oral  SpO2: 98% 98% 95% 98%  Weight:      Height:        Intake/Output Summary (Last 24 hours) at 06/22/2023 1408 Last data filed at 06/22/2023 1000 Gross per 24 hour  Intake 240 ml  Output --  Net 240 ml   Filed Weights   06/20/23 1231  Weight: 104.8 kg    Examination:  Physical Exam Constitutional:      General: She is not in acute distress. Cardiovascular:     Rate and Rhythm: Normal rate and regular rhythm.  Pulmonary:     Effort: Pulmonary effort is normal.     Breath sounds: Normal breath sounds.  Skin:    General: Skin is warm and dry.  Neurological:     General: No focal deficit present.     Mental Status: She is alert and oriented to person, place, and time.  Psychiatric:        Mood and Affect: Mood normal.        Behavior: Behavior normal.          Scheduled Medications:   cefdinir  300 mg Oral Q12H   citalopram  20 mg Oral Daily   clonazePAM  1 mg Oral QHS   gabapentin  1,200 mg Oral QHS   gabapentin  600 mg Oral Daily   levothyroxine  150 mcg Oral Q0600   pantoprazole (PROTONIX) IV  40 mg Intravenous Q12H    Continuous Infusions:   PRN Medications:  acetaminophen **OR** acetaminophen, bisacodyl, hydrALAZINE, ondansetron **OR** ondansetron (ZOFRAN) IV, oxyCODONE, polyethylene glycol, tiZANidine  Antimicrobials from admission:  Anti-infectives (From admission, onward)    Start     Dose/Rate Route Frequency Ordered Stop   06/20/23 2200  cefdinir (OMNICEF) capsule 300 mg         300 mg Oral Every 12 hours 06/20/23 1637             Data Reviewed:  I have personally reviewed the following...  CBC: Recent Labs  Lab 06/20/23 1232 06/21/23 0718 06/22/23 0442  WBC 6.7 4.2 4.8  HGB 6.6* 8.2* 8.4*  HCT 22.8* 26.0* 27.0*  MCV 83.2 80.5 81.3  PLT 305 265 272   Basic Metabolic Panel: Recent Labs  Lab 06/20/23 1232 06/21/23 0718  NA 134* 135  K 3.9 4.3  CL 100 103  CO2 27 25  GLUCOSE 109* 111*  BUN 19 16  CREATININE 0.76 0.75  CALCIUM 8.3* 8.2*   GFR: Estimated Creatinine Clearance: 63.9 mL/min (by C-G formula based on SCr of 0.75 mg/dL). Liver Function Tests: Recent Labs  Lab 06/20/23 1232  AST 16  ALT 11  ALKPHOS 66  BILITOT 0.6  PROT 6.6  ALBUMIN 3.4*   No results for input(s): "LIPASE", "AMYLASE" in the last 168 hours. No results for input(s): "AMMONIA" in the last 168 hours. Coagulation Profile: Recent Labs  Lab 06/20/23 1232  INR 1.1   Cardiac Enzymes: No results for input(s): "CKTOTAL", "CKMB", "CKMBINDEX", "TROPONINI" in the last 168 hours. BNP (last 3 results) No results for input(s): "PROBNP" in the last 8760 hours. HbA1C: No results for input(s): "HGBA1C" in the last 72 hours. CBG: No results for input(s): "GLUCAP" in the last 168 hours. Lipid Profile: No results for input(s): "CHOL", "HDL", "LDLCALC", "TRIG", "CHOLHDL", "LDLDIRECT" in the last 72 hours. Thyroid Function Tests: No results for input(s): "TSH", "T4TOTAL", "FREET4", "T3FREE", "THYROIDAB" in the last 72 hours. Anemia Panel: No results for input(s): "VITAMINB12", "FOLATE", "FERRITIN", "TIBC", "IRON", "RETICCTPCT" in the last 72 hours. Most Recent Urinalysis On File:     Component Value Date/Time   COLORURINE STRAW (A) 04/17/2023 1808   APPEARANCEUR Hazy (A) 06/15/2023 1124   LABSPEC 1.010 04/17/2023 1808   LABSPEC 1.005 07/23/2014 1444   PHURINE 7.0 04/17/2023 1808   GLUCOSEU Negative 06/15/2023 1124   GLUCOSEU NEGATIVE 07/23/2014 1444    HGBUR NEGATIVE 04/17/2023 1808   BILIRUBINUR Negative 06/15/2023 1124   BILIRUBINUR NEGATIVE 07/23/2014 1444   KETONESUR NEGATIVE 04/17/2023 1808   PROTEINUR Negative 06/15/2023 1124   PROTEINUR NEGATIVE 04/17/2023 1808   NITRITE Positive (A) 06/15/2023 1124   NITRITE NEGATIVE 04/17/2023 1808   LEUKOCYTESUR 1+ (A) 06/15/2023 1124   LEUKOCYTESUR NEGATIVE 04/17/2023 1808   LEUKOCYTESUR NEGATIVE 07/23/2014 1444   Sepsis Labs: @LABRCNTIP (procalcitonin:4,lacticidven:4) Microbiology: Recent Results (from the past 240 hours)  CULTURE, URINE COMPREHENSIVE     Status: Abnormal   Collection Time: 06/15/23 11:24 AM   Specimen: Urine   UR  Result Value Ref Range Status   Urine Culture, Comprehensive Final report (A)  Final   Organism ID, Bacteria Escherichia coli (A)  Final    Comment: Cefazolin with an MIC <=16 predicts susceptibility to the oral agents cefaclor, cefdinir, cefpodoxime, cefprozil, cefuroxime, cephalexin, and loracarbef when used for therapy of uncomplicated urinary tract infections due to E. coli, Klebsiella pneumoniae, and Proteus mirabilis. Multi-Drug Resistant Organism Greater than 100,000 colony forming units per mL    ANTIMICROBIAL SUSCEPTIBILITY Comment  Final    Comment:       ** S = Susceptible; I = Intermediate; R = Resistant **                    P = Positive; N = Negative             MICS are expressed in micrograms per mL    Antibiotic                 RSLT#1    RSLT#2    RSLT#3    RSLT#4 Amoxicillin/Clavulanic Acid    I Ampicillin                     R Cefazolin                      S Cefepime                       S Ceftriaxone                    S Cefuroxime  S Ciprofloxacin                  S Ertapenem                      S Gentamicin                     S Imipenem                       S Levofloxacin                   S Meropenem                      S Nitrofurantoin                 S Piperacillin/Tazobactam         S Tetracycline                   R Tobramycin                     S Trimethoprim/Sulfa             R   Microscopic Examination     Status: Abnormal   Collection Time: 06/15/23 11:24 AM   Urine  Result Value Ref Range Status   WBC, UA >30 (A) 0 - 5 /hpf Final   RBC, Urine 0-2 0 - 2 /hpf Final   Epithelial Cells (non renal) 0-10 0 - 10 /hpf Final   Mucus, UA Present (A) Not Estab. Final   Bacteria, UA Many (A) None seen/Few Final      Radiology Studies last 3 days: US Venous Img Lower Bilateral (DVT) Result Date: 06/20/2023 CLINICAL DATA:  History of recurrent deep venous thrombosis. Leg pain, varicose veins, skin malignancy and thyroid hormone therapy. No anticoagulation. EXAM: Bilateral LOWER EXTREMITY VENOUS DOPPLER ULTRASOUND TECHNIQUE: Gray-scale sonography with compression, as well as color and duplex ultrasound, were performed to evaluate the deep venous system(s) from the level of the common femoral vein through the popliteal and proximal calf veins. COMPARISON:  None Available. FINDINGS: VENOUS Normal compressibility of the common femoral, superficial femoral, and left popliteal veins, as well as the visualized calf veins. Visualized portions of profunda femoral vein and great saphenous vein unremarkable. No filling defects to suggest DVT on grayscale or color Doppler imaging. Doppler waveforms show normal direction of venous flow, normal respiratory plasticity and response to augmentation. There is eccentric nonocclusive thrombus demonstrated in the right popliteal vein. Appearance is similar to the previous study likely indicating chronic venous thrombosis. OTHER Bilateral popliteal cysts, right measuring 4.7 x 1.4 x 2.8 cm and left 7 x 2.7 x 2.2 cm. Limitations: none IMPRESSION: 1. Eccentric nonocclusive thrombus in the right popliteal vein similar to prior study likely indicating chronic deep venous thrombosis. 2. Otherwise, no evidence of deep venous thrombosis in the visualized lower  extremity veins. 3. Bilateral popliteal cysts. Electronically Signed   By: Burman Nieves M.D.   On: 06/20/2023 18:54           Sunnie Nielsen, DO [facility] 06/22/2023, 2:08 PM    Dictation software may have been used to generate the above note. Typos may occur and escape review in typed/dictated notes. Please contact Dr Lyn Hollingshead directly for clarity if needed.  Staff may message me via secure chat in Epic  but this may  not receive an immediate response,  please page me for urgent matters!  If 7PM-7AM, please contact night coverage [email or web address]

## 2023-06-22 NOTE — Care Management Obs Status (Signed)
MEDICARE OBSERVATION STATUS NOTIFICATION   Patient Details  Name: Stacy Allen MRN: [id number] Date of Birth: 03-03-1941   Medicare Observation Status Notification Given:  Yes    Imelda Dandridge, LCSW 06/22/2023, 2:01 PM

## 2023-06-22 NOTE — Progress Notes (Signed)
Midge Minium, MD [facility]   [address], [address] [place], [place] [address] Phone: [phone] Fax : [phone]   Subjective: The patient reports doing well overnight without any complaints.  The patient's hemoglobin has been stable.  The hemoglobin yesterday was 8.2 and today was 8.4.  The patient has had anticoagulation held.  There is no report of any nausea vomiting black stools or bloody stools.   Objective: Vital signs in last 24 hours: Vitals:   06/21/23 1848 06/21/23 1945 06/22/23 0440 06/22/23 0900  BP: 135/69 (!) 146/71 136/73 (!) 169/96  Pulse: 81 75 88 89  Resp: 16 20 18 18   Temp: 98 F (36.7 C) 98 F (36.7 C) 97.8 F (36.6 C) 98 F (36.7 C)  TempSrc: Oral Oral Oral Oral  SpO2: 98% 98% 95% 98%  Weight:      Height:       Weight change:   Intake/Output Summary (Last 24 hours) at 06/22/2023 1135 Last data filed at 06/22/2023 1000 Gross per 24 hour  Intake 480 ml  Output --  Net 480 ml     Exam: Heart:: Regular rate and rhythm, S1S2 present, or without murmur or extra heart sounds Lungs: normal and clear to auscultation and percussion Abdomen: soft, nontender, normal bowel sounds   Lab Results: @LABTEST2 @ Micro Results: Recent Results (from the past 240 hours)  CULTURE, URINE COMPREHENSIVE     Status: Abnormal   Collection Time: 06/15/23 11:24 AM   Specimen: Urine   UR  Result Value Ref Range Status   Urine Culture, Comprehensive Final report (A)  Final   Organism ID, Bacteria Escherichia coli (A)  Final    Comment: Cefazolin with an MIC <=16 predicts susceptibility to the oral agents cefaclor, cefdinir, cefpodoxime, cefprozil, cefuroxime, cephalexin, and loracarbef when used for therapy of uncomplicated urinary tract infections due to E. coli, Klebsiella pneumoniae, and Proteus mirabilis. Multi-Drug Resistant Organism Greater than 100,000 colony forming units per mL    ANTIMICROBIAL SUSCEPTIBILITY Comment  Final    Comment:       ** S =  Susceptible; I = Intermediate; R = Resistant **                    P = Positive; N = Negative             MICS are expressed in micrograms per mL    Antibiotic                 RSLT#1    RSLT#2    RSLT#3    RSLT#4 Amoxicillin/Clavulanic Acid    I Ampicillin                     R Cefazolin                      S Cefepime                       S Ceftriaxone                    S Cefuroxime                     S Ciprofloxacin                  S Ertapenem  S Gentamicin                     S Imipenem                       S Levofloxacin                   S Meropenem                      S Nitrofurantoin                 S Piperacillin/Tazobactam        S Tetracycline                   R Tobramycin                     S Trimethoprim/Sulfa             R   Microscopic Examination     Status: Abnormal   Collection Time: 06/15/23 11:24 AM   Urine  Result Value Ref Range Status   WBC, UA >30 (A) 0 - 5 /hpf Final   RBC, Urine 0-2 0 - 2 /hpf Final   Epithelial Cells (non renal) 0-10 0 - 10 /hpf Final   Mucus, UA Present (A) Not Estab. Final   Bacteria, UA Many (A) None seen/Few Final   Studies/Results: US Venous Img Lower Bilateral (DVT) Result Date: 06/20/2023 CLINICAL DATA:  History of recurrent deep venous thrombosis. Leg pain, varicose veins, skin malignancy and thyroid hormone therapy. No anticoagulation. EXAM: Bilateral LOWER EXTREMITY VENOUS DOPPLER ULTRASOUND TECHNIQUE: Gray-scale sonography with compression, as well as color and duplex ultrasound, were performed to evaluate the deep venous system(s) from the level of the common femoral vein through the popliteal and proximal calf veins. COMPARISON:  None Available. FINDINGS: VENOUS Normal compressibility of the common femoral, superficial femoral, and left popliteal veins, as well as the visualized calf veins. Visualized portions of profunda femoral vein and great saphenous vein unremarkable. No filling defects to  suggest DVT on grayscale or color Doppler imaging. Doppler waveforms show normal direction of venous flow, normal respiratory plasticity and response to augmentation. There is eccentric nonocclusive thrombus demonstrated in the right popliteal vein. Appearance is similar to the previous study likely indicating chronic venous thrombosis. OTHER Bilateral popliteal cysts, right measuring 4.7 x 1.4 x 2.8 cm and left 7 x 2.7 x 2.2 cm. Limitations: none IMPRESSION: 1. Eccentric nonocclusive thrombus in the right popliteal vein similar to prior study likely indicating chronic deep venous thrombosis. 2. Otherwise, no evidence of deep venous thrombosis in the visualized lower extremity veins. 3. Bilateral popliteal cysts. Electronically Signed   By: Burman Nieves M.D.   On: 06/20/2023 18:54   Medications: I have reviewed the patient's current medications. Scheduled Meds:  cefdinir  300 mg Oral Q12H   citalopram  20 mg Oral Daily   clonazePAM  1 mg Oral QHS   gabapentin  1,200 mg Oral QHS   gabapentin  600 mg Oral Daily   levothyroxine  150 mcg Oral Q0600   pantoprazole (PROTONIX) IV  40 mg Intravenous Q12H   Continuous Infusions: PRN Meds:.acetaminophen **OR** acetaminophen, bisacodyl, hydrALAZINE, ondansetron **OR** ondansetron (ZOFRAN) IV, oxyCODONE, polyethylene glycol, tiZANidine   Assessment: Principal Problem:   Anemia    Plan: The patient had a EGD and colonoscopy within the last few months for anemia  without any source found.  The patient continues to have dark stools.  She had a capsule placed today and the recorder will be picked up tonight and downloaded tomorrow.  Subsequent to that the patient will have the video reviewed.  As of 3:30 pm today the patient can be put on a regular diet.  The patient has been explained the plan and agrees with it.   LOS: 0 days   Sherlyn Hay 06/22/2023, 11:35 AM Pager [phone] 7am-5pm  Check AMION for 5pm -7am coverage and on weekends

## 2023-06-23 DIAGNOSIS — D62 Acute posthemorrhagic anemia: Secondary | ICD-10-CM | POA: Diagnosis not present

## 2023-06-23 LAB — TYPE AND SCREEN
ABO/RH(D): A POS
Antibody Screen: NEGATIVE
Unit division: 0
Unit division: 0
Unit division: 0

## 2023-06-23 LAB — BPAM RBC
Blood Product Expiration Date: 202412302359
Blood Product Expiration Date: 202501252359
Blood Product Expiration Date: 202501252359
ISSUE DATE / TIME: 202412241650
ISSUE DATE / TIME: 202412241948
Unit Type and Rh: 600
Unit Type and Rh: 6200
Unit Type and Rh: 6200

## 2023-06-23 LAB — HEMOGLOBIN AND HEMATOCRIT, BLOOD
HCT: 26.3 % — ABNORMAL LOW (ref 36.0–46.0)
Hemoglobin: 8 g/dL — ABNORMAL LOW (ref 12.0–15.0)

## 2023-06-23 MED ORDER — SODIUM CHLORIDE 0.9% IV SOLUTION: Freq: Once | INTRAVENOUS | Status: DC

## 2023-06-23 MED ORDER — LEVOTHYROXINE SODIUM 150 MCG PO TABS: 150.0000 ug | ORAL_TABLET | Freq: Every day | ORAL | Status: DC

## 2023-06-23 MED ORDER — MOMETASONE FURO-FORMOTEROL FUM 100-5 MCG/ACT IN AERO
2.0000 | INHALATION_SPRAY | Freq: Two times a day (BID) | RESPIRATORY_TRACT | Status: DC
  Administered 2023-06-23: 2 via RESPIRATORY_TRACT
  Filled 2023-06-23: qty 8.8

## 2023-06-23 MED ORDER — OMEPRAZOLE 40 MG PO CPDR: 40.0000 mg | DELAYED_RELEASE_CAPSULE | Freq: Two times a day (BID) | ORAL | 1 refills | Status: AC

## 2023-06-23 MED ORDER — ALBUTEROL SULFATE (2.5 MG/3ML) 0.083% IN NEBU: 2.5000 mg | INHALATION_SOLUTION | RESPIRATORY_TRACT | Status: DC | PRN

## 2023-06-23 NOTE — Plan of Care (Signed)

## 2023-06-23 NOTE — Discharge Summary (Signed)
Physician Discharge Summary   Patient: Stacy Allen MRN: [id number]  DOB: June 28, 1940   Admit:     Date of Admission: 06/20/2023 Admitted from: home   Discharge: Date of discharge: 06/23/23 Disposition: Home Condition at discharge: good  CODE STATUS: FULL CODE     Discharge Physician: Sunnie Nielsen, DO [facility]     PCP: Jodi Marble, NP  Recommendations for Outpatient Follow-up:  Follow up with PCP Jodi Marble, NP in 1-2 weeks Ideally will get CBC next week  Consider hypercoagulable workup in 2 weeks but suspect this may not yield helpful results given hx single provoked DVT    Discharge Instructions     Diet general   Complete by: As directed    Increase activity slowly   Complete by: As directed          Discharge Diagnoses: Principal Problem:   Anemia          Hospital course / significant events:  HPI: Stacy Allen is a 82 y.o. 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 Presents to ED 06/20/23 for concerns abnormal labs (anemia) and fatigue. Of note, was admitted to hospital for GI bleed 03/2023. EGD showed large hiatal hernia, benign-appearing gastric polyp-no obvious bleeding source. Colonoscopy showed cecal polyp, internal hemorrhoids, no foci of bleeding. Discharged home. Was off Eliquis following hospitalization. Recently restarted on Eliquis lower dose 2.5 mg bid by vascular surgery 06/08/23 given concern for hx recurrent DVT. Pt was taking this, noted darker stool, stopped the Eliquis 06/18/23 (2 days prior to presentation to ED - last dose she thinks Sat night or Sun morning)   12/24: to ED, Hgb 6.6. 2 units PRBC ordered by EDP. Admitted to hospitalist service.  12/25: Hgb 8.2. GI consult --> no benefit to repeat scope, recs for capsule endoscopy tomorrow. (+)chronic DVT still remains in R popliteal - vascular  consult --> seems likely a provoked DVT following hip fracture in 2021, DVT likely endothelialized and is showing up on imaging as chronic DVT, recs d/c anticoagulation and obtain hypercoagulable w/u and if concern for high risk thrombosis then would consider IVC filter 12/26: capsule endoscopy placed today  12/27: capsule negative per Dr Servando Snare. 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 Per vascular, clot likely endothelialized and will continue to show up on imaging as "chronic DVT" D/c anticoagulation and NSAIDs Hypercoagulable workup recommended by vascular surgery for completeness - if this reveals concern for high risk clot, would consider IVC filter. DOAC can impact the accuracy of testing, it is recommended to wait 2 weeks following discontinuation of anticoagulation, follow outpatient to consider testing   CAD s/p PCI HTN Statin intolerance, ASA intolerance  Amlodipine at home - holding d/t soft BP, follow w/ PCP  Not on BB, ACE/ARB at home Hydralazine prn HTN   UTI - recent 06/15/2023 Ecoli pansensitive  Cefuroxime to finihs course  Hypothyroid Continue home levothyroxine 150 mcg dose confirmed w/ patient  Anxiety/depression Continue Celexa, clonazepam  Neuropathic pain Continue home gabapentin   Class 3 based on BMI: Body mass index is 40.92 kg/m.  Significantly low or high BMI is associated with higher medical risk.  Weight management advised as adjunct to other disease management and risk reduction  treatments             Discharge Instructions  Allergies as of 06/23/2023       Reactions   Azithromycin Diarrhea, Rash   "fainting" Other reaction(s): Syncope  "fainting"    Other reaction(s): NOT KNOWN  "fainting"   Meloxicam Diarrhea   Morphine Other (See Comments), Rash   Other reaction(s): Unknown Retain urine Other reaction(s): NOT KNOWN, Other (See Comments) Retain urine Other reaction(s): side effect of severe rash   Adhesive [tape]    blisters   Codeine Nausea And Vomiting   Lactose Intolerance (gi) Other (See Comments)   "stomach pain"   Red Dye #40 (allura Red) Itching   "red food dye"   Shellfish-derived Products Itching, Swelling   Aspirin Rash   Latex Rash   With extended contact   Lodine [etodolac] Rash   Morphine And Codeine Hives, Nausea And Vomiting   Other reaction(s): UNKNOWN   Penicillins Rash, Hives   Given on 05/07/20.  No reaction noted.   Pravastatin Rash        Medication List     STOP taking these medications    amLODipine 10 MG tablet Commonly known as: NORVASC   apixaban 5 MG Tabs tablet Commonly known as: Eliquis   metoprolol tartrate 25 MG tablet Commonly known as: LOPRESSOR   ranitidine 150 MG capsule Commonly known as: ZANTAC   sucralfate 1 GM/10ML suspension Commonly known as: CARAFATE   torsemide 20 MG tablet Commonly known as: DEMADEX       TAKE these medications    acetaminophen 500 MG tablet Commonly known as: TYLENOL Take 500 mg by mouth every 6 (six) hours as needed.   ADVAIR HFA IN Inhale 1 puff into the lungs daily as needed.   albuterol 108 (90 Base) MCG/ACT inhaler Commonly known as: VENTOLIN HFA Inhale 2 puffs into the lungs every 6 (six) hours as needed for wheezing or shortness of breath.   carboxymethylcellulose 0.5 % Soln Commonly known as: REFRESH PLUS Place 1 drop into both eyes daily as needed (dry eyes).   cefUROXime 250 MG tablet Commonly known as: CEFTIN Take 1  tablet (250 mg total) by mouth 2 (two) times daily with a meal.   citalopram 20 MG tablet Commonly known as: CELEXA Take 20 mg by mouth daily.   clonazePAM 1 MG tablet Commonly known as: KLONOPIN Take 1 mg by mouth at bedtime.   EPINEPHrine 0.3 mg/0.3 mL Soaj injection Commonly known as: EPI-PEN Inject 0.3 mg into the muscle once.   FeroSul 325 (65 Fe) MG tablet Generic drug: ferrous sulfate Take 325 mg by mouth daily as needed (when tolerable).   gabapentin 600 MG tablet Commonly known as: NEURONTIN Take 600-1,200 mg by mouth See admin instructions. Take 1 tablet (600mg ) by mouth in the morning and 2 tablets (1200mg ) by mouth at bedtime   levothyroxine 150 MCG tablet Commonly known as: SYNTHROID Take 1 tablet (150 mcg total) by mouth daily. What changed:  medication strength how much to take   omeprazole 40 MG capsule Commonly known as: PRILOSEC Take 1 capsule (40 mg total) by mouth 2 (two) times daily before a meal. What changed: when to take this  ondansetron 4 MG disintegrating tablet Commonly known as: ZOFRAN-ODT Take 1 tablet (4 mg total) by mouth every 8 (eight) hours as needed for nausea or vomiting.   tiZANidine 4 MG tablet Commonly known as: ZANAFLEX Take 4 mg by mouth at bedtime as needed for muscle spasms. Only uses 1/4 of medication   trimethoprim 100 MG tablet Commonly known as: TRIMPEX Take 1 tablet (100 mg total) by mouth daily.          Allergies  Allergen Reactions   Azithromycin Diarrhea and Rash    "fainting"  Other reaction(s): Syncope  "fainting"    Other reaction(s): NOT KNOWN  "fainting"   Meloxicam Diarrhea   Morphine Other (See Comments) and Rash    Other reaction(s): Unknown Retain urine Other reaction(s): NOT KNOWN, Other (See Comments) Retain urine Other reaction(s): side effect of severe rash   Adhesive [Tape]     blisters   Codeine Nausea And Vomiting   Lactose Intolerance (Gi) Other (See Comments)    "stomach  pain"   Red Dye #40 (Allura Red) Itching    "red food dye"   Shellfish-Derived Products Itching and Swelling   Aspirin Rash   Latex Rash    With extended contact   Lodine [Etodolac] Rash   Morphine And Codeine Hives and Nausea And Vomiting    Other reaction(s): UNKNOWN   Penicillins Rash and Hives    Given on 05/07/20.  No reaction noted.   Pravastatin Rash     Subjective: pt reports some fatigue but overall better today. Tolerating diet, no CP/SOB at rest, notes some SOB w/ exertion but no syncope/presyncope.    Discharge Exam: BP 138/89 (BP Location: Right Arm)   Pulse 86   Temp 98 F (36.7 C) (Oral)   Resp 18   Ht 5\' 3"  (1.6 m)   Wt 104.8 kg   SpO2 98%   BMI 40.92 kg/m  General: Pt is alert, awake, not in acute distress Cardiovascular: RRR, S1/S2 +, no rubs, no gallops Respiratory: CTA bilaterally, no wheezing, no rhonchi Abdominal: Soft, NT, ND, bowel sounds + Extremities: no edema, no cyanosis     The results of significant diagnostics from this hospitalization (including imaging, microbiology, ancillary and laboratory) are listed below for reference.     Microbiology: Recent Results (from the past 240 hours)  CULTURE, URINE COMPREHENSIVE     Status: Abnormal   Collection Time: 06/15/23 11:24 AM   Specimen: Urine   UR  Result Value Ref Range Status   Urine Culture, Comprehensive Final report (A)  Final   Organism ID, Bacteria Escherichia coli (A)  Final    Comment: Cefazolin with an MIC <=16 predicts susceptibility to the oral agents cefaclor, cefdinir, cefpodoxime, cefprozil, cefuroxime, cephalexin, and loracarbef when used for therapy of uncomplicated urinary tract infections due to E. coli, Klebsiella pneumoniae, and Proteus mirabilis. Multi-Drug Resistant Organism Greater than 100,000 colony forming units per mL    ANTIMICROBIAL SUSCEPTIBILITY Comment  Final    Comment:       ** S = Susceptible; I = Intermediate; R = Resistant **                     P = Positive; N = Negative             MICS are expressed in micrograms per mL    Antibiotic                 RSLT#1    RSLT#2  RSLT#3    RSLT#4 Amoxicillin/Clavulanic Acid    I Ampicillin                     R Cefazolin                      S Cefepime                       S Ceftriaxone                    S Cefuroxime                     S Ciprofloxacin                  S Ertapenem                      S Gentamicin                     S Imipenem                       S Levofloxacin                   S Meropenem                      S Nitrofurantoin                 S Piperacillin/Tazobactam        S Tetracycline                   R Tobramycin                     S Trimethoprim/Sulfa             R   Microscopic Examination     Status: Abnormal   Collection Time: 06/15/23 11:24 AM   Urine  Result Value Ref Range Status   WBC, UA >30 (A) 0 - 5 /hpf Final   RBC, Urine 0-2 0 - 2 /hpf Final   Epithelial Cells (non renal) 0-10 0 - 10 /hpf Final   Mucus, UA Present (A) Not Estab. Final   Bacteria, UA Many (A) None seen/Few Final     Labs: BNP (last 3 results) Recent Labs    04/17/23 1733 04/18/23 1739  BNP 339.5* 295.2*   Basic Metabolic Panel: Recent Labs  Lab 06/20/23 1232 06/21/23 0718  NA 134* 135  K 3.9 4.3  CL 100 103  CO2 27 25  GLUCOSE 109* 111*  BUN 19 16  CREATININE 0.76 0.75  CALCIUM 8.3* 8.2*   Liver Function Tests: Recent Labs  Lab 06/20/23 1232  AST 16  ALT 11  ALKPHOS 66  BILITOT 0.6  PROT 6.6  ALBUMIN 3.4*   No results for input(s): "LIPASE", "AMYLASE" in the last 168 hours. No results for input(s): "AMMONIA" in the last 168 hours. CBC: Recent Labs  Lab 06/20/23 1232 06/21/23 0718 06/22/23 0442 06/23/23 0517  WBC 6.7 4.2 4.8  --   HGB 6.6* 8.2* 8.4* 8.0*  HCT 22.8* 26.0* 27.0* 26.3*  MCV 83.2 80.5 81.3  --   PLT 305 265 272  --    Cardiac Enzymes: No results for input(s): "CKTOTAL", "CKMB", "CKMBINDEX", "TROPONINI" in the last  168 hours.  BNP: Invalid input(s): "POCBNP" CBG: No results for input(s): "GLUCAP" in the last 168 hours. D-Dimer No results for input(s): "DDIMER" in the last 72 hours. Hgb A1c No results for input(s): "HGBA1C" in the last 72 hours. Lipid Profile No results for input(s): "CHOL", "HDL", "LDLCALC", "TRIG", "CHOLHDL", "LDLDIRECT" in the last 72 hours. Thyroid function studies No results for input(s): "TSH", "T4TOTAL", "T3FREE", "THYROIDAB" in the last 72 hours.  Invalid input(s): "FREET3" Anemia work up No results for input(s): "VITAMINB12", "FOLATE", "FERRITIN", "TIBC", "IRON", "RETICCTPCT" in the last 72 hours. Urinalysis    Component Value Date/Time   COLORURINE STRAW (A) 04/17/2023 1808   APPEARANCEUR Hazy (A) 06/15/2023 1124   LABSPEC 1.010 04/17/2023 1808   LABSPEC 1.005 07/23/2014 1444   PHURINE 7.0 04/17/2023 1808   GLUCOSEU Negative 06/15/2023 1124   GLUCOSEU NEGATIVE 07/23/2014 1444   HGBUR NEGATIVE 04/17/2023 1808   BILIRUBINUR Negative 06/15/2023 1124   BILIRUBINUR NEGATIVE 07/23/2014 1444   KETONESUR NEGATIVE 04/17/2023 1808   PROTEINUR Negative 06/15/2023 1124   PROTEINUR NEGATIVE 04/17/2023 1808   NITRITE Positive (A) 06/15/2023 1124   NITRITE NEGATIVE 04/17/2023 1808   LEUKOCYTESUR 1+ (A) 06/15/2023 1124   LEUKOCYTESUR NEGATIVE 04/17/2023 1808   LEUKOCYTESUR NEGATIVE 07/23/2014 1444   Sepsis Labs Recent Labs  Lab 06/20/23 1232 06/21/23 0718 06/22/23 0442  WBC 6.7 4.2 4.8   Microbiology Recent Results (from the past 240 hours)  CULTURE, URINE COMPREHENSIVE     Status: Abnormal   Collection Time: 06/15/23 11:24 AM   Specimen: Urine   UR  Result Value Ref Range Status   Urine Culture, Comprehensive Final report (A)  Final   Organism ID, Bacteria Escherichia coli (A)  Final    Comment: Cefazolin with an MIC <=16 predicts susceptibility to the oral agents cefaclor, cefdinir, cefpodoxime, cefprozil, cefuroxime, cephalexin, and loracarbef when used for  therapy of uncomplicated urinary tract infections due to E. coli, Klebsiella pneumoniae, and Proteus mirabilis. Multi-Drug Resistant Organism Greater than 100,000 colony forming units per mL    ANTIMICROBIAL SUSCEPTIBILITY Comment  Final    Comment:       ** S = Susceptible; I = Intermediate; R = Resistant **                    P = Positive; N = Negative             MICS are expressed in micrograms per mL    Antibiotic                 RSLT#1    RSLT#2    RSLT#3    RSLT#4 Amoxicillin/Clavulanic Acid    I Ampicillin                     R Cefazolin                      S Cefepime                       S Ceftriaxone                    S Cefuroxime                     S Ciprofloxacin                  S Ertapenem  S Gentamicin                     S Imipenem                       S Levofloxacin                   S Meropenem                      S Nitrofurantoin                 S Piperacillin/Tazobactam        S Tetracycline                   R Tobramycin                     S Trimethoprim/Sulfa             R   Microscopic Examination     Status: Abnormal   Collection Time: 06/15/23 11:24 AM   Urine  Result Value Ref Range Status   WBC, UA >30 (A) 0 - 5 /hpf Final   RBC, Urine 0-2 0 - 2 /hpf Final   Epithelial Cells (non renal) 0-10 0 - 10 /hpf Final   Mucus, UA Present (A) Not Estab. Final   Bacteria, UA Many (A) None seen/Few Final   Imaging US Venous Img Lower Bilateral (DVT) Result Date: 06/20/2023 CLINICAL DATA:  History of recurrent deep venous thrombosis. Leg pain, varicose veins, skin malignancy and thyroid hormone therapy. No anticoagulation. EXAM: Bilateral LOWER EXTREMITY VENOUS DOPPLER ULTRASOUND TECHNIQUE: Gray-scale sonography with compression, as well as color and duplex ultrasound, were performed to evaluate the deep venous system(s) from the level of the common femoral vein through the popliteal and proximal calf veins. COMPARISON:  None  Available. FINDINGS: VENOUS Normal compressibility of the common femoral, superficial femoral, and left popliteal veins, as well as the visualized calf veins. Visualized portions of profunda femoral vein and great saphenous vein unremarkable. No filling defects to suggest DVT on grayscale or color Doppler imaging. Doppler waveforms show normal direction of venous flow, normal respiratory plasticity and response to augmentation. There is eccentric nonocclusive thrombus demonstrated in the right popliteal vein. Appearance is similar to the previous study likely indicating chronic venous thrombosis. OTHER Bilateral popliteal cysts, right measuring 4.7 x 1.4 x 2.8 cm and left 7 x 2.7 x 2.2 cm. Limitations: none IMPRESSION: 1. Eccentric nonocclusive thrombus in the right popliteal vein similar to prior study likely indicating chronic deep venous thrombosis. 2. Otherwise, no evidence of deep venous thrombosis in the visualized lower extremity veins. 3. Bilateral popliteal cysts. Electronically Signed   By: Burman Nieves M.D.   On: 06/20/2023 18:54      Time coordinating discharge: over 30 minutes  SIGNED:  Sunnie Nielsen DO [facility]

## 2023-06-24 LAB — TYPE AND SCREEN
ABO/RH(D): A POS
Antibody Screen: NEGATIVE
Unit division: 0

## 2023-06-24 LAB — BPAM RBC
Blood Product Expiration Date: 202412302359
Unit Type and Rh: 600

## 2023-06-24 LAB — PREPARE RBC (CROSSMATCH)

## 2023-06-29 ENCOUNTER — Encounter (INDEPENDENT_AMBULATORY_CARE_PROVIDER_SITE_OTHER): Payer: Self-pay | Admitting: Nurse Practitioner

## 2023-07-03 NOTE — Telephone Encounter (Signed)
 She would not need that [place] scheduled on 12/23 based on the study she had done at the hospital

## 2023-07-04 ENCOUNTER — Ambulatory Visit: Payer: Medicare HMO | Admitting: Clinical

## 2023-07-04 DIAGNOSIS — F330 Major depressive disorder, recurrent, mild: Secondary | ICD-10-CM

## 2023-07-04 DIAGNOSIS — F419 Anxiety disorder, unspecified: Secondary | ICD-10-CM

## 2023-07-04 NOTE — Progress Notes (Signed)
   Stacy Barthel, LCSW

## 2023-07-04 NOTE — Progress Notes (Signed)
 [facility] Counselor/Therapist Progress Note  Patient ID: Stacy Allen, MRN: [id number],    Date: 07/04/2023  Time Spent: 2:34pm - 3:36pm : 62 minutes   Treatment Type: Individual Therapy  Reported Symptoms: Patient reported feelings of sadness, anger  Mental Status Exam: Appearance:  Neat and Well Groomed     Behavior: Appropriate  Motor: Normal  Speech/Language:  Clear and Coherent  Affect: Tearful  Mood: Patient reported feeling  sad, mad, confused, disheartened, discouraged  Thought process: normal  Thought content:   WNL  Sensory/Perceptual disturbances:   Headache   Orientation: oriented to person, place, and situation  Attention: Good  Concentration: Good  Memory: WNL  Fund of knowledge:  Good  Insight:   Good  Judgment:  Good  Impulse Control: Good   Risk Assessment: Danger to Self:  No Patient denied current suicidal ideation  Self-injurious Behavior: No Danger to Others: No Patient denied current homicidal ideation Duty to Warn:no Physical Aggression / Violence:No  Access to Firearms a concern: No  Gang Involvement:No   Subjective: Patient reported she is waiting for the new camper to be set up and reported the set up is being delayed due to weather. Patient reported she is currently staying in her nephew's home. Patient stated, anything that can go wrong has. Patient reported family's financial stressors are delaying patient's camper from being set up.  Patient reported the day after patient's last therapy session patient was hospitalized for 5 days due a decrease in patient's hemoglobin level. Patient reported patient's physicians have not been able to determine the cause for decrease in patient's hemoglobin level. Patient reported tension in the home and stated, I'm under constant tension. Patient reported she refrains from responding to the tension. Patient reported she has provided financial assistance to patient's grandson to attend an eye  appointment and obtain glasses. Patient reported she pays 600.00 in rent each month to patient's niece and nephew. Patient stated, I shouldn't have to be worried about everybody's payments. Patient reported patient's grandson has a diagnosis of Autism Spectrum Disorder and patient expressed concern about grandson's financial decisions. Patient reported in the past patient has felt caught between patient's husband and daughter. Patient stated, now I feel caught between God and everybody. Patient stated, I feel caught between my daughter and grandson and I don't know how to handle it. Patient stated, it seems like it just rains negative on me. Patient stated, sad, mad, confused, disheartened, discouraged, in the way in response to patient's mood. Patient reported feeling like I'm walking on egg shells.  Interventions: Cognitive Behavioral Therapy and supportive therapy.  Clinician conducted session in person at clinician's office at [facility]. Reviewed events since last session. Provided supportive therapy, active listening, and validation as patient discussed recent hospitalization, family dynamics and stressors. Explored and identified thoughts and feelings triggered by family dynamics and stressors. Provided psycho education related to Autism Spectrum Disorder. Challenged statements/thoughts to assist patient in reframing situations. Discussed coping strategies for patient to utilize in response to tension in the household, such as, patient going in patient's room and shutting the door or driving to [employer]. Clinician requested for homework patient practice challenging thoughts and identify 1 positive for each day.   Collaboration of Care: not required at this time   Diagnosis:  Mild episode of recurrent major depressive disorder (HCC)   Anxiety disorder, unspecified type     Plan: Patient is to utilize [employer], thought re-framing, mindfulness and coping  strategies  to decrease symptoms associated with their diagnosis. Frequency: bi-weekly  Modality: individual      Long-term goal:   Patient stated, I guess I want to see my being able to recognize and appreciate what I have instead of focusing on all I've lost.  Target Date: 12/01/23  Progress: progressing    Short-term goal:  Write at least one positive affirmation about herself and/or patient's environment each day Target Date: 12/01/23  Progress: progressing    Practice gratitude exercises to decrease symptoms of depression Target Date: 12/01/23  Progress: progressing    Identify, challenge, and replace negative thought patterns that contribute to feelings of depression and anxiety with positive thoughts and beliefs per patient's report Target Date: 12/01/23  Progress: progressing    Develop and implement strategies to increase self care, such as, participating in water exercise class, accepting invitations to social functions   Target Date: 12/01/23  Progress: progressing    verbalize positive statements regarding self and her ability to cope with life stressors     Target Date: 12/01/23  Progress: progressing    Darice Seats, LCSW

## 2023-07-07 ENCOUNTER — Other Ambulatory Visit: Payer: Self-pay

## 2023-07-07 ENCOUNTER — Encounter (HOSPITAL_COMMUNITY): Payer: Self-pay

## 2023-07-07 ENCOUNTER — Emergency Department (HOSPITAL_COMMUNITY)
Admission: EM | Admit: 2023-07-07 | Discharge: 2023-07-07 | Disposition: A | Discharge status: Home / Self Care | Payer: Medicare HMO

## 2023-07-07 DIAGNOSIS — D649 Anemia, unspecified: Secondary | ICD-10-CM | POA: Insufficient documentation

## 2023-07-07 DIAGNOSIS — Z91040 Latex allergy status: Secondary | ICD-10-CM | POA: Insufficient documentation

## 2023-07-07 LAB — BASIC METABOLIC PANEL
Anion gap: 9 (ref 5–15)
BUN: 16 mg/dL (ref 8–23)
CO2: 23 mmol/L (ref 22–32)
Calcium: 8.5 mg/dL — ABNORMAL LOW (ref 8.9–10.3)
Chloride: 103 mmol/L (ref 98–111)
Creatinine, Ser: 0.77 mg/dL (ref 0.44–1.00)
GFR, Estimated: >60 mL/min (ref >60)
Glucose, Bld: 114 mg/dL — ABNORMAL HIGH (ref 70–99)
Potassium: 3.8 mmol/L (ref 3.5–5.1)
Sodium: 135 mmol/L (ref 135–145)

## 2023-07-07 LAB — CBC WITH DIFFERENTIAL/PLATELET
Abs Immature Granulocytes: 0.03 10*3/uL (ref 0.00–0.07)
Basophils Absolute: 0.1 10*3/uL (ref 0.0–0.1)
Basophils Relative: 2 %
Eosinophils Absolute: 0.1 10*3/uL (ref 0.0–0.5)
Eosinophils Relative: 2 %
HCT: 25.9 % — ABNORMAL LOW (ref 36.0–46.0)
Hemoglobin: 7.6 g/dL — ABNORMAL LOW (ref 12.0–15.0)
Immature Granulocytes: 1 %
Lymphocytes Relative: 19 %
Lymphs Abs: 1 10*3/uL (ref 0.7–4.0)
MCH: 24.8 pg — ABNORMAL LOW (ref 26.0–34.0)
MCHC: 29.3 g/dL — ABNORMAL LOW (ref 30.0–36.0)
MCV: 84.6 fL (ref 80.0–100.0)
Monocytes Absolute: 0.6 10*3/uL (ref 0.1–1.0)
Monocytes Relative: 12 %
Neutro Abs: 3.6 10*3/uL (ref 1.7–7.7)
Neutrophils Relative %: 64 %
Platelets: 360 10*3/uL (ref 150–400)
RBC: 3.06 MIL/uL — ABNORMAL LOW (ref 3.87–5.11)
RDW: 18.2 % — ABNORMAL HIGH (ref 11.5–15.5)
WBC: 5.5 10*3/uL (ref 4.0–10.5)
nRBC: 0 % (ref 0.0–0.2)

## 2023-07-07 LAB — PREPARE RBC (CROSSMATCH)

## 2023-07-07 MED ORDER — SODIUM CHLORIDE 0.9% IV SOLUTION: Freq: Once | INTRAVENOUS | Status: AC

## 2023-07-07 NOTE — ED Triage Notes (Addendum)
 Pt sent by PCP for low hgb (7.8) on Wednesday. Hx of same in December, pt had endoscopy, colonoscopy but they could not find any bleeding. Pt c.o fatigue and DOE. Pt is not taking Eliquis anymore. Pt a.o, resp e/u

## 2023-07-07 NOTE — ED Provider Notes (Signed)
 [place] EMERGENCY DEPARTMENT AT [facility] Provider Note   CSN: [id number] Arrival date & time: 07/07/23  9081     History  Chief Complaint  Patient presents with   Anemia    Stacy Allen is a 83 y.o. female.  83 year old female presenting emergency department for symptomatic anemia.  Recent hospitalization received 2 units of blood.  Since leaving the hospital she has continued to have dark-colored stools.  Continues to have some lightheadedness, but no chest pain no shortness of breath.  She has stopped taking her Eliquis .  She went to her primary doctor who drew blood and noted to be 7.6 and told to go to the emergency department.   Anemia       Home Medications Prior to Admission medications   Medication Sig Start Date End Date Taking? Authorizing Provider  acetaminophen  (TYLENOL ) 500 MG tablet Take 500 mg by mouth every 6 (six) hours as needed.    [provider]  albuterol  (PROVENTIL  HFA;VENTOLIN  HFA) 108 (90 BASE) MCG/ACT inhaler Inhale 2 puffs into the lungs every 6 (six) hours as needed for wheezing or shortness of breath.    [provider]  carboxymethylcellulose (REFRESH PLUS) 0.5 % SOLN Place 1 drop into both eyes daily as needed (dry eyes).    [provider]  cefUROXime  (CEFTIN ) 250 MG tablet Take 1 tablet (250 mg total) by mouth 2 (two) times daily with a meal. 06/15/23   McGowan, Clotilda A, PA-C  citalopram  (CELEXA ) 20 MG tablet Take 20 mg by mouth daily.    [provider]  clonazePAM  (KLONOPIN ) 1 MG tablet Take 1 mg by mouth at bedtime.    [provider]  EPINEPHrine  0.3 mg/0.3 mL IJ SOAJ injection Inject 0.3 mg into the muscle once. 04/20/20   [provider]  FEROSUL 325 (65 Fe) MG tablet Take 325 mg by mouth daily as needed (when tolerable). Patient not taking: Reported on 06/20/2023 01/26/23   [provider]  Fluticasone -Salmeterol (ADVAIR HFA IN) Inhale 1 puff into the lungs  daily as needed.    [provider]  gabapentin  (NEURONTIN ) 600 MG tablet Take 600-1,200 mg by mouth See admin instructions. Take 1 tablet (600mg ) by mouth in the morning and 2 tablets (1200mg ) by mouth at bedtime 04/24/20   [provider]  levothyroxine  (SYNTHROID ) 150 MCG tablet Take 1 tablet (150 mcg total) by mouth daily. 06/23/23   Alexander, Natalie, DO  omeprazole  (PRILOSEC) 40 MG capsule Take 1 capsule (40 mg total) by mouth 2 (two) times daily before a meal. 06/23/23   Marsa Edelman, DO  ondansetron  (ZOFRAN -ODT) 4 MG disintegrating tablet Take 1 tablet (4 mg total) by mouth every 8 (eight) hours as needed for nausea or vomiting. 04/12/23   White, Shelba SAUNDERS, NP  tiZANidine  (ZANAFLEX ) 4 MG tablet Take 4 mg by mouth at bedtime as needed for muscle spasms. Only uses 1/4 of medication 01/23/23   [provider]  trimethoprim  (TRIMPEX ) 100 MG tablet Take 1 tablet (100 mg total) by mouth daily. 05/15/23   Gaston Hamilton, MD  loratadine  (CLARITIN ) 10 MG tablet Take 1 tablet (10 mg total) by mouth daily. Take 1 tablet in the morning. As needed for itching. 02/14/17 02/17/19  Desiderio Beagle, MD      Allergies    Azithromycin, Meloxicam, Morphine , Adhesive [tape], Codeine, Lactose intolerance (gi), Red dye #40 (allura red), Shellfish-derived products, Aspirin , Latex, Lodine [etodolac], Morphine  and codeine, Penicillins, and Pravastatin    Review  of Systems   Review of Systems  Physical Exam Updated Vital Signs BP (!) 151/74   Pulse 80   Temp 98.3 F (36.8 C) (Oral)   Resp (!) 22   Ht 5' 3 (1.6 m)   Wt 106.6 kg   SpO2 100%   BMI 41.63 kg/m  Physical Exam Vitals and nursing note reviewed.  Constitutional:      General: She is not in acute distress.    Appearance: She is not toxic-appearing.  HENT:     Head: Atraumatic.     Nose: Nose normal.     Mouth/Throat:     Mouth: Mucous membranes are moist.  Eyes:     Conjunctiva/sclera: Conjunctivae normal.   Cardiovascular:     Rate and Rhythm: Normal rate and regular rhythm.  Pulmonary:     Effort: Pulmonary effort is normal.  Abdominal:     General: Abdomen is flat. There is no distension.     Tenderness: There is no abdominal tenderness. There is no guarding or rebound.  Musculoskeletal:        General: Normal range of motion.  Skin:    General: Skin is warm and dry.     Capillary Refill: Capillary refill takes less than 2 seconds.  Neurological:     Mental Status: She is alert and oriented to person, place, and time.  Psychiatric:        Mood and Affect: Mood normal.        Behavior: Behavior normal.     ED Results / Procedures / Treatments   Labs (all labs ordered are listed, but only abnormal results are displayed) Labs Reviewed  CBC WITH DIFFERENTIAL/PLATELET - Abnormal; Notable for the following components:      Result Value   RBC 3.06 (*)    Hemoglobin 7.6 (*)    HCT 25.9 (*)    MCH 24.8 (*)    MCHC 29.3 (*)    RDW 18.2 (*)    All other components within normal limits  BASIC METABOLIC PANEL - Abnormal; Notable for the following components:   Glucose, Bld 114 (*)    Calcium  8.5 (*)    All other components within normal limits  TYPE AND SCREEN  PREPARE RBC (CROSSMATCH)    EKG None  Radiology No results found.  Procedures Procedures    Medications Ordered in ED Medications  0.9 %  sodium chloride  infusion (Manually program via Guardrails IV Fluids) (0 mLs Intravenous Stopped 07/07/23 1411)    ED Course/ Medical Decision Making/ A&P Clinical Course as of 07/07/23 1505  Fri Jul 07, 2023  9060 D/C 12/27 for symptomatic anemia per chart review from DC summary   : Hospital course / significant events:   HPI: Stacy Allen is a 83 y.o. 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 Presents to ED 06/20/23 for concerns abnormal labs (anemia) and fatigue.  Of note, was admitted to hospital for GI bleed 03/2023. EGD showed large hiatal hernia, benign-appearing gastric polyp-no obvious bleeding source. Colonoscopy showed cecal polyp, internal hemorrhoids, no foci of bleeding. Discharged home. Was off Eliquis  following hospitalization. Recently restarted on Eliquis  lower dose 2.5 mg bid by vascular surgery 06/08/23 given concern for hx recurrent DVT. Pt was taking this, noted darker stool, stopped the Eliquis  06/18/23 (2 days prior to presentation to ED - last dose she thinks Sat night or Sun morning)  12/24: to ED, Hgb 6.6. 2 units PRBC ordered by EDP. Admitted to hospitalist service.   12/25: Hgb 8.2. GI consult --> no benefit to repeat scope, recs for capsule endoscopy tomorrow. 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 Pt ok to d/c home    Consultants:   Gastroenterology  [TY]    Clinical Course User Index [TY] Neysa Caron PARAS, DO                                 Medical Decision Making Well-appearing 83 year old female presenting emergency department for anemia on outpatient labs.  She is afebrile nontachycardic hemodynamically stable.  Has had some dark stools, but per chart review has had a recent admission evaluation by GI, negative endoscopy/colonoscopy and pill swallow study with no overt source of blood loss.  Repeat hemoglobin today 7.8.  She is somewhat symptomatic and does have comorbid disease.  Will transfuse 1 unit.  No other significant metabolic derangements, normal kidney function.  After transfusion, patient monitored, no adverse reactions.  Offered admission for further  workup of her anemia.  She declined and would like to follow-up with her primary doctors.  With shared decision making feel this is not unreasonable given recent hospitalization and at her hemoglobin is largely stable at this point.  Will discharge in stable condition.  Return precautions given.  Amount and/or Complexity of Data Reviewed Independent Historian:     Details: Family member notes patient unable to take iron  pills External Data Reviewed:     Details: See above  Labs: ordered.  Risk Prescription drug management. Decision regarding hospitalization.         Final Clinical Impression(s) / ED Diagnoses Final diagnoses:  Symptomatic anemia    Rx / DC Orders ED Discharge Orders     None         Neysa Caron PARAS, DO 07/07/23 1505

## 2023-07-07 NOTE — ED Notes (Signed)
 Pt assisted to the restroom with WC

## 2023-07-07 NOTE — Discharge Instructions (Addendum)
 Please follow-up with your primary doctor.  We are giving the number to the hematology oncology.  You may call and schedule an appointment there.  Will also like you to follow-up with your gastroenterologist.  Return Stacy Allen felt fevers, chills, chest pain, shortness of breath, abdominal pain, or obvious source of bleeding.  You may also return if develop any new or worsening symptoms that are concerning to you.

## 2023-07-08 LAB — BPAM RBC
Blood Product Expiration Date: 202502072359
ISSUE DATE / TIME: 202501101148
Unit Type and Rh: 6200

## 2023-07-08 LAB — TYPE AND SCREEN
ABO/RH(D): A POS
Antibody Screen: NEGATIVE
Unit division: 0

## 2023-07-18 ENCOUNTER — Ambulatory Visit: Payer: Medicare HMO | Admitting: Clinical

## 2023-07-18 DIAGNOSIS — F330 Major depressive disorder, recurrent, mild: Secondary | ICD-10-CM | POA: Diagnosis not present

## 2023-07-18 DIAGNOSIS — F419 Anxiety disorder, unspecified: Secondary | ICD-10-CM | POA: Diagnosis not present

## 2023-07-18 NOTE — Progress Notes (Signed)
                Dezi Schaner, LCSW 

## 2023-07-18 NOTE — Progress Notes (Signed)
[facility] Counselor/Therapist Progress Note  Patient ID: Stacy Allen, MRN: [id number],    Date: 07/18/2023  Time Spent: 1:36pm - 2:25pm : 49 minutes   Treatment Type: Individual Therapy  Reported Symptoms: Patient reported feeling agitated  Mental Status Exam: Appearance:  Neat and Well Groomed     Behavior: Appropriate  Motor: Normal  Speech/Language:  Clear and Coherent  Affect: Appropriate  Mood: Patient reported "agitated"   Thought process: normal  Thought content:   WNL  Sensory/Perceptual disturbances:   WNL  Orientation: oriented to person, place, and situation  Attention: Good  Concentration: Good  Memory: WNL  Fund of knowledge:  Good  Insight:   Good  Judgment:  Good  Impulse Control: Good   Risk Assessment: Danger to Self:  No Patient denied current suicidal ideation  Self-injurious Behavior: No Danger to Others: No Patient denied current homicidal ideation Duty to Warn:no Physical Aggression / Violence:No  Access to Firearms a concern: No  Gang Involvement:No   Subjective: Patient stated, "I want to be so positive but I'm going to go crazy if I have to stay in that house for a long time". Patient stated, "everything is about her (niece)". Patient reported her niece feels she should be thanked for every task completed by niece or others. Patient stated, "I've been working on that" in response to patient's homework to identify 1 positive each day. Patient reported, "my niece and her gossip" is a barrier to patient practicing positive thinking. Patient reported gossip is against patient's religious beliefs. Patient reported the behaviors of the four dogs in the home are a barrier to patient practicing positive thinking. Patient reported she goes into her room in response to niece's behaviors but reported feeling isolated when in her room. Patient reported listening to patient's pastor online decreases feelings of isolation. Patient reported she  identified the following positives: sunshine, cleaned her car out, feeling "invigorated" when going outside and getting fresh air. Patient reported she has an appointment with the hematologist tomorrow and does not want to attend. Patient reported she feels she is associating hematology with oncology. Patient stated, "I don't want to find out if I have cancer". Patient reported "what if he says we'll admit you and see what we can find". Patient stated,  "I don't like living in the house with them and I don't like really living in their yard but I know that's better for me". Patient reported feeling agitated today.    Interventions: Cognitive Behavioral Therapy and Interpersonal. Clinician conducted session in person at clinician's office at [facility]. Reviewed events since last session. Reviewed patient's homework to identify 1 positive each day. Explored and identified barriers to patient identifying positive aspects each day. Discussed niece's behaviors and the impact on family dynamics. Discussed utilizing the friendship line as a resource to decrease isolation. Assisted patient in exploring and identifying thoughts/feelings triggered by hematology appointment. Challenged statements/distortions and assisted patient in reframing situation regarding hematologist. Reviewed challenging cognitive distortions and "what if" thoughts. Clinician requested for homework patient practice challenging thoughts and identify 1 positive for each day.    Collaboration of Care: not required at this time   Diagnosis:  Mild episode of recurrent major depressive disorder (HCC)   Anxiety disorder, unspecified type     Plan: Patient is to utilize [employer], thought re-framing, mindfulness and coping strategies to decrease symptoms associated with their diagnosis. Frequency: bi-weekly  Modality: individual      Long-term goal:  Patient stated, "I guess I want to see my being able to  recognize and appreciate what I have instead of focusing on all I've lost".  Target Date: 12/01/23  Progress: progressing    Short-term goal:  Write at least one positive affirmation about herself and/or patient's environment each day Target Date: 12/01/23  Progress: progressing    Practice gratitude exercises to decrease symptoms of depression Target Date: 12/01/23  Progress: progressing    Identify, challenge, and replace negative thought patterns that contribute to feelings of depression and anxiety with positive thoughts and beliefs per patient's report Target Date: 12/01/23  Progress: progressing    Develop and implement strategies to increase self care, such as, participating in water exercise class, accepting invitations to social functions   Target Date: 12/01/23  Progress: progressing    verbalize positive statements regarding self and her ability to cope with life stressors     Target Date: 12/01/23  Progress: progressing    Doree Barthel, LCSW

## 2023-07-19 ENCOUNTER — Inpatient Hospital Stay: Payer: Medicare HMO | Admitting: Oncology

## 2023-07-19 ENCOUNTER — Inpatient Hospital Stay: Payer: Medicare HMO

## 2023-07-20 ENCOUNTER — Ambulatory Visit (INDEPENDENT_AMBULATORY_CARE_PROVIDER_SITE_OTHER): Payer: Medicare HMO | Admitting: Nurse Practitioner

## 2023-07-20 ENCOUNTER — Encounter (INDEPENDENT_AMBULATORY_CARE_PROVIDER_SITE_OTHER): Payer: Medicare HMO

## 2023-07-24 ENCOUNTER — Ambulatory Visit: Payer: Medicare HMO | Admitting: Urology

## 2023-07-24 VITALS — BP 171/93 | HR 99

## 2023-07-24 DIAGNOSIS — R319 Hematuria, unspecified: Secondary | ICD-10-CM

## 2023-07-24 DIAGNOSIS — Z09 Encounter for follow-up examination after completed treatment for conditions other than malignant neoplasm: Secondary | ICD-10-CM | POA: Diagnosis not present

## 2023-07-24 DIAGNOSIS — Z87440 Personal history of urinary (tract) infections: Secondary | ICD-10-CM

## 2023-07-24 DIAGNOSIS — N3020 Other chronic cystitis without hematuria: Secondary | ICD-10-CM

## 2023-07-24 DIAGNOSIS — R3989 Other symptoms and signs involving the genitourinary system: Secondary | ICD-10-CM

## 2023-07-24 LAB — MICROSCOPIC EXAMINATION: Epithelial Cells (non renal): >10 /[HPF] — AB (ref 0–10)

## 2023-07-24 LAB — URINALYSIS, COMPLETE
Bilirubin, UA: NEGATIVE
Glucose, UA: NEGATIVE
Ketones, UA: NEGATIVE
Nitrite, UA: NEGATIVE
Protein,UA: NEGATIVE
RBC, UA: NEGATIVE
Specific Gravity, UA: 1.02 (ref 1.005–1.030)
Urobilinogen, Ur: 0.2 mg/dL (ref 0.2–1.0)
pH, UA: 7 (ref 5.0–7.5)

## 2023-07-24 NOTE — Progress Notes (Signed)
07/24/2023 10:03 AM   Stacy Allen [birth year] [id number]  Referring provider: Jodi Marble, NP [address] [place],  [place] [address]  Chief Complaint  Patient presents with   Cysto    HPI: I was consulted to assess the patient's urinary incontinence and infections.  She has urge incontinence.  No bedwetting.  I believe if she has to go too far she has foot on the floor syndrome.  She may rarely leak with sneezing.  She was not a strong historian.  She says she does not wear pads normally but wears a pull-up at night   It also appears that the patient was referred for microscopic hematuria   She can void every hour and then hold it for several hours.  She can get up 10 times at night.  Sometimes has ankle edema.  No diuretic.   She will have urgency and get a false signal and then have a poor flow   She thinks he gets a bladder infection every 2 months with burning urgency that respond favorably to antibiotics.  She said currently it hurts a little bit at the end of urination but the urine is otherwise clear   She has had low back surgery.  She has had a hysterectomy.   I reviewed urine culture from the last few years and 2 were negative and 1 was positive.  She had a normal CT scan June 2023.  It turns out she had a CT scan of the abdomen pelvis and chest in October when she was admitted and it was within normal limits   She no longer takes daily Eliquis.  She is not on aspirin.  She has a smoking history    Pathophysiology of UTIs discussed.  I would like to put her on trimethoprim 100 mg 30 x 11 and have her come back in 6 to 8 weeks for cystoscopy and pelvic examination.  Hopefully some of her urgency and frequency will also settle down.  She likely also has a nocturnal diuresis.  Workup for hematuria discussed.  Return on trimethoprim for cystoscopy.     It will be interesting to some of her symptoms especially the nocturia down regulate some on the  trimethoprim.  Today Patient is very pleased that she is clinically not having infections.  No discomfort.  Now gets up 3 times at night instead hourly. On pelvic examination she had mild prolapse and no stress incontinence with a minimal cystocele. Cystoscopy: Patient underwent flexible cystoscopy.  Bladder mucosa and trigone were normal.  No cystitis.  No carcinoma   PMH: Past Medical History:  Diagnosis Date   Adenomatous polyps    Allergic state    Anemia    Anginal pain (HCC)    Anxiety    Arthritis    osteoarthritis   Broken rib    left   Cancer (HCC)    skin   CHF (congestive heart failure) (HCC)    Chicken pox    Complication of anesthesia    respiratory distress after rectocele surgery   Coronary artery disease    Depression    Eczema    Fibromyalgia    GERD (gastroesophageal reflux disease)    Headache    migraines   Hemorrhoids    History of hiatal hernia    Hypercalcemia    Hyperlipidemia    Hypertension    Hypothyroidism    Lumbar stenosis    Obesity    Peptic ulcer disease  Shortness of breath dyspnea    Sleep apnea    No CPAP   Vertigo    Vitamin D deficiency     Surgical History: Past Surgical History:  Procedure Laterality Date   ABDOMINAL HYSTERECTOMY     BACK SURGERY  2007   Dr. Gerrit Heck, [facility], Spinal Fusion   CARDIAC CATHETERIZATION N/A 04/03/2015   Procedure: Left Heart Cath and Coronary Angiography;  Surgeon: Dalia Heading, MD;  Location: [facility] INVASIVE CV LAB;  Service: Cardiovascular;  Laterality: N/A;   CATARACT EXTRACTION W/PHACO Left 04/22/2019   Procedure: CATARACT EXTRACTION PHACO AND INTRAOCULAR LENS PLACEMENT (IOC) LEFT  00:45.4  12.9%  6.03;  Surgeon: Nevada Crane, MD;  Location: [facility];  Service: Ophthalmology;  Laterality: Left;  Latex sleep apnea   CATARACT EXTRACTION W/PHACO Right 07/01/2019   Procedure: CATARACT EXTRACTION PHACO AND INTRAOCULAR LENS PLACEMENT (IOC) RIGHT 2.38  00:24.4;  Surgeon: Nevada Crane, MD;  Location: [facility];  Service: Ophthalmology;  Laterality: Right;  Latex Sleep apnea   CHOLECYSTECTOMY     COLONOSCOPY WITH PROPOFOL N/A 04/21/2023   Procedure: COLONOSCOPY WITH PROPOFOL;  Surgeon: Benancio Deeds, MD;  Location: [facility] ENDOSCOPY;  Service: Gastroenterology;  Laterality: N/A;   CORONARY ANGIOPLASTY  2015   Dr. Lady Gary, [facility]   ESOPHAGOGASTRODUODENOSCOPY (EGD) WITH PROPOFOL N/A 12/21/2016   Procedure: ESOPHAGOGASTRODUODENOSCOPY (EGD) WITH PROPOFOL;  Surgeon: Scot Jun, MD;  Location: [facility] ENDOSCOPY;  Service: Endoscopy;  Laterality: N/A;   ESOPHAGOGASTRODUODENOSCOPY (EGD) WITH PROPOFOL N/A 12/08/2022   Procedure: ESOPHAGOGASTRODUODENOSCOPY (EGD) WITH PROPOFOL;  Surgeon: Jaynie Collins, DO;  Location: [facility] ENDOSCOPY;  Service: Gastroenterology;  Laterality: N/A;   ESOPHAGOGASTRODUODENOSCOPY (EGD) WITH PROPOFOL N/A 04/19/2023   Procedure: ESOPHAGOGASTRODUODENOSCOPY (EGD) WITH PROPOFOL;  Surgeon: Benancio Deeds, MD;  Location: [facility] ENDOSCOPY;  Service: Gastroenterology;  Laterality: N/A;   EYE SURGERY     FRACTURE SURGERY Left 1991   Fractures Femur, [facility]   GIVENS CAPSULE STUDY N/A 06/22/2023   Procedure: GIVENS CAPSULE STUDY;  Surgeon: Midge Minium, MD;  Location: [facility] ENDOSCOPY;  Service: Endoscopy;  Laterality: N/A;   HEMOSTASIS CLIP PLACEMENT  04/19/2023   Procedure: HEMOSTASIS CLIP PLACEMENT;  Surgeon: Benancio Deeds, MD;  Location: [facility] ENDOSCOPY;  Service: Gastroenterology;;   HIP ARTHROPLASTY Right 05/07/2020   Procedure: ARTHROPLASTY RIGHT HIP (HEMIARTHROPLASTY);  Surgeon: Juanell Fairly, MD;  Location: [facility] ORS;  Service: Orthopedics;  Laterality: Right;   IMAGE GUIDED SINUS SURGERY Bilateral 09/28/2015   Procedure: IMAGE GUIDED SINUS SURGERY, SEPTOPLASTY, BILATERAL FRONTAL SINUSOTOMIES, BILATERAL MAXILLARY ANTROSTOMIES, BILATERAL TOTAL ETHMOIDECTOMY, BILATERAL SPHENOIDECTOMY, BILATERAL INFERIOR  TURBINATE REDUCTION;  Surgeon: Vernie Murders, MD;  Location: [facility] ORS;  Service: ENT;  Laterality: Bilateral;   LEFT HEART CATH AND CORONARY ANGIOGRAPHY Left 01/19/2023   Procedure: LEFT HEART CATH AND CORONARY ANGIOGRAPHY;  Surgeon: Marcina Millard, MD;  Location: [facility] INVASIVE CV LAB;  Service: Cardiovascular;  Laterality: Left;   POLYPECTOMY  04/19/2023   Procedure: POLYPECTOMY;  Surgeon: Benancio Deeds, MD;  Location: [facility] ENDOSCOPY;  Service: Gastroenterology;;   POLYPECTOMY  04/21/2023   Procedure: POLYPECTOMY;  Surgeon: Benancio Deeds, MD;  Location: [facility] ENDOSCOPY;  Service: Gastroenterology;;   RECTOCELE REPAIR      Home Medications:  Allergies as of 07/24/2023       Reactions   Azithromycin Diarrhea, Rash   "fainting" Other reaction(s): Syncope  "fainting"    Other reaction(s): NOT KNOWN  "fainting"   Meloxicam Diarrhea   Morphine Other (See Comments),  Rash   Other reaction(s): Unknown Retain urine Other reaction(s): NOT KNOWN, Other (See Comments) Retain urine Other reaction(s): side effect of severe rash   Adhesive [tape]    blisters   Codeine Nausea And Vomiting   Lactose Intolerance (gi) Other (See Comments)   "stomach pain"   Red Dye #40 (allura Red) Itching   "red food dye"   Shellfish-derived Products Itching, Swelling   Aspirin Rash   Latex Rash   With extended contact   Lodine [etodolac] Rash   Morphine And Codeine Hives, Nausea And Vomiting   Other reaction(s): UNKNOWN   Penicillins Rash, Hives   Given on 05/07/20.  No reaction noted.   Pravastatin Rash        Medication List        Accurate as of July 24, 2023 10:03 AM. If you have any questions, ask your nurse or doctor.          acetaminophen 500 MG tablet Commonly known as: TYLENOL Take 500 mg by mouth every 6 (six) hours as needed.   ADVAIR HFA IN Inhale 1 puff into the lungs daily as needed.   albuterol 108 (90 Base) MCG/ACT inhaler Commonly known as: VENTOLIN  HFA Inhale 2 puffs into the lungs every 6 (six) hours as needed for wheezing or shortness of breath.   amLODipine 5 MG tablet Commonly known as: NORVASC Take 5 mg by mouth daily.   carboxymethylcellulose 0.5 % Soln Commonly known as: REFRESH PLUS Place 1 drop into both eyes daily as needed (dry eyes).   cefUROXime 250 MG tablet Commonly known as: CEFTIN Take 1 tablet (250 mg total) by mouth 2 (two) times daily with a meal.   citalopram 20 MG tablet Commonly known as: CELEXA Take 20 mg by mouth daily.   clonazePAM 1 MG tablet Commonly known as: KLONOPIN Take 1 mg by mouth at bedtime.   EPINEPHrine 0.3 mg/0.3 mL Soaj injection Commonly known as: EPI-PEN Inject 0.3 mg into the muscle once.   FeroSul 325 (65 Fe) MG tablet Generic drug: ferrous sulfate Take 325 mg by mouth daily as needed (when tolerable).   gabapentin 600 MG tablet Commonly known as: NEURONTIN Take 600-1,200 mg by mouth See admin instructions. Take 1 tablet (600mg ) by mouth in the morning and 2 tablets (1200mg ) by mouth at bedtime   levothyroxine 150 MCG tablet Commonly known as: SYNTHROID Take 1 tablet (150 mcg total) by mouth daily.   metoprolol tartrate 25 MG tablet Commonly known as: LOPRESSOR Take 25 mg by mouth daily.   omeprazole 40 MG capsule Commonly known as: PRILOSEC Take 1 capsule (40 mg total) by mouth 2 (two) times daily before a meal.   ondansetron 4 MG disintegrating tablet Commonly known as: ZOFRAN-ODT Take 1 tablet (4 mg total) by mouth every 8 (eight) hours as needed for nausea or vomiting.   tiZANidine 4 MG tablet Commonly known as: ZANAFLEX Take 4 mg by mouth at bedtime as needed for muscle spasms. Only uses 1/4 of medication   trimethoprim 100 MG tablet Commonly known as: TRIMPEX Take 1 tablet (100 mg total) by mouth daily.        Allergies:  Allergies  Allergen Reactions   Azithromycin Diarrhea and Rash    "fainting"  Other reaction(s): Syncope  "fainting"     Other reaction(s): NOT KNOWN  "fainting"   Meloxicam Diarrhea   Morphine Other (See Comments) and Rash    Other reaction(s): Unknown Retain urine Other reaction(s): NOT KNOWN, Other (See  Comments) Retain urine Other reaction(s): side effect of severe rash   Adhesive [Tape]     blisters   Codeine Nausea And Vomiting   Lactose Intolerance (Gi) Other (See Comments)    "stomach pain"   Red Dye #40 (Allura Red) Itching    "red food dye"   Shellfish-Derived Products Itching and Swelling   Aspirin Rash   Latex Rash    With extended contact   Lodine [Etodolac] Rash   Morphine And Codeine Hives and Nausea And Vomiting    Other reaction(s): UNKNOWN   Penicillins Rash and Hives    Given on 05/07/20.  No reaction noted.   Pravastatin Rash    Family History: Family History  Problem Relation Age of Onset   Hypertension Mother    Cancer Mother    Diabetes Father    Hypertension Father    Heart failure Father    Breast cancer Neg Hx     Social History:  reports that she quit smoking about 25 years ago. Her smoking use included cigarettes. She has been exposed to tobacco smoke. She has never used smokeless tobacco. She reports that she does not drink alcohol and does not use drugs.  ROS:                                        Physical Exam: BP (!) 171/93   Pulse 99   Constitutional:  Alert and oriented, No acute distress. HEENT: [place] AT, moist mucus membranes.  Trachea midline, no masses.   Laboratory Data: Lab Results  Component Value Date   WBC 5.5 07/07/2023   HGB 7.6 (L) 07/07/2023   HCT 25.9 (L) 07/07/2023   MCV 84.6 07/07/2023   PLT 360 07/07/2023    Lab Results  Component Value Date   CREATININE 0.77 07/07/2023    No results found for: "PSA"  No results found for: "TESTOSTERONE"  No results found for: "HGBA1C"  Urinalysis    Component Value Date/Time   COLORURINE STRAW (A) 04/17/2023 1808   APPEARANCEUR Hazy (A) 06/15/2023 1124    LABSPEC 1.010 04/17/2023 1808   LABSPEC 1.005 07/23/2014 1444   PHURINE 7.0 04/17/2023 1808   GLUCOSEU Negative 06/15/2023 1124   GLUCOSEU NEGATIVE 07/23/2014 1444   HGBUR NEGATIVE 04/17/2023 1808   BILIRUBINUR Negative 06/15/2023 1124   BILIRUBINUR NEGATIVE 07/23/2014 1444   KETONESUR NEGATIVE 04/17/2023 1808   PROTEINUR Negative 06/15/2023 1124   PROTEINUR NEGATIVE 04/17/2023 1808   NITRITE Positive (A) 06/15/2023 1124   NITRITE NEGATIVE 04/17/2023 1808   LEUKOCYTESUR 1+ (A) 06/15/2023 1124   LEUKOCYTESUR NEGATIVE 04/17/2023 1808   LEUKOCYTESUR NEGATIVE 07/23/2014 1444    Pertinent Imaging: Urine reviewed and sent for culture  Assessment & Plan: Reassess durability of trimethoprim in 4-5 months Call if culture positive  1. Suspected UTI (Primary)   2. Hematuria, unspecified type  - Urinalysis, Complete   No follow-ups on file.  Martina Sinner, MD  [facility] [address] [place], [place] [address] [phone]

## 2023-07-26 ENCOUNTER — Inpatient Hospital Stay: Payer: Medicare HMO | Attending: Oncology | Admitting: Oncology

## 2023-07-26 ENCOUNTER — Inpatient Hospital Stay: Payer: Medicare HMO

## 2023-07-26 ENCOUNTER — Encounter: Payer: Self-pay | Admitting: Oncology

## 2023-07-26 VITALS — BP 168/84

## 2023-07-26 VITALS — BP 153/82 | HR 80 | Temp 98.0°F | Resp 18 | Ht 62.0 in | Wt 223.8 lb

## 2023-07-26 DIAGNOSIS — I10 Essential (primary) hypertension: Secondary | ICD-10-CM

## 2023-07-26 DIAGNOSIS — Z86718 Personal history of other venous thrombosis and embolism: Secondary | ICD-10-CM

## 2023-07-26 DIAGNOSIS — Z87891 Personal history of nicotine dependence: Secondary | ICD-10-CM

## 2023-07-26 DIAGNOSIS — E785 Hyperlipidemia, unspecified: Secondary | ICD-10-CM

## 2023-07-26 DIAGNOSIS — D649 Anemia, unspecified: Secondary | ICD-10-CM

## 2023-07-26 DIAGNOSIS — D508 Other iron deficiency anemias: Secondary | ICD-10-CM

## 2023-07-26 DIAGNOSIS — D122 Benign neoplasm of ascending colon: Secondary | ICD-10-CM

## 2023-07-26 DIAGNOSIS — D509 Iron deficiency anemia, unspecified: Secondary | ICD-10-CM | POA: Insufficient documentation

## 2023-07-26 MED ORDER — SODIUM CHLORIDE 0.9% FLUSH
10.0000 mL | Freq: Once | INTRAVENOUS | Status: AC | PRN
  Administered 2023-07-26: 10 mL
  Filled 2023-07-26: qty 10

## 2023-07-26 MED ORDER — IRON SUCROSE 20 MG/ML IV SOLN
200.0000 mg | INTRAVENOUS | Status: DC
Start: 2023-07-26 — End: 2023-07-26
  Administered 2023-07-26: 200 mg via INTRAVENOUS
  Filled 2023-07-26: qty 10

## 2023-07-26 NOTE — Progress Notes (Signed)
Hematology/Oncology Consult note [facility] [phone] [phone]  Patient Care Team: Jodi Marble, NP as PCP - General (Nurse Practitioner)   Name of the patient: Stacy Allen  [id number]  May 02, 1941    Reason for referral-anemia   Referring physician-Lars Carmela Hurt, NP  Date of visit: 07/26/23   History of presenting illness- Patient is a 83 year old female with a past medical history significant for hypertension hyperlipidemia, CHF arthritis hypothyroidism among other medical problems.. She has a history of right lower extremity DVT back in 2021 following her hip surgery and she remained on Eliquis since then until her recent episode of GI bleed.  She does not recollect if she has had any other episodes of DVT prior to that. 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  She has had admissions for GI bleed in October 2024 and presented with similar complaints in December 2024.  Eliquis was discontinued she had EGD and colonoscopy in October 2024 which showed 10 cm hiatal hernia and benign-appearing gastric polyp but no evidence of bleeding.  Colonoscopy at the same time had shown polyp in the cecum and the ascending colon which were resected and were negative for malignancy.  During her hospitalization in December 2024 she also underwent a capsule endoscopy which was negative.  CBC from 07/21/2023 showed an H&H of 9.1/30 with an MCV of 82.4.  White count and platelet counts were normal.  She had ferritin and iron studies checked back in October 2024 which showed evidence of iron deficiency.  Last blood transfusion was given on 07/07/2023.  She has not received any IV iron recently  ECOG PS- 2  Pain scale- 0   Review of systems- Review of Systems  Constitutional:   Positive for malaise/fatigue. Negative for chills, fever and weight loss.  HENT:  Negative for congestion, ear discharge and nosebleeds.   Eyes:  Negative for blurred vision.  Respiratory:  Negative for cough, hemoptysis, sputum production, shortness of breath and wheezing.   Cardiovascular:  Negative for chest pain, palpitations, orthopnea and claudication.  Gastrointestinal:  Negative for abdominal pain, blood in stool, constipation, diarrhea, heartburn, melena, nausea and vomiting.  Genitourinary:  Negative for dysuria, flank pain, frequency, hematuria and urgency.  Musculoskeletal:  Negative for back pain, joint pain and myalgias.  Skin:  Negative for rash.  Neurological:  Negative for dizziness, tingling, focal weakness, seizures, weakness and headaches.  Endo/Heme/Allergies:  Does not bruise/bleed easily.  Psychiatric/Behavioral:  Negative for depression and suicidal ideas. The patient does not have insomnia.     Allergies  Allergen Reactions   Azithromycin Diarrhea and Rash    "fainting"  Other reaction(s): Syncope  "fainting"    Other reaction(s): NOT KNOWN  "fainting"   Meloxicam Diarrhea   Morphine Other (See Comments) and Rash    Other reaction(s): Unknown Retain urine Other reaction(s): NOT KNOWN, Other (See Comments) Retain urine Other reaction(s): side effect of severe rash   Adhesive [Tape]     blisters   Codeine Nausea And Vomiting   Lactose Intolerance (Gi) Other (See Comments)    "stomach pain"   Red Dye #40 (Allura Red) Itching    "red food dye"   Shellfish-Derived Products Itching and Swelling   Aspirin Rash   Latex Rash    With extended contact   Lodine [Etodolac] Rash   Morphine  And Codeine Hives and Nausea And Vomiting    Other reaction(s): UNKNOWN   Penicillins Rash and Hives    Given on 05/07/20.  No reaction noted.   Pravastatin Rash    Patient Active Problem List   Diagnosis Date Noted   Anemia 06/20/2023   Benign neoplasm of colon  04/21/2023   Melena 04/19/2023   Gastric polyp 04/19/2023   GI bleed 04/18/2023   History of DVT (deep vein thrombosis) 04/18/2023   Acute GI bleeding 04/18/2023   Anxiety 04/18/2023   Obesity, Class III, BMI 40-49.9 (morbid obesity) (HCC) 04/18/2023   Symptomatic anemia 04/18/2023   Anticoagulated 04/18/2023   C. difficile diarrhea 10/21/2021   Dehydration 10/21/2021   Generalized weakness 10/21/2021   Shoulder pain, right    Closed right hip fracture (HCC) 05/06/2020   Essential hypertension 05/06/2020   Hypothyroidism 05/06/2020   Lumbar spondylosis 06/01/2017   Lumbar degenerative disc disease 06/01/2017   History of lumbar fusion 06/01/2017   Age related osteoporosis 06/01/2017   Chronic pain syndrome 06/01/2017   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     Past Medical History:  Diagnosis Date   Adenomatous polyps    Allergic state    Anemia    Anginal pain (HCC)    Anxiety    Arthritis    osteoarthritis   Broken rib    left   Cancer (HCC)    skin   CHF (congestive heart failure) (HCC)    Chicken pox    Complication of anesthesia    respiratory distress after rectocele surgery   Coronary artery disease    Depression    Eczema    Fibromyalgia    GERD (gastroesophageal reflux disease)    Headache    migraines   Hemorrhoids    History of hiatal hernia    Hypercalcemia    Hyperlipidemia    Hypertension    Hypothyroidism    Lumbar stenosis    Obesity    Peptic ulcer disease    Shortness of breath dyspnea    Sleep apnea    No CPAP   Vertigo    Vitamin D deficiency      Past Surgical History:  Procedure Laterality Date   ABDOMINAL HYSTERECTOMY     BACK SURGERY  2007   Dr. Gerrit Heck, [facility], Spinal Fusion   CARDIAC CATHETERIZATION N/A 04/03/2015   Procedure: Left Heart Cath and Coronary Angiography;  Surgeon: Dalia Heading, MD;  Location: [facility] INVASIVE CV LAB;  Service: Cardiovascular;  Laterality: N/A;   CATARACT EXTRACTION W/PHACO Left 04/22/2019    Procedure: CATARACT EXTRACTION PHACO AND INTRAOCULAR LENS PLACEMENT (IOC) LEFT  00:45.4  12.9%  6.03;  Surgeon: Nevada Crane, MD;  Location: [facility] SURGERY CNTR;  Service: Ophthalmology;  Laterality: Left;  Latex sleep apnea   CATARACT EXTRACTION W/PHACO Right 07/01/2019   Procedure: CATARACT EXTRACTION PHACO AND INTRAOCULAR LENS PLACEMENT (IOC) RIGHT 2.38  00:24.4;  Surgeon: Nevada Crane, MD;  Location: [facility] SURGERY CN[facility];  Service: Ophthalmology;  Laterality: Right;  Latex Sleep apnea   CHOLECYSTECTOMY     COLONOSCOPY WITH PROPOFOL N/A 04/21/2023   Procedure: COLONOSCOPY WITH PROPOFOL;  Surgeon: Benancio Deeds, MD;  Location: [facility] ENDOSCOPY;  Service: Gastroenterology;  Laterality: N/A;   CORONARY ANGIOPLASTY  2015   Dr. Lady Gary, [facility] Cath Lab   ESOPHAGOGASTRODUODENOSCOPY (EGD) WITH PROPOFOL N/A 12/21/2016   Procedure: ESOPHAGOGASTRODUODENOSCOPY (EGD) WITH PROPOFOL;  Surgeon: Scot Jun, MD;  Location: [facility] ENDOSCOPY;  Service: Endoscopy;  Laterality: N/A;  ESOPHAGOGASTRODUODENOSCOPY (EGD) WITH PROPOFOL N/A 12/08/2022   Procedure: ESOPHAGOGASTRODUODENOSCOPY (EGD) WITH PROPOFOL;  Surgeon: Jaynie Collins, DO;  Location: [facility] ENDOSCOPY;  Service: Gastroenterology;  Laterality: N/A;   ESOPHAGOGASTRODUODENOSCOPY (EGD) WITH PROPOFOL N/A 04/19/2023   Procedure: ESOPHAGOGASTRODUODENOSCOPY (EGD) WITH PROPOFOL;  Surgeon: Benancio Deeds, MD;  Location: [facility] ENDOSCOPY;  Service: Gastroenterology;  Laterality: N/A;   EYE SURGERY     FRACTURE SURGERY Left 1991   Fractures Femur, [facility]   GIVENS CAPSULE STUDY N/A 06/22/2023   Procedure: GIVENS CAPSULE STUDY;  Surgeon: Midge Minium, MD;  Location: [facility] ENDOSCOPY;  Service: Endoscopy;  Laterality: N/A;   HEMOSTASIS CLIP PLACEMENT  04/19/2023   Procedure: HEMOSTASIS CLIP PLACEMENT;  Surgeon: Benancio Deeds, MD;  Location: [facility] ENDOSCOPY;  Service: Gastroenterology;;   HIP ARTHROPLASTY Right 05/07/2020   Procedure:  ARTHROPLASTY RIGHT HIP (HEMIARTHROPLASTY);  Surgeon: Juanell Fairly, MD;  Location: [facility] ORS;  Service: Orthopedics;  Laterality: Right;   IMAGE GUIDED SINUS SURGERY Bilateral 09/28/2015   Procedure: IMAGE GUIDED SINUS SURGERY, SEPTOPLASTY, BILATERAL FRONTAL SINUSOTOMIES, BILATERAL MAXILLARY ANTROSTOMIES, BILATERAL TOTAL ETHMOIDECTOMY, BILATERAL SPHENOIDECTOMY, BILATERAL INFERIOR TURBINATE REDUCTION;  Surgeon: Vernie Murders, MD;  Location: [facility] ORS;  Service: ENT;  Laterality: Bilateral;   LEFT HEART CATH AND CORONARY ANGIOGRAPHY Left 01/19/2023   Procedure: LEFT HEART CATH AND CORONARY ANGIOGRAPHY;  Surgeon: Marcina Millard, MD;  Location: [facility] INVASIVE CV LAB;  Service: Cardiovascular;  Laterality: Left;   POLYPECTOMY  04/19/2023   Procedure: POLYPECTOMY;  Surgeon: Benancio Deeds, MD;  Location: [facility] ENDOSCOPY;  Service: Gastroenterology;;   POLYPECTOMY  04/21/2023   Procedure: POLYPECTOMY;  Surgeon: Benancio Deeds, MD;  Location: [facility] ENDOSCOPY;  Service: Gastroenterology;;   RECTOCELE REPAIR      Social History   Socioeconomic History   Marital status: Widowed    Spouse name: Not on file   Number of children: Not on file   Years of education: Not on file   Highest education level: Not on file  Occupational History   Not on file  Tobacco Use   Smoking status: Former    Current packs/day: 0.00    Types: Cigarettes    Quit date: 2000    Years since quitting: 25.0    Passive exposure: Past   Smokeless tobacco: Never  Vaping Use   Vaping status: Never Used  Substance and Sexual Activity   Alcohol use: No   Drug use: No   Sexual activity: Not on file  Other Topics Concern   Not on file  Social History Narrative   Lives in camper in nephew's backyard. No pets.   Social Drivers of Corporate investment banker Strain: Low Risk  (06/02/2023)   Received from [facility]   Overall Financial Resource Strain (CARDIA)    Difficulty of Paying Living  Expenses: Not hard at all  Food Insecurity: No Food Insecurity (06/21/2023)   Hunger Vital Sign    Worried About Running Out of Food in the Last Year: Never true    Ran Out of Food in the Last Year: Never true  Transportation Needs: No Transportation Needs (06/21/2023)   PRAPARE - Administrator, Civil Service (Medical): No    Lack of Transportation (Non-Medical): No  Physical Activity: Not on file  Stress: Not on file  Social Connections: Not on file  Intimate Partner Violence: Not At Risk (06/21/2023)   Humiliation, Afraid, Rape, and Kick questionnaire    Fear of Current or Ex-Partner: No  Emotionally Abused: No    Physically Abused: No    Sexually Abused: No     Family History  Problem Relation Age of Onset   Hypertension Mother    Cancer Mother    Diabetes Father    Hypertension Father    Heart failure Father    Breast cancer Neg Hx      Current Outpatient Medications:    acetaminophen (TYLENOL) 500 MG tablet, Take 500 mg by mouth every 6 (six) hours as needed., Disp: , Rfl:    albuterol (PROVENTIL HFA;VENTOLIN HFA) 108 (90 BASE) MCG/ACT inhaler, Inhale 2 puffs into the lungs every 6 (six) hours as needed for wheezing or shortness of breath., Disp: , Rfl:    amLODipine (NORVASC) 5 MG tablet, Take 5 mg by mouth daily., Disp: , Rfl:    carboxymethylcellulose (REFRESH PLUS) 0.5 % SOLN, Place 1 drop into both eyes daily as needed (dry eyes)., Disp: , Rfl:    cefUROXime (CEFTIN) 250 MG tablet, Take 1 tablet (250 mg total) by mouth 2 (two) times daily with a meal., Disp: 14 tablet, Rfl: 0   citalopram (CELEXA) 20 MG tablet, Take 20 mg by mouth daily., Disp: , Rfl:    clonazePAM (KLONOPIN) 1 MG tablet, Take 1 mg by mouth at bedtime., Disp: , Rfl:    EPINEPHrine 0.3 mg/0.3 mL IJ SOAJ injection, Inject 0.3 mg into the muscle once., Disp: , Rfl:    FEROSUL 325 (65 Fe) MG tablet, Take 325 mg by mouth daily as needed (when tolerable)., Disp: , Rfl:     Fluticasone-Salmeterol (ADVAIR HFA IN), Inhale 1 puff into the lungs daily as needed., Disp: , Rfl:    gabapentin (NEURONTIN) 600 MG tablet, Take 600-1,200 mg by mouth See admin instructions. Take 1 tablet (600mg ) by mouth in the morning and 2 tablets (1200mg ) by mouth at bedtime, Disp: , Rfl:    levothyroxine (SYNTHROID) 150 MCG tablet, Take 1 tablet (150 mcg total) by mouth daily., Disp: , Rfl:    metoprolol tartrate (LOPRESSOR) 25 MG tablet, Take 25 mg by mouth daily., Disp: , Rfl:    omeprazole (PRILOSEC) 40 MG capsule, Take 1 capsule (40 mg total) by mouth 2 (two) times daily before a meal., Disp: 60 capsule, Rfl: 1   ondansetron (ZOFRAN-ODT) 4 MG disintegrating tablet, Take 1 tablet (4 mg total) by mouth every 8 (eight) hours as needed for nausea or vomiting., Disp: 20 tablet, Rfl: 0   tiZANidine (ZANAFLEX) 4 MG tablet, Take 4 mg by mouth at bedtime as needed for muscle spasms. Only uses 1/4 of medication, Disp: , Rfl:    trimethoprim (TRIMPEX) 100 MG tablet, Take 1 tablet (100 mg total) by mouth daily., Disp: 30 tablet, Rfl: 11   Physical exam: There were no vitals filed for this visit. Physical Exam Constitutional:      Comments: Sitting in a wheelchair.  Appears in no acute distress  Cardiovascular:     Rate and Rhythm: Normal rate and regular rhythm.     Heart sounds: Normal heart sounds.  Pulmonary:     Effort: Pulmonary effort is normal.     Breath sounds: Normal breath sounds.  Abdominal:     General: Bowel sounds are normal.     Palpations: Abdomen is soft.  Skin:    General: Skin is warm and dry.  Neurological:     Mental Status: She is alert and oriented to person, place, and time.  Latest Ref Rng & Units 07/07/2023    9:53 AM  CMP  Glucose 70 - 99 mg/dL 409   BUN 8 - 23 mg/dL 16   Creatinine 8.11 - 1.00 mg/dL 9.14   Sodium 782 - 956 mmol/L 135   Potassium 3.5 - 5.1 mmol/L 3.8   Chloride 98 - 111 mmol/L 103   CO2 22 - 32 mmol/L 23   Calcium 8.9 -  10.3 mg/dL 8.5       Latest Ref Rng & Units 07/07/2023    9:53 AM  CBC  WBC 4.0 - 10.5 K/uL 5.5   Hemoglobin 12.0 - 15.0 g/dL 7.6   Hematocrit 21.3 - 46.0 % 25.9   Platelets 150 - 400 K/uL 360      Assessment and plan- Patient is a 83 y.o. female with history of right lower extremity DVT and recent iron deficiency anemia and referred mainly for anemia  History of right lower extremity DVT: Based on history this appears to be provoked back in 2021 following hip surgery.  Patient remained on Eliquis since then.  Her most recent Lower extremity Doppler in December 2024 showed evidence of chronic DVT and no acute DVT.  Moreover given her repeated episodes of iron deficiency anemia that were significant enough to need hospitalization and blood transfusions I think it would be prudent to remain off Eliquis at this time.  I also do not see any reason to put in a IVC filter in the absence of recurrent thrombosis as the filter itself acts as a nidus for future clots.  Hypercoagulable workup at this time will not change management and therefore not indicated.  With regards to iron deficiency anemia: Her iron studies were done back in October 2024 which were indicative of iron deficiency.  Subsequently patient has received blood transfusions with the most recent one given on 07/07/2023 and therefore iron studies today will not be interpretable in the setting of recent blood transfusion.  I plan to proceed with 5 doses of Venofer at this time.  Discussed risks and benefits of Venofer including all but not limited to possible risk of infusion reaction.  Patient understands and agrees to proceed as planned.  I will repeat CBC ferritin and iron studies B12 and folate in 2 months and see her thereafter   Thank you for this kind referral and the opportunity to participate in the care of this patient   Visit Diagnosis 1. Other iron deficiency anemia   2. History of DVT of lower extremity     Dr. Owens Shark,  MD, MPH [facility] [phone] 07/26/2023

## 2023-07-27 LAB — CULTURE, URINE COMPREHENSIVE

## 2023-07-28 ENCOUNTER — Ambulatory Visit (INDEPENDENT_AMBULATORY_CARE_PROVIDER_SITE_OTHER): Payer: Medicare HMO | Admitting: Nurse Practitioner

## 2023-07-28 ENCOUNTER — Encounter: Payer: Self-pay | Admitting: Oncology

## 2023-07-28 ENCOUNTER — Encounter (INDEPENDENT_AMBULATORY_CARE_PROVIDER_SITE_OTHER): Payer: Self-pay | Admitting: Nurse Practitioner

## 2023-07-28 VITALS — BP 149/84 | HR 82 | Resp 16

## 2023-07-28 DIAGNOSIS — I10 Essential (primary) hypertension: Secondary | ICD-10-CM

## 2023-07-28 DIAGNOSIS — Z86718 Personal history of other venous thrombosis and embolism: Secondary | ICD-10-CM

## 2023-07-28 DIAGNOSIS — M7989 Other specified soft tissue disorders: Secondary | ICD-10-CM

## 2023-07-29 ENCOUNTER — Encounter (INDEPENDENT_AMBULATORY_CARE_PROVIDER_SITE_OTHER): Payer: Self-pay | Admitting: Nurse Practitioner

## 2023-07-29 NOTE — Progress Notes (Unsigned)
Subjective:    Patient ID: Stacy Allen, female    DOB: [birth year], 83 y.o.   MRN: [id number] Chief Complaint  Patient presents with  . Follow-up    6 week follow up    Stacy Allen is an 83 year old female who presents today with primary care provider Lavinia Sharps in order to evaluate for place to prevent DVT.  The patient was on longstanding Eliquis 5 mg twice daily when she began to suffer GI bleed.  Once the GI bleed was reasonable she still continued to have a low hemoglobin level and it is felt that it may be related to her very large hiatal hernia that she has.  Given the bleeding she has not been on Eliquis and thankfully she has not had any recurrence of DVTs that she has not been on it.  Currently there is no evidence of significant swelling or evidence of possible thrombus.   Review of Systems  Cardiovascular:  Positive for leg swelling.  All other systems reviewed and are negative.      Objective:   Physical Exam Vitals reviewed.  HENT:     Head: Normocephalic.  Pulmonary:     Effort: Pulmonary effort is normal.  Skin:    General: Skin is warm and dry.  Neurological:     Mental Status: She is alert and oriented to person, place, and time.  Psychiatric:        Mood and Affect: Mood normal.        Behavior: Behavior normal.    BP (!) 149/84   Pulse 82   Resp 16   Past Medical History:  Diagnosis Date  . Adenomatous polyps   . Allergic state   . Anemia   . Anginal pain (HCC)   . Anxiety   . Arthritis    osteoarthritis  . Broken rib    left  . Cancer (HCC)    skin  . CHF (congestive heart failure) (HCC)   . Chicken pox   . Complication of anesthesia    respiratory distress after rectocele surgery  . Coronary artery disease   . Depression   . Eczema   . Fibromyalgia   . GERD (gastroesophageal reflux disease)   . Headache    migraines  . Hemorrhoids   . History of hiatal hernia   . Hypercalcemia   . Hyperlipidemia   . Hypertension   .  Hypothyroidism   . Lumbar stenosis   . Obesity   . Peptic ulcer disease   . Shortness of breath dyspnea   . Sleep apnea    No CPAP  . Vertigo   . Vitamin D deficiency     Social History   Socioeconomic History  . Marital status: Widowed    Spouse name: Not on file  . Number of children: Not on file  . Years of education: Not on file  . Highest education level: Not on file  Occupational History  . Occupation: Retired  Tobacco Use  . Smoking status: Former    Current packs/day: 0.00    Types: Cigarettes    Quit date: 2000    Years since quitting: 25.1    Passive exposure: Past  . Smokeless tobacco: Never  Vaping Use  . Vaping status: Never Used  Substance and Sexual Activity  . Alcohol use: No  . Drug use: No  . Sexual activity: Not Currently  Other Topics Concern  . Not on file  Social History Narrative   Lives  in camper in nephew's backyard. No pets.   Social Drivers of Corporate investment banker Strain: Low Risk  (06/02/2023)   Received [facility]   Overall Financial Resource Strain (CARDIA)   . Difficulty of Paying Living Expenses: Not hard at all  Food Insecurity: No Food Insecurity (07/26/2023)   Hunger Vital Sign   . Worried About Programme researcher, broadcasting/film/video in the Last Year: Never true   . Ran Out of Food in the Last Year: Never true  Transportation Needs: No Transportation Needs (07/26/2023)   PRAPARE - Transportation   . Lack of Transportation (Medical): No   . Lack of Transportation (Non-Medical): No  Physical Activity: Not on file  Stress: Not on file  Social Connections: Not on file  Intimate Partner Violence: Not At Risk (07/26/2023)   Humiliation, Afraid, Rape, and Kick questionnaire   . Fear of Current or Ex-Partner: No   . Emotionally Abused: No   . Physically Abused: No   . Sexually Abused: No    Past Surgical History:  Procedure Laterality Date  . ABDOMINAL HYSTERECTOMY    . BACK SURGERY  2007   Dr. Gerrit Heck, [facility],  Spinal Fusion  . CARDIAC CATHETERIZATION N/A 04/03/2015   Procedure: Left Heart Cath and Coronary Angiography;  Surgeon: Dalia Heading, MD;  Location: [facility] INVASIVE CV LAB;  Service: Cardiovascular;  Laterality: N/A;  . CATARACT EXTRACTION W/PHACO Left 04/22/2019   Procedure: CATARACT EXTRACTION PHACO AND INTRAOCULAR LENS PLACEMENT (IOC) LEFT  00:45.4  12.9%  6.03;  Surgeon: Nevada Crane, MD;  Location: [facility] SURGERY CNTR;  Service: Ophthalmology;  Laterality: Left;  Latex sleep apnea  . CATARACT EXTRACTION W/PHACO Right 07/01/2019   Procedure: CATARACT EXTRACTION PHACO AND INTRAOCULAR LENS PLACEMENT (IOC) RIGHT 2.38  00:24.4;  Surgeon: Nevada Crane, MD;  Location: [facility] SURGERY CNTR;  Service: Ophthalmology;  Laterality: Right;  Latex Sleep apnea  . CHOLECYSTECTOMY    . COLONOSCOPY WITH PROPOFOL N/A 04/21/2023   Procedure: COLONOSCOPY WITH PROPOFOL;  Surgeon: Benancio Deeds, MD;  Location: [facility] ENDOSCOPY;  Service: Gastroenterology;  Laterality: N/A;  . CORONARY ANGIOPLASTY  2015   Dr. Lady Gary, [facility] Cath Lab  . ESOPHAGOGASTRODUODENOSCOPY (EGD) WITH PROPOFOL N/A 12/21/2016   Procedure: ESOPHAGOGASTRODUODENOSCOPY (EGD) WITH PROPOFOL;  Surgeon: Scot Jun, MD;  Location: [facility] ENDOSCOPY;  Service: Endoscopy;  Laterality: N/A;  . ESOPHAGOGASTRODUODENOSCOPY (EGD) WITH PROPOFOL N/A 12/08/2022   Procedure: ESOPHAGOGASTRODUODENOSCOPY (EGD) WITH PROPOFOL;  Surgeon: Jaynie Collins, DO;  Location: [facility] ENDOSCOPY;  Service: Gastroenterology;  Laterality: N/A;  . ESOPHAGOGASTRODUODENOSCOPY (EGD) WITH PROPOFOL N/A 04/19/2023   Procedure: ESOPHAGOGASTRODUODENOSCOPY (EGD) WITH PROPOFOL;  Surgeon: Benancio Deeds, MD;  Location: [facility] ENDOSCOPY;  Service: Gastroenterology;  Laterality: N/A;  . EYE SURGERY    . FRACTURE SURGERY Left 1991   Fractures Femur, [facility]  . GIVENS CAPSULE STUDY N/A 06/22/2023   Procedure: GIVENS CAPSULE STUDY;  Surgeon: Midge Minium, MD;   Location: [facility] ENDOSCOPY;  Service: Endoscopy;  Laterality: N/A;  . HEMOSTASIS CLIP PLACEMENT  04/19/2023   Procedure: HEMOSTASIS CLIP PLACEMENT;  Surgeon: Benancio Deeds, MD;  Location: [facility] ENDOSCOPY;  Service: Gastroenterology;;  . HIP ARTHROPLASTY Right 05/07/2020   Procedure: ARTHROPLASTY RIGHT HIP (HEMIARTHROPLASTY);  Surgeon: Juanell Fairly, MD;  Location: [facility] ORS;  Service: Orthopedics;  Laterality: Right;  . IMAGE GUIDED SINUS SURGERY Bilateral 09/28/2015   Procedure: IMAGE GUIDED SINUS SURGERY, SEPTOPLASTY, BILATERAL FRONTAL SINUSOTOMIES, BILATERAL MAXILLARY ANTROSTOMIES, BILATERAL TOTAL ETHMOIDECTOMY, BILATERAL SPHENOIDECTOMY, BILATERAL INFERIOR TURBINATE  REDUCTION;  Surgeon: Vernie Murders, MD;  Location: [facility] ORS;  Service: ENT;  Laterality: Bilateral;  . LEFT HEART CATH AND CORONARY ANGIOGRAPHY Left 01/19/2023   Procedure: LEFT HEART CATH AND CORONARY ANGIOGRAPHY;  Surgeon: Marcina Millard, MD;  Location: [facility] INVASIVE CV LAB;  Service: Cardiovascular;  Laterality: Left;  . POLYPECTOMY  04/19/2023   Procedure: POLYPECTOMY;  Surgeon: Benancio Deeds, MD;  Location: [facility] ENDOSCOPY;  Service: Gastroenterology;;  . POLYPECTOMY  04/21/2023   Procedure: POLYPECTOMY;  Surgeon: Benancio Deeds, MD;  Location: [facility] ENDOSCOPY;  Service: Gastroenterology;;  . RECTOCELE REPAIR      Family History  Problem Relation Age of Onset  . Hypertension Mother   . Cancer Mother   . Diabetes Father   . Hypertension Father   . Heart failure Father   . Prostate cancer Brother   . Breast cancer Neg Hx     Allergies  Allergen Reactions  . Azithromycin Diarrhea and Rash    "fainting"  Other reaction(s): Syncope  "fainting"    Other reaction(s): NOT KNOWN  "fainting"  . Meloxicam Diarrhea  . Morphine Other (See Comments) and Rash    Other reaction(s): Unknown Retain urine Other reaction(s): NOT KNOWN, Other (See Comments) Retain urine Other reaction(s): side effect of severe  rash  . Adhesive [Tape]     blisters  . Codeine Nausea And Vomiting  . Lactose Intolerance (Gi) Other (See Comments)    "stomach pain"  . Red Dye #40 (Allura Red) Itching    "red food dye"  . Shellfish-Derived Products Itching and Swelling  . Aspirin Rash  . Latex Rash    With extended contact  . Lodine [Etodolac] Rash  . Morphine And Codeine Hives and Nausea And Vomiting    Other reaction(s): UNKNOWN  . Penicillins Rash and Hives    Given on 05/07/20.  No reaction noted.  . Pravastatin Rash       Latest Ref Rng & Units 07/07/2023    9:53 AM 06/23/2023    5:17 AM 06/22/2023    4:42 AM  CBC  WBC 4.0 - 10.5 K/uL 5.5   4.8   Hemoglobin 12.0 - 15.0 g/dL 7.6  8.0  8.4   Hematocrit 36.0 - 46.0 % 25.9  26.3  27.0   Platelets 150 - 400 K/uL 360   272       CMP     Component Value Date/Time   NA 135 07/07/2023 0953   NA 131 (L) 10/18/2017 1441   NA 136 09/13/2012 1157   K 3.8 07/07/2023 0953   K 3.3 (L) 09/13/2012 1157   CL 103 07/07/2023 0953   CL 103 09/13/2012 1157   CO2 23 07/07/2023 0953   CO2 24 09/13/2012 1157   GLUCOSE 114 (H) 07/07/2023 0953   GLUCOSE 106 (H) 09/13/2012 1157   BUN 16 07/07/2023 0953   BUN 18 10/18/2017 1441   BUN 10 09/13/2012 1157   CREATININE 0.77 07/07/2023 0953   CREATININE 0.85 09/13/2012 1157   CALCIUM 8.5 (L) 07/07/2023 0953   CALCIUM 8.6 09/13/2012 1157   PROT 6.6 06/20/2023 1232   PROT 7.2 10/18/2017 1441   PROT 7.3 09/13/2012 1157   ALBUMIN 3.4 (L) 06/20/2023 1232   ALBUMIN 4.1 10/18/2017 1441   ALBUMIN 3.5 09/13/2012 1157   AST 16 06/20/2023 1232   AST 15 09/13/2012 1157   ALT 11 06/20/2023 1232   ALT 19 09/13/2012 1157   ALKPHOS 66 06/20/2023 1232   ALKPHOS 100 09/13/2012  1157   BILITOT 0.6 06/20/2023 1232   BILITOT 0.6 10/18/2017 1441   BILITOT 0.6 09/13/2012 1157   GFRNONAA >60 07/07/2023 0953   GFRNONAA >60 11/20/2011 1229     No results found.     Assessment & Plan:   1. History of DVT (deep vein  thrombosis) (Primary) Based on the patient's recent episode anticoagulation is can be absolutely contraindicated for her.  I discussed that unfortunately there is no way to prevent a DVT but a pulmonary embolism can be prevented with an IVC filter.  However because the patient does not have any active thrombus and IVC filter is not necessarily indicated at this time.  Presence of an IVC filter can actually cause some issues such as clotting within the filter itself if it is in for prolonged time frames.  Therefore based on this we will have the patient engage with conservative therapy for any postphlebitic symptoms.  She will follow-up with [place] in 6 months or sooner if issues arise.  2. Essential hypertension ***   Current Outpatient Medications on File Prior to Visit  Medication Sig Dispense Refill  . acetaminophen (TYLENOL) 500 MG tablet Take 500 mg by mouth every 6 (six) hours as needed.    [name] albuterol (PROVENTIL HFA;VENTOLIN HFA) 108 (90 BASE) MCG/ACT inhaler Inhale 2 puffs into the lungs every 6 (six) hours as needed for wheezing or shortness of breath.    . carboxymethylcellulose (REFRESH PLUS) 0.5 % SOLN Place 1 drop into both eyes daily as needed (dry eyes).    . citalopram (CELEXA) 20 MG tablet Take 20 mg by mouth daily.    . clonazePAM (KLONOPIN) 1 MG tablet Take 1 mg by mouth at bedtime.    [name] EPINEPHrine 0.3 mg/0.3 mL IJ SOAJ injection Inject 0.3 mg into the muscle once.    . Fluticasone-Salmeterol (ADVAIR HFA IN) Inhale 1 puff into the lungs daily as needed.    . gabapentin (NEURONTIN) 600 MG tablet Take 600-1,200 mg by mouth See admin instructions. Take 1 tablet (600mg ) by mouth in the morning and 2 tablets (1200mg ) by mouth at bedtime    . levothyroxine (SYNTHROID) 150 MCG tablet Take 1 tablet (150 mcg total) by mouth daily.    [name] omeprazole (PRILOSEC) 40 MG capsule Take 1 capsule (40 mg total) by mouth 2 (two) times daily before a meal. 60 capsule 1  . ondansetron (ZOFRAN-ODT) 4 MG  disintegrating tablet Take 1 tablet (4 mg total) by mouth every 8 (eight) hours as needed for nausea or vomiting. 20 tablet 0  . tiZANidine (ZANAFLEX) 4 MG tablet Take 4 mg by mouth at bedtime as needed for muscle spasms. Only uses 1/4 of medication    . trimethoprim (TRIMPEX) 100 MG tablet Take 1 tablet (100 mg total) by mouth daily. 30 tablet 11  . amLODipine (NORVASC) 5 MG tablet Take 5 mg by mouth daily. (Patient not taking: Reported on 07/28/2023)    . cefUROXime (CEFTIN) 250 MG tablet Take 1 tablet (250 mg total) by mouth 2 (two) times daily with a meal. (Patient not taking: Reported on 07/28/2023) 14 tablet 0  . FEROSUL 325 (65 Fe) MG tablet Take 325 mg by mouth daily as needed (when tolerable). (Patient not taking: Reported on 07/26/2023)    . metoprolol tartrate (LOPRESSOR) 25 MG tablet Take 25 mg by mouth daily. (Patient not taking: Reported on 07/28/2023)    . [DISCONTINUED] loratadine (CLARITIN) 10 MG tablet Take 1 tablet (10 mg total) by mouth daily.  Take 1 tablet in the morning. As needed for itching. 30 tablet 0   No current facility-administered medications on file prior to visit.    There are no Patient Instructions on file for this visit. No follow-ups on file.   Georgiana Spinner, NP

## 2023-07-31 ENCOUNTER — Inpatient Hospital Stay: Payer: Medicare HMO | Attending: Oncology

## 2023-07-31 VITALS — BP 153/80 | HR 84 | Temp 98.0°F | Resp 18

## 2023-07-31 DIAGNOSIS — D508 Other iron deficiency anemias: Secondary | ICD-10-CM

## 2023-07-31 DIAGNOSIS — D509 Iron deficiency anemia, unspecified: Secondary | ICD-10-CM | POA: Insufficient documentation

## 2023-07-31 MED ORDER — SODIUM CHLORIDE 0.9% FLUSH
10.0000 mL | Freq: Once | INTRAVENOUS | Status: AC | PRN
  Administered 2023-07-31: 10 mL
  Filled 2023-07-31: qty 10

## 2023-07-31 MED ORDER — IRON SUCROSE 20 MG/ML IV SOLN
200.0000 mg | INTRAVENOUS | Status: DC
Start: 2023-07-31 — End: 2023-07-31
  Administered 2023-07-31: 200 mg via INTRAVENOUS
  Filled 2023-07-31: qty 10

## 2023-08-01 ENCOUNTER — Ambulatory Visit: Payer: Medicare HMO | Admitting: Clinical

## 2023-08-01 DIAGNOSIS — F419 Anxiety disorder, unspecified: Secondary | ICD-10-CM

## 2023-08-01 DIAGNOSIS — F330 Major depressive disorder, recurrent, mild: Secondary | ICD-10-CM | POA: Diagnosis not present

## 2023-08-01 NOTE — Progress Notes (Signed)
 [facility] Counselor/Therapist Progress Note  Patient ID: Stacy Allen, MRN: [id number],    Date: 08/01/2023  Time Spent: 1:33pm - 2:30pm : 57 minutes   Treatment Type: Individual Therapy  Reported Symptoms: none reported  Mental Status Exam: Appearance:  Neat and Well Groomed     Behavior: Appropriate  Motor: Normal  Speech/Language:  Clear and Coherent and Normal Rate  Affect: Appropriate  Mood: normal  Thought process: normal  Thought content:   WNL  Sensory/Perceptual disturbances:   WNL  Orientation: oriented to person, place, and situation  Attention: Good  Concentration: Good  Memory: WNL  Fund of knowledge:  Good  Insight:   Good  Judgment:  Good  Impulse Control: Good   Risk Assessment: Danger to Self:  No Patient denied current suicidal ideation  Self-injurious Behavior: No Danger to Others: No Patient denied current homicidal ideation Duty to Warn:no Physical Aggression / Violence:No  Access to Firearms a concern: No  Gang Involvement:No   Subjective: Patient stated, better in response to events since last session. Patient reported she feels the iron  infusions have been helpful. Patient reported she is still in the home with patient's niece, nephew, and sister. Patient stated, it seems like the days you (family) could be getting something done in the camper something comes up.  Patient stated, everything has been a lot better. Patient reported her niece asked patient what do you really think of me and patient stated, I told them everything that could possibly be a secret.  Patient stated, about the same in response to the outcome of patient's conversation with niece. Patient stated, so much sniping going on in the household and stated, I don't like drama. Patient reported she has told her family, I don't want to hear it. Patient reported she recently attended an appointment with a hematologist who recommended patient receive iron   infusions. Patient reported she received an iron  infusion yesterday and stated, today I'm feeling better. Patient reported two vascular doctors have indicated patient can follow up in six months and patient reported feeling happy in response. Patent stated, I feel a little hopeful. Patient reported difficulty attending the hematology appointment due to the provider being at the cancer center and patient's previous experience taking family members to an oncologist. Patient stated, that was the hardest thing for me to do in reference to walking into the cancer center. Patient stated, Im glad of course that I went, she's on the right track in reference to hematology appointment. Patient stated, I didn't write it down in response to patient's homework but stated, I can name some positives. Patient reported the following positives:  the sunshine, the promise of good weather somewhere down the road, getting it out how I feel about the hollering, being able to talk about things, finding out health wise what was wrong, being able to go shopping, walking using a walker, and patient's pastor's daybreak message.   Interventions: Cognitive Behavioral Therapy. Clinician conducted session in person at clinician's office at [facility]. Reviewed events since last session. Discussed patient's recent conversation with patient's niece, motivation for the conversation, and the outcome. Discussed family dynamics within patient's household, explored the impact on patient, and patient's response to family dynamics. Discussed patient's recent appointment with hematologist and patient's thoughts/feelings in response to physician's recommendations. Assisted patient in exploring thoughts/feelings triggered by recent visit to the cancer center. Assisted patient in reframing thoughts associated with visit to cancer center. Reviewed patient's homework to identify 1 positive  each day. Clinician requested for homework  patient continue to practice challenging thoughts and identify 1 positive for each day.    Collaboration of Care: not required at this time   Diagnosis:  Mild episode of recurrent major depressive disorder (HCC)   Anxiety disorder, unspecified type     Plan: Patient is to utilize [employer] Therapy, thought re-framing, mindfulness and coping strategies to decrease symptoms associated with their diagnosis. Frequency: bi-weekly  Modality: individual      Long-term goal:   Patient stated, I guess I want to see my being able to recognize and appreciate what I have instead of focusing on all I've lost.  Target Date: 12/01/23  Progress: progressing    Short-term goal:  Write at least one positive affirmation about herself and/or patient's environment each day Target Date: 12/01/23  Progress: progressing    Practice gratitude exercises to decrease symptoms of depression Target Date: 12/01/23  Progress: progressing    Identify, challenge, and replace negative thought patterns that contribute to feelings of depression and anxiety with positive thoughts and beliefs per patient's report Target Date: 12/01/23  Progress: progressing    Develop and implement strategies to increase self care, such as, participating in water exercise class, accepting invitations to social functions   Target Date: 12/01/23  Progress: progressing    verbalize positive statements regarding self and her ability to cope with life stressors     Target Date: 12/01/23  Progress: progressing    Darice Seats, LCSW

## 2023-08-01 NOTE — Progress Notes (Signed)
   Doree Barthel, LCSW

## 2023-08-04 ENCOUNTER — Inpatient Hospital Stay: Payer: Medicare HMO

## 2023-08-04 VITALS — BP 152/81 | HR 75 | Temp 99.7°F | Resp 18

## 2023-08-04 DIAGNOSIS — D509 Iron deficiency anemia, unspecified: Secondary | ICD-10-CM | POA: Diagnosis not present

## 2023-08-04 DIAGNOSIS — D508 Other iron deficiency anemias: Secondary | ICD-10-CM

## 2023-08-04 MED ORDER — SODIUM CHLORIDE 0.9% FLUSH
10.0000 mL | Freq: Once | INTRAVENOUS | Status: AC | PRN
  Administered 2023-08-04: 10 mL
  Filled 2023-08-04: qty 10

## 2023-08-04 MED ORDER — IRON SUCROSE 20 MG/ML IV SOLN
200.0000 mg | INTRAVENOUS | Status: DC
  Administered 2023-08-04: 200 mg via INTRAVENOUS
  Filled 2023-08-04: qty 10

## 2023-08-04 NOTE — Patient Instructions (Signed)

## 2023-08-07 ENCOUNTER — Inpatient Hospital Stay: Payer: Medicare HMO

## 2023-08-07 VITALS — BP 151/67 | HR 76 | Temp 98.0°F | Resp 18

## 2023-08-07 DIAGNOSIS — D509 Iron deficiency anemia, unspecified: Secondary | ICD-10-CM | POA: Diagnosis not present

## 2023-08-07 DIAGNOSIS — D508 Other iron deficiency anemias: Secondary | ICD-10-CM

## 2023-08-07 MED ORDER — IRON SUCROSE 20 MG/ML IV SOLN
200.0000 mg | INTRAVENOUS | Status: DC
  Administered 2023-08-07: 200 mg via INTRAVENOUS

## 2023-08-07 NOTE — Patient Instructions (Signed)

## 2023-08-09 ENCOUNTER — Ambulatory Visit (INDEPENDENT_AMBULATORY_CARE_PROVIDER_SITE_OTHER): Payer: Medicare HMO | Admitting: Primary Care

## 2023-08-09 ENCOUNTER — Encounter: Payer: Self-pay | Admitting: Primary Care

## 2023-08-09 VITALS — BP 138/80 | HR 78 | Temp 97.0°F | Ht 62.0 in | Wt 227.0 lb

## 2023-08-09 DIAGNOSIS — I10 Essential (primary) hypertension: Secondary | ICD-10-CM | POA: Diagnosis not present

## 2023-08-09 DIAGNOSIS — J449 Chronic obstructive pulmonary disease, unspecified: Secondary | ICD-10-CM

## 2023-08-09 DIAGNOSIS — R7303 Prediabetes: Secondary | ICD-10-CM | POA: Diagnosis not present

## 2023-08-09 DIAGNOSIS — Z8719 Personal history of other diseases of the digestive system: Secondary | ICD-10-CM

## 2023-08-09 DIAGNOSIS — F411 Generalized anxiety disorder: Secondary | ICD-10-CM

## 2023-08-09 DIAGNOSIS — M51362 Other intervertebral disc degeneration, lumbar region with discogenic back pain and lower extremity pain: Secondary | ICD-10-CM | POA: Diagnosis not present

## 2023-08-09 DIAGNOSIS — E039 Hypothyroidism, unspecified: Secondary | ICD-10-CM

## 2023-08-09 DIAGNOSIS — G894 Chronic pain syndrome: Secondary | ICD-10-CM | POA: Diagnosis not present

## 2023-08-09 DIAGNOSIS — N390 Urinary tract infection, site not specified: Secondary | ICD-10-CM

## 2023-08-09 DIAGNOSIS — Z86718 Personal history of other venous thrombosis and embolism: Secondary | ICD-10-CM

## 2023-08-09 DIAGNOSIS — D500 Iron deficiency anemia secondary to blood loss (chronic): Secondary | ICD-10-CM

## 2023-08-09 MED ORDER — TIZANIDINE HCL 4 MG PO TABS: 4.0000 mg | ORAL_TABLET | Freq: Three times a day (TID) | ORAL | 0 refills | Status: DC | PRN

## 2023-08-09 MED ORDER — HYDROXYZINE HCL 10 MG PO TABS
10.0000 mg | ORAL_TABLET | Freq: Every evening | ORAL | 0 refills | Status: DC | PRN
Start: 2023-08-09 — End: 2024-03-18

## 2023-08-09 NOTE — Assessment & Plan Note (Signed)
Following with hematology, reviewed office notes from January 2025.   Repeat labs pending per hematology. Continue iron infusions.

## 2023-08-09 NOTE — Progress Notes (Signed)
Subjective:    Patient ID: Stacy Allen, female    DOB: [birth year], 83 y.o.   MRN: [id number]  HPI  Stacy Allen is a very pleasant 83 y.o. female who presents today who presents today to establish care and discuss the problems mentioned below. Will obtain/review records.  1) Hypertension/CAD with STENT placement: Previously managed on amlodipine 5 mg daily for which she has not had in several months. She is checking her BP at home which runs 140-150/70-90.   Following with cardiology, last office visit was in August 2024, underwent cardiac catheterization in August 2024 which was "normal" per patient.   BP Readings from Last 3 Encounters:  08/09/23 138/80  08/07/23 (!) 151/67  08/04/23 (!) 152/81     2) Hypothyroidism: Currently on levothyroxine 125 mcg daily.   She is taking levothyroxine (thyroid medication) every morning on an empty stomach with water only.   No food or other medications for 30 minutes.   No heartburn medication, iron pills, calcium, vitamin D, or magnesium pills within four hours of taking levothyroxine.   Last TSH was 0.222 in October 2024.   3) Chronic Joint Pain: Currently managed on tizanidine 4 mg PRN, gabapentin 600 mg in AM and 1200 mg HS.  History of right hip fracture with repair, continues to experience pain to the site. Also with chronic shoulder pain, history of left rib fractures, left femur fracture.   Following with orthopedics through [facility], last visit was in November 2024. Prescribed cyclobenzaprine 5 mg at the time. She is now managed on tizanidine. She underwent xray of the left shoulder on 05/15/23 which revealed spurring and degenerative changes to the [facility] joint.   4) History of DVT/Chronic Anemia/GI Bleed: Following with vascular services, last office visit was in January 2025.  During this visit IVC filter was discussed. Previously managed on Eliquis 5 mg twice daily long-term, but this was discontinued due to GI bleed and  anemia.  It was decided to forego IVC placement and focus on conservative therapy for postphlebitic symptoms.  Evaluated by hematology in January 2025 for iron deficiency anemia from prior GI bleeds and chronic anemia. She will receive her last iron infusion later this week. She continues to feel fatigued. Last hemoglobin level was 9.1 in January 2025. She is set up to have repeat labs again.   5) Recurrent UTI: Following with Urology. Managed on trimethoprim 100 mg daily for prevention.   6) Chronic Anxiety: Currently managed on clonazepam 1 mg HS for which she has been taking 20 years at bedtime for anxiety and sleep. She is also managed on citalopram 20 mg daily for which she has been taking for years.   She continues to struggle with chronic anxiety with symptoms of feeling overwhelmed, feeling on edge, feeling anxious. She has a lot of anxiety due to her living situation, is living with numerous people and 4 dogs. She plans on moving out soon.   History of hospitalization in psychiatric ward.  Has tried and failed numerous medications previously.  Was initiated on lithium at 1 point but was noted that lithium levels were never detected.  She follows with a therapist regularly.  7) COPD: Evaluated by pulmonology on 06/02/2023 through [facility] for symptoms of progressive dyspnea.  Evaluated by cardiology previously who suspected symptoms to be secondary to pulmonary cause.  During this visit she was prescribed torsemide 20 mg daily, albuterol inhaler as needed, methylprednisolone Dosepak.  She is currently using albuterol inhaler  as needed only.  No follow-up since.  Review of Systems  Respiratory:  Negative for shortness of breath.   Cardiovascular:  Negative for chest pain.  Gastrointestinal:  Negative for blood in stool, constipation and diarrhea.  Musculoskeletal:  Positive for arthralgias and back pain.  Neurological:  Negative for dizziness.  Psychiatric/Behavioral:  The patient  is nervous/anxious.          Past Medical History:  Diagnosis Date   Acute GI bleeding 04/18/2023   Adenomatous polyps    Allergic state    Anemia    Anginal pain (HCC)    Anticoagulated 04/18/2023   Anxiety    Arthritis    osteoarthritis   Benign neoplasm of colon 04/21/2023   Broken rib    left   C. difficile diarrhea 10/21/2021   Cancer (HCC)    skin   CHF (congestive heart failure) (HCC)    Chicken pox    Complication of anesthesia    respiratory distress after rectocele surgery   Coronary artery disease    Depression    Eczema    Fibromyalgia    Gastric polyp 04/19/2023   GERD (gastroesophageal reflux disease)    GI bleed 04/18/2023   Headache    migraines   Hemorrhoids    History of hiatal hernia    Hypercalcemia    Hyperlipidemia    Hypertension    Hypothyroidism    Lumbar spondylosis 06/01/2017   Lumbar stenosis    Melena 04/19/2023   Obesity    Peptic ulcer disease    Shortness of breath dyspnea    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   Sleep apnea    No CPAP   Symptomatic anemia 04/18/2023   Vertigo    Vitamin D deficiency     Social History   Socioeconomic History   Marital status: Widowed    Spouse name: Not on file   Number of children: Not on file   Years of education: Not on file   Highest education level: Not on file  Occupational History   Occupation: Retired  Tobacco Use   Smoking status: Former    Current packs/day: 0.00    Types: Cigarettes    Quit date: 2000    Years since quitting: 25.1    Passive exposure: Past   Smokeless tobacco: Never  Vaping Use   Vaping status: Never Used  Substance and Sexual Activity   Alcohol use: No   Drug use: No   Sexual activity: Not Currently  Other Topics Concern   Not on file  Social History Narrative   Lives in camper in nephew's backyard. No pets.   Social Drivers of Corporate investment banker Strain: Low Risk  (06/02/2023)   Received from [facility]    Overall Financial Resource Strain (CARDIA)    Difficulty of Paying Living Expenses: Not hard at all  Food Insecurity: No Food Insecurity (07/26/2023)   Hunger Vital Sign    Worried About Running Out of Food in the Last Year: Never true    Ran Out of Food in the Last Year: Never true  Transportation Needs: No Transportation Needs (07/26/2023)   PRAPARE - Administrator, Civil Service (Medical): No    Lack of Transportation (Non-Medical): No  Physical Activity: Not on file  Stress: Not on file  Social Connections: Not on file  Intimate Partner Violence: Not At Risk (07/26/2023)   Humiliation, Afraid, Rape, and Kick questionnaire    Fear  of Current or Ex-Partner: No    Emotionally Abused: No    Physically Abused: No    Sexually Abused: No    Past Surgical History:  Procedure Laterality Date   ABDOMINAL HYSTERECTOMY     BACK SURGERY  2007   Dr. Gerrit Heck, [facility], Spinal Fusion   CARDIAC CATHETERIZATION N/A 04/03/2015   Procedure: Left Heart Cath and Coronary Angiography;  Surgeon: Dalia Heading, MD;  Location: [facility] INVASIVE CV LAB;  Service: Cardiovascular;  Laterality: N/A;   CATARACT EXTRACTION W/PHACO Left 04/22/2019   Procedure: CATARACT EXTRACTION PHACO AND INTRAOCULAR LENS PLACEMENT (IOC) LEFT  00:45.4  12.9%  6.03;  Surgeon: Nevada Crane, MD;  Location: [facility];  Service: Ophthalmology;  Laterality: Left;  Latex sleep apnea   CATARACT EXTRACTION W/PHACO Right 07/01/2019   Procedure: CATARACT EXTRACTION PHACO AND INTRAOCULAR LENS PLACEMENT (IOC) RIGHT 2.38  00:24.4;  Surgeon: Nevada Crane, MD;  Location: [facility]ERY [facility];  Service: Ophthalmology;  Laterality: Right;  Latex Sleep apnea   CHOLECYSTECTOMY     COLONOSCOPY WITH PROPOFOL N/A 04/21/2023   Procedure: COLONOSCOPY WITH PROPOFOL;  Surgeon: Benancio Deeds, MD;  Location: [facility] ENDOSCOPY;  Service: Gastroenterology;  Laterality: N/A;   CORONARY ANGIOPLASTY  2015   Dr. Lady Gary, [facility] Cath Lab    ESOPHAGOGASTRODUODENOSCOPY (EGD) WITH PROPOFOL N/A 12/21/2016   Procedure: ESOPHAGOGASTRODUODENOSCOPY (EGD) WITH PROPOFOL;  Surgeon: Scot Jun, MD;  Location: [facility] ENDOSCOPY;  Service: Endoscopy;  Laterality: N/A;   ESOPHAGOGASTRODUODENOSCOPY (EGD) WITH PROPOFOL N/A 12/08/2022   Procedure: ESOPHAGOGASTRODUODENOSCOPY (EGD) WITH PROPOFOL;  Surgeon: Jaynie Collins, DO;  Location: [facility] ENDOSCOPY;  Service: Gastroenterology;  Laterality: N/A;   ESOPHAGOGASTRODUODENOSCOPY (EGD) WITH PROPOFOL N/A 04/19/2023   Procedure: ESOPHAGOGASTRODUODENOSCOPY (EGD) WITH PROPOFOL;  Surgeon: Benancio Deeds, MD;  Location: [facility] ENDOSCOPY;  Service: Gastroenterology;  Laterality: N/A;   EYE SURGERY     FRACTURE SURGERY Left 1991   Fractures Femur, [facility]   GIVENS CAPSULE STUDY N/A 06/22/2023   Procedure: GIVENS CAPSULE STUDY;  Surgeon: Midge Minium, MD;  Location: [facility] ENDOSCOPY;  Service: Endoscopy;  Laterality: N/A;   HEMOSTASIS CLIP PLACEMENT  04/19/2023   Procedure: HEMOSTASIS CLIP PLACEMENT;  Surgeon: Benancio Deeds, MD;  Location: [facility] ENDOSCOPY;  Service: Gastroenterology;;   HIP ARTHROPLASTY Right 05/07/2020   Procedure: ARTHROPLASTY RIGHT HIP (HEMIARTHROPLASTY);  Surgeon: Juanell Fairly, MD;  Location: [facility] ORS;  Service: Orthopedics;  Laterality: Right;   IMAGE GUIDED SINUS SURGERY Bilateral 09/28/2015   Procedure: IMAGE GUIDED SINUS SURGERY, SEPTOPLASTY, BILATERAL FRONTAL SINUSOTOMIES, BILATERAL MAXILLARY ANTROSTOMIES, BILATERAL TOTAL ETHMOIDECTOMY, BILATERAL SPHENOIDECTOMY, BILATERAL INFERIOR TURBINATE REDUCTION;  Surgeon: Vernie Murders, MD;  Location: [facility] ORS;  Service: ENT;  Laterality: Bilateral;   LEFT HEART CATH AND CORONARY ANGIOGRAPHY Left 01/19/2023   Procedure: LEFT HEART CATH AND CORONARY ANGIOGRAPHY;  Surgeon: Marcina Millard, MD;  Location: [facility] INVASIVE CV LAB;  Service: Cardiovascular;  Laterality: Left;   POLYPECTOMY  04/19/2023   Procedure: POLYPECTOMY;   Surgeon: Benancio Deeds, MD;  Location: [facility] ENDOSCOPY;  Service: Gastroenterology;;   POLYPECTOMY  04/21/2023   Procedure: POLYPECTOMY;  Surgeon: Benancio Deeds, MD;  Location: [facility] ENDOSCOPY;  Service: Gastroenterology;;   RECTOCELE REPAIR      Family History  Problem Relation Age of Onset   Hypertension Mother    Cancer Mother    Diabetes Father    Hypertension Father    Heart failure Father    Prostate cancer Brother    Breast cancer Neg Hx  Allergies  Allergen Reactions   Azithromycin Diarrhea and Rash    "fainting"  Other reaction(s): Syncope  "fainting"    Other reaction(s): NOT KNOWN  "fainting"   Meloxicam Diarrhea   Morphine Other (See Comments) and Rash    Other reaction(s): Unknown Retain urine Other reaction(s): NOT KNOWN, Other (See Comments) Retain urine Other reaction(s): side effect of severe rash   Adhesive [Tape]     blisters   Codeine Nausea And Vomiting   Lactose Intolerance (Gi) Other (See Comments)    "stomach pain"   Red Dye #40 (Allura Red) Itching    "red food dye"   Shellfish-Derived Products Itching and Swelling   Aspirin Rash   Latex Rash    With extended contact   Lodine [Etodolac] Rash   Morphine And Codeine Hives and Nausea And Vomiting    Other reaction(s): UNKNOWN   Penicillins Rash and Hives    Given on 05/07/20.  No reaction noted.   Pravastatin Rash    Current Outpatient Medications on File Prior to Visit  Medication Sig Dispense Refill   acetaminophen (TYLENOL) 500 MG tablet Take 500 mg by mouth every 6 (six) hours as needed.     albuterol (PROVENTIL HFA;VENTOLIN HFA) 108 (90 BASE) MCG/ACT inhaler Inhale 2 puffs into the lungs every 6 (six) hours as needed for wheezing or shortness of breath.     carboxymethylcellulose (REFRESH PLUS) 0.5 % SOLN Place 1 drop into both eyes daily as needed (dry eyes).     citalopram (CELEXA) 20 MG tablet Take 20 mg by mouth daily.     clonazePAM (KLONOPIN) 1 MG tablet Take 1 mg  by mouth at bedtime.     EPINEPHrine 0.3 mg/0.3 mL IJ SOAJ injection Inject 0.3 mg into the muscle once.     Fluticasone-Salmeterol (ADVAIR HFA IN) Inhale 1 puff into the lungs daily as needed.     gabapentin (NEURONTIN) 600 MG tablet Take 600-1,200 mg by mouth See admin instructions. Take 1 tablet (600mg ) by mouth in the morning and 2 tablets (1200mg ) by mouth at bedtime     levothyroxine (SYNTHROID) 150 MCG tablet Take 1 tablet (150 mcg total) by mouth daily.     omeprazole (PRILOSEC) 40 MG capsule Take 1 capsule (40 mg total) by mouth 2 (two) times daily before a meal. 60 capsule 1   trimethoprim (TRIMPEX) 100 MG tablet Take 1 tablet (100 mg total) by mouth daily. 30 tablet 11   [DISCONTINUED] loratadine (CLARITIN) 10 MG tablet Take 1 tablet (10 mg total) by mouth daily. Take 1 tablet in the morning. As needed for itching. 30 tablet 0   No current facility-administered medications on file prior to visit.    BP 138/80   Pulse 78   Temp (!) 97 F (36.1 C) (Temporal)   Ht 5\' 2"  (1.575 m)   Wt 227 lb (103 kg)   SpO2 98%   BMI 41.52 kg/m  Objective:   Physical Exam Constitutional:      Appearance: She is not ill-appearing.  HENT:     Nose: No mucosal edema.     Right Sinus: No maxillary sinus tenderness or frontal sinus tenderness.     Left Sinus: No maxillary sinus tenderness or frontal sinus tenderness.  Cardiovascular:     Rate and Rhythm: Normal rate and regular rhythm.  Pulmonary:     Effort: Pulmonary effort is normal.     Breath sounds: Normal breath sounds. No wheezing or rhonchi.  Abdominal:  General: Bowel sounds are normal.     Palpations: Abdomen is soft.     Tenderness: There is no abdominal tenderness.  Skin:    General: Skin is warm and dry.  Neurological:     Mental Status: She is alert and oriented to person, place, and time.  Psychiatric:        Mood and Affect: Mood normal.          Assessment & Plan:  Essential hypertension Assessment &  Plan: Stable in the office today, home readings are above goal.  Remain off amlodipine 5 mg for now. Consider resuming if needed.     GAD (generalized anxiety disorder) Assessment & Plan: Seems uncontrolled.  Discussed that I do not prescribe benzodiazepine meds for daily treatment. We will start by weaning off clonazepam by reducing to 0.5 mg nightly x 2-4 weeks, then 0.25 mg weekly x 2-4 weeks, then stop.  Meanwhile, we will initiate hydroxyzine 10 mg HS with 0.5 mg dose of clonazepam. Increase hydroxyzine to 20 mg with further reduction of clonazepam.   Continue citalopram 20 mg daily.   Orders: -     hydrOXYzine HCl; Take 1-2 tablets (10-20 mg total) by mouth at bedtime as needed for anxiety.  Dispense: 90 tablet; Refill: 0  Chronic pain syndrome Assessment & Plan: Ongoing. Stable.  Continue gabapentin 600 mg in AM and 1200 mg HS. Continue tizanidine 4 mg PRN.  Orders: -     tiZANidine HCl; Take 1 tablet (4 mg total) by mouth every 8 (eight) hours as needed for muscle spasms.  Dispense: 30 tablet; Refill: 0  Degeneration of intervertebral disc of lumbar region with discogenic back pain and lower extremity pain Assessment & Plan: Stable.  Continue gabapentin 600 mg in AM, 1200 mg HS. Continue tizanidine 4 mg PRN.   Orders: -     tiZANidine HCl; Take 1 tablet (4 mg total) by mouth every 8 (eight) hours as needed for muscle spasms.  Dispense: 30 tablet; Refill: 0  Hypothyroidism, unspecified type Assessment & Plan: She is taking levothyroxine correctly. Repeat TSH pending.  Continue levothyroxine 125 mcg daily.  Orders: -     TSH  Prediabetes -     Hemoglobin A1c  Chronic obstructive pulmonary disease, unspecified COPD type (HCC) Assessment & Plan: Seems stable.  Following with pulmonology, office notes reviewed through care everywhere from December 2024.  Continue albuterol inhaler as needed.   History of DVT (deep vein thrombosis) Assessment &  Plan: Reviewed hematology and vascular office notes in January 2025.  Remain off Eliquis 5 mg BID.    Iron deficiency anemia due to chronic blood loss Assessment & Plan: Following with hematology, reviewed office notes from January 2025.   Repeat labs pending per hematology. Continue iron infusions.     Recurrent UTI Assessment & Plan: Following with Urology.  Continue trimethoprim 100 mg daily.   History of GI bleed Assessment & Plan: With recent hospitalizations in October and December 2024. Hospital notes and labs reviewed.   Continue omeprazole 40 mg BID.     60 minutes spent face to face with patient, reviewing medical records, and developing assessment and plan. See assessment and plan notes.    Doreene Nest, NP

## 2023-08-09 NOTE — Assessment & Plan Note (Signed)
Stable in the office today, home readings are above goal.  Remain off amlodipine 5 mg for now. Consider resuming if needed.

## 2023-08-09 NOTE — Assessment & Plan Note (Signed)
Following with Urology.  Continue trimethoprim 100 mg daily.

## 2023-08-09 NOTE — Assessment & Plan Note (Signed)
With recent hospitalizations in October and December 2024. Hospital notes and labs reviewed.   Continue omeprazole 40 mg BID.

## 2023-08-09 NOTE — Patient Instructions (Addendum)
We need to wean you off of your clonazepam. Start 1/2 tablet by mouth every evening at bedtime for 2-4 weeks, then reduce to 1/4 tablet every evening at bedtime.   You may try hydroxyzine 10 mg at bedtime for anxiety/sleep. You may take 1 or 2 tablets as needed for anxiety.  Stop by the lab prior to leaving today. I will notify you of your results once received.   It was a pleasure to meet you today! Please don't hesitate to contact me with any questions. Welcome to [facility]!

## 2023-08-09 NOTE — Assessment & Plan Note (Signed)
Seems uncontrolled.  Discussed that I do not prescribe benzodiazepine meds for daily treatment. We will start by weaning off clonazepam by reducing to 0.5 mg nightly x 2-4 weeks, then 0.25 mg weekly x 2-4 weeks, then stop.  Meanwhile, we will initiate hydroxyzine 10 mg HS with 0.5 mg dose of clonazepam. Increase hydroxyzine to 20 mg with further reduction of clonazepam.   Continue citalopram 20 mg daily.

## 2023-08-09 NOTE — Assessment & Plan Note (Signed)
She is taking levothyroxine correctly. Repeat TSH pending.  Continue levothyroxine 125 mcg daily.

## 2023-08-09 NOTE — Assessment & Plan Note (Signed)
Stable.  Continue gabapentin 600 mg in AM, 1200 mg HS. Continue tizanidine 4 mg PRN.

## 2023-08-09 NOTE — Assessment & Plan Note (Signed)
Ongoing. Stable.  Continue gabapentin 600 mg in AM and 1200 mg HS. Continue tizanidine 4 mg PRN.

## 2023-08-09 NOTE — Assessment & Plan Note (Signed)
Reviewed hematology and vascular office notes in January 2025.  Remain off Eliquis 5 mg BID.

## 2023-08-09 NOTE — Assessment & Plan Note (Signed)
Seems stable.  Following with pulmonology, office notes reviewed through care everywhere from December 2024.  Continue albuterol inhaler as needed.

## 2023-08-10 LAB — HEMOGLOBIN A1C: Hgb A1c MFr Bld: 5 % (ref 4.6–6.5)

## 2023-08-10 LAB — TSH: TSH: 1.46 u[IU]/mL (ref 0.35–5.50)

## 2023-08-11 ENCOUNTER — Inpatient Hospital Stay: Payer: Medicare HMO

## 2023-08-11 VITALS — BP 161/81 | HR 69 | Temp 97.8°F | Resp 16

## 2023-08-11 DIAGNOSIS — D508 Other iron deficiency anemias: Secondary | ICD-10-CM

## 2023-08-11 DIAGNOSIS — D509 Iron deficiency anemia, unspecified: Secondary | ICD-10-CM | POA: Diagnosis not present

## 2023-08-11 MED ORDER — IRON SUCROSE 20 MG/ML IV SOLN
200.0000 mg | INTRAVENOUS | Status: DC
  Administered 2023-08-11: 200 mg via INTRAVENOUS
  Filled 2023-08-11: qty 10

## 2023-08-14 ENCOUNTER — Inpatient Hospital Stay: Payer: Medicare HMO

## 2023-08-15 ENCOUNTER — Ambulatory Visit: Payer: Self-pay | Admitting: Primary Care

## 2023-08-15 NOTE — Telephone Encounter (Signed)
 Called patient and reviewed all information. Patient verbalized understanding. Will call if any further questions.

## 2023-08-15 NOTE — Telephone Encounter (Signed)
  Chief Complaint: Medication question Symptoms: headache with full dose Frequency: every time Disposition: [] ED /[] Urgent Care (no appt availability in office) / [] Appointment(In office/virtual)/ []  [facility]/ [] Home Care/ [] Refused Recommended Disposition /[] [facility]/ [x]  Follow-up with PCP Additional Notes: Patient calls stating PCP switched her to hydroxyzine, which she feels is effective however she states if she takes the full pill she wakes up the next day with a headache. States she has been cutting the pill in half and it seems to be working, but she is wanting clarification that this is appropriate. She states when a call is returned if she does not answer the information can be left on her voicemail. Care advice reviewed, alerting PCP for review and follow up.   Copied from CRM [id number]. Topic: Clinical - Medication Question >> Aug 15, 2023 10:49 AM Irine Seal wrote: Reason for CRM: Patient reports that her Hydroxyzine (Atarax) 10 mg tablet is causing dry mouth and severe headaches. She stated that she tried taking 1/2 of the dose but still wants to continue the medication because it seems to help with her sleep. Patient is requesting an adjustment in dosage to address the side effects while maintaining the benefit for sleep. She wants to know if it is okay to cut the tablet in 1/2 Reason for Disposition  [1] Caller has NON-URGENT medicine question about med that PCP prescribed AND [2] triager unable to answer question  Answer Assessment - Initial Assessment Questions 1. NAME of MEDICINE: "What medicine(s) are you calling about?"     Hydroxyzine 2. QUESTION: "What is your question?" (e.g., double dose of medicine, side effect)     States it works well, but she develops a severe headache in the morning after taking it. States she has even tried half a dose and it did not cause the headache and still worked. She wants to know if this is okay. 3. PRESCRIBER: "Who  prescribed the medicine?" Reason: if prescribed by specialist, call should be referred to that group.     PCP 4. SYMPTOMS: "Do you have any symptoms?" If Yes, ask: "What symptoms are you having?"  "How bad are the symptoms (e.g., mild, moderate, severe)     Headache with full pill, states it is every time she takes it.  Protocols used: Medication Question Call-A-AH

## 2023-08-15 NOTE — Telephone Encounter (Signed)
 Yes, okay to take 1/2 tablet of the hydroxyzine. Please thank her for the update.

## 2023-08-16 ENCOUNTER — Other Ambulatory Visit: Payer: Self-pay

## 2023-08-17 ENCOUNTER — Ambulatory Visit: Payer: Medicare HMO | Admitting: Clinical

## 2023-08-17 DIAGNOSIS — F330 Major depressive disorder, recurrent, mild: Secondary | ICD-10-CM

## 2023-08-17 DIAGNOSIS — F419 Anxiety disorder, unspecified: Secondary | ICD-10-CM | POA: Diagnosis not present

## 2023-08-17 NOTE — Progress Notes (Signed)
 [facility] Counselor/Therapist Progress Note  Patient ID: Stacy Allen Allen, MRN: [id number],    Date: 08/17/2023  Time Spent: 10:40am - 11:37am : 57 minutes  Treatment Type: Individual Therapy  Reported Symptoms: Patient reported irritability, headache, nausea  Mental Status Exam: Appearance:  Could not assess      Behavior: Could not assess  Motor: Could not assess  Speech/Language:  Clear and Coherent and Normal Rate  Affect: Could not assess  Mood: normal  Thought process: normal  Thought content:   WNL  Sensory/Perceptual disturbances:   Headache   Orientation: oriented to person, place, and situation  Attention: Good  Concentration: Good  Memory: WNL  Fund of knowledge:  Good  Insight:   Good  Judgment:  Good  Impulse Control: Good   Risk Assessment: Danger to Self:  No Patient denied current suicidal ideation  Self-injurious Behavior: No Danger to Others: No Patient denied current homicidal ideation Duty to Warn:no Physical Aggression / Violence:No  Access to Firearms a concern: No  Gang Involvement:No   Subjective: Patient reported patient's PCP is tapering patient off of clonazepam and started patient on hydroxyzine. Patient reported she has been experiencing a headache and is lethargic. Patient reported she has considered seeing a psychiatrist for medication management. Patient stated, "I can't take these headaches" and reported feeling nauseous. Patient reported she was able to move into her camper and the second night in the camper patient's heat went out and the water line froze. Patient stated, "Id rather be here (camper) than there (niece/nephew's home)". Patient reported her nephew is in the process of repairing patient's heat. Patient reported she completed her fifth infusion and stated, "I feel better as far as energy".  Patient stated, "I laid (verbally) out on Stacy Allen Allen (niece) again". Patient reported she later apologized to her niece for the way  patient spoke to niece. Patient reported her niece suggested patient purchase a building to store her belongings in. Patient stated, "I brought up everything that perturbed me about the yard sale". Patient reported her niece gave some of patient's belongings away and niece told patient if patient sees someone with her belongings to ask for the belongings be returned. Patient stated, "that set me off" in response to statement by niece. Patient stated, "I'm ok in the back yard" in reference to location of patient's camper.  Patient reported difficulty maintaining boundaries with niece due to niece's personality.  Patient stated, "I just have to stay to myself". Patient reported she plans to reach out to her PCP. Patient stated, "its good" in response to current mood. Patient stated, "I'm overwhelmed" due to multiple stressors.   Interventions: Cognitive Behavioral Therapy and supportive therapy . Clinician conducted session via phone from clinician's home office due to patient not being able to access caregility. Patient provided verbal consent to proceed with telehealth session and is aware of limitations of telephone or video visits. Patient participated in session from patient's home. Reviewed events since last session. Provided supportive therapy and active listening as patient discussed concerns related to tapering off clonazepam and current symptoms. Provided psycho education related to psychotropic medications. Discussed patient contacting patient's PCP to discuss patient's symptoms and concerns. Assisted patient in exploring and identifying trigger for recent conflict with niece. Assisted patient in exploring and identifying thoughts/feelings triggered by niece's statement regarding patient's belongings. Discussed coping strategies to utilize in response to anger, such as, taking a deep breath, going to patient's camper. Assessed patient's mood. Clinician requested for homework patient  continue to practice  challenging thoughts and identify 1 positive for each day.    Collaboration of Care: not required at this time   Diagnosis:  Mild episode of recurrent major depressive disorder (HCC)   Anxiety disorder, unspecified type     Plan: Patient is to utilize [employer] [facility], thought re-framing, mindfulness and coping strategies to decrease symptoms associated with their diagnosis. Frequency: bi-weekly  Modality: individual      Long-term goal:   Patient stated, "I guess I want to see my being able to recognize and appreciate what I have instead of focusing on all I've lost".  Target Date: 12/01/23  Progress: progressing    Short-term goal:  Write at least one positive affirmation about herself and/or patient's environment each day Target Date: 12/01/23  Progress: progressing    Practice gratitude exercises to decrease symptoms of depression Target Date: 12/01/23  Progress: progressing    Identify, challenge, and replace negative thought patterns that contribute to feelings of depression and anxiety with positive thoughts and beliefs per patient's report Target Date: 12/01/23  Progress: progressing    Develop and implement strategies to increase self care, such as, participating in water exercise class, accepting invitations to social functions   Target Date: 12/01/23  Progress: progressing    verbalize positive statements regarding self and her ability to cope with life stressors     Target Date: 12/01/23  Progress: progressing    Doree Barthel, LCSW

## 2023-08-17 NOTE — Progress Notes (Signed)
   Stacy Barthel, LCSW

## 2023-08-31 NOTE — Telephone Encounter (Signed)
 Was able to reach patient at alternative number. She denies any suicidal ideation. Just wanted to have someone re evaluate her medications does not feel it is helping as much at this time.

## 2023-09-01 ENCOUNTER — Other Ambulatory Visit: Payer: Self-pay | Admitting: Primary Care

## 2023-09-01 DIAGNOSIS — F411 Generalized anxiety disorder: Secondary | ICD-10-CM

## 2023-09-02 NOTE — Telephone Encounter (Signed)
 Please call patient:  Please find out how she's doing on the hydroxyzine for sleep. We are trying to wean her off clonazepam. Is she taking this at bedtime? 1 or 2 tabs?  How much clonazepam is she down to at night now?

## 2023-09-03 NOTE — Telephone Encounter (Signed)
 Please call patient. See refill request notes for hydroxyzine.

## 2023-09-04 NOTE — Telephone Encounter (Signed)
 Called patient and reviewed all information.  She states she was on both of these medications but does not remember why she stopped . She would like to wait to see psychiatry. She stated if they cannot see her soon she will callback regarding meds.

## 2023-09-04 NOTE — Telephone Encounter (Signed)
 Called and spoke with patient, see refill request for further documentation.

## 2023-09-04 NOTE — Telephone Encounter (Signed)
 Noted.

## 2023-09-04 NOTE — Telephone Encounter (Signed)
 Noted. Will place referral. We can try a different medication at night to help her sleep called trazodone. Another option is mirtazapine. Has she tried either of those?

## 2023-09-04 NOTE — Telephone Encounter (Signed)
 Called and spoke with patient she was unable to take hydroxyzine due to it giving her headaches. Patient is currently taking 1 clonazepam at bedtime. She states she is no longer trying to wean off of that. She is requesting referral to [facility].

## 2023-09-05 ENCOUNTER — Other Ambulatory Visit: Payer: Self-pay | Admitting: Primary Care

## 2023-09-05 DIAGNOSIS — F411 Generalized anxiety disorder: Secondary | ICD-10-CM

## 2023-09-12 ENCOUNTER — Ambulatory Visit: Payer: Medicare HMO | Admitting: Clinical

## 2023-09-12 DIAGNOSIS — F419 Anxiety disorder, unspecified: Secondary | ICD-10-CM | POA: Diagnosis not present

## 2023-09-12 DIAGNOSIS — F330 Major depressive disorder, recurrent, mild: Secondary | ICD-10-CM

## 2023-09-12 NOTE — Progress Notes (Signed)
   Stacy Barthel, LCSW

## 2023-09-12 NOTE — Progress Notes (Signed)
 [facility] Counselor/Therapist Progress Note  Patient ID: Stacy Allen, MRN: [id number],    Date: 09/12/2023  Time Spent: 1:35pm - 2:34pm : 59 minutes   Treatment Type: Individual Therapy  Reported Symptoms: Patient reported recent improvement in mood  Mental Status Exam: Appearance:  Neat and Well Groomed     Behavior: Appropriate  Motor: Normal  Speech/Language:  Clear and Coherent and Normal Rate  Affect: Appropriate  Mood: Patient reported feeling "melancholy today"  Thought process: normal  Thought content:   WNL  Sensory/Perceptual disturbances:   WNL  Orientation: oriented to person, place, and situation  Attention: Good  Concentration: Good  Memory: WNL  Fund of knowledge:  Good  Insight:   Good  Judgment:  Good  Impulse Control: Good   Risk Assessment: Danger to Self:  No Patient denied current suicidal ideation  Self-injurious Behavior: No Danger to Others: No Patient denied current homicidal ideation Duty to Warn:no Physical Aggression / Violence:No  Access to Firearms a concern: No  Gang Involvement:No   Subjective: Patient reported she has been walking 15 minutes per day. Patient stated, "I do" in response to clinician's inquiry regarding patient feeling benefit from walking daily. Patient reported feeling "melancholy" today. Patient reported today patient's niece posted information related to loss on social media. Patient reported the post triggered thoughts about losses patient has experienced throughout patient's life. Patient reported she received positive news from patient's pulmonologist and stated, "my oxygen is 97/98 percent". Patient reported pulmonologist informed patient she does not have COPD and has asthma. Patient reported pulmonologist recommended patient walk daily. Patient stated, "Ive had some real positive days". Patient reported repairs have been made to patient's camper. Patient stated, "we're fine right now" in reference to  patient's relationship with niece. Patient reported she is concerned about niece's/nephew's finances and stated, "I don't trust valerie (niece)". Patient reported patient's niece ordered groceries and reported niece stated the groceries were accidentally charged to patient's credit card twice. Patient stated, she doesn't do that well and neither do I"  in reference to discussing patient's concerns with niece. Patient stated, "She's always good to me but, theres always a but". Patient stated,"overall it has been good" in response to mood since last session. Patient identified being able to watch the sunrise and sunset fro the chair in her living room as a positive.   Interventions: Cognitive Behavioral Therapy. Clinician conducted session in person at clinician's office at [facility]. Reviewed events since last session. Discussed recent increase in physical activity and explored benefits from walking.  Assessed patient's mood. Explored and identified triggers for "melancholy" mood. Assessed the status of recent stressors and discussed current dynamics between patient and niece. Discussed patient's concerns related to niece's recent financial decisions. Discussed patient initiating a conversation with niece to discuss patient's concerns. Provided psycho education related to physical activity, mood, and mindfulness exercise of mindful seeing and smelling. Reviewed positives since last session. Clinician requested for homework patient continue to practice challenging thoughts, mindfulness, and identify 1 positive for each day.    Collaboration of Care: not required at this time   Diagnosis:  Mild episode of recurrent major depressive disorder (HCC)   Anxiety disorder, unspecified type     Plan: Patient is to utilize [employer], thought re-framing, mindfulness and coping strategies to decrease symptoms associated with their diagnosis. Frequency: bi-weekly  Modality: individual       Long-term goal:   Patient stated, "I guess I want to see  my being able to recognize and appreciate what I have instead of focusing on all I've lost".  Target Date: 12/01/23  Progress: progressing    Short-term goal:  Write at least one positive affirmation about herself and/or patient's environment each day Target Date: 12/01/23  Progress: progressing    Practice gratitude exercises to decrease symptoms of depression Target Date: 12/01/23  Progress: progressing    Identify, challenge, and replace negative thought patterns that contribute to feelings of depression and anxiety with positive thoughts and beliefs per patient's report Target Date: 12/01/23  Progress: progressing    Develop and implement strategies to increase self care, such as, participating in water exercise class, accepting invitations to social functions   Target Date: 12/01/23  Progress: progressing    verbalize positive statements regarding self and her ability to cope with life stressors     Target Date: 12/01/23  Progress: progressing     Doree Barthel, LCSW

## 2023-09-18 ENCOUNTER — Other Ambulatory Visit: Payer: Self-pay

## 2023-09-18 DIAGNOSIS — E039 Hypothyroidism, unspecified: Secondary | ICD-10-CM

## 2023-09-18 MED ORDER — LEVOTHYROXINE SODIUM 125 MCG PO TABS: ORAL_TABLET | ORAL | 2 refills | Status: AC

## 2023-09-18 NOTE — Telephone Encounter (Signed)
 Noted, Rx for levothyroxine sent to pharmacy

## 2023-09-18 NOTE — Telephone Encounter (Signed)
 Please call patient:  Received refill request for levothyroxine 125 mcg tablets from pharmacy, although she has levothyroxine 150 mcg on her medication list. I thought she was on 125 mcg. Can we confirm?

## 2023-09-18 NOTE — Telephone Encounter (Signed)
 Called and spoke with patient she says she has always been on the dose, she said the is a mistake on her list that she asked to be corrected before but never was.

## 2023-09-22 ENCOUNTER — Inpatient Hospital Stay: Payer: Medicare HMO | Attending: Oncology

## 2023-09-22 ENCOUNTER — Encounter: Payer: Self-pay | Admitting: Oncology

## 2023-09-22 ENCOUNTER — Inpatient Hospital Stay: Payer: Medicare HMO | Admitting: Oncology

## 2023-09-22 VITALS — BP 151/87 | HR 73 | Temp 98.7°F | Resp 19 | Ht 62.0 in | Wt 214.0 lb

## 2023-09-22 DIAGNOSIS — D509 Iron deficiency anemia, unspecified: Secondary | ICD-10-CM | POA: Insufficient documentation

## 2023-09-22 DIAGNOSIS — D508 Other iron deficiency anemias: Secondary | ICD-10-CM

## 2023-09-22 DIAGNOSIS — Z86718 Personal history of other venous thrombosis and embolism: Secondary | ICD-10-CM | POA: Diagnosis not present

## 2023-09-22 DIAGNOSIS — Z87891 Personal history of nicotine dependence: Secondary | ICD-10-CM | POA: Insufficient documentation

## 2023-09-22 DIAGNOSIS — E039 Hypothyroidism, unspecified: Secondary | ICD-10-CM | POA: Insufficient documentation

## 2023-09-22 LAB — RETICULOCYTES
Immature Retic Fract: 8.5 % (ref 2.3–15.9)
RBC.: 4.4 MIL/uL (ref 3.87–5.11)
Retic Count, Absolute: 52.8 10*3/uL (ref 19.0–186.0)
Retic Ct Pct: 1.2 % (ref 0.4–3.1)

## 2023-09-22 LAB — CBC (CANCER CENTER ONLY)
HCT: 39.1 % (ref 36.0–46.0)
Hemoglobin: 12.6 g/dL (ref 12.0–15.0)
MCH: 28.6 pg (ref 26.0–34.0)
MCHC: 32.2 g/dL (ref 30.0–36.0)
MCV: 88.7 fL (ref 80.0–100.0)
Platelet Count: 285 10*3/uL (ref 150–400)
RBC: 4.41 MIL/uL (ref 3.87–5.11)
RDW: 19 % — ABNORMAL HIGH (ref 11.5–15.5)
WBC Count: 7.5 10*3/uL (ref 4.0–10.5)
nRBC: 0 % (ref 0.0–0.2)

## 2023-09-22 LAB — VITAMIN B12: Vitamin B-12: 253 pg/mL (ref 180–914)

## 2023-09-22 LAB — IRON AND TIBC
Iron: 50 ug/dL (ref 28–170)
Saturation Ratios: 13 % (ref 10.4–31.8)
TIBC: 388 ug/dL (ref 250–450)
UIBC: 338 ug/dL

## 2023-09-22 LAB — FERRITIN: Ferritin: 33 ng/mL (ref 11–307)

## 2023-09-22 LAB — TSH: TSH: 1.814 u[IU]/mL (ref 0.350–4.500)

## 2023-09-22 LAB — FOLATE: Folate: 16.3 ng/mL (ref >5.9)

## 2023-09-22 NOTE — Progress Notes (Signed)
 Hematology/Oncology Consult note [facility]  Telephone:([phone] [phone]  Patient Care Team: Doreene Nest, NP as PCP - General (Internal Medicine)   Name of the patient: Stacy Allen  [id number]  [birth year]   Date of visit: 09/22/23  Diagnosis-iron deficiency anemia  Chief complaint/ Reason for visit-routine follow-up of iron deficiency anemia  Heme/Onc history: Patient is a 83 year old female with a past medical history significant for hypertension hyperlipidemia, CHF arthritis hypothyroidism among other medical problems.. She has a history of right lower extremity DVT back in 2021 following her hip surgery and she remained on Eliquis since then until her recent episode of GI bleed.  She does not recollect if she has had any other episodes of DVT prior to that. 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  She has had admissions for GI bleed in October 2024 and presented with similar complaints in December 2024.  Eliquis was discontinued she had EGD and colonoscopy in October 2024 which showed 10 cm hiatal hernia and benign-appearing gastric polyp but no evidence of bleeding.  Colonoscopy at the same time had shown polyp in the cecum and the ascending colon which were resected and were negative for malignancy.  During her hospitalization in December 2024 she also underwent a capsule endoscopy which was negative.  CBC from 07/21/2023 showed an H&H of 9.1/30 with an MCV of 82.4.  White count and platelet counts were normal.  She had ferritin and iron studies checked back in October 2024 which showed evidence of iron deficiency.  Last blood transfusion was given on 07/07/2023.  Patient received 5 doses of Venofer in February 2025   Interval history-patient reports improvement in  her energy levels after receiving IV iron.  She still has some ongoing fatigue  ECOG PS- 2 Pain scale- 0   Review of systems- Review of Systems  Constitutional:  Positive for malaise/fatigue. Negative for chills, fever and weight loss.  HENT:  Negative for congestion, ear discharge and nosebleeds.   Eyes:  Negative for blurred vision.  Respiratory:  Negative for cough, hemoptysis, sputum production, shortness of breath and wheezing.   Cardiovascular:  Negative for chest pain, palpitations, orthopnea and claudication.  Gastrointestinal:  Negative for abdominal pain, blood in stool, constipation, diarrhea, heartburn, melena, nausea and vomiting.  Genitourinary:  Negative for dysuria, flank pain, frequency, hematuria and urgency.  Musculoskeletal:  Negative for back pain, joint pain and myalgias.  Skin:  Negative for rash.  Neurological:  Negative for dizziness, tingling, focal weakness, seizures, weakness and headaches.  Endo/Heme/Allergies:  Does not bruise/bleed easily.  Psychiatric/Behavioral:  Negative for depression and suicidal ideas. The patient does not have insomnia.       Allergies  Allergen Reactions   Azithromycin Diarrhea and Rash    "fainting"  Other reaction(s): Syncope  "fainting"    Other reaction(s): NOT KNOWN  "fainting"   Meloxicam Diarrhea   Morphine Other (See Comments) and Rash    Other reaction(s): Unknown Retain urine Other reaction(s): NOT KNOWN, Other (See Comments) Retain urine Other reaction(s): side effect of severe rash   Adhesive [Tape]     blisters   Codeine Nausea And Vomiting   Lactose Intolerance (Gi) Other (See Comments)    "stomach pain"   Red Dye #40 (Allura Red) Itching    "red food dye"  Shellfish-Derived Products Itching and Swelling   Aspirin Rash   Latex Rash    With extended contact   Lodine [Etodolac] Rash   Morphine And Codeine Hives and Nausea And Vomiting    Other reaction(s): UNKNOWN   Penicillins Rash and Hives     Given on 05/07/20.  No reaction noted.   Pravastatin Rash     Past Medical History:  Diagnosis Date   Acute GI bleeding 04/18/2023   Adenomatous polyps    Allergic state    Anemia    Anginal pain (HCC)    Anticoagulated 04/18/2023   Anxiety    Arthritis    osteoarthritis   Benign neoplasm of colon 04/21/2023   Broken rib    left   C. difficile diarrhea 10/21/2021   Cancer (HCC)    skin   CHF (congestive heart failure) (HCC)    Chicken pox    Complication of anesthesia    respiratory distress after rectocele surgery   Coronary artery disease    Depression    Eczema    Fibromyalgia    Gastric polyp 04/19/2023   GERD (gastroesophageal reflux disease)    GI bleed 04/18/2023   Headache    migraines   Hemorrhoids    History of hiatal hernia    Hypercalcemia    Hyperlipidemia    Hypertension    Hypothyroidism    Lumbar spondylosis 06/01/2017   Lumbar stenosis    Melena 04/19/2023   Obesity    Peptic ulcer disease    Shortness of breath dyspnea    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 Gerrit Heck, [facility], Spinal Fusion   CARDIAC CATHETERIZATION N/A 04/03/2015   Procedure: Left Heart Cath and Coronary Angiography;  Surgeon: Dalia Heading, MD;  Location: [facility] INVASIVE CV LAB;  Service: Cardiovascular;  Laterality: N/A;   CATARACT EXTRACTION W/PHACO Left 04/22/2019   Procedure: CATARACT EXTRACTION PHACO AND INTRAOCULAR LENS PLACEMENT (IOC) LEFT  00:45.4  12.9%  6.03;  Surgeon: Nevada Crane, MD;  Location: [facility] SURGERY CNTR;  Service: Ophthalmology;  Laterality: Left;  Latex sleep apnea   CATARACT EXTRACTION W/PHACO Right 07/01/2019   Procedure: CATARACT EXTRACTION PHACO AND INTRAOCULAR LENS PLACEMENT (IOC) RIGHT 2.38  00:24.4;  Surgeon: Nevada Crane, MD;   Location: [facility] SURGERY CNTR;  Service: Ophthalmology;  Laterality: Right;  Latex Sleep apnea   CHOLECYSTECTOMY     COLONOSCOPY WITH PROPOFOL N/A 04/21/2023   Procedure: COLONOSCOPY WITH PROPOFOL;  Surgeon: Benancio Deeds, MD;  Location: [facility] ENDOSCOPY;  Service: Gastroenterology;  Laterality: N/A;   CORONARY ANGIOPLASTY  2015   Dr. Lady Gary, [facility] In[facility]   ESOPHAGOGASTRODUODENOSCOPY (EGD) WITH PROPOFOL N/A 12/21/2016   Procedure: ESOPHAGOGASTRODUODENOSCOPY (EGD) WITH PROPOFOL;  Surgeon: Scot Jun, MD;  Location: [facility] ENDOSCOPY;  Service: Endoscopy;  Laterality: N/A;   ESOPHAGOGASTRODUODENOSCOPY (EGD) WITH PROPOFOL N/A 12/08/2022   Procedure: ESOPHAGOGASTRODUODENOSCOPY (EGD) WITH PROPOFOL;  Surgeon: Jaynie Collins, DO;  Location: [facility] ENDOSCOPY;  Service: Gastroenterology;  Laterality: N/A;   ESOPHAGOGASTRODUODENOSCOPY (EGD) WITH PROPOFOL N/A 04/19/2023   Procedure: ESOPHAGOGASTRODUODENOSCOPY (EGD) WITH PROPOFOL;  Surgeon: Benancio Deeds, MD;  Location: [facility] ENDOSCOPY;  Service: Gastroenterology;  Laterality: N/A;   EYE SURGERY     FRACTURE  SURGERY Left 1991   Fractures Femur, [facility]   GIVENS CAPSULE STUDY N/A 06/22/2023   Procedure: GIVENS CAPSULE STUDY;  Surgeon: Midge Minium, MD;  Location: [facility] ENDOSCOPY;  Service: Endoscopy;  Laterality: N/A;   HEMOSTASIS CLIP PLACEMENT  04/19/2023   Procedure: HEMOSTASIS CLIP PLACEMENT;  Surgeon: Benancio Deeds, MD;  Location: [facility] ENDOSCOPY;  Service: Gastroenterology;;   HIP ARTHROPLASTY Right 05/07/2020   Procedure: ARTHROPLASTY RIGHT HIP (HEMIARTHROPLASTY);  Surgeon: Juanell Fairly, MD;  Location: [facility] ORS;  Service: Orthopedics;  Laterality: Right;   IMAGE GUIDED SINUS SURGERY Bilateral 09/28/2015   Procedure: IMAGE GUIDED SINUS SURGERY, SEPTOPLASTY, BILATERAL FRONTAL SINUSOTOMIES, BILATERAL MAXILLARY ANTROSTOMIES, BILATERAL TOTAL ETHMOIDECTOMY, BILATERAL SPHENOIDECTOMY, BILATERAL INFERIOR TURBINATE REDUCTION;   Surgeon: Vernie Murders, MD;  Location: [facility] ORS;  Service: ENT;  Laterality: Bilateral;   LEFT HEART CATH AND CORONARY ANGIOGRAPHY Left 01/19/2023   Procedure: LEFT HEART CATH AND CORONARY ANGIOGRAPHY;  Surgeon: Marcina Millard, MD;  Location: [facility] INVASIVE CV LAB;  Service: Cardiovascular;  Laterality: Left;   POLYPECTOMY  04/19/2023   Procedure: POLYPECTOMY;  Surgeon: Benancio Deeds, MD;  Location: [facility] ENDOSCOPY;  Service: Gastroenterology;;   POLYPECTOMY  04/21/2023   Procedure: POLYPECTOMY;  Surgeon: Benancio Deeds, MD;  Location: [facility] ENDOSCOPY;  Service: Gastroenterology;;   RECTOCELE REPAIR      Social History   Socioeconomic History   Marital status: Widowed    Spouse name: Not on file   Number of children: Not on file   Years of education: Not on file   Highest education level: Not on file  Occupational History   Occupation: Retired  Tobacco Use   Smoking status: Former    Current packs/day: 0.00    Types: Cigarettes    Quit date: 2000    Years since quitting: 25.2    Passive exposure: Past   Smokeless tobacco: Never  Vaping Use   Vaping status: Never Used  Substance and Sexual Activity   Alcohol use: No   Drug use: No   Sexual activity: Not Currently  Other Topics Concern   Not on file  Social History Narrative   Lives in camper in [place] backyard. No pets.   Social Drivers of Corporate investment banker Strain: Low Risk  (06/02/2023)   Received from [facility]   Overall Financial Resource Strain (CARDIA)    Difficulty of Paying Living Expenses: Not hard at all  Food Insecurity: No Food Insecurity (07/26/2023)   Hunger Vital Sign    Worried About Running Out of Food in the Last Year: Never true    Ran Out of Food in the Last Year: Never true  Transportation Needs: No Transportation Needs (07/26/2023)   PRAPARE - Administrator, Civil Service (Medical): No    Lack of Transportation (Non-Medical): No  Physical  Activity: Not on file  Stress: Not on file  Social Connections: Not on file  Intimate Partner Violence: Not At Risk (07/26/2023)   Humiliation, Afraid, Rape, and Kick questionnaire    Fear of Current or Ex-Partner: No    Emotionally Abused: No    Physically Abused: No    Sexually Abused: No    Family History  Problem Relation Age of Onset   Hypertension Mother    Cancer Mother    Diabetes Father    Hypertension Father    Heart failure Father    Prostate cancer Brother    Breast cancer Neg Hx      Current Outpatient  Medications:    fluticasone (FLONASE) 50 MCG/ACT nasal spray, Place into both nostrils., Disp: , Rfl:    acetaminophen (TYLENOL) 500 MG tablet, Take 500 mg by mouth every 6 (six) hours as needed., Disp: , Rfl:    albuterol (PROVENTIL HFA;VENTOLIN HFA) 108 (90 BASE) MCG/ACT inhaler, Inhale 2 puffs into the lungs every 6 (six) hours as needed for wheezing or shortness of breath., Disp: , Rfl:    carboxymethylcellulose (REFRESH PLUS) 0.5 % SOLN, Place 1 drop into both eyes daily as needed (dry eyes)., Disp: , Rfl:    citalopram (CELEXA) 20 MG tablet, Take 20 mg by mouth daily., Disp: , Rfl:    clonazePAM (KLONOPIN) 1 MG tablet, Take 1 mg by mouth at bedtime., Disp: , Rfl:    EPINEPHrine 0.3 mg/0.3 mL IJ SOAJ injection, Inject 0.3 mg into the muscle once., Disp: , Rfl:    Fluticasone-Salmeterol (ADVAIR HFA IN), Inhale 1 puff into the lungs daily as needed., Disp: , Rfl:    gabapentin (NEURONTIN) 600 MG tablet, Take 600-1,200 mg by mouth See admin instructions. Take 1 tablet (600mg ) by mouth in the morning and 2 tablets (1200mg ) by mouth at bedtime, Disp: , Rfl:    hydrOXYzine (ATARAX) 10 MG tablet, Take 1-2 tablets (10-20 mg total) by mouth at bedtime as needed for anxiety., Disp: 90 tablet, Rfl: 0   levothyroxine (SYNTHROID) 125 MCG tablet, Take 1 tablet by mouth every morning on an empty stomach with water only.  No food or other medications for 30 minutes., Disp: 90 tablet,  Rfl: 2   omeprazole (PRILOSEC) 40 MG capsule, Take 1 capsule (40 mg total) by mouth 2 (two) times daily before a meal., Disp: 60 capsule, Rfl: 1   tiZANidine (ZANAFLEX) 4 MG tablet, Take 1 tablet (4 mg total) by mouth every 8 (eight) hours as needed for muscle spasms., Disp: 30 tablet, Rfl: 0   trimethoprim (TRIMPEX) 100 MG tablet, Take 1 tablet (100 mg total) by mouth daily., Disp: 30 tablet, Rfl: 11  Physical exam:  Vitals:   09/22/23 1317  BP: (!) 151/87  Pulse: 73  Resp: 19  Temp: 98.7 F (37.1 C)  TempSrc: Tympanic  SpO2: 99%  Weight: 214 lb (97.1 kg)  Height: 5\' 2"  (1.575 m)   Physical Exam Constitutional:      Comments: Sitting in a wheelchair.  Appears in no acute distress  Cardiovascular:     Rate and Rhythm: Normal rate and regular rhythm.     Heart sounds: Normal heart sounds.  Pulmonary:     Effort: Pulmonary effort is normal.     Breath sounds: Normal breath sounds.  Skin:    General: Skin is warm and dry.  Neurological:     Mental Status: She is alert and oriented to person, place, and time.        Latest Ref Rng & Units 07/07/2023    9:53 AM  CMP  Glucose 70 - 99 mg/dL 981   BUN 8 - 23 mg/dL 16   Creatinine 1.91 - 1.00 mg/dL 4.78   Sodium 295 - 621 mmol/L 135   Potassium 3.5 - 5.1 mmol/L 3.8   Chloride 98 - 111 mmol/L 103   CO2 22 - 32 mmol/L 23   Calcium 8.9 - 10.3 mg/dL 8.5       Latest Ref Rng & Units 09/22/2023   12:51 PM  CBC  WBC 4.0 - 10.5 K/uL 7.5   Hemoglobin 12.0 - 15.0 g/dL 30.8   Hematocrit  36.0 - 46.0 % 39.1   Platelets 150 - 400 K/uL 285      Assessment and plan- Patient is a 83 y.o. female here for routine follow-up of iron deficiency anemia   [name] hemoglobin has improved after 5 doses of IV iron from 7.6 before presently to 12.6.  Ferritin levels are still low at 33 with an iron saturation of 13%.  We will plan to give her 3 doses of IV Venofer at this time.  She has tolerated Venofer well without any significant side  effects   Visit Diagnosis 1. Other iron deficiency anemia      Dr. Owens Shark, MD, MPH [facility] [phone] 09/22/2023 3:06 PM

## 2023-09-23 ENCOUNTER — Encounter: Payer: Self-pay | Admitting: Oncology

## 2023-09-25 LAB — MULTIPLE MYELOMA PANEL, SERUM
Albumin SerPl Elph-Mcnc: 3.8 g/dL (ref 2.9–4.4)
Albumin/Glob SerPl: 1.3 (ref 0.7–1.7)
Alpha 1: 0.2 g/dL (ref 0.0–0.4)
Alpha2 Glob SerPl Elph-Mcnc: 0.6 g/dL (ref 0.4–1.0)
B-Globulin SerPl Elph-Mcnc: 1.3 g/dL (ref 0.7–1.3)
Gamma Glob SerPl Elph-Mcnc: 1 g/dL (ref 0.4–1.8)
Globulin, Total: 3.1 g/dL (ref 2.2–3.9)
IgA: 513 mg/dL — ABNORMAL HIGH (ref 64–422)
IgG (Immunoglobin G), Serum: 1090 mg/dL (ref 586–1602)
IgM (Immunoglobulin M), Srm: 68 mg/dL (ref 26–217)
Total Protein ELP: 6.9 g/dL (ref 6.0–8.5)

## 2023-09-25 LAB — KAPPA/LAMBDA LIGHT CHAINS
Kappa free light chain: 26.3 mg/L — ABNORMAL HIGH (ref 3.3–19.4)
Kappa, lambda light chain ratio: 1.41 (ref 0.26–1.65)
Lambda free light chains: 18.6 mg/L (ref 5.7–26.3)

## 2023-10-03 ENCOUNTER — Telehealth: Payer: Self-pay | Admitting: *Deleted

## 2023-10-03 NOTE — Telephone Encounter (Signed)
 I called the pt. And got her voicemail and I left the hemoglobin was  12.6 and the date of the lab was 09/22/23. Left the message and left my number if she needs anything else.

## 2023-10-05 ENCOUNTER — Ambulatory Visit: Admitting: Clinical

## 2023-10-05 DIAGNOSIS — F419 Anxiety disorder, unspecified: Secondary | ICD-10-CM | POA: Diagnosis not present

## 2023-10-05 DIAGNOSIS — F330 Major depressive disorder, recurrent, mild: Secondary | ICD-10-CM

## 2023-10-05 NOTE — Progress Notes (Signed)
   Stacy Barthel, LCSW

## 2023-10-05 NOTE — Progress Notes (Signed)
 [facility] Counselor/Therapist Progress Note  Patient ID: Stacy Allen, MRN: [id number],    Date: 10/05/2023  Time Spent: 12:33pm - 1:36pm : 63 minutes   Treatment Type: Individual Therapy  Reported Symptoms: anger, crying, lack of sleep, panic attacks, headaches  Mental Status Exam: Appearance:  Neat and Well Groomed     Behavior: Appropriate  Motor: Normal  Speech/Language:  Clear and Coherent and Normal Rate  Affect: Tearful  Mood: angry  Thought process: normal  Thought content:   WNL  Sensory/Perceptual disturbances:   WNL  Orientation: oriented to person, place, situation, and day of week  Attention: Good  Concentration: Good  Memory: WNL  Fund of knowledge:  Good  Insight:   Good  Judgment:  Good  Impulse Control: Good   Risk Assessment: Danger to Self:  No Patient denied current suicidal ideation  Self-injurious Behavior: No Danger to Others: No Patient denied current homicidal ideation Duty to Warn:no Physical Aggression / Violence:No  Access to Firearms a concern: No  Gang Involvement:No   Subjective: Patient stated, "its been real, it's been good, bad, and ugly", "its been really upsetting" in response to events since last session. Patient reported on Monday patient received appointment notifications for additional infusions.  Patient reported she was shocked by the notifications. Patient reported patient's nephew is scheduled to have knee replacement surgery and patient's niece will be with nephew for his surgery. Patient reported concerns related to upcoming infusions, the medical staff being about to locate a vein and transportation to infusions. Patient reported stress related to obtaining internet service and obtaining a new phone. Patient reported she has an appointment with patient's PCP tomorrow and inquired about clonazepam. Patient reported patient's new psychiatrist will not prescribe clonazepam. Patient reported she has been taking  clonazepam for over 40 years and reported concern related to discontinuing the medication. Patient reported crying, lack of sleep, panic attacks, headaches when patient is not taking  clonazepam. Patient stated, "they really don't know what the discussion has done to me" in reference to discussion regarding clonazepam. Patient identified the following positives that have occurred since last session: patient went to [place], went to the dollar tree, mailed several cards to family members, bought some flowers and planted the flowers in hanging baskets, hung up the hanging baskets, patient's pepper plant is growing back, and patient has been walking daily.   Interventions: Cognitive Behavioral Therapy. Clinician conducted session in person at clinician's office at [facility]. Reviewed events since last session and assessed for changes. Provided supportive therapy, active listening as patient discussed patient's concerns related to upcoming infusions and physician discontinuing clonazepam. Provided psycho education related to psychotropic medications. Discussed patient initiating a conversation with patient's physicians to discuss patient's concerns and medication options. Provided local resources for transportation, such as, [employer] and provided patient with resource information. Reviewed patient's homework to identify 1 positive per day.  Clinician requested for homework patient continue to practice challenging thoughts, mindfulness, and identify 1 positive for each day.    Collaboration of Care: not required at this time   Diagnosis:  Mild episode of recurrent major depressive disorder (HCC)   Anxiety disorder, unspecified type     Plan: Patient is to utilize [employer], thought re-framing, mindfulness and coping strategies to decrease symptoms associated with their diagnosis. Frequency: bi-weekly  Modality: individual      Long-term goal:   Patient stated, "I guess I  want to see my being able to recognize and  appreciate what I have instead of focusing on all I've lost".  Target Date: 12/01/23  Progress: progressing    Short-term goal:  Write at least one positive affirmation about herself and/or patient's environment each day Target Date: 12/01/23  Progress: progressing    Practice gratitude exercises to decrease symptoms of depression Target Date: 12/01/23  Progress: progressing    Identify, challenge, and replace negative thought patterns that contribute to feelings of depression and anxiety with positive thoughts and beliefs per patient's report Target Date: 12/01/23  Progress: progressing    Develop and implement strategies to increase self care, such as, participating in water exercise class, accepting invitations to social functions   Target Date: 12/01/23  Progress: progressing    verbalize positive statements regarding self and her ability to cope with life stressors     Target Date: 12/01/23  Progress: progressing    Doree Barthel, LCSW

## 2023-10-06 ENCOUNTER — Ambulatory Visit: Admitting: Primary Care

## 2023-10-06 ENCOUNTER — Encounter: Payer: Self-pay | Admitting: Primary Care

## 2023-10-06 VITALS — BP 132/84 | HR 67 | Temp 96.9°F | Ht 62.0 in | Wt 210.0 lb

## 2023-10-06 DIAGNOSIS — F411 Generalized anxiety disorder: Secondary | ICD-10-CM | POA: Diagnosis not present

## 2023-10-06 DIAGNOSIS — J3089 Other allergic rhinitis: Secondary | ICD-10-CM | POA: Diagnosis not present

## 2023-10-06 DIAGNOSIS — W57XXXA Bitten or stung by nonvenomous insect and other nonvenomous arthropods, initial encounter: Secondary | ICD-10-CM | POA: Insufficient documentation

## 2023-10-06 DIAGNOSIS — Z889 Allergy status to unspecified drugs, medicaments and biological substances status: Secondary | ICD-10-CM | POA: Diagnosis not present

## 2023-10-06 DIAGNOSIS — W57XXXS Bitten or stung by nonvenomous insect and other nonvenomous arthropods, sequela: Secondary | ICD-10-CM

## 2023-10-06 DIAGNOSIS — S50861S Insect bite (nonvenomous) of right forearm, sequela: Secondary | ICD-10-CM

## 2023-10-06 MED ORDER — ESCITALOPRAM OXALATE 10 MG PO TABS: 10.0000 mg | ORAL_TABLET | Freq: Every day | ORAL | 0 refills | Status: DC

## 2023-10-06 MED ORDER — PROPRANOLOL HCL 10 MG PO TABS
10.0000 mg | ORAL_TABLET | Freq: Every day | ORAL | 0 refills | Status: DC | PRN
Start: 2023-10-06 — End: 2023-10-23

## 2023-10-06 MED ORDER — EPINEPHRINE 0.3 MG/0.3ML IJ SOAJ: 0.3000 mg | Freq: Once | INTRAMUSCULAR | 0 refills | Status: AC

## 2023-10-06 NOTE — Patient Instructions (Addendum)
 Start taking escitalopram (Lexapro) 10 mg once daily for anxiety and depression.   You can start taking propranolol as needed for anxiety attacks. Take 1-2 tablets by mouth once daily as needed.  I strongly advise you cut back on your clonazepam.   Follow up with the psychiatrist as scheduled.  It was a pleasure to see you today!

## 2023-10-06 NOTE — Assessment & Plan Note (Signed)
 Improved.  No evidence of cellulitis to either right upper or lower extremity.  She will update if anything changes.

## 2023-10-06 NOTE — Assessment & Plan Note (Addendum)
 Uncontrolled with daily use of clonazepam.   Long discussion today regarding the dangers of daily benzo use and that it was not best practice.  Long discussion today regarding other options, intially she was resistant, now she agrees.  Stop citalopram 20 mg. Start Lexapro 10 mg. Wean off clonazepam. Start propranolol 10-20 mg PRN for anxiety. Discussed instructions. Also recommended she proceed with psychiatry appointment, she will think about this.   Will monitor.

## 2023-10-06 NOTE — Progress Notes (Signed)
 Subjective:    Patient ID: Stacy Allen, female    DOB: December 02, 1940, 83 y.o.   MRN: [id number]  Anxiety Symptoms include nervous/anxious behavior.      Stacy Allen is a very pleasant 83 y.o. female with a history of hypertension, COPD, hypothyroidism, GAD who presents today to discuss several concerns.  She is also needing a refill of her EpiPen.  1) GAD: Chronic. Currently managed on citalopram 20 mg daily, hydroxyzine 10 mg as needed.  Previously managed on clonazepam 1 mg at bedtime for which was discontinued during her prior office visit, instructions were provided for weaning. She was referred to psychiatry.   She presents today frustrated because she would like to continue her clonazepam and the psychiatry office will not prescribe her clonazepam. The hydroxyzine helped with her sleeping but not her anxiety. She has been taking citalopram for decades. She is following with therapy twice monthly.   She was once managed on Lithium and various other medications. She is sad today as her deceased mother's birthday is soon and she cannot get to her [employer] to put some flowers down. She is walking daily, has changed her diet to protein and raw veggies.   2) Insect Bite: She felt an insect fall into her hair on 09/30/23, then landed on her left hand, bit her right 3rd digit and then left medial calf. Her right hand became swollen and red with redness spreading to her right forearm. Her left calf became swollen and red with warmth.   Since then her hand and lower extremity swelling and redness have significantly improvement. She denies fevers, chills.   Wt Readings from Last 3 Encounters:  10/06/23 210 lb (95.3 kg)  09/22/23 214 lb (97.1 kg)  08/09/23 227 lb (103 kg)       Review of Systems  Constitutional:  Negative for fever.  Skin:  Positive for color change and wound.  Psychiatric/Behavioral:  The patient is nervous/anxious.          Past Medical History:   Diagnosis Date   Acute GI bleeding 04/18/2023   Adenomatous polyps    Allergic state    Anemia    Anginal pain (HCC)    Anticoagulated 04/18/2023   Anxiety    Arthritis    osteoarthritis   Benign neoplasm of colon 04/21/2023   Broken rib    left   C. difficile diarrhea 10/21/2021   Cancer (HCC)    skin   CHF (congestive heart failure) (HCC)    Chicken pox    Complication of anesthesia    respiratory distress after rectocele surgery   Coronary artery disease    Depression    Eczema    Fibromyalgia    Gastric polyp 04/19/2023   GERD (gastroesophageal reflux disease)    GI bleed 04/18/2023   Headache    migraines   Hemorrhoids    History of hiatal hernia    Hypercalcemia    Hyperlipidemia    Hypertension    Hypothyroidism    Lumbar spondylosis 06/01/2017   Lumbar stenosis    Melena 04/19/2023   Obesity    Peptic ulcer disease    Shortness of breath dyspnea    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   Sleep apnea    No CPAP   Symptomatic anemia 04/18/2023   Vertigo    Vitamin D deficiency     Social History   Socioeconomic History   Marital status: Widowed    Spouse name: Not  on file   Number of children: Not on file   Years of education: Not on file   Highest education level: Not on file  Occupational History   Occupation: Retired  Tobacco Use   Smoking status: Former    Current packs/day: 0.00    Types: Cigarettes    Quit date: 2000    Years since quitting: 25.2    Passive exposure: Past   Smokeless tobacco: Never  Vaping Use   Vaping status: Never Used  Substance and Sexual Activity   Alcohol use: No   Drug use: No   Sexual activity: Not Currently  Other Topics Concern   Not on file  Social History Narrative   Lives in camper in nephew's backyard. No pets.   Social Drivers of Corporate investment banker Strain: Low Risk  (06/02/2023)   Received from [facility]   Overall Financial Resource Strain (CARDIA)    Difficulty  of Paying Living Expenses: Not hard at all  Food Insecurity: No Food Insecurity (07/26/2023)   Hunger Vital Sign    Worried About Running Out of Food in the Last Year: Never true    Ran Out of Food in the Last Year: Never true  Transportation Needs: No Transportation Needs (07/26/2023)   PRAPARE - Administrator, Civil Service (Medical): No    Lack of Transportation (Non-Medical): No  Physical Activity: Not on file  Stress: Not on file  Social Connections: Not on file  Intimate Partner Violence: Not At Risk (07/26/2023)   Humiliation, Afraid, Rape, and Kick questionnaire    Fear of Current or Ex-Partner: No    Emotionally Abused: No    Physically Abused: No    Sexually Abused: No    Past Surgical History:  Procedure Laterality Date   ABDOMINAL HYSTERECTOMY     BACK SURGERY  2007   Dr. Gerrit Heck, [facility], Spinal Fusion   CARDIAC CATHETERIZATION N/A 04/03/2015   Procedure: Left Heart Cath and Coronary Angiography;  Surgeon: Dalia Heading, MD;  Location: [facility] INVASIVE CV LAB;  Service: Cardiovascular;  Laterality: N/A;   CATARACT EXTRACTION W/PHACO Left 04/22/2019   Procedure: CATARACT EXTRACTION PHACO AND INTRAOCULAR LENS PLACEMENT (IOC) LEFT  00:45.4  12.9%  6.03;  Surgeon: Nevada Crane, MD;  Location: [facility];  Service: Ophthalmology;  Laterality: Left;  Latex sleep apnea   CATARACT EXTRACTION W/PHACO Right 07/01/2019   Procedure: CATARACT EXTRACTION PHACO AND INTRAOCULAR LENS PLACEMENT (IOC) RIGHT 2.38  00:24.4;  Surgeon: Nevada Crane, MD;  Location: [facility];  Service: Ophthalmology;  Laterality: Right;  Latex Sleep apnea   CHOLECYSTECTOMY     COLONOSCOPY WITH PROPOFOL N/A 04/21/2023   Procedure: COLONOSCOPY WITH PROPOFOL;  Surgeon: Benancio Deeds, MD;  Location: [facility] ENDOSCOPY;  Service: Gastroenterology;  Laterality: N/A;   CORONARY ANGIOPLASTY  2015   Dr. Lady Gary, [facility] Cath Lab   ESOPHAGOGASTRODUODENOSCOPY (EGD) WITH PROPOFOL N/A  12/21/2016   Procedure: ESOPHAGOGASTRODUODENOSCOPY (EGD) WITH PROPOFOL;  Surgeon: Scot Jun, MD;  Location: [facility] ENDOSCOPY;  Service: Endoscopy;  Laterality: N/A;   ESOPHAGOGASTRODUODENOSCOPY (EGD) WITH PROPOFOL N/A 12/08/2022   Procedure: ESOPHAGOGASTRODUODENOSCOPY (EGD) WITH PROPOFOL;  Surgeon: Jaynie Collins, DO;  Location: [facility] ENDOSCOPY;  Service: Gastroenterology;  Laterality: N/A;   ESOPHAGOGASTRODUODENOSCOPY (EGD) WITH PROPOFOL N/A 04/19/2023   Procedure: ESOPHAGOGASTRODUODENOSCOPY (EGD) WITH PROPOFOL;  Surgeon: Benancio Deeds, MD;  Location: [facility] ENDOSCOPY;  Service: Gastroenterology;  Laterality: N/A;   EYE SURGERY  FRACTURE SURGERY Left 1991   Fractures Femur, [facility]   GIVENS CAPSULE STUDY N/A 06/22/2023   Procedure: GIVENS CAPSULE STUDY;  Surgeon: Midge Minium, MD;  Location: [facility] ENDOSCOPY;  Service: Endoscopy;  Laterality: N/A;   HEMOSTASIS CLIP PLACEMENT  04/19/2023   Procedure: HEMOSTASIS CLIP PLACEMENT;  Surgeon: Benancio Deeds, MD;  Location: [facility] ENDOSCOPY;  Service: Gastroenterology;;   HIP ARTHROPLASTY Right 05/07/2020   Procedure: ARTHROPLASTY RIGHT HIP (HEMIARTHROPLASTY);  Surgeon: Juanell Fairly, MD;  Location: [facility] ORS;  Service: Orthopedics;  Laterality: Right;   IMAGE GUIDED SINUS SURGERY Bilateral 09/28/2015   Procedure: IMAGE GUIDED SINUS SURGERY, SEPTOPLASTY, BILATERAL FRONTAL SINUSOTOMIES, BILATERAL MAXILLARY ANTROSTOMIES, BILATERAL TOTAL ETHMOIDECTOMY, BILATERAL SPHENOIDECTOMY, BILATERAL INFERIOR TURBINATE REDUCTION;  Surgeon: Vernie Murders, MD;  Location: [facility] ORS;  Service: ENT;  Laterality: Bilateral;   LEFT HEART CATH AND CORONARY ANGIOGRAPHY Left 01/19/2023   Procedure: LEFT HEART CATH AND CORONARY ANGIOGRAPHY;  Surgeon: Marcina Millard, MD;  Location: [facility] INVASIVE CV LAB;  Service: Cardiovascular;  Laterality: Left;   POLYPECTOMY  04/19/2023   Procedure: POLYPECTOMY;  Surgeon: Benancio Deeds, MD;  Location: [facility]  ENDOSCOPY;  Service: Gastroenterology;;   POLYPECTOMY  04/21/2023   Procedure: POLYPECTOMY;  Surgeon: Benancio Deeds, MD;  Location: [facility] ENDOSCOPY;  Service: Gastroenterology;;   RECTOCELE REPAIR      Family History  Problem Relation Age of Onset   Hypertension Mother    Cancer Mother    Diabetes Father    Hypertension Father    Heart failure Father    Prostate cancer Brother    Breast cancer Neg Hx     Allergies  Allergen Reactions   Azithromycin Diarrhea and Rash    "fainting"  Other reaction(s): Syncope  "fainting"    Other reaction(s): NOT KNOWN  "fainting"   Meloxicam Diarrhea   Morphine Other (See Comments) and Rash    Other reaction(s): Unknown Retain urine Other reaction(s): NOT KNOWN, Other (See Comments) Retain urine Other reaction(s): side effect of severe rash   Adhesive [Tape]     blisters   Codeine Nausea And Vomiting   Lactose Intolerance (Gi) Other (See Comments)    "stomach pain"   Red Dye #40 (Allura Red) Itching    "red food dye"   Shellfish-Derived Products Itching and Swelling   Aspirin Rash   Latex Rash    With extended contact   Lodine [Etodolac] Rash   Morphine And Codeine Hives and Nausea And Vomiting    Other reaction(s): UNKNOWN   Penicillins Rash and Hives    Given on 05/07/20.  No reaction noted.   Pravastatin Rash    Current Outpatient Medications on File Prior to Visit  Medication Sig Dispense Refill   acetaminophen (TYLENOL) 500 MG tablet Take 500 mg by mouth every 6 (six) hours as needed.     albuterol (PROVENTIL HFA;VENTOLIN HFA) 108 (90 BASE) MCG/ACT inhaler Inhale 2 puffs into the lungs every 6 (six) hours as needed for wheezing or shortness of breath.     carboxymethylcellulose (REFRESH PLUS) 0.5 % SOLN Place 1 drop into both eyes daily as needed (dry eyes).     clonazePAM (KLONOPIN) 1 MG tablet Take 1 mg by mouth at bedtime.     fluticasone (FLONASE) 50 MCG/ACT nasal spray Place into both nostrils.      Fluticasone-Salmeterol (ADVAIR HFA IN) Inhale 1 puff into the lungs daily as needed.     gabapentin (NEURONTIN) 600 MG tablet Take 600-1,200 mg by mouth See admin instructions. Take 1  tablet (600mg ) by mouth in the morning and 2 tablets (1200mg ) by mouth at bedtime     hydrOXYzine (ATARAX) 10 MG tablet Take 1-2 tablets (10-20 mg total) by mouth at bedtime as needed for anxiety. 90 tablet 0   levothyroxine (SYNTHROID) 125 MCG tablet Take 1 tablet by mouth every morning on an empty stomach with water only.  No food or other medications for 30 minutes. 90 tablet 2   omeprazole (PRILOSEC) 40 MG capsule Take 1 capsule (40 mg total) by mouth 2 (two) times daily before a meal. 60 capsule 1   tiZANidine (ZANAFLEX) 4 MG tablet Take 1 tablet (4 mg total) by mouth every 8 (eight) hours as needed for muscle spasms. 30 tablet 0   trimethoprim (TRIMPEX) 100 MG tablet Take 1 tablet (100 mg total) by mouth daily. 30 tablet 11   [DISCONTINUED] loratadine (CLARITIN) 10 MG tablet Take 1 tablet (10 mg total) by mouth daily. Take 1 tablet in the morning. As needed for itching. 30 tablet 0   No current facility-administered medications on file prior to visit.    BP 132/84   Pulse 67   Temp (!) 96.9 F (36.1 C) (Temporal)   Ht 5\' 2"  (1.575 m)   Wt 210 lb (95.3 kg)   SpO2 97%   BMI 38.41 kg/m  Objective:   Physical Exam Cardiovascular:     Rate and Rhythm: Normal rate and regular rhythm.  Pulmonary:     Effort: Pulmonary effort is normal.     Breath sounds: Normal breath sounds.  Musculoskeletal:     Cervical back: Neck supple.  Skin:    General: Skin is warm and dry.     Comments: No erythema to right lower extremity and right upper extremity.   Neurological:     Mental Status: She is alert and oriented to person, place, and time.  Psychiatric:     Comments: Tearful during most of her visit           Assessment & Plan:  GAD (generalized anxiety disorder) Assessment & Plan: Uncontrolled with  daily use of clonazepam.   Long discussion today regarding the dangers of daily benzo use and that it was not best practice.  Long discussion today regarding other options, intially she was resistant, now she agrees.  Stop citalopram 20 mg. Start Lexapro 10 mg. Wean off clonazepam. Start propranolol 10-20 mg PRN for anxiety. Discussed instructions. Also recommended she proceed with psychiatry appointment, she will think about this.   Will monitor.  Orders: -     Escitalopram Oxalate; Take 1 tablet (10 mg total) by mouth daily. for anxiety and depression.  Dispense: 90 tablet; Refill: 0 -     Propranolol HCl; Take 1-2 tablets (10-20 mg total) by mouth daily as needed. For anxiety  Dispense: 90 tablet; Refill: 0  Insect bite of right forearm, sequela Assessment & Plan: Improved.  No evidence of cellulitis to either right upper or lower extremity.  She will update if anything changes.    Multiple drug allergies -     EPINEPHrine; Inject 0.3 mg into the muscle once for 1 dose.  Dispense: 0.3 mL; Refill: 0  Environmental and seasonal allergies -     EPINEPHrine; Inject 0.3 mg into the muscle once for 1 dose.  Dispense: 0.3 mL; Refill: 0        Doreene Nest, NP

## 2023-10-09 ENCOUNTER — Inpatient Hospital Stay: Attending: Oncology

## 2023-10-09 VITALS — BP 181/97 | HR 68 | Temp 99.0°F | Resp 17

## 2023-10-09 DIAGNOSIS — D509 Iron deficiency anemia, unspecified: Secondary | ICD-10-CM | POA: Diagnosis present

## 2023-10-09 DIAGNOSIS — D508 Other iron deficiency anemias: Secondary | ICD-10-CM

## 2023-10-09 MED ORDER — SODIUM CHLORIDE 0.9% FLUSH
10.0000 mL | Freq: Once | INTRAVENOUS | Status: AC | PRN
  Administered 2023-10-09: 10 mL
  Filled 2023-10-09: qty 10

## 2023-10-09 MED ORDER — IRON SUCROSE 20 MG/ML IV SOLN
200.0000 mg | INTRAVENOUS | Status: DC
  Administered 2023-10-09: 200 mg via INTRAVENOUS
  Filled 2023-10-09: qty 10

## 2023-10-09 NOTE — Patient Instructions (Signed)

## 2023-10-10 ENCOUNTER — Ambulatory Visit: Payer: Self-pay | Admitting: Primary Care

## 2023-10-10 ENCOUNTER — Encounter: Payer: Self-pay | Admitting: Family

## 2023-10-10 ENCOUNTER — Ambulatory Visit: Admitting: Family

## 2023-10-10 VITALS — BP 138/84 | HR 85 | Temp 98.6°F | Ht 62.0 in | Wt 211.8 lb

## 2023-10-10 DIAGNOSIS — I10 Essential (primary) hypertension: Secondary | ICD-10-CM

## 2023-10-10 DIAGNOSIS — E039 Hypothyroidism, unspecified: Secondary | ICD-10-CM

## 2023-10-10 MED ORDER — AMLODIPINE BESYLATE 2.5 MG PO TABS: 2.5000 mg | ORAL_TABLET | Freq: Every day | ORAL | 0 refills | Status: DC

## 2023-10-10 NOTE — Assessment & Plan Note (Signed)
 Worsening Restart amlodipine 2.5 mg once daily.  Monitor blood pressure at home daily, report back for f/u with PCP in two weeks or sooner if still elevated with symptoms.  Red flag precautions discussed.

## 2023-10-10 NOTE — Telephone Encounter (Signed)
 Copied from CRM [id number]. Topic: Clinical - Red Word Triage >> Oct 10, 2023  9:01 AM [name] Macho wrote: Reason for CRM: patient has been having high blood pressure and headaches lately. Patient stated her medication has changed from clonazepam to lexapro. When she went to the dentist on 4/8 her blood pressure was high but it was normal when they checked it again. Patient also stated that yesterday when she went to the cancer center to get blood infusions her blood pressure was 170/100   Chief Complaint: High blood pressure Symptoms: Headache Frequency: consnat Pertinent Negatives: Patient denies chest pain Disposition: [] ED /[] Urgent Care (no appt availability in office) / [x] Appointment(In office/virtual)/ []  [place] [facility]/ [] Home Care/ [] Refused Recommended Disposition /[] [place] [facility]/ []  Follow-up with PCP Additional Notes: BP elevated at dentist appt last week and infusion center yesterday. Pt also reports headaches. States that she was taken off BP meds before so she is not currently taking anything. Appt scheduled for today at 12:20p  Reason for Disposition  Systolic BP  >= 180 OR Diastolic >= 110  Answer Assessment - Initial Assessment Questions 1. BLOOD PRESSURE: "What is the blood pressure?" "Did you take at least two measurements 5 minutes apart?"     181/97   2. ONSET: "When did you take your blood pressure?"     Taken yesterday at infusion center  3. HOW: "How did you take your blood pressure?" (e.g., automatic home BP monitor, visiting nurse)     Taken at doctor office  4. HISTORY: "Do you have a history of high blood pressure?"     Yes  5. MEDICINES: "Are you taking any medicines for blood pressure?" "Have you missed any doses recently?"    Not currently on BP meds, was taking off  6. OTHER SYMPTOMS: "Do you have any symptoms?" (e.g., blurred vision, chest pain, difficulty breathing, headache, weakness)     Headache  7. PREGNANCY: "Is there any chance  you are pregnant?" "When was your last menstrual period?"     No  Protocols used: Blood Pressure - High-A-AH

## 2023-10-10 NOTE — Telephone Encounter (Signed)
Agree with precautions given to pt  Agree with nurse assessment in plan.  Thank you for speaking with them.

## 2023-10-10 NOTE — Assessment & Plan Note (Signed)
 stable

## 2023-10-10 NOTE — Progress Notes (Signed)
 Established Patient Office Visit  Subjective:   Patient ID: Stacy Allen, female    DOB: March 22, 1941  Age: 83 y.o. MRN: [id number]  CC:  Chief Complaint  Patient presents with   Headache    Mostly in the morning but did not have one this morning   Hypertension    Yesterday she had an iron infusion 180/?    HPI: Stacy Allen is a 83 y.o. female presenting on 10/10/2023 for Headache (Mostly in the morning but did not have one this morning) and Hypertension (Yesterday she had an iron infusion 180/?)  HTN:  Concerned because yesterday while at  hematology her blood pressure was elevated 170/100. She has been having headaches as well which started the same day that she started to switch her medications . She was on amlodipine in the past. She can't find her blood pressure cuff.   She did recently see her PCP 4/11 she stopped celexa and started lexapro and are working on weaning down from clonazepam. Was also given propanolol 10-20 mg prn for anxiety. She only needed to take clonazepam yesterday 1/2 tablet after having a stressful situation.   Tsh two weeks ago normal limits at 1.8     ROS: Negative unless specifically indicated above in HPI.   Relevant past medical history reviewed and updated as indicated.   Allergies and medications reviewed and updated.   Current Outpatient Medications:    acetaminophen (TYLENOL) 500 MG tablet, Take 500 mg by mouth every 6 (six) hours as needed., Disp: , Rfl:    albuterol (PROVENTIL HFA;VENTOLIN HFA) 108 (90 BASE) MCG/ACT inhaler, Inhale 2 puffs into the lungs every 6 (six) hours as needed for wheezing or shortness of breath., Disp: , Rfl:    amLODipine (NORVASC) 2.5 MG tablet, Take 1 tablet (2.5 mg total) by mouth daily., Disp: 30 tablet, Rfl: 0   carboxymethylcellulose (REFRESH PLUS) 0.5 % SOLN, Place 1 drop into both eyes daily as needed (dry eyes)., Disp: , Rfl:    clonazePAM (KLONOPIN) 1 MG tablet, Take 1 mg by mouth at  bedtime., Disp: , Rfl:    escitalopram (LEXAPRO) 10 MG tablet, Take 1 tablet (10 mg total) by mouth daily. for anxiety and depression., Disp: 90 tablet, Rfl: 0   fluticasone (FLONASE) 50 MCG/ACT nasal spray, Place into both nostrils., Disp: , Rfl:    Fluticasone-Salmeterol (ADVAIR HFA IN), Inhale 1 puff into the lungs daily as needed., Disp: , Rfl:    gabapentin (NEURONTIN) 600 MG tablet, Take 600-1,200 mg by mouth See admin instructions. Take 1 tablet (600mg ) by mouth in the morning and 2 tablets (1200mg ) by mouth at bedtime, Disp: , Rfl:    hydrOXYzine (ATARAX) 10 MG tablet, Take 1-2 tablets (10-20 mg total) by mouth at bedtime as needed for anxiety., Disp: 90 tablet, Rfl: 0   levothyroxine (SYNTHROID) 125 MCG tablet, Take 1 tablet by mouth every morning on an empty stomach with water only.  No food or other medications for 30 minutes., Disp: 90 tablet, Rfl: 2   omeprazole (PRILOSEC) 40 MG capsule, Take 1 capsule (40 mg total) by mouth 2 (two) times daily before a meal., Disp: 60 capsule, Rfl: 1   propranolol (INDERAL) 10 MG tablet, Take 1-2 tablets (10-20 mg total) by mouth daily as needed. For anxiety, Disp: 90 tablet, Rfl: 0   tiZANidine (ZANAFLEX) 4 MG tablet, Take 1 tablet (4 mg total) by mouth every 8 (eight) hours as needed for muscle spasms., Disp: 30 tablet,  Rfl: 0   trimethoprim (TRIMPEX) 100 MG tablet, Take 1 tablet (100 mg total) by mouth daily., Disp: 30 tablet, Rfl: 11  Allergies  Allergen Reactions   Azithromycin Diarrhea and Rash    "fainting"  Other reaction(s): Syncope  "fainting"    Other reaction(s): NOT KNOWN  "fainting"   Meloxicam Diarrhea   Morphine Other (See Comments) and Rash    Other reaction(s): Unknown Retain urine Other reaction(s): NOT KNOWN, Other (See Comments) Retain urine Other reaction(s): side effect of severe rash   Adhesive [Tape]     blisters   Codeine Nausea And Vomiting   Lactose Intolerance (Gi) Other (See Comments)    "stomach pain"    Red Dye #40 (Allura Red) Itching    "red food dye"   Shellfish-Derived Products Itching and Swelling   Aspirin Rash   Latex Rash    With extended contact   Lodine [Etodolac] Rash   Morphine And Codeine Hives and Nausea And Vomiting    Other reaction(s): UNKNOWN   Penicillins Rash and Hives    Given on 05/07/20.  No reaction noted.   Pravastatin Rash    Objective:   BP 138/84   Pulse 85   Temp 98.6 F (37 C) (Oral)   Ht 5\' 2"  (1.575 m)   Wt 211 lb 12.8 oz (96.1 kg)   SpO2 96%   BMI 38.74 kg/m    Physical Exam Vitals reviewed.  Constitutional:      General: She is not in acute distress.    Appearance: Normal appearance. She is normal weight. She is not ill-appearing, toxic-appearing or diaphoretic.  HENT:     Head: Normocephalic.  Cardiovascular:     Rate and Rhythm: Normal rate and regular rhythm.  Pulmonary:     Effort: Pulmonary effort is normal.  Musculoskeletal:        General: Normal range of motion.  Neurological:     General: No focal deficit present.     Mental Status: She is alert and oriented to person, place, and time. Mental status is at baseline.  Psychiatric:        Mood and Affect: Mood normal.        Behavior: Behavior normal.        Thought Content: Thought content normal.        Judgment: Judgment normal.    Wt Readings from Last 3 Encounters:  10/10/23 211 lb 12.8 oz (96.1 kg)  10/06/23 210 lb (95.3 kg)  09/22/23 214 lb (97.1 kg)      Assessment & Plan:  Essential hypertension Assessment & Plan: Worsening Restart amlodipine 2.5 mg once daily.  Monitor blood pressure at home daily, report back for f/u with PCP in two weeks or sooner if still elevated with symptoms.  Red flag precautions discussed.    Orders: -     amLODIPine Besylate; Take 1 tablet (2.5 mg total) by mouth daily.  Dispense: 30 tablet; Refill: 0  Hypothyroidism, unspecified type Assessment & Plan: stable      Follow up plan: Return in about 2 weeks (around  10/24/2023) for f/u blood pressure with PCP Ms. Clark .  Felicita Horns, FNP

## 2023-10-13 ENCOUNTER — Inpatient Hospital Stay

## 2023-10-13 VITALS — BP 144/91 | HR 72 | Temp 97.8°F

## 2023-10-13 DIAGNOSIS — D508 Other iron deficiency anemias: Secondary | ICD-10-CM

## 2023-10-13 DIAGNOSIS — D509 Iron deficiency anemia, unspecified: Secondary | ICD-10-CM | POA: Diagnosis not present

## 2023-10-13 MED ORDER — SODIUM CHLORIDE 0.9% FLUSH
10.0000 mL | Freq: Once | INTRAVENOUS | Status: AC | PRN
  Administered 2023-10-13: 10 mL
  Filled 2023-10-13: qty 10

## 2023-10-13 MED ORDER — IRON SUCROSE 20 MG/ML IV SOLN
200.0000 mg | INTRAVENOUS | Status: DC
  Administered 2023-10-13: 200 mg via INTRAVENOUS

## 2023-10-16 ENCOUNTER — Inpatient Hospital Stay

## 2023-10-16 VITALS — BP 171/87 | HR 60 | Temp 95.5°F | Resp 16

## 2023-10-16 DIAGNOSIS — D508 Other iron deficiency anemias: Secondary | ICD-10-CM

## 2023-10-16 DIAGNOSIS — D509 Iron deficiency anemia, unspecified: Secondary | ICD-10-CM | POA: Diagnosis not present

## 2023-10-16 MED ORDER — IRON SUCROSE 20 MG/ML IV SOLN
200.0000 mg | INTRAVENOUS | Status: DC
  Administered 2023-10-16: 200 mg via INTRAVENOUS
  Filled 2023-10-16: qty 10

## 2023-10-17 ENCOUNTER — Telehealth: Payer: Self-pay | Admitting: Primary Care

## 2023-10-17 NOTE — Telephone Encounter (Signed)
 Copied from CRM [id number]. Topic: Clinical - Medication Question >> Oct 17, 2023 12:11 PM Allyne Areola wrote: Reason for CRM: Kathaleen Pale from [facility] is calling for clarification on a prescription they received for epinephrine  auto-injector. Best call back number [phone]. Order number [id number].

## 2023-10-18 ENCOUNTER — Ambulatory Visit: Payer: Self-pay

## 2023-10-18 NOTE — Telephone Encounter (Signed)
 Chief Complaint: Sinus Pain Symptoms: Sinus pain above the eyes, dry cough, post nasal drip, green sputum, sore throat  Frequency: Constant x 1 week and half Pertinent Negatives: Patient denies fever, nausea, vomiting,  Disposition: [] ED /[] Urgent Care (no appt availability in office) / [x] Appointment(In office/virtual)/ []  [facility]/ [] Home Care/ [] Refused Recommended Disposition /[] [facility]/ []  Follow-up with PCP Additional Notes: Patient reports having sinus pain and pressure in the face above the eyes. Patient also reports coughing, post nasal drip, green sputum. Care advice given and patient has been scheduled for an appointment tomorrow morning.   Copied from CRM [id number]. Topic: Clinical - Red Word Triage >> Oct 18, 2023  9:27 AM Alethia Huxley E wrote: [facility] that prompted transfer to Nurse Triage: Sinus infection symptoms. Patient stated she is having right ear pain, with drainage into her throat, producing phlegm. Patient also experiencing elevated BP (did not have exact reading), and feels nauseous. Symptoms have been going on for the past week and a half. Reason for Disposition  Earache  Answer Assessment - Initial Assessment Questions 1. LOCATION: "Where does it hurt?"      Over my eyes  2. ONSET: "When did the sinus pain start?"  (e.g., hours, days)      About a week and half ago  3. SEVERITY: "How bad is the pain?"   (Scale 1-10; mild, moderate or severe)   - MILD (1-3): doesn't interfere with normal activities    - MODERATE (4-7): interferes with normal activities (e.g., work or school) or awakens from sleep   - SEVERE (8-10): excruciating pain and patient unable to do any normal activities        Moderate  4. RECURRENT SYMPTOM: "Have you ever had sinus problems before?" If Yes, ask: "When was the last time?" and "What happened that time?"      Yes, I needed antibiotics  5. NASAL CONGESTION: "Is the nose blocked?" If Yes, ask: "Can you open it or must  you breathe through your mouth?"     Yes  6. NASAL DISCHARGE: "Do you have discharge from your nose?" If so ask, "What color?"     Yes, green  7. FEVER: "Do you have a fever?" If Yes, ask: "What is it, how was it measured, and when did it start?"      No 8. OTHER SYMPTOMS: "Do you have any other symptoms?" (e.g., sore throat, cough, earache, difficulty breathing)     Headache, earache, sore throat  9. PREGNANCY: "Is there any chance you are pregnant?" "When was your last menstrual period?"     No  Protocols used: Sinus Pain or Congestion-A-AH

## 2023-10-18 NOTE — Telephone Encounter (Signed)
 Noted and okay to proceed with 2 pack.

## 2023-10-18 NOTE — Telephone Encounter (Signed)
  Per chart review Polly Brink reordered Epipen  on 10/06/23, unsure why it is showing expired.  Called and spoke with [facility], they stated there Epipens only come in a pack of two and needed a verbal okay to proceed with order. Nothing further needed at this time.

## 2023-10-18 NOTE — Telephone Encounter (Signed)
 Pharmacy notified.

## 2023-10-18 NOTE — Telephone Encounter (Signed)
 NOTED

## 2023-10-18 NOTE — Telephone Encounter (Signed)
 Can we find out the question? Let them know that we do not have an EpiPen  on file within her chart.  Who was the last prescriber?

## 2023-10-19 ENCOUNTER — Ambulatory Visit: Admitting: Family

## 2023-10-19 ENCOUNTER — Encounter: Payer: Self-pay | Admitting: Family

## 2023-10-19 VITALS — BP 156/98 | HR 84 | Temp 98.3°F | Ht 62.0 in | Wt 206.6 lb

## 2023-10-19 DIAGNOSIS — I10 Essential (primary) hypertension: Secondary | ICD-10-CM

## 2023-10-19 DIAGNOSIS — J0110 Acute frontal sinusitis, unspecified: Secondary | ICD-10-CM | POA: Insufficient documentation

## 2023-10-19 MED ORDER — AMLODIPINE BESYLATE 5 MG PO TABS: 5.0000 mg | ORAL_TABLET | Freq: Every day | ORAL | Status: DC

## 2023-10-19 MED ORDER — DOXYCYCLINE HYCLATE 100 MG PO TABS: 100.0000 mg | ORAL_TABLET | Freq: Two times a day (BID) | ORAL | 0 refills | Status: DC

## 2023-10-19 NOTE — Patient Instructions (Signed)
Increase amlodipine to 5 mg once daily. 

## 2023-10-19 NOTE — Assessment & Plan Note (Addendum)
 RX doxycycline  100 mg po bid x 10 days Pt to continue tylenol /ibuprofen prn sinus pain. Continue with humidifier prn and steam showers recommended as well. instructed If no symptom improvement in 48 hours please f/u Restart flonase   Start claritin 

## 2023-10-19 NOTE — Progress Notes (Signed)
 Established Patient Office Visit  Subjective:      CC:  Chief Complaint  Patient presents with   Acute Visit    HPI: Stacy Allen is a 83 y.o. female presenting on 10/19/2023 for Acute Visit . HTN: still seeing elevated blood pressures, yesterday was 169/92. Today in office 156/98. She did restart her amlodipine  at 2.5 mg once daily, however she did self increase to 5 mg once daily this am.   Having a lot of PND at night time this Is worse for her. She does report sinus pressure in the face as well especially around the frontal part of the head. She did try benadryl  without relief but most antihistamines she notices cause more mouth dryness. She does not have nasal congestion or a cough. She does notice some ringing in her ears and some sore throat.         Social history:  Relevant past medical, surgical, family and social history reviewed and updated as indicated. Interim medical history since our last visit reviewed.  Allergies and medications reviewed and updated.  DATA REVIEWED: CHART IN EPIC     ROS: Negative unless specifically indicated above in HPI.    Current Outpatient Medications:    acetaminophen  (TYLENOL ) 500 MG tablet, Take 500 mg by mouth every 6 (six) hours as needed., Disp: , Rfl:    albuterol  (PROVENTIL  HFA;VENTOLIN  HFA) 108 (90 BASE) MCG/ACT inhaler, Inhale 2 puffs into the lungs every 6 (six) hours as needed for wheezing or shortness of breath., Disp: , Rfl:    amLODipine  (NORVASC ) 5 MG tablet, Take 1 tablet (5 mg total) by mouth daily., Disp: , Rfl:    carboxymethylcellulose (REFRESH PLUS) 0.5 % SOLN, Place 1 drop into both eyes daily as needed (dry eyes)., Disp: , Rfl:    clonazePAM  (KLONOPIN ) 1 MG tablet, Take 1 mg by mouth at bedtime., Disp: , Rfl:    escitalopram  (LEXAPRO ) 10 MG tablet, Take 1 tablet (10 mg total) by mouth daily. for anxiety and depression., Disp: 90 tablet, Rfl: 0   fluticasone  (FLONASE ) 50 MCG/ACT nasal spray, Place into  both nostrils., Disp: , Rfl:    Fluticasone -Salmeterol (ADVAIR HFA IN), Inhale 1 puff into the lungs daily as needed., Disp: , Rfl:    gabapentin  (NEURONTIN ) 600 MG tablet, Take 600-1,200 mg by mouth See admin instructions. Take 1 tablet (600mg ) by mouth in the morning and 2 tablets (1200mg ) by mouth at bedtime, Disp: , Rfl:    hydrOXYzine  (ATARAX ) 10 MG tablet, Take 1-2 tablets (10-20 mg total) by mouth at bedtime as needed for anxiety., Disp: 90 tablet, Rfl: 0   levothyroxine  (SYNTHROID ) 125 MCG tablet, Take 1 tablet by mouth every morning on an empty stomach with water only.  No food or other medications for 30 minutes., Disp: 90 tablet, Rfl: 2   omeprazole  (PRILOSEC) 40 MG capsule, Take 1 capsule (40 mg total) by mouth 2 (two) times daily before a meal., Disp: 60 capsule, Rfl: 1   propranolol  (INDERAL ) 10 MG tablet, Take 1-2 tablets (10-20 mg total) by mouth daily as needed. For anxiety, Disp: 90 tablet, Rfl: 0   tiZANidine  (ZANAFLEX ) 4 MG tablet, Take 1 tablet (4 mg total) by mouth every 8 (eight) hours as needed for muscle spasms., Disp: 30 tablet, Rfl: 0   trimethoprim  (TRIMPEX ) 100 MG tablet, Take 1 tablet (100 mg total) by mouth daily., Disp: 30 tablet, Rfl: 11      Objective:    BP (!) 156/98 (BP  Location: Right Arm, Patient Position: Sitting, Cuff Size: Large)   Pulse 84   Temp 98.3 F (36.8 C) (Temporal)   Ht 5\' 2"  (1.575 m)   Wt 206 lb 9.6 oz (93.7 kg)   SpO2 98%   BMI 37.79 kg/m   Wt Readings from Last 3 Encounters:  10/19/23 206 lb 9.6 oz (93.7 kg)  10/10/23 211 lb 12.8 oz (96.1 kg)  10/06/23 210 lb (95.3 kg)    Physical Exam Vitals reviewed.  Constitutional:      General: She is not in acute distress.    Appearance: Normal appearance. She is normal weight. She is not ill-appearing, toxic-appearing or diaphoretic.  HENT:     Head: Normocephalic.     Right Ear: Tympanic membrane normal.     Left Ear: Tympanic membrane normal.     Nose:     Right Sinus: Frontal  sinus tenderness present.     Left Sinus: Frontal sinus tenderness present.     Mouth/Throat:     Mouth: Mucous membranes are moist.     Pharynx: Posterior oropharyngeal erythema and postnasal drip present. No oropharyngeal exudate.  Eyes:     Extraocular Movements: Extraocular movements intact.     Pupils: Pupils are equal, round, and reactive to light.  Cardiovascular:     Rate and Rhythm: Normal rate and regular rhythm.     Pulses: Normal pulses.     Heart sounds: Normal heart sounds.  Pulmonary:     Effort: Pulmonary effort is normal.     Breath sounds: Normal breath sounds.  Abdominal:     General: Abdomen is flat. Bowel sounds are normal.     Palpations: Abdomen is soft.     Tenderness: There is no guarding or rebound.  Musculoskeletal:        General: Normal range of motion.     Cervical back: Normal range of motion.  Skin:    General: Skin is warm.     Capillary Refill: Capillary refill takes less than 2 seconds.  Neurological:     General: No focal deficit present.     Mental Status: She is alert and oriented to person, place, and time. Mental status is at baseline.  Psychiatric:        Mood and Affect: Mood normal.        Behavior: Behavior normal.        Thought Content: Thought content normal.        Judgment: Judgment normal.           Assessment & Plan:  Essential hypertension -     amLODIPine  Besylate; Take 1 tablet (5 mg total) by mouth daily.  Acute non-recurrent frontal sinusitis Assessment & Plan: RX doxycycline  100 mg po bid x 10 days Pt to continue tylenol /ibuprofen prn sinus pain. Continue with humidifier prn and steam showers recommended as well. instructed If no symptom improvement in 48 hours please f/u Restart flonase   Start claritin          Return in about 2 weeks (around 11/02/2023) for schedule two weeks f/u with PCP .  Felicita Horns, MSN, APRN, FNP-C [facility]

## 2023-10-20 NOTE — Progress Notes (Signed)
 Psychiatric Initial Adult Assessment   Patient Identification: PHUONG HILLARY MRN:  [id number] Date of Evaluation:  10/23/2023 Referral Source: [name], NP  Chief Complaint:   Chief Complaint  Patient presents with   Establish Care   Visit Diagnosis:    ICD-10-CM   1. Mild episode of recurrent major depressive disorder (HCC)  F33.0     2. GAD (generalized anxiety disorder)  F41.1       History of Present Illness:   JAQUIA BENEDICTO is a 83 y.o. year old female with a history of anxiety, CHF, CAD s/p PCI, DVT, hypertension, hypothyroidism, iron  deficiency anemia, COPD, who is referred to taper off clonazepam .   She states that she has thoughts of not coming here when she found out that clonazepam  will not be filled.  She has been on clonazepam  for 40 years, which was started by a psychiatrist at [facility] around the time she lost her father from cardiac disease. The medication keeps her leveled. She feels her whole life has flipped, referring that she was recommended to discontinue clonazepam .  She does not understand this, and does not accept this. She politely states that it does not make sense, and it is the most stupid things to do.  She states that she has no issues with dependence, and she never took more than prescribed.  She indeed had some medication left as she did not take extra 0.5 mg which was prescribed in the past.  She feels it is cruel to come off from the medication. She states that she does not take clonazepam  regularly.  She took one tablet this morning; prior to that, she had taken one tablet a few weeks ago.   She feels angry at her caregiver.  She never had any anger issues, but now it is a problem.  She snaps at people.  She takes things wrong.  She believes she is fighting for independence. She cannot drive due to decreased vision.  She is unable to meet her friends in [place]. She was previously very sociable and active, often attending NASCAR races and basketball  games with her husband. She describes having had a great life and being the "life of the party." She currently lives with her niece by marriage, and she never liked it. She moved there as it was difficult to live on her own since hip fracture.  She feels that they take everything away from her, while they are "doing their best, and "nothing wrong with them."  Although she knows that her niece loves her, and is doing a way lot for her, she can be selfish. She reports that her niece gets access to my chart, and she would change the password if Seylah tries to prevent this. [place] takes care of her own medication, and take care of finances, stating that she  has "sound mind."   Family- She reports estranged relationship with her daughter, who walked away with her grandson in the past, although it is getting better now. They claimed that they felt "poisoned (emotionally)," although Kenly has no ideas about this, and denies any mistreatment.  She reports grief of loss of her mother ("she was the last one.")  Depression- The patient has mood symptoms as in PHQ-9/GAD-7. She feels sad and depressed.  She is unable to do things mainly due to lack of transportation, and limited mobility due to hip pain.  She has good sleep and has fair appetite.  She denies SI.   Anxiety-she  feels anxious and agitated all the time. She cannot stand the way she feels. She denies any specific ruminative thoughts. She denies any panic attacks.   During the discussion about clonazepam , she requested to end the interview and leave. She remained calm throughout the visit and demonstrated understanding of the rationale and treatment plans.  Medication- Lexapro  10 mg (since 4/11), Propranolol  (took only twice. It caused xerostomia), clonazepam  1  mg (took one this morning, and a few weeks ago previous to this)  Support: niece by marriage Household: at camper in a yard (her niece by marriage, and her nephew are her caregiver. ) her sister  lives in the same place. Moved from [place] due to hip fracture Marital status: widow (married twice). Her second husband of 18 years of marriage deceased from lymphoma in his 72's. He reportedly had infidelity.  Number of children: 1 daughter in [place]  Employment:  Education:  8 th grade (she was "fat," and bullied) She describes her parents as "awesome." Her parents and siblings loved her.   Substance use  Tobacco Alcohol  Other substances/  Current  denies CBD cream for hip pain  Past  Social drink Marijuana in 2000  Past Treatment         Wt Readings from Last 3 Encounters:  10/23/23 210 lb 6.4 oz (95.4 kg)  10/19/23 206 lb 9.6 oz (93.7 kg)  10/10/23 211 lb 12.8 oz (96.1 kg)     Associated Signs/Symptoms: Depression Symptoms:  depressed mood, anhedonia, fatigue, (Hypo) Manic Symptoms:   denies decreased need for sleep, euphoria Anxiety Symptoms:   mild anxiety Psychotic Symptoms:   denies AH, VH, paranoia PTSD Symptoms: Negative  Past Psychiatric History:  Outpatient: used to be seen by psychiatrist at [facility] Psychiatry admission: denies Previous suicide attempt: denies Past trials of medication: cannot recall History of violence: denies History of head injury:   Previous Psychotropic Medications: Yes   Substance Abuse History in the last 12 months:  No.  Consequences of Substance Abuse: NA  Past Medical History:  Past Medical History:  Diagnosis Date   Acute GI bleeding 04/18/2023   Adenomatous polyps    Allergic state    Anemia    Anginal pain (HCC)    Anticoagulated 04/18/2023   Anxiety    Arthritis    osteoarthritis   Benign neoplasm of colon 04/21/2023   Broken rib    left   C. difficile diarrhea 10/21/2021   Cancer (HCC)    skin   CHF (congestive heart failure) (HCC)    Chicken pox    Complication of anesthesia    respiratory distress after rectocele surgery   Coronary artery disease    Depression    Eczema    Fibromyalgia    Gastric polyp  04/19/2023   GERD (gastroesophageal reflux disease)    GI bleed 04/18/2023   Headache    migraines   Hemorrhoids    History of hiatal hernia    Hypercalcemia    Hyperlipidemia    Hypertension    Hypothyroidism    Lumbar spondylosis 06/01/2017   Lumbar stenosis    Melena 04/19/2023   Obesity    Peptic ulcer disease    Shortness of breath dyspnea    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 Glinda Lapping, [facility], Spinal Fusion   CARDIAC CATHETERIZATION N/A 04/03/2015   Procedure: Left Heart Cath and Coronary Angiography;  Surgeon: Ronney Cola, MD;  Location: [facility] INVASIVE CV LAB;  Service: Cardiovascular;  Laterality: N/A;   CATARACT EXTRACTION W/PHACO Left 04/22/2019   Procedure: CATARACT EXTRACTION PHACO AND INTRAOCULAR LENS PLACEMENT (IOC) LEFT  00:45.4  12.9%  6.03;  Surgeon: Rosa College, MD;  Location: [facility];  Service: Ophthalmology;  Laterality: Left;  Latex sleep apnea   CATARACT EXTRACTION W/PHACO Right 07/01/2019   Procedure: CATARACT EXTRACTION PHACO AND INTRAOCULAR LENS PLACEMENT (IOC) RIGHT 2.38  00:24.4;  Surgeon: Rosa College, MD;  Location: [facility];  Service: Ophthalmology;  Laterality: Right;  Latex Sleep apnea   CHOLECYSTECTOMY     COLONOSCOPY WITH PROPOFOL  N/A 04/21/2023   Procedure: COLONOSCOPY WITH PROPOFOL ;  Surgeon: Ace Holder, MD;  Location: [facility] ENDOSCOPY;  Service: Gastroenterology;  Laterality: N/A;   CORONARY ANGIOPLASTY  2015   Dr. Cara Chancellor, [facility]   ESOPHAGOGASTRODUODENOSCOPY (EGD) WITH PROPOFOL  N/A 12/21/2016   Procedure: ESOPHAGOGASTRODUODENOSCOPY (EGD) WITH PROPOFOL ;  Surgeon: Cassie Click, MD;  Location: [facility] ENDOSCOPY;  Service: Endoscopy;  Laterality: N/A;   ESOPHAGOGASTRODUODENOSCOPY (EGD) WITH  PROPOFOL  N/A 12/08/2022   Procedure: ESOPHAGOGASTRODUODENOSCOPY (EGD) WITH PROPOFOL ;  Surgeon: Quintin Buckle, DO;  Location: [facility] ENDOSCOPY;  Service: Gastroenterology;  Laterality: N/A;   ESOPHAGOGASTRODUODENOSCOPY (EGD) WITH PROPOFOL  N/A 04/19/2023   Procedure: ESOPHAGOGASTRODUODENOSCOPY (EGD) WITH PROPOFOL ;  Surgeon: Ace Holder, MD;  Location: [facility] ENDOSCOPY;  Service: Gastroenterology;  Laterality: N/A;   EYE SURGERY     FRACTURE SURGERY Left 1991   Fractures Femur, [facility]   GIVENS CAPSULE STUDY N/A 06/22/2023   Procedure: GIVENS CAPSULE STUDY;  Surgeon: Marnee Sink, MD;  Location: [facility] ENDOSCOPY;  Service: Endoscopy;  Laterality: N/A;   HEMOSTASIS CLIP PLACEMENT  04/19/2023   Procedure: HEMOSTASIS CLIP PLACEMENT;  Surgeon: Ace Holder, MD;  Location: [facility] ENDOSCOPY;  Service: Gastroenterology;;   HIP ARTHROPLASTY Right 05/07/2020   Procedure: ARTHROPLASTY RIGHT HIP (HEMIARTHROPLASTY);  Surgeon: Rande Bushy, MD;  Location: [facility] ORS;  Service: Orthopedics;  Laterality: Right;   IMAGE GUIDED SINUS SURGERY Bilateral 09/28/2015   Procedure: IMAGE GUIDED SINUS SURGERY, SEPTOPLASTY, BILATERAL FRONTAL SINUSOTOMIES, BILATERAL MAXILLARY ANTROSTOMIES, BILATERAL TOTAL ETHMOIDECTOMY, BILATERAL SPHENOIDECTOMY, BILATERAL INFERIOR TURBINATE REDUCTION;  Surgeon: Mellody Sprout, MD;  Location: [facility] ORS;  Service: ENT;  Laterality: Bilateral;   LEFT HEART CATH AND CORONARY ANGIOGRAPHY Left 01/19/2023   Procedure: LEFT HEART CATH AND CORONARY ANGIOGRAPHY;  Surgeon: Percival Brace, MD;  Location: [facility] INVASIVE CV LAB;  Service: Cardiovascular;  Laterality: Left;   POLYPECTOMY  04/19/2023   Procedure: POLYPECTOMY;  Surgeon: Ace Holder, MD;  Location: [facility] END[facility];  Service: Gastroenterology;;   POLYPECTOMY  04/21/2023   Procedure: POLYPECTOMY;  Surgeon: Ace Holder, MD;  Location: [facility] ENDOSCOPY;  Service: Gastroenterology;;   RECTOCELE REPAIR       Family Psychiatric History: as below  Family History:  Family History  Problem Relation Age of Onset   Hypertension Mother    Cancer Mother    Diabetes Father    Hypertension Father    Heart failure Father    Prostate cancer Brother    Breast cancer Neg Hx     Social History:   Social History   Socioeconomic History   Marital status: Widowed    Spouse name: Not on file   Number of children: 1  Years of education: Not on file   Highest education level: 8th grade  Occupational History   Occupation: Retired  Tobacco Use   Smoking status: Former    Current packs/day: 0.00    Types: Cigarettes    Quit date: 2000    Years since quitting: 25.3    Passive exposure: Past   Smokeless tobacco: Never  Vaping Use   Vaping status: Never Used  Substance and Sexual Activity   Alcohol  use: No   Drug use: No   Sexual activity: Not Currently  Other Topics Concern   Not on file  Social History Narrative   Lives in camper in nephew's backyard. No pets.   Social Drivers of Corporate investment banker Strain: Low Risk  (06/02/2023)   Received from [facility]   Overall Financial Resource Strain (CARDIA)    Difficulty of Paying Living Expenses: Not hard at all  Food Insecurity: No Food Insecurity (07/26/2023)   Hunger Vital Sign    Worried About Running Out of Food in the Last Year: Never true    Ran Out of Food in the Last Year: Never true  Transportation Needs: No Transportation Needs (07/26/2023)   PRAPARE - Administrator, Civil Service (Medical): No    Lack of Transportation (Non-Medical): No  Physical Activity: Not on file  Stress: Not on file  Social Connections: Not on file    Additional Social History: as above  Allergies:   Allergies  Allergen Reactions   Azithromycin Diarrhea and Rash    "fainting"  Other reaction(s): Syncope  "fainting"    Other reaction(s): NOT KNOWN  "fainting"   Meloxicam Diarrhea   Morphine  Other  (See Comments) and Rash    Other reaction(s): Unknown Retain urine Other reaction(s): NOT KNOWN, Other (See Comments) Retain urine Other reaction(s): side effect of severe rash   Adhesive [Tape]     blisters   Codeine Nausea And Vomiting   Lactose Intolerance (Gi) Other (See Comments)    "stomach pain"   Red Dye #40 (Allura Red) Itching    "red food dye"   Shellfish-Derived Products Itching and Swelling   Aspirin  Rash   Latex Rash    With extended contact   Lodine [Etodolac] Rash   Morphine  And Codeine Hives and Nausea And Vomiting    Other reaction(s): UNKNOWN   Penicillins Rash and Hives    Given on 05/07/20.  No reaction noted.   Pravastatin Rash    Metabolic Disorder Labs: Lab Results  Component Value Date   HGBA1C 5.0 08/09/2023   No results found for: "PROLACTIN" No results found for: "CHOL", "TRIG", "HDL", "CHOLHDL", "VLDL", "LDLCALC" Lab Results  Component Value Date   TSH 1.814 09/22/2023    Therapeutic Level Labs: No results found for: "LITHIUM" No results found for: "CBMZ" No results found for: "VALPROATE"  Current Medications: Current Outpatient Medications  Medication Sig Dispense Refill   acetaminophen  (TYLENOL ) 500 MG tablet Take 500 mg by mouth every 6 (six) hours as needed.     albuterol  (PROVENTIL  HFA;VENTOLIN  HFA) 108 (90 BASE) MCG/ACT inhaler Inhale 2 puffs into the lungs every 6 (six) hours as needed for wheezing or shortness of breath.     amLODipine  (NORVASC ) 5 MG tablet Take 1 tablet (5 mg total) by mouth daily.     carboxymethylcellulose (REFRESH PLUS) 0.5 % SOLN Place 1 drop into both eyes daily as needed (dry eyes).     clonazePAM  (KLONOPIN ) 1 MG tablet Take  1 mg by mouth at bedtime.     escitalopram  (LEXAPRO ) 10 MG tablet Take 1 tablet (10 mg total) by mouth daily. for anxiety and depression. 90 tablet 0   fluticasone  (FLONASE ) 50 MCG/ACT nasal spray Place into both nostrils.     Fluticasone -Salmeterol (ADVAIR HFA IN) Inhale 1 puff into  the lungs daily as needed.     gabapentin  (NEURONTIN ) 600 MG tablet Take 600-1,200 mg by mouth See admin instructions. Take 1 tablet (600mg ) by mouth in the morning and 2 tablets (1200mg ) by mouth at bedtime     hydrOXYzine  (ATARAX ) 10 MG tablet Take 1-2 tablets (10-20 mg total) by mouth at bedtime as needed for anxiety. 90 tablet 0   levothyroxine  (SYNTHROID ) 125 MCG tablet Take 1 tablet by mouth every morning on an empty stomach with water only.  No food or other medications for 30 minutes. 90 tablet 2   omeprazole  (PRILOSEC) 40 MG capsule Take 1 capsule (40 mg total) by mouth 2 (two) times daily before a meal. 60 capsule 1   ondansetron  (ZOFRAN -ODT) 4 MG disintegrating tablet Take 1 tablet (4 mg total) by mouth every 8 (eight) hours as needed for nausea or vomiting. 20 tablet 0   trimethoprim  (TRIMPEX ) 100 MG tablet Take 1 tablet (100 mg total) by mouth daily. 30 tablet 11   tiZANidine  (ZANAFLEX ) 4 MG tablet Take 1 tablet (4 mg total) by mouth every 8 (eight) hours as needed for muscle spasms. (Patient not taking: Reported on 10/23/2023) 30 tablet 0   No current facility-administered medications for this visit.    Musculoskeletal: Strength & Muscle Tone: within normal limits Gait & Station:  uses a walker Patient leans: N/A  Psychiatric Specialty Exam: Review of Systems  Psychiatric/Behavioral:  Positive for dysphoric mood. Negative for agitation, behavioral problems, confusion, decreased concentration, hallucinations, self-injury, sleep disturbance and suicidal ideas. The patient is nervous/anxious. The patient is not hyperactive.   All other systems reviewed and are negative.   Blood pressure (!) 140/86, pulse 83, temperature 98.4 F (36.9 C), temperature source Temporal, height 5\' 2"  (1.575 m), weight 210 lb 6.4 oz (95.4 kg), SpO2 93%.Body mass index is 38.48 kg/m.  General Appearance: Well Groomed  Eye Contact:  Good  Speech:  Clear and Coherent  Volume:  Normal  Mood:  Anxious   Affect:  Appropriate, Congruent, Full Range, and Tearful  Thought Process:  Coherent  Orientation:  Full (Time, Place, and Person)  Thought Content:  Logical  Suicidal Thoughts:  No  Homicidal Thoughts:  No  Memory:  Immediate;   Good  Judgement:  Good  Insight:  Good  Psychomotor Activity:  Normal  Concentration:  Concentration: Good and Attention Span: Good  Recall:  Good  Fund of Knowledge:Good  Language: Good  Akathisia:  No  Handed:  Right  AIMS (if indicated):  not done  Assets:  Communication Skills Desire for Improvement  ADL's:  Intact  Cognition: WNL  Sleep:  Good   Screenings: GAD-7    Flowsheet Row Office Visit from 10/23/2023 in [facility] Office Visit from 10/19/2023 in [facility] [place] Office Visit from 08/09/2023 in [facility]  Total GAD-7 Score 10 7 7       PHQ2-9    Flowsheet Row Office Visit from 10/23/2023 in [facility] Office Visit from 10/19/2023 in [facility] [place] Office Visit from 10/06/2023 in [facility]  Nature conservation officer at [employer] from 08/09/2023 in [facility] Visit from 07/26/2023 in [facility] [place]  PHQ-2 Total Score 4 4 1 2  0  PHQ-9 Total Score 12 10 8 12  --      Flowsheet Row Office Visit from 10/23/2023 in [facility] from 10/21/2023 in [facility] Emergency Department at [facility] ED from 07/07/2023 in [facility] at [facility]  C-SSRS RISK CATEGORY No Risk No Risk No Risk       Assessment and Plan:  KASHAYLA UNGERER is a 83 y.o. year old female with a history of anxiety, CHF, CAD s/p PCI, DVT, hypertension, hypothyroidism, iron  deficiency anemia, COPD, who is referred to  taper off clonazepam .   1. Mild episode of recurrent major depressive disorder (HCC) 2. GAD (generalized anxiety disorder) Acute stressors include: demoralization due to her physical condition, living environment (living in a camper, staying with other five relatives)  Other stressors include: estranged relationship with her daughter, although she is not aware of the reason, loss of her husband, mother  History: on clonazepam  since loss of her father from cardiac disease  Exam is notable for tearfulness while she described the loss of her father years ago.  She reports sadness related to feelings of demoralization and expresses frustration toward her niece by marriage, who is trying to help her. She demonstrates good insight into others' perspectives, despite feeling stressed. She had a nurturing relationship with her parents. Given she reports relatively better mood since being on Lexapro , which was started a few weeks ago by her primary care, will maintain on the current dose to target depression and anxiety.  She will greatly benefit from supportive therapy and CBT; she will continue with Ms. Sharlyne Deans for therapy.   # benzodiazepine use- prescribed The exam is notable for calm and respectful manner, although she reports frustration of not being continued clonazepam . She has been on clonazepam  for 40 years sparingly.  Discussed potential risk of confusion, fall.  Although she disagreed to this possible side effect, she expressed understanding to hold clonazepam .  Given that she uses this only sparingly (with the last dose taken this morning, and a few weeks ago), there is low risk of withdrawal, although this will be monitored.  # cognition No apparent cognitive impairment on today's exam, although it is not formally assessed.  She declined to obtain collateral from her niece by marriage.  We will plan to assess more at the next visit.   Plan Continue lexapro  10 mg daily  Discontinue clonazepam   (previously taking 1 mg prn- the last dose this morning, and a few weeks ago) Next appointment 6/12 at 1 pm, IP - On gabapentin  600 mg TID - she sees Ms. Burlene Carpen for therapy  The patient demonstrates the following risk factors for suicide: Chronic risk factors for suicide include: psychiatric disorder of depression, anxiety . Acute risk factors for suicide include: family or marital conflict, unemployment, social withdrawal/isolation, and loss (financial, interpersonal, professional). Protective factors for this patient include: positive social support, coping skills, and hope for the future. Considering these factors, the overall suicide risk at this point appears to be low. Patient is appropriate for outpatient follow up.   A total of 60 minutes was spent on the following activities during the encounter date, which includes but is not limited  to: preparing to see the patient (e.g., reviewing tests and records), obtaining and/or reviewing separately obtained history, performing a medically necessary examination or evaluation, counseling and educating the patient, family, or caregiver, ordering medications, tests, or procedures, referring and communicating with other healthcare professionals (when not reported separately), documenting clinical information in the electronic or paper health record, independently interpreting test or lab results and communicating these results to the family or caregiver, and coordinating care (when not reported separately).   Collaboration of Care: Other reviewed notes in Epic  Patient/Guardian was advised Release of Information must be obtained prior to any record release in order to collaborate their care with an outside provider. Patient/Guardian was advised if they have not already done so to contact the registration department to sign all necessary forms in order for us  to release information regarding their care.   Consent: Patient/Guardian gives verbal consent  for treatment and assignment of benefits for services provided during this visit. Patient/Guardian expressed understanding and agreed to proceed.   Todd Fossa, MD 4/28/20253:03 PM

## 2023-10-21 ENCOUNTER — Encounter (HOSPITAL_BASED_OUTPATIENT_CLINIC_OR_DEPARTMENT_OTHER): Payer: Self-pay

## 2023-10-21 ENCOUNTER — Other Ambulatory Visit: Payer: Self-pay

## 2023-10-21 ENCOUNTER — Emergency Department (HOSPITAL_BASED_OUTPATIENT_CLINIC_OR_DEPARTMENT_OTHER)
Admission: EM | Admit: 2023-10-21 | Discharge: 2023-10-21 | Disposition: A | Discharge status: Home / Self Care | Attending: Emergency Medicine | Admitting: Emergency Medicine

## 2023-10-21 ENCOUNTER — Emergency Department (HOSPITAL_BASED_OUTPATIENT_CLINIC_OR_DEPARTMENT_OTHER)

## 2023-10-21 DIAGNOSIS — J019 Acute sinusitis, unspecified: Secondary | ICD-10-CM | POA: Insufficient documentation

## 2023-10-21 DIAGNOSIS — R600 Localized edema: Secondary | ICD-10-CM | POA: Insufficient documentation

## 2023-10-21 DIAGNOSIS — R42 Dizziness and giddiness: Secondary | ICD-10-CM | POA: Diagnosis present

## 2023-10-21 DIAGNOSIS — I2510 Atherosclerotic heart disease of native coronary artery without angina pectoris: Secondary | ICD-10-CM | POA: Diagnosis not present

## 2023-10-21 DIAGNOSIS — Z79899 Other long term (current) drug therapy: Secondary | ICD-10-CM | POA: Insufficient documentation

## 2023-10-21 DIAGNOSIS — Z91040 Latex allergy status: Secondary | ICD-10-CM | POA: Diagnosis not present

## 2023-10-21 DIAGNOSIS — R110 Nausea: Secondary | ICD-10-CM

## 2023-10-21 LAB — CBC WITH DIFFERENTIAL/PLATELET
Abs Immature Granulocytes: 0.02 10*3/uL (ref 0.00–0.07)
Basophils Absolute: 0.1 10*3/uL (ref 0.0–0.1)
Basophils Relative: 1 %
Eosinophils Absolute: 0.1 10*3/uL (ref 0.0–0.5)
Eosinophils Relative: 1 %
HCT: 42.3 % (ref 36.0–46.0)
Hemoglobin: 13.8 g/dL (ref 12.0–15.0)
Immature Granulocytes: 0 %
Lymphocytes Relative: 16 %
Lymphs Abs: 1.1 10*3/uL (ref 0.7–4.0)
MCH: 28.8 pg (ref 26.0–34.0)
MCHC: 32.6 g/dL (ref 30.0–36.0)
MCV: 88.3 fL (ref 80.0–100.0)
Monocytes Absolute: 0.7 10*3/uL (ref 0.1–1.0)
Monocytes Relative: 10 %
Neutro Abs: 5.2 10*3/uL (ref 1.7–7.7)
Neutrophils Relative %: 72 %
Platelets: 269 10*3/uL (ref 150–400)
RBC: 4.79 MIL/uL (ref 3.87–5.11)
RDW: 17 % — ABNORMAL HIGH (ref 11.5–15.5)
WBC: 7.1 10*3/uL (ref 4.0–10.5)
nRBC: 0 % (ref 0.0–0.2)

## 2023-10-21 LAB — COMPREHENSIVE METABOLIC PANEL WITH GFR
ALT: 23 U/L (ref 0–44)
AST: 23 U/L (ref 15–41)
Albumin: 4.2 g/dL (ref 3.5–5.0)
Alkaline Phosphatase: 90 U/L (ref 38–126)
Anion gap: 11 (ref 5–15)
BUN: 19 mg/dL (ref 8–23)
CO2: 25 mmol/L (ref 22–32)
Calcium: 9.3 mg/dL (ref 8.9–10.3)
Chloride: 96 mmol/L — ABNORMAL LOW (ref 98–111)
Creatinine, Ser: 0.81 mg/dL (ref 0.44–1.00)
GFR, Estimated: >60 mL/min (ref >60)
Glucose, Bld: 100 mg/dL — ABNORMAL HIGH (ref 70–99)
Potassium: 3.9 mmol/L (ref 3.5–5.1)
Sodium: 132 mmol/L — ABNORMAL LOW (ref 135–145)
Total Bilirubin: 0.5 mg/dL (ref 0.0–1.2)
Total Protein: 6.9 g/dL (ref 6.5–8.1)

## 2023-10-21 MED ORDER — LACTATED RINGERS IV BOLUS
1000.0000 mL | Freq: Once | INTRAVENOUS | Status: AC
  Administered 2023-10-21: 1000 mL via INTRAVENOUS

## 2023-10-21 MED ORDER — ONDANSETRON 4 MG PO TBDP: 4.0000 mg | ORAL_TABLET | Freq: Three times a day (TID) | ORAL | 0 refills | Status: DC | PRN

## 2023-10-21 MED ORDER — ONDANSETRON HCL 4 MG/2ML IJ SOLN
4.0000 mg | Freq: Once | INTRAMUSCULAR | Status: AC
  Administered 2023-10-21: 4 mg via INTRAVENOUS
  Filled 2023-10-21: qty 2

## 2023-10-21 NOTE — ED Triage Notes (Signed)
 Pt bib by [facility] c/o dizziness x approx 2 weeks. Has been evaluated by PCP, diagnosed w/ sinus infection, taking Doxy since Wednesday, c/o frontal headache, nausea no vomiting today.

## 2023-10-21 NOTE — Discharge Instructions (Signed)
 You may additionally want to try sinus rinse with a Nettie pot or saline spray to help drain the fluid in your ear.  You can use the nausea medication as needed.  The blood work today looks good and the imaging.

## 2023-10-21 NOTE — ED Provider Notes (Signed)
 [place] EMERGENCY DEPARTMENT AT [facility] Provider Note   CSN: [id number] Arrival date & time: 10/21/23  1100     History  Chief Complaint  Patient presents with   Dizziness    Stacy Allen is a 83 y.o. female.  Patient is an 83 year old female with a history of CAD, GERD, hypertension, hyperlipidemia, hypothyroidism, peptic ulcer disease, anemia requiring iron  transfusions, CHF who is presenting today with complaints of dizziness that have been present for a long time but seem to have been worse in the last 2 weeks as well as the last week with sinus congestion and a sore throat.  She reports the dizziness is present almost all the time but is much worse if she moves her head or tries to stand and walk.  At times she even feels like she might fall pass out.  She has not had a fever or significant cough or shortness of breath.  She did recently get placed on doxycycline  for concern for a sinus infection but reports she was just feeling unwell.  She has also been nauseated but has not vomited and has been having a poor appetite.  She denies any unilateral numbness weakness or speech difficulty.  No diarrhea or abdominal pain.  The history is provided by the patient.  Dizziness      Home Medications Prior to Admission medications   Medication Sig Start Date End Date Taking? Authorizing Provider  ondansetron  (ZOFRAN -ODT) 4 MG disintegrating tablet Take 1 tablet (4 mg total) by mouth every 8 (eight) hours as needed for nausea or vomiting. 10/21/23  Yes Almond Army, MD  acetaminophen  (TYLENOL ) 500 MG tablet Take 500 mg by mouth every 6 (six) hours as needed.    [provider]  albuterol  (PROVENTIL  HFA;VENTOLIN  HFA) 108 (90 BASE) MCG/ACT inhaler Inhale 2 puffs into the lungs every 6 (six) hours as needed for wheezing or shortness of breath.    [provider]  amLODipine  (NORVASC ) 5 MG tablet Take 1 tablet (5 mg total) by mouth daily. 10/19/23   Dugal,  Tabitha, FNP  carboxymethylcellulose (REFRESH PLUS) 0.5 % SOLN Place 1 drop into both eyes daily as needed (dry eyes).    [provider]  clonazePAM  (KLONOPIN ) 1 MG tablet Take 1 mg by mouth at bedtime.    [provider]  doxycycline  (VIBRA -TABS) 100 MG tablet Take 1 tablet (100 mg total) by mouth 2 (two) times daily for 10 days. 10/19/23 10/29/23  Dugal, Tabitha, FNP  escitalopram  (LEXAPRO ) 10 MG tablet Take 1 tablet (10 mg total) by mouth daily. for anxiety and depression. 10/06/23   Clark, Katherine K, NP  fluticasone  (FLONASE ) 50 MCG/ACT nasal spray Place into both nostrils. 09/14/23   [provider]  Fluticasone -Salmeterol (ADVAIR HFA IN) Inhale 1 puff into the lungs daily as needed.    [provider]  gabapentin  (NEURONTIN ) 600 MG tablet Take 600-1,200 mg by mouth See admin instructions. Take 1 tablet (600mg ) by mouth in the morning and 2 tablets (1200mg ) by mouth at bedtime 04/24/20   [provider]  hydrOXYzine  (ATARAX ) 10 MG tablet Take 1-2 tablets (10-20 mg total) by mouth at bedtime as needed for anxiety. 08/09/23   Clark, Katherine K, NP  levothyroxine  (SYNTHROID ) 125 MCG tablet Take 1 tablet by mouth every morning on an empty stomach with water only.  No food or other medications for 30 minutes. 09/18/23   [name], NP  omeprazole  (PRILOSEC) 40 MG capsule Take 1 capsule (40  mg total) by mouth 2 (two) times daily before a meal. 06/23/23   Melodi Sprung, DO  propranolol  (INDERAL ) 10 MG tablet Take 1-2 tablets (10-20 mg total) by mouth daily as needed. For anxiety 10/06/23   Clark, Katherine K, NP  tiZANidine  (ZANAFLEX ) 4 MG tablet Take 1 tablet (4 mg total) by mouth every 8 (eight) hours as needed for muscle spasms. 08/09/23   Clark, Katherine K, NP  trimethoprim  (TRIMPEX ) 100 MG tablet Take 1 tablet (100 mg total) by mouth daily. 05/15/23   MacDiarmid, Scott, MD      Allergies    Azithromycin, Meloxicam, Morphine , Adhesive [tape],  Codeine, Lactose intolerance (gi), Red dye #40 (allura red), Shellfish-derived products, Aspirin , Latex, Lodine [etodolac], Morphine  and codeine, Penicillins, and Pravastatin    Review of Systems   Review of Systems  Neurological:  Positive for dizziness.    Physical Exam Updated Vital Signs BP (!) 180/96   Pulse (!) 59   Resp 18   SpO2 98%  Physical Exam Vitals and nursing note reviewed.  Constitutional:      General: She is not in acute distress.    Appearance: She is well-developed.  HENT:     Head: Normocephalic and atraumatic.     Right Ear: Tympanic membrane normal.     Left Ear: A middle ear effusion is present.     Mouth/Throat:     Mouth: Mucous membranes are moist.     Pharynx: Uvula midline. Posterior oropharyngeal erythema present. No oropharyngeal exudate.  Eyes:     Extraocular Movements: Extraocular movements intact.     Pupils: Pupils are equal, round, and reactive to light.     Comments: No nystagmus  Cardiovascular:     Rate and Rhythm: Normal rate and regular rhythm.     Heart sounds: Normal heart sounds. No murmur heard.    No friction rub.  Pulmonary:     Effort: Pulmonary effort is normal.     Breath sounds: Normal breath sounds. No wheezing or rales.  Abdominal:     General: Bowel sounds are normal. There is no distension.     Palpations: Abdomen is soft.     Tenderness: There is no abdominal tenderness. There is no guarding or rebound.  Musculoskeletal:        General: No tenderness. Normal range of motion.     Right lower leg: Edema present.     Left lower leg: Edema present.     Comments: Trace edema bilateral ankles  Lymphadenopathy:     Cervical: No cervical adenopathy.  Skin:    General: Skin is warm and dry.     Findings: No rash.  Neurological:     Mental Status: She is alert and oriented to person, place, and time. Mental status is at baseline.     Cranial Nerves: No cranial nerve deficit, dysarthria or facial asymmetry.      Sensory: Sensation is intact.     Motor: No weakness or pronator drift.     Coordination: Coordination is intact. Heel to Shin Test normal.  Psychiatric:        Mood and Affect: Mood normal.        Behavior: Behavior normal.     ED Results / Procedures / Treatments   Labs (all labs ordered are listed, but only abnormal results are displayed) Labs Reviewed  CBC WITH DIFFERENTIAL/PLATELET - Abnormal; Notable for the following components:      Result Value   RDW 17.0 (*)  All other components within normal limits  COMPREHENSIVE METABOLIC PANEL WITH GFR - Abnormal; Notable for the following components:   Sodium 132 (*)    Chloride 96 (*)    Glucose, Bld 100 (*)    All other components within normal limits    EKG None  Radiology CT Head Wo Contrast Result Date: 10/21/2023 CLINICAL DATA:  Dizziness for 2 weeks.  Hypertension. EXAM: CT HEAD WITHOUT CONTRAST TECHNIQUE: Contiguous axial images were obtained from the base of the skull through the vertex without intravenous contrast. RADIATION DOSE REDUCTION: This exam was performed according to the departmental dose-optimization program which includes automated exposure control, adjustment of the mA and/or kV according to patient size and/or use of iterative reconstruction technique. COMPARISON:  CT head without contrast 04/17/2023 FINDINGS: Brain: Atrophy and white matter changes are mildly advanced for age, similar the prior exam. Deep brain nuclei are within normal limits. The ventricles are of normal size. No significant extraaxial fluid collection is present. The brainstem and cerebellum are within normal limits. Midline structures are within normal limits. Vascular: No hyperdense vessel or unexpected calcification. Skull: Calvarium is intact. No focal lytic or blastic lesions are present. No significant extracranial soft tissue lesion is present. Sinuses/Orbits: The paranasal sinuses and mastoid air cells are clear. Bilateral lens  replacements are noted. Globes and orbits are otherwise unremarkable. IMPRESSION: 1. No acute intracranial abnormality or significant interval change. 2. Atrophy and white matter changes are mildly advanced for age, similar the prior exam. This likely reflects the sequela of chronic microvascular ischemia. Electronically Signed   By: Audree Leas M.D.   On: 10/21/2023 12:28    Procedures Procedures    Medications Ordered in ED Medications  lactated ringers  bolus 1,000 mL (1,000 mLs Intravenous New Bag/Given 10/21/23 1254)  ondansetron  (ZOFRAN ) injection 4 mg (4 mg Intravenous Given 10/21/23 1254)    ED Course/ Medical Decision Making/ A&P                                 Medical Decision Making Amount and/or Complexity of Data Reviewed Labs: ordered. Decision-making details documented in ED Course. Radiology: ordered and independent interpretation performed. Decision-making details documented in ED Course. ECG/medicine tests: ordered and independent interpretation performed. Decision-making details documented in ED Course.  Risk Prescription drug management.   Pt with multiple medical problems and comorbidities and presenting today with a complaint that caries a high risk for morbidity and mortality.  Patient here today with multiple vague symptoms of sore throat, dizziness, general nausea.  She did recently start on doxycycline  for a sinus infection.  Concern for possible anemia, dysrhythmia, electrolyte abnormality, AKI due to persistent nausea.  Lower suspicion for stroke given patient's neuroexam without acute findings. I independently interpreted patient's labs and EKG and EKG today is normal without dysrhythmia or acute findings, CBC within normal limits with normal hemoglobin today, CMP with normal renal function and electrolytes.  I have independently visualized and interpreted pt's images today. Head CT without evidence of intracranial hemorrhage and radiology reports no  acute abnormalities other than atrophy and white matter changes similar to prior exam and likely reflect sequela of chronic microvascular ischemia.  Patient was given fluids and antiemetics and on reevaluation patient is improved.  At this time discussed the findings with the patient and she is relieved that labs are normal.  She will continue her current antibiotic and return for worsening symptoms.  Final Clinical Impression(s) / ED Diagnoses Final diagnoses:  Subacute sinusitis, unspecified location  Nausea    Rx / DC Orders ED Discharge Orders          Ordered    ondansetron  (ZOFRAN -ODT) 4 MG disintegrating tablet  Every 8 hours PRN        10/21/23 1347              Almond Army, MD 10/21/23 1347

## 2023-10-23 ENCOUNTER — Ambulatory Visit: Admitting: Psychiatry

## 2023-10-23 ENCOUNTER — Encounter: Payer: Self-pay | Admitting: Psychiatry

## 2023-10-23 VITALS — BP 140/86 | HR 83 | Temp 98.4°F | Ht 62.0 in | Wt 210.4 lb

## 2023-10-23 DIAGNOSIS — F411 Generalized anxiety disorder: Secondary | ICD-10-CM

## 2023-10-23 DIAGNOSIS — F330 Major depressive disorder, recurrent, mild: Secondary | ICD-10-CM | POA: Diagnosis not present

## 2023-10-23 NOTE — Patient Instructions (Signed)
 Continue lexapro  10 mg daily  Next appointment 6/12 at 1 pm

## 2023-10-25 ENCOUNTER — Encounter: Payer: Self-pay | Admitting: Primary Care

## 2023-10-25 ENCOUNTER — Ambulatory Visit (INDEPENDENT_AMBULATORY_CARE_PROVIDER_SITE_OTHER): Admitting: Primary Care

## 2023-10-25 VITALS — BP 152/92 | HR 72 | Temp 97.5°F | Ht 62.0 in | Wt 208.0 lb

## 2023-10-25 DIAGNOSIS — I10 Essential (primary) hypertension: Secondary | ICD-10-CM | POA: Diagnosis not present

## 2023-10-25 DIAGNOSIS — F411 Generalized anxiety disorder: Secondary | ICD-10-CM

## 2023-10-25 MED ORDER — AMLODIPINE BESYLATE 5 MG PO TABS: 5.0000 mg | ORAL_TABLET | Freq: Every day | ORAL | 0 refills | Status: AC

## 2023-10-25 NOTE — Assessment & Plan Note (Signed)
 Reviewed psychiatry notes from April 2025.  Agreed to remain off clonazepam  as this is not best practice for daily use.  Also on beers criteria list for the elderly.  Continue Lexapro  10 mg daily. Discussed that she can use propranolol  as needed.

## 2023-10-25 NOTE — Patient Instructions (Signed)
 Continue taking amlodipine  5 mg once daily for blood pressure.  Please follow-up with either myself or your PCP within 2 weeks for BP check.  It was a pleasure to see you today!

## 2023-10-25 NOTE — Assessment & Plan Note (Signed)
 Above goal, has only been taking 5 mg for 6 days. Continue 5 mg daily.  She refuses to return to our office for follow-up.  I recommended that she reestablish with her prior PCP and follow-up within 2 weeks.  Refills provided for 30-day supply.

## 2023-10-25 NOTE — Progress Notes (Signed)
 Subjective:    Patient ID: Stacy Allen, female    DOB: 03-Jan-1941, 83 y.o.   MRN: [id number]  Hypertension Associated symptoms include headaches. Pertinent negatives include no chest pain or shortness of breath.    Stacy Allen is a very pleasant 83 y.o. female with a history of hypertension, COPD, hypothyroidism, who presents today for follow-up of hypertension.  She was last evaluated by Tabitha NP on 10/10/2023 for headaches and hypertension.  Blood pressure readings during hematology visit was 170/100.  Headaches occurred same day.  During this visit she was initiated on amlodipine  2.5 mg daily.  Her amlodipine  was increased to 5 mg on 10/19/23.   She is needing refills for her amlodipine  5 mg as this was never sent to her pharmacy. She is upset that her clonazepam  has been discontinued by me and by the psychiatrist. Her last clonazepam  pill was on 10/23/23. She had been weaning down.   She does not plan to continue to follow with our practice and with the psychiatrist as neither of us  are "listening".  She desires to resume clonazepam . Evaluated by psychiatry on 10/15/2023, recommendations were to continue Lexapro  10 mg daily, continue propranolol  as needed, continue with therapy, remain off clonazepam .  She attributes her high blood pressure to discontinuation of her clonazepam .  She was weaned properly over 2 to 3-week period.   BP Readings from Last 3 Encounters:  10/25/23 (!) 152/92  10/23/23 (!) 140/86  10/21/23 (!) 168/89        Review of Systems  Respiratory:  Negative for shortness of breath.   Cardiovascular:  Negative for chest pain.  Neurological:  Positive for headaches.  Psychiatric/Behavioral:  The patient is nervous/anxious.          Past Medical History:  Diagnosis Date   Acute GI bleeding 04/18/2023   Adenomatous polyps    Allergic state    Anemia    Anginal pain (HCC)    Anticoagulated 04/18/2023   Anxiety    Arthritis    osteoarthritis    Benign neoplasm of colon 04/21/2023   Broken rib    left   C. difficile diarrhea 10/21/2021   Cancer (HCC)    skin   CHF (congestive heart failure) (HCC)    Chicken pox    Complication of anesthesia    respiratory distress after rectocele surgery   Coronary artery disease    Depression    Eczema    Fibromyalgia    Gastric polyp 04/19/2023   GERD (gastroesophageal reflux disease)    GI bleed 04/18/2023   Headache    migraines   Hemorrhoids    History of hiatal hernia    Hypercalcemia    Hyperlipidemia    Hypertension    Hypothyroidism    Lumbar spondylosis 06/01/2017   Lumbar stenosis    Melena 04/19/2023   Obesity    Peptic ulcer disease    Shortness of breath dyspnea    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   Sleep apnea    No CPAP   Symptomatic anemia 04/18/2023   Vertigo    Vitamin D  deficiency     Social History   Socioeconomic History   Marital status: Widowed    Spouse name: Not on file   Number of children: 1   Years of education: Not on file   Highest education level: 8th grade  Occupational History   Occupation: Retired  Tobacco Use   Smoking status: Former    Current packs/day:  0.00    Types: Cigarettes    Quit date: 2000    Years since quitting: 25.3    Passive exposure: Past   Smokeless tobacco: Never  Vaping Use   Vaping status: Never Used  Substance and Sexual Activity   Alcohol  use: No   Drug use: No   Sexual activity: Not Currently  Other Topics Concern   Not on file  Social History Narrative   Lives in camper in nephew's backyard. No pets.   Social Drivers of Corporate investment banker Strain: Low Risk  (06/02/2023)   Received from [facility]   Overall Financial Resource Strain (CARDIA)    Difficulty of Paying Living Expenses: Not hard at all  Food Insecurity: No Food Insecurity (07/26/2023)   Hunger Vital Sign    Worried About Running Out of Food in the Last Year: Never true    Ran Out of Food in the Last  Year: Never true  Transportation Needs: No Transportation Needs (07/26/2023)   PRAPARE - Administrator, Civil Service (Medical): No    Lack of Transportation (Non-Medical): No  Physical Activity: Not on file  Stress: Not on file  Social Connections: Not on file  Intimate Partner Violence: Not At Risk (07/26/2023)   Humiliation, Afraid, Rape, and Kick questionnaire    Fear of Current or Ex-Partner: No    Emotionally Abused: No    Physically Abused: No    Sexually Abused: No    Past Surgical History:  Procedure Laterality Date   ABDOMINAL HYSTERECTOMY     BACK SURGERY  2007   Dr. Glinda Lapping, [facility], Spinal Fusion   CARDIAC CATHETERIZATION N/A 04/03/2015   Procedure: Left Heart Cath and Coronary Angiography;  Surgeon: Ronney Cola, MD;  Location: [facility] INVASIVE CV LAB;  Service: Cardiovascular;  Laterality: N/A;   CATARACT EXTRACTION W/PHACO Left 04/22/2019   Procedure: CATARACT EXTRACTION PHACO AND INTRAOCULAR LENS PLACEMENT (IOC) LEFT  00:45.4  12.9%  6.03;  Surgeon: Rosa College, MD;  Location: [facility] SURGERY CNTR;  Service: Ophthalmology;  Laterality: Left;  Latex sleep apnea   CATARACT EXTRACTION W/PHACO Right 07/01/2019   Procedure: CATARACT EXTRACTION PHACO AND INTRAOCULAR LENS PLACEMENT (IOC) RIGHT 2.38  00:24.4;  Surgeon: Rosa College, MD;  Location: [facility] SURGERY CNTR;  Service: Ophthalmology;  Laterality: Right;  Latex Sleep apnea   CHOLECYSTECTOMY     COLONOSCOPY WITH PROPOFOL  N/A 04/21/2023   Procedure: COLONOSCOPY WITH PROPOFOL ;  Surgeon: Ace Holder, MD;  Location: [facility] ENDOSCOPY;  Service: Gastroenterology;  Laterality: N/A;   CORONARY ANGIOPLASTY  2015   Dr. Cara Chancellor, [facility] Cath Lab   ESOPHAGOGASTRODUODENOSCOPY (EGD) WITH PROPOFOL  N/A 12/21/2016   Procedure: ESOPHAGOGASTRODUODENOSCOPY (EGD) WITH PROPOFOL ;  Surgeon: Cassie Click, MD;  Location: [facility] ENDOSCOPY;  Service: Endoscopy;  Laterality: N/A;   ESOPHAGOGASTRODUODENOSCOPY (EGD) WITH  PROPOFOL  N/A 12/08/2022   Procedure: ESOPHAGOGASTRODUODENOSCOPY (EGD) WITH PROPOFOL ;  Surgeon: Quintin Buckle, DO;  Location: [facility] ENDOSCOPY;  Service: Gastroenterology;  Laterality: N/A;   ESOPHAGOGASTRODUODENOSCOPY (EGD) WITH PROPOFOL  N/A 04/19/2023   Procedure: ESOPHAGOGASTRODUODENOSCOPY (EGD) WITH PROPOFOL ;  Surgeon: Ace Holder, MD;  Location: [facility] ENDOSCOPY;  Service: Gastroenterology;  Laterality: N/A;   EYE SURGERY     FRACTURE SURGERY Left 1991   Fractures Femur,  Regional   GIVENS CAPSULE STUDY N/A 06/22/2023   Procedure: GIVENS CAPSULE STUDY;  Surgeon: Marnee Sink, MD;  Location: [facility] ENDOSCOPY;  Service: Endoscopy;  Laterality: N/A;   HEMOSTASIS CLIP PLACEMENT  04/19/2023  Procedure: HEMOSTASIS CLIP PLACEMENT;  Surgeon: Ace Holder, MD;  Location: [facility] ENDOSCOPY;  Service: Gastroenterology;;   HIP ARTHROPLASTY Right 05/07/2020   Procedure: ARTHROPLASTY RIGHT HIP (HEMIARTHROPLASTY);  Surgeon: Rande Bushy, MD;  Location: [facility] ORS;  Service: Orthopedics;  Laterality: Right;   IMAGE GUIDED SINUS SURGERY Bilateral 09/28/2015   Procedure: IMAGE GUIDED SINUS SURGERY, SEPTOPLASTY, BILATERAL FRONTAL SINUSOTOMIES, BILATERAL MAXILLARY ANTROSTOMIES, BILATERAL TOTAL ETHMOIDECTOMY, BILATERAL SPHENOIDECTOMY, BILATERAL INFERIOR TURBINATE REDUCTION;  Surgeon: Mellody Sprout, MD;  Location: [facility] ORS;  Service: ENT;  Laterality: Bilateral;   LEFT HEART CATH AND CORONARY ANGIOGRAPHY Left 01/19/2023   Procedure: LEFT HEART CATH AND CORONARY ANGIOGRAPHY;  Surgeon: Percival Brace, MD;  Location: [facility] INVASIVE CV LAB;  Service: Cardiovascular;  Laterality: Left;   POLYPECTOMY  04/19/2023   Procedure: POLYPECTOMY;  Surgeon: Ace Holder, MD;  Location: [facility] ENDOSCOPY;  Service: Gastroenterology;;   POLYPECTOMY  04/21/2023   Procedure: POLYPECTOMY;  Surgeon: Ace Holder, MD;  Location: [facility] ENDOSCOPY;  Service: Gastroenterology;;   RECTOCELE REPAIR       Family History  Problem Relation Age of Onset   Hypertension Mother    Cancer Mother    Diabetes Father    Hypertension Father    Heart failure Father    Prostate cancer Brother    Breast cancer Neg Hx     Allergies  Allergen Reactions   Azithromycin Diarrhea and Rash    "fainting"  Other reaction(s): Syncope  "fainting"    Other reaction(s): NOT KNOWN  "fainting"   Meloxicam Diarrhea   Morphine  Other (See Comments) and Rash    Other reaction(s): Unknown Retain urine Other reaction(s): NOT KNOWN, Other (See Comments) Retain urine Other reaction(s): side effect of severe rash   Adhesive [Tape]     blisters   Codeine Nausea And Vomiting   Lactose Intolerance (Gi) Other (See Comments)    "stomach pain"   Red Dye #40 (Allura Red) Itching    "red food dye"   Shellfish-Derived Products Itching and Swelling   Aspirin  Rash   Latex Rash    With extended contact   Lodine [Etodolac] Rash   Morphine  And Codeine Hives and Nausea And Vomiting    Other reaction(s): UNKNOWN   Penicillins Rash and Hives    Given on 05/07/20.  No reaction noted.   Pravastatin Rash    Current Outpatient Medications on File Prior to Visit  Medication Sig Dispense Refill   albuterol  (PROVENTIL  HFA;VENTOLIN  HFA) 108 (90 BASE) MCG/ACT inhaler Inhale 2 puffs into the lungs every 6 (six) hours as needed for wheezing or shortness of breath.     carboxymethylcellulose (REFRESH PLUS) 0.5 % SOLN Place 1 drop into both eyes daily as needed (dry eyes).     clonazePAM  (KLONOPIN ) 1 MG tablet Take 1 mg by mouth at bedtime.     escitalopram  (LEXAPRO ) 10 MG tablet Take 1 tablet (10 mg total) by mouth daily. for anxiety and depression. 90 tablet 0   fluticasone  (FLONASE ) 50 MCG/ACT nasal spray Place into both nostrils.     Fluticasone -Salmeterol (ADVAIR HFA IN) Inhale 1 puff into the lungs daily as needed.     gabapentin  (NEURONTIN ) 600 MG tablet Take 600-1,200 mg by mouth See admin instructions. Take 1  tablet (600mg ) by mouth in the morning and 2 tablets (1200mg ) by mouth at bedtime     hydrOXYzine  (ATARAX ) 10 MG tablet Take 1-2 tablets (10-20 mg total) by mouth at bedtime as needed for anxiety. 90 tablet 0   levothyroxine  (  SYNTHROID ) 125 MCG tablet Take 1 tablet by mouth every morning on an empty stomach with water only.  No food or other medications for 30 minutes. 90 tablet 2   omeprazole  (PRILOSEC) 40 MG capsule Take 1 capsule (40 mg total) by mouth 2 (two) times daily before a meal. 60 capsule 1   ondansetron  (ZOFRAN -ODT) 4 MG disintegrating tablet Take 1 tablet (4 mg total) by mouth every 8 (eight) hours as needed for nausea or vomiting. 20 tablet 0   trimethoprim  (TRIMPEX ) 100 MG tablet Take 1 tablet (100 mg total) by mouth daily. 30 tablet 11   acetaminophen  (TYLENOL ) 500 MG tablet Take 500 mg by mouth every 6 (six) hours as needed. (Patient not taking: Reported on 10/25/2023)     tiZANidine  (ZANAFLEX ) 4 MG tablet Take 1 tablet (4 mg total) by mouth every 8 (eight) hours as needed for muscle spasms. (Patient not taking: Reported on 10/25/2023) 30 tablet 0   No current facility-administered medications on file prior to visit.    BP (!) 152/92 (Patient Position: Sitting, Cuff Size: Normal)   Pulse 72   Temp (!) 97.5 F (36.4 C) (Oral)   Ht 5\' 2"  (1.575 m)   Wt 208 lb (94.3 kg)   SpO2 97%   BMI 38.04 kg/m  Objective:   Physical Exam Cardiovascular:     Rate and Rhythm: Normal rate and regular rhythm.  Pulmonary:     Effort: Pulmonary effort is normal.     Breath sounds: Normal breath sounds.  Musculoskeletal:     Cervical back: Neck supple.  Skin:    General: Skin is warm and dry.  Neurological:     Mental Status: She is alert and oriented to person, place, and time.  Psychiatric:        Mood and Affect: Mood normal.           Assessment & Plan:  GAD (generalized anxiety disorder) Assessment & Plan: Reviewed psychiatry notes from April 2025.  Agreed to remain off  clonazepam  as this is not best practice for daily use.  Also on beers criteria list for the elderly.  Continue Lexapro  10 mg daily. Discussed that she can use propranolol  as needed.     Essential hypertension Assessment & Plan: Above goal, has only been taking 5 mg for 6 days. Continue 5 mg daily.  She refuses to return to our office for follow-up.  I recommended that she reestablish with her prior PCP and follow-up within 2 weeks.  Refills provided for 30-day supply.  Orders: -     amLODIPine  Besylate; Take 1 tablet (5 mg total) by mouth daily. for blood pressure.  Dispense: 30 tablet; Refill: 0        [name], NP

## 2023-11-14 ENCOUNTER — Encounter (INDEPENDENT_AMBULATORY_CARE_PROVIDER_SITE_OTHER): Payer: Self-pay

## 2023-11-16 ENCOUNTER — Ambulatory Visit: Admitting: Clinical

## 2023-11-16 DIAGNOSIS — F419 Anxiety disorder, unspecified: Secondary | ICD-10-CM | POA: Diagnosis not present

## 2023-11-16 DIAGNOSIS — F330 Major depressive disorder, recurrent, mild: Secondary | ICD-10-CM

## 2023-11-16 NOTE — Progress Notes (Signed)
   Stacy Barthel, LCSW

## 2023-11-16 NOTE — Progress Notes (Signed)
 [facility] Counselor/Therapist Progress Note  Patient ID: GRACY EHLY, MRN: [id number],    Date: 11/16/2023  Time Spent: 12:34pm -1:37pm : 63 minutes   Treatment Type: Individual Therapy  Reported Symptoms: sadness, crying  Mental Status Exam: Appearance:  Neat and Well Groomed     Behavior: Appropriate  Motor: Normal  Speech/Language:  Clear and Coherent and Normal Rate  Affect: Tearful  Mood: sad  Thought process: normal  Thought content:   WNL  Sensory/Perceptual disturbances:   WNL  Orientation: oriented to person, place, time/date, and situation  Attention: Good  Concentration: Good  Memory: WNL  Fund of knowledge:  Good  Insight:   Good  Judgment:  Good  Impulse Control: Good   Risk Assessment: Danger to Self:  No Patient denied current suicidal ideation  Self-injurious Behavior: No Danger to Others: No Patient denied current homicidal ideation Duty to Warn:no Physical Aggression / Violence:No  Access to Firearms a concern: No  Gang Involvement:No   Subjective: Patient stated, "not well, I think it's getting better" in response to events since last session. Patient reported she has changed PCP's. Patient reported patient's new PCP prescribed Celexa  and patient reported she started the Celexa  today. Patient stated, "I've been crying for two weeks". Patient reported she completed paperwork for [place]'s transportation services but has not received a response at this time. Patient reported thoughts of "why create a family just to die", "why create children for them to go away". Patient stated, "I feel like I've lost everything and I don't understand it". Patient stated, "a loss of everything that could be of an aid to me", side effects of tapering off klonopin , and conversation with patient's sister are triggers for sadness. Patient reported patient's sister makes negative statements towards patient and patient reported feeling hurt by sister's  statements. Patient reported patient's sister has been diagnosed with dementia. Patient stated, "I feel so overwhelmed by negativity".  Patient stated, "I had a handful" in response to patient's homework to identify 1 positive per day. Patient stated, "I'm sad, I'm lonely" in response to patient's current mood.   Interventions: Cognitive Behavioral Therapy. Clinician conducted session in person at clinician's office at [facility]. Reviewed events since last session and assessed for changes. Discussed patient's recent visit with medical provider and patient's decision to change providers.  Discussed the status of accessing transportation resources. Explored and identified triggers for sadness. Explored and identified thoughts associated with feelings of sadness. Provided psycho education related to dementia. Reviewed patient's homework to identify positives daily. Provided psycho education related to use of positive self talk. Explored and identified coping strategies, such as, use of positive affirmations, listening to spiritual presentations on Facebook, reaching out to friends, positive self talk. Clinician requested for homework patient continue to practice challenging thoughts, mindfulness, positive self talk, and identify 1 positive for each day.   Collaboration of Care: not required at this time   Diagnosis:  Mild episode of recurrent major depressive disorder (HCC)   Anxiety disorder, unspecified type     Plan: Patient is to utilize [employer], thought re-framing, mindfulness and coping strategies to decrease symptoms associated with their diagnosis. Frequency: bi-weekly  Modality: individual      Long-term goal:   Patient stated, "I guess I want to see my being able to recognize and appreciate what I have instead of focusing on all I've lost".  Target Date: 12/01/23  Progress: progressing    Short-term goal:  Write at  least one positive affirmation about  herself and/or patient's environment each day Target Date: 12/01/23  Progress: progressing    Practice gratitude exercises to decrease symptoms of depression Target Date: 12/01/23  Progress: progressing    Identify, challenge, and replace negative thought patterns that contribute to feelings of depression and anxiety with positive thoughts and beliefs per patient's report Target Date: 12/01/23  Progress: progressing    Develop and implement strategies to increase self care, such as, participating in water exercise class, accepting invitations to social functions   Target Date: 12/01/23  Progress: progressing    verbalize positive statements regarding self and her ability to cope with life stressors     Target Date: 12/01/23  Progress: progressing       Burlene Carpen, LCSW

## 2023-12-01 ENCOUNTER — Ambulatory Visit (INDEPENDENT_AMBULATORY_CARE_PROVIDER_SITE_OTHER): Admitting: Clinical

## 2023-12-01 DIAGNOSIS — F419 Anxiety disorder, unspecified: Secondary | ICD-10-CM | POA: Diagnosis not present

## 2023-12-01 DIAGNOSIS — F330 Major depressive disorder, recurrent, mild: Secondary | ICD-10-CM | POA: Diagnosis not present

## 2023-12-01 NOTE — Progress Notes (Signed)
 [facility] Counselor/Therapist Progress Note  Patient ID: MAKEYLA GOVAN, MRN: [id number],    Date: 12/01/2023  Time Spent: 9:30am - 10:21am : 51 minutes   Treatment Type: Individual Therapy  Reported Symptoms: crying recently, fluctuations in mood  Mental Status Exam: Appearance:  Neat and Well Groomed     Behavior: Appropriate  Motor: Normal  Speech/Language:  Clear and Coherent and Normal Rate  Affect: Appropriate  Mood: normal  Thought process: normal  Thought content:   WNL  Sensory/Perceptual disturbances:   WNL  Orientation: oriented to person, place, time/date, and situation  Attention: Good  Concentration: Good  Memory: WNL  Fund of knowledge:  Good  Insight:   Good  Judgment:  Good  Impulse Control: Good   Risk Assessment: Danger to Self:  No Patient denied current suicidal ideation  Self-injurious Behavior: No Danger to Others: No Patient denied current homicidal ideation Duty to Warn:no Physical Aggression / Violence:No  Access to Firearms a concern: No  Gang Involvement:No   Subjective: Patient stated, "I've been up and down, I've been a roller coaster" in response to mood since last session. Patient reported she has been going for walks daily and reported crying during patient's morning walks. Patient reported feeling lonely. Patient reported when patient lived in an apartment complex patient was able to socialize with neighbors. Patient reported patient's current neighbors do not socialize with others. Patient reported she has reconnected with a friend by phone and stated, "that was a bright spot". Patient reported she has been reaching out to family members through social media. Patient stated, "seems like things are better", "I'm not angry" in response to patient's mood since last session. Patient reported she has not received a response from [employer] in response to patient's application. Patient reported she plans to reach out to  patient's insurance provider to inquire about transportation coverage. Patient reported transportation is barrier to socialization.    Interventions: Cognitive Behavioral Therapy. Clinician conducted session via caregility video from clinician's home office. Patient provided verbal consent to proceed with telehealth session and is aware of limitations of telephone or video visits. Patient participated in session from patient's home. Reviewed events since last session and assessed for changes. Discussed triggers for feelings of sadness. Reviewed transportation resources and status of patient's outreach. Explored additional resources for transportation. Explored opportunities for socialization, such as, [facility] scientific [employer] [facility] activities. Discussed barriers to socialization. Provided patient with additional resources for support [employer] on [employer]). Clinician requested for homework patient follow up with local transportation resources.    Collaboration of Care: not required at this time   Diagnosis:  Mild episode of recurrent major depressive disorder (HCC)   Anxiety disorder, unspecified type     Plan: Patient is to utilize [employer], thought re-framing, mindfulness and coping strategies to decrease symptoms associated with their diagnosis. Frequency: bi-weekly  Modality: individual      Long-term goal:   Patient stated, "I guess I want to see my being able to recognize and appreciate what I have instead of focusing on all I've lost".  Target Date: 12/01/23  Progress: progressing    Short-term goal:  Write at least one positive affirmation about herself and/or patient's environment each day Target Date: 12/01/23  Progress: progressing    Practice gratitude exercises to decrease symptoms of depression Target Date: 12/01/23  Progress: progressing    Identify, challenge, and replace negative thought patterns that contribute to feelings of depression and  anxiety with  positive thoughts and beliefs per patient's report Target Date: 12/01/23  Progress: progressing    Develop and implement strategies to increase self care, such as, participating in water exercise class, accepting invitations to social functions   Target Date: 12/01/23  Progress: progressing    verbalize positive statements regarding self and her ability to cope with life stressors     Target Date: 12/01/23  Progress: progressing       Burlene Carpen, LCSW

## 2023-12-01 NOTE — Progress Notes (Signed)
   Doree Barthel, LCSW

## 2023-12-07 ENCOUNTER — Ambulatory Visit: Admitting: Psychiatry

## 2023-12-13 ENCOUNTER — Ambulatory Visit: Admitting: Clinical

## 2023-12-13 DIAGNOSIS — F419 Anxiety disorder, unspecified: Secondary | ICD-10-CM

## 2023-12-13 DIAGNOSIS — F330 Major depressive disorder, recurrent, mild: Secondary | ICD-10-CM

## 2023-12-13 NOTE — Progress Notes (Addendum)
 [facility] Counselor/Therapist Progress Note  Patient ID: Stacy Allen, MRN: [id number],    Date: 12/13/2023  Time Spent: 10:39am - 11:42am : 63 minutes   Treatment Type: Individual Therapy  Reported Symptoms: Patient reported sadness on Sunday  Mental Status Exam: Appearance:  Neat and Well Groomed     Behavior: Appropriate  Motor: Normal  Speech/Language:  Clear and Coherent and Normal Rate  Affect: Appropriate  Mood: normal  Thought process: normal  Thought content:   WNL  Sensory/Perceptual disturbances:   WNL  Orientation: oriented to person, place, time/date, and situation  Attention: Good  Concentration: Good  Memory: WNL  Fund of knowledge:  Good  Insight:   Good  Judgment:  Good  Impulse Control: Good   Risk Assessment: Danger to Self:  No Patient denied current suicidal ideation  Self-injurious Behavior: No Danger to Others: No Patient denied current homicidal ideation Duty to Warn:no Physical Aggression / Violence:No  Access to Firearms a concern: No  Gang Involvement:No   Subjective: Patient stated, up and down, things are better, a lot better really, still fighting the transportation. Patient reported she has reached out to a local transportation resource multiple times since last session and reported she has not received a response. Patient reported she has not contacted [employer] to inquire about transportation through [place]. Patient reported crying on Sunday due to patient not being able to see patient's brother. Patient reported she wants to see her brother and stated, its never convenient in reference to asking family members to coordinate transportation.  Patient reported patient plans to ask patient's nephew if nephew would schedule a time to provide transportation for patient to visit with her brother. Patient stated, that's the only thing really hurting me right now in reference to visiting with patient's brother. Patient stated, I  have accepted it and I do see, appreciate it, its still not home and I've accepted that in response to patient's long term goal. Patient stated, I haven't been good about that in response to patient's first short term goal and stated, I am making a home of where I am so I see a lot of positives. Patient stated, I'm really practicing but some times that other (negative thoughts) comes in in response patient's second short term goal. Patient stated, I'm not really good at the accepting the social functions yet, but I'm really coming along with the self care in response to patient's fourth short term goal. Patient stated, I have a lot of positive things I think about myself. Patient stated, I've made progress about in every one accept speaking positive about myself in reference to patient's goals.   Interventions: Cognitive Behavioral Therapy and Motivational Interviewing. Clinician conducted session in person at clinician's office at [facility]. Reviewed events since last session and assessed for changes. Discussed status of transportation resources. Discussed recent triggers for sadness and explored/identified thoughts associated with triggers. Discussed the option of asking family members to schedule a time to provide transportation for patient to visit with brother. Reviewed patient's goals and patient's progress.   Collaboration of Care: not required at this time   Diagnosis:  Mild episode of recurrent major depressive disorder (HCC)   Anxiety disorder, unspecified type     Plan: Patient is to utilize [employer], thought re-framing, mindfulness and coping strategies to decrease symptoms associated with their diagnosis. Frequency: bi-weekly  Modality: individual      Long-term goal:   Patient stated, I guess I want to  see my being able to recognize and appreciate what I have instead of focusing on all I've lost.  Target Date: 11/30/24  Progress: goal  has been met    Short-term goal:  Write at least one positive affirmation about herself and/or patient's environment each day Target Date: 11/30/24  Progress: progressing    Practice gratitude exercises to decrease symptoms of depression Target Date: 11/30/24  Progress: progressing    Identify, challenge, and replace negative thought patterns that contribute to feelings of depression and anxiety with positive thoughts and beliefs per patient's report Target Date: 11/30/24  Progress: progressing    Develop and implement strategies to increase self care, such as, participating in water exercise class, accepting invitations to social functions Target Date: 11/30/24  Progress: progressing    verbalize positive statements regarding self and her ability to cope with life stressors Target Date: 11/30/24  Progress: progressing     Burlene Carpen, LCSW

## 2023-12-13 NOTE — Progress Notes (Signed)
   Doree Barthel, LCSW

## 2023-12-18 ENCOUNTER — Ambulatory Visit: Payer: Self-pay | Admitting: Urology

## 2023-12-18 VITALS — BP 164/99 | HR 120 | Ht 63.0 in | Wt 206.2 lb

## 2023-12-18 DIAGNOSIS — N3020 Other chronic cystitis without hematuria: Secondary | ICD-10-CM | POA: Diagnosis not present

## 2023-12-18 LAB — URINALYSIS, COMPLETE
Bilirubin, UA: NEGATIVE
Glucose, UA: NEGATIVE
Ketones, UA: NEGATIVE
Nitrite, UA: NEGATIVE
Protein,UA: NEGATIVE
RBC, UA: NEGATIVE
Specific Gravity, UA: <1.005 — ABNORMAL LOW (ref 1.005–1.030)
Urobilinogen, Ur: 0.2 mg/dL (ref 0.2–1.0)
pH, UA: 6 (ref 5.0–7.5)

## 2023-12-18 LAB — MICROSCOPIC EXAMINATION: Epithelial Cells (non renal): >10 /HPF — AB (ref 0–10)

## 2023-12-18 MED ORDER — GEMTESA 75 MG PO TABS
75.0000 mg | ORAL_TABLET | Freq: Every day | ORAL | Status: AC
Start: 2023-12-18 — End: 2024-01-15

## 2023-12-18 MED ORDER — GEMTESA 75 MG PO TABS: 75.0000 mg | ORAL_TABLET | Freq: Every day | ORAL | 11 refills | Status: DC

## 2023-12-18 NOTE — Progress Notes (Signed)
 12/18/2023 9:02 AM   Rock JAYSON Cedar [birth year] [id number]  Referring provider: Osa Geralds, NP [address] [place],  [place] [address]  Chief Complaint  Patient presents with   Follow-up    HPI:  was consulted to assess the patient's urinary incontinence and infections.  She has urge incontinence.  No bedwetting.  I believe if she has to go too far she has foot on the floor syndrome.  She may rarely leak with sneezing.  She was not a strong historian.  She says she does not wear pads normally but wears a pull-up at night   It also appears that the patient was referred for microscopic hematuria   She can void every hour and then hold it for several hours.  She can get up 10 times at night.  Sometimes has ankle edema.  No diuretic.   She will have urgency and get a false signal and then have a poor flow   She thinks he gets a bladder infection every 2 months with burning urgency that respond favorably to antibiotics.  She said currently it hurts a little bit at the end of urination but the urine is otherwise clear   She has had low back surgery.  She has had a hysterectomy.   I reviewed urine culture from the last few years and 2 were negative and 1 was positive.  She had a normal CT scan June 2023.  It turns out she had a CT scan of the abdomen pelvis and chest in October when she was admitted and it was within normal limits   She no longer takes daily Eliquis .  She is not on aspirin .  She has a smoking history     Pathophysiology of UTIs discussed.  I would like to put her on trimethoprim  100 mg 30 x 11 and have her come back in 6 to 8 weeks for cystoscopy and pelvic examination.  Hopefully some of her urgency and frequency will also settle down.  She likely also has a nocturnal diuresis.  Workup for hematuria discussed.  Return on trimethoprim  for cystoscopy.     It will be interesting to some of her symptoms especially the nocturia down regulate some on the trimethoprim .    Today Patient is very pleased that she is clinically not having infections.  No discomfort.  Now gets up 3 times at night instead hourly. On pelvic examination she had mild prolapse and no stress incontinence with a minimal cystocele.  Cystoscopy normal-reassess 4 to 5 months on trimethoprim  suppression therapy.  Today Frequency stable.  Last culture negative. Clinically infection free.  She had some discomfort a month ago that went away with pushing fluids.  She is now getting up several times a night again.  Clinically not infected.   PMH: Past Medical History:  Diagnosis Date   Acute GI bleeding 04/18/2023   Adenomatous polyps    Allergic state    Anemia    Anginal pain (HCC)    Anticoagulated 04/18/2023   Anxiety    Arthritis    osteoarthritis   Benign neoplasm of colon 04/21/2023   Broken rib    left   C. difficile diarrhea 10/21/2021   Cancer (HCC)    skin   CHF (congestive heart failure) (HCC)    Chicken pox    Complication of anesthesia    respiratory distress after rectocele surgery   Coronary artery disease    Depression    Eczema    Fibromyalgia  Gastric polyp 04/19/2023   GERD (gastroesophageal reflux disease)    GI bleed 04/18/2023   Headache    migraines   Hemorrhoids    History of hiatal hernia    Hypercalcemia    Hyperlipidemia    Hypertension    Hypothyroidism    Lumbar spondylosis 06/01/2017   Lumbar stenosis    Melena 04/19/2023   Obesity    Peptic ulcer disease    Shortness of breath dyspnea    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 Kathi, [facility], Spinal Fusion   CARDIAC CATHETERIZATION N/A 04/03/2015   Procedure: Left Heart Cath and Coronary Angiography;  Surgeon: Vinie DELENA Jude, MD;  Location: [facility] INVASIVE CV  LAB;  Service: Cardiovascular;  Laterality: N/A;   CATARACT EXTRACTION W/PHACO Left 04/22/2019   Procedure: CATARACT EXTRACTION PHACO AND INTRAOCULAR LENS PLACEMENT (IOC) LEFT  00:45.4  12.9%  6.03;  Surgeon: Myrna Adine Anes, MD;  Location: [facility];  Service: Ophthalmology;  Laterality: Left;  Latex sleep apnea   CATARACT EXTRACTION W/PHACO Right 07/01/2019   Procedure: CATARACT EXTRACTION PHACO AND INTRAOCULAR LENS PLACEMENT (IOC) RIGHT 2.38  00:24.4;  Surgeon: Myrna Adine Anes, MD;  Location: [facility] SURGERY CNTR;  Service: Ophthalmology;  Laterality: Right;  Latex Sleep apnea   CHOLECYSTECTOMY     COLONOSCOPY WITH PROPOFOL  N/A 04/21/2023   Procedure: COLONOSCOPY WITH PROPOFOL ;  Surgeon: Leigh Elspeth SQUIBB, MD;  Location: [facility] ENDOSCOPY;  Service: Gastroenterology;  Laterality: N/A;   CORONARY ANGIOPLASTY  2015   Dr. Jude, [facility] Cath Lab   ESOPHAGOGASTRODUODENOSCOPY (EGD) WITH PROPOFOL  N/A 12/21/2016   Procedure: ESOPHAGOGASTRODUODENOSCOPY (EGD) WITH PROPOFOL ;  Surgeon: Viktoria Lamar DASEN, MD;  Location: [facility] ENDOSCOPY;  Service: Endoscopy;  Laterality: N/A;   ESOPHAGOGASTRODUODENOSCOPY (EGD) WITH PROPOFOL  N/A 12/08/2022   Procedure: ESOPHAGOGASTRODUODENOSCOPY (EGD) WITH PROPOFOL ;  Surgeon: Onita Elspeth Sharper, DO;  Location: [facility] ENDOSCOPY;  Service: Gastroenterology;  Laterality: N/A;   ESOPHAGOGASTRODUODENOSCOPY (EGD) WITH PROPOFOL  N/A 04/19/2023   Procedure: ESOPHAGOGASTRODUODENOSCOPY (EGD) WITH PROPOFOL ;  Surgeon: Leigh Elspeth SQUIBB, MD;  Location: [facility] ENDOSCOPY;  Service: Gastroenterology;  Laterality: N/A;   EYE SURGERY     FRACTURE SURGERY Left 1991   Fractures Femur, [facility]   GIVENS CAPSULE STUDY N/A 06/22/2023   Procedure: GIVENS CAPSULE STUDY;  Surgeon: Jinny Carmine, MD;  Location: [facility] ENDOSCOPY;  Service: Endoscopy;  Laterality: N/A;   HEMOSTASIS CLIP PLACEMENT  04/19/2023   Procedure: HEMOSTASIS CLIP PLACEMENT;  Surgeon: Leigh Elspeth SQUIBB, MD;   Location: [facility] ENDOSCOPY;  Service: Gastroenterology;;   HIP ARTHROPLASTY Right 05/07/2020   Procedure: ARTHROPLASTY RIGHT HIP (HEMIARTHROPLASTY);  Surgeon: Marchia Drivers, MD;  Location: [facility] ORS;  Service: Orthopedics;  Laterality: Right;   IMAGE GUIDED SINUS SURGERY Bilateral 09/28/2015   Procedure: IMAGE GUIDED SINUS SURGERY, SEPTOPLASTY, BILATERAL FRONTAL SINUSOTOMIES, BILATERAL MAXILLARY ANTROSTOMIES, BILATERAL TOTAL ETHMOIDECTOMY, BILATERAL SPHENOIDECTOMY, BILATERAL INFERIOR TURBINATE REDUCTION;  Surgeon: Deward Argue, MD;  Location: [facility] ORS;  Service: ENT;  Laterality: Bilateral;   LEFT HEART CATH AND CORONARY ANGIOGRAPHY Left 01/19/2023   Procedure: LEFT HEART CATH AND CORONARY ANGIOGRAPHY;  Surgeon: Ammon Blunt, MD;  Location: [facility] INVASIVE CV LAB;  Service: Cardiovascular;  Laterality: Left;   POLYPECTOMY  04/19/2023   Procedure: POLYPECTOMY;  Surgeon: Leigh,  Elspeth SQUIBB, MD;  Location: [facility] ENDOSCOPY;  Service: Gastroenterology;;   POLYPECTOMY  04/21/2023   Procedure: POLYPECTOMY;  Surgeon: Leigh Elspeth SQUIBB, MD;  Location: [facility] ENDOSCOPY;  Service: Gastroenterology;;   RECTOCELE REPAIR      Home Medications:  Allergies as of 12/18/2023       Reactions   Azithromycin Diarrhea, Rash   fainting Other reaction(s): Syncope  fainting    Other reaction(s): NOT KNOWN  fainting   Meloxicam Diarrhea   Morphine  Other (See Comments), Rash   Other reaction(s): Unknown Retain urine Other reaction(s): NOT KNOWN, Other (See Comments) Retain urine Other reaction(s): side effect of severe rash   Adhesive [tape]    blisters   Codeine Nausea And Vomiting   Lactose Intolerance (gi) Other (See Comments)   stomach pain   Red Dye #40 (allura Red) Itching   red food dye   Shellfish-derived Products Itching, Swelling   Aspirin  Rash   Latex Rash   With extended contact   Lodine [etodolac] Rash   Morphine  And Codeine Hives, Nausea And Vomiting   Other reaction(s):  UNKNOWN   Penicillins Rash, Hives   Given on 05/07/20.  No reaction noted.   Pravastatin Rash        Medication List        Accurate as of December 18, 2023  9:02 AM. If you have any questions, ask your nurse or doctor.          acetaminophen  500 MG tablet Commonly known as: TYLENOL  Take 500 mg by mouth every 6 (six) hours as needed.   ADVAIR HFA IN Inhale 1 puff into the lungs daily as needed.   albuterol  108 (90 Base) MCG/ACT inhaler Commonly known as: VENTOLIN  HFA Inhale 2 puffs into the lungs every 6 (six) hours as needed for wheezing or shortness of breath.   amLODipine  5 MG tablet Commonly known as: NORVASC  Take 1 tablet (5 mg total) by mouth daily. for blood pressure.   carboxymethylcellulose 0.5 % Soln Commonly known as: REFRESH PLUS Place 1 drop into both eyes daily as needed (dry eyes).   clonazePAM  1 MG tablet Commonly known as: KLONOPIN  Take 1 mg by mouth at bedtime.   escitalopram  10 MG tablet Commonly known as: Lexapro  Take 1 tablet (10 mg total) by mouth daily. for anxiety and depression.   fluticasone  50 MCG/ACT nasal spray Commonly known as: FLONASE  Place into both nostrils.   gabapentin  600 MG tablet Commonly known as: NEURONTIN  Take 600-1,200 mg by mouth See admin instructions. Take 1 tablet (600mg ) by mouth in the morning and 2 tablets (1200mg ) by mouth at bedtime   hydrOXYzine  10 MG tablet Commonly known as: ATARAX  Take 1-2 tablets (10-20 mg total) by mouth at bedtime as needed for anxiety.   levothyroxine  125 MCG tablet Commonly known as: SYNTHROID  Take 1 tablet by mouth every morning on an empty stomach with water only.  No food or other medications for 30 minutes.   omeprazole  40 MG capsule Commonly known as: PRILOSEC Take 1 capsule (40 mg total) by mouth 2 (two) times daily before a meal.   ondansetron  4 MG disintegrating tablet Commonly known as: ZOFRAN -ODT Take 1 tablet (4 mg total) by mouth every 8 (eight) hours as needed for  nausea or vomiting.   tiZANidine  4 MG tablet Commonly known as: ZANAFLEX  Take 1 tablet (4 mg total) by mouth every 8 (eight) hours as needed for muscle spasms.   trimethoprim  100 MG tablet Commonly known as: TRIMPEX  Take 1  tablet (100 mg total) by mouth daily.        Allergies:  Allergies  Allergen Reactions   Azithromycin Diarrhea and Rash    fainting  Other reaction(s): Syncope  fainting    Other reaction(s): NOT KNOWN  fainting   Meloxicam Diarrhea   Morphine  Other (See Comments) and Rash    Other reaction(s): Unknown Retain urine Other reaction(s): NOT KNOWN, Other (See Comments) Retain urine Other reaction(s): side effect of severe rash   Adhesive [Tape]     blisters   Codeine Nausea And Vomiting   Lactose Intolerance (Gi) Other (See Comments)    stomach pain   Red Dye #40 (Allura Red) Itching    red food dye   Shellfish-Derived Products Itching and Swelling   Aspirin  Rash   Latex Rash    With extended contact   Lodine [Etodolac] Rash   Morphine  And Codeine Hives and Nausea And Vomiting    Other reaction(s): UNKNOWN   Penicillins Rash and Hives    Given on 05/07/20.  No reaction noted.   Pravastatin Rash    Family History: Family History  Problem Relation Age of Onset   Hypertension Mother    Cancer Mother    Diabetes Father    Hypertension Father    Heart failure Father    Prostate cancer Brother    Breast cancer Neg Hx     Social History:  reports that she quit smoking about 25 years ago. Her smoking use included cigarettes. She has been exposed to tobacco smoke. She has never used smokeless tobacco. She reports that she does not drink alcohol  and does not use drugs.  ROS:                                        Physical Exam: There were no vitals taken for this visit.  Constitutional:  Alert and oriented, No acute distress. HEENT: [place] AT, moist mucus membranes.  Trachea midline, no masses.   Laboratory  Data: Lab Results  Component Value Date   WBC 7.1 10/21/2023   HGB 13.8 10/21/2023   HCT 42.3 10/21/2023   MCV 88.3 10/21/2023   PLT 269 10/21/2023    Lab Results  Component Value Date   CREATININE 0.81 10/21/2023    No results found for: PSA  No results found for: TESTOSTERONE  Lab Results  Component Value Date   HGBA1C 5.0 08/09/2023    Urinalysis    Component Value Date/Time   COLORURINE STRAW (A) 04/17/2023 1808   APPEARANCEUR Clear 07/24/2023 0952   LABSPEC 1.010 04/17/2023 1808   LABSPEC 1.005 07/23/2014 1444   PHURINE 7.0 04/17/2023 1808   GLUCOSEU Negative 07/24/2023 0952   GLUCOSEU NEGATIVE 07/23/2014 1444   HGBUR NEGATIVE 04/17/2023 1808   BILIRUBINUR Negative 07/24/2023 0952   BILIRUBINUR NEGATIVE 07/23/2014 1444   KETONESUR NEGATIVE 04/17/2023 1808   PROTEINUR Negative 07/24/2023 0952   PROTEINUR NEGATIVE 04/17/2023 1808   NITRITE Negative 07/24/2023 0952   NITRITE NEGATIVE 04/17/2023 1808   LEUKOCYTESUR 1+ (A) 07/24/2023 0952   LEUKOCYTESUR NEGATIVE 04/17/2023 1808   LEUKOCYTESUR NEGATIVE 07/23/2014 1444    Pertinent Imaging: Urine reviewed and sent for culture  Assessment & Plan: Pathophysiology of nocturia discussed.  She does have some ankle edema.  I think we need to have reasonable treatment goals.  Depending on her goals percutaneous tibial nerve stimulation may also be helpful.  Stay on trimethoprim .  Call if culture positive.  Reassess on Gemtesa samples and prescription for 6 weeks.  Polypharmacy discussed.  Reassess in 6 weeks on Gemtesa.    1. Chronic cystitis (Primary)  - Urinalysis, Complete   No follow-ups on file.  Glendia DELENA Elizabeth, MD  [facility] [address], [address] [place], [place] [address] [phone]

## 2023-12-19 ENCOUNTER — Other Ambulatory Visit (HOSPITAL_COMMUNITY): Payer: Self-pay

## 2023-12-21 LAB — CULTURE, URINE COMPREHENSIVE

## 2023-12-22 ENCOUNTER — Inpatient Hospital Stay: Attending: Oncology

## 2023-12-22 DIAGNOSIS — D509 Iron deficiency anemia, unspecified: Secondary | ICD-10-CM | POA: Insufficient documentation

## 2023-12-22 DIAGNOSIS — D508 Other iron deficiency anemias: Secondary | ICD-10-CM

## 2023-12-22 LAB — CBC WITH DIFFERENTIAL (CANCER CENTER ONLY)
Abs Immature Granulocytes: 0.03 10*3/uL (ref 0.00–0.07)
Basophils Absolute: 0.1 10*3/uL (ref 0.0–0.1)
Basophils Relative: 1 %
Eosinophils Absolute: 0.1 10*3/uL (ref 0.0–0.5)
Eosinophils Relative: 1 %
HCT: 39.9 % (ref 36.0–46.0)
Hemoglobin: 13 g/dL (ref 12.0–15.0)
Immature Granulocytes: 0 %
Lymphocytes Relative: 17 %
Lymphs Abs: 1.3 10*3/uL (ref 0.7–4.0)
MCH: 30 pg (ref 26.0–34.0)
MCHC: 32.6 g/dL (ref 30.0–36.0)
MCV: 91.9 fL (ref 80.0–100.0)
Monocytes Absolute: 0.7 10*3/uL (ref 0.1–1.0)
Monocytes Relative: 9 %
Neutro Abs: 5.5 10*3/uL (ref 1.7–7.7)
Neutrophils Relative %: 72 %
Platelet Count: 288 10*3/uL (ref 150–400)
RBC: 4.34 MIL/uL (ref 3.87–5.11)
RDW: 15 % (ref 11.5–15.5)
WBC Count: 7.6 10*3/uL (ref 4.0–10.5)
nRBC: 0 % (ref 0.0–0.2)

## 2023-12-22 LAB — IRON AND TIBC
Iron: 86 ug/dL (ref 28–170)
Saturation Ratios: 24 % (ref 10.4–31.8)
TIBC: 357 ug/dL (ref 250–450)
UIBC: 271 ug/dL

## 2023-12-22 LAB — FERRITIN: Ferritin: 94 ng/mL (ref 11–307)

## 2024-01-01 ENCOUNTER — Other Ambulatory Visit: Payer: Self-pay | Admitting: Primary Care

## 2024-01-01 DIAGNOSIS — F411 Generalized anxiety disorder: Secondary | ICD-10-CM

## 2024-01-01 NOTE — Telephone Encounter (Signed)
 Noted, will decline refills and remove myself as PCP.

## 2024-01-01 NOTE — Telephone Encounter (Signed)
 Patient is no longer taking Lexapro . She has restablished care with previous PCP

## 2024-01-01 NOTE — Telephone Encounter (Signed)
 Please call patient:  Received refill requests from her pharmacy for the propranolol  and Lexapro  anxiety medications.   Is she taking these for anxiety? Did she re-establish care with her prior PCP?

## 2024-01-04 ENCOUNTER — Ambulatory Visit: Admitting: Clinical

## 2024-01-04 DIAGNOSIS — F419 Anxiety disorder, unspecified: Secondary | ICD-10-CM | POA: Diagnosis not present

## 2024-01-04 DIAGNOSIS — F330 Major depressive disorder, recurrent, mild: Secondary | ICD-10-CM
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
## 2024-01-04 NOTE — Progress Notes (Signed)
   Stacy Barthel, LCSW

## 2024-01-15 DIAGNOSIS — M25551 Pain in right hip: Secondary | ICD-10-CM | POA: Diagnosis not present

## 2024-01-15 DIAGNOSIS — J4540 Moderate persistent asthma, uncomplicated: Secondary | ICD-10-CM | POA: Diagnosis not present

## 2024-01-15 DIAGNOSIS — E669 Obesity, unspecified: Secondary | ICD-10-CM | POA: Diagnosis not present

## 2024-01-15 DIAGNOSIS — M25552 Pain in left hip: Secondary | ICD-10-CM | POA: Diagnosis not present

## 2024-01-15 DIAGNOSIS — G4733 Obstructive sleep apnea (adult) (pediatric): Secondary | ICD-10-CM | POA: Diagnosis not present

## 2024-01-23 ENCOUNTER — Encounter: Payer: Self-pay | Admitting: Urology

## 2024-01-23 DIAGNOSIS — E871 Hypo-osmolality and hyponatremia: Secondary | ICD-10-CM | POA: Diagnosis not present

## 2024-01-23 DIAGNOSIS — E039 Hypothyroidism, unspecified: Secondary | ICD-10-CM | POA: Diagnosis not present

## 2024-01-23 DIAGNOSIS — Z0130 Encounter for examination of blood pressure without abnormal findings: Secondary | ICD-10-CM | POA: Diagnosis not present

## 2024-01-23 DIAGNOSIS — Z0131 Encounter for examination of blood pressure with abnormal findings: Secondary | ICD-10-CM | POA: Diagnosis not present

## 2024-01-23 DIAGNOSIS — F330 Major depressive disorder, recurrent, mild: Secondary | ICD-10-CM | POA: Diagnosis not present

## 2024-01-23 DIAGNOSIS — Z1331 Encounter for screening for depression: Secondary | ICD-10-CM | POA: Diagnosis not present

## 2024-01-23 DIAGNOSIS — E2740 Unspecified adrenocortical insufficiency: Secondary | ICD-10-CM | POA: Diagnosis not present

## 2024-01-23 DIAGNOSIS — Z0000 Encounter for general adult medical examination without abnormal findings: Secondary | ICD-10-CM | POA: Diagnosis not present

## 2024-01-23 DIAGNOSIS — E785 Hyperlipidemia, unspecified: Secondary | ICD-10-CM | POA: Diagnosis not present

## 2024-01-23 DIAGNOSIS — Z1389 Encounter for screening for other disorder: Secondary | ICD-10-CM | POA: Diagnosis not present

## 2024-01-23 DIAGNOSIS — I10 Essential (primary) hypertension: Secondary | ICD-10-CM | POA: Diagnosis not present

## 2024-01-26 ENCOUNTER — Ambulatory Visit (INDEPENDENT_AMBULATORY_CARE_PROVIDER_SITE_OTHER): Payer: Medicare HMO | Admitting: Nurse Practitioner

## 2024-01-30 ENCOUNTER — Encounter (INDEPENDENT_AMBULATORY_CARE_PROVIDER_SITE_OTHER): Payer: Self-pay | Admitting: Vascular Surgery

## 2024-01-30 ENCOUNTER — Ambulatory Visit (INDEPENDENT_AMBULATORY_CARE_PROVIDER_SITE_OTHER): Admitting: Vascular Surgery

## 2024-01-30 VITALS — BP 125/81 | HR 74 | Resp 18 | Wt 205.6 lb

## 2024-01-30 DIAGNOSIS — Z8719 Personal history of other diseases of the digestive system: Secondary | ICD-10-CM

## 2024-01-30 DIAGNOSIS — I10 Essential (primary) hypertension: Secondary | ICD-10-CM | POA: Diagnosis not present

## 2024-01-30 DIAGNOSIS — Z86718 Personal history of other venous thrombosis and embolism: Secondary | ICD-10-CM | POA: Diagnosis not present

## 2024-01-30 MED ORDER — ROPINIROLE HCL 1 MG PO TABS: 1.0000 mg | ORAL_TABLET | Freq: Three times a day (TID) | ORAL | 0 refills | Status: DC

## 2024-01-30 NOTE — Assessment & Plan Note (Signed)
 blood pressure control important in reducing the progression of atherosclerotic disease. On appropriate oral medications.

## 2024-01-30 NOTE — Assessment & Plan Note (Signed)
 Has remained off of anticoagulation with no further bleeding.

## 2024-01-30 NOTE — Progress Notes (Signed)
 MRN : [id number]  Stacy Allen is a 83 y.o. ([birth year]) female who presents with chief complaint of  Chief Complaint  Patient presents with   Follow-up    6 month follow up  .  History of Present Illness: Patient returns today in follow up of history of DVT and superficial thrombophlebitis.  She was last seen about 6 months ago.  Her anticoagulation had to be stopped for GI bleed.  Since that time, she has more issues with restless leg syndrome and twitching in tingling in her legs than she has with swelling or varicose veins.  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  No open wounds.  No chest pain or shortness of breath.  Current Outpatient Medications  Medication Sig Dispense Refill   acetaminophen  (TYLENOL ) 500 MG tablet Take 500 mg by mouth every 6 (six) hours as needed.     albuterol  (PROVENTIL  HFA;VENTOLIN  HFA) 108 (90 BASE) MCG/ACT inhaler Inhale 2 puffs into the lungs every 6 (six) hours as needed for wheezing or shortness of breath.     amLODipine  (NORVASC ) 5 MG tablet Take 1 tablet (5 mg total) by mouth daily. for blood pressure. 30 tablet 0   carboxymethylcellulose (REFRESH PLUS) 0.5 % SOLN Place 1 drop into both eyes daily as needed (dry eyes).     clonazePAM  (KLONOPIN ) 1 MG tablet Take 1 mg by mouth at bedtime.     escitalopram  (LEXAPRO ) 10 MG tablet Take 1 tablet (10 mg total) by mouth daily. for anxiety and depression. 90 tablet 0   fluticasone  (FLONASE ) 50 MCG/ACT nasal spray Place into both nostrils.     Fluticasone -Salmeterol (ADVAIR HFA IN) Inhale 1 puff into the lungs daily as needed.     gabapentin  (NEURONTIN ) 600 MG tablet Take 600-1,200 mg by mouth See admin instructions. Take 1 tablet (600mg ) by mouth in the morning and 2 tablets (1200mg ) by mouth at bedtime     hydrOXYzine  (ATARAX ) 10 MG tablet Take 1-2 tablets (10-20 mg total) by mouth at bedtime as needed for anxiety. 90 tablet 0    levothyroxine  (SYNTHROID ) 125 MCG tablet Take 1 tablet by mouth every morning on an empty stomach with water only.  No food or other medications for 30 minutes. 90 tablet 2   omeprazole  (PRILOSEC) 40 MG capsule Take 1 capsule (40 mg total) by mouth 2 (two) times daily before a meal. 60 capsule 1   ondansetron  (ZOFRAN -ODT) 4 MG disintegrating tablet Take 1 tablet (4 mg total) by mouth every 8 (eight) hours as needed for nausea or vomiting. 20 tablet 0   rOPINIRole  (REQUIP ) 1 MG tablet Take 1 tablet (1 mg total) by mouth 3 (three) times daily. 90 tablet 0   tiZANidine  (ZANAFLEX ) 4 MG tablet Take 1 tablet (4 mg total) by mouth every 8 (eight) hours as needed for muscle spasms. 30 tablet 0   trimethoprim  (TRIMPEX ) 100 MG tablet Take 1 tablet (100 mg total) by mouth daily. 30 tablet 11   Vibegron  (GEMTESA ) 75 MG TABS Take 1 tablet (75 mg total) by mouth daily. 30 tablet 11   No current facility-administered medications for this visit.    Past Medical History:  Diagnosis Date   Acute GI bleeding 04/18/2023   Adenomatous polyps    Allergic state    Anemia    Anginal pain (HCC)    Anticoagulated 04/18/2023   Anxiety    Arthritis  osteoarthritis   Benign neoplasm of colon 04/21/2023   Broken rib    left   C. difficile diarrhea 10/21/2021   Cancer (HCC)    skin   CHF (congestive heart failure) (HCC)    Chicken pox    Complication of anesthesia    respiratory distress after rectocele surgery   Coronary artery disease    Depression    Eczema    Fibromyalgia    Gastric polyp 04/19/2023   GERD (gastroesophageal reflux disease)    GI bleed 04/18/2023   Headache    migraines   Hemorrhoids    History of hiatal hernia    Hypercalcemia    Hyperlipidemia    Hypertension    Hypothyroidism    Lumbar spondylosis 06/01/2017   Lumbar stenosis    Melena 04/19/2023   Obesity    Peptic ulcer disease    Shortness of breath dyspnea    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 Kathi, [facility], Spinal Fusion   CARDIAC CATHETERIZATION N/A 04/03/2015   Procedure: Left Heart Cath and Coronary Angiography;  Surgeon: Vinie DELENA Jude, MD;  Location: [facility] INVASIVE CV LAB;  Service: Cardiovascular;  Laterality: N/A;   CATARACT EXTRACTION W/PHACO Left 04/22/2019   Procedure: CATARACT EXTRACTION PHACO AND INTRAOCULAR LENS PLACEMENT (IOC) LEFT  00:45.4  12.9%  6.03;  Surgeon: Myrna Adine Anes, MD;  Location: [facility];  Service: Ophthalmology;  Laterality: Left;  Latex sleep apnea   CATARACT EXTRACTION W/PHACO Right 07/01/2019   Procedure: CATARACT EXTRACTION PHACO AND INTRAOCULAR LENS PLACEMENT (IOC) RIGHT 2.38  00:24.4;  Surgeon: Myrna Adine Anes, MD;  Location: [facility];  Service: Ophthalmology;  Laterality: Right;  Latex Sleep apnea   CHOLECYSTECTOMY     COLONOSCOPY WITH PROPOFOL  N/A 04/21/2023   Procedure: COLONOSCOPY WITH PROPOFOL ;  Surgeon: Leigh Elspeth SQUIBB, MD;  Location: [facility] ENDOSCOPY;  Service: Gastroenterology;  Laterality: N/A;   CORONARY ANGIOPLASTY  2015   Dr. Jude, [facility] Cath Lab   ESOPHAGOGASTRODUODENOSCOPY (EGD) WITH PROPOFOL  N/A 12/21/2016   Procedure: ESOPHAGOGASTRODUODENOSCOPY (EGD) WITH PROPOFOL ;  Surgeon: Viktoria Lamar DASEN, MD;  Location: [facility] ENDOSCOPY;  Service: Endoscopy;  Laterality: N/A;   ESOPHAGOGASTRODUODENOSCOPY (EGD) WITH PROPOFOL  N/A 12/08/2022   Procedure: ESOPHAGOGASTRODUODENOSCOPY (EGD) WITH PROPOFOL ;  Surgeon: Onita Elspeth Sharper, DO;  Location: [facility] ENDOSCOPY;  Service: Gastroenterology;  Laterality: N/A;   ESOPHAGOGASTRODUODENOSCOPY (EGD) WITH PROPOFOL  N/A 04/19/2023   Procedure: ESOPHAGOGASTRODUODENOSCOPY (EGD) WITH PROPOFOL ;  Surgeon: Leigh Elspeth SQUIBB, MD;  Location: [facility] ENDOSCOPY;  Service: Gastroenterology;  Laterality: N/A;    EYE SURGERY     FRACTURE SURGERY Left 1991   Fractures Femur, [facility]   GIVENS CAPSULE STUDY N/A 06/22/2023   Procedure: GIVENS CAPSULE STUDY;  Surgeon: Jinny Carmine, MD;  Location: [facility] ENDOSCOPY;  Service: Endoscopy;  Laterality: N/A;   HEMOSTASIS CLIP PLACEMENT  04/19/2023   Procedure: HEMOSTASIS CLIP PLACEMENT;  Surgeon: Leigh Elspeth SQUIBB, MD;  Location: [facility] ENDOSCOPY;  Service: Gastroenterology;;   HIP ARTHROPLASTY Right 05/07/2020   Procedure: ARTHROPLASTY RIGHT HIP (HEMIARTHROPLASTY);  Surgeon: Marchia Drivers, MD;  Location: [facility] ORS;  Service: Orthopedics;  Laterality: Right;   IMAGE GUIDED SINUS SURGERY Bilateral 09/28/2015   Procedure: IMAGE GUIDED SINUS SURGERY, SEPTOPLASTY, BILATERAL FRONTAL SINUSOTOMIES, BILATERAL  MAXILLARY ANTROSTOMIES, BILATERAL TOTAL ETHMOIDECTOMY, BILATERAL SPHENOIDECTOMY, BILATERAL INFERIOR TURBINATE REDUCTION;  Surgeon: Deward Argue, MD;  Location: [facility] ORS;  Service: ENT;  Laterality: Bilateral;   LEFT HEART CATH AND CORONARY ANGIOGRAPHY Left 01/19/2023   Procedure: LEFT HEART CATH AND CORONARY ANGIOGRAPHY;  Surgeon: Ammon Blunt, MD;  Location: [facility] INVASIVE CV LAB;  Service: Cardiovascular;  Laterality: Left;   POLYPECTOMY  04/19/2023   Procedure: POLYPECTOMY;  Surgeon: Leigh Elspeth SQUIBB, MD;  Location: [facility] ENDOSCOPY;  Service: Gastroenterology;;   POLYPECTOMY  04/21/2023   Procedure: POLYPECTOMY;  Surgeon: Leigh Elspeth SQUIBB, MD;  Location: [facility] ENDOSCOPY;  Service: Gastroenterology;;   RECTOCELE REPAIR       Social History   Tobacco Use   Smoking status: Former    Current packs/day: 0.00    Types: Cigarettes    Quit date: 2000    Years since quitting: 25.6    Passive exposure: Past   Smokeless tobacco: Never  Vaping Use   Vaping status: Never Used  Substance Use Topics   Alcohol  use: No   Drug use: No      Family History  Problem Relation Age of Onset   Hypertension Mother    Cancer Mother    Diabetes Father     Hypertension Father    Heart failure Father    Prostate cancer Brother    Breast cancer Neg Hx      Allergies  Allergen Reactions   Azithromycin Diarrhea and Rash    fainting  Other reaction(s): Syncope  fainting    Other reaction(s): NOT KNOWN  fainting   Meloxicam Diarrhea   Morphine  Other (See Comments) and Rash    Other reaction(s): Unknown Retain urine Other reaction(s): NOT KNOWN, Other (See Comments) Retain urine Other reaction(s): side effect of severe rash   Adhesive [Tape]     blisters   Codeine Nausea And Vomiting   Lactose Intolerance (Gi) Other (See Comments)    stomach pain   Red Dye #40 (Allura Red) Itching    red food dye   Shellfish-Derived Products Itching and Swelling   Aspirin  Rash   Latex Rash    With extended contact   Lodine [Etodolac] Rash   Morphine  And Codeine Hives and Nausea And Vomiting    Other reaction(s): UNKNOWN   Penicillins Rash and Hives    Given on 05/07/20.  No reaction noted.   Pravastatin Rash     REVIEW OF SYSTEMS (Negative unless checked)  Constitutional: [] Weight loss  [] Fever  [] Chills Cardiac: [] Chest pain   [] Chest pressure   [] Palpitations   [] Shortness of breath when laying flat   [] Shortness of breath at rest   [] Shortness of breath with exertion. Vascular:  [] Pain in legs with walking   [] Pain in legs at rest   [] Pain in legs when laying flat   [] Claudication   [] Pain in feet when walking  [] Pain in feet at rest  [] Pain in feet when laying flat   [x] History of DVT   [x] Phlebitis   [x] Swelling in legs   [] Varicose veins   [] Non-healing ulcers Pulmonary:   [] Uses home oxygen   [] Productive cough   [] Hemoptysis   [] Wheeze  [] COPD   [] Asthma Neurologic:  [] Dizziness  [] Blackouts   [] Seizures   [] History of stroke   [] History of TIA  [] Aphasia   [] Temporary blindness   [] Dysphagia   [] Weakness or numbness in arms   [] Weakness or numbness in legs Musculoskeletal:  [x] Arthritis   [] Joint swelling   [x] Joint pain    []   Low back pain Hematologic:  [] Easy bruising  [] Easy bleeding   [] Hypercoagulable state   [] Anemic   Gastrointestinal:  [x] Blood in stool   [] Vomiting blood  [x] Gastroesophageal reflux/heartburn   [] Abdominal pain Genitourinary:  [] Chronic kidney disease   [] Difficult urination  [] Frequent urination  [] Burning with urination   [] Hematuria Skin:  [] Rashes   [] Ulcers   [] Wounds Psychological:  [x] History of anxiety   []  History of major depression.  Physical Examination  BP 125/81   Pulse 74   Resp 18   Wt 205 lb 9.6 oz (93.3 kg)   BMI 36.42 kg/m  Gen:  WD/WN, NAD Head: [place]/AT, No temporalis wasting. Ear/Nose/Throat: Hearing grossly intact, nares w/o erythema or drainage Eyes: Conjunctiva clear. Sclera non-icteric Neck: Supple.  Trachea midline Pulmonary:  Good air movement, no use of accessory muscles.  Cardiac: RRR, no JVD Vascular:  Vessel Right Left  Radial Palpable Palpable                          PT Palpable Palpable  DP Palpable Palpable   Gastrointestinal: soft, non-tender/non-distended. No guarding/reflex.  Musculoskeletal: M/S 5/5 throughout.  No deformity or atrophy.  Mild bilateral lower extremity edema. Neurologic: Sensation grossly intact in extremities.  Symmetrical.  Speech is fluent.  Psychiatric: Judgment intact, Mood & affect appropriate for pt's clinical situation. Dermatologic: No rashes or ulcers noted.  No cellulitis or open wounds.      Labs Recent Results (from the past 2160 hours)  Urinalysis, Complete     Status: Abnormal   Collection Time: 12/18/23  9:03 AM  Result Value Ref Range   Specific Gravity, UA <1.005 (L) 1.005 - 1.030   pH, UA 6.0 5.0 - 7.5   Color, UA Yellow Yellow   Appearance Ur Clear Clear   Leukocytes,UA Trace (A) Negative   Protein,UA Negative Negative/Trace   Glucose, UA Negative Negative   Ketones, UA Negative Negative   RBC, UA Negative Negative   Bilirubin, UA Negative Negative   Urobilinogen, Ur 0.2 0.2 - 1.0  mg/dL   Nitrite, UA Negative Negative   Microscopic Examination See below:     Comment: Microscopic was indicated and was performed.  Microscopic Examination     Status: Abnormal   Collection Time: 12/18/23  9:03 AM   Urine  Result Value Ref Range   WBC, UA 6-10 (A) 0 - 5 /hpf   RBC, Urine 0-2 0 - 2 /hpf   Epithelial Cells (non renal) >10 (A) 0 - 10 /hpf   Casts Present (A) None seen /lpf   Cast Type Hyaline casts N/A   Bacteria, UA Few None seen/Few  CULTURE, URINE COMPREHENSIVE     Status: None   Collection Time: 12/18/23  9:22 AM   Specimen: Urine   UR  Result Value Ref Range   Urine Culture, Comprehensive Final report    Organism ID, Bacteria Comment     Comment: Mixed urogenital flora 25,000-50,000 colony forming units per mL   Iron  and TIBC([facility])     Status: None   Collection Time: 12/22/23 12:59 PM  Result Value Ref Range   Iron  86 28 - 170 ug/dL   TIBC 642 749 - 549 ug/dL   Saturation Ratios 24 10.4 - 31.8 %   UIBC 271 ug/dL    Comment: Performed at [facility], [address], [place], [place] [address]  Ferritin     Status: None   Collection Time: 12/22/23 12:59 PM  Result Value Ref Range   Ferritin 94 11 - 307 ng/mL    Comment: Performed at [facility], [address] [place], [place] [address]  CBC with Differential (Cancer Center Only)     Status: None   Collection Time: 12/22/23 12:59 PM  Result Value Ref Range   WBC Count 7.6 4.0 - 10.5 K/uL   RBC 4.34 3.87 - 5.11 MIL/uL   Hemoglobin 13.0 12.0 - 15.0 g/dL   HCT 60.0 63.9 - 53.9 %   MCV 91.9 80.0 - 100.0 fL   MCH 30.0 26.0 - 34.0 pg   MCHC 32.6 30.0 - 36.0 g/dL   RDW 84.9 88.4 - 84.4 %   Platelet Count 288 150 - 400 K/uL   nRBC 0.0 0.0 - 0.2 %   Neutrophils Relative % 72 %   Neutro Abs 5.5 1.7 - 7.7 K/uL   Lymphocytes Relative 17 %   Lymphs Abs 1.3 0.7 - 4.0 K/uL   Monocytes Relative 9 %   Monocytes Absolute 0.7 0.1 - 1.0 K/uL   Eosinophils Relative 1 %    Eosinophils Absolute 0.1 0.0 - 0.5 K/uL   Basophils Relative 1 %   Basophils Absolute 0.1 0.0 - 0.1 K/uL   Immature Granulocytes 0 %   Abs Immature Granulocytes 0.03 0.00 - 0.07 K/uL    Comment: Performed at [facility], [address], [place], [place] [address]    Radiology No results found.  Assessment/Plan  Essential hypertension blood pressure control important in reducing the progression of atherosclerotic disease. On appropriate oral medications.   History of GI bleed Has remained off of anticoagulation with no further bleeding.  History of DVT (deep vein thrombosis) No evidence of recurrent DVT or superficial thrombophlebitis at this time.  Will remain off of anticoagulation due to her GI bleeding unless she has further symptoms.  If she develops a new clot and cannot be anticoagulated, IVC filter would be indicated in that situation but I would not place one prophylactically.  She has asked for a prescription for her restless leg so I have given her a prescription for Requip  1 time and asked her to discuss with her primary care provider if this is to be ongoing.  I will see her back as needed.    Selinda Gu, MD  01/30/2024 5:05 PM    This note was created with Dragon medical transcription system.  Any errors from dictation are purely unintentional

## 2024-01-30 NOTE — Assessment & Plan Note (Signed)
 No evidence of recurrent DVT or superficial thrombophlebitis at this time.  Will remain off of anticoagulation due to her GI bleeding unless she has further symptoms.  If she develops a new clot and cannot be anticoagulated, IVC filter would be indicated in that situation but I would not place one prophylactically.  She has asked for a prescription for her restless leg so I have given her a prescription for Requip  1 time and asked her to discuss with her primary care provider if this is to be ongoing.  I will see her back as needed.

## 2024-02-05 ENCOUNTER — Ambulatory Visit: Admitting: Urology

## 2024-02-05 VITALS — BP 153/95 | HR 91 | Ht 60.0 in | Wt 201.0 lb

## 2024-02-05 DIAGNOSIS — N3281 Overactive bladder: Secondary | ICD-10-CM

## 2024-02-05 DIAGNOSIS — N3020 Other chronic cystitis without hematuria: Secondary | ICD-10-CM

## 2024-02-05 LAB — URINALYSIS, COMPLETE
Bilirubin, UA: NEGATIVE
Glucose, UA: NEGATIVE
Ketones, UA: NEGATIVE
Leukocytes,UA: NEGATIVE
Nitrite, UA: NEGATIVE
Protein,UA: NEGATIVE
Specific Gravity, UA: 1.025 (ref 1.005–1.030)
Urobilinogen, Ur: 0.2 mg/dL (ref 0.2–1.0)
pH, UA: 6 (ref 5.0–7.5)

## 2024-02-05 LAB — MICROSCOPIC EXAMINATION: Epithelial Cells (non renal): >10 /HPF — AB (ref 0–10)

## 2024-02-05 MED ORDER — OXYBUTYNIN CHLORIDE ER 10 MG PO TB24: 10.0000 mg | ORAL_TABLET | Freq: Every day | ORAL | 11 refills | Status: DC

## 2024-02-05 NOTE — Progress Notes (Signed)
 02/05/2024 1:56 PM   Stacy Allen [birth year] [id number]  Referring provider: No referring provider defined for this encounter.  Chief Complaint  Patient presents with   Follow-up   Medication Reaction    HPI: I was consulted to assess the patient's urinary incontinence and infections.  She has urge incontinence.  No bedwetting.  I believe if she has to go too far she has foot on the floor syndrome.  She may rarely leak with sneezing.  She was not a strong historian.  She says she does not wear pads normally but wears a pull-up at night   It also appears that the patient was referred for microscopic hematuria   She can void every hour and then hold it for several hours.  She can get up 10 times at night.  Sometimes has ankle edema.  No diuretic.   She will have urgency and get a false signal and then have a poor flow   She thinks he gets a bladder infection every 2 months with burning urgency that respond favorably to antibiotics.  She said currently it hurts a little bit at the end of urination but the urine is otherwise clear   She has had low back surgery.  She has had a hysterectomy.   I reviewed urine culture from the last few years and 2 were negative and 1 was positive.  She had a normal CT scan June 2023.  It turns out she had a CT scan of the abdomen pelvis and chest in October when she was admitted and it was within normal limits   She no longer takes daily Eliquis.  She is not on aspirin.  She has a smoking history     Pathophysiology of UTIs discussed.  I would like to put her on trimethoprim 100 mg 30 x 11 and have her come back in 6 to 8 weeks for cystoscopy and pelvic examination.  Hopefully some of her urgency and frequency will also settle down.  She likely also has a nocturnal diuresis.  Workup for hematuria discussed.  Return on trimethoprim for cystoscopy.     It will be interesting to some of her symptoms especially the nocturia down regulate some on the  trimethoprim.   Today Patient is very pleased that she is clinically not having infections.  No discomfort.  Now gets up 3 times at night instead hourly. On pelvic examination she had mild prolapse and no stress incontinence with a minimal cystocele.   Cystoscopy normal-reassess 4 to 5 months on trimethoprim suppression therapy.   Today Frequency stable.  Last culture negative. Clinically infection free.  She had some discomfort a month ago that went away with pushing fluids.  She is now getting up several times a night again.  Clinically not infected.  Pathophysiology of nocturia discussed.  She does have some ankle edema.  I think we need to have reasonable treatment goals.  Depending on her goals percutaneous tibial nerve stimulation may also be helpful.  Stay on trimethoprim.  Call if culture positive.  Reassess on Gemtesa samples and prescription for 6 weeks.  Polypharmacy discussed.  Reassess in 6 weeks on Gemtesa.    Today Frequency stable.  Last culture negative.  Patient now voiding 3 times a day but the prescription is $200.  Clinically not infected.  Very pleased but is hoping to have a less expensive drug   PMH: Past Medical History:  Diagnosis Date   Acute GI bleeding 04/18/2023  Adenomatous polyps    Allergic state    Anemia    Anginal pain (HCC)    Anticoagulated 04/18/2023   Anxiety    Arthritis    osteoarthritis   Benign neoplasm of colon 04/21/2023   Broken rib    left   C. difficile diarrhea 10/21/2021   Cancer (HCC)    skin   CHF (congestive heart failure) (HCC)    Chicken pox    Complication of anesthesia    respiratory distress after rectocele surgery   Coronary artery disease    Depression    Eczema    Fibromyalgia    Gastric polyp 04/19/2023   GERD (gastroesophageal reflux disease)    GI bleed 04/18/2023   Headache    migraines   Hemorrhoids    History of hiatal hernia    Hypercalcemia    Hyperlipidemia    Hypertension    Hypothyroidism     Lumbar spondylosis 06/01/2017   Lumbar stenosis    Melena 04/19/2023   Obesity    Peptic ulcer disease    Shortness of breath dyspnea    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 Kathi, [facility], Spinal Fusion   CARDIAC CATHETERIZATION N/A 04/03/2015   Procedure: Left Heart Cath and Coronary Angiography;  Surgeon: Vinie DELENA Jude, MD;  Location: [facility] INVASIVE CV LAB;  Service: Cardiovascular;  Laterality: N/A;   CATARACT EXTRACTION W/PHACO Left 04/22/2019   Procedure: CATARACT EXTRACTION PHACO AND INTRAOCULAR LENS PLACEMENT (IOC) LEFT  00:45.4  12.9%  6.03;  Surgeon: Myrna Adine Anes, MD;  Location: [facility];  Service: Ophthalmology;  Laterality: Left;  Latex sleep apnea   CATARACT EXTRACTION W/PHACO Right 07/01/2019   Procedure: CATARACT EXTRACTION PHACO AND INTRAOCULAR LENS PLACEMENT (IOC) RIGHT 2.38  00:24.4;  Surgeon: Myrna Adine Anes, MD;  Location: [facility] SURGERY [facility];  Service: Ophthalmology;  Laterality: Right;  Latex Sleep apnea   CHOLECYSTECTOMY     COLONOSCOPY WITH PROPOFOL N/A 04/21/2023   Procedure: COLONOSCOPY WITH PROPOFOL;  Surgeon: Leigh Elspeth SQUIBB, MD;  Location: [facility] ENDOSCOPY;  Service: Gastroenterology;  Laterality: N/A;   CORONARY ANGIOPLASTY  2015   Dr. Jude, [facility] Cath Lab   ESOPHAGOGASTRODUODENOSCOPY (EGD) WITH PROPOFOL N/A 12/21/2016   Procedure: ESOPHAGOGASTRODUODENOSCOPY (EGD) WITH PROPOFOL;  Surgeon: Viktoria Lamar DASEN, MD;  Location: [facility] ENDOSCOPY;  Service: Endoscopy;  Laterality: N/A;   ESOPHAGOGASTRODUODENOSCOPY (EGD) WITH PROPOFOL N/A 12/08/2022   Procedure: ESOPHAGOGASTRODUODENOSCOPY (EGD) WITH PROPOFOL;  Surgeon: Onita Elspeth Sharper, DO;  Location: [facility] ENDOSCOPY;  Service: Gastroenterology;  Laterality: N/A;    ESOPHAGOGASTRODUODENOSCOPY (EGD) WITH PROPOFOL N/A 04/19/2023   Procedure: ESOPHAGOGASTRODUODENOSCOPY (EGD) WITH PROPOFOL;  Surgeon: Leigh Elspeth SQUIBB, MD;  Location: [facility] ENDOSCOPY;  Service: Gastroenterology;  Laterality: N/A;   EYE SURGERY     FRACTURE SURGERY Left 1991   Fractures Femur, [facility]   GIVENS CAPSULE STUDY N/A 06/22/2023   Procedure: GIVENS CAPSULE STUDY;  Surgeon: Jinny Carmine, MD;  Location: [facility] ENDOSCOPY;  Service: Endoscopy;  Laterality: N/A;   HEMOSTASIS CLIP PLACEMENT  04/19/2023   Procedure: HEMOSTASIS CLIP PLACEMENT;  Surgeon: Leigh Elspeth SQUIBB, MD;  Location: [facility] ENDOSCOPY;  Service: Gastroenterology;;   HIP ARTHROPLASTY Right 05/07/2020   Procedure: ARTHROPLASTY RIGHT HIP (HEMIARTHROPLASTY);  Surgeon: Marchia Drivers, MD;  Location: [facility] ORS;  Service: Orthopedics;  Laterality: Right;   IMAGE GUIDED SINUS SURGERY Bilateral 09/28/2015   Procedure: IMAGE GUIDED SINUS SURGERY, SEPTOPLASTY, BILATERAL FRONTAL SINUSOTOMIES, BILATERAL MAXILLARY ANTROSTOMIES, BILATERAL TOTAL ETHMOIDECTOMY, BILATERAL SPHENOIDECTOMY, BILATERAL INFERIOR TURBINATE REDUCTION;  Surgeon: Deward Argue, MD;  Location: [facility] ORS;  Service: ENT;  Laterality: Bilateral;   LEFT HEART CATH AND CORONARY ANGIOGRAPHY Left 01/19/2023   Procedure: LEFT HEART CATH AND CORONARY ANGIOGRAPHY;  Surgeon: Ammon Blunt, MD;  Location: [facility] INVASIVE CV LAB;  Service: Cardiovascular;  Laterality: Left;   POLYPECTOMY  04/19/2023   Procedure: POLYPECTOMY;  Surgeon: Leigh Elspeth SQUIBB, MD;  Location: [facility] ENDOSCOPY;  Service: Gastroenterology;;   POLYPECTOMY  04/21/2023   Procedure: POLYPECTOMY;  Surgeon: Leigh Elspeth SQUIBB, MD;  Location: [facility] ENDOSCOPY;  Service: Gastroenterology;;   RECTOCELE REPAIR      Home Medications:  Allergies as of 02/05/2024       Reactions   Azithromycin Diarrhea, Rash   fainting Other reaction(s): Syncope  fainting    Other reaction(s): NOT KNOWN  fainting    Meloxicam Diarrhea   Morphine  Other (See Comments), Rash   Other reaction(s): Unknown Retain urine Other reaction(s): NOT KNOWN, Other (See Comments) Retain urine Other reaction(s): side effect of severe rash   Adhesive [tape]    blisters   Codeine Nausea And Vomiting   Lactose Intolerance (gi) Other (See Comments)   stomach pain   Red Dye #40 (allura Red) Itching   red food dye   Shellfish-derived Products Itching, Swelling   Aspirin  Rash   Latex Rash   With extended contact   Lodine [etodolac] Rash   Morphine  And Codeine Hives, Nausea And Vomiting   Other reaction(s): UNKNOWN   Penicillins Rash, Hives   Given on 05/07/20.  No reaction noted.   Pravastatin Rash        Medication List        Accurate as of February 05, 2024  1:56 PM. If you have any questions, ask your nurse or doctor.          acetaminophen  500 MG tablet Commonly known as: TYLENOL  Take 500 mg by mouth every 6 (six) hours as needed.   ADVAIR HFA IN Inhale 1 puff into the lungs daily as needed.   albuterol  108 (90 Base) MCG/ACT inhaler Commonly known as: VENTOLIN  HFA Inhale 2 puffs into the lungs every 6 (six) hours as needed for wheezing or shortness of breath.   amLODipine  5 MG tablet Commonly known as: NORVASC  Take 1 tablet (5 mg total) by mouth daily. for blood pressure.   carboxymethylcellulose 0.5 % Soln Commonly known as: REFRESH PLUS Place 1 drop into both eyes daily as needed (dry eyes).   clonazePAM  1 MG tablet Commonly known as: KLONOPIN  Take 1 mg by mouth at bedtime.   escitalopram  10 MG tablet Commonly known as: Lexapro  Take 1 tablet (10 mg total) by mouth daily. for anxiety and depression.   fluticasone  50 MCG/ACT nasal spray Commonly known as: FLONASE  Place into both nostrils.   gabapentin  600 MG tablet Commonly known as: NEURONTIN  Take 600-1,200 mg by mouth See admin instructions. Take 1 tablet (600mg ) by mouth in the morning and 2 tablets (1200mg ) by mouth at  bedtime   Gemtesa  75 MG Tabs Generic drug: Vibegron  Take 1 tablet (75 mg total) by mouth daily.   hydrOXYzine  10 MG tablet Commonly known as: ATARAX  Take 1-2 tablets (10-20 mg total) by mouth at bedtime as needed for anxiety.  levothyroxine 125 MCG tablet Commonly known as: SYNTHROID Take 1 tablet by mouth every morning on an empty stomach with water only.  No food or other medications for 30 minutes.   omeprazole 40 MG capsule Commonly known as: PRILOSEC Take 1 capsule (40 mg total) by mouth 2 (two) times daily before a meal.   ondansetron 4 MG disintegrating tablet Commonly known as: ZOFRAN-ODT Take 1 tablet (4 mg total) by mouth every 8 (eight) hours as needed for nausea or vomiting.   rOPINIRole 1 MG tablet Commonly known as: REQUIP Take 1 tablet (1 mg total) by mouth 3 (three) times daily.   tiZANidine 4 MG tablet Commonly known as: ZANAFLEX Take 1 tablet (4 mg total) by mouth every 8 (eight) hours as needed for muscle spasms.   trimethoprim 100 MG tablet Commonly known as: TRIMPEX Take 1 tablet (100 mg total) by mouth daily.        Allergies:  Allergies  Allergen Reactions   Azithromycin Diarrhea and Rash    fainting  Other reaction(s): Syncope  fainting    Other reaction(s): NOT KNOWN  fainting   Meloxicam Diarrhea   Morphine Other (See Comments) and Rash    Other reaction(s): Unknown Retain urine Other reaction(s): NOT KNOWN, Other (See Comments) Retain urine Other reaction(s): side effect of severe rash   Adhesive [Tape]     blisters   Codeine Nausea And Vomiting   Lactose Intolerance (Gi) Other (See Comments)    stomach pain   Red Dye #40 (Allura Red) Itching    red food dye   Shellfish-Derived Products Itching and Swelling   Aspirin Rash   Latex Rash    With extended contact   Lodine [Etodolac] Rash   Morphine And Codeine Hives and Nausea And Vomiting    Other reaction(s): UNKNOWN   Penicillins Rash and Hives    Given on  05/07/20.  No reaction noted.   Pravastatin Rash    Family History: Family History  Problem Relation Age of Onset   Hypertension Mother    Cancer Mother    Diabetes Father    Hypertension Father    Heart failure Father    Prostate cancer Brother    Breast cancer Neg Hx     Social History:  reports that she quit smoking about 25 years ago. Her smoking use included cigarettes. She has been exposed to tobacco smoke. She has never used smokeless tobacco. She reports that she does not drink alcohol and does not use drugs.  ROS:                                        Physical Exam: BP (!) 153/95 (BP Location: Left Arm, Patient Position: Sitting, Cuff Size: Large)   Pulse 91   Ht 5' (1.524 m)   Wt 91.2 kg   SpO2 96%   BMI 39.26 kg/m   Constitutional:  Alert and oriented, No acute distress. HEENT: [place] AT, moist mucus membranes.  Trachea midline, no masses.  Lab Results  Component Value Date   WBC 7.6 12/22/2023   HGB 13.0 12/22/2023   HCT 39.9 12/22/2023   MCV 91.9 12/22/2023   PLT 288 12/22/2023    Lab Results  Component Value Date   CREATININE 0.81 10/21/2023    No results found for: PSA  No results found for: TESTOSTERONE  Lab Results  Component Value Date  HGBA1C 5.0 08/09/2023    Urinalysis    Component Value Date/Time   COLORURINE STRAW (A) 04/17/2023 1808   APPEARANCEUR Clear 12/18/2023 0903   LABSPEC 1.010 04/17/2023 1808   LABSPEC 1.005 07/23/2014 1444   PHURINE 7.0 04/17/2023 1808   GLUCOSEU Negative 12/18/2023 0903   GLUCOSEU NEGATIVE 07/23/2014 1444   HGBUR NEGATIVE 04/17/2023 1808   BILIRUBINUR Negative 12/18/2023 0903   BILIRUBINUR NEGATIVE 07/23/2014 1444   KETONESUR NEGATIVE 04/17/2023 1808   PROTEINUR Negative 12/18/2023 0903   PROTEINUR NEGATIVE 04/17/2023 1808   NITRITE Negative 12/18/2023 0903   NITRITE NEGATIVE 04/17/2023 1808   LEUKOCYTESUR Trace (A) 12/18/2023 0903   LEUKOCYTESUR NEGATIVE 04/17/2023  1808   LEUKOCYTESUR NEGATIVE 07/23/2014 1444    Pertinent Imaging:   Assessment & Plan: Reassess in 6 weeks on oxybutynin  ER 10 mg 30 x 11  1. Chronic cystitis (Primary)  - Urinalysis, Complete   No follow-ups on file.  Stacy DELENA Elizabeth, MD  [facility] [address], [address] [place], [place] [address] [phone]

## 2024-02-08 ENCOUNTER — Ambulatory Visit: Admitting: Clinical

## 2024-02-08 DIAGNOSIS — F330 Major depressive disorder, recurrent, mild: Secondary | ICD-10-CM

## 2024-02-08 DIAGNOSIS — F419 Anxiety disorder, unspecified: Secondary | ICD-10-CM | POA: Diagnosis not present

## 2024-02-08 LAB — CULTURE, URINE COMPREHENSIVE

## 2024-02-08 NOTE — Progress Notes (Signed)
   Darice Seats, LCSW

## 2024-02-08 NOTE — Progress Notes (Signed)
 [facility] Counselor/Therapist Progress Note  Patient ID: Stacy Allen, MRN: [id number],    Date: 02/08/2024  Time Spent: 2:34pm - 3:35pm : 61 minutes   Treatment Type: Individual Therapy  Reported Symptoms: fluctuations in mood  Mental Status Exam: Appearance:  Neat and Well Groomed     Behavior: Appropriate  Motor: Normal  Speech/Language:  Clear and Coherent and Normal Rate  Affect: Appropriate  Mood: normal  Thought process: normal  Thought content:   WNL  Sensory/Perceptual disturbances:   WNL  Orientation: oriented to person, place, time/date, and situation  Attention: Good  Concentration: Good  Memory: WNL  Fund of knowledge:  Good  Insight:   Good  Judgment:  Good  Impulse Control: Good   Risk Assessment: Danger to Self:  No Patient denied current suicidal ideation  Self-injurious Behavior: No Danger to Others: No Patient denied current homicidal ideation Duty to Warn:no Physical Aggression / Violence:No  Access to Firearms a concern: No  Gang Involvement:No   Subjective: Patient stated, I didn't do my homework and reported a family member moved patient's homework . Patient stated, I was so quick to think about all the things I can't do that I desire to do in reference to homework assignment. Patient reported patient has been walking frequently and patient reported she enjoys going for walks and looking at the lilac trees. Patient reported two flowers have bloomed without patient planting the flowers and patient's father's pepper plant is growing. Patient reported patient's nephew's brother died on 2024-03-22 and nephew's aunt died today. Patient reported patient wants to attend the funeral services tomorrow. Patient stated, that kind of gets you in reference to recent losses. Patient reported patient's daughter visited recently and patient stated, we had a good time. Patient reported patient's mood has fluctuated due to chronic pain. Patient  reported she experienced visual hallucinations recently when taking Requip  and patient reported stopping the Requip . Patient reported improvement in sleep and reported waking up less frequently at night. Patient reported waking up 3-4 times per night with new medication. Patient stated, I'm trying my best to stay positive, Its been a good week or two.   Interventions: Cognitive Behavioral Therapy and supportive therapy. Clinician conducted session in person at clinician's office at [facility]. Reviewed events since last session and assessed for changes. Discussed patient's homework and barriers to completing homework. Reviewed patient's physical activity and participation in positive activities since last session. Discussed and identified areas of gratitude since last session. Provided supportive therapy, active listening, and validation as patient discussed recent losses. Identified triggers for fluctuations in mood. Discussed symptoms in response to new medication. Discussed recent improvement in sleep. Clinician requested for homework patient develop a list of tasks/activities patient can perform/participate in and continue gratitude journal.   Collaboration of Care: not required at this time   Diagnosis:  Mild episode of recurrent major depressive disorder (HCC)   Anxiety disorder, unspecified type     Plan: Patient is to utilize [employer] Therapy, thought re-framing, mindfulness and coping strategies to decrease symptoms associated with their diagnosis. Frequency: bi-weekly  Modality: individual      Long-term goal:   Patient stated, I guess I want to see my being able to recognize and appreciate what I have instead of focusing on all I've lost.  Target Date: 11/30/24  Progress: goal has been met    Short-term goal:  Write at least one positive affirmation about herself and/or patient's environment each day Target Date:  11/30/24  Progress: progressing    Practice  gratitude exercises to decrease symptoms of depression Target Date: 11/30/24  Progress: progressing    Identify, challenge, and replace negative thought patterns that contribute to feelings of depression and anxiety with positive thoughts and beliefs per patient's report Target Date: 11/30/24  Progress: progressing    Develop and implement strategies to increase self care, such as, participating in water exercise class, accepting invitations to social functions Target Date: 11/30/24  Progress: progressing    verbalize positive statements regarding self and her ability to cope with life stressors Target Date: 11/30/24  Progress: progressing      Last -   Darice Seats, LCSW

## 2024-02-13 ENCOUNTER — Ambulatory Visit: Payer: Self-pay

## 2024-02-13 DIAGNOSIS — R3989 Other symptoms and signs involving the genitourinary system: Secondary | ICD-10-CM

## 2024-02-13 MED ORDER — NITROFURANTOIN MACROCRYSTAL 100 MG PO CAPS: 100.0000 mg | ORAL_CAPSULE | Freq: Two times a day (BID) | ORAL | 0 refills | Status: AC

## 2024-02-20 DIAGNOSIS — M7061 Trochanteric bursitis, right hip: Secondary | ICD-10-CM | POA: Diagnosis not present

## 2024-02-20 DIAGNOSIS — M7062 Trochanteric bursitis, left hip: Secondary | ICD-10-CM | POA: Diagnosis not present

## 2024-02-20 DIAGNOSIS — M5116 Intervertebral disc disorders with radiculopathy, lumbar region: Secondary | ICD-10-CM | POA: Diagnosis not present

## 2024-02-23 ENCOUNTER — Other Ambulatory Visit (INDEPENDENT_AMBULATORY_CARE_PROVIDER_SITE_OTHER): Payer: Self-pay | Admitting: Vascular Surgery

## 2024-02-28 DIAGNOSIS — M25551 Pain in right hip: Secondary | ICD-10-CM | POA: Diagnosis not present

## 2024-02-28 DIAGNOSIS — M5416 Radiculopathy, lumbar region: Secondary | ICD-10-CM | POA: Diagnosis not present

## 2024-02-28 DIAGNOSIS — M5441 Lumbago with sciatica, right side: Secondary | ICD-10-CM | POA: Diagnosis not present

## 2024-02-28 DIAGNOSIS — G8929 Other chronic pain: Secondary | ICD-10-CM | POA: Diagnosis not present

## 2024-02-28 DIAGNOSIS — M5442 Lumbago with sciatica, left side: Secondary | ICD-10-CM | POA: Diagnosis not present

## 2024-02-29 ENCOUNTER — Other Ambulatory Visit: Payer: Self-pay | Admitting: Physical Medicine & Rehabilitation

## 2024-02-29 ENCOUNTER — Other Ambulatory Visit: Payer: Self-pay

## 2024-02-29 DIAGNOSIS — G8929 Other chronic pain: Secondary | ICD-10-CM

## 2024-02-29 MED ORDER — TRIMETHOPRIM 100 MG PO TABS: 100.0000 mg | ORAL_TABLET | Freq: Every day | ORAL | 11 refills | Status: DC

## 2024-03-06 ENCOUNTER — Inpatient Hospital Stay
Admission: RE | Admit: 2024-03-06 | Discharge: 2024-03-06 | Disposition: A | Discharge status: Home / Self Care | Source: Ambulatory Visit | Attending: Physical Medicine & Rehabilitation | Admitting: Physical Medicine & Rehabilitation

## 2024-03-06 DIAGNOSIS — G8929 Other chronic pain: Secondary | ICD-10-CM

## 2024-03-06 DIAGNOSIS — M5126 Other intervertebral disc displacement, lumbar region: Secondary | ICD-10-CM | POA: Diagnosis not present

## 2024-03-06 DIAGNOSIS — M5136 Other intervertebral disc degeneration, lumbar region with discogenic back pain only: Secondary | ICD-10-CM | POA: Diagnosis not present

## 2024-03-07 ENCOUNTER — Telehealth: Payer: Self-pay | Admitting: *Deleted

## 2024-03-07 NOTE — Telephone Encounter (Signed)
 Patient called in and said that Dr. Melanee said if she gets too fatigued then she can make her appointment sooner than later than the September 29 appointment.  She would like to someone call her back to see if we can move up her labs.I called her the patient to let her know that Dr. Melanee was off on Thursdays but I sent it to her and hopefully she will get it in touch with her [facility]bane tomorrow.  However when I started them make the call she has a voicemail that has not been set up and so you cannot  leave a message.

## 2024-03-08 NOTE — Telephone Encounter (Signed)
 Appt is on 9/17 now

## 2024-03-11 DIAGNOSIS — H43813 Vitreous degeneration, bilateral: Secondary | ICD-10-CM | POA: Diagnosis not present

## 2024-03-11 DIAGNOSIS — H26491 Other secondary cataract, right eye: Secondary | ICD-10-CM | POA: Diagnosis not present

## 2024-03-11 DIAGNOSIS — Z961 Presence of intraocular lens: Secondary | ICD-10-CM | POA: Diagnosis not present

## 2024-03-11 DIAGNOSIS — H353133 Nonexudative age-related macular degeneration, bilateral, advanced atrophic without subfoveal involvement: Secondary | ICD-10-CM | POA: Diagnosis not present

## 2024-03-12 ENCOUNTER — Ambulatory Visit (INDEPENDENT_AMBULATORY_CARE_PROVIDER_SITE_OTHER): Admitting: Clinical

## 2024-03-12 DIAGNOSIS — F330 Major depressive disorder, recurrent, mild: Secondary | ICD-10-CM | POA: Diagnosis not present

## 2024-03-12 DIAGNOSIS — F419 Anxiety disorder, unspecified: Secondary | ICD-10-CM | POA: Diagnosis not present

## 2024-03-12 NOTE — Progress Notes (Unsigned)
 [facility] Counselor/Therapist Progress Note  Patient ID: YATZARI JONSSON, MRN: [id number],    Date: 03/12/2024  Time Spent: 12:35pm - 1:35pm : 60 minutes   Treatment Type: Individual Therapy  Reported Symptoms: Patient reported recent feelings of sadness and loneliness  Mental Status Exam: Appearance:  Neat and Well Groomed     Behavior: Appropriate  Motor: Normal  Speech/Language:  Clear and Coherent and Normal Rate  Affect: Appropriate  Mood: normal  Thought process: tangential  Thought content:   Tangential  Sensory/Perceptual disturbances:   WNL  Orientation: oriented to person, place, time/date, and situation  Attention: Good  Concentration: Good  Memory: WNL  Fund of knowledge:  Good  Insight:   Good  Judgment:  Good  Impulse Control: Good   Risk Assessment: Danger to Self:  No Patient denied current suicidal ideation  Self-injurious Behavior: No Danger to Others: No Patient denied current homicidal ideation Duty to Warn:no Physical Aggression / Violence:No  Access to Firearms a concern: No  Gang Involvement:No   Subjective: Patient stated, there would be weeks where I was like I'm not going to make it and reported loneliness, sadness in response to events since last session. Patient reported daughter recently visited and patient stated, I don't know if they're coming and going started it in reference to changes in mood. Patient reported patient has been experiencing pain in patient's back and hip.  Patient reported difficulty putting weight on patient's hip at times and difficulty with mobility at times. Patient stated, I don't just get tired, I get the wobbles about 5 o'clock in the evening and reported feeling patient has to lay down/rest. Patient stated,  our house is in a pure mess, everybody is really trying to be positive and everybody is going negative, its just not a good environment. Patient reported patient was information local  transportation resources are not currently accepting new clients. Patient stated, I do everything I can for myself, I'm still trying to be a positive person, I'm trying to even say positive things to them (family). Patient stated,  they (family) know how hard I'm trying to be positive. Patient stated, I feel unwanted, I don't know why I feel that. Patient stated, I'm just as high as a kite and as low as you can go in reference to mood.   Interventions: Cognitive Behavioral Therapy.  Clinician conducted session in person at clinician's office at [facility]. Reviewed events since last session and assessed for changes. Explored triggers for recent changes in mood. Discussed patient's concerns as it relates to patient's health/pain. Discussed patient's living environment and explored the impact on patient's mood. Reviewed patient's use of positive self talk and the outcome. Explored triggers for feeling unwanted. Clinician requested for homework patient complete a thought record.    Collaboration of Care: not required at this time   Diagnosis:  Mild episode of recurrent major depressive disorder (HCC)   Anxiety disorder, unspecified type     Plan: Patient is to utilize [employer] Therapy, thought re-framing, mindfulness and coping strategies to decrease symptoms associated with their diagnosis. Frequency: bi-weekly  Modality: individual      Long-term goal:   Patient stated, I guess I want to see my being able to recognize and appreciate what I have instead of focusing on all I've lost.  Target Date: 11/30/24  Progress: goal has been met    Short-term goal:  Write at least one positive affirmation about herself and/or patient's environment each day  Target Date: 11/30/24  Progress: progressing    Practice gratitude exercises to decrease symptoms of depression Target Date: 11/30/24  Progress: progressing    Identify, challenge, and replace negative thought  patterns that contribute to feelings of depression and anxiety with positive thoughts and beliefs per patient's report Target Date: 11/30/24  Progress: progressing    Develop and implement strategies to increase self care, such as, participating in water exercise class, accepting invitations to social functions Target Date: 11/30/24  Progress: progressing    verbalize positive statements regarding self and her ability to cope with life stressors Target Date: 11/30/24  Progress: progressing       Darice Seats, LCSW

## 2024-03-12 NOTE — Progress Notes (Unsigned)
   Darice Seats, LCSW

## 2024-03-13 ENCOUNTER — Ambulatory Visit: Payer: Self-pay | Admitting: Oncology

## 2024-03-13 ENCOUNTER — Inpatient Hospital Stay (HOSPITAL_BASED_OUTPATIENT_CLINIC_OR_DEPARTMENT_OTHER): Admitting: Oncology

## 2024-03-13 ENCOUNTER — Encounter: Payer: Self-pay | Admitting: Oncology

## 2024-03-13 ENCOUNTER — Inpatient Hospital Stay: Attending: Oncology

## 2024-03-13 VITALS — BP 150/86 | HR 70 | Temp 98.0°F | Resp 22

## 2024-03-13 DIAGNOSIS — D509 Iron deficiency anemia, unspecified: Secondary | ICD-10-CM

## 2024-03-13 DIAGNOSIS — D508 Other iron deficiency anemias: Secondary | ICD-10-CM

## 2024-03-13 LAB — CBC WITH DIFFERENTIAL (CANCER CENTER ONLY)
Abs Immature Granulocytes: 0.04 K/uL (ref 0.00–0.07)
Basophils Absolute: 0.1 K/uL (ref 0.0–0.1)
Basophils Relative: 1 %
Eosinophils Absolute: 0.1 K/uL (ref 0.0–0.5)
Eosinophils Relative: 1 %
HCT: 38.6 % (ref 36.0–46.0)
Hemoglobin: 12.7 g/dL (ref 12.0–15.0)
Immature Granulocytes: 1 %
Lymphocytes Relative: 11 %
Lymphs Abs: 0.9 K/uL (ref 0.7–4.0)
MCH: 29.7 pg (ref 26.0–34.0)
MCHC: 32.9 g/dL (ref 30.0–36.0)
MCV: 90.2 fL (ref 80.0–100.0)
Monocytes Absolute: 0.9 K/uL (ref 0.1–1.0)
Monocytes Relative: 11 %
Neutro Abs: 6.4 K/uL (ref 1.7–7.7)
Neutrophils Relative %: 75 %
Platelet Count: 226 K/uL (ref 150–400)
RBC: 4.28 MIL/uL (ref 3.87–5.11)
RDW: 13.2 % (ref 11.5–15.5)
WBC Count: 8.4 K/uL (ref 4.0–10.5)
nRBC: 0 % (ref 0.0–0.2)

## 2024-03-13 LAB — IRON AND TIBC
Iron: 49 ug/dL (ref 28–170)
Saturation Ratios: 13 % (ref 10.4–31.8)
TIBC: 370 ug/dL (ref 250–450)
UIBC: 321 ug/dL

## 2024-03-13 LAB — FERRITIN: Ferritin: 22 ng/mL (ref 11–307)

## 2024-03-13 NOTE — Progress Notes (Signed)
 Pt in for follow up, reports requested appt be moved up because she has been more fatigued and dizziness.  BP checked sitting and standing.  Pt became more SOB and dizzy when standing.

## 2024-03-13 NOTE — Progress Notes (Signed)
 Hematology/Oncology Consult note [facility]  [phone] [phone]  Patient Care Team: Gretta Comer POUR, NP as PCP - General (Internal Medicine)   Name of the patient: Stacy Allen  [id number]  [birth year]   Date of visit: 03/13/24  Diagnosis-iron  deficiency anemia  Chief complaint/ Reason for visit-routine follow-up of iron  deficiency anemia  Heme/Onc history: Patient is a 83 year old female with a past medical history significant for hypertension hyperlipidemia, CHF arthritis hypothyroidism among other medical problems.. She has a history of right lower extremity DVT back in 2021 following her hip surgery and she remained on Eliquis  since then until her recent episode of GI bleed.  She does not recollect if she has had any other episodes of DVT prior to that. 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  She has had admissions for GI bleed in October 2024 and presented with similar complaints in December 2024.  Eliquis  was discontinued she had EGD and colonoscopy in October 2024 which showed 10 cm hiatal hernia and benign-appearing gastric polyp but no evidence of bleeding.  Colonoscopy at the same time had shown polyp in the cecum and the ascending colon which were resected and were negative for malignancy.  During her hospitalization in December 2024 she also underwent a capsule endoscopy which was negative.  CBC from 07/21/2023 showed an H&H of 9.1/30 with an MCV of 82.4.  White count and platelet counts were normal.  She had ferritin and iron  studies checked back in October 2024 which showed evidence of iron  deficiency.  Last blood transfusion was given on 07/07/2023.  Patient received 5 doses of Venofer  in February 2025     Interval history-currently patient reports  feeling fatigued and occasionally dizzy.  Also reports some dry mouth.  ECOG PS- 2 Pain scale- 0   Review of systems- Review of Systems  Constitutional:  Positive for malaise/fatigue. Negative for chills, fever and weight loss.  HENT:  Negative for congestion, ear discharge and nosebleeds.   Eyes:  Negative for blurred vision.  Respiratory:  Negative for cough, hemoptysis, sputum production, shortness of breath and wheezing.   Cardiovascular:  Negative for chest pain, palpitations, orthopnea and claudication.  Gastrointestinal:  Negative for abdominal pain, blood in stool, constipation, diarrhea, heartburn, melena, nausea and vomiting.  Genitourinary:  Negative for dysuria, flank pain, frequency, hematuria and urgency.  Musculoskeletal:  Negative for back pain, joint pain and myalgias.  Skin:  Negative for rash.  Neurological:  Positive for dizziness. Negative for tingling, focal weakness, seizures, weakness and headaches.  Endo/Heme/Allergies:  Does not bruise/bleed easily.  Psychiatric/Behavioral:  Negative for depression and suicidal ideas. The patient does not have insomnia.       Allergies  Allergen Reactions   Azithromycin Diarrhea and Rash    fainting  Other reaction(s): Syncope  fainting    Other reaction(s): NOT KNOWN  fainting   Meloxicam Diarrhea   Morphine  Other (See Comments) and Rash    Other reaction(s): Unknown Retain urine Other reaction(s): NOT KNOWN, Other (See Comments) Retain urine Other reaction(s): side effect of severe rash   Adhesive [Tape]     blisters   Codeine Nausea And Vomiting   Lactose Intolerance (Gi) Other (See Comments)    stomach pain   Red Dye #40 (Allura Red) Itching    red food dye  Shellfish-Derived Products Itching and Swelling   Aspirin  Rash   Latex Rash    With extended contact   Lodine [Etodolac] Rash   Morphine  And Codeine Hives and Nausea And Vomiting    Other reaction(s): UNKNOWN   Penicillins Rash and  Hives    Given on 05/07/20.  No reaction noted.   Pravastatin Rash     Past Medical History:  Diagnosis Date   Acute GI bleeding 04/18/2023   Adenomatous polyps    Allergic state    Anemia    Anginal pain (HCC)    Anticoagulated 04/18/2023   Anxiety    Arthritis    osteoarthritis   Benign neoplasm of colon 04/21/2023   Broken rib    left   C. difficile diarrhea 10/21/2021   Cancer (HCC)    skin   CHF (congestive heart failure) (HCC)    Chicken pox    Complication of anesthesia    respiratory distress after rectocele surgery   Coronary artery disease    Depression    Eczema    Fibromyalgia    Gastric polyp 04/19/2023   GERD (gastroesophageal reflux disease)    GI bleed 04/18/2023   Headache    migraines   Hemorrhoids    History of hiatal hernia    Hypercalcemia    Hyperlipidemia    Hypertension    Hypothyroidism    Lumbar spondylosis 06/01/2017   Lumbar stenosis    Melena 04/19/2023   Obesity    Peptic ulcer disease    Shortness of breath dyspnea    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 Kathi, [facility], Spinal Fusion   CARDIAC CATHETERIZATION N/A 04/03/2015   Procedure: Left Heart Cath and Coronary Angiography;  Surgeon: Vinie DELENA Jude, MD;  Location: [facility] INVASIVE CV LAB;  Service: Cardiovascular;  Laterality: N/A;   CATARACT EXTRACTION W/PHACO Left 04/22/2019   Procedure: CATARACT EXTRACTION PHACO AND INTRAOCULAR LENS PLACEMENT (IOC) LEFT  00:45.4  12.9%  6.03;  Surgeon: Myrna Adine Anes, MD;  Location: [facility];  Service: Ophthalmology;  Laterality: Left;  Latex sleep apnea   CATARACT EXTRACTION W/PHACO Right 07/01/2019   Procedure: CATARACT EXTRACTION PHACO AND INTRAOCULAR LENS PLACEMENT (IOC) RIGHT 2.38  00:24.4;  Surgeon: Myrna Adine Anes, MD;  Location: [facility];  Service: Ophthalmology;  Laterality: Right;  Latex Sleep apnea   CHOLECYSTECTOMY     COLONOSCOPY WITH PROPOFOL  N/A 04/21/2023   Procedure: COLONOSCOPY WITH PROPOFOL ;  Surgeon: Leigh Elspeth SQUIBB, MD;  Location: [facility] ENDOSCOPY;  Service: Gastroenterology;  Laterality: N/A;   CORONARY ANGIOPLASTY  2015   Dr. Jude, [facility] Cath Lab   ESOPHAGOGASTRODUODENOSCOPY (EGD) WITH PROPOFOL  N/A 12/21/2016   Procedure: ESOPHAGOGASTRODUODENOSCOPY (EGD) WITH PROPOFOL ;  Surgeon: Viktoria Lamar DASEN, MD;  Location: [facility] ENDOSCOPY;  Service: Endoscopy;  Laterality: N/A;   ESOPHAGOGASTRODUODENOSCOPY (EGD) WITH PROPOFOL  N/A 12/08/2022   Procedure: ESOPHAGOGASTRODUODENOSCOPY (EGD) WITH PROPOFOL ;  Surgeon: Onita Elspeth Sharper, DO;  Location: [facility] ENDOSCOPY;  Service: Gastroenterology;  Laterality: N/A;   ESOPHAGOGASTRODUODENOSCOPY (EGD) WITH PROPOFOL  N/A 04/19/2023   Procedure: ESOPHAGOGASTRODUODENOSCOPY (EGD) WITH PROPOFOL ;  Surgeon: Leigh Elspeth SQUIBB, MD;  Location: [facility] ENDOSCOPY;  Service: Gastroenterology;  Laterality: N/A;   EYE SURGERY     FRACTURE  SURGERY Left 1991   Fractures Femur, [facility]   GIVENS CAPSULE STUDY N/A 06/22/2023   Procedure: GIVENS CAPSULE STUDY;  Surgeon: Jinny Carmine, MD;  Location: [facility] ENDOSCOPY;  Service: Endoscopy;  Laterality: N/A;   HEMOSTASIS CLIP PLACEMENT  04/19/2023   Procedure: HEMOSTASIS CLIP PLACEMENT;  Surgeon: Leigh Elspeth SQUIBB, MD;  Location: [facility] ENDOSCOPY;  Service: Gastroenterology;;   HIP ARTHROPLASTY Right 05/07/2020   Procedure: ARTHROPLASTY RIGHT HIP (HEMIARTHROPLASTY);  Surgeon: Marchia Drivers, MD;  Location: [facility] ORS;  Service: Orthopedics;  Laterality: Right;   IMAGE GUIDED SINUS SURGERY Bilateral 09/28/2015   Procedure: IMAGE GUIDED SINUS SURGERY, SEPTOPLASTY, BILATERAL FRONTAL SINUSOTOMIES, BILATERAL MAXILLARY ANTROSTOMIES, BILATERAL TOTAL ETHMOIDECTOMY, BILATERAL SPHENOIDECTOMY, BILATERAL INFERIOR TURBINATE  REDUCTION;  Surgeon: Deward Argue, MD;  Location: [facility] ORS;  Service: ENT;  Laterality: Bilateral;   LEFT HEART CATH AND CORONARY ANGIOGRAPHY Left 01/19/2023   Procedure: LEFT HEART CATH AND CORONARY ANGIOGRAPHY;  Surgeon: Ammon Blunt, MD;  Location: [facility] INVASIVE CV LAB;  Service: Cardiovascular;  Laterality: Left;   POLYPECTOMY  04/19/2023   Procedure: POLYPECTOMY;  Surgeon: Leigh Elspeth SQUIBB, MD;  Location: [facility] ENDOSCOPY;  Service: Gastroenterology;;   POLYPECTOMY  04/21/2023   Procedure: POLYPECTOMY;  Surgeon: Leigh Elspeth SQUIBB, MD;  Location: [facility] ENDOSCOPY;  Service: Gastroenterology;;   RECTOCELE REPAIR      Social History   Socioeconomic History   Marital status: Widowed    Spouse name: Not on file   Number of children: 1   Years of education: Not on file   Highest education level: 8th grade  Occupational History   Occupation: Retired  Tobacco Use   Smoking status: Former    Current packs/day: 0.00    Types: Cigarettes    Quit date: 2000    Years since quitting: 25.7    Passive exposure: Past   Smokeless tobacco: Never  Vaping Use   Vaping status: Never Used  Substance and Sexual Activity   Alcohol  use: No   Drug use: No   Sexual activity: Not Currently  Other Topics Concern   Not on file  Social History Narrative   Lives in camper in nephew's backyard. No pets.   Social Drivers of Corporate investment banker Strain: Low Risk  (02/28/2024)   Received from [facility]   Overall Financial Resource Strain (CARDIA)    Difficulty of Paying Living Expenses: Not hard at all  Food Insecurity: No Food Insecurity (02/28/2024)   Received from [facility]   Hunger Vital Sign    Within the past 12 months, you worried that your food would run out before you got the money to buy more.: Never true    Within the past 12 months, the food you bought just didn't last and you didn't have money to get more.: Never true  Transportation  Needs: No Transportation Needs (02/28/2024)   Received from [facility] - Transportation    In the past 12 months, has lack of transportation kept you from medical appointments or from getting medications?: No    Lack of Transportation (Non-Medical): No  Physical Activity: Not on file  Stress: Not on file  Social Connections: Not on file  Intimate Partner Violence: Not At Risk (07/26/2023)   Humiliation, Afraid, Rape, and Kick questionnaire    Fear of Current or Ex-Partner: No    Emotionally Abused: No    Physically Abused: No    Sexually Abused: No    Family History  Problem Relation Age of Onset   Hypertension Mother    Cancer Mother    Diabetes Father    Hypertension Father    Heart failure Father    Prostate cancer Brother    Breast cancer Neg Hx      Current Outpatient Medications:    acetaminophen  (TYLENOL ) 500 MG tablet, Take 500 mg by mouth every 6 (six) hours as needed., Disp: , Rfl:    albuterol  (PROVENTIL  HFA;VENTOLIN  HFA) 108 (90 BASE) MCG/ACT inhaler, Inhale 2 puffs into the lungs every 6 (six) hours as needed for wheezing or shortness of breath., Disp: , Rfl:    amLODipine  (NORVASC ) 5 MG tablet, Take 1 tablet (5 mg total) by mouth daily. for blood pressure., Disp: 30 tablet, Rfl: 0   carboxymethylcellulose (REFRESH PLUS) 0.5 % SOLN, Place 1 drop into both eyes daily as needed (dry eyes)., Disp: , Rfl:    citalopram  (CELEXA ) 20 MG tablet, Take 20 mg by mouth daily., Disp: , Rfl:    clonazePAM  (KLONOPIN ) 1 MG tablet, Take 1 mg by mouth at bedtime., Disp: , Rfl:    fluticasone  (FLONASE ) 50 MCG/ACT nasal spray, Place into both nostrils., Disp: , Rfl:    Fluticasone -Salmeterol (ADVAIR HFA IN), Inhale 1 puff into the lungs daily as needed., Disp: , Rfl:    gabapentin  (NEURONTIN ) 600 MG tablet, Take 600-1,200 mg by mouth See admin instructions. Take 1 tablet (600mg ) by mouth in the morning and 2 tablets (1200mg ) by mouth at bedtime, Disp: , Rfl:     levothyroxine  (SYNTHROID ) 125 MCG tablet, Take 1 tablet by mouth every morning on an empty stomach with water only.  No food or other medications for 30 minutes., Disp: 90 tablet, Rfl: 2   omeprazole  (PRILOSEC) 40 MG capsule, Take 1 capsule (40 mg total) by mouth 2 (two) times daily before a meal., Disp: 60 capsule, Rfl: 1   ondansetron  (ZOFRAN -ODT) 4 MG disintegrating tablet, Take 1 tablet (4 mg total) by mouth every 8 (eight) hours as needed for nausea or vomiting., Disp: 20 tablet, Rfl: 0   oxybutynin  (DITROPAN -XL) 10 MG 24 hr tablet, Take 1 tablet (10 mg total) by mouth daily., Disp: 30 tablet, Rfl: 11   trimethoprim  (TRIMPEX ) 100 MG tablet, Take 1 tablet (100 mg total) by mouth daily., Disp: 30 tablet, Rfl: 11   hydrOXYzine  (ATARAX ) 10 MG tablet, Take 1-2 tablets (10-20 mg total) by mouth at bedtime as needed for anxiety., Disp: 90 tablet, Rfl: 0   tiZANidine  (ZANAFLEX ) 4 MG tablet, Take 1 tablet (4 mg total) by mouth every 8 (eight) hours as needed for muscle spasms. (Patient not taking: Reported on 03/13/2024), Disp: 30 tablet, Rfl: 0  Physical exam: There were no vitals filed for this visit. Physical Exam Constitutional:      Comments: Sitting in a wheelchair.  Appears in no acute distress  Cardiovascular:     Rate and Rhythm: Normal rate and regular rhythm.     Heart sounds: Normal heart sounds.  Pulmonary:     Effort: Pulmonary effort is normal.     Breath sounds: Normal breath sounds.  Skin:    General: Skin is warm and dry.  Neurological:     Mental Status: She is alert and oriented to person, place, and time.      I have personally reviewed labs listed below:    Latest Ref Rng & Units 10/21/2023   12:23 PM  CMP  Glucose 70 - 99 mg/dL 899   BUN 8 -  23 mg/dL 19   Creatinine 9.55 - 1.00 mg/dL 9.18   Sodium 864 - 854 mmol/L 132   Potassium 3.5 - 5.1 mmol/L 3.9   Chloride 98 - 111 mmol/L 96   CO2 22 - 32 mmol/L 25   Calcium  8.9 - 10.3 mg/dL 9.3   Total Protein 6.5 - 8.1  g/dL 6.9   Total Bilirubin 0.0 - 1.2 mg/dL 0.5   Alkaline Phos 38 - 126 U/L 90   AST 15 - 41 U/L 23   ALT 0 - 44 U/L 23       Latest Ref Rng & Units 03/13/2024   12:54 PM  CBC  WBC 4.0 - 10.5 K/uL 8.4   Hemoglobin 12.0 - 15.0 g/dL 87.2   Hematocrit 63.9 - 46.0 % 38.6   Platelets 150 - 400 K/uL 226       Assessment and plan- Patient is a 83 y.o. female here for routine follow-up of iron  deficiency anemia  Patient is not presently anemic with an H&H of 12.7/38.6.  Iron  studies from today are pending.  Patient last received IV iron  in April 2025.  We will call her if she needs IV iron .  CBC ferritin and iron  studies in 3 months in 6 months and I will see her back in 6 months   Visit Diagnosis 1. Iron  deficiency anemia, unspecified iron  deficiency anemia type      Dr. Annah Skene, MD, MPH [facility] [phone] 03/13/2024 1:22 PM

## 2024-03-18 ENCOUNTER — Ambulatory Visit: Admitting: Urology

## 2024-03-18 VITALS — BP 159/88 | HR 74

## 2024-03-18 DIAGNOSIS — N3281 Overactive bladder: Secondary | ICD-10-CM

## 2024-03-18 DIAGNOSIS — R351 Nocturia: Secondary | ICD-10-CM | POA: Diagnosis not present

## 2024-03-18 NOTE — Progress Notes (Signed)
 03/18/2024 9:51 AM   Stacy Allen [birth year] [id number]  Referring provider: Gretta Comer POUR, NP [address] [place],  [place] [address]  No chief complaint on file.   HPI: I was consulted to assess the patient's urinary incontinence and infections.  She has urge incontinence.  No bedwetting.  I believe if she has to go too far she has foot on the floor syndrome.  She may rarely leak with sneezing.  She was not a strong historian.  She says she does not wear pads normally but wears a pull-up at night   It also appears that the patient was referred for microscopic hematuria   She can void every hour and then hold it for several hours.  She can get up 10 times at night.  Sometimes has ankle edema.  No diuretic.   She will have urgency and get a false signal and then have a poor flow   She thinks he gets a bladder infection every 2 months with burning urgency that respond favorably to antibiotics.  She said currently it hurts a little bit at the end of urination but the urine is otherwise clear   She has had low back surgery.  She has had a hysterectomy.   I reviewed urine culture from the last few years and 2 were negative and 1 was positive.  She had a normal CT scan June 2023.  It turns out she had a CT scan of the abdomen pelvis and chest in October when she was admitted and it was within normal limits   She no longer takes daily Eliquis .  She is not on aspirin .  She has a smoking history     Pathophysiology of UTIs discussed.  I would like to put her on trimethoprim  100 mg 30 x 11 and have her come back in 6 to 8 weeks for cystoscopy and pelvic examination.  Hopefully some of her urgency and frequency will also settle down.  She likely also has a nocturnal diuresis.  Workup for hematuria discussed.  Return on trimethoprim  for cystoscopy.     It will be interesting to some of her symptoms especially the nocturia down regulate some on the trimethoprim .   Patient is very  pleased that she is clinically not having infections.  No discomfort.  Now gets up 3 times at night instead hourly. On pelvic examination she had mild prolapse and no stress incontinence with a minimal cystocele.   Cystoscopy normal-reassess 4 to 5 months on trimethoprim  suppression therapy.  Clinically infection free.  She had some discomfort a month ago that went away with pushing fluids.  She is now getting up several times a night again.  Clinically not infected.   Pathophysiology of nocturia discussed.  She does have some ankle edema.  I think we need to have reasonable treatment goals.  Depending on her goals percutaneous tibial nerve stimulation may also be helpful.  Stay on trimethoprim .  Call if culture positive.  Reassess on Gemtesa  samples and prescription for 6 weeks.  Polypharmacy discussed.  Reassess in 6 weeks on Gemtesa .    Patient now voiding 3 times a day but the prescription is $200.  Clinically not infected.  Very pleased but is hoping to have a less expensive drug  Reassess in 6 weeks on oxybutynin  ER 10 mg 30 x 11   Today Last urine culture was positive and the 2 prior on prophylaxis were negative Most nights gets up 3 times on  oxybutynin  and is pleased.  Clinically not infected.      PMH: Past Medical History:  Diagnosis Date   Acute GI bleeding 04/18/2023   Adenomatous polyps    Allergic state    Anemia    Anginal pain (HCC)    Anticoagulated 04/18/2023   Anxiety    Arthritis    osteoarthritis   Benign neoplasm of colon 04/21/2023   Broken rib    left   C. difficile diarrhea 10/21/2021   Cancer (HCC)    skin   CHF (congestive heart failure) (HCC)    Chicken pox    Complication of anesthesia    respiratory distress after rectocele surgery   Coronary artery disease    Depression    Eczema    Fibromyalgia    Gastric polyp 04/19/2023   GERD (gastroesophageal reflux disease)    GI bleed 04/18/2023   Headache    migraines   Hemorrhoids    History  of hiatal hernia    Hypercalcemia    Hyperlipidemia    Hypertension    Hypothyroidism    Lumbar spondylosis 06/01/2017   Lumbar stenosis    Melena 04/19/2023   Obesity    Peptic ulcer disease    Shortness of breath dyspnea    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 Kathi, [facility], Spinal Fusion   CARDIAC CATHETERIZATION N/A 04/03/2015   Procedure: Left Heart Cath and Coronary Angiography;  Surgeon: Vinie DELENA Jude, MD;  Location: [facility] INVASIVE CV LAB;  Service: Cardiovascular;  Laterality: N/A;   CATARACT EXTRACTION W/PHACO Left 04/22/2019   Procedure: CATARACT EXTRACTION PHACO AND INTRAOCULAR LENS PLACEMENT (IOC) LEFT  00:45.4  12.9%  6.03;  Surgeon: Myrna Adine Anes, MD;  Location: [facility] SURGERY CNTR;  Service: Ophthalmology;  Laterality: Left;  Latex sleep apnea   CATARACT EXTRACTION W/PHACO Right 07/01/2019   Procedure: CATARACT EXTRACTION PHACO AND INTRAOCULAR LENS PLACEMENT (IOC) RIGHT 2.38  00:24.4;  Surgeon: Myrna Adine Anes, MD;  Location: [facility] SURGERY CNTR;  Service: Ophthalmology;  Laterality: Right;  Latex Sleep apnea   CHOLECYSTECTOMY     COLONOSCOPY WITH PROPOFOL  N/A 04/21/2023   Procedure: COLONOSCOPY WITH PROPOFOL ;  Surgeon: Leigh Elspeth SQUIBB, MD;  Location: [facility] ENDOSCOPY;  Service: Gastroenterology;  Laterality: N/A;   CORONARY ANGIOPLASTY  2015   Dr. Jude, [facility] Cath Lab   ESOPHAGOGASTRODUODENOSCOPY (EGD) WITH PROPOFOL  N/A 12/21/2016   Procedure: ESOPHAGOGASTRODUODENOSCOPY (EGD) WITH PROPOFOL ;  Surgeon: Viktoria Lamar DASEN, MD;  Location: [facility] ENDOSCOPY;  Service: Endoscopy;  Laterality: N/A;   ESOPHAGOGASTRODUODENOSCOPY (EGD) WITH PROPOFOL  N/A 12/08/2022   Procedure: ESOPHAGOGASTRODUODENOSCOPY (EGD) WITH PROPOFOL ;  Surgeon: Onita Elspeth Sharper,  DO;  Location: [facility] ENDOSCOPY;  Service: Gastroenterology;  Laterality: N/A;   ESOPHAGOGASTRODUODENOSCOPY (EGD) WITH PROPOFOL  N/A 04/19/2023   Procedure: ESOPHAGOGASTRODUODENOSCOPY (EGD) WITH PROPOFOL ;  Surgeon: Leigh Elspeth SQUIBB, MD;  Location: [facility] ENDOSCOPY;  Service: Gastroenterology;  Laterality: N/A;   EYE SURGERY     FRACTURE SURGERY Left 1991   Fractures Femur, [facility]   GIVENS CAPSULE STUDY N/A 06/22/2023   Procedure: GIVENS CAPSULE STUDY;  Surgeon: Jinny Carmine, MD;  Location: [facility] ENDOSCOPY;  Service: Endoscopy;  Laterality: N/A;   HEMOSTASIS CLIP PLACEMENT  04/19/2023   Procedure: HEMOSTASIS CLIP  PLACEMENT;  Surgeon: Leigh Elspeth SQUIBB, MD;  Location: [facility] ENDOSCOPY;  Service: Gastroenterology;;   HIP ARTHROPLASTY Right 05/07/2020   Procedure: ARTHROPLASTY RIGHT HIP (HEMIARTHROPLASTY);  Surgeon: Marchia Drivers, MD;  Location: [facility] ORS;  Service: Orthopedics;  Laterality: Right;   IMAGE GUIDED SINUS SURGERY Bilateral 09/28/2015   Procedure: IMAGE GUIDED SINUS SURGERY, SEPTOPLASTY, BILATERAL FRONTAL SINUSOTOMIES, BILATERAL MAXILLARY ANTROSTOMIES, BILATERAL TOTAL ETHMOIDECTOMY, BILATERAL SPHENOIDECTOMY, BILATERAL INFERIOR TURBINATE REDUCTION;  Surgeon: Deward Argue, MD;  Location: [facility] ORS;  Service: ENT;  Laterality: Bilateral;   LEFT HEART CATH AND CORONARY ANGIOGRAPHY Left 01/19/2023   Procedure: LEFT HEART CATH AND CORONARY ANGIOGRAPHY;  Surgeon: Ammon Blunt, MD;  Location: [facility] INVASIVE CV LAB;  Service: Cardiovascular;  Laterality: Left;   POLYPECTOMY  04/19/2023   Procedure: POLYPECTOMY;  Surgeon: Leigh Elspeth SQUIBB, MD;  Location: [facility] ENDOSCOPY;  Service: Gastroenterology;;   POLYPECTOMY  04/21/2023   Procedure: POLYPECTOMY;  Surgeon: Leigh Elspeth SQUIBB, MD;  Location: [facility] ENDOSCOPY;  Service: Gastroenterology;;   RECTOCELE REPAIR      Home Medications:  Allergies as of 03/18/2024       Reactions   Azithromycin Diarrhea, Rash   fainting Other  reaction(s): Syncope  fainting    Other reaction(s): NOT KNOWN  fainting   Meloxicam Diarrhea   Morphine  Other (See Comments), Rash   Other reaction(s): Unknown Retain urine Other reaction(s): NOT KNOWN, Other (See Comments) Retain urine Other reaction(s): side effect of severe rash   Adhesive [tape]    blisters   Codeine Nausea And Vomiting   Lactose Intolerance (gi) Other (See Comments)   stomach pain   Red Dye #40 (allura Red) Itching   red food dye   Shellfish-derived Products Itching, Swelling   Aspirin  Rash   Latex Rash   With extended contact   Lodine [etodolac] Rash   Morphine  And Codeine Hives, Nausea And Vomiting   Other reaction(s): UNKNOWN   Penicillins Rash, Hives   Given on 05/07/20.  No reaction noted.   Pravastatin Rash        Medication List        Accurate as of March 18, 2024  9:51 AM. If you have any questions, ask your nurse or doctor.          acetaminophen  500 MG tablet Commonly known as: TYLENOL  Take 500 mg by mouth every 6 (six) hours as needed.   ADVAIR HFA IN Inhale 1 puff into the lungs daily as needed.   albuterol  108 (90 Base) MCG/ACT inhaler Commonly known as: VENTOLIN  HFA Inhale 2 puffs into the lungs every 6 (six) hours as needed for wheezing or shortness of breath.   amLODipine  5 MG tablet Commonly known as: NORVASC  Take 1 tablet (5 mg total) by mouth daily. for blood pressure.   carboxymethylcellulose 0.5 % Soln Commonly known as: REFRESH PLUS Place 1 drop into both eyes daily as needed (dry eyes).   citalopram  20 MG tablet Commonly known as: CELEXA  Take 20 mg by mouth daily.   clonazePAM  1 MG tablet Commonly known as: KLONOPIN  Take 1 mg by mouth at bedtime.   fluticasone  50 MCG/ACT nasal spray Commonly known as: FLONASE  Place into both nostrils.   gabapentin  600 MG tablet Commonly known as: NEURONTIN  Take 600-1,200 mg by mouth See admin instructions. Take 1 tablet (600mg ) by mouth in the  morning and 2 tablets (1200mg ) by mouth at bedtime   hydrOXYzine  10 MG tablet Commonly known as: ATARAX  Take 1-2 tablets (10-20 mg total) by mouth at bedtime as  needed for anxiety.   levothyroxine  125 MCG tablet Commonly known as: SYNTHROID  Take 1 tablet by mouth every morning on an empty stomach with water only.  No food or other medications for 30 minutes.   omeprazole  40 MG capsule Commonly known as: PRILOSEC Take 1 capsule (40 mg total) by mouth 2 (two) times daily before a meal.   ondansetron  4 MG disintegrating tablet Commonly known as: ZOFRAN -ODT Take 1 tablet (4 mg total) by mouth every 8 (eight) hours as needed for nausea or vomiting.   oxybutynin  10 MG 24 hr tablet Commonly known as: DITROPAN -XL Take 1 tablet (10 mg total) by mouth daily.   tiZANidine  4 MG tablet Commonly known as: ZANAFLEX  Take 1 tablet (4 mg total) by mouth every 8 (eight) hours as needed for muscle spasms.   trimethoprim  100 MG tablet Commonly known as: TRIMPEX  Take 1 tablet (100 mg total) by mouth daily.        Allergies:  Allergies  Allergen Reactions   Azithromycin Diarrhea and Rash    fainting  Other reaction(s): Syncope  fainting    Other reaction(s): NOT KNOWN  fainting   Meloxicam Diarrhea   Morphine  Other (See Comments) and Rash    Other reaction(s): Unknown Retain urine Other reaction(s): NOT KNOWN, Other (See Comments) Retain urine Other reaction(s): side effect of severe rash   Adhesive [Tape]     blisters   Codeine Nausea And Vomiting   Lactose Intolerance (Gi) Other (See Comments)    stomach pain   Red Dye #40 (Allura Red) Itching    red food dye   Shellfish-Derived Products Itching and Swelling   Aspirin  Rash   Latex Rash    With extended contact   Lodine [Etodolac] Rash   Morphine  And Codeine Hives and Nausea And Vomiting    Other reaction(s): UNKNOWN   Penicillins Rash and Hives    Given on 05/07/20.  No reaction noted.   Pravastatin Rash     Family History: Family History  Problem Relation Age of Onset   Hypertension Mother    Cancer Mother    Diabetes Father    Hypertension Father    Heart failure Father    Prostate cancer Brother    Breast cancer Neg Hx     Social History:  reports that she quit smoking about 25 years ago. Her smoking use included cigarettes. She has been exposed to tobacco smoke. She has never used smokeless tobacco. She reports that she does not drink alcohol  and does not use drugs.  ROS:                                        Physical Exam: There were no vitals taken for this visit.  Constitutional:  Alert and oriented, No acute distress. HEENT: [place] AT, moist mucus membranes.  Trachea midline, no masses.  Laboratory Data: Lab Results  Component Value Date   WBC 8.4 03/13/2024   HGB 12.7 03/13/2024   HCT 38.6 03/13/2024   MCV 90.2 03/13/2024   PLT 226 03/13/2024    Lab Results  Component Value Date   CREATININE 0.81 10/21/2023    No results found for: PSA  No results found for: TESTOSTERONE  Lab Results  Component Value Date   HGBA1C 5.0 08/09/2023    Urinalysis    Component Value Date/Time   COLORURINE STRAW (A) 04/17/2023 1808   APPEARANCEUR Clear  02/05/2024 1405   LABSPEC 1.010 04/17/2023 1808   LABSPEC 1.005 07/23/2014 1444   PHURINE 7.0 04/17/2023 1808   GLUCOSEU Negative 02/05/2024 1405   GLUCOSEU NEGATIVE 07/23/2014 1444   HGBUR NEGATIVE 04/17/2023 1808   BILIRUBINUR Negative 02/05/2024 1405   BILIRUBINUR NEGATIVE 07/23/2014 1444   KETONESUR NEGATIVE 04/17/2023 1808   PROTEINUR Negative 02/05/2024 1405   PROTEINUR NEGATIVE 04/17/2023 1808   NITRITE Negative 02/05/2024 1405   NITRITE NEGATIVE 04/17/2023 1808   LEUKOCYTESUR Negative 02/05/2024 1405   LEUKOCYTESUR NEGATIVE 04/17/2023 1808   LEUKOCYTESUR NEGATIVE 07/23/2014 1444    Pertinent Imaging: The patient will leave a sample will send for culture  Assessment & Plan: 90  x 3 trimethoprim  and oxybutynin  sent to pharmacy and I will see in 1 year.  Call if culture positive.  Patient was a bit upset because she thought staff was rushing her to urinate  1. Overactive bladder (Primary)  - Urinalysis, Complete   No follow-ups on file.  Stacy DELENA Elizabeth, MD  [facility] [address], [address] [place], [place] [address] [phone]

## 2024-03-20 DIAGNOSIS — H524 Presbyopia: Secondary | ICD-10-CM | POA: Diagnosis not present

## 2024-03-20 LAB — CULTURE, URINE COMPREHENSIVE

## 2024-03-22 ENCOUNTER — Telehealth: Payer: Self-pay

## 2024-03-22 NOTE — Telephone Encounter (Signed)
 Voicemail received from patient today 03/22/24 at 8:16am indicating she is scheduled for infusions on Oct 1st & 8th and was unware.  Patient requested a call back at [phone] to explain.  Patient is scheduled for IV Venofer  x 3 on 10/1, 10/8, and 10/10; at last OV 03/13/24 iron  studies were still pending. Outbound call to patient; informed of above.  Reviewed time for all 3 IV irons is at 1:15pm; patient verbalized understanding

## 2024-03-25 ENCOUNTER — Ambulatory Visit: Admitting: Oncology

## 2024-03-25 ENCOUNTER — Other Ambulatory Visit

## 2024-03-25 DIAGNOSIS — Z0131 Encounter for examination of blood pressure with abnormal findings: Secondary | ICD-10-CM | POA: Diagnosis not present

## 2024-03-25 DIAGNOSIS — L988 Other specified disorders of the skin and subcutaneous tissue: Secondary | ICD-10-CM | POA: Diagnosis not present

## 2024-03-25 DIAGNOSIS — I10 Essential (primary) hypertension: Secondary | ICD-10-CM | POA: Diagnosis not present

## 2024-03-25 DIAGNOSIS — D649 Anemia, unspecified: Secondary | ICD-10-CM | POA: Diagnosis not present

## 2024-03-25 DIAGNOSIS — Z1389 Encounter for screening for other disorder: Secondary | ICD-10-CM | POA: Diagnosis not present

## 2024-03-25 DIAGNOSIS — Z23 Encounter for immunization: Secondary | ICD-10-CM | POA: Diagnosis not present

## 2024-03-27 ENCOUNTER — Inpatient Hospital Stay: Attending: Oncology

## 2024-03-27 VITALS — BP 133/70 | HR 73 | Temp 97.0°F | Resp 18

## 2024-03-27 DIAGNOSIS — D508 Other iron deficiency anemias: Secondary | ICD-10-CM

## 2024-03-27 DIAGNOSIS — D509 Iron deficiency anemia, unspecified: Secondary | ICD-10-CM | POA: Diagnosis present

## 2024-03-27 MED ORDER — IRON SUCROSE 20 MG/ML IV SOLN
200.0000 mg | INTRAVENOUS | Status: DC
  Administered 2024-03-27: 200 mg via INTRAVENOUS
  Filled 2024-03-27: qty 10

## 2024-03-27 NOTE — Patient Instructions (Signed)

## 2024-03-28 ENCOUNTER — Other Ambulatory Visit: Payer: Self-pay

## 2024-03-28 ENCOUNTER — Telehealth: Payer: Self-pay

## 2024-03-28 DIAGNOSIS — N3281 Overactive bladder: Secondary | ICD-10-CM

## 2024-03-28 MED ORDER — OXYBUTYNIN CHLORIDE ER 10 MG PO TB24: 10.0000 mg | ORAL_TABLET | Freq: Every day | ORAL | 11 refills | Status: AC

## 2024-03-28 NOTE — Telephone Encounter (Signed)
 LVM for pt to return call.   Confirming pt wants a Rx for oxybutynin  [facility]

## 2024-03-29 ENCOUNTER — Other Ambulatory Visit: Payer: Self-pay | Admitting: Primary Care

## 2024-03-29 DIAGNOSIS — Z889 Allergy status to unspecified drugs, medicaments and biological substances status: Secondary | ICD-10-CM

## 2024-03-29 DIAGNOSIS — J3089 Other allergic rhinitis: Secondary | ICD-10-CM

## 2024-04-01 DIAGNOSIS — L578 Other skin changes due to chronic exposure to nonionizing radiation: Secondary | ICD-10-CM | POA: Diagnosis not present

## 2024-04-01 DIAGNOSIS — L814 Other melanin hyperpigmentation: Secondary | ICD-10-CM | POA: Diagnosis not present

## 2024-04-01 DIAGNOSIS — C44629 Squamous cell carcinoma of skin of left upper limb, including shoulder: Secondary | ICD-10-CM | POA: Diagnosis not present

## 2024-04-01 DIAGNOSIS — D0461 Carcinoma in situ of skin of right upper limb, including shoulder: Secondary | ICD-10-CM | POA: Diagnosis not present

## 2024-04-02 DIAGNOSIS — G8929 Other chronic pain: Secondary | ICD-10-CM | POA: Diagnosis not present

## 2024-04-02 DIAGNOSIS — M5416 Radiculopathy, lumbar region: Secondary | ICD-10-CM | POA: Diagnosis not present

## 2024-04-02 DIAGNOSIS — M5441 Lumbago with sciatica, right side: Secondary | ICD-10-CM | POA: Diagnosis not present

## 2024-04-02 DIAGNOSIS — M5442 Lumbago with sciatica, left side: Secondary | ICD-10-CM | POA: Diagnosis not present

## 2024-04-02 DIAGNOSIS — M25551 Pain in right hip: Secondary | ICD-10-CM | POA: Diagnosis not present

## 2024-04-03 ENCOUNTER — Inpatient Hospital Stay

## 2024-04-03 VITALS — BP 150/88 | HR 64 | Temp 97.9°F | Resp 18

## 2024-04-03 DIAGNOSIS — D509 Iron deficiency anemia, unspecified: Secondary | ICD-10-CM | POA: Diagnosis not present

## 2024-04-03 DIAGNOSIS — D508 Other iron deficiency anemias: Secondary | ICD-10-CM

## 2024-04-03 MED ORDER — IRON SUCROSE 20 MG/ML IV SOLN
200.0000 mg | INTRAVENOUS | Status: DC
  Administered 2024-04-03: 200 mg via INTRAVENOUS

## 2024-04-03 MED ORDER — SODIUM CHLORIDE 0.9% FLUSH
10.0000 mL | Freq: Once | INTRAVENOUS | Status: AC | PRN
  Administered 2024-04-03: 10 mL
  Filled 2024-04-03: qty 10

## 2024-04-05 ENCOUNTER — Inpatient Hospital Stay

## 2024-04-05 VITALS — BP 125/70 | HR 67 | Temp 97.4°F | Resp 16

## 2024-04-05 DIAGNOSIS — D508 Other iron deficiency anemias: Secondary | ICD-10-CM

## 2024-04-05 DIAGNOSIS — D509 Iron deficiency anemia, unspecified: Secondary | ICD-10-CM | POA: Diagnosis not present

## 2024-04-05 MED ORDER — IRON SUCROSE 20 MG/ML IV SOLN
200.0000 mg | INTRAVENOUS | Status: DC
  Administered 2024-04-05: 200 mg via INTRAVENOUS
  Filled 2024-04-05: qty 10

## 2024-04-05 NOTE — Patient Instructions (Signed)

## 2024-04-08 ENCOUNTER — Other Ambulatory Visit: Payer: Self-pay

## 2024-04-08 ENCOUNTER — Emergency Department
Admission: EM | Admit: 2024-04-08 | Discharge: 2024-04-08 | Disposition: A | Discharge status: Home / Self Care | Source: Ambulatory Visit | Attending: Emergency Medicine | Admitting: Emergency Medicine

## 2024-04-08 DIAGNOSIS — H539 Unspecified visual disturbance: Secondary | ICD-10-CM | POA: Diagnosis not present

## 2024-04-08 DIAGNOSIS — H5789 Other specified disorders of eye and adnexa: Secondary | ICD-10-CM | POA: Diagnosis not present

## 2024-04-08 DIAGNOSIS — R21 Rash and other nonspecific skin eruption: Secondary | ICD-10-CM | POA: Diagnosis not present

## 2024-04-08 DIAGNOSIS — Z77098 Contact with and (suspected) exposure to other hazardous, chiefly nonmedicinal, chemicals: Secondary | ICD-10-CM | POA: Diagnosis not present

## 2024-04-08 MED ORDER — TETRACAINE HCL 0.5 % OP SOLN
1.0000 [drp] | Freq: Once | OPHTHALMIC | Status: AC
  Administered 2024-04-08: 1 [drp] via OPHTHALMIC
  Filled 2024-04-08: qty 4

## 2024-04-08 MED ORDER — POLYMYXIN B-TRIMETHOPRIM 10000-0.1 UNIT/ML-% OP SOLN: 2.0000 [drp] | Freq: Four times a day (QID) | OPHTHALMIC | 0 refills | Status: AC

## 2024-04-08 MED ORDER — FLUORESCEIN SODIUM 1 MG OP STRP
1.0000 | ORAL_STRIP | Freq: Once | OPHTHALMIC | Status: AC
  Administered 2024-04-08: 1 via OPHTHALMIC
  Filled 2024-04-08: qty 1

## 2024-04-08 NOTE — ED Notes (Signed)
 Pt to ED from [facility]-in for Bilateral eye redness. Pt reports using Retin-A cream on her face Saturday and thinks that it got in her eyes off her pillow. Walk-in called poison control who stated corneal burns needed to be ruled out.   Visual Acuity done at Walk-in:  B/L: 20/50 L 20/50 R 20/200

## 2024-04-08 NOTE — Discharge Instructions (Addendum)
 Discontinue tretinoin cream.  You should be very cautious with tretinoin around the eyes, as it can cause significant irritation to the thin, sensitive eyelid skin  Take a break: Temporarily stop using tretinoin for a few nights to let your skin barrier recover.  Moisturize generously: Apply a thick, fragrance-free moisturizer or barrier cream to soothe the skin and help it heal such as CeraVe, Aquaphor etc  Use cool compresses: Apply a cool (not frozen) compress wrapped in a towel to the irritated areas for about 5 minutes to help with burning or itching.  Protect your skin: Be extra diligent about applying sunscreen and wearing protective clothing, as tretinoin increases sun sensitivity.  Simplify your routine: Avoid other potentially irritating products, such as harsh exfoliants or cleansers.   Follow-up with ophthalmology

## 2024-04-08 NOTE — ED Provider Notes (Signed)
 [facility] Emergency Department Provider Note     Event Date/Time   First MD Initiated Contact with Patient 04/08/24 1142     (approximate)   History   Eye Pain   HPI  Stacy Allen is a 83 y.o. female presents to the ED for evaluation of periorbital skin and eye redness, burning and itching patient reports started after applying tretinoin cream over her face provided by her PCP and believes cream has answered her eyes.  Patient reports vision slightly diminished but has improved since morning evaluation at [facility].  No other complaint.     Physical Exam   Triage Vital Signs: ED Triage Vitals  Encounter Vitals Group     BP 04/08/24 1126 (!) 143/88     Girls Systolic BP Percentile --      Girls Diastolic BP Percentile --      Boys Systolic BP Percentile --      Boys Diastolic BP Percentile --      Pulse Rate 04/08/24 1126 83     Resp 04/08/24 1126 20     Temp 04/08/24 1126 98.1 F (36.7 C)     Temp Source 04/08/24 1126 Oral     SpO2 04/08/24 1126 96 %     Weight 04/08/24 1128 194 lb (88 kg)     Height 04/08/24 1128 5' (1.524 m)     Head Circumference --      Peak Flow --      Pain Score 04/08/24 1130 5     Pain Loc --      Pain Education --      Exclude from Growth Chart --     Most recent vital signs: Vitals:   04/08/24 1126  BP: (!) 143/88  Pulse: 83  Resp: 20  Temp: 98.1 F (36.7 C)  SpO2: 96%   General Awake, no distress.  HEENT NCAT.  CV:  Good peripheral perfusion.  RESP:  Normal effort.  ABD:  No distention.  Other:  Bilateral conjunctival injected noted.  No purulent discharge.  Corneas clear.  No foreign body or abrasion on fluorescein exam.  Pupils equal, round and reactive to light.  EOC movement intact and nonpainful.  Eyelids and periorbital skin erythematous and mildly edematous with superficial irritation and thinning of the skin, consistent with contact or chemical dermatitis  IOP: Right-48mmHg,  Left-29mmHg   ED Results / Procedures / Treatments   Labs (all labs ordered are listed, but only abnormal results are displayed) Labs Reviewed - No data to display  No results found.  PROCEDURES:  Critical Care performed: No  Procedures   MEDICATIONS ORDERED IN ED: Medications  tetracaine  (PONTOCAINE) 0.5 % ophthalmic solution 1-2 drop (1 drop Right Eye Given by Other 04/08/24 1250)  fluorescein ophthalmic strip 1 strip (1 strip Right Eye Given by Other 04/08/24 1249)     IMPRESSION / MDM / ASSESSMENT AND PLAN / ED COURSE  I reviewed the triage vital signs and the nursing notes.                               83 y.o. female presents to the emergency department for evaluation and treatment of bilateral eye irritation and periorbital rash. See HPI for further details.   Differential diagnosis includes, but is not limited to conjunctivitis, corneal abrasion, subconjunctival hemorrhage, dermatitis-chemical versus contact  [name] presentation is most consistent with acute complicated illness /  injury requiring diagnostic workup.  Patient with bilateral eye irritation.  No evidence of corneal abrasion or foreign body on fluorescein exam test.  IOP pressures reassuring.  Both eyes were irrigated with normal saline with Joesph lens in ED.  Condition appears superficial and localized periorbital skin.  Applied warm compresses in ED that did provide some relief for patient.  Advised patient to discontinue tretinoin.  Advised supportive care such as cool compresses and gentle skin care with CeraVe a lotion and barrier creams discussed.  Advised patient with close follow-up with ophthalmology for further evaluation.  ED return precaution discussed.  Patient stable condition for discharge home.  She is in agreement with this care plan.  FINAL CLINICAL IMPRESSION(S) / ED DIAGNOSES   Final diagnoses:  Eye irritation  Rash in adult   Rx / DC Orders   ED Discharge Orders           Ordered    trimethoprim -polymyxin b  (POLYTRIM) ophthalmic solution  Every 6 hours        04/08/24 1412             Note:  This document was prepared using Dragon voice recognition software and may include unintentional dictation errors.    Margrette, Amaira Safley A, PA-C 04/08/24 DANIAL    Suzanne Kirsch, MD 04/09/24 1601

## 2024-04-08 NOTE — ED Triage Notes (Signed)
 Pt comes with bilateral eye pain for two days. Pt states redness and itching and pain. Pt states she might have gotten cream in them that she was using on her face.

## 2024-04-09 DIAGNOSIS — H10213 Acute toxic conjunctivitis, bilateral: Secondary | ICD-10-CM | POA: Diagnosis not present

## 2024-04-10 ENCOUNTER — Ambulatory Visit: Admitting: Clinical

## 2024-04-10 DIAGNOSIS — F330 Major depressive disorder, recurrent, mild: Secondary | ICD-10-CM

## 2024-04-10 DIAGNOSIS — F419 Anxiety disorder, unspecified: Secondary | ICD-10-CM

## 2024-04-10 NOTE — Progress Notes (Signed)
   Darice Seats, LCSW
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
## 2024-04-17 DIAGNOSIS — L244 Irritant contact dermatitis due to drugs in contact with skin: Secondary | ICD-10-CM | POA: Diagnosis not present

## 2024-04-17 DIAGNOSIS — D0461 Carcinoma in situ of skin of right upper limb, including shoulder: Secondary | ICD-10-CM | POA: Diagnosis not present

## 2024-04-22 ENCOUNTER — Ambulatory Visit: Admitting: Urology

## 2024-04-26 ENCOUNTER — Encounter: Payer: Self-pay | Admitting: Oncology

## 2024-04-29 DIAGNOSIS — D649 Anemia, unspecified: Secondary | ICD-10-CM | POA: Diagnosis not present

## 2024-04-29 DIAGNOSIS — E039 Hypothyroidism, unspecified: Secondary | ICD-10-CM | POA: Diagnosis not present

## 2024-04-29 DIAGNOSIS — E2740 Unspecified adrenocortical insufficiency: Secondary | ICD-10-CM | POA: Diagnosis not present

## 2024-04-30 ENCOUNTER — Encounter: Payer: Self-pay | Admitting: Nurse Practitioner

## 2024-05-02 ENCOUNTER — Emergency Department (HOSPITAL_BASED_OUTPATIENT_CLINIC_OR_DEPARTMENT_OTHER)
Admission: EM | Admit: 2024-05-02 | Discharge: 2024-05-02 | Disposition: A | Discharge status: Home / Self Care | Source: Ambulatory Visit

## 2024-05-02 ENCOUNTER — Encounter (HOSPITAL_BASED_OUTPATIENT_CLINIC_OR_DEPARTMENT_OTHER): Payer: Self-pay | Admitting: Emergency Medicine

## 2024-05-02 ENCOUNTER — Other Ambulatory Visit: Payer: Self-pay

## 2024-05-02 DIAGNOSIS — E871 Hypo-osmolality and hyponatremia: Secondary | ICD-10-CM | POA: Diagnosis not present

## 2024-05-02 LAB — BASIC METABOLIC PANEL WITH GFR
Anion gap: 12 (ref 5–15)
BUN: 16 mg/dL (ref 8–23)
CO2: 23 mmol/L (ref 22–32)
Calcium: 9.2 mg/dL (ref 8.9–10.3)
Chloride: 98 mmol/L (ref 98–111)
Creatinine, Ser: 0.71 mg/dL (ref 0.44–1.00)
GFR, Estimated: >60 mL/min (ref >60)
Glucose, Bld: 99 mg/dL (ref 70–99)
Potassium: 3.8 mmol/L (ref 3.5–5.1)
Sodium: 133 mmol/L — ABNORMAL LOW (ref 135–145)

## 2024-05-02 LAB — CBC WITH DIFFERENTIAL/PLATELET
Abs Immature Granulocytes: 0.02 K/uL (ref 0.00–0.07)
Basophils Absolute: 0.1 K/uL (ref 0.0–0.1)
Basophils Relative: 1 %
Eosinophils Absolute: 0.1 K/uL (ref 0.0–0.5)
Eosinophils Relative: 1 %
HCT: 39.5 % (ref 36.0–46.0)
Hemoglobin: 13.4 g/dL (ref 12.0–15.0)
Immature Granulocytes: 0 %
Lymphocytes Relative: 21 %
Lymphs Abs: 1.4 K/uL (ref 0.7–4.0)
MCH: 29.8 pg (ref 26.0–34.0)
MCHC: 33.9 g/dL (ref 30.0–36.0)
MCV: 88 fL (ref 80.0–100.0)
Monocytes Absolute: 0.7 K/uL (ref 0.1–1.0)
Monocytes Relative: 11 %
Neutro Abs: 4.3 K/uL (ref 1.7–7.7)
Neutrophils Relative %: 66 %
Platelets: 242 K/uL (ref 150–400)
RBC: 4.49 MIL/uL (ref 3.87–5.11)
RDW: 14.7 % (ref 11.5–15.5)
WBC: 6.5 K/uL (ref 4.0–10.5)
nRBC: 0 % (ref 0.0–0.2)

## 2024-05-02 NOTE — ED Triage Notes (Signed)
 Pt was referred by PCP d/t hyponatremia. Pt was seen for CP. Denies CP today States Na+ was 130

## 2024-05-02 NOTE — ED Provider Notes (Addendum)
 Stacy Allen Provider Note   CSN: [id number] Arrival date & time: 05/02/24  1313     Patient presents with: Abnormal Labs   Stacy Allen is a 83 y.o. female.   83 year old female presents for evaluation of abnormal lab work.  Told today by her primary care doctor that her labs from Monday showed she had a sodium of 130.  States she deals with low sodium but it has not been this low before.  She states she has been lightheaded and fatigued as well.  Denies any other symptoms or concerns.        Prior to Admission medications   Medication Sig Start Date End Date Taking? Authorizing Provider  acetaminophen  (TYLENOL ) 500 MG tablet Take 500 mg by mouth every 6 (six) hours as needed.    [provider]  albuterol  (PROVENTIL  HFA;VENTOLIN  HFA) 108 (90 BASE) MCG/ACT inhaler Inhale 2 puffs into the lungs every 6 (six) hours as needed for wheezing or shortness of breath.    [provider]  amLODipine  (NORVASC ) 5 MG tablet Take 1 tablet (5 mg total) by mouth daily. for blood pressure. 10/25/23   Clark, Katherine K, NP  carboxymethylcellulose (REFRESH PLUS) 0.5 % SOLN Place 1 drop into both eyes daily as needed (dry eyes).    [provider]  citalopram  (CELEXA ) 20 MG tablet Take 20 mg by mouth daily. 11/15/23   [provider]  clonazePAM  (KLONOPIN ) 1 MG tablet Take 1 mg by mouth at bedtime.    [provider]  fluticasone  (FLONASE ) 50 MCG/ACT nasal spray Place into both nostrils. 09/14/23   [provider]  Fluticasone -Salmeterol (ADVAIR HFA IN) Inhale 1 puff into the lungs daily as needed.    [provider]  gabapentin  (NEURONTIN ) 600 MG tablet Take 600-1,200 mg by mouth See admin instructions. Take 1 tablet (600mg ) by mouth in the morning and 2 tablets (1200mg ) by mouth at bedtime 04/24/20   [provider]  levothyroxine  (SYNTHROID ) 125 MCG tablet Take 1 tablet by mouth every  morning on an empty stomach with water only.  No food or other medications for 30 minutes. 09/18/23   Clark, Katherine K, NP  omeprazole  (PRILOSEC) 40 MG capsule Take 1 capsule (40 mg total) by mouth 2 (two) times daily before a meal. 06/23/23   Marsa Edelman, DO  oxybutynin  (DITROPAN -XL) 10 MG 24 hr tablet Take 1 tablet (10 mg total) by mouth daily. 03/28/24   Gaston Hamilton, MD  trimethoprim  (TRIMPEX ) 100 MG tablet Take 1 tablet (100 mg total) by mouth daily. 02/29/24   Gaston Hamilton, MD  trimethoprim -polymyxin b  (POLYTRIM) ophthalmic solution Place 2 drops into both eyes every 6 (six) hours. 04/08/24   Margrette, Myah A, PA-C    Allergies: Azithromycin, Meloxicam, Morphine , Adhesive [tape], Codeine, Lactose intolerance (gi), Red dye #40 (allura red), Shellfish protein-containing drug products, Aspirin , Latex, Lodine [etodolac], Morphine  and codeine, Penicillins, and Pravastatin    Review of Systems  Constitutional:  Positive for fatigue. Negative for chills and fever.  HENT:  Negative for ear pain and sore throat.   Eyes:  Negative for pain and visual disturbance.  Respiratory:  Negative for cough and shortness of breath.   Cardiovascular:  Negative for chest pain and palpitations.  Gastrointestinal:  Negative for abdominal pain and vomiting.  Genitourinary:  Negative for dysuria and hematuria.  Musculoskeletal:  Negative for arthralgias and back pain.  Skin:  Negative for color change and rash.  Neurological:  Positive for light-headedness. Negative for seizures and syncope.  All other systems reviewed and are negative.   Updated Vital Signs BP (!) 146/85   Pulse 65   Temp 97.8 F (36.6 C)   Resp 18   Wt 86.2 kg   SpO2 100%   BMI 37.11 kg/m   Physical Exam Vitals and nursing note reviewed.  Constitutional:      General: She is not in acute distress.    Appearance: Normal appearance. She is well-developed. She is not ill-appearing.  HENT:     Head: Normocephalic and  atraumatic.  Eyes:     Conjunctiva/sclera: Conjunctivae normal.  Cardiovascular:     Rate and Rhythm: Normal rate and regular rhythm.     Heart sounds: No murmur heard. Pulmonary:     Effort: Pulmonary effort is normal. No respiratory distress.     Breath sounds: Normal breath sounds.  Abdominal:     Palpations: Abdomen is soft.     Tenderness: There is no abdominal tenderness.  Musculoskeletal:        General: No swelling.     Cervical back: Neck supple.  Skin:    General: Skin is warm and dry.     Capillary Refill: Capillary refill takes less than 2 seconds.  Neurological:     Mental Status: She is alert.  Psychiatric:        Mood and Affect: Mood normal.     (all labs ordered are listed, but only abnormal results are displayed) Labs Reviewed  BASIC METABOLIC PANEL WITH GFR - Abnormal; Notable for the following components:      Result Value   Sodium 133 (*)    All other components within normal limits  CBC WITH DIFFERENTIAL/PLATELET    EKG: None  Radiology: No results found.   Procedures   Medications Ordered in the ED - No data to display                                  Medical Decision Making Patient here for hyponatremia.  Labs are pending at this time.  She is found to have a sodium of 133.  This is just slightly below normal.  Advise electrolyte containing solutions, to increase her salt intake and follow-up with primary care.  Problems Addressed: Hyponatremia: undiagnosed new problem with uncertain prognosis  Amount and/or Complexity of Data Reviewed External Data Reviewed: notes.    Details: Prior outpatient records reviewed and patient seen on Monday and diagnosed with hyponatremia, told her labs showed a sodium of 130 from that day Labs: ordered. Decision-making details documented in ED Course.    Details: Ordered and reviewed by me and patient with very mild hyponatremia  Risk OTC drugs. Prescription drug management.     Final diagnoses:   Hyponatremia    ED Discharge Orders     None          Gennaro Duwaine CROME, DO 05/02/24 1502    Kalei Meda L, DO 05/02/24 1547

## 2024-05-02 NOTE — ED Notes (Signed)

## 2024-05-02 NOTE — ED Notes (Signed)
 Unsuccessful IV attempts, RAC, RT FA. Pt tolerated well. Bruising noted, ice pack provided

## 2024-05-02 NOTE — Discharge Instructions (Signed)
 Drink electrolyte containing solutions and follow-up with your primary care doctor as needed.

## 2024-05-08 ENCOUNTER — Ambulatory Visit: Admitting: Clinical

## 2024-05-08 DIAGNOSIS — F419 Anxiety disorder, unspecified: Secondary | ICD-10-CM | POA: Diagnosis not present

## 2024-05-08 DIAGNOSIS — F330 Major depressive disorder, recurrent, mild: Secondary | ICD-10-CM | POA: Diagnosis not present

## 2024-05-08 NOTE — Progress Notes (Signed)
 [facility] Counselor/Therapist Progress Note  Patient ID: Stacy Allen, MRN: [id number],    Date: 05/08/2024  Time Spent: 12:33pm - 1:42pm : 69 minutes   Treatment Type: Individual Therapy  Reported Symptoms: sadness  Mental Status Exam: Appearance:  Neat and Well Groomed     Behavior: Appropriate  Motor: Normal  Speech/Language:  Clear and Coherent and Normal Rate  Affect: Tearful  Mood: sad  Thought process: normal  Thought content:   WNL  Sensory/Perceptual disturbances:   WNL  Orientation: oriented to person, place, time/date, and situation  Attention: Good  Concentration: Good  Memory: WNL  Fund of knowledge:  Good  Insight:   Good  Judgment:  Good  Impulse Control: Good   Risk Assessment: Danger to Self:  No Patient denied current suicidal ideation  Self-injurious Behavior: No Danger to Others: No Patient denied current homicidal ideation Duty to Warn:no Physical Aggression / Violence:No  Access to Firearms a concern: No  Gang Involvement:No   Subjective:  Patient stated, I wish I could have one time that I could be like everything's went well or normal in response to events since last session. Patient stated,  I am having a lot of anxiety. Patient reported having patient's camper under pended is a trigger for anxiety. Patient stated, I think it's me and reported taking statements made by others personally. Patient reported patient received a call from PCP's office instructing patient to go to the emergency room due to patient's sodium level. Patient reported feeling angry in response to physician's response in the emergency room and reported feeling physicians' response were nonchalant. 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  Patient reported a recent letter from patient's insurance provider regarding affiliation with [facility] was a trigger for recent feelings of anger. Patient stated, I have anger issues now, I  have to question every thing that is said to me. Patient stated, I feel like I'm being belittled, talked down to, maybe laughed at, not taking serious in reference to triggers for cognitive distortions. Patient stated, I feel like I don't matter to anyone. Patient stated, my brother and sister, I think I matter to them. Patient stated, they (family) never have time for me in response to facts to support patient's thought. Patient stated, I'm not a priority. Patient stated, I feel more needed financially than I do loved.   Interventions: Cognitive Behavioral Therapy. Clinician conducted session in person at clinician's office at [facility]. Reviewed events since last session and assessed for changes. Discussed triggers for recent feelings of anger. Explored and identified thoughts associated with feelings of anger. Provided psycho education related to generating alternatives and explored alternatives to recent medical experiences. Provided psycho education related to cognitive distortions and connections between thoughts and feelings. Assisted patient in generating alternatives as it relates to interactions with family members. Challenged statements/thoughts to assist patient in reframing thought. Assisted patient in challenging negative thought patterns and identifying evidence for/against thoughts. Clinician requested for homework patient continue thought record and practice identifying a positive thought for each negative thought.    Collaboration of Care: not required at this time   Diagnosis:  Mild episode of recurrent major depressive disorder (HCC)   Anxiety disorder, unspecified type     Plan: Patient is to utilize [employer] Therapy, thought re-framing, mindfulness and coping strategies to decrease symptoms associated with their diagnosis. Frequency: bi-weekly  Modality: individual      Long-term goal:   Patient  stated, I guess I want to see my being able to  recognize and appreciate what I have instead of focusing on all I've lost.  Target Date: 11/30/24  Progress: goal has been met    Short-term goal:  Write at least one positive affirmation about herself and/or patient's environment each day Target Date: 11/30/24  Progress: progressing    Practice gratitude exercises to decrease symptoms of depression Target Date: 11/30/24  Progress: progressing    Identify, challenge, and replace negative thought patterns that contribute to feelings of depression and anxiety with positive thoughts and beliefs per patient's report Target Date: 11/30/24  Progress: progressing    Develop and implement strategies to increase self care, such as, participating in water exercise class, accepting invitations to social functions Target Date: 11/30/24  Progress: progressing    verbalize positive statements regarding self and her ability to cope with life stressors Target Date: 11/30/24  Progress: progressing       Darice Seats, LCSW

## 2024-05-08 NOTE — Progress Notes (Signed)
   Darice Seats, LCSW

## 2024-05-14 ENCOUNTER — Ambulatory Visit: Admitting: Clinical

## 2024-05-14 DIAGNOSIS — F419 Anxiety disorder, unspecified: Secondary | ICD-10-CM | POA: Diagnosis not present

## 2024-05-14 DIAGNOSIS — F330 Major depressive disorder, recurrent, mild: Secondary | ICD-10-CM | POA: Diagnosis not present

## 2024-05-14 NOTE — Progress Notes (Signed)
   Darice Seats, LCSW

## 2024-05-14 NOTE — Progress Notes (Signed)
 [facility] Counselor/Therapist Progress Note  Patient ID: Stacy Allen, MRN: [id number],    Date: 05/14/2024  Time Spent: 1:30pm - 2:35pm : 65 minutes   Treatment Type: Individual Therapy  Reported Symptoms: feeling discouraged, negative self talk, negative thought patterns, difficulty staying asleep  Mental Status Exam: Appearance:  Neat and Well Groomed     Behavior: Appropriate  Motor: Normal  Speech/Language:  Clear and Coherent and Normal Rate  Affect: Flat  Mood: Discouraged per patient  Thought process: normal  Thought content:   WNL  Sensory/Perceptual disturbances:   WNL  Orientation: oriented to person, place, time/date, and situation  Attention: Good  Concentration: Good  Memory: WNL  Fund of knowledge:  Good  Insight:   Good  Judgment:  Good  Impulse Control: Good   Risk Assessment: Danger to Self:  No Patient denied current suicidal ideation  Self-injurious Behavior: No Danger to Others: No Patient denied current homicidal ideation Duty to Warn:no Physical Aggression / Violence:No  Access to Firearms a concern: No  Gang Involvement:No   Subjective:  Patient reported patient has been reminiscing today. Patient stated, I'm so up and down, I just feel so sad in reference to down days. Patient stated, wake up and everything's fine, wake up and everything's awful. Patient reported waking up at 3:30 am. Patient reported going to bed at 5:30pm/6 pm and listening to audio books while laying in bed until approximately 10 pm. Patient reported improvement in mood when patient obtains more sleep. Patient stated, when I get more sleep, I do feel a lot better. Patient reported decline in mood when sleep is decreased. Patient reported the majority of time patient wakes up at 3:30 with energy. Patient stated, I do think I have SAD (seasonal affective disorder) and reported anniversaries of loved ones' deaths and loss of relationships in November are  triggers for decline in mood. Patient reported ambivalence related to attending an upcoming holiday event/gathering. Patient stated, that's it I don't know in response to balancing pros/cons. Patient stated, I've really looked at them in reference to pros/cons of attending event and reported initial thought is negative. Patient stated, I have been and I've seen what I'm doing and my first thought is going to the negative, I'm trying to dissect it and see if that is really negative in response to patient's homework assignment. Patient reported during patient's childhood patient felt laughed at and looked down on. Patient reported negative core beliefs started at age 54. Patient stated, I do know that I have worth. Patient stated, today I am discouraged.   Interventions: Cognitive Behavioral Therapy. Clinician conducted session in person at clinician's office at [facility]. Reviewed events since last session and assessed for changes. Explored patterns in mood and patterns in sleep. Reviewed patient's sleep routine/hygiene. Provided psycho education related to healthy sleep hygiene. Discussed patient following up with PCP to discuss difficulty staying asleep. Assisted patient in exploring pros/cons of attending upcoming holiday event. Reviewed patient's homework to identify a positive thought for each negative thought. Provided psycho education related to cognitive distortions, challenging negative thought patterns, core beliefs, and opposite action. Discussed patient's core beliefs. Clinician requested for homework patient continue thought record and practice identifying a positive thought for each negative thought.    Collaboration of Care: not required at this time   Diagnosis:  Mild episode of recurrent major depressive disorder (HCC)   Anxiety disorder, unspecified type     Plan: Patient is to utilize [employer]  Therapy, thought re-framing, mindfulness and coping  strategies to decrease symptoms associated with their diagnosis. Frequency: bi-weekly  Modality: individual      Long-term goal:   Patient stated, I guess I want to see my being able to recognize and appreciate what I have instead of focusing on all I've lost.  Target Date: 11/30/24  Progress: goal has been met    Short-term goal:  Write at least one positive affirmation about herself and/or patient's environment each day Target Date: 11/30/24  Progress: progressing    Practice gratitude exercises to decrease symptoms of depression Target Date: 11/30/24  Progress: progressing    Identify, challenge, and replace negative thought patterns that contribute to feelings of depression and anxiety with positive thoughts and beliefs per patient's report Target Date: 11/30/24  Progress: progressing    Develop and implement strategies to increase self care, such as, participating in water exercise class, accepting invitations to social functions Target Date: 11/30/24  Progress: progressing    verbalize positive statements regarding self and her ability to cope with life stressors Target Date: 11/30/24  Progress: progressing     Darice Seats, LCSW

## 2024-06-03 DIAGNOSIS — N390 Urinary tract infection, site not specified: Secondary | ICD-10-CM | POA: Diagnosis not present

## 2024-06-07 ENCOUNTER — Telehealth: Payer: Self-pay

## 2024-06-07 ENCOUNTER — Ambulatory Visit
Admission: EM | Admit: 2024-06-07 | Discharge: 2024-06-07 | Disposition: A | Discharge status: Home / Self Care | Attending: Emergency Medicine | Admitting: Emergency Medicine

## 2024-06-07 DIAGNOSIS — R1030 Lower abdominal pain, unspecified: Secondary | ICD-10-CM

## 2024-06-07 DIAGNOSIS — R300 Dysuria: Secondary | ICD-10-CM

## 2024-06-07 LAB — POCT URINE DIPSTICK
Bilirubin, UA: NEGATIVE
Blood, UA: NEGATIVE
Glucose, UA: NEGATIVE mg/dL
Ketones, POC UA: NEGATIVE mg/dL
Nitrite, UA: NEGATIVE
POC PROTEIN,UA: NEGATIVE
Spec Grav, UA: 1.015 (ref 1.010–1.025)
Urobilinogen, UA: 0.2 U/dL
pH, UA: 5.5 (ref 5.0–8.0)

## 2024-06-07 NOTE — ED Triage Notes (Addendum)
 Patient to Urgent Care with complaints of urinary frequency and dysuria. Symptoms x2 weeks.  Started taking macrobid  on Saturday prescribed via e-visit. Stopped taking her trimethoprim  when she started this. Now having groin/ back pain and headaches.   Also attempted use of monistat but states this caused severe burning.

## 2024-06-07 NOTE — Discharge Instructions (Addendum)
 The urine culture is pending.    Follow-up with your urologist on Monday.  Go to the emergency department if you have worsening symptoms.

## 2024-06-07 NOTE — Telephone Encounter (Signed)
 Returned engineer, technical sales on triage line and left voicemail for patient to return our call. Pt was stating UTI symptoms on triage line.

## 2024-06-07 NOTE — ED Provider Notes (Signed)
 Stacy Allen    CSN: [id number] Arrival date & time: 06/07/24  1426      History   Chief Complaint Chief Complaint  Patient presents with   Dysuria    HPI Stacy Allen is a 83 y.o. female.  Patient presents with 2-week history of dysuria and urinary frequency.  She has chronic cystitis and is followed by urology.  She takes daily trimethoprim  for prevention.  She states she did a telehealth visit on 06/01/2024 for her symptoms and was prescribed Macrobid .  She states her symptoms have not improved.  She is having lower abdominal pain, back pain, headaches.  No fever, chills, hematuria, vomiting, unusual diarrhea (patient states this is her baseline), constipation.last bowel movement yesterday.  Patient was last seen by her urologist on 12/18/2023 for chronic cystitis.  The history is provided by the patient and medical records.    Past Medical History:  Diagnosis Date   Acute GI bleeding 04/18/2023   Adenomatous polyps    Allergic state    Anemia    Anginal pain    Anticoagulated 04/18/2023   Anxiety    Arthritis    osteoarthritis   Benign neoplasm of colon 04/21/2023   Broken rib    left   C. difficile diarrhea 10/21/2021   Cancer (HCC)    skin   CHF (congestive heart failure) (HCC)    Chicken pox    Complication of anesthesia    respiratory distress after rectocele surgery   Coronary artery disease    Depression    Eczema    Fibromyalgia    Gastric polyp 04/19/2023   GERD (gastroesophageal reflux disease)    GI bleed 04/18/2023   Headache    migraines   Hemorrhoids    History of hiatal hernia    Hypercalcemia    Hyperlipidemia    Hypertension    Hypothyroidism    Lumbar spondylosis 06/01/2017   Lumbar stenosis    Melena 04/19/2023   Obesity    Peptic ulcer disease    Shortness of breath dyspnea    SI joint arthritis 06/01/2017   Sleep apnea    No CPAP   Symptomatic anemia 04/18/2023   Vertigo    Vitamin D  deficiency     Patient  Active Problem List   Diagnosis Date Noted   COPD (chronic obstructive pulmonary disease) (HCC) 08/09/2023   Recurrent UTI 08/09/2023   History of GI bleed 08/09/2023   Iron  deficiency anemia 07/26/2023   History of DVT (deep vein thrombosis) 04/18/2023   GAD (generalized anxiety disorder) 04/18/2023   Obesity, Class III, BMI 40-49.9 (morbid obesity) (HCC) 04/18/2023   Generalized weakness 10/21/2021   Closed right hip fracture (HCC) 05/06/2020   Essential hypertension 05/06/2020   Hypothyroidism 05/06/2020   Lumbar degenerative disc disease 06/01/2017   History of lumbar fusion 06/01/2017   Age related osteoporosis 06/01/2017   Chronic pain syndrome 06/01/2017    Past Surgical History:  Procedure Laterality Date   ABDOMINAL HYSTERECTOMY     BACK SURGERY  2007   Dr. Kathi, [facility], Spinal Fusion   CARDIAC CATHETERIZATION N/A 04/03/2015   Procedure: Left Heart Cath and Coronary Angiography;  Surgeon: Vinie DELENA Jude, MD;  Location: [facility] INVASIVE CV LAB;  Service: Cardiovascular;  Laterality: N/A;   CATARACT EXTRACTION W/PHACO Left 04/22/2019   Procedure: CATARACT EXTRACTION PHACO AND INTRAOCULAR LENS PLACEMENT (IOC) LEFT  00:45.4  12.9%  6.03;  Surgeon: Myrna Adine Anes, MD;  Location: [facility];  Service:  Ophthalmology;  Laterality: Left;  Latex sleep apnea   CATARACT EXTRACTION W/PHACO Right 07/01/2019   Procedure: CATARACT EXTRACTION PHACO AND INTRAOCULAR LENS PLACEMENT (IOC) RIGHT 2.38  00:24.4;  Surgeon: Myrna Adine Anes, MD;  Location: [facility];  Service: Ophthalmology;  Laterality: Right;  Latex Sleep apnea   CHOLECYSTECTOMY     COLONOSCOPY WITH PROPOFOL  N/A 04/21/2023   Procedure: COLONOSCOPY WITH PROPOFOL ;  Surgeon: Leigh Elspeth SQUIBB, MD;  Location: [facility]COPY;  Service: Gastroenterology;  Laterality: N/A;   CORONARY ANGIOPLASTY  2015   Dr. Bosie, [facility]   ESOPHAGOGASTRODUODENOSCOPY (EGD) WITH PROPOFOL  N/A 12/21/2016   Procedure:  ESOPHAGOGASTRODUODENOSCOPY (EGD) WITH PROPOFOL ;  Surgeon: Viktoria Lamar DASEN, MD;  Location: [facility] ENDOSCOPY;  Service: Endoscopy;  Laterality: N/A;   ESOPHAGOGASTRODUODENOSCOPY (EGD) WITH PROPOFOL  N/A 12/08/2022   Procedure: ESOPHAGOGASTRODUODENOSCOPY (EGD) WITH PROPOFOL ;  Surgeon: Onita Elspeth Sharper, DO;  Location: [facility] ENDOSCOPY;  Service: Gastroenterology;  Laterality: N/A;   ESOPHAGOGASTRODUODENOSCOPY (EGD) WITH PROPOFOL  N/A 04/19/2023   Procedure: ESOPHAGOGASTRODUODENOSCOPY (EGD) WITH PROPOFOL ;  Surgeon: Leigh Elspeth SQUIBB, MD;  Location: [facility] ENDOSCOPY;  Service: Gastroenterology;  Laterality: N/A;   EYE SURGERY     FRACTURE SURGERY Left 1991   Fractures Femur, [facility]   GIVENS CAPSULE STUDY N/A 06/22/2023   Procedure: GIVENS CAPSULE STUDY;  Surgeon: Jinny Carmine, MD;  Location: [facility] ENDOSCOPY;  Service: Endoscopy;  Laterality: N/A;   HEMOSTASIS CLIP PLACEMENT  04/19/2023   Procedure: HEMOSTASIS CLIP PLACEMENT;  Surgeon: Leigh Elspeth SQUIBB, MD;  Location: [facility] ENDOSCOPY;  Service: Gastroenterology;;   HIP ARTHROPLASTY Right 05/07/2020   Procedure: ARTHROPLASTY RIGHT HIP (HEMIARTHROPLASTY);  Surgeon: Marchia Drivers, MD;  Location: [facility] ORS;  Service: Orthopedics;  Laterality: Right;   IMAGE GUIDED SINUS SURGERY Bilateral 09/28/2015   Procedure: IMAGE GUIDED SINUS SURGERY, SEPTOPLASTY, BILATERAL FRONTAL SINUSOTOMIES, BILATERAL MAXILLARY ANTROSTOMIES, BILATERAL TOTAL ETHMOIDECTOMY, BILATERAL SPHENOIDECTOMY, BILATERAL INFERIOR TURBINATE REDUCTION;  Surgeon: Deward Argue, MD;  Location: [facility] ORS;  Service: ENT;  Laterality: Bilateral;   LEFT HEART CATH AND CORONARY ANGIOGRAPHY Left 01/19/2023   Procedure: LEFT HEART CATH AND CORONARY ANGIOGRAPHY;  Surgeon: Ammon Blunt, MD;  Location: [facility] INVASIVE CV LAB;  Service: Cardiovascular;  Laterality: Left;   POLYPECTOMY  04/19/2023   Procedure: POLYPECTOMY;  Surgeon: Leigh Elspeth SQUIBB, MD;  Location: [facility] ENDOSCOPY;  Service:  Gastroenterology;;   POLYPECTOMY  04/21/2023   Procedure: POLYPECTOMY;  Surgeon: Leigh Elspeth SQUIBB, MD;  Location: [facility] ENDOSCOPY;  Service: Gastroenterology;;   RECTOCELE REPAIR      OB History   No obstetric history on file.      Home Medications    Prior to Admission medications  Medication Sig Start Date End Date Taking? Authorizing Provider  nitrofurantoin , macrocrystal-monohydrate, (MACROBID ) 100 MG capsule Take 100 mg by mouth every 12 (twelve) hours. 06/03/24  Yes [provider]  acetaminophen  (TYLENOL ) 500 MG tablet Take 500 mg by mouth every 6 (six) hours as needed.    [provider]  albuterol  (PROVENTIL  HFA;VENTOLIN  HFA) 108 (90 BASE) MCG/ACT inhaler Inhale 2 puffs into the lungs every 6 (six) hours as needed for wheezing or shortness of breath.    [provider]  amLODipine  (NORVASC ) 5 MG tablet Take 1 tablet (5 mg total) by mouth daily. for blood pressure. 10/25/23   Clark, Katherine K, NP  carboxymethylcellulose (REFRESH PLUS) 0.5 % SOLN Place 1 drop into both eyes daily as needed (dry eyes).    [provider]  citalopram  (CELEXA ) 20 MG tablet Take 20 mg by mouth  daily. 11/15/23   [provider]  clonazePAM  (KLONOPIN ) 1 MG tablet Take 1 mg by mouth at bedtime.    [provider]  fluticasone  (FLONASE ) 50 MCG/ACT nasal spray Place into both nostrils. 09/14/23   [provider]  Fluticasone -Salmeterol (ADVAIR HFA IN) Inhale 1 puff into the lungs daily as needed.    [provider]  gabapentin  (NEURONTIN ) 600 MG tablet Take 600-1,200 mg by mouth See admin instructions. Take 1 tablet (600mg ) by mouth in the morning and 2 tablets (1200mg ) by mouth at bedtime 04/24/20   [provider]  levothyroxine  (SYNTHROID ) 125 MCG tablet Take 1 tablet by mouth every morning on an empty stomach with water only.  No food or other medications for 30 minutes. 09/18/23   Clark, Katherine K, NP  omeprazole  (PRILOSEC)  40 MG capsule Take 1 capsule (40 mg total) by mouth 2 (two) times daily before a meal. 06/23/23   Marsa Edelman, DO  oxybutynin  (DITROPAN -XL) 10 MG 24 hr tablet Take 1 tablet (10 mg total) by mouth daily. 03/28/24   Gaston Hamilton, MD  trimethoprim  (TRIMPEX ) 100 MG tablet Take 1 tablet (100 mg total) by mouth daily. 02/29/24   MacDiarmid, Scott, MD  trimethoprim -polymyxin b  (POLYTRIM ) ophthalmic solution Place 2 drops into both eyes every 6 (six) hours. Patient not taking: Reported on 06/07/2024 04/08/24   Margrette Rebbeca LABOR, PA-C    Family History Family History  Problem Relation Age of Onset   Hypertension Mother    Cancer Mother    Diabetes Father    Hypertension Father    Heart failure Father    Prostate cancer Brother    Breast cancer Neg Hx     Social History Social History[1]   Allergies   Azithromycin, Meloxicam, Morphine , Adhesive [tape], Codeine, Lactose intolerance (gi), Red dye #40 (allura red), Shellfish protein-containing drug products, Aspirin , Latex, Lodine [etodolac], Morphine  and codeine, Penicillins, and Pravastatin   Review of Systems Review of Systems  Constitutional:  Negative for chills and fever.  Gastrointestinal:  Positive for abdominal pain. Negative for blood in stool, constipation, diarrhea and vomiting.  Genitourinary:  Positive for dysuria and frequency. Negative for hematuria.     Physical Exam Triage Vital Signs ED Triage Vitals  Encounter Vitals Group     BP      Girls Systolic BP Percentile      Girls Diastolic BP Percentile      Boys Systolic BP Percentile      Boys Diastolic BP Percentile      Pulse      Resp      Temp      Temp src      SpO2      Weight      Height      Head Circumference      Peak Flow      Pain Score      Pain Loc      Pain Education      Exclude from Growth Chart    No data found.  Updated Vital Signs BP (!) 143/86   Pulse 74   Temp 97.7 F (36.5 C)   Resp 18   SpO2 99%   Visual  Acuity Right Eye Distance:   Left Eye Distance:   Bilateral Distance:    Right Eye Near:   Left Eye Near:    Bilateral Near:     Physical Exam Constitutional:      General: She is not in acute distress.  HENT:     Mouth/Throat:     Mouth: Mucous membranes are moist.  Cardiovascular:     Rate and Rhythm: Normal rate and regular rhythm.     Heart sounds: Normal heart sounds.  Pulmonary:     Effort: Pulmonary effort is normal. No respiratory distress.     Breath sounds: Normal breath sounds.  Abdominal:     General: Bowel sounds are normal.     Palpations: Abdomen is soft.     Tenderness: There is no abdominal tenderness. There is no right CVA tenderness, left CVA tenderness, guarding or rebound.  Neurological:     Mental Status: She is alert.      UC Treatments / Results  Labs (all labs ordered are listed, but only abnormal results are displayed) Labs Reviewed  POCT URINE DIPSTICK - Abnormal; Notable for the following components:      Result Value   Leukocytes, UA Trace (*)    All other components within normal limits  URINE CULTURE    EKG   Radiology No results found.  Procedures Procedures (including critical care time)  Medications Ordered in UC Medications - No data to display  Initial Impression / Assessment and Plan / UC Course  I have reviewed the triage vital signs and the nursing notes.  Pertinent labs & imaging results that were available during my care of the patient were reviewed by me and considered in my medical decision making (see chart for details).    Dysuria, lower abdominal pain.  Afebrile and vital signs are stable.  Abdomen is soft and nontender to palpation with good bowel sounds.  Urine has trace leukocytes but no nitrites.  Urine culture pending.  Patient is currently on Macrobid .  She will restart her daily trimethoprim  when she finishes the Macrobid .  Discussed that we will call if the urine culture shows the need for additional  treatment.  Instructed her to follow-up with her urologist on Monday.  ED precautions given.  She agrees to plan of care.  Final Clinical Impressions(s) / UC Diagnoses   Final diagnoses:  Dysuria  Lower abdominal pain     Discharge Instructions      The urine culture is pending.    Follow-up with your urologist on Monday.  Go to the emergency department if you have worsening symptoms.     ED Prescriptions   None    PDMP not reviewed this encounter.     [1]  Social History Tobacco Use   Smoking status: Former    Current packs/day: 0.00    Types: Cigarettes    Quit date: 2000    Years since quitting: 25.9    Passive exposure: Past   Smokeless tobacco: Never  Vaping Use   Vaping status: Never Used  Substance Use Topics   Alcohol  use: No   Drug use: No     Corlis Burnard DEL, NP 06/07/24 1529

## 2024-06-09 LAB — URINE CULTURE: Culture: NO GROWTH

## 2024-06-10 ENCOUNTER — Ambulatory Visit (HOSPITAL_COMMUNITY): Payer: Self-pay

## 2024-06-10 ENCOUNTER — Other Ambulatory Visit: Payer: Self-pay | Admitting: Primary Care

## 2024-06-10 DIAGNOSIS — E039 Hypothyroidism, unspecified: Secondary | ICD-10-CM

## 2024-06-10 NOTE — Progress Notes (Unsigned)
 06/17/2024 12:11 PM   Stacy Allen 1941-04-04 [id number]  Referring provider: Osa Geralds, NP [address] [place],  [place] [address]  Urological history: 1. Urge incontinence -contributing factors of age, GSM, HTN, sleep apnea, lumbar DDD, anxiety, obesity, benzo's, diuretics and history of smoking  2.  High risk hematuria -former smoker -contrast CT (03/2023) NED -cysto (06/2023) NED   3. Cystocele  4. Urge incontinence - Oxybutynin  XL 10 mg daily  CC: Urgent care follow up   HPI: Stacy Allen is a 83 y.o. woman who presents today for UC follow up.    Previous records reviewed.  She was seen at urgent care here in [place] on June 07, 2024 for dysuria.  She stated that she had a 2-week history of dysuria and urinary frequency.  She was taking daily trimethoprim  for prevention.  She did a telehealth visit on June 01, 2024 and was prescribed Macrobid .  She states her symptoms have not improved.  She is also having lower abdominal pain, back pain and headaches.  Urine dip was unremarkable.  Urine culture was not negative.     PMH: Past Medical History:  Diagnosis Date   Acute GI bleeding 04/18/2023   Adenomatous polyps    Allergic state    Anemia    Anginal pain    Anticoagulated 04/18/2023   Anxiety    Arthritis    osteoarthritis   Benign neoplasm of colon 04/21/2023   Broken rib    left   C. difficile diarrhea 10/21/2021   Cancer (HCC)    skin   CHF (congestive heart failure) (HCC)    Chicken pox    Complication of anesthesia    respiratory distress after rectocele surgery   Coronary artery disease    Depression    Eczema    Fibromyalgia    Gastric polyp 04/19/2023   GERD (gastroesophageal reflux disease)    GI bleed 04/18/2023   Headache    migraines   Hemorrhoids    History of hiatal hernia    Hypercalcemia    Hyperlipidemia    Hypertension    Hypothyroidism    Lumbar spondylosis 06/01/2017   Lumbar stenosis    Melena  04/19/2023   Obesity    Peptic ulcer disease    Shortness of breath dyspnea    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 Kathi, [facility], Spinal Fusion   CARDIAC CATHETERIZATION N/A 04/03/2015   Procedure: Left Heart Cath and Coronary Angiography;  Surgeon: Vinie DELENA Jude, MD;  Location: [facility] INVASIVE CV LAB;  Service: Cardiovascular;  Laterality: N/A;   CATARACT EXTRACTION W/PHACO Left 04/22/2019   Procedure: CATARACT EXTRACTION PHACO AND INTRAOCULAR LENS PLACEMENT (IOC) LEFT  00:45.4  12.9%  6.03;  Surgeon: Myrna Adine Anes, MD;  Location: [facility]GERY [facility];  Service: Ophthalmology;  Laterality: Left;  Latex sleep apnea   CATARACT EXTRACTION W/PHACO Right 07/01/2019   Procedure: CATARACT EXTRACTION PHACO AND INTRAOCULAR LENS PLACEMENT (IOC) RIGHT 2.38  00:24.4;  Surgeon: Myrna Adine Anes, MD;  Location: [facility]GERY [facility];  Service: Ophthalmology;  Laterality: Right;  Latex Sleep apnea   CHOLECYSTECTOMY     COLONOSCOPY WITH PROPOFOL  N/A 04/21/2023   Procedure: COLONOSCOPY WITH PROPOFOL ;  Surgeon: Leigh Elspeth SQUIBB, MD;  Location: [facility] ENDOSCOPY;  Service: Gastroenterology;  Laterality: N/A;   CORONARY ANGIOPLASTY  2015   Dr. Bosie, [facility]   ESOPHAGOGASTRODUODENOSCOPY (EGD) WITH PROPOFOL  N/A 12/21/2016   Procedure: ESOPHAGOGASTRODUODENOSCOPY (EGD) WITH PROPOFOL ;  Surgeon: Viktoria Lamar DASEN, MD;  Location: [facility] ENDOSCOPY;  Service: Endoscopy;  Laterality: N/A;   ESOPHAGOGASTRODUODENOSCOPY (EGD) WITH PROPOFOL  N/A 12/08/2022   Procedure: ESOPHAGOGASTRODUODENOSCOPY (EGD) WITH PROPOFOL ;  Surgeon: Onita Elspeth Sharper, DO;  Location: [facility] ENDOSCOPY;  Service: Gastroenterology;  Laterality: N/A;   ESOPHAGOGASTRODUODENOSCOPY (EGD) WITH PROPOFOL  N/A 04/19/2023   Procedure:  ESOPHAGOGASTRODUODENOSCOPY (EGD) WITH PROPOFOL ;  Surgeon: Leigh Elspeth SQUIBB, MD;  Location: [facility] ENDOSCOPY;  Service: Gastroenterology;  Laterality: N/A;   EYE SURGERY     FRACTURE SURGERY Left 1991   Fractures Femur, [facility]   GIVENS CAPSULE STUDY N/A 06/22/2023   Procedure: GIVENS CAPSULE STUDY;  Surgeon: Jinny Carmine, MD;  Location: [facility] ENDOSCOPY;  Service: Endoscopy;  Laterality: N/A;   HEMOSTASIS CLIP PLACEMENT  04/19/2023   Procedure: HEMOSTASIS CLIP PLACEMENT;  Surgeon: Leigh Elspeth SQUIBB, MD;  Location: [facility] ENDOSCOPY;  Service: Gastroenterology;;   HIP ARTHROPLASTY Right 05/07/2020   Procedure: ARTHROPLASTY RIGHT HIP (HEMIARTHROPLASTY);  Surgeon: Marchia Drivers, MD;  Location: [facility] ORS;  Service: Orthopedics;  Laterality: Right;   IMAGE GUIDED SINUS SURGERY Bilateral 09/28/2015   Procedure: IMAGE GUIDED SINUS SURGERY, SEPTOPLASTY, BILATERAL FRONTAL SINUSOTOMIES, BILATERAL MAXILLARY ANTROSTOMIES, BILATERAL TOTAL ETHMOIDECTOMY, BILATERAL SPHENOIDECTOMY, BILATERAL INFERIOR TURBINATE REDUCTION;  Surgeon: Deward Argue, MD;  Location: [facility] ORS;  Service: ENT;  Laterality: Bilateral;   LEFT HEART CATH AND CORONARY ANGIOGRAPHY Left 01/19/2023   Procedure: LEFT HEART CATH AND CORONARY ANGIOGRAPHY;  Surgeon: Ammon Blunt, MD;  Location: [facility] INVASIVE CV LAB;  Service: Cardiovascular;  Laterality: Left;   POLYPECTOMY  04/19/2023   Procedure: POLYPECTOMY;  Surgeon: Leigh Elspeth SQUIBB, MD;  Location: [facility] ENDOSCOPY;  Service: Gastroenterology;;   POLYPECTOMY  04/21/2023   Procedure: POLYPECTOMY;  Surgeon: Leigh Elspeth SQUIBB, MD;  Location: [facility] ENDOSCOPY;  Service: Gastroenterology;;   RECTOCELE REPAIR      Home Medications:  Allergies as of 06/17/2024       Reactions   Azithromycin Diarrhea, Rash   fainting Other reaction(s): Syncope  fainting    Other reaction(s): NOT KNOWN  fainting   Meloxicam Diarrhea   Morphine  Other (See Comments), Rash   Other  reaction(s): Unknown Retain urine Other reaction(s): NOT KNOWN, Other (See Comments) Retain urine Other reaction(s): side effect of severe rash   Adhesive [tape]    blisters   Codeine Nausea And Vomiting   Lactose Intolerance (gi) Other (See Comments)   stomach pain   Red Dye #40 (allura Red) Itching   red food dye   Shellfish Protein-containing Drug Products Itching, Swelling   Aspirin  Rash   Latex Rash   With extended contact   Lodine [etodolac] Rash   Morphine  And Codeine Hives, Nausea And Vomiting   Other reaction(s): UNKNOWN   Penicillins Rash, Hives   Given on 05/07/20.  No reaction noted.   Pravastatin Rash        Medication List        Accurate as of June 10, 2024 12:11 PM. If you have any questions, ask your nurse or doctor.          acetaminophen  500 MG tablet Commonly known as: TYLENOL  Take 500 mg by mouth every 6 (six) hours as needed.   ADVAIR HFA IN Inhale 1 puff into the lungs daily as  needed.   albuterol  108 (90 Base) MCG/ACT inhaler Commonly known as: VENTOLIN  HFA Inhale 2 puffs into the lungs every 6 (six) hours as needed for wheezing or shortness of breath.   amLODipine  5 MG tablet Commonly known as: NORVASC  Take 1 tablet (5 mg total) by mouth daily. for blood pressure.   carboxymethylcellulose 0.5 % Soln Commonly known as: REFRESH PLUS Place 1 drop into both eyes daily as needed (dry eyes).   citalopram  20 MG tablet Commonly known as: CELEXA  Take 20 mg by mouth daily.   clonazePAM  1 MG tablet Commonly known as: KLONOPIN  Take 1 mg by mouth at bedtime.   fluticasone  50 MCG/ACT nasal spray Commonly known as: FLONASE  Place into both nostrils.   gabapentin  600 MG tablet Commonly known as: NEURONTIN  Take 600-1,200 mg by mouth See admin instructions. Take 1 tablet (600mg ) by mouth in the morning and 2 tablets (1200mg ) by mouth at bedtime   levothyroxine  125 MCG tablet Commonly known as: SYNTHROID  Take 1 tablet by mouth  every morning on an empty stomach with water only.  No food or other medications for 30 minutes.   nitrofurantoin  (macrocrystal-monohydrate) 100 MG capsule Commonly known as: MACROBID  Take 100 mg by mouth every 12 (twelve) hours.   omeprazole  40 MG capsule Commonly known as: PRILOSEC Take 1 capsule (40 mg total) by mouth 2 (two) times daily before a meal.   oxybutynin  10 MG 24 hr tablet Commonly known as: DITROPAN -XL Take 1 tablet (10 mg total) by mouth daily.   trimethoprim  100 MG tablet Commonly known as: TRIMPEX  Take 1 tablet (100 mg total) by mouth daily.   trimethoprim -polymyxin b  ophthalmic solution Commonly known as: POLYTRIM  Place 2 drops into both eyes every 6 (six) hours.        Allergies: Allergies[1]  Family History: Family History  Problem Relation Age of Onset   Hypertension Mother    Cancer Mother    Diabetes Father    Hypertension Father    Heart failure Father    Prostate cancer Brother    Breast cancer Neg Hx     Social History:  reports that she quit smoking about 25 years ago. Her smoking use included cigarettes. She has been exposed to tobacco smoke. She has never used smokeless tobacco. She reports that she does not drink alcohol  and does not use drugs.  ROS: Pertinent ROS in HPI  Physical Exam: There were no vitals taken for this visit.  Constitutional:  Well nourished. Alert and oriented, No acute distress. HEENT: [place] AT, moist mucus membranes.  Trachea midline, no masses. Cardiovascular: No clubbing, cyanosis, or edema. Respiratory: Normal respiratory effort, no increased work of breathing. GU: No CVA tenderness.  No bladder fullness or masses.  Recession of labia minora, dry, pale vulvar vaginal mucosa and loss of mucosal ridges and folds.  Normal urethral meatus, no lesions, no prolapse, no discharge.   No urethral masses, tenderness and/or tenderness. No bladder fullness, tenderness or masses. *** vagina mucosa, *** estrogen effect, no  discharge, no lesions, *** pelvic support, *** cystocele and *** rectocele noted.  No cervical motion tenderness.  Uterus is freely mobile and non-fixed.  No adnexal/parametria masses or tenderness noted.  Anus and perineum are without rashes or lesions.   ***  Neurologic: Grossly intact, no focal deficits, moving all 4 extremities. Psychiatric: Normal mood and affect.    Laboratory Data: See Epic and HPI   I have reviewed the labs.   Pertinent Imaging: N/A  Assessment & Plan:  ***  1. Genitourinary Syndrome of Menopause (GSM)  - Explained that GSM is a common condition that affects women during and after menopause. It is characterized by a cluster of symptoms related to the genital and urinary tracts, including: vaginal dryness, pain or discomfort during intercourse (dyspareunia), burning or irritation in the vulva or vagina, thinning or loss of vaginal tissue, frequent urination, urgency (feeling the need to urinate immediately), incontinence (loss of bladder control), and pain or burning during urination - explained that GSM is primarily caused by the decline in estrogen levels during menopause. This leads to changes in the vaginal tissue, making it thinner, drier, and more susceptible to irritation. Other factors that may contribute to GSM include: Smoking, certain medications (e.g., chemotherapy drugs, and pelvic organ prolapse - advised that First-line treatment options are: Local low-dose vaginal estrogen (cream, ring, tablet), which improves dryness, irritation, dyspareunia and it is safe for most patients, including those with history of breast cancer (with multidisciplinary input). - advised they can also use vaginal moisturizers and lubricants (alone or combined) and avoid irritants and harsh cleansers.   - Pelvic floor physical therapy for dysfunction is also helpful. *** - Referral to sex therapy or counseling if psychosocial concerns are present. *** - Reassured that there is no  evidence linking local estrogen to breast or endometrial cancer - Explained that it may take up to 8 months of consistent application of the vaginal estrogen cream to cause unnecessary changes needed to address GSM and that this will be a lifelong medication - Will start with vaginal estrogen cream, applying a pea-sized amount with a fingertip just inside the vaginal introitus every night for 30 days and then continuing on to Monday, Wednesday and Friday nights - estradiol (ESTRACE) 0.01 % CREA vaginal cream, Apply one pea-sized amount around the opening of the urethra daily for 30 days, then 3 times weekly moving forward, sent to pharmacy ***      No follow-ups on file.  These notes generated with voice recognition software. I apologize for typographical errors.  CLOTILDA CORNWALL, PA-C  [facility] [address]  [address] [place], [place] [address] [phone]     [1]  Allergies Allergen Reactions   Azithromycin Diarrhea and Rash    fainting  Other reaction(s): Syncope  fainting    Other reaction(s): NOT KNOWN  fainting   Meloxicam Diarrhea   Morphine  Other (See Comments) and Rash    Other reaction(s): Unknown Retain urine Other reaction(s): NOT KNOWN, Other (See Comments) Retain urine Other reaction(s): side effect of severe rash   Adhesive [Tape]     blisters   Codeine Nausea And Vomiting   Lactose Intolerance (Gi) Other (See Comments)    stomach pain   Red Dye #40 (Allura Red) Itching    red food dye   Shellfish Protein-Containing Drug Products Itching and Swelling   Aspirin  Rash   Latex Rash    With extended contact   Lodine [Etodolac] Rash   Morphine  And Codeine Hives and Nausea And Vomiting    Other reaction(s): UNKNOWN   Penicillins Rash and Hives    Given on 05/07/20.  No reaction noted.   Pravastatin Rash

## 2024-06-12 ENCOUNTER — Inpatient Hospital Stay: Attending: Oncology

## 2024-06-12 DIAGNOSIS — D509 Iron deficiency anemia, unspecified: Secondary | ICD-10-CM | POA: Diagnosis present

## 2024-06-12 LAB — IRON AND TIBC
Iron: 62 ug/dL (ref 28–170)
Saturation Ratios: 20 % (ref 10.4–31.8)
TIBC: 316 ug/dL (ref 250–450)
UIBC: 255 ug/dL

## 2024-06-12 LAB — CBC (CANCER CENTER ONLY)
HCT: 37.6 % (ref 36.0–46.0)
Hemoglobin: 12.6 g/dL (ref 12.0–15.0)
MCH: 30.1 pg (ref 26.0–34.0)
MCHC: 33.5 g/dL (ref 30.0–36.0)
MCV: 90 fL (ref 80.0–100.0)
Platelet Count: 295 K/uL (ref 150–400)
RBC: 4.18 MIL/uL (ref 3.87–5.11)
RDW: 14.8 % (ref 11.5–15.5)
WBC Count: 7.1 K/uL (ref 4.0–10.5)
nRBC: 0 % (ref 0.0–0.2)

## 2024-06-12 LAB — FERRITIN: Ferritin: 189 ng/mL (ref 11–307)

## 2024-06-17 ENCOUNTER — Encounter: Payer: Self-pay | Admitting: Urology

## 2024-06-17 ENCOUNTER — Ambulatory Visit: Admitting: Urology

## 2024-06-17 VITALS — BP 150/89 | HR 50 | Ht 60.0 in | Wt 190.0 lb

## 2024-06-17 DIAGNOSIS — N958 Other specified menopausal and perimenopausal disorders: Secondary | ICD-10-CM | POA: Diagnosis not present

## 2024-06-17 LAB — URINALYSIS, COMPLETE
Bilirubin, UA: NEGATIVE
Glucose, UA: NEGATIVE
Nitrite, UA: NEGATIVE
Protein,UA: NEGATIVE
RBC, UA: NEGATIVE
Specific Gravity, UA: 1.01 (ref 1.005–1.030)
Urobilinogen, Ur: 4 mg/dL — ABNORMAL HIGH (ref 0.2–1.0)
pH, UA: 6.5 (ref 5.0–7.5)

## 2024-06-17 LAB — MICROSCOPIC EXAMINATION

## 2024-06-17 MED ORDER — PREMARIN 0.625 MG/GM VA CREA: 1.0000 | TOPICAL_CREAM | Freq: Every day | VAGINAL | 12 refills | Status: AC

## 2024-06-18 ENCOUNTER — Ambulatory Visit: Admitting: Clinical

## 2024-06-18 DIAGNOSIS — F419 Anxiety disorder, unspecified: Secondary | ICD-10-CM | POA: Diagnosis not present

## 2024-06-18 DIAGNOSIS — F330 Major depressive disorder, recurrent, mild: Secondary | ICD-10-CM

## 2024-06-18 NOTE — Progress Notes (Signed)
   Darice Seats, LCSW

## 2024-06-18 NOTE — Progress Notes (Signed)
 "  [facility] Counselor/Therapist Progress Note  Patient ID: Stacy Allen, MRN: [id number],    Date: 06/18/2024  Time Spent: 1:35pm - 2:42pm : 67 minutes   Treatment Type: Individual Therapy  Reported Symptoms: depressed mood, fatigue, anger  Mental Status Exam: Appearance:  Neat and Well Groomed     Behavior: Appropriate  Motor: Normal  Speech/Language:  Clear and Coherent and Normal Rate  Affect: Appropriate  Mood: depressed  Thought process: normal  Thought content:   WNL  Sensory/Perceptual disturbances:   WNL  Orientation: oriented to person, place, time/date, and situation  Attention: Good  Concentration: Good  Memory: WNL  Fund of knowledge:  Good  Insight:   Good  Judgment:  Good  Impulse Control: Good   Risk Assessment: Danger to Self:  No Patient denied current suicidal ideation  Self-injurious Behavior: No Danger to Others: No Patient denied current homicidal ideation Duty to Warn:no Physical Aggression / Violence:No  Access to Firearms a concern: No  Gang Involvement:No   Subjective:  Patient stated, I have been sick and reported a UTI since last session.  Patient stated, I was in bed for thanksgiving and reported due to UTI. Patient reported fatigue and stated, I've been real bad with seasonal sadness and the anger stuff. Patient stated, I've kept it totally under control in reference to anger. Patient stated, It was extreme during thanksgiving in reference to sadness. Patient stated, I can not accept family as family in reference to sister's immediate family. Patient reported patient considers family as patient's daughter, grandchildren, great grandchildren. Patient stated, I feel in the way with that family and reported feeling the need to make an appointment to visit sister's family. Patient reported patient's niece has stated patient is welcome to visit without asking prior to visit. Patient stated, I would think ok, you  haven't been able to participate or go for your walks but you are able to get up and go to the restroom, bathe yourself off and fix coffee in reference to identifying a positive for each negative thought. Patient stated, I've done a lot of thinking positives. Patient stated, it has, keeping busy in reference to positive impact of reframing thoughts on feelings of sadness. Patient stated, I've been trying to be positive, I haven't been as faithful as I should to write things down in reference to thought record. Patient reported plans to join the [employer].   Interventions: Cognitive Behavioral Therapy. Clinician conducted session in person at clinician's office at [facility]. Reviewed events since last session and assessed for changes. Discussed recent feelings of sadness and anger. Explored patient's perception as it relates to family. Challenged statements/thoughts to assist patient in reframing negative thought patterns associated with visiting family. Explored connection between depressed mood and patient's thoughts and behaviors. Assisted patient in generating alternatives. Reviewed patient's homework and reviewed reframing negative thoughts into positive thoughts. Provided psycho education related to depressive symptoms and connection between situations, thoughts, feelings, and behaviors. Provided psycho education related to benefits of socialization and positive activities. Clinician requested for homework patient continue thought record and practice identifying a positive thought for each negative thought.    Collaboration of Care: not required at this time   Diagnosis:  Mild episode of recurrent major depressive disorder (HCC)   Anxiety disorder, unspecified type     Plan: Patient is to utilize [employer], thought re-framing, mindfulness and coping strategies to decrease symptoms associated with their diagnosis. Frequency: bi-weekly  Modality: individual  Long-term goal:   Patient stated, I guess I want to see my being able to recognize and appreciate what I have instead of focusing on all I've lost.  Target Date: 11/30/24  Progress: goal has been met    Short-term goal:  Write at least one positive affirmation about herself and/or patient's environment each day Target Date: 11/30/24  Progress: progressing    Practice gratitude exercises to decrease symptoms of depression Target Date: 11/30/24  Progress: progressing    Identify, challenge, and replace negative thought patterns that contribute to feelings of depression and anxiety with positive thoughts and beliefs per patient's report Target Date: 11/30/24  Progress: progressing    Develop and implement strategies to increase self care, such as, participating in water exercise class, accepting invitations to social functions Target Date: 11/30/24  Progress: progressing    verbalize positive statements regarding self and her ability to cope with life stressors Target Date: 11/30/24  Progress: progressing     Darice Seats, LCSW    "

## 2024-07-09 ENCOUNTER — Other Ambulatory Visit: Payer: Self-pay

## 2024-07-09 MED ORDER — TRIMETHOPRIM 100 MG PO TABS: 100.0000 mg | ORAL_TABLET | Freq: Every day | ORAL | 11 refills | Status: AC

## 2024-07-17 NOTE — Progress Notes (Signed)
 "    07/18/2024 1:01 PM   Stacy Allen 09-02-1940 [id number]  Referring provider: Osa Geralds, NP [address] [place],  [place] [address]  Urological history: 1. Urge incontinence -contributing factors of age, GSM, HTN, sleep apnea, lumbar DDD, anxiety, obesity, benzo's, diuretics and history of smoking  2.  High risk hematuria -former smoker -contrast CT (03/2023) NED -cysto (06/2023) NED   3. Cystocele  4. Urge incontinence - Oxybutynin  XL 10 mg daily  CC: Urgent care follow up   HPI: Stacy Allen is a 84 y.o. woman who presents today for 1 month follow-up after starting vaginal estrogen cream for symptoms of GSM with her niece, Berwyn.  Previous records reviewed.  She was seen at a [facility] urgent care in [place] on December 12 for symptoms of dysuria, nocturia and urinary frequency after she saw no improvement with prescribed Macrobid  on a telehealth visit.  She also tried Monistat over-the-counter to address her symptoms, but it caused severe burning.  Urine culture from the urgent care visit was negative.  When I saw her on December 22, she was back on the trimethoprim  and still having dysuria and some variable nocturia.  Pelvic exam at that visit was consistent with vaginal atrophy and urethral caruncle.  I restarted her on her vaginal estrogen cream.    She is having 1-7 daytime voids, 1-2 episodes of nocturia with a variable strong to severe urge to urinate.  She has stress urinary incontinence leaking 1-2 times a week.  She does not wear any absorbent products for urinary leakage.  She does not limit fluid intake.  And she sometimes engages in toilet mapping.  She continues to have burning, but she admits she is not certain as to whether or not it is an interstitial cystitis flare or a UTI.  Her niece states that she has not been as consistent with the vaginal estrogen cream.  She is also taking the trimethoprim  100 mg daily.  She continues to take the oxybutynin   XL 10 mg daily.  She is having good water intake drinking 8-10 of the smaller bottles of water daily.  Patient denies any modifying or aggravating factors.  Patient denies any recent UTI's, gross hematuria, dysuria or suprapubic/flank pain.  Patient denies any fevers, chills, nausea or vomiting.    UA yellow slightly cloudy, specific gravity 1.020, pH 6.5, trace protein, 1+ bilirubin, 1+ leukocyte, 11-30 WBCs, 0-2 RBCs, greater than 10 epithelial cells, mucus threads present, and many bacteria.  PVR 42 mL   PMH: Past Medical History:  Diagnosis Date   Acute GI bleeding 04/18/2023   Adenomatous polyps    Allergic state    Anemia    Anginal pain    Anticoagulated 04/18/2023   Anxiety    Arthritis    osteoarthritis   Benign neoplasm of colon 04/21/2023   Broken rib    left   C. difficile diarrhea 10/21/2021   Cancer (HCC)    skin   CHF (congestive heart failure) (HCC)    Chicken pox    Complication of anesthesia    respiratory distress after rectocele surgery   Coronary artery disease    Depression    Eczema    Fibromyalgia    Gastric polyp 04/19/2023   GERD (gastroesophageal reflux disease)    GI bleed 04/18/2023   Headache    migraines   Hemorrhoids    History of hiatal hernia    Hypercalcemia    Hyperlipidemia    Hypertension  Hypothyroidism    Lumbar spondylosis 06/01/2017   Lumbar stenosis    Melena 04/19/2023   Obesity    Peptic ulcer disease    Shortness of breath dyspnea    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 Kathi, [facility], Spinal Fusion   CARDIAC CATHETERIZATION N/A 04/03/2015   Procedure: Left Heart Cath and Coronary Angiography;  Surgeon: Vinie DELENA Jude, MD;  Location: [facility] INVASIVE CV LAB;  Service: Cardiovascular;  Laterality: N/A;    CATARACT EXTRACTION W/PHACO Left 04/22/2019   Procedure: CATARACT EXTRACTION PHACO AND INTRAOCULAR LENS PLACEMENT (IOC) LEFT  00:45.4  12.9%  6.03;  Surgeon: Myrna Adine Anes, MD;  Location: [facility];  Service: Ophthalmology;  Laterality: Left;  Latex sleep apnea   CATARACT EXTRACTION W/PHACO Right 07/01/2019   Procedure: CATARACT EXTRACTION PHACO AND INTRAOCULAR LENS PLACEMENT (IOC) RIGHT 2.38  00:24.4;  Surgeon: Myrna Adine Anes, MD;  Location: [facility]GERY [facility];  Service: Ophthalmology;  Laterality: Right;  Latex Sleep apnea   CHOLECYSTECTOMY     COLONOSCOPY WITH PROPOFOL  N/A 04/21/2023   Procedure: COLONOSCOPY WITH PROPOFOL ;  Surgeon: Leigh Elspeth SQUIBB, MD;  Location: [facility] ENDOSCOPY;  Service: Gastroenterology;  Laterality: N/A;   CORONARY ANGIOPLASTY  2015   Dr. Jude, [facility] Cath Lab   ESOPHAGOGASTRODUODENOSCOPY (EGD) WITH PROPOFOL  N/A 12/21/2016   Procedure: ESOPHAGOGASTRODUODENOSCOPY (EGD) WITH PROPOFOL ;  Surgeon: Viktoria Lamar DASEN, MD;  Location: [facility] ENDOSCOPY;  Service: Endoscopy;  Laterality: N/A;   ESOPHAGOGASTRODUODENOSCOPY (EGD) WITH PROPOFOL  N/A 12/08/2022   Procedure: ESOPHAGOGASTRODUODENOSCOPY (EGD) WITH PROPOFOL ;  Surgeon: Onita Elspeth Sharper, DO;  Location: [facility] ENDOSCOPY;  Service: Gastroenterology;  Laterality: N/A;   ESOPHAGOGASTRODUODENOSCOPY (EGD) WITH PROPOFOL  N/A 04/19/2023   Procedure: ESOPHAGOGASTRODUODENOSCOPY (EGD) WITH PROPOFOL ;  Surgeon: Leigh Elspeth SQUIBB, MD;  Location: [facility] ENDOSCOPY;  Service: Gastroenterology;  Laterality: N/A;   EYE SURGERY     FRACTURE SURGERY Left 1991   Fractures Femur, [facility]   GIVENS CAPSULE STUDY N/A 06/22/2023   Procedure: GIVENS CAPSULE STUDY;  Surgeon: Jinny Carmine, MD;  Location: [facility] ENDOSCOPY;  Service: Endoscopy;  Laterality: N/A;   HEMOSTASIS CLIP PLACEMENT  04/19/2023   Procedure: HEMOSTASIS CLIP PLACEMENT;  Surgeon: Leigh Elspeth SQUIBB, MD;  Location: [facility] ENDOSCOPY;  Service: Gastroenterology;;    HIP ARTHROPLASTY Right 05/07/2020   Procedure: ARTHROPLASTY RIGHT HIP (HEMIARTHROPLASTY);  Surgeon: Marchia Drivers, MD;  Location: [facility] ORS;  Service: Orthopedics;  Laterality: Right;   IMAGE GUIDED SINUS SURGERY Bilateral 09/28/2015   Procedure: IMAGE GUIDED SINUS SURGERY, SEPTOPLASTY, BILATERAL FRONTAL SINUSOTOMIES, BILATERAL MAXILLARY ANTROSTOMIES, BILATERAL TOTAL ETHMOIDECTOMY, BILATERAL SPHENOIDECTOMY, BILATERAL INFERIOR TURBINATE REDUCTION;  Surgeon: Deward Argue, MD;  Location: [facility] ORS;  Service: ENT;  Laterality: Bilateral;   LEFT HEART CATH AND CORONARY ANGIOGRAPHY Left 01/19/2023   Procedure: LEFT HEART CATH AND CORONARY ANGIOGRAPHY;  Surgeon: Ammon Blunt, MD;  Location: [facility] INVASIVE CV LAB;  Service: Cardiovascular;  Laterality: Left;   POLYPECTOMY  04/19/2023   Procedure: POLYPECTOMY;  Surgeon: Leigh Elspeth SQUIBB, MD;  Location: [facility] ENDOSCOPY;  Service: Gastroenterology;;   POLYPECTOMY  04/21/2023   Procedure: POLYPECTOMY;  Surgeon: Leigh Elspeth SQUIBB, MD;  Location: [facility] ENDOSCOPY;  Service: Gastroenterology;;   RECTOCELE REPAIR      Home Medications:  Allergies as of 07/18/2024  Reactions   Azithromycin Diarrhea, Rash   fainting Other reaction(s): Syncope  fainting    Other reaction(s): NOT KNOWN  fainting   Meloxicam Diarrhea   Morphine  Other (See Comments), Rash   Other reaction(s): Unknown Retain urine Other reaction(s): NOT KNOWN, Other (See Comments) Retain urine Other reaction(s): side effect of severe rash   Adhesive [tape]    blisters   Codeine Nausea And Vomiting   Lactose Intolerance (gi) Other (See Comments)   stomach pain   Red Dye #40 (allura Red) Itching   red food dye   Shellfish Protein-containing Drug Products Itching, Swelling   Aspirin  Rash   Latex Rash   With extended contact   Lodine [etodolac] Rash   Morphine  And Codeine Hives, Nausea And Vomiting   Other reaction(s): UNKNOWN   Penicillins Rash, Hives   Given  on 05/07/20.  No reaction noted.   Pravastatin Rash        Medication List        Accurate as of July 18, 2024 11:59 PM. If you have any questions, ask your nurse or doctor.          acetaminophen  500 MG tablet Commonly known as: TYLENOL  Take 500 mg by mouth every 6 (six) hours as needed.   adapalene 0.1 % cream Commonly known as: DIFFERIN SMARTSIG:sparingly Topical Every Night   ADVAIR HFA IN Inhale 1 puff into the lungs daily as needed.   albuterol  108 (90 Base) MCG/ACT inhaler Commonly known as: VENTOLIN  HFA Inhale 2 puffs into the lungs every 6 (six) hours as needed for wheezing or shortness of breath.   amLODipine  5 MG tablet Commonly known as: NORVASC  Take 1 tablet (5 mg total) by mouth daily. for blood pressure.   amLODipine  10 MG tablet Commonly known as: NORVASC  Take 10 mg by mouth daily.   Benzoyl Peroxide 2.5 % gel SMARTSIG:1 sparingly Topical Every Morning   carboxymethylcellulose 0.5 % Soln Commonly known as: REFRESH PLUS Place 1 drop into both eyes daily as needed (dry eyes).   CeleBREX 100 MG capsule Generic drug: celecoxib Take 100 mg by mouth 2 (two) times daily as needed.   citalopram  20 MG tablet Commonly known as: CELEXA  Take 20 mg by mouth daily.   clonazePAM  1 MG tablet Commonly known as: KLONOPIN  Take 1 mg by mouth at bedtime.   EPINEPHrine  0.3 mg/0.3 mL Soaj injection Commonly known as: EPI-PEN Inject into the muscle.   erythromycin ophthalmic ointment SMARTSIG:sparingly In Eye(s) Twice Daily   fluorouracil 5 % cream Commonly known as: EFUDEX Apply twice daily for four weeks.   fluticasone  50 MCG/ACT nasal spray Commonly known as: FLONASE  Place into both nostrils.   gabapentin  600 MG tablet Commonly known as: NEURONTIN  Take 600-1,200 mg by mouth See admin instructions. Take 1 tablet (600mg ) by mouth in the morning and 2 tablets (1200mg ) by mouth at bedtime   levothyroxine  125 MCG tablet Commonly known as:  SYNTHROID  Take 1 tablet by mouth every morning on an empty stomach with water only.  No food or other medications for 30 minutes.   nitrofurantoin  (macrocrystal-monohydrate) 100 MG capsule Commonly known as: MACROBID  Take 100 mg by mouth every 12 (twelve) hours.   omeprazole  40 MG capsule Commonly known as: PRILOSEC Take 1 capsule (40 mg total) by mouth 2 (two) times daily before a meal.   oxybutynin  10 MG 24 hr tablet Commonly known as: DITROPAN -XL Take 1 tablet (10 mg total) by mouth daily.   predniSONE  5 MG tablet Commonly  known as: DELTASONE  Take as directed - 6 day taper   Premarin  vaginal cream Generic drug: conjugated estrogens  Place 1 Applicatorful vaginally daily. Apply 0.5mg  (pea-sized amount)  just inside the vaginal introitus with a finger-tip on for 30 days and then Monday, Wednesday and Friday nights.   tacrolimus 0.1 % ointment Commonly known as: PROTOPIC SMARTSIG:sparingly Topical 1-2 Times Daily   tretinoin 0.1 % cream Commonly known as: RETIN-A APPLY 1 A SMALL AMOUNT TO AFFECTED AREA EVERY NIGHT AT BEDTIME TO FACE   triamcinolone  ointment 0.1 % Commonly known as: KENALOG Apply topically.   trimethoprim  100 MG tablet Commonly known as: TRIMPEX  Take 1 tablet (100 mg total) by mouth daily.   trimethoprim -polymyxin b  ophthalmic solution Commonly known as: POLYTRIM  Place 2 drops into both eyes every 6 (six) hours.        Allergies: Allergies[1]  Family History: Family History  Problem Relation Age of Onset   Hypertension Mother    Cancer Mother    Diabetes Father    Hypertension Father    Heart failure Father    Prostate cancer Brother    Breast cancer Neg Hx     Social History:  reports that she quit smoking about 26 years ago. Her smoking use included cigarettes. She has been exposed to tobacco smoke. She has never used smokeless tobacco. She reports that she does not drink alcohol  and does not use drugs.  ROS: Pertinent ROS in  HPI  Physical Exam: BP 110/70   Pulse (!) 105   Wt 190 lb (86.2 kg)   SpO2 96%   BMI 37.11 kg/m   Constitutional:  Well nourished. Alert and oriented, No acute distress. HEENT: [place] AT, moist mucus membranes.  Trachea midline Cardiovascular: No clubbing, cyanosis, or edema. Respiratory: Normal respiratory effort, no increased work of breathing. Neurologic: Grossly intact, no focal deficits, moving all 4 extremities. Psychiatric: Normal mood and affect.    Laboratory Data: See Epic and HPI   I have reviewed the labs.   Pertinent Imaging:  07/18/24 11:21  Scan Result 42mL    Assessment & Plan:    1. Genitourinary Syndrome of Menopause (GSM)  - Encouraged her to use the vaginal estrogen cream consistently by applying it 3 times weekly - Explained that will take several weeks to months with consistent use to see the necessary changes in the vaginal mucosa to address symptoms of GSM  2.  Interstitial cystitis versus UTI - UA w/ > 10 squames, so likely colonized - urine culture sent per patient and niece's request in case she develops symptoms of an UTI - she will continue her trimethoprim  100 mg daily  Return for keep follow up with Dr. Gaston in September .  These notes generated with voice recognition software. I apologize for typographical errors.  CLOTILDA CORNWALL, PA-C  [facility] [address]  [address] [place], [place] [address] [phone]     [1]  Allergies Allergen Reactions   Azithromycin Diarrhea and Rash    fainting  Other reaction(s): Syncope  fainting    Other reaction(s): NOT KNOWN  fainting   Meloxicam Diarrhea   Morphine  Other (See Comments) and Rash    Other reaction(s): Unknown Retain urine Other reaction(s): NOT KNOWN, Other (See Comments) Retain urine Other reaction(s): side effect of severe rash   Adhesive [Tape]     blisters   Codeine Nausea And Vomiting   Lactose Intolerance (Gi) Other  (See Comments)    stomach pain  Red Dye #40 (Allura Red) Itching    red food dye   Shellfish Protein-Containing Drug Products Itching and Swelling   Aspirin  Rash   Latex Rash    With extended contact   Lodine [Etodolac] Rash   Morphine  And Codeine Hives and Nausea And Vomiting    Other reaction(s): UNKNOWN   Penicillins Rash and Hives    Given on 05/07/20.  No reaction noted.   Pravastatin Rash   "

## 2024-07-18 ENCOUNTER — Ambulatory Visit: Admitting: Urology

## 2024-07-18 VITALS — BP 110/70 | HR 105 | Wt 190.0 lb

## 2024-07-18 DIAGNOSIS — R351 Nocturia: Secondary | ICD-10-CM | POA: Diagnosis not present

## 2024-07-18 DIAGNOSIS — N958 Other specified menopausal and perimenopausal disorders: Secondary | ICD-10-CM

## 2024-07-18 LAB — URINALYSIS, COMPLETE
Glucose, UA: NEGATIVE
Ketones, UA: NEGATIVE
Nitrite, UA: NEGATIVE
RBC, UA: NEGATIVE
Specific Gravity, UA: 1.02 (ref 1.005–1.030)
Urobilinogen, Ur: 1 mg/dL (ref 0.2–1.0)
pH, UA: 6.5 (ref 5.0–7.5)

## 2024-07-18 LAB — BLADDER SCAN AMB NON-IMAGING

## 2024-07-18 LAB — MICROSCOPIC EXAMINATION: Epithelial Cells (non renal): >10 /HPF — AB (ref 0–10)

## 2024-07-19 NOTE — Telephone Encounter (Signed)
 Pt left message on triage line stating that she is requesting the results of her UA and questions if she needs antibiotics. Please advise.

## 2024-07-20 ENCOUNTER — Encounter: Payer: Self-pay | Admitting: Urology

## 2024-07-20 MED ORDER — NITROFURANTOIN MONOHYD MACRO 100 MG PO CAPS: 100.0000 mg | ORAL_CAPSULE | Freq: Two times a day (BID) | ORAL | 0 refills | Status: AC

## 2024-07-22 ENCOUNTER — Ambulatory Visit: Payer: Self-pay | Admitting: Urology

## 2024-07-22 LAB — CULTURE, URINE COMPREHENSIVE

## 2024-07-30 ENCOUNTER — Encounter: Payer: Self-pay | Admitting: Oncology

## 2024-07-31 ENCOUNTER — Ambulatory Visit: Admitting: Clinical

## 2024-07-31 DIAGNOSIS — F419 Anxiety disorder, unspecified: Secondary | ICD-10-CM

## 2024-07-31 DIAGNOSIS — F330 Major depressive disorder, recurrent, mild: Secondary | ICD-10-CM | POA: Diagnosis not present

## 2024-07-31 NOTE — Progress Notes (Signed)
 "  [facility] Counselor/Therapist Progress Note  Patient ID: Stacy Allen, MRN: [id number],    Date: 07/31/2024  Time Spent: 12:36pm - 1:27pm : 51 minutes   Treatment Type: Individual Therapy  Reported Symptoms: Patient reported recently feeling overwhelmed  Mental Status Exam: Appearance:  Neat and Well Groomed     Behavior: Appropriate  Motor: Normal  Speech/Language:  Clear and Coherent and Normal Rate  Affect: Appropriate  Mood: Patient stated, I'm doing ok in response to current mood.   Thought process: normal  Thought content:   WNL  Sensory/Perceptual disturbances:   WNL  Orientation: oriented to person, place, time/date, and situation  Attention: Good  Concentration: Good  Memory: WNL  Fund of knowledge:  Good  Insight:   Good  Judgment:  Good  Impulse Control: Good   Risk Assessment: Danger to Self:  No Patient denied current suicidal ideation  Self-injurious Behavior: No Danger to Others: No Patient denied current homicidal ideation Duty to Warn:no Physical Aggression / Violence:No  Access to Firearms a concern: No  Gang Involvement:No   Subjective: Patient reported isolation due to recent winter weather. Patient reported patient's daughter recently visited to attend grandson's high school graduation. Patient reported patient's daughter visited patient during visit to [facility] and patient stated, that part was good. Patient reported worry related to patient's lab results and concern related to patient's white cell count. Patient reported patient has an appointment with patient's hematologist in march. Patient reported grandson's graduation and daughter's visit were positive events since last session. Patient stated, it broke my heart that she (daughter) was no longer down the road in reference to daughter returning home. Patient reported worry regarding daughter's flight home. Patient reported feeling doomed and overwhelmed during recent doctor's  appointment. Patient reported the following thought associated with recent doctor's appointment and feeling overwhelmed: why aren't my kidneys sending it on.   Interventions: Cognitive Behavioral Therapy. Clinician conducted session via caregility video from clinician's home office. Patient provided verbal consent to proceed with telehealth session and is aware of limitations of telephone or video visits. Patient participated in session from patient's home.  Reviewed events since last session and assessed for changes. Explored feelings of worry related to lab results. Discussed following up with medical provider to discuss concerns related to lab work. Reviewed positive events since last session. Explored and identified feelings triggered by daughter's travel home. Explored and identified thoughts triggered by doctor's appointment. Clinician reflected back to patient observations in thought patterns/cognitive distortions, such as, catastrophizing. Clinician requested for homework patient continue thought record and practice identifying a positive thought for each negative thought.     Collaboration of Care: not required at this time   Diagnosis:  Mild episode of recurrent major depressive disorder (HCC)   Anxiety disorder, unspecified type     Plan: Patient is to utilize [employer], thought re-framing, mindfulness and coping strategies to decrease symptoms associated with their diagnosis. Frequency: bi-weekly  Modality: individual      Long-term goal:   Patient stated, I guess I want to see my being able to recognize and appreciate what I have instead of focusing on all I've lost.  Target Date: 11/30/24  Progress: goal has been met    Short-term goal:  Write at least one positive affirmation about herself and/or patient's environment each day Target Date: 11/30/24  Progress: progressing    Practice gratitude exercises to decrease symptoms of depression Target Date:  11/30/24  Progress: progressing  Identify, challenge, and replace negative thought patterns that contribute to feelings of depression and anxiety with positive thoughts and beliefs per patient's report Target Date: 11/30/24  Progress: progressing    Develop and implement strategies to increase self care, such as, participating in water exercise class, accepting invitations to social functions Target Date: 11/30/24  Progress: progressing    verbalize positive statements regarding self and her ability to cope with life stressors Target Date: 11/30/24  Progress: progressing     Darice Seats, LCSW    "

## 2024-07-31 NOTE — Progress Notes (Signed)
   Darice Seats, LCSW

## 2024-08-23 ENCOUNTER — Ambulatory Visit

## 2024-09-04 ENCOUNTER — Ambulatory Visit: Admitting: Clinical

## 2024-09-11 ENCOUNTER — Ambulatory Visit: Admitting: Oncology

## 2024-09-11 ENCOUNTER — Other Ambulatory Visit

## 2025-03-10 ENCOUNTER — Ambulatory Visit: Admitting: Urology
